# Patient Record
Sex: Female | Born: 1945 | Race: White | Hispanic: No | Marital: Single | Smoking: Never smoker
Health system: Southern US, Community
[De-identification: ages and names within clinical notes are randomized; demographics above are authoritative.]

## PROBLEM LIST (undated history)

## (undated) DIAGNOSIS — I2510 Atherosclerotic heart disease of native coronary artery without angina pectoris: Secondary | ICD-10-CM

## (undated) DIAGNOSIS — I10 Essential (primary) hypertension: Secondary | ICD-10-CM

## (undated) DIAGNOSIS — F411 Generalized anxiety disorder: Secondary | ICD-10-CM

## (undated) DIAGNOSIS — F329 Major depressive disorder, single episode, unspecified: Secondary | ICD-10-CM

## (undated) DIAGNOSIS — G4733 Obstructive sleep apnea (adult) (pediatric): Secondary | ICD-10-CM

## (undated) DIAGNOSIS — N189 Chronic kidney disease, unspecified: Secondary | ICD-10-CM

## (undated) DIAGNOSIS — F4310 Post-traumatic stress disorder, unspecified: Secondary | ICD-10-CM

## (undated) DIAGNOSIS — E669 Obesity, unspecified: Secondary | ICD-10-CM

## (undated) DIAGNOSIS — M1990 Unspecified osteoarthritis, unspecified site: Secondary | ICD-10-CM

## (undated) DIAGNOSIS — Z8659 Personal history of other mental and behavioral disorders: Secondary | ICD-10-CM

## (undated) DIAGNOSIS — N2889 Other specified disorders of kidney and ureter: Secondary | ICD-10-CM

## (undated) DIAGNOSIS — F419 Anxiety disorder, unspecified: Secondary | ICD-10-CM

## (undated) DIAGNOSIS — R0600 Dyspnea, unspecified: Secondary | ICD-10-CM

## (undated) DIAGNOSIS — I493 Ventricular premature depolarization: Secondary | ICD-10-CM

## (undated) DIAGNOSIS — R112 Nausea with vomiting, unspecified: Secondary | ICD-10-CM

## (undated) DIAGNOSIS — Z9289 Personal history of other medical treatment: Secondary | ICD-10-CM

## (undated) DIAGNOSIS — G2581 Restless legs syndrome: Secondary | ICD-10-CM

## (undated) DIAGNOSIS — I4719 Other supraventricular tachycardia: Secondary | ICD-10-CM

## (undated) DIAGNOSIS — Z8742 Personal history of other diseases of the female genital tract: Secondary | ICD-10-CM

## (undated) DIAGNOSIS — R413 Other amnesia: Secondary | ICD-10-CM

## (undated) DIAGNOSIS — N751 Abscess of Bartholin's gland: Secondary | ICD-10-CM

## (undated) DIAGNOSIS — H8120 Vestibular neuronitis, unspecified ear: Secondary | ICD-10-CM

## (undated) DIAGNOSIS — M797 Fibromyalgia: Secondary | ICD-10-CM

## (undated) DIAGNOSIS — I471 Supraventricular tachycardia: Secondary | ICD-10-CM

## (undated) DIAGNOSIS — E039 Hypothyroidism, unspecified: Secondary | ICD-10-CM

## (undated) DIAGNOSIS — R7303 Prediabetes: Secondary | ICD-10-CM

## (undated) DIAGNOSIS — Z8709 Personal history of other diseases of the respiratory system: Secondary | ICD-10-CM

## (undated) DIAGNOSIS — I499 Cardiac arrhythmia, unspecified: Secondary | ICD-10-CM

## (undated) DIAGNOSIS — F32A Depression, unspecified: Secondary | ICD-10-CM

## (undated) DIAGNOSIS — R42 Dizziness and giddiness: Secondary | ICD-10-CM

## (undated) DIAGNOSIS — G43909 Migraine, unspecified, not intractable, without status migrainosus: Secondary | ICD-10-CM

## (undated) DIAGNOSIS — T41205A Adverse effect of unspecified general anesthetics, initial encounter: Secondary | ICD-10-CM

## (undated) DIAGNOSIS — Z9889 Other specified postprocedural states: Secondary | ICD-10-CM

## (undated) DIAGNOSIS — C679 Malignant neoplasm of bladder, unspecified: Secondary | ICD-10-CM

## (undated) DIAGNOSIS — F909 Attention-deficit hyperactivity disorder, unspecified type: Secondary | ICD-10-CM

## (undated) DIAGNOSIS — K219 Gastro-esophageal reflux disease without esophagitis: Secondary | ICD-10-CM

## (undated) DIAGNOSIS — F319 Bipolar disorder, unspecified: Secondary | ICD-10-CM

## (undated) DIAGNOSIS — E785 Hyperlipidemia, unspecified: Secondary | ICD-10-CM

## (undated) HISTORY — PX: TUBAL LIGATION: SHX77

## (undated) HISTORY — DX: Personal history of other diseases of the female genital tract: Z87.42

## (undated) HISTORY — DX: Supraventricular tachycardia: I47.1

## (undated) HISTORY — DX: Atherosclerotic heart disease of native coronary artery without angina pectoris: I25.10

## (undated) HISTORY — PX: CARDIAC ELECTROPHYSIOLOGY MAPPING AND ABLATION: SHX1292

## (undated) HISTORY — DX: Personal history of other medical treatment: Z92.89

## (undated) HISTORY — DX: Personal history of other diseases of the respiratory system: Z87.09

## (undated) HISTORY — DX: Obstructive sleep apnea (adult) (pediatric): G47.33

## (undated) HISTORY — DX: Hyperlipidemia, unspecified: E78.5

## (undated) HISTORY — DX: Major depressive disorder, single episode, unspecified: F32.9

## (undated) HISTORY — DX: Other amnesia: R41.3

## (undated) HISTORY — DX: Other specified disorders of kidney and ureter: N28.89

## (undated) HISTORY — DX: Migraine, unspecified, not intractable, without status migrainosus: G43.909

## (undated) HISTORY — PX: TONSILLECTOMY: SHX5217

## (undated) HISTORY — DX: Ventricular premature depolarization: I49.3

## (undated) HISTORY — DX: Malignant neoplasm of bladder, unspecified: C67.9

## (undated) HISTORY — DX: Vestibular neuronitis, unspecified ear: H81.20

## (undated) HISTORY — DX: Obesity, unspecified: E66.9

## (undated) HISTORY — PX: OTHER SURGICAL HISTORY: SHX169

## (undated) HISTORY — DX: Generalized anxiety disorder: F41.1

## (undated) HISTORY — DX: Restless legs syndrome: G25.81

## (undated) HISTORY — DX: Other supraventricular tachycardia: I47.19

## (undated) HISTORY — DX: Abscess of Bartholin's gland: N75.1

## (undated) HISTORY — DX: Attention-deficit hyperactivity disorder, unspecified type: F90.9

## (undated) HISTORY — DX: Gastro-esophageal reflux disease without esophagitis: K21.9

## (undated) HISTORY — DX: Anxiety disorder, unspecified: F41.9

## (undated) HISTORY — DX: Personal history of other mental and behavioral disorders: Z86.59

---

## 1988-03-22 DIAGNOSIS — E039 Hypothyroidism, unspecified: Secondary | ICD-10-CM

## 1988-03-22 HISTORY — DX: Hypothyroidism, unspecified: E03.9

## 1988-03-22 HISTORY — PX: THYROIDECTOMY: SHX17

## 1997-08-13 ENCOUNTER — Other Ambulatory Visit: Admission: RE | Admit: 1997-08-13 | Discharge: 1997-08-13 | Payer: Self-pay | Admitting: Gastroenterology

## 1997-08-28 ENCOUNTER — Emergency Department (HOSPITAL_COMMUNITY): Admission: EM | Admit: 1997-08-28 | Discharge: 1997-08-28 | Payer: Self-pay | Admitting: Internal Medicine

## 1998-01-09 ENCOUNTER — Encounter: Payer: Self-pay | Admitting: Pulmonary Disease

## 1998-01-09 ENCOUNTER — Inpatient Hospital Stay (HOSPITAL_COMMUNITY): Admission: RE | Admit: 1998-01-09 | Discharge: 1998-01-11 | Payer: Self-pay | Admitting: Internal Medicine

## 1998-01-11 ENCOUNTER — Encounter: Payer: Self-pay | Admitting: Pulmonary Disease

## 1998-05-10 ENCOUNTER — Encounter: Payer: Self-pay | Admitting: Emergency Medicine

## 1998-05-10 ENCOUNTER — Emergency Department (HOSPITAL_COMMUNITY): Admission: EM | Admit: 1998-05-10 | Discharge: 1998-05-11 | Payer: Self-pay | Admitting: Emergency Medicine

## 1998-08-10 ENCOUNTER — Ambulatory Visit: Admission: RE | Admit: 1998-08-10 | Discharge: 1998-08-10 | Payer: Self-pay | Admitting: *Deleted

## 1998-08-10 ENCOUNTER — Encounter: Payer: Self-pay | Admitting: Internal Medicine

## 1999-02-27 ENCOUNTER — Encounter (INDEPENDENT_AMBULATORY_CARE_PROVIDER_SITE_OTHER): Payer: Self-pay | Admitting: *Deleted

## 1999-02-27 ENCOUNTER — Ambulatory Visit (HOSPITAL_COMMUNITY): Admission: RE | Admit: 1999-02-27 | Discharge: 1999-02-28 | Payer: Self-pay | Admitting: *Deleted

## 1999-08-22 ENCOUNTER — Encounter: Admission: RE | Admit: 1999-08-22 | Discharge: 1999-08-22 | Payer: Self-pay | Admitting: Family Medicine

## 1999-08-22 ENCOUNTER — Encounter: Payer: Self-pay | Admitting: Family Medicine

## 1999-08-29 ENCOUNTER — Encounter: Payer: Self-pay | Admitting: Pulmonary Disease

## 1999-08-29 ENCOUNTER — Ambulatory Visit (HOSPITAL_COMMUNITY): Admission: RE | Admit: 1999-08-29 | Discharge: 1999-08-29 | Payer: Self-pay | Admitting: Pulmonary Disease

## 1999-09-03 ENCOUNTER — Encounter: Payer: Self-pay | Admitting: Pulmonary Disease

## 1999-09-03 ENCOUNTER — Encounter: Admission: RE | Admit: 1999-09-03 | Discharge: 1999-09-03 | Payer: Self-pay | Admitting: Pulmonary Disease

## 1999-09-04 ENCOUNTER — Encounter: Payer: Self-pay | Admitting: Pulmonary Disease

## 1999-10-27 ENCOUNTER — Other Ambulatory Visit: Admission: RE | Admit: 1999-10-27 | Discharge: 1999-10-27 | Payer: Self-pay | Admitting: Obstetrics and Gynecology

## 2000-07-13 ENCOUNTER — Ambulatory Visit (HOSPITAL_COMMUNITY): Admission: RE | Admit: 2000-07-13 | Discharge: 2000-07-13 | Payer: Self-pay | Admitting: Gastroenterology

## 2000-07-13 ENCOUNTER — Encounter (INDEPENDENT_AMBULATORY_CARE_PROVIDER_SITE_OTHER): Payer: Self-pay | Admitting: *Deleted

## 2000-07-13 ENCOUNTER — Encounter (INDEPENDENT_AMBULATORY_CARE_PROVIDER_SITE_OTHER): Payer: Self-pay | Admitting: Specialist

## 2000-11-25 ENCOUNTER — Other Ambulatory Visit: Admission: RE | Admit: 2000-11-25 | Discharge: 2000-11-25 | Payer: Self-pay | Admitting: *Deleted

## 2001-11-04 ENCOUNTER — Inpatient Hospital Stay (HOSPITAL_COMMUNITY): Admission: EM | Admit: 2001-11-04 | Discharge: 2001-11-05 | Payer: Self-pay | Admitting: Emergency Medicine

## 2001-11-04 ENCOUNTER — Encounter: Payer: Self-pay | Admitting: Emergency Medicine

## 2001-11-05 ENCOUNTER — Encounter: Payer: Self-pay | Admitting: Cardiology

## 2001-11-13 ENCOUNTER — Encounter: Admission: RE | Admit: 2001-11-13 | Discharge: 2001-11-13 | Payer: Self-pay | Admitting: Family Medicine

## 2001-11-13 ENCOUNTER — Encounter: Payer: Self-pay | Admitting: Family Medicine

## 2001-11-22 ENCOUNTER — Encounter: Payer: Self-pay | Admitting: Family Medicine

## 2001-11-22 ENCOUNTER — Encounter: Admission: RE | Admit: 2001-11-22 | Discharge: 2001-11-22 | Payer: Self-pay | Admitting: Family Medicine

## 2002-01-11 ENCOUNTER — Encounter: Payer: Self-pay | Admitting: Gastroenterology

## 2002-01-11 ENCOUNTER — Ambulatory Visit (HOSPITAL_COMMUNITY): Admission: RE | Admit: 2002-01-11 | Discharge: 2002-01-11 | Payer: Self-pay | Admitting: Gastroenterology

## 2002-02-01 ENCOUNTER — Encounter: Admission: RE | Admit: 2002-02-01 | Discharge: 2002-02-01 | Payer: Self-pay | Admitting: Gastroenterology

## 2002-02-01 ENCOUNTER — Encounter: Payer: Self-pay | Admitting: Gastroenterology

## 2002-05-14 ENCOUNTER — Other Ambulatory Visit: Admission: RE | Admit: 2002-05-14 | Discharge: 2002-05-14 | Payer: Self-pay | Admitting: Obstetrics and Gynecology

## 2003-02-27 ENCOUNTER — Ambulatory Visit (HOSPITAL_BASED_OUTPATIENT_CLINIC_OR_DEPARTMENT_OTHER): Admission: RE | Admit: 2003-02-27 | Discharge: 2003-02-27 | Payer: Self-pay | Admitting: Pulmonary Disease

## 2003-02-27 ENCOUNTER — Encounter: Payer: Self-pay | Admitting: Pulmonary Disease

## 2003-07-11 ENCOUNTER — Other Ambulatory Visit: Admission: RE | Admit: 2003-07-11 | Discharge: 2003-07-11 | Payer: Self-pay | Admitting: Obstetrics and Gynecology

## 2003-08-16 ENCOUNTER — Emergency Department (HOSPITAL_COMMUNITY): Admission: EM | Admit: 2003-08-16 | Discharge: 2003-08-17 | Payer: Self-pay | Admitting: Emergency Medicine

## 2003-08-17 ENCOUNTER — Inpatient Hospital Stay (HOSPITAL_COMMUNITY): Admission: RE | Admit: 2003-08-17 | Discharge: 2003-08-25 | Payer: Self-pay | Admitting: Psychiatry

## 2004-07-15 ENCOUNTER — Encounter: Admission: RE | Admit: 2004-07-15 | Discharge: 2004-07-15 | Payer: Self-pay | Admitting: Gastroenterology

## 2004-09-14 ENCOUNTER — Inpatient Hospital Stay (HOSPITAL_COMMUNITY): Admission: RE | Admit: 2004-09-14 | Discharge: 2004-09-22 | Payer: Self-pay | Admitting: Psychiatry

## 2004-09-14 ENCOUNTER — Ambulatory Visit: Payer: Self-pay | Admitting: Psychiatry

## 2004-09-24 ENCOUNTER — Other Ambulatory Visit (HOSPITAL_COMMUNITY): Admission: RE | Admit: 2004-09-24 | Discharge: 2004-10-07 | Payer: Self-pay | Admitting: Psychiatry

## 2004-11-16 ENCOUNTER — Other Ambulatory Visit: Admission: RE | Admit: 2004-11-16 | Discharge: 2004-11-16 | Payer: Self-pay | Admitting: Obstetrics and Gynecology

## 2004-12-18 ENCOUNTER — Emergency Department (HOSPITAL_COMMUNITY): Admission: EM | Admit: 2004-12-18 | Discharge: 2004-12-18 | Payer: Self-pay | Admitting: Emergency Medicine

## 2005-01-04 ENCOUNTER — Ambulatory Visit: Payer: Self-pay | Admitting: Pulmonary Disease

## 2005-01-28 ENCOUNTER — Emergency Department (HOSPITAL_COMMUNITY): Admission: EM | Admit: 2005-01-28 | Discharge: 2005-01-28 | Payer: Self-pay | Admitting: Emergency Medicine

## 2005-03-16 ENCOUNTER — Emergency Department (HOSPITAL_COMMUNITY): Admission: EM | Admit: 2005-03-16 | Discharge: 2005-03-16 | Payer: Self-pay | Admitting: Emergency Medicine

## 2005-04-12 ENCOUNTER — Inpatient Hospital Stay (HOSPITAL_COMMUNITY): Admission: RE | Admit: 2005-04-12 | Discharge: 2005-04-20 | Payer: Self-pay | Admitting: Psychiatry

## 2005-04-13 ENCOUNTER — Ambulatory Visit: Payer: Self-pay | Admitting: Psychiatry

## 2005-04-21 ENCOUNTER — Other Ambulatory Visit (HOSPITAL_COMMUNITY): Admission: RE | Admit: 2005-04-21 | Discharge: 2005-07-20 | Payer: Self-pay | Admitting: Psychiatry

## 2005-06-17 ENCOUNTER — Ambulatory Visit: Payer: Self-pay | Admitting: Pulmonary Disease

## 2005-07-21 ENCOUNTER — Encounter: Payer: Self-pay | Admitting: Cardiology

## 2006-02-19 ENCOUNTER — Emergency Department (HOSPITAL_COMMUNITY): Admission: EM | Admit: 2006-02-19 | Discharge: 2006-02-19 | Payer: Self-pay | Admitting: Emergency Medicine

## 2006-08-14 ENCOUNTER — Emergency Department (HOSPITAL_COMMUNITY): Admission: EM | Admit: 2006-08-14 | Discharge: 2006-08-14 | Payer: Self-pay | Admitting: Emergency Medicine

## 2007-02-20 DIAGNOSIS — G2581 Restless legs syndrome: Secondary | ICD-10-CM

## 2007-02-20 DIAGNOSIS — N809 Endometriosis, unspecified: Secondary | ICD-10-CM | POA: Insufficient documentation

## 2007-02-20 DIAGNOSIS — G4733 Obstructive sleep apnea (adult) (pediatric): Secondary | ICD-10-CM

## 2007-02-20 DIAGNOSIS — F3289 Other specified depressive episodes: Secondary | ICD-10-CM | POA: Insufficient documentation

## 2007-02-20 DIAGNOSIS — F988 Other specified behavioral and emotional disorders with onset usually occurring in childhood and adolescence: Secondary | ICD-10-CM | POA: Insufficient documentation

## 2007-02-20 DIAGNOSIS — F329 Major depressive disorder, single episode, unspecified: Secondary | ICD-10-CM | POA: Insufficient documentation

## 2007-02-20 DIAGNOSIS — N751 Abscess of Bartholin's gland: Secondary | ICD-10-CM | POA: Insufficient documentation

## 2007-02-20 HISTORY — DX: Obstructive sleep apnea (adult) (pediatric): G47.33

## 2007-02-20 HISTORY — DX: Restless legs syndrome: G25.81

## 2007-02-27 ENCOUNTER — Ambulatory Visit: Payer: Self-pay | Admitting: Pulmonary Disease

## 2007-04-04 ENCOUNTER — Other Ambulatory Visit: Admission: RE | Admit: 2007-04-04 | Discharge: 2007-04-04 | Payer: Self-pay | Admitting: Obstetrics & Gynecology

## 2007-09-19 ENCOUNTER — Other Ambulatory Visit (HOSPITAL_COMMUNITY): Admission: RE | Admit: 2007-09-19 | Discharge: 2007-12-18 | Payer: Self-pay | Admitting: Psychiatry

## 2007-12-21 ENCOUNTER — Telehealth: Payer: Self-pay | Admitting: Pulmonary Disease

## 2008-01-09 ENCOUNTER — Ambulatory Visit: Payer: Self-pay | Admitting: Pulmonary Disease

## 2008-01-11 ENCOUNTER — Telehealth: Payer: Self-pay | Admitting: Pulmonary Disease

## 2008-02-24 ENCOUNTER — Encounter: Payer: Self-pay | Admitting: Pulmonary Disease

## 2008-07-08 ENCOUNTER — Telehealth: Payer: Self-pay | Admitting: Pulmonary Disease

## 2008-07-08 ENCOUNTER — Ambulatory Visit: Payer: Self-pay | Admitting: Pulmonary Disease

## 2008-11-08 ENCOUNTER — Emergency Department (HOSPITAL_COMMUNITY): Admission: EM | Admit: 2008-11-08 | Discharge: 2008-11-08 | Payer: Self-pay | Admitting: Emergency Medicine

## 2009-03-29 ENCOUNTER — Encounter: Payer: Self-pay | Admitting: Pulmonary Disease

## 2009-07-17 ENCOUNTER — Telehealth (INDEPENDENT_AMBULATORY_CARE_PROVIDER_SITE_OTHER): Payer: Self-pay | Admitting: *Deleted

## 2009-07-23 ENCOUNTER — Ambulatory Visit: Payer: Self-pay | Admitting: Pulmonary Disease

## 2009-09-05 ENCOUNTER — Telehealth (INDEPENDENT_AMBULATORY_CARE_PROVIDER_SITE_OTHER): Payer: Self-pay | Admitting: *Deleted

## 2009-09-16 ENCOUNTER — Ambulatory Visit: Payer: Self-pay | Admitting: Pulmonary Disease

## 2009-09-20 ENCOUNTER — Emergency Department (HOSPITAL_COMMUNITY): Admission: EM | Admit: 2009-09-20 | Discharge: 2009-09-21 | Payer: Self-pay | Admitting: Emergency Medicine

## 2009-09-20 ENCOUNTER — Encounter: Payer: Self-pay | Admitting: Pulmonary Disease

## 2009-09-24 ENCOUNTER — Telehealth (INDEPENDENT_AMBULATORY_CARE_PROVIDER_SITE_OTHER): Payer: Self-pay | Admitting: *Deleted

## 2009-09-26 ENCOUNTER — Telehealth: Payer: Self-pay | Admitting: Pulmonary Disease

## 2009-11-12 ENCOUNTER — Ambulatory Visit: Payer: Self-pay | Admitting: Pulmonary Disease

## 2009-11-12 DIAGNOSIS — R930 Abnormal findings on diagnostic imaging of skull and head, not elsewhere classified: Secondary | ICD-10-CM | POA: Insufficient documentation

## 2009-11-26 ENCOUNTER — Ambulatory Visit: Payer: Self-pay | Admitting: Cardiovascular Disease

## 2009-11-26 ENCOUNTER — Ambulatory Visit: Payer: Self-pay

## 2009-11-26 ENCOUNTER — Encounter: Payer: Self-pay | Admitting: Pulmonary Disease

## 2009-11-26 ENCOUNTER — Ambulatory Visit (HOSPITAL_COMMUNITY): Admission: RE | Admit: 2009-11-26 | Discharge: 2009-11-26 | Payer: Self-pay | Admitting: Pulmonary Disease

## 2009-12-22 ENCOUNTER — Encounter (INDEPENDENT_AMBULATORY_CARE_PROVIDER_SITE_OTHER): Payer: Self-pay | Admitting: *Deleted

## 2009-12-22 ENCOUNTER — Ambulatory Visit: Payer: Self-pay | Admitting: Pulmonary Disease

## 2009-12-23 ENCOUNTER — Ambulatory Visit: Payer: Self-pay | Admitting: Gastroenterology

## 2009-12-23 ENCOUNTER — Emergency Department (HOSPITAL_COMMUNITY): Admission: EM | Admit: 2009-12-23 | Discharge: 2009-12-23 | Payer: Self-pay | Admitting: Emergency Medicine

## 2009-12-29 DIAGNOSIS — R112 Nausea with vomiting, unspecified: Secondary | ICD-10-CM | POA: Insufficient documentation

## 2009-12-29 DIAGNOSIS — R109 Unspecified abdominal pain: Secondary | ICD-10-CM | POA: Insufficient documentation

## 2009-12-30 ENCOUNTER — Ambulatory Visit (HOSPITAL_COMMUNITY): Admission: RE | Admit: 2009-12-30 | Discharge: 2009-12-30 | Payer: Self-pay | Admitting: Gastroenterology

## 2009-12-31 DIAGNOSIS — R9389 Abnormal findings on diagnostic imaging of other specified body structures: Secondary | ICD-10-CM | POA: Insufficient documentation

## 2009-12-31 DIAGNOSIS — K8020 Calculus of gallbladder without cholecystitis without obstruction: Secondary | ICD-10-CM

## 2009-12-31 HISTORY — DX: Calculus of gallbladder without cholecystitis without obstruction: K80.20

## 2010-01-02 ENCOUNTER — Ambulatory Visit: Payer: Self-pay | Admitting: Gastroenterology

## 2010-01-07 LAB — CONVERTED CEMR LAB
ALT: 31 units/L (ref 0–35)
AST: 35 units/L (ref 0–37)
Albumin: 4.3 g/dL (ref 3.5–5.2)
Alkaline Phosphatase: 74 units/L (ref 39–117)
BUN: 24 mg/dL — ABNORMAL HIGH (ref 6–23)
Basophils Absolute: 0 10*3/uL (ref 0.0–0.1)
Basophils Relative: 0.4 % (ref 0.0–3.0)
CO2: 29 meq/L (ref 19–32)
Calcium: 9.7 mg/dL (ref 8.4–10.5)
Chloride: 102 meq/L (ref 96–112)
Creatinine, Ser: 1 mg/dL (ref 0.4–1.2)
Eosinophils Absolute: 0.1 10*3/uL (ref 0.0–0.7)
Eosinophils Relative: 2.8 % (ref 0.0–5.0)
GFR calc non Af Amer: 56.55 mL/min (ref >60)
Glucose, Bld: 121 mg/dL — ABNORMAL HIGH (ref 70–99)
HCT: 39.6 % (ref 36.0–46.0)
Hemoglobin: 13.7 g/dL (ref 12.0–15.0)
Lymphocytes Relative: 29 % (ref 12.0–46.0)
Lymphs Abs: 1.5 10*3/uL (ref 0.7–4.0)
MCHC: 34.6 g/dL (ref 30.0–36.0)
MCV: 85.3 fL (ref 78.0–100.0)
Monocytes Absolute: 0.3 10*3/uL (ref 0.1–1.0)
Monocytes Relative: 6.2 % (ref 3.0–12.0)
Neutro Abs: 3.3 10*3/uL (ref 1.4–7.7)
Neutrophils Relative %: 61.6 % (ref 43.0–77.0)
Platelets: 237 10*3/uL (ref 150.0–400.0)
Potassium: 3.8 meq/L (ref 3.5–5.1)
RBC: 4.64 M/uL (ref 3.87–5.11)
RDW: 13.6 % (ref 11.5–14.6)
Sodium: 140 meq/L (ref 135–145)
Total Bilirubin: 0.5 mg/dL (ref 0.3–1.2)
Total Protein: 7.1 g/dL (ref 6.0–8.3)
WBC: 5.3 10*3/uL (ref 4.5–10.5)

## 2010-01-13 ENCOUNTER — Encounter: Payer: Self-pay | Admitting: Gastroenterology

## 2010-01-15 ENCOUNTER — Ambulatory Visit (HOSPITAL_COMMUNITY): Admission: RE | Admit: 2010-01-15 | Discharge: 2010-01-15 | Payer: Self-pay | Admitting: Urology

## 2010-01-27 ENCOUNTER — Encounter: Payer: Self-pay | Admitting: Gastroenterology

## 2010-02-11 ENCOUNTER — Ambulatory Visit (HOSPITAL_COMMUNITY)
Admission: RE | Admit: 2010-02-11 | Discharge: 2010-02-11 | Payer: Self-pay | Source: Home / Self Care | Admitting: Family Medicine

## 2010-02-13 ENCOUNTER — Emergency Department (HOSPITAL_COMMUNITY): Admission: EM | Admit: 2010-02-13 | Discharge: 2010-02-14 | Payer: Self-pay | Admitting: Emergency Medicine

## 2010-02-14 ENCOUNTER — Inpatient Hospital Stay (HOSPITAL_COMMUNITY)
Admission: AD | Admit: 2010-02-14 | Discharge: 2010-03-04 | Payer: Self-pay | Source: Home / Self Care | Attending: Psychiatry | Admitting: Psychiatry

## 2010-02-14 ENCOUNTER — Ambulatory Visit: Payer: Self-pay | Admitting: Psychiatry

## 2010-02-23 ENCOUNTER — Ambulatory Visit (HOSPITAL_COMMUNITY)
Admission: AD | Admit: 2010-02-23 | Discharge: 2010-02-23 | Disposition: A | Discharge status: Other Acute Inpatient Hospital | Payer: Self-pay | Source: Home / Self Care | Admitting: Obstetrics and Gynecology

## 2010-04-11 ENCOUNTER — Encounter: Payer: Self-pay | Admitting: Family Medicine

## 2010-04-23 NOTE — Assessment & Plan Note (Signed)
Summary: f/u sleep and rls   Vital Signs:  Patient Profile:   65 Years Old Female Height:     65 inches Weight:      220.38 pounds O2 Sat:      99 % Temp:     98.0 degrees F oral Pulse rate:   76 / minute BP sitting:   136 / 86  (left arm)  Vitals Entered By: Cyndia Diver LPN (February 27, 2007 3:24 PM) Oxygen therapy Oxygen             Comments pt needs rx for mirapex.  pt would also like to restart using her cpap again.     Chief Complaint:  follow up.  History of Present Illness: the patient comes in today for follow-up of her sleep apnea.  The patient had a titration study done approximately 1 1/2 years ago that showed an optimal CPAP pressure of 9 cm of water.  The patient's pressure was increased to that level and she was asked to follow up in roughly 6 weeks.  The patient was obviously lost to follow-up and comes in today year and half later for follow-up.the patient has not been wearing her CPAP device and feels that her sleep and daytime alertness has suffered because of it.  She wishes to get back on CPAP and needs new supplies, mask, and reestablishment with her durable medical company.    Current Allergies: ! COMPAZINE ! SULFA ! TALWIN ! TORADOL      Physical Exam  General:     in general, she is an overweight female in no acute distress Nose:     there is no evidence of skin breakdown of pressure necrosis from prior CPAP use    Impression & Recommendations:  Problem # 1:  RESTLESS LEGS SYNDROME (ICD-333.94)  Orders: Est. Patient Level III (47829) the patient has been on Mirapex for her restless leg syndrome and has been doing quite well whenever she takes this.  She wishes to stay on this medication.  Problem # 2:  OBSTRUCTIVE SLEEP APNEA (ICD-327.23)  Orders: Est. Patient Level III (56213) Pulmonary Referral (Pulmonary) the patient wishes to get back on her CPAP machine and try and reestablish some type of regular pattern with its use.  I will  go ahead and give her to a DM, E. for a new mask and  supplies.the patient will follow-up in approximately 8 weeks after she has restarted her CPAP device.  She is to call me if she has any difficulties with its use.  Medications Added to Medication List This Visit: 1)  Lamictal 100 Mg Tabs (Lamotrigine) .... Take 4 tabs (400mg ) by mouth once daily 2)  Trazodone Hcl 50 Mg Tabs (Trazodone hcl) .... Take 1 to 2 tabs by mouth once daily 3)  Deplin 7.5 Mg Tabs (L-methylfolate) .... Take one tab by mouth once daily   Patient Instructions: 1)  Please schedule a follow-up appointment in 2 months.    Prescriptions: MIRAPEX 1 MG  TABS (PRAMIPEXOLE DIHYDROCHLORIDE) take one tab by mouth at bedtime  #30 x 6   Entered and Authorized by:   Barbaraann Share MD   Signed by:   Barbaraann Share MD on 02/27/2007   Method used:   [facility] to ...       [facility]  [address]  [id number]       [address]       [facility]  [place], [place]  [address]       Ph: [phone] or [phone]       Fax: [phone]   RxID:   [email or web address]  ]

## 2010-04-23 NOTE — Progress Notes (Signed)
Summary: nos appt  Phone Note Call from Patient   Caller: [email or web address]  Call For: Edis Huish Summary of Call: Rsc nos from 7/7 to 7/20 @ 9:45a. Initial call taken by: Darletta Moll,  September 26, 2009 9:43 AM

## 2010-04-23 NOTE — Miscellaneous (Signed)
Summary: autodownload shows poor compliance.  Clinical Lists Changes  Orders: Added new Referral order of DME Referral (DME) - Signed auto shows poor compliance with 3/28 days wiht use over 4 hours. not able to optimize pressure

## 2010-04-23 NOTE — Letter (Signed)
Summary: [facility]  [facility]   Imported By: Lester Boykins 01/23/2010 09:06:44  _____________________________________________________________________  External Attachment:    Type:   Image     Comment:   External Document

## 2010-04-23 NOTE — Progress Notes (Signed)
Summary: rx request  Phone Note Call from Patient Call back at Home Phone [phone]   Caller: Patient Call For: clance Summary of Call: pt wants to know if there is a cheaper sub for rx mirapex (for pt's restless legs). if so, [facility] [address] [phone].  Initial call taken by: Tivis Ringer, CNA,  September 05, 2009 10:06 AM  Follow-up for Phone Call        Spoke with pt.  Pt would like to know if mirapex can be substituted for something cheaper.  Pt would also like KC to know she believes the seroquel is causing the restless leg.  Will forward message to KC-pls advise.  Thanks! Gweneth Dimitri RN  September 05, 2009 10:42 AM   Additional Follow-up for Phone Call Additional follow up Details #1::        can try her on requip instead of mirapex 0.5mg  1-2 after dinner each night, one month supply with [employer]fills. Seroquel is not on list for being a big culprit, but it can worsen the symptoms under occasional circumstances. Additional Follow-up by: Barbaraann Share MD,  September 05, 2009 5:08 PM    Additional Follow-up for Phone Call Additional follow up Details #2::    Spoke with pt.  Pt informed of above recs per [facility] and aware requip rx sent to [facility] [address].  She verbalized understanding. Gweneth Dimitri RN  September 05, 2009 5:14 PM   New/Updated Medications: REQUIP 0.5 MG TABS (ROPINIROLE HCL) take 1-2 after dinner each night Prescriptions: REQUIP 0.5 MG TABS (ROPINIROLE HCL) take 1-2 after dinner each night  #60 x 6   Entered by:   Gweneth Dimitri RN   Authorized by:   Barbaraann Share MD   Signed by:   Gweneth Dimitri RN on 09/05/2009   Method used:   [facility] to        [facility] [address] [id number]       [address]       [place], [place]  [address]       Ph: [phone] or [phone]       Fax: [phone]   RxID:   [id number]

## 2010-04-23 NOTE — Letter (Signed)
Summary: New Patient letter  [facility]  [address] [place], [place] [address]   Phone: [phone]  Fax: [phone]       12/22/2009 MRN: [id number]  [facility] [address] [place], [place]  [address]  Dear Ms. Perkey,  Welcome to the Gastroenterology Division at [facility].    You are scheduled to see Dr.  Christella Hartigan  on 12/23/09 at 3:00 pm on the 3rd floor at [facility], [address]  We ask that you try to arrive at our office 15 minutes prior to your appointment time to allow for check-in.  We would like you to complete the enclosed self-administered evaluation form prior to your visit and bring it with you on the day of your appointment.  We will review it with you.  Also, please bring a complete list of all your medications or, if you prefer, bring the medication bottles and we will list them.  Please bring your insurance card so that we may make a copy of it.  If your insurance requires a referral to see a specialist, please bring your referral form from your primary care physician.  Co-payments are due at the time of your visit and may be paid by cash, check or credit card.     Your office visit will consist of a consult with your physician (includes a physical exam), any laboratory testing he/she may order, scheduling of any necessary diagnostic testing (e.g. x-ray, ultrasound, CT-scan), and scheduling of a procedure (e.g. Endoscopy, Colonoscopy) if required.  Please allow enough time on your schedule to allow for any/all of these possibilities.    If you cannot keep your appointment, please call [phone] to cancel or reschedule prior to your appointment date.  This allows [place] the opportunity to schedule an appointment for another patient in need of care.  If you do not cancel or reschedule by 5 p.m. the business day prior to your appointment date, you will be charged a $50.00 late cancellation/no-show fee.    Thank you for choosing  [facility] for your medical needs.  We appreciate the opportunity to care for you.  Please visit [place] at our website  to learn more about our practice.                     Sincerely,                                                             The Gastroenterology Division

## 2010-04-23 NOTE — Progress Notes (Signed)
Summary: cpap  Phone Note Call from Patient Call back at Home Phone [phone]   Caller: [email or web address]  Call For: clance Summary of Call: went to pick up auto cpap card and pt stated to them that she is not going to repeat the auto nor is she going to use her cpap anymore  Initial call taken by: Oneita Jolly,  September 24, 2009 11:10 AM  Follow-up for Phone Call        please let pt know that I understand, and to work hard on getting her weight down.  If she wishes to consider other options that are not as good as cpap, but will help....let me know. Follow-up by: Barbaraann Share MD,  September 24, 2009 5:12 PM  Additional Follow-up for Phone Call Additional follow up Details #1::        LMOMTCB x 1. Zackery Barefoot CMA  September 25, 2009 8:58 AM   pt returned call to Triage Nurse. Additional Follow-up by: Eugene Gavia,  September 25, 2009 10:43 AM    Additional Follow-up for Phone Call Additional follow up Details #2::    Spoke with pt- was in hosptial on Saturday; was told had PE; would like to follow up with [facility]; appt has been set up for 09-26-09 with KC.Phone note done. See phone message above for more details.Reynaldo Minium CMA  September 25, 2009 11:33 AM

## 2010-04-23 NOTE — Op Note (Signed)
Summary: [place]  [place]   Imported By: Sherian Rein 11/13/2009 07:12:00  _____________________________________________________________________  External Attachment:    Type:   Image     Comment:   External Document

## 2010-04-23 NOTE — Letter (Signed)
Summary: [place]  [place]   Imported By: Sherian Rein 11/13/2009 07:10:16  _____________________________________________________________________  External Attachment:    Type:   Image     Comment:   External Document

## 2010-04-23 NOTE — Assessment & Plan Note (Signed)
Summary: rov for osa/rls.   CC:  Pt is here for a 6 month f/u appt.  Pt states she is using her cpap machine 4 nights per week . Approx 4 hrs per night.  Pt denied any problems with mask but wonders if pressure may need to be increased.  Pt states Mirapex is helping with RLS. Marland Kitchen  History of Present Illness: The pt comes in today for f/u of her RLS and OSA.  She is doing well with the mirapex, and takes the med only on an as needed basis.  With regards to her sleep apnea, she is not totally compliant with the device, but wears about 5+ days a week.  We have never gotten her pressure optimized, and this needs to be done.  She thinks the mask fit with the nasal pillows may be the issue.  She denies any significant sleepiness issues during the day, and feels that she is fairly well rested.  She has lost 11 pounds since her last visit.  Current Medications (verified): 1)  Lamictal 100 Mg  Tabs (Lamotrigine) .... Take 3 Tabs (300mg ) By Mouth Daily 2)  Cymbalta 30 Mg Cpep (Duloxetine Hcl) .... Take 1 Tablet By Mouth Once A Day 3)  Synthroid 75 Mcg  Tabs (Levothyroxine Sodium) .... Take One Tab By Mouth Once Daily 4)  Mirapex 1 Mg  Tabs (Pramipexole Dihydrochloride) .... Take One Tab By Mouth At Bedtime 5)  Trazodone Hcl 100 Mg Tabs (Trazodone Hcl) .... Take 1 To 1 1/2 By Mouth Daily 6)  Abilify 1 Mg Tabs .... Take 1 Tablet By Mouth Once A Day  Allergies (verified): 1)  ! Compazine 2)  ! Sulfa 3)  ! Talwin 4)  ! Toradol  Review of Systems      See HPI  Vital Signs:  Patient profile:   65 year old female Weight:      220.13 pounds O2 Sat:      98 % Temp:     98.0 degrees F oral Pulse rate:   83 / minute BP sitting:   118 / 68  (left arm) Cuff size:   regular  Vitals Entered By: Arman Filter LPN (July 08, 2008 9:40 AM)  O2 Sat on room air at rest %:  98 CC: Pt is here for a 6 month f/u appt.  Pt states she is using her cpap machine 4 nights per week . Approx 4 hrs per night.  Pt denied  any problems with mask but wonders if pressure may need to be increased.  Pt states Mirapex is helping with RLS.  Comments Medications reviewed with patient Arman Filter LPN  July 08, 2008 9:40 AM    Physical Exam  General:  ow female in nad Nose:  no skin breakdown or pressure necrosis from the cpap mask   Impression & Recommendations:  Problem # 1:  RESTLESS LEGS SYNDROME (ICD-333.94) doing well on current meds.  No change.  Problem # 2:  OBSTRUCTIVE SLEEP APNEA (ICD-327.23) the pt is doing ok with cpap, but thinks the nasal pillows may be the limiting factor in improving compliance.  We will get the dme to work with her on different nasal pillows and also different types of nasal masks.  We still need to optimize her pressure with an auto device.  The pt has lost weight since the last visit, and I have encouraged her to continue.   Medications Added to Medication List This Visit: 1)  Lamictal 100 Mg Tabs (  Lamotrigine) .... Take 3 tabs (300mg ) by mouth daily 2)  Cymbalta 30 Mg Cpep (Duloxetine hcl) .... Take 1 tablet by mouth once a day 3)  Trazodone Hcl 100 Mg Tabs (Trazodone hcl) .... Take 1 to 1 1/2 by mouth daily 4)  Abilify 1 Mg Tabs  .... Take 1 tablet by mouth once a day  Other Orders: Est. Patient Level III (56213) DME Referral (DME)  Patient Instructions: 1)  continue to work on weight loss 2)  will have your dme show you different types of masks 3)  still need to optimize your pressure..will try again with the auto machine.   4)  Please schedule a follow-up appointment in 6 months.

## 2010-04-23 NOTE — Letter (Signed)
[facility]   Imported By: Sherian Rein 02/06/2010 15:05:31  _____________________________________________________________________  External Attachment:    Type:   Image     Comment:   External Document

## 2010-04-23 NOTE — Miscellaneous (Signed)
Summary: poor compliance with auto  Clinical Lists Changes  auto shows optimal pressure of 9cm, very poor compliance...not enough time on machine for adequate data

## 2010-04-23 NOTE — Progress Notes (Signed)
Summary: needs HFU w/ kc- new consult for pulm   Phone Note Call from Patient Call back at Home Phone [phone]   Caller: Patient Call For: clance Summary of Call: pt needs HFU w/ kc for pleural effusion. does kc see pt for pulm? (i only saw sleep appts) please advise and i will call pt back and make this appt-let me know if this will be a consult or not. thanks Initial call taken by: Tivis Ringer, CNA,  September 24, 2009 12:11 PM  Follow-up for Phone Call        ok to see kc for pulmonary but she needs to know this will be a new consult and the 2 problems can not be addressed at the same visit--they will have to be discussed and treated at 2 seperate visits--ok to schedule for appt Follow-up by: Philipp Deputy CMA,  September 24, 2009 12:44 PM  Additional Follow-up for Phone Call Additional follow up Details #1::        I CALLED PT BACK AND SCHEDULED PULM CONSULT W/ KC. PT UNDERSTANDS THAT SHE CAN  ONLY ADDRESS PULM ISSUES AT THIS VISIT AND WILL NEED TO SCHED SEPARATE APPTS. FOR SLEEP ISSUES. PT ALSO UNDERSTANDS THAT THIS IS A NEW CONSULT APPT. Tivis Ringer, CNA  September 24, 2009 2:29 PM
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
## 2010-04-23 NOTE — Progress Notes (Signed)
Summary: Mirapex refill  Phone Note Call from Patient   Caller: Kalee Call For: kc Summary of Call: forgot to ask today for mirapex for rls and said her foot really bothers her more thab anything [phone] Initial call taken by: Oneita Jolly,  July 08, 2008 10:05 AM  Follow-up for Phone Call        ok to refill mirapex Follow-up by: Barbaraann Share MD,  July 08, 2008 10:14 AM  Additional Follow-up for Phone Call Additional follow up Details #1::        Mirapex refill sent to pharmacy.  Called spoke with pt.  Advised rx sent to pharmacy. Additional Follow-up by: Cloyde Reams RN,  July 08, 2008 10:31 AM      Prescriptions: MIRAPEX 1 MG  TABS (PRAMIPEXOLE DIHYDROCHLORIDE) take one tab by mouth at bedtime  #30 x 5   Entered by:   Cloyde Reams RN   Authorized by:   Barbaraann Share MD   Signed by:   Cloyde Reams RN on 07/08/2008   Method used:   [facility] [id number]       [address]       [place], [place]  [address]       Ph: [phone] or [phone]       Fax: [phone]   RxID:   [id number]

## 2010-04-23 NOTE — Assessment & Plan Note (Signed)
Summary: rov for osa/rls   Chief Complaint:  Sleep follow-up. Pt states she is not sleeping as well on CPAP. She is waking up 4 times nightly or more.Marland Kitchen  History of Present Illness: the pt comes in today for f/u of her osa.  She was supposed to f/u in february, but never returned.  She is doing well with the cpap mask, and is wearing compliantly, but doesn't feel that she is resting appropriately.  She denies any issues with her RLS, and feels that her dopamine agonist is working very well.     Prior Medications Reviewed Using: Patient Recall  Current Allergies (reviewed today): ! COMPAZINE ! SULFA ! TALWIN ! TORADOL     Review of Systems      See HPI   Vital Signs:  Patient Profile:   65 Years Old Female Height:     65 inches Weight:      230.8 pounds O2 Sat:      98 % O2 treatment:    Room Air Temp:     98.3 degrees F oral Pulse rate:   82 / minute BP sitting:   120 / 78  (left arm) Cuff size:   regular  Vitals Entered By: Michel Bickers CMA (January 09, 2008 10:25 AM)                 Physical Exam  General:     obese female in nad Nose:     no skin breakdown or pressure necrosis from cpap mask      Impression & Recommendations:  Problem # 1:  RESTLESS LEGS SYNDROME (ICD-333.94) doing well on her current dopamine agonist.  Problem # 2:  OBSTRUCTIVE SLEEP APNEA (ICD-327.23) the pt never returned for her f/u visit to get her pressure optimized.  We will go ahead and get an auto device for her to use to accomplish this.  I also encouraged her to work on weight loss.  Medications Added to Medication List This Visit: 1)  Cerefolin Nac 5.6-2-600 Mg Tabs (Methylfol-methylcob-acetylcyst) .Marland Kitchen.. 1 by mouth daily   Patient Instructions: 1)  will get you an autotitrating device to optimize your pressure.  will let you know the results. 2)  stay on medicine for restless legs 3)  work on weight loss. 4)  f/u in 6mos   ]

## 2010-04-23 NOTE — Assessment & Plan Note (Signed)
Summary: rov for osa, rls.   Visit Type:  Follow-up Primary Provider/Referring Provider:  Duane Lope  CC:  follow up. Pt states [name] uses her cpap 7/7 night x 7 hrs a night. Pt states her legs have been bothering her at night. pt states it starts at 5:00 p.m. and goes on through out the night.  History of Present Illness: The pt comes in today for f/u of her osa and RLS.  [name] is wearing cpap more consistently, and feels [name] is developing tolerance.  [name] feels it does help her sleep and daytime alertness.  [name] is having no issues with her mask or pressure.  [name] does c/o increased leg discomfort starting in late afternoon and continuing into the night.  The discomfort [name] describes has features of RLS, but also has atypical features.  [name] has been staying on requip at night.  Current Medications (verified): 1)  Lamictal 200 Mg Tabs (Lamotrigine) .... One Tablet Two Times A Day 2)  Cymbalta 30 Mg Cpep (Duloxetine Hcl) .... Take 1 Tablet By Mouth Once A Day 3)  Synthroid 75 Mcg  Tabs (Levothyroxine Sodium) .... Take One Tab By Mouth Once Daily 4)  Requip 0.5 Mg Tabs (Ropinirole Hcl) .... Take 1-2 After Dinner Each Night 5)  Ativan 1 Mg Tabs (Lorazepam) .... Take 1 Tablet By Mouth Three Times A Day 6)  Diovan Hct 320-25 Mg Tabs (Valsartan-Hydrochlorothiazide) .... Take 1 Tablet By Mouth Once A Day  Allergies (verified): 1)  ! Compazine 2)  ! Sulfa 3)  ! Talwin 4)  ! Toradol 5)  ! * Geodon 6)  ! * Lithium  Review of Systems       The patient complains of shortness of breath with activity, acid heartburn, indigestion, nasal congestion/difficulty breathing through nose, and joint stiffness or pain.  The patient denies shortness of breath at rest, productive cough, non-productive cough, coughing up blood, chest pain, irregular heartbeats, loss of appetite, weight change, abdominal pain, difficulty swallowing, sore throat, tooth/dental problems, headaches, sneezing, itching, ear ache, anxiety,  depression, hand/feet swelling, rash, change in color of mucus, and fever.    Vital Signs:  Patient profile:   65 year old female Height:      65 inches Weight:      222.38 pounds BMI:     37.14 O2 Sat:      98 % on Room air Temp:     98.3 degrees F oral Pulse rate:   78 / minute BP sitting:   118 / 76  (left arm) Cuff size:   large  Vitals Entered By: Carver Fila (December 22, 2009 10:17 AM)  O2 Flow:  Room air CC: follow up. Pt states [name] uses her cpap 7/7 night x 7 hrs a night. Pt states her legs have been bothering her at night. pt states it starts at 5:00 p.m. and goes on through out the night Comments meds and allergies updated Phone number updated Carver Fila  December 22, 2009 10:17 AM    Physical Exam  General:  obese female in nad Nose:  no skin breakdown or pressure necrosis from cpap mask Extremities:  no edema or cyanosis  Neurologic:  alert and oriented,moves all 4. does not appear sleepy.   Impression & Recommendations:  Problem # 1:  OBSTRUCTIVE SLEEP APNEA (ICD-327.23) the pt is gradually improving her cpap tolerance thru desensitization.  [name] feels that [name] sleep well with the device most nights, and that it has helped her  daytime alertness.  I have asked her to continue working on weight loss and conditioning.    Problem # 2:  RESTLESS LEGS SYNDROME (ICD-333.94) the pt has noticed worsening leg symptoms in the afternoon into the evening despite being on requip.  It is unclear from her description if this is truly due to RLS, but it could be.  I have asked her to increase her requip to see if this will help.  If it does not, her discomfort is unlikely to be due to RLS.  Medications Added to Medication List This Visit: 1)  Lamictal 200 Mg Tabs (Lamotrigine) .... One tablet two times a day 2)  Requip 0.5 Mg Tabs (Ropinirole hcl) .... One in late afternoon, then 2 after dinner each night  Other Orders: Est. Patient Level III (28413) Misc. Referral (Misc.  Ref)  Patient Instructions: 1)  continue on cpap as tolerated.   2)  will change requip dose to 0.5mg  one around 5pm, and 2 tabs after dinner to see if it will help your leg discomfort 3)  will see in you qualify for the patient assistance program for the requip 4)  work on weight loss and conditioning. 5)  followup with me in one year.  Prescriptions: REQUIP 0.5 MG TABS (ROPINIROLE HCL) one in late afternoon, then 2 after dinner each night  #90 x 6   Entered and Authorized by:   Barbaraann Share MD   Signed by:   Barbaraann Share MD on 12/22/2009   Method used:   Print then Give to Patient   RxID:   [id number]    Immunization History:  Influenza Immunization History:    Influenza:  historical (12/21/2007)

## 2010-04-23 NOTE — Procedures (Signed)
Summary: EGD   EGD  Procedure date:  07/13/2000  Findings:      Location: [facility]                          [facility]  Patient:    [name], [name]                    MRN: [id number] Proc. Date: 07/13/00 Adm. Date:  [id number] Attending:  Louie Bun CC:         Ivin Booty, M.D.   Procedure Report  PROCEDURE:  Esophagogastroduodenoscopy with biopsy.  INDICATIONS FOR PROCEDURE:  History of hoarseness and sensation of throat tightening, as well as production of phlegm with suggestion of reflux as an etiology but with somewhat equivocal response to proton pump inhibitor. Procedure is to better assess the likelihood of gastroesophageal reflux contributing to her symptoms.  PROCEDURE:  The patient was placed in the left lateral decubitus position and placed on the pulse monitor with continuous low-flow oxygen delivered by nasal cannula.  She was sedated with 80 mg of IV Demerol and 8 mg of IV Versed.  The Olympus video colonoscope was advanced under direct vision into the oropharynx and the esophagus.  The esophagus was slightly tortuous but of normal caliber with the squamocolumnar line at 38 cm.  There was no visible hiatal hernia, ring, stricture, or other abnormality of the distal esophagus or GE junction. The stomach was entered, and a small amount of liquid secretions were suctioned from the fundus.  Retroflexed view of the cardia was unremarkable. The fundus appeared normal. Within the proximal body was seen a 1-1.2 cm round, elevated area, which was equivocal by appearance for a mucosal polyp versus a submucosal lesion such as a leiomyoma or lipoma. It was biopsied, and no fat was seen to extrude from the biopsied area to palpation with the biopsy forceps.  It was a bit firmer than what would be expected for a lipoma.  A single biopsy was taken from the lesion.  The remainder of the body and antrum appeared normal. The duodenum was  entered, both bulb and second portion, and were well-inspected and appeared to be within normal limits.  The scope was then withdrawn, and the patient returned to the recovery room in stable condition.  She tolerated the procedure well, and there were no immediate complications.  IMPRESSION: 1. Somewhat tortuous esophagus; otherwise, no abnormality of the esophagus or    gastroesophageal junction. 2. Gastric nodule, rule out polyp versus submucosal lesion.  PLAN: Await biopsy results now.  Will continue proton pump inhibitor.  If symptoms not adequately controlled, will consider a 24-hour pH study while on her proton pump inhibitor.  DD:  07/13/00 TD:  07/14/00 Job: [id number]

## 2010-04-23 NOTE — Progress Notes (Signed)
Summary: DME Company  Phone Note Call from Patient Call back at [facility] Phone [phone]   Caller: Patient Call For: Jaycey Gens Reason for Call: Talk to Nurse Summary of Call: [employer] is her DME company.  [place] based [phone] Initial call taken by: Eugene Gavia,  January 11, 2008 9:43 AM  Follow-up for Phone Call        Called pt and informed her that order was faxed to [facility] in [employer] and that [place] from [facility] will be contacting her to get this scheduled. Follow-up by: Alfonso Ramus,  January 11, 2008 9:55 AM

## 2010-04-23 NOTE — Assessment & Plan Note (Signed)
Summary: rov for cpap issues.   CC:  Pt is here for a f/u appt to discuss options regarding her cpap machine.  Pt states she takes her mask off multiple times during the night- some times unknowningly.  Pt states the cpap machine/mask "is irritating to wear."  .  History of Present Illness: the pt comes in today for f/u of her known osa.  Her recent 2 week auto download showed very poor compliance, and she is here for troubleshooting/discussion of other treatment options.  The pt thinks there are multiple issues, including anxiety and the inconvenience associated with cpap.  She is not sure cpap is a longterm option for her.  She is on a lot of meds for anxiety, but is not taking her trazodone on a regular basis.  Current Medications (verified): 1)  Lamictal 100 Mg  Tabs (Lamotrigine) .... Take 3 Tabs (300mg ) By Mouth Daily 2)  Cymbalta 30 Mg Cpep (Duloxetine Hcl) .... Take 1 Tablet By Mouth Once A Day 3)  Synthroid 75 Mcg  Tabs (Levothyroxine Sodium) .... Take One Tab By Mouth Once Daily 4)  Mirapex 1 Mg  Tabs (Pramipexole Dihydrochloride) .... Take One Tab By Mouth At Bedtime As Needed 5)  Seroquel 50 Mg Tabs (Quetiapine Fumarate) .... Take 1 Tablet By Mouth Once A Day 6)  Ativan 1 Mg Tabs (Lorazepam) .... Take 1 Tablet By Mouth Three Times A Day 7)  Diovan Hct 320-25 Mg Tabs (Valsartan-Hydrochlorothiazide) .... Take 1 Tablet By Mouth Once A Day  Allergies (verified): 1)  ! Compazine 2)  ! Sulfa 3)  ! Talwin 4)  ! Toradol  Review of Systems      See HPI  Vital Signs:  Patient profile:   65 year old female Height:      65 inches Weight:      223 pounds BMI:     37.24 O2 Sat:      95 % on Room air Temp:     98.1 degrees F oral Pulse rate:   91 / minute BP sitting:   118 / 66  (left arm) Cuff size:   large  Vitals Entered By: Arman Filter LPN (Jul 23, 452 2:10 PM)  O2 Flow:  Room air CC: Pt is here for a f/u appt to discuss options regarding her cpap machine.  Pt states she  takes her mask off multiple times during the night- some times unknowningly.  Pt states the cpap machine/mask "is irritating to wear."   Comments Medications reviewed with patient Arman Filter LPN  Jul 24, 979 2:11 PM    Physical Exam  General:  ow female in nad Nose:  no skin breakdown or pressure necrosis from cpap mask Neurologic:  alert, but a little sleepy, moves all 4.   Impression & Recommendations:  Problem # 1:  OBSTRUCTIVE SLEEP APNEA (ICD-327.23) the pt is having a lot of issues with maintaining on cpap.  Part of the problem is anxiety and sleep disruption, and part due to it "being a bother" to her.  I have discussed with her continuing to work on cpap vs looking at dental appliance or just weight loss alone.  She does not have any way to pay for dental appliance.  She is also already on a lot of medications for her mental health, although is not taking trazodone on a regular basis.  I have asked her to start back on her trazodone at 50-100mg  at [facility] while trying to work on  cpap.  We will do the autotitration one more time for 2 weeks to again try and optimize her pressure.  The pt states that she will do her best to be compliant.  Medications Added to Medication List This Visit: 1)  Mirapex 1 Mg Tabs (Pramipexole dihydrochloride) .... Take one tab by mouth at bedtime as needed 2)  Seroquel 50 Mg Tabs (Quetiapine fumarate) .... Take 1 tablet by mouth once a day 3)  Ativan 1 Mg Tabs (Lorazepam) .... Take 1 tablet by mouth three times a day 4)  Diovan Hct 320-25 Mg Tabs (Valsartan-hydrochlorothiazide) .... Take 1 tablet by mouth once a day  Other Orders: Est. Patient Level III (56213) DME Referral (DME)  Patient Instructions: 1)  will try automachine for next 2 weeks one more time to optimize your pressure.  I will call you with results 2)  do not take your mask off if you awaken during the night. 3)  work on weight loss. 4)  will arrange f/u once your data  returns.   Immunization History:  Influenza Immunization History:    Influenza:  historical (03/22/2009)  Pneumovax Immunization History:    Pneumovax:  n/a (07/23/2009)

## 2010-04-23 NOTE — Progress Notes (Signed)
Summary: CPAP PRESSURE  Phone Note Call from Patient Call back at Home Phone [phone]   Caller: Patient Call For: [facility] Summary of Call: PT STATES SHE IS STILL WAITING TO HAVE THE PRESSURE CHANGED ON CPAP. SAYS [facility] HAS TOLD HER THEY ARE WAITING TO HEAR BACK FROM [place].  Initial call taken by: Tivis Ringer, CNA,  July 17, 2009 1:05 PM  Follow-up for Phone Call        Spoke with [employer] and they faxed a compliance report on the pt on 05/13/09. Pt is asking if this has been reviewed yet and does she need to change pressure on CPAP. Please advise. Carron Curie CMA  July 17, 2009 1:47 PM   Additional Follow-up for Phone Call Additional follow up Details #1::        let pt know that we reviewed auto report in Jan that was inadequate because she didn't wear cpap enough.  WE ordered a repeat and have never received!!  let her know this, then see if they will send [place] the download for review. Additional Follow-up by: Barbaraann Share MD,  July 17, 2009 4:52 PM    Additional Follow-up for Phone Call Additional follow up Details #2::    Aundra Millet, can you help track down results, thanks Vernie Murders  July 17, 2009 5:07 PM  results in [facility] very important look at folder. Aundra Millet Reynolds LPN  July 17, 2009 5:16 PM    Additional Follow-up for Phone Call Additional follow up Details #3:: Details for Additional Follow-up Action Taken: let her know that she only used 11/21 days, and only 1/3 of the time used for 4 hours or more.  The study also showed significant mask leaks.  We really could not get adequate data from this.  She needs ov with me to discuss the viability of this therapy for her, and whether we need to consider other options.  Spoke with pt and advised of the above.  Appt was sched for 07/21/09 at 12 noon. Vernie Murders  July 18, 2009 8:48 AM  Additional Follow-up by: Barbaraann Share MD,  July 17, 2009 7:29 PM

## 2010-04-23 NOTE — Assessment & Plan Note (Signed)
History of Present Illness Visit Type: Initial Visit Primary GI MD: Rob Bunting MD Primary Provider: Duane Lope, MD Chief Complaint: RUQ pain & nausea History of Present Illness:     65 year old woman who has been "off balance for a couple weeks" but became very dizzy in our office waiting room.  The room is not spinning, but the dizziness is "inside my head."   "my pulses too slow I feel like I'm going to pass out,"  she was very tearful.   she actually set this appointment up several weeks ago to discuss intermittent right upper quadrant pain, nausea, vomiting.  she has been RUQ, nausea, vomiting attacks. These occur about once a month.  She gets very bloated, nauseated and also has ruq discomfort. This can last for 2 hours.  No fevers or chills.  She has not had an ultrasound.  SHe gets mild GERD (pyrosis ).  the symptoms have been going for about a year.  She used to see Dr. Madilyn Fireman in distant past, seems to think her symptoms were similar to this, not sure what it was or what was decided.  does not take NSAIDs very often (ibuprofen every few days or so for back pain).   Overall her weight is up in past several years (30 pounds).  She is most bothered by acute light headedness, dizzyness.  This new for her.  THis started in our waiting room this morning.   No new meds.             Current Medications (verified): 1)  Lamictal 200 Mg Tabs (Lamotrigine) .... One Tablet Two Times A Day 2)  Cymbalta 30 Mg Cpep (Duloxetine Hcl) .... Take 1 Tablet By Mouth Once A Day 3)  Synthroid 75 Mcg  Tabs (Levothyroxine Sodium) .... Take One Tab By Mouth Once Daily 4)  Requip 0.5 Mg Tabs (Ropinirole Hcl) .... One in Late Afternoon, Then 2 After Dinner Each Night 5)  Ativan 1 Mg Tabs (Lorazepam) .... Take 1 Tablet By Mouth Three Times A Day 6)  Diovan Hct 320-25 Mg Tabs (Valsartan-Hydrochlorothiazide) .... Take 1 Tablet By Mouth Once A Day  Allergies (verified): 1)  ! Compazine 2)  ! Sulfa 3)   ! Talwin 4)  ! Toradol 5)  ! * Geodon 6)  ! 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) Hx of EFFUSION, PLEURAL (ICD-511.9) Hx of ATTENTION DEFICIT DISORDER (ICD-314.00) DEPRESSION (ICD-311) Hx of ENDOMETRIOSIS (ICD-617.9) RESTLESS LEGS SYNDROME (ICD-333.94) OBSTRUCTIVE SLEEP APNEA (ICD-327.23)  Anxiety Obesity GERD    Past Surgical History: thyroidectomy 1990 tubal ligation approx 1970s nasoseptal reconstruction 1990s cardiac ablation 2000s tonsillectomy as a child    Family History: allergies: father, sisters, brothers, daughter heart disase: mother, maternal grandfather cancer: paternal grandmother (colon), paternal grandfather (unsure what kind), father (skin)     Social History: Patient never smoked.  pt is divorced and lives alone. pt has children. pt is currently unemployed.  has medical office background.    Review of Systems       Pertinent positive and negative review of systems were noted in the above HPI and GI specific review of systems.  All other review of systems was otherwise negative.   Vital Signs:  Patient profile:   65 year old female Height:      65 inches Weight:      220 pounds BMI:     36.74 Pulse rate:   64 / minute Pulse rhythm:  regular BP sitting:   120 / 80  (left arm) Cuff size:   regular  Vitals Entered By: June McMurray CMA Duncan Dull) (December 23, 2009 3:06 PM)  Physical Exam  Additional Exam:  Constitutional: generally well appearing Psychiatric: Tearful, laying back on the exam table Eyes: extraocular movements intact Mouth: oropharynx moist, no lesions Neck: supple, no lymphadenopathy Cardiovascular: heart regular rate and rythm Lungs: CTA bilaterally Abdomen: soft, non-tender, non-distended, no obvious ascites, no peritoneal signs, normal bowel sounds Extremities: no lower extremity edema bilaterally Skin: no lesions on visible extremities      Impression &  Recommendations:  Problem # 1:  intermittent nausea, vomiting, abdominal pain disease may be biliary symptoms. I would like to set her up with abdominal ultrasound as well as CBC, complete metabolic profile.  Her acute, possible neurologic symptoms are much more concerning and I'm going to send her to the emergency room for evaluation.  My office will contact her in 3-4 days to see how she is doing and if appropriate we will proceed with the abdominal ultrasound as well as lab tests.  Problem # 2:  acute dizziness, lightheadedness she has been feeling terrible since walking into our office about a half an hour ago. She feels like she is going to pass out however her vital signs looked stable.  she is very tearful and I wonder some of this is a psychiatric issue. Either way I'm not comfortable letting her go home after this especially with her complaints of feeling like she is going to pass out. We're going to arrange transport to [facility] long emergency room for evaluation.  Patient Instructions: 1)  you'll be sent to [facility] long emergency room for evaluation of acute lightheadedness, dizziness, feeling like you're going to pass out. 2)  Dr. Christella Hartigan office will contact you in 3-4 days and if appropriate we will arrange ultrasound as well as lab tests including a CBC, complete metabolic profile. 3)  The medication list was reviewed and reconciled.  All changed / newly prescribed medications were explained.  A complete medication list was provided to the patient / caregiver.  Appended Document: Orders Update/us PT AWARE AND WILL HAVE LABS DONE    Clinical Lists Changes  Problems: Added new problem of ABDOMINAL PAIN OTHER SPECIFIED SITE (ICD-789.09) Added new problem of NAUSEA AND VOMITING (ICD-787.01) Orders: Added new Test order of Ultrasound Abdomen (UAS) - Signed

## 2010-04-23 NOTE — Assessment & Plan Note (Signed)
Summary: rov for abnormal cxr   Primary Provider/Referring Provider:  Duane Lope  CC:  Pulmonary Consult.  History of Present Illness: The pt comes in today for f/u of a ?pleural effusion?  She was recently in the hospital for multiple complaints/issues, and had a cxr which showed prominent bronchovascular markings, but also had small lung volumes with crowding of vasculature.  No obvious pleural effusion seen.  The pt has chronic doe related to her deconditioning and obesity, but is having no cough or congestion.  She has no known h/o LV dysfunction, but has had an ablation of some type in the past.  Will need to pull her old chart for review.  Preventive Screening-Counseling & Management  Alcohol-Tobacco     Smoking Status: never  Current Medications (verified): 1)  Lamictal 100 Mg  Tabs (Lamotrigine) .... Take 4 Tabs (400mg ) By Mouth Daily 2)  Cymbalta 30 Mg Cpep (Duloxetine Hcl) .... Take 1 Tablet By Mouth Once A Day 3)  Synthroid 75 Mcg  Tabs (Levothyroxine Sodium) .... Take One Tab By Mouth Once Daily 4)  Requip 0.5 Mg Tabs (Ropinirole Hcl) .... Take 1-2 After Dinner Each Night 5)  Seroquel 50 Mg Tabs (Quetiapine Fumarate) .... Take 1 Tablet By Mouth Once A Day 6)  Ativan 1 Mg Tabs (Lorazepam) .... Take 1 Tablet By Mouth Three Times A Day 7)  Diovan Hct 320-25 Mg Tabs (Valsartan-Hydrochlorothiazide) .... Take 1 Tablet By Mouth Once A Day  Allergies: 1)  ! Compazine 2)  ! Sulfa 3)  ! Talwin 4)  ! Toradol 5)  ! * Geodon 6)  ! * Lithium  Past History:  Past medical, surgical, family and social histories (including risk factors) reviewed, and no changes noted (except as noted below).  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 allergies: father, sisters, brothers, daughter heart disase: mother, maternal grandfather cancer: paternal grandmother (colon), paternal grandfather (unsure what kind), father (skin)   Social History: Reviewed history and no changes required. Patient never smoked.  pt is divorced and lives alone. pt has children. pt is currently unemployed.  has medical office background.  Review of Systems       The patient complains of shortness of breath with activity, irregular heartbeats, abdominal pain, anxiety, and depression.  The patient denies shortness of breath at rest, productive cough, non-productive cough, coughing up blood, chest pain, acid heartburn, indigestion, loss of appetite, weight change, difficulty swallowing, sore throat, tooth/dental problems, headaches, nasal congestion/difficulty breathing through nose, sneezing, itching, ear ache, hand/feet swelling, joint stiffness or pain, rash, change in color of mucus, and fever.    Vital Signs:  Patient profile:   65 year old female Height:      65 inches Weight:      224 pounds BMI:     37.41 O2 Sat:      96 % on Room air Temp:     98.5 degrees F oral Pulse rate:   91 / minute BP sitting:   110 / 60  (left arm) Cuff size:   large  Vitals Entered By: Arman Filter LPN (November 12, 2009 10:49 AM)  O2 Flow:  Room air  CC: Pulmonary Consult Comments Medications reviewed with patient Arman Filter LPN  November 12, 2009 10:49 AM    Physical Exam  General:  obese female in nad Nose:  no skin breakdown or pressure necrosis from cpap mask Mouth:  clear Lungs:  minimal basilar crackles, but poor depth of inspiration. Heart:  rrr, no mrg Extremities:  no significant edema noted, no cyanosis Neurologic:  alert and oriented, moves all  4.   Impression & Recommendations:  Problem # 1:  ABNORMAL CHEST XRAY (ICD-793.1) the pt has had a recent cxr which shows prominent bronchovascular markings, but in the face of low lung volumes.  I suspect it is due to her poor depth of inspiration, but cannot r/o vascular congestion with pulmonary venous htn.  Her ekg does suggest LAE, and she tells me she has a h/o some type of "ablation".  I do not think cxr represents ISLD.  Of note, her recent BNP was also normal.  Will check echo first, and if unremarkable, will consider HRCT.  Problem # 2:  OBSTRUCTIVE SLEEP APNEA (ICD-327.23) the pt has been able to tolerate cpap a little more, and feels that she is sleeping better.  Medications Added to Medication List This Visit: 1)  Lamictal 100 Mg Tabs (Lamotrigine) .... Take 4 tabs (400mg ) by mouth daily  Other Orders: Est. Patient Level IV (09811) Echo Referral (Echo)  Patient Instructions: 1)  will check echo to look at heart function and atrial size. 2)  work on weight loss and conditioning 3)  continue with cpap 4)  I will call you once echo results available.

## 2010-04-23 NOTE — Progress Notes (Signed)
Summary: cpap  Phone Note Call from Patient Call back at Home Phone [phone]   Caller: Patient Call For: Nyree Applegate Reason for Call: Talk to Nurse Summary of Call: don't feel any better using her cpap, should pressure be higher? Initial call taken by: Eugene Gavia,  December 21, 2007 1:22 PM  Follow-up for Phone Call        pt not seen since 12-08, was supposed to follow up in 2 months.  pt needs OV with KC to discuss her cpap issues. LMOMTCB Boone Master CNA  December 21, 2007 3:24 PM   Additional Follow-up for Phone Call Additional follow up Details #1::        Spoke with pt; appt scheduled with [facility] on 01-01-08 at 3:30pm.  Additional Follow-up by: Reynaldo Minium CMA,  December 22, 2007 10:28 AM

## 2010-06-01 LAB — BASIC METABOLIC PANEL
BUN: 16 mg/dL (ref 6–23)
CO2: 25 mEq/L (ref 19–32)
Calcium: 9.5 mg/dL (ref 8.4–10.5)
Chloride: 103 mEq/L (ref 96–112)
Creatinine, Ser: 0.86 mg/dL (ref 0.4–1.2)
GFR calc Af Amer: >60 mL/min (ref >60)
GFR calc non Af Amer: >60 mL/min (ref >60)
Glucose, Bld: 107 mg/dL — ABNORMAL HIGH (ref 70–99)
Potassium: 4.3 mEq/L (ref 3.5–5.1)
Sodium: 142 mEq/L (ref 135–145)

## 2010-06-01 LAB — HEPATIC FUNCTION PANEL
ALT: 33 U/L (ref 0–35)
AST: 37 U/L (ref 0–37)
Albumin: 4 g/dL (ref 3.5–5.2)
Alkaline Phosphatase: 87 U/L (ref 39–117)
Bilirubin, Direct: 0.1 mg/dL (ref 0.0–0.3)
Indirect Bilirubin: 0.1 mg/dL — ABNORMAL LOW (ref 0.3–0.9)
Total Bilirubin: 0.2 mg/dL — ABNORMAL LOW (ref 0.3–1.2)
Total Protein: 7 g/dL (ref 6.0–8.3)

## 2010-06-02 LAB — CBC
HCT: 40.8 % (ref 36.0–46.0)
Hemoglobin: 14 g/dL (ref 12.0–15.0)
MCH: 29.1 pg (ref 26.0–34.0)
MCHC: 34.3 g/dL (ref 30.0–36.0)
MCV: 84.7 fL (ref 78.0–100.0)
Platelets: 251 K/uL (ref 150–400)
RBC: 4.82 MIL/uL (ref 3.87–5.11)
RDW: 14 % (ref 11.5–15.5)
WBC: 5.6 K/uL (ref 4.0–10.5)

## 2010-06-02 LAB — WET PREP, GENITAL
Clue Cells Wet Prep HPF POC: NONE SEEN
Trich, Wet Prep: NONE SEEN
Yeast Wet Prep HPF POC: NONE SEEN

## 2010-06-02 LAB — RAPID URINE DRUG SCREEN, HOSP PERFORMED
Amphetamines: NOT DETECTED
Barbiturates: NOT DETECTED
Benzodiazepines: POSITIVE — AB
Cocaine: NOT DETECTED
Opiates: NOT DETECTED
Tetrahydrocannabinol: NOT DETECTED

## 2010-06-02 LAB — GC/CHLAMYDIA PROBE AMP, GENITAL
Chlamydia, DNA Probe: NEGATIVE
GC Probe Amp, Genital: NEGATIVE

## 2010-06-02 LAB — ETHANOL: Alcohol, Ethyl (B): 6 mg/dL (ref 0–10)

## 2010-06-02 LAB — URINALYSIS, ROUTINE W REFLEX MICROSCOPIC
Bilirubin Urine: NEGATIVE
Glucose, UA: NEGATIVE mg/dL
Hgb urine dipstick: NEGATIVE
Ketones, ur: NEGATIVE mg/dL
Nitrite: NEGATIVE
Protein, ur: NEGATIVE mg/dL
Specific Gravity, Urine: 1.014 (ref 1.005–1.030)
Urobilinogen, UA: 0.2 mg/dL (ref 0.0–1.0)
pH: 7 (ref 5.0–8.0)

## 2010-06-02 LAB — DIFFERENTIAL
Basophils Absolute: 0 K/uL (ref 0.0–0.1)
Basophils Relative: 1 % (ref 0–1)
Eosinophils Absolute: 0.2 K/uL (ref 0.0–0.7)
Eosinophils Relative: 3 % (ref 0–5)
Lymphocytes Relative: 30 % (ref 12–46)
Lymphs Abs: 1.7 K/uL (ref 0.7–4.0)
Monocytes Absolute: 0.3 K/uL (ref 0.1–1.0)
Monocytes Relative: 6 % (ref 3–12)
Neutro Abs: 3.3 K/uL (ref 1.7–7.7)
Neutrophils Relative %: 60 % (ref 43–77)

## 2010-06-02 LAB — BASIC METABOLIC PANEL WITH GFR
BUN: 15 mg/dL (ref 6–23)
CO2: 30 meq/L (ref 19–32)
Calcium: 9.8 mg/dL (ref 8.4–10.5)
Chloride: 103 meq/L (ref 96–112)
Creatinine, Ser: 0.84 mg/dL (ref 0.4–1.2)
GFR calc Af Amer: >60 mL/min (ref >60)
GFR calc non Af Amer: >60 mL/min (ref >60)
Glucose, Bld: 106 mg/dL — ABNORMAL HIGH (ref 70–99)
Potassium: 3.9 meq/L (ref 3.5–5.1)
Sodium: 141 meq/L (ref 135–145)

## 2010-06-02 LAB — HERPES SIMPLEX VIRUS CULTURE: Culture: NOT DETECTED

## 2010-06-02 LAB — TRICYCLICS SCREEN, URINE: TCA Scrn: NOT DETECTED

## 2010-06-02 LAB — T4, FREE: Free T4: 0.94 ng/dL (ref 0.80–1.80)

## 2010-06-02 LAB — URINE MICROSCOPIC-ADD ON

## 2010-06-02 LAB — VITAMIN B12: Vitamin B-12: 944 pg/mL — ABNORMAL HIGH (ref 211–911)

## 2010-06-02 LAB — MAGNESIUM: Magnesium: 2.4 mg/dL (ref 1.5–2.5)

## 2010-06-02 LAB — RPR: RPR Ser Ql: NONREACTIVE

## 2010-06-02 LAB — TSH: TSH: 1.761 u[IU]/mL (ref 0.350–4.500)

## 2010-06-02 LAB — T3, FREE: T3, Free: 2.6 pg/mL (ref 2.3–4.2)

## 2010-06-07 LAB — URINALYSIS, ROUTINE W REFLEX MICROSCOPIC
Bilirubin Urine: NEGATIVE
Glucose, UA: NEGATIVE mg/dL
Hgb urine dipstick: NEGATIVE
Ketones, ur: NEGATIVE mg/dL
Nitrite: NEGATIVE
Protein, ur: NEGATIVE mg/dL
Specific Gravity, Urine: 1.022 (ref 1.005–1.030)
Urobilinogen, UA: 0.2 mg/dL (ref 0.0–1.0)
pH: 5.5 (ref 5.0–8.0)

## 2010-06-07 LAB — COMPREHENSIVE METABOLIC PANEL
ALT: 33 U/L (ref 0–35)
AST: 33 U/L (ref 0–37)
Albumin: 3.7 g/dL (ref 3.5–5.2)
Alkaline Phosphatase: 72 U/L (ref 39–117)
BUN: 18 mg/dL (ref 6–23)
CO2: 29 mEq/L (ref 19–32)
Calcium: 9.4 mg/dL (ref 8.4–10.5)
Chloride: 106 mEq/L (ref 96–112)
Creatinine, Ser: 0.96 mg/dL (ref 0.4–1.2)
GFR calc Af Amer: >60 mL/min (ref >60)
GFR calc non Af Amer: 59 mL/min — ABNORMAL LOW (ref >60)
Glucose, Bld: 134 mg/dL — ABNORMAL HIGH (ref 70–99)
Potassium: 4 mEq/L (ref 3.5–5.1)
Sodium: 141 mEq/L (ref 135–145)
Total Bilirubin: 0.6 mg/dL (ref 0.3–1.2)
Total Protein: 6.6 g/dL (ref 6.0–8.3)

## 2010-06-07 LAB — URINE MICROSCOPIC-ADD ON

## 2010-06-07 LAB — CK TOTAL AND CKMB (NOT AT ARMC)
CK, MB: 2 ng/mL (ref 0.3–4.0)
Relative Index: INVALID (ref 0.0–2.5)
Total CK: 70 U/L (ref 7–177)

## 2010-06-07 LAB — URINE CULTURE: Colony Count: 45000

## 2010-06-07 LAB — DIFFERENTIAL
Basophils Absolute: 0 10*3/uL (ref 0.0–0.1)
Basophils Relative: 1 % (ref 0–1)
Eosinophils Absolute: 0.1 10*3/uL (ref 0.0–0.7)
Eosinophils Relative: 2 % (ref 0–5)
Lymphocytes Relative: 28 % (ref 12–46)
Lymphs Abs: 1.4 10*3/uL (ref 0.7–4.0)
Monocytes Absolute: 0.4 10*3/uL (ref 0.1–1.0)
Monocytes Relative: 7 % (ref 3–12)
Neutro Abs: 3.1 10*3/uL (ref 1.7–7.7)
Neutrophils Relative %: 61 % (ref 43–77)

## 2010-06-07 LAB — CBC
HCT: 36.5 % (ref 36.0–46.0)
Hemoglobin: 12.5 g/dL (ref 12.0–15.0)
MCH: 29.1 pg (ref 26.0–34.0)
MCHC: 34.2 g/dL (ref 30.0–36.0)
MCV: 84.9 fL (ref 78.0–100.0)
Platelets: 225 10*3/uL (ref 150–400)
RBC: 4.3 MIL/uL (ref 3.87–5.11)
RDW: 13.9 % (ref 11.5–15.5)
WBC: 5.1 10*3/uL (ref 4.0–10.5)

## 2010-06-07 LAB — BRAIN NATRIURETIC PEPTIDE: Pro B Natriuretic peptide (BNP): <30 pg/mL (ref 0.0–100.0)

## 2010-06-07 LAB — TROPONIN I: Troponin I: 0.01 ng/mL (ref 0.00–0.06)

## 2010-06-27 LAB — BASIC METABOLIC PANEL
BUN: 13 mg/dL (ref 6–23)
CO2: 28 mEq/L (ref 19–32)
Calcium: 9.8 mg/dL (ref 8.4–10.5)
Chloride: 109 mEq/L (ref 96–112)
Creatinine, Ser: 0.74 mg/dL (ref 0.4–1.2)
GFR calc Af Amer: >60 mL/min (ref >60)
GFR calc non Af Amer: >60 mL/min (ref >60)
Glucose, Bld: 100 mg/dL — ABNORMAL HIGH (ref 70–99)
Potassium: 3.9 mEq/L (ref 3.5–5.1)
Sodium: 142 mEq/L (ref 135–145)

## 2010-06-27 LAB — DIFFERENTIAL
Basophils Absolute: 0 10*3/uL (ref 0.0–0.1)
Basophils Relative: 0 % (ref 0–1)
Eosinophils Absolute: 0.2 10*3/uL (ref 0.0–0.7)
Eosinophils Relative: 3 % (ref 0–5)
Lymphocytes Relative: 32 % (ref 12–46)
Lymphs Abs: 1.5 10*3/uL (ref 0.7–4.0)
Monocytes Absolute: 0.3 10*3/uL (ref 0.1–1.0)
Monocytes Relative: 7 % (ref 3–12)
Neutro Abs: 2.7 10*3/uL (ref 1.7–7.7)
Neutrophils Relative %: 58 % (ref 43–77)

## 2010-06-27 LAB — POCT CARDIAC MARKERS
CKMB, poc: 1.1 ng/mL (ref 1.0–8.0)
CKMB, poc: 1.4 ng/mL (ref 1.0–8.0)
Myoglobin, poc: 42.9 ng/mL (ref 12–200)
Myoglobin, poc: 45.5 ng/mL (ref 12–200)
Troponin i, poc: <0.05 ng/mL (ref 0.00–0.09)
Troponin i, poc: <0.05 ng/mL (ref 0.00–0.09)

## 2010-06-27 LAB — CBC
HCT: 39.7 % (ref 36.0–46.0)
Hemoglobin: 13.5 g/dL (ref 12.0–15.0)
MCHC: 34.1 g/dL (ref 30.0–36.0)
MCV: 84.9 fL (ref 78.0–100.0)
Platelets: 230 10*3/uL (ref 150–400)
RBC: 4.68 MIL/uL (ref 3.87–5.11)
RDW: 13.8 % (ref 11.5–15.5)
WBC: 4.6 10*3/uL (ref 4.0–10.5)

## 2010-07-01 ENCOUNTER — Telehealth: Payer: Self-pay | Admitting: Pulmonary Disease

## 2010-07-01 MED ORDER — ROPINIROLE HCL 0.5 MG PO TABS: ORAL_TABLET | ORAL | Status: DC

## 2010-07-01 NOTE — Telephone Encounter (Signed)
RX sent to her pharmacy and pt is aware.

## 2010-07-01 NOTE — Telephone Encounter (Signed)
Pt is requesting a refill on requip 0.5 mg, take 1 late in the afternoon and 2 after dinner each night. Pls advise.

## 2010-07-01 NOTE — Telephone Encounter (Signed)
She was seen in Feb, and her dose was changed to this with f/u in one year.  So it is ok to send.

## 2010-07-31 ENCOUNTER — Encounter: Payer: Self-pay | Admitting: Pulmonary Disease

## 2010-08-04 ENCOUNTER — Ambulatory Visit: Payer: Self-pay | Admitting: Pulmonary Disease

## 2010-08-04 ENCOUNTER — Encounter (HOSPITAL_BASED_OUTPATIENT_CLINIC_OR_DEPARTMENT_OTHER): Payer: Medicare Other | Admitting: Oncology

## 2010-08-04 DIAGNOSIS — C679 Malignant neoplasm of bladder, unspecified: Secondary | ICD-10-CM

## 2010-08-04 NOTE — Consult Note (Signed)
NAME:  LENOLA, LOCKNER NO.:  [id number]   MEDICAL RECORD NO.:  [id number]          PATIENT TYPE:  EMS   LOCATION:  ED                           FACILITY:  [facility]   PHYSICIAN:  Nicki Guadalajara, M.D.     DATE OF BIRTH:  07-May-1945   DATE OF CONSULTATION:  DATE OF DISCHARGE:                                 CONSULTATION   PATIENT PROFILE:  Ms. Aarti Mankowski is a 66 year old female who  presented to the [facility] Emergency Room today with intermittent  episodes of chest discomfort.  She was seen by Dr. Leatrice Jewels who felt the  patient most likely can go home.  A cardiology consultation was  requested with plans for ultimate scheduling the patient for stress  testing.   HISTORY OF PRESENT ILLNESS:  Ms. Hidrogo is a 65 year old female.  She  denies any known history of coronary artery disease.  She states she did  have a longstanding history of tachycardia during childhood.  She is  status post ablation several years ago and denies any significant  recurrent tachypalpitations since that time.  Over the last several  days, she has noticed some episodes of some chest pressure.  She  experienced an episode last evening, which seemed to resolve when she  went to bed.  She again had another episode this morning.  She is  currently pain free.  Due to this episode, she did present to the  emergency room where she was seen by Dr. Leatrice Jewels.  Cardiac markers were  obtained x2 and were negative.   PAST MEDICAL HISTORY:  History of tachycardia, status post ablation.  She does admit to a history of hypertension.   PAST SURGICAL HISTORY:  Thyroidectomy.  She also underwent several  laparotomies for endometriosis and also has history of status post tubal  ligation.   CURRENT MEDICATIONS:  1. Trazodone 150 mg at bedtime.  2. Diovan 320 mg.  3. Levothyroxine 75 mcg.  4. Cymbalta 30 mg  5. She also takes lorazepam.   SOCIAL HISTORY:  She is legally separated.  She has 1 adult  daughter.  There is no tobacco use or alcohol use.  Currently, she unemployed,  looking for a job.   FAMILY HISTORY:  The mother died at age 82 and had heart problems.  Father died at age 53 and had Creutzfeldt-Jakob disease.  She has 3  sisters and 1 brother with no known cardiac problems.   REVIEW OF SYSTEMS:  Negative for fever, chills, night sweats.  She  denies presyncope or syncope.  She denies wheezing.  She denies  bleeding.  She denies abdominal pain.  She denies significant edema.  She denies tremors.  She is unaware of her cholesterol status.  She  denies any diabetes.   PHYSICAL EXAMINATION:  GENERAL:  On examination, she is a 65 year old,  moderately overweight female, in no acute distress.  VITAL SIGNS:  Blood pressure was 140/84, pulse was 82.  EYES:  Sclerae was anicteric.  There was no lid lag.  NECK:  She did not have JVD.  She did not have chronic bruits.  LUNGS:  Clear.  There was no chest wall tenderness.  HEART:  Rhythm was regular.  There was a faint 1/6 systolic murmur.  ABDOMEN:  She did have moderate central adiposity without  hepatosplenomegaly.  Abdominal aorta was not increased by palpation.  MUSCULOSKELETAL:  Distal pulse of 2+.  There was no clubbing, cyanosis,  or edema.   EKG shows normal sinus rhythm at 75 beats per minute.  There is a small  nondiagnostic Q-wave in lead III.   LABORATORY:  Hemoglobin 13.5, hematocrit 39.7.  Sodium 142, potassium  3.9, chloride 109, carbon dioxide 28, glucose 100, BUN 13, creatinine  0.74.  Myoglobin 42.9.  CK-MB 1.4, troponin negative x2.   IMPRESSION:  Ms. Treadway is a 65 year old female who has history of  tachycardia in the past for which she has undergone prior ablation  therapy.  I do not know the exact etiology of her tachycardia and  records will need to be obtained.  At present, her chest pain does have  some atypical features.  She denies any exertional component.  She has  experienced some chest  tightness.  I do feel noninvasive evaluation is  warranted.  I feel that she can be discharged home and should be given a  prescription for sublingual nitroglycerin.  She will be scheduled for a  stress Myoview study at our office and I will see her back in the office  for followup evaluation.  Our office will call her next week to arrange  this.  Since this is Friday evening, our office will call her on Monday  to arrange for this study.           ______________________________  Nicki Guadalajara, M.D.     TK/MEDQ  D:  11/08/2008  T:  11/09/2008  Job:  [id number]

## 2010-08-07 NOTE — Discharge Summary (Signed)
[name], [name] NO.:  [id number]   MEDICAL RECORD NO.:  [id number]          PATIENT TYPE:  IPS   LOCATION:  0502                          FACILITY:  [facility]   PHYSICIAN:  Geoffery Lyons, M.D.      DATE OF BIRTH:  March 29, 1945   DATE OF ADMISSION:  09/14/2004  DATE OF DISCHARGE:  09/22/2004                                 DISCHARGE SUMMARY   CHIEF COMPLAINT AND PRESENT ILLNESS:  This was the third admission to [facility] for this 65 year old separated female voluntarily  admitted.  Referred by Dr. Jennelle Human after an episode of confusion after  confusion for the last two weeks while attempting to stabilize her mood on  Depakote.  Reports two months of unstable mood with decreased sleeping,  increased tearfulness, anhedonia, decreased concentration, problems since  separation from her husband a year ago.  Praying to God to take her to  heaven.  Driving on the wrong side of the road.   PAST PSYCHIATRIC HISTORY:  [facility] for outpatient therapy, [facility] for medication management.   ALCOHOL/DRUG HISTORY:  Denies the active use of any substances.   MEDICAL HISTORY:  Arterial hypertension, irritable bowel syndrome,  hypothyroidism, migraine headaches.   MEDICATIONS:  Synthroid 70 mcg daily, Diovan 40 mg per day, Lamictal 150 mg  per day, Adderall XR 20 mg daily, Depakote ER 500 mg, increased to 1000 mg,  Ativan 0.5 mg twice a day and 1 at night.   PHYSICAL EXAMINATION:  Performed and failed to show any acute findings.   LABORATORY DATA:  CBC within normal limits.  Blood chemistry within normal  limits.  Glucose 100.  Liver enzymes with SGOT 24, SGPT 30.  TSH 5.493.   MENTAL STATUS EXAM:  Fully alert female.  Mild psychomotor retardation.  Tearful, cooperative, pleasant.  Speech normal rate, tempo and production.  Mood depressed.  Thought processes passive suicidal thoughts, no delusions,  no suicidal or homicidal ideation but wanting to  die.  Endorsed being  perplexed, confused.  Cognition was well-preserved.   ADMISSION DIAGNOSES:  AXIS I:  Bipolar disorder.  Attention-deficit  hyperactivity disorder.  Post-traumatic stress disorder by history.  AXIS II:  No diagnosis.  AXIS III:  Irritable bowel syndrome, arterial hypertension, status post  supraventricular tachycardia.  AXIS IV:  Moderate.  AXIS V:  GAF upon admission 38; highest GAF in the last year 68.   HOSPITAL COURSE:  She was admitted.  She was started in individual and group  psychotherapy.  She was maintained on her medications and she was given  Risperdal M-Tab 0.5 mg at night.  Synthroid was placed at 75 mcg daily,  trazodone 100 mg at bedtime, Ativan 0.5 mg every four hours as needed,  Lamictal 150 mg in the morning, Mirapex 0.5 mg every 6 p.m.  We continued to  work with the Lamictal and the Risperdal, increasing Lamictal to 200 mg and  the Risperdal to M-Tab 1 mg at night.  She endorsed having seen increased  mood fluctuation.  Told she was having mixed episodes, rapid cycling, given  Depakote.  Having a hard time with the Depakote, difficulty with  coordination, difficult time with mood being tearful, lack of coordination,  driving off the road, praying to God to take her life.  We went ahead and  discontinued the Depakote and started working with the Risperdal.  She  continued to be very labile.  Endorsed she was having a lot of mood swings,  feeling down, depressed and then anxious.  On the morning of this  evaluation, she felt numb.  Endorsed she was having a difficult time feeling  this way.  We started with the Risperdal.  She was feeling to pursue this  medication.  Not sure it was the Depakote that caused the symptoms as her  Lamictal was cut from 300 mg to 150 mg.  She was started on Depakote due to  rapid-cycling.  She was also on Adderall and this was discontinued due to  the possibility of Adderall inducing the mood swings.  We continued to  work  with the Lamictal and the Risperdal.  There was some anxiety, depression,  was able to open up and talk about her feelings, breaking up, the  relationship with her husband.  Endorsed that she understood that it was the  right thing to do but still had a lot of feelings, very tearful.  Grieving  the loss of the relationship.  Continued to be very labile.  Started crying  when talking about the situation.  Working on the divorce.  Was able to talk  about the loss of significant people in her life, loss of health.  There was  a family session with the husband.  He was wanting to be supportive of her.  She was less labile.  Sleep was an issue.  There was some increase in blood  pressure.  By July 4th, she was in full contact with reality.  Endorsed no  suicidal ideation, no homicidal ideation, no hallucinations, no delusions.  Overall better.  Tolerating the medication better.  There was some  headaches.  She was willing to come to IOP for further stabilization.  She  was going to pursue further evaluation and treatment for her blood pressure.  She was started on Diovan 20 mg per day and then increased to 60 mg per day.   DISCHARGE DIAGNOSES:  AXIS I:  Bipolar disorder, mixed, rapid-cycling.  Post-  traumatic stress disorder by history.  Attention-deficit hyperactivity  disorder.  AXIS II:  No diagnosis.  AXIS III:  Irritable bowel syndrome, arterial hypertension, supraventricular  tachycardia.  AXIS IV:  Moderate.  AXIS V:  GAF upon discharge 50-55.   DISCHARGE MEDICATIONS:  1.  Ativan 0.5 mg twice a day and 1 mg at night.  2.  Synthroid 75 mcg daily.  3.  Lamictal 200 mg in the morning.  4.  Risperdal 0.25 mg in the morning and 0.5 mg, 3 at bedtime.  5.  Trazodone 50 mg, 1-1/2 to 2 at night.  6.  Colace 100 mg twice a day.  7.  Diovan 40 mg, 1-1/2 daily, to make 60 mg.   FOLLOW UP:  [facility] and [facility], M.D. and follow up blood pressure with Dr. Idell Pickles.  To follow  up with IOP [facility].       IL/MEDQ  D:  10/20/2004  T:  10/21/2004  Job:  [id number]

## 2010-08-07 NOTE — Procedures (Signed)
[facility]  Patient:    [name], [name]                    MRN: [id number] Proc. Date: 07/13/00 Adm. Date:  [id number] Attending:  Louie Bun CC:         Ivin Booty, M.D.   Procedure Report  PROCEDURE:  Esophagogastroduodenoscopy with biopsy.  INDICATIONS FOR PROCEDURE:  History of hoarseness and sensation of throat tightening, as well as production of phlegm with suggestion of reflux as an etiology but with somewhat equivocal response to proton pump inhibitor. Procedure is to better assess the likelihood of gastroesophageal reflux contributing to her symptoms.  PROCEDURE:  The patient was placed in the left lateral decubitus position and placed on the pulse monitor with continuous low-flow oxygen delivered by nasal cannula.  She was sedated with 80 mg of IV Demerol and 8 mg of IV Versed.  The Olympus video colonoscope was advanced under direct vision into the oropharynx and the esophagus.  The esophagus was slightly tortuous but of normal caliber with the squamocolumnar line at 38 cm.  There was no visible hiatal hernia, ring, stricture, or other abnormality of the distal esophagus or GE junction. The stomach was entered, and a small amount of liquid secretions were suctioned from the fundus.  Retroflexed view of the cardia was unremarkable. The fundus appeared normal. Within the proximal body was seen a 1-1.2 cm round, elevated area, which was equivocal by appearance for a mucosal polyp versus a submucosal lesion such as a leiomyoma or lipoma. It was biopsied, and no fat was seen to extrude from the biopsied area to palpation with the biopsy forceps.  It was a bit firmer than what would be expected for a lipoma.  A single biopsy was taken from the lesion.  The remainder of the body and antrum appeared normal. The duodenum was entered, both bulb and second portion, and were well-inspected and appeared to be within normal limits.  The  scope was then withdrawn, and the patient returned to the recovery room in stable condition.  She tolerated the procedure well, and there were no immediate complications.  IMPRESSION: 1. Somewhat tortuous esophagus; otherwise, no abnormality of the esophagus or    gastroesophageal junction. 2. Gastric nodule, rule out polyp versus submucosal lesion.  PLAN: Await biopsy results now.  Will continue proton pump inhibitor.  If symptoms not adequately controlled, will consider a 24-hour pH study while on her proton pump inhibitor.  DD:  07/13/00 TD:  07/14/00 Job: [id number]

## 2010-08-07 NOTE — H&P (Signed)
NAME:  [name], [name]                       ACCOUNT NO.:  [id number]   MEDICAL RECORD NO.:  [id number]                   PATIENT TYPE:  EMS   LOCATION:  MAJO                                 FACILITY:  [facility]   PHYSICIAN:  Salvadore Farber, MD [facility]            DATE OF BIRTH:  [birth year]   DATE OF ADMISSION:  11/04/2001  DATE OF DISCHARGE:                                HISTORY & PHYSICAL   CHIEF COMPLAINT:  Chest pain.   HISTORY OF PRESENT ILLNESS:  The patient is a 65 year old lady who underwent  ablation of supraventricular tachycardia in 2001 by Dr. Ladona Ridgel.  She has no  history of coronary artery disease.  Her only cardiac risk factor is  borderline hypertension.  Last evening, she had approximately five seconds  of sharp substernal chest pain followed by right shoulder aching and latera  feeling unwell in my chest.  She does not describe this as pain or  dyspnea.  These symptoms resolved after a couple of hours and she slept  well.  This morning she awoke feeling well but subsequently had more right  shoulder and substernal chest aching with fleeting sharp substernal chest  pain.  She denies dyspnea, palpitations, paroxysmal nocturnal dyspnea,  orthopnea and edema.   PAST MEDICAL HISTORY:  Supraventricular tachycardia, status post ablation  (details currently unavailable).  Status post thyroidectomy in 1990.  Depression with bipolar features the past six months.  Sleep apnea status  post surgery in 2000.  Endometriosis, history of migraines, adult attention  deficit hyperactivity disorder.   ALLERGIES:  COMPAZINE and TALWIN.   CURRENT MEDICATIONS:  1. Synthroid 88 mcg per day.  2. Effexor 187.5 mg per day.  3. Trazodone 50 mg q.h.s.  4. Adderall 25 mg per day.  5. Vitamin E.   SOCIAL HISTORY:  She lives in an apartment by herself.  However, she  contacted her husband this morning.  Details of that relationship are not  available to me.  She has a 76 year old  daughter.   FAMILY HISTORY:  Father died at 2 of Jacob-Creutzfeldt disease, mother died  at 19 with myocardial infarction.  Four siblings are healthy.   HABITS:  The patient denies tobacco and alcohol use.   REVIEW OF SYSTEMS:  Negative in detail except as above.   PHYSICAL EXAMINATION:  GENERAL APPEARANCE:  This is a well-appearing woman  in no distress.  VITAL SIGNS:  Heart rate 80, blood pressure 156/82, temperature 97.6, oxygen  saturation 98% on room air.  NECK:  There is no jugular venous distension.  LUNGS:  Clear to auscultation and percussion bilaterally.  CARDIOVASCULAR:  She has a regular rate and rhythm without murmurs, rubs, or  S3.  ABDOMEN:  Obese, soft, nondistended, nontender.  There was no  hepatosplenomegaly.  Bowel sounds are normal.  EXTREMITIES:  Warm without clubbing, cyanosis, or edema.  Carotid pulses are  2+ bilaterally without bruit.  Femoral pulses  are 2+ without bruit.  Dorsal  pedis pulses are 2+ bilaterally.   LABORATORY DATA:  All are pending, including chemistries, CBC and cardiac  enzymes.   Electrocardiogram:  Normal sinus rhythm with normal EKG.   IMPRESSION/PLAN:  The patient is a 65 year old lady with borderline  hypertension who presents with rather atypical chest pain over the past 18  hours.  Will plan to rule out myocardial infarction with serial enzymes and  EKGs.  If these enzymes are negative, will proceed to stress and rest  Cardiolite in the morning.  In the meantime, will give her an aspirin  immediately and continue it daily while evaluation proceeds.                                                Salvadore Farber, MD [facility]    WED/MEDQ  D:  11/04/2001  T:  11/07/2001  Job:  [id number]
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
## 2010-08-07 NOTE — Discharge Summary (Signed)
NAME:  [name], [name]                       ACCOUNT NO.:  [id number]   MEDICAL RECORD NO.:  [id number]                   PATIENT TYPE:  INP   LOCATION:  2022                                 FACILITY:  [facility]   PHYSICIAN:  Salvadore Farber, MD [facility]            DATE OF BIRTH:  10-31-1945   DATE OF ADMISSION:  11/04/2001  DATE OF DISCHARGE:  11/05/2001                           DISCHARGE SUMMARY - REFERRING   PROCEDURE:  Cardiolite.   HOSPITAL COURSE:  The patient is a 65 year old female with known history of  coronary artery disease who does have a history of supraventricular  tachycardiac and is status post ablation in 2001.  She was admitted on  11/04/2001 for chest pain as well as fatigue, nausea, and general malaise.  The pain is a 7/10 at its worst and was described as a substernal chest pain  that lasted less than 10 seconds.  This was followed by a dull pain in her  right shoulder that lasted about a minute.  She was seen in the [facility] and her primary physician's office and was referred to [facility]  Cardiology for admission and further evaluation.   She was admitted to [facility] to rule out MI.  Her enzymes were  negative for MI and the next day she had an adenosine Cardiolite.  The  results of the adenosine Cardiolite were evaluated by Dr. Samule Ohm and it  showed no ischemia.  It was felt that her pain was atypical and not cardiac  in origin.  She was considered stable for discharge on 11/05/01.   LABORATORY DATA:  Hemoglobin 12.2, hematocrit 36.3, WBCs 3.8, platelets  204,000.  Sodium 143, potassium 4.0, chloride 109, CO2 30, BUN 9, creatinine  0.7, glucose 99.  TSH 0.281.   Chest x-ray no active disease.   DISCHARGE CONDITION:  Stable.   DISCHARGE DIAGNOSES:  1. Chest pain, negative myocardial infarction by enzymes and no ischemia by     Cardiolite, Nexium added to medication regimen for gastroesophageal     reflux disease symptoms.  The patient is to  follow up with her primary     care physician.  2. History of supraventricular tachycardia, status post ablation in 2001.     She is to see Dr. Ladona Ridgel on a p.r.n. basis only.  3. History of obstructive sleep apnea.  4. Depression.  5. Possible type 2 bipolar per Dr. Jennelle Human.  6. Endometriosis.  7. History of migraines.  8. History of attention deficit disorder.  9. History of temporal mandibular joint.  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  11)History of allergies to Compazine and Talwin.   DISCHARGE INSTRUCTIONS:  1. Her activity level is to be as tolerated.  2. She is to stick to a low-fat diet.  3. She is to follow up with Dr. Ladona Ridgel p.r.n.  4. She is to follow up with Dr. Idell Pickles and call for an appointment.  DISCHARGE MEDICATIONS:  1. Synthroid 88 mcg q.d. TSH borderline low with medication adjustments per     Dr. Idell Pickles.  2. Effexor 150 mg and 37.5 mg q.d.  3. Trazodone 50 mg q.h.s.  4. Adderall XR 25 mg q.d.  5. Omega-3 vitamin B6 and vitamin E as prior to admission.  6. Nexium 40 mg q.d.     Lavella Hammock, PA [facility]                    Salvadore Farber, MD [facility]    RG/MEDQ  D:  11/05/2001  T:  11/08/2001  Job:  [id number]   cc:   Raynelle Dick, M.D.   Doylene Canning. Ladona Ridgel, M.D. [facility]
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
## 2010-08-12 ENCOUNTER — Encounter: Payer: Self-pay | Admitting: Pulmonary Disease

## 2010-08-12 ENCOUNTER — Ambulatory Visit (INDEPENDENT_AMBULATORY_CARE_PROVIDER_SITE_OTHER): Payer: Medicare Other | Admitting: Pulmonary Disease

## 2010-08-12 DIAGNOSIS — G4733 Obstructive sleep apnea (adult) (pediatric): Secondary | ICD-10-CM

## 2010-08-12 DIAGNOSIS — G2581 Restless legs syndrome: Secondary | ICD-10-CM

## 2010-08-12 NOTE — Assessment & Plan Note (Signed)
The pt continues to struggle with tolerance of cpap, and feels it is not a viable therapy for her.  I have discussed with her a dental appliance, and she would like to consider this.  I will make the dental referral, and have encouraged her to work aggressively on weight loss.

## 2010-08-12 NOTE — Progress Notes (Signed)
  Subjective:    Patient ID: [name], female    DOB: November 21, 1945, 65 y.o.   MRN: [id number]  HPI The pt comes in today for f/u of her known osa and RLS.  She is doing well from a leg movement standpoint on her medication, but is continuing to struggle with cpap.  She feels she is intolerant to the mask and pressure, and that cpap is not a viable therapy for her.  We had discussed dental appliance in the past, and she would like to consider.    Review of Systems  Constitutional: Negative for fever and unexpected weight change.  HENT: Positive for congestion and rhinorrhea. Negative for ear pain, nosebleeds, sore throat, sneezing, trouble swallowing, dental problem, postnasal drip and sinus pressure.   Eyes: Positive for redness and itching.  Respiratory: Positive for cough, chest tightness and shortness of breath. Negative for wheezing.   Cardiovascular: Positive for palpitations. Negative for leg swelling.  Gastrointestinal: Positive for nausea. Negative for vomiting.  Genitourinary: Negative for dysuria.  Musculoskeletal: Negative for joint swelling.  Skin: Negative for rash.  Neurological: Positive for headaches.  Hematological: Bruises/bleeds easily.  Psychiatric/Behavioral: Positive for dysphoric mood. The patient is nervous/anxious.        Objective:   Physical Exam Ow female in nad No skin breakdown or pressure necrosis from cpap mask LE without significant edema, no cyanosis  Alert and oriented, moves all 4        Assessment & Plan:

## 2010-08-12 NOTE — Patient Instructions (Signed)
Will refer you to Dr. Myrtis Ser to evaluate for dental appliance.  If this goes well, please call and we can arrange for return of your cpap machine.  Continue to work on weight loss

## 2010-08-12 NOTE — Assessment & Plan Note (Signed)
Denies any issues with this while on her medication.

## 2010-09-15 ENCOUNTER — Ambulatory Visit: Payer: Self-pay | Admitting: Pulmonary Disease

## 2010-11-04 ENCOUNTER — Other Ambulatory Visit: Payer: Self-pay | Admitting: Oncology

## 2010-11-04 ENCOUNTER — Encounter (HOSPITAL_BASED_OUTPATIENT_CLINIC_OR_DEPARTMENT_OTHER): Payer: Medicare Other | Admitting: Oncology

## 2010-11-04 DIAGNOSIS — C679 Malignant neoplasm of bladder, unspecified: Secondary | ICD-10-CM

## 2010-11-04 LAB — CBC WITH DIFFERENTIAL/PLATELET
BASO%: 0.5 % (ref 0.0–2.0)
Basophils Absolute: 0 10*3/uL (ref 0.0–0.1)
EOS%: 2.8 % (ref 0.0–7.0)
Eosinophils Absolute: 0.1 10*3/uL (ref 0.0–0.5)
HCT: 41.9 % (ref 34.8–46.6)
HGB: 14.4 g/dL (ref 11.6–15.9)
LYMPH%: 26.8 % (ref 14.0–49.7)
MCH: 28.7 pg (ref 25.1–34.0)
MCHC: 34.3 g/dL (ref 31.5–36.0)
MCV: 83.7 fL (ref 79.5–101.0)
MONO#: 0.2 10*3/uL (ref 0.1–0.9)
MONO%: 4.9 % (ref 0.0–14.0)
NEUT#: 2.8 10*3/uL (ref 1.5–6.5)
NEUT%: 65 % (ref 38.4–76.8)
Platelets: 252 10*3/uL (ref 145–400)
RBC: 5.01 10*6/uL (ref 3.70–5.45)
RDW: 14.2 % (ref 11.2–14.5)
WBC: 4.4 10*3/uL (ref 3.9–10.3)
lymph#: 1.2 10*3/uL (ref 0.9–3.3)

## 2010-11-04 LAB — COMPREHENSIVE METABOLIC PANEL
ALT: 14 U/L (ref 0–35)
AST: 18 U/L (ref 0–37)
Albumin: 4.2 g/dL (ref 3.5–5.2)
Alkaline Phosphatase: 81 U/L (ref 39–117)
BUN: 18 mg/dL (ref 6–23)
CO2: 29 mEq/L (ref 19–32)
Calcium: 9.8 mg/dL (ref 8.4–10.5)
Chloride: 102 mEq/L (ref 96–112)
Creatinine, Ser: 0.91 mg/dL (ref 0.50–1.10)
Glucose, Bld: 130 mg/dL — ABNORMAL HIGH (ref 70–99)
Potassium: 3.7 mEq/L (ref 3.5–5.3)
Sodium: 139 mEq/L (ref 135–145)
Total Bilirubin: 0.5 mg/dL (ref 0.3–1.2)
Total Protein: 7.4 g/dL (ref 6.0–8.3)

## 2010-11-11 ENCOUNTER — Telehealth: Payer: Self-pay | Admitting: Pulmonary Disease

## 2010-11-11 NOTE — Telephone Encounter (Signed)
Order was already sent to [facility] 08/12/10 by [facility] for referral to Dr. Myrtis Ser.  PCCs, please advise.  Thanks.

## 2010-11-12 NOTE — Telephone Encounter (Signed)
Original referral faxed to Dr. Henrietta Hoover office on 08/12/10. Called Dr. Henrietta Hoover office to check on the original referral, however, their office had already closed for the day. I left a message on the new patient coordinator voice mail to contact me about this referral. I reprinted everything again, referral, sleep study and ov notes and faxed everything again to Dr. Henrietta Hoover office and asked them to contact patient asap to arrange.  Returned patients call, and didn't receive an answer. LMOAM for patient of the above and advised her that I have re-faxed referral again. Advised patient that Dr. Henrietta Hoover office may be closed tomorrow (friday) but have left message for them to contact her to arrange appointment. Advised patient that if she did not hear from Dr. Henrietta Hoover office by [place] 11/17/10 to call me at [phone].

## 2010-12-08 ENCOUNTER — Telehealth: Payer: Self-pay | Admitting: Pulmonary Disease

## 2010-12-08 NOTE — Telephone Encounter (Signed)
Will have to wait until  EMR is up so we can look in that system

## 2010-12-08 NOTE — Telephone Encounter (Signed)
Annice Pih says the [employer] is not approving the dental appliance because in one of the office notes from [facility] it states that the pt needed another sleep study since the last one was done in 2004. I have looked at office notes dating back to 07/2009 and see nothing that states a new sleep study was needed. Annice Pih will have the [employer] fax the note were this is supposedly written to her and will call if she needs anything further from our office. Nothing further is needed at this pont.

## 2010-12-21 ENCOUNTER — Encounter: Payer: Self-pay | Admitting: Pulmonary Disease

## 2010-12-21 ENCOUNTER — Ambulatory Visit (INDEPENDENT_AMBULATORY_CARE_PROVIDER_SITE_OTHER): Payer: Medicare Other | Admitting: Pulmonary Disease

## 2010-12-21 DIAGNOSIS — G4733 Obstructive sleep apnea (adult) (pediatric): Secondary | ICD-10-CM

## 2010-12-21 DIAGNOSIS — G2581 Restless legs syndrome: Secondary | ICD-10-CM

## 2010-12-21 NOTE — Progress Notes (Signed)
  Subjective:    Patient ID: [name], female    DOB: 22-Nov-1945, 65 y.o.   MRN: [id number]  HPI The patient comes in today for followup of her obstructive sleep apnea and RLS.  She has been intolerant of CPAP, and was referred to dental medicine for evaluation.  She has been approved for a dental appliance, and has her fitting upcoming.  She feels that her RLS symptoms are fairly well controlled on her current Requip dosing, but has been having a new discomfort in her right hip that radiates around to her back.  By her description, this does not sound like classic RLS symptoms.  She's been trying to work on weight reduction.   Review of Systems  Constitutional: Negative for fever and unexpected weight change.  HENT: Negative for ear pain, nosebleeds, congestion, sore throat, rhinorrhea, sneezing, trouble swallowing, dental problem, postnasal drip and sinus pressure.   Eyes: Negative for redness and itching.  Respiratory: Positive for shortness of breath. Negative for cough, chest tightness and wheezing.   Cardiovascular: Positive for palpitations. Negative for leg swelling.  Gastrointestinal: Positive for nausea. Negative for vomiting.  Genitourinary: Negative for dysuria.  Musculoskeletal: Negative for joint swelling.  Skin: Negative for rash.  Neurological: Positive for headaches.  Hematological: Bruises/bleeds easily.  Psychiatric/Behavioral: Positive for dysphoric mood. The patient is nervous/anxious.        Objective:   Physical Exam Ow female in nad Nose without purulence or discharge LE with minimal edema, no cyanosis noted. Alert, does not appear sleepy, moves all 4        Assessment & Plan:

## 2010-12-21 NOTE — Assessment & Plan Note (Signed)
The pt has failed cpap, and currently is being evaluated for dental appliance.  She tells me she has been approved, and will f/u with Dr. Myrtis Ser.

## 2010-12-21 NOTE — Assessment & Plan Note (Signed)
She feels the requip has helped her symptoms, but is having a new discomfort that starts in hip and radiates around the buttocks.  This is not c/w RLS discomfort, and more than likely MSK or related to neuropathic pain from spine?  She is seeing a Chiropodist, but also asked her to discuss this with primary md if continues.

## 2010-12-21 NOTE — Patient Instructions (Signed)
Continue on requip for your restless legs Let me know how you are doing with your dental appliance Work on weight loss, exercise regularly followup with me in one year.

## 2010-12-23 ENCOUNTER — Other Ambulatory Visit (HOSPITAL_COMMUNITY)
Admission: RE | Admit: 2010-12-23 | Discharge: 2010-12-23 | Disposition: A | Discharge status: Home / Self Care | Payer: Medicare Other | Source: Ambulatory Visit | Attending: Family Medicine | Admitting: Family Medicine

## 2010-12-23 ENCOUNTER — Other Ambulatory Visit: Payer: Self-pay | Admitting: Family Medicine

## 2010-12-23 DIAGNOSIS — Z124 Encounter for screening for malignant neoplasm of cervix: Secondary | ICD-10-CM | POA: Insufficient documentation

## 2011-01-01 ENCOUNTER — Ambulatory Visit: Payer: Self-pay | Admitting: Pulmonary Disease

## 2011-03-03 ENCOUNTER — Encounter: Payer: Self-pay | Admitting: *Deleted

## 2011-03-05 ENCOUNTER — Other Ambulatory Visit: Payer: Self-pay | Admitting: Oncology

## 2011-03-05 ENCOUNTER — Ambulatory Visit (HOSPITAL_BASED_OUTPATIENT_CLINIC_OR_DEPARTMENT_OTHER): Payer: Medicare Other | Admitting: Oncology

## 2011-03-05 ENCOUNTER — Other Ambulatory Visit (HOSPITAL_BASED_OUTPATIENT_CLINIC_OR_DEPARTMENT_OTHER): Payer: Medicare Other | Admitting: Lab

## 2011-03-05 VITALS — BP 123/74 | HR 96 | Temp 97.3°F | Ht 65.0 in | Wt 212.4 lb

## 2011-03-05 DIAGNOSIS — C649 Malignant neoplasm of unspecified kidney, except renal pelvis: Secondary | ICD-10-CM

## 2011-03-05 DIAGNOSIS — D4959 Neoplasm of unspecified behavior of other genitourinary organ: Secondary | ICD-10-CM

## 2011-03-05 DIAGNOSIS — C679 Malignant neoplasm of bladder, unspecified: Secondary | ICD-10-CM

## 2011-03-05 LAB — COMPREHENSIVE METABOLIC PANEL
ALT: 15 U/L (ref 0–35)
AST: 22 U/L (ref 0–37)
Albumin: 4.3 g/dL (ref 3.5–5.2)
Alkaline Phosphatase: 86 U/L (ref 39–117)
BUN: 17 mg/dL (ref 6–23)
CO2: 28 mEq/L (ref 19–32)
Calcium: 9.7 mg/dL (ref 8.4–10.5)
Chloride: 100 mEq/L (ref 96–112)
Creatinine, Ser: 0.93 mg/dL (ref 0.50–1.10)
Glucose, Bld: 219 mg/dL — ABNORMAL HIGH (ref 70–99)
Potassium: 3.9 mEq/L (ref 3.5–5.3)
Sodium: 139 mEq/L (ref 135–145)
Total Bilirubin: 0.5 mg/dL (ref 0.3–1.2)
Total Protein: 7.1 g/dL (ref 6.0–8.3)

## 2011-03-05 LAB — CBC WITH DIFFERENTIAL/PLATELET
BASO%: 1 % (ref 0.0–2.0)
Basophils Absolute: 0.1 10*3/uL (ref 0.0–0.1)
EOS%: 2.6 % (ref 0.0–7.0)
Eosinophils Absolute: 0.1 10*3/uL (ref 0.0–0.5)
HCT: 42.5 % (ref 34.8–46.6)
HGB: 14.2 g/dL (ref 11.6–15.9)
LYMPH%: 24.2 % (ref 14.0–49.7)
MCH: 27.9 pg (ref 25.1–34.0)
MCHC: 33.5 g/dL (ref 31.5–36.0)
MCV: 83.3 fL (ref 79.5–101.0)
MONO#: 0.2 10*3/uL (ref 0.1–0.9)
MONO%: 4.2 % (ref 0.0–14.0)
NEUT#: 3.5 10*3/uL (ref 1.5–6.5)
NEUT%: 68 % (ref 38.4–76.8)
Platelets: 276 10*3/uL (ref 145–400)
RBC: 5.1 10*6/uL (ref 3.70–5.45)
RDW: 13.9 % (ref 11.2–14.5)
WBC: 5.2 10*3/uL (ref 3.9–10.3)
lymph#: 1.2 10*3/uL (ref 0.9–3.3)

## 2011-03-05 NOTE — Progress Notes (Signed)
Hematology and Oncology Follow Up Visit  [name] [id number] 1945-08-05 65 y.o. 03/05/2011 10:49 AM  CC: [name] ([name]) [name], M.D.    Principle Diagnosis: This is a 64 year old female with a renal mass, presented with 4.6 x 4.4 x 3.1 cm right renal mass suspicious for a neoplasm.  The patient refused a biopsy or primary treatment.     Current therapy: Observation and surveillance  Interim History:  [name] presents today for a followup visit, a pleasant 65 year old whom I saw for the first time back in May 2012 for evaluation for renal mass, and again, that mass is suspicious for a malignancy; however, she has refused workup or treatment.  She has decided to continue with observation, which is agreeable, to follow up on an intermittent basis with me regarding that.  She does understand and continues to understand that if this is malignant in nature, it can spread and cause more morbidity and ultimately can lead to her death; however, she continues to resist any further treatment.  Since the last time I saw her, she has not reported any flank pain.  She has not reported any back pain.  She does report some occasional back discomfort related to her arthritis.  She [facility] go [facility] [place] for that.  It is not radiating.  It is not associated with any hematuria.  It is not associated with any neurological deficits.  Again, grade 1 to 2 out of 10.  She had not reported any other complaints.  Performance status and activity level remain reasonable. No hematuria or GU complaints.  Medications: I have reviewed the patient's current medications. Current outpatient prescriptions:ARMOUR THYROID PO, Take 1 tablet by mouth daily.  , Disp: , Rfl: ;  cholecalciferol (VITAMIN D) 1000 UNITS tablet, Take 1,000 Units by mouth daily.  , Disp: , Rfl: ;  DULoxetine (CYMBALTA) 30 MG capsule, Take 30 mg by mouth 2 (two) times daily. , Disp: , Rfl: ;  lamoTRIgine (LAMICTAL) 200 MG tablet, Take 200 mg by  mouth 2 (two) times daily.  , Disp: , Rfl:  LORazepam (ATIVAN) 1 MG tablet, Take 0.5 mg by mouth 4 (four) times daily. , Disp: , Rfl: ;  magnesium oxide (MAG-OX) 400 MG tablet, Take 400 mg by mouth daily.  , Disp: , Rfl: ;  rOPINIRole (REQUIP) 0.5 MG tablet, Take 1 tab after dinner and 1 to 1 1/2 tabs at bedtime., Disp: , Rfl: ;  valsartan-hydrochlorothiazide (DIOVAN-HCT) 320-25 MG per tablet, Take 1 tablet by mouth daily.  , Disp: , Rfl:   Allergies:  Allergies  Allergen Reactions  . Geodon (Ziprasidone Hydrochloride)   . Lithium   . Sulfa Antibiotics   . Toradol     Past Medical History, Surgical history, Social history, and Family History were reviewed and updated.  Review of Systems: Constitutional:  Negative for fever, chills, night sweats, anorexia, weight loss, pain. Cardiovascular: no chest pain or dyspnea on exertion Respiratory: no cough, shortness of breath, or wheezing Neurological: no TIA or stroke symptoms Dermatological: negative ENT: negative Skin: Negative. Gastrointestinal: no abdominal pain, change in bowel habits, or black or bloody stools Genito-Urinary: no dysuria, trouble voiding, or hematuria Hematological and Lymphatic: negative Breast: negative Musculoskeletal: negative Remaining ROS negative. Physical Exam: Blood pressure 123/74, pulse 96, temperature 97.3 F (36.3 C), height 5\' 5"  (1.651 m), weight 212 lb 6.4 oz (96.344 kg). ECOG: 1 General appearance: alert Head: Normocephalic, without obvious abnormality, atraumatic Neck: no adenopathy, no carotid bruit, no JVD,  supple, symmetrical, trachea midline and thyroid not enlarged, symmetric, no tenderness/mass/nodules Lymph nodes: Cervical, supraclavicular, and axillary nodes normal. Heart:regular rate and rhythm, S1, S2 normal, no murmur, click, rub or gallop Lung:chest clear, no wheezing, rales, normal symmetric air entry Abdomin: soft, non-tender, without masses or organomegaly EXT:no erythema,  induration, or nodules   Lab Results: Lab Results  Component Value Date   WBC 5.2 03/05/2011   HGB 14.2 03/05/2011   HCT 42.5 03/05/2011   MCV 83.3 03/05/2011   PLT 276 03/05/2011     Chemistry      Component Value Date/Time   NA 139 11/04/2010 1041   K 3.7 11/04/2010 1041   CL 102 11/04/2010 1041   CO2 29 11/04/2010 1041   BUN 18 11/04/2010 1041   CREATININE 0.91 11/04/2010 1041      Component Value Date/Time   CALCIUM 9.8 11/04/2010 1041   ALKPHOS 81 11/04/2010 1041   AST 18 11/04/2010 1041   ALT 14 11/04/2010 1041   BILITOT 0.5 11/04/2010 1041          Impression and Plan:   66 year old female with the following issues:  1. A renal mass on the right side very suspicious for malignancy.  I had another  discussion today discussing the ramification of not getting this tumor treated including local pain, hematuria, metastasis and ultimately more pain and possibly death from metastatic cancer.  She has continued to be adamant in refusing any further treatment.  At this point, we will proceed with a more palliative approach.  If she develops any symptoms, then we will address that as they arise.  At this point, she does not have any symptoms and no reason for any intervention. I will restage her with CT scan before the next visit.  2. Depression.  This seems to be under reasonable control.  Follow as per her primary care physician.   3. Followup will be in 3 months' time.     [facility], MD 12/14/201210:49 AM

## 2011-04-07 ENCOUNTER — Telehealth: Payer: Self-pay | Admitting: Pulmonary Disease

## 2011-04-07 NOTE — Telephone Encounter (Signed)
Will forward to KC as an FYI 

## 2011-04-07 NOTE — Telephone Encounter (Signed)
Noted  

## 2011-06-02 ENCOUNTER — Other Ambulatory Visit (HOSPITAL_BASED_OUTPATIENT_CLINIC_OR_DEPARTMENT_OTHER): Payer: Medicare Other | Admitting: Lab

## 2011-06-02 ENCOUNTER — Ambulatory Visit (HOSPITAL_COMMUNITY)
Admission: RE | Admit: 2011-06-02 | Discharge: 2011-06-02 | Disposition: A | Discharge status: Home / Self Care | Payer: Medicare Other | Source: Ambulatory Visit | Attending: Oncology | Admitting: Oncology

## 2011-06-02 DIAGNOSIS — Q619 Cystic kidney disease, unspecified: Secondary | ICD-10-CM | POA: Insufficient documentation

## 2011-06-02 DIAGNOSIS — C649 Malignant neoplasm of unspecified kidney, except renal pelvis: Secondary | ICD-10-CM

## 2011-06-02 DIAGNOSIS — N289 Disorder of kidney and ureter, unspecified: Secondary | ICD-10-CM | POA: Insufficient documentation

## 2011-06-02 DIAGNOSIS — R109 Unspecified abdominal pain: Secondary | ICD-10-CM | POA: Insufficient documentation

## 2011-06-02 DIAGNOSIS — I2510 Atherosclerotic heart disease of native coronary artery without angina pectoris: Secondary | ICD-10-CM | POA: Insufficient documentation

## 2011-06-02 DIAGNOSIS — D739 Disease of spleen, unspecified: Secondary | ICD-10-CM | POA: Insufficient documentation

## 2011-06-02 DIAGNOSIS — I700 Atherosclerosis of aorta: Secondary | ICD-10-CM | POA: Insufficient documentation

## 2011-06-02 LAB — CBC WITH DIFFERENTIAL/PLATELET
BASO%: 0.4 % (ref 0.0–2.0)
Basophils Absolute: 0 10*3/uL (ref 0.0–0.1)
EOS%: 2.9 % (ref 0.0–7.0)
Eosinophils Absolute: 0.1 10*3/uL (ref 0.0–0.5)
HCT: 42.1 % (ref 34.8–46.6)
HGB: 14 g/dL (ref 11.6–15.9)
LYMPH%: 28.1 % (ref 14.0–49.7)
MCH: 27.6 pg (ref 25.1–34.0)
MCHC: 33.3 g/dL (ref 31.5–36.0)
MCV: 82.8 fL (ref 79.5–101.0)
MONO#: 0.3 10*3/uL (ref 0.1–0.9)
MONO%: 6.3 % (ref 0.0–14.0)
NEUT#: 2.9 10*3/uL (ref 1.5–6.5)
NEUT%: 62.3 % (ref 38.4–76.8)
Platelets: 282 10*3/uL (ref 145–400)
RBC: 5.09 10*6/uL (ref 3.70–5.45)
RDW: 14.1 % (ref 11.2–14.5)
WBC: 4.7 10*3/uL (ref 3.9–10.3)
lymph#: 1.3 10*3/uL (ref 0.9–3.3)

## 2011-06-02 LAB — CMP (CANCER CENTER ONLY)
ALT(SGPT): 27 U/L (ref 10–47)
AST: 25 U/L (ref 11–38)
Albumin: 3.7 g/dL (ref 3.3–5.5)
Alkaline Phosphatase: 90 U/L — ABNORMAL HIGH (ref 26–84)
BUN, Bld: 14 mg/dL (ref 7–22)
CO2: 29 mEq/L (ref 18–33)
Calcium: 9.4 mg/dL (ref 8.0–10.3)
Chloride: 95 mEq/L — ABNORMAL LOW (ref 98–108)
Creat: 0.8 mg/dl (ref 0.6–1.2)
Glucose, Bld: 118 mg/dL (ref 73–118)
Potassium: 3.9 mEq/L (ref 3.3–4.7)
Sodium: 143 mEq/L (ref 128–145)
Total Bilirubin: 0.7 mg/dl (ref 0.20–1.60)
Total Protein: 7.7 g/dL (ref 6.4–8.1)

## 2011-06-02 MED ORDER — IOHEXOL 300 MG/ML  SOLN
100.0000 mL | Freq: Once | INTRAMUSCULAR | Status: AC | PRN
  Administered 2011-06-02: 100 mL via INTRAVENOUS

## 2011-06-04 ENCOUNTER — Ambulatory Visit: Payer: Medicare Other | Admitting: Oncology

## 2011-06-09 ENCOUNTER — Telehealth: Payer: Self-pay | Admitting: Oncology

## 2011-06-09 NOTE — Telephone Encounter (Signed)
Pt called today to r/s 3/15 appt. Pt was given new appt for 3/29 @ 1:30 pm.

## 2011-06-18 ENCOUNTER — Telehealth: Payer: Self-pay | Admitting: Oncology

## 2011-06-18 ENCOUNTER — Ambulatory Visit (HOSPITAL_BASED_OUTPATIENT_CLINIC_OR_DEPARTMENT_OTHER): Payer: Medicare Other | Admitting: Oncology

## 2011-06-18 VITALS — BP 130/77 | HR 99 | Temp 97.5°F | Ht 65.0 in | Wt 218.9 lb

## 2011-06-18 DIAGNOSIS — C649 Malignant neoplasm of unspecified kidney, except renal pelvis: Secondary | ICD-10-CM

## 2011-06-18 DIAGNOSIS — I10 Essential (primary) hypertension: Secondary | ICD-10-CM

## 2011-06-18 DIAGNOSIS — N289 Disorder of kidney and ureter, unspecified: Secondary | ICD-10-CM

## 2011-06-18 NOTE — Progress Notes (Signed)
Hematology and Oncology Follow Up Visit  [name] [id number] [birth year] 66 y.o. 06/18/2011 2:05 PM  CC: [name] ([name]) [name], M.D.    Principle Diagnosis: This is a 66 year old female with a renal mass, presented with 4.6 x 4.4 x 3.1 cm right renal mass suspicious for a neoplasm.  The patient refused a biopsy or primary treatment.   Current therapy: Observation and surveillance  Interim History:  [name] presents today for a followup visit, a pleasant 66 year old whom I saw for the first time back in May 2012 for evaluation for renal mass, and again, that mass is suspicious for a malignancy; however, she has refused workup or treatment.  She has decided to continue with observation, which is agreeable, to follow up on an intermittent basis with me regarding that. Since the last time I saw her, she has not reported any flank pain.  She has not reported any back pain.  She does report some occasional back discomfort related to her arthritis.  She does go to a [place] for that.  It is not radiating.  It is not associated with any hematuria.  It is not associated with any neurological deficits.  Again, grade 1 to 2 out of 10.  She had not reported any other complaints.  Performance status and activity level remain reasonable.  She is reporting that her depression is better in the last few days but was much worse last month. She is under the care of a psychiatrist.   Medications: I have reviewed the patient's current medications. Current outpatient prescriptions:ARMOUR THYROID PO, Take 1 tablet by mouth daily.  , Disp: , Rfl: ;  cholecalciferol (VITAMIN D) 1000 UNITS tablet, Take 1,000 Units by mouth daily.  , Disp: , Rfl: ;  DULoxetine (CYMBALTA) 30 MG capsule, Take 30 mg by mouth 2 (two) times daily. , Disp: , Rfl: ;  lamoTRIgine (LAMICTAL) 200 MG tablet, Take 200 mg by mouth 2 (two) times daily.  , Disp: , Rfl:  LORazepam (ATIVAN) 1 MG tablet, Take 0.5 mg by mouth 4 (four) times  daily. , Disp: , Rfl: ;  magnesium oxide (MAG-OX) 400 MG tablet, Take 400 mg by mouth daily.  , Disp: , Rfl: ;  rOPINIRole (REQUIP) 0.5 MG tablet, Take 1 tab after dinner and 1 to 1 1/2 tabs at bedtime., Disp: , Rfl: ;  valsartan-hydrochlorothiazide (DIOVAN-HCT) 320-25 MG per tablet, Take 1 tablet by mouth daily.  , Disp: , Rfl:   Allergies:  Allergies  Allergen Reactions  . Geodon (Ziprasidone Hydrochloride)   . Lithium   . Sulfa Antibiotics   . Toradol     Past Medical History, Surgical history, Social history, and Family History were reviewed and updated.  Review of Systems: Constitutional:  Negative for fever, chills, night sweats, anorexia, weight loss, pain. Cardiovascular: no chest pain or dyspnea on exertion Respiratory: no cough, shortness of breath, or wheezing Neurological: no TIA or stroke symptoms Dermatological: negative ENT: negative Skin: Negative. Gastrointestinal: no abdominal pain, change in bowel habits, or black or bloody stools Genito-Urinary: no dysuria, trouble voiding, or hematuria Hematological and Lymphatic: negative Breast: negative Musculoskeletal: negative Remaining ROS negative. Physical Exam: Blood pressure 130/77, pulse 99, temperature 97.5 F (36.4 C), temperature source Oral, height 5\' 5"  (1.651 m), weight 218 lb 14.4 oz (99.292 kg). ECOG: 1 General appearance: alert Head: Normocephalic, without obvious abnormality, atraumatic Neck: no adenopathy, no carotid bruit, no JVD, supple, symmetrical, trachea midline and thyroid not enlarged, symmetric, no tenderness/mass/nodules Lymph nodes:  Cervical, supraclavicular, and axillary nodes normal. Heart:regular rate and rhythm, S1, S2 normal, no murmur, click, rub or gallop Lung:chest clear, no wheezing, rales, normal symmetric air entry Abdomin: soft, non-tender, without masses or organomegaly EXT:no erythema, induration, or nodules   Lab Results: Lab Results  Component Value Date   WBC 4.7  06/02/2011   HGB 14.0 06/02/2011   HCT 42.1 06/02/2011   MCV 82.8 06/02/2011   PLT 282 06/02/2011     Chemistry      Component Value Date/Time   NA 143 06/02/2011 1008   NA 139 03/05/2011 1016   K 3.9 06/02/2011 1008   K 3.9 03/05/2011 1016   CL 95* 06/02/2011 1008   CL 100 03/05/2011 1016   CO2 29 06/02/2011 1008   CO2 28 03/05/2011 1016   BUN 14 06/02/2011 1008   BUN 17 03/05/2011 1016   CREATININE 0.8 06/02/2011 1008   CREATININE 0.93 03/05/2011 1016      Component Value Date/Time   CALCIUM 9.4 06/02/2011 1008   CALCIUM 9.7 03/05/2011 1016   ALKPHOS 90* 06/02/2011 1008   ALKPHOS 86 03/05/2011 1016   AST 25 06/02/2011 1008   AST 22 03/05/2011 1016   ALT 15 03/05/2011 1016   BILITOT 0.70 06/02/2011 1008   BILITOT 0.5 03/05/2011 1016      CT CHEST, ABDOMEN AND PELVIS WITH CONTRAST  Technique: Multidetector CT imaging of the chest, abdomen and  pelvis was performed following the standard protocol during bolus  administration of intravenous contrast.  Contrast: OMNIPAQUE IOHEXOL 300 MG/ML IJ SOLN  Comparison: CT abdomen dated 01/15/2010  CT CHEST  Findings: No suspicious pulmonary nodules. Mild dependent  atelectasis in the bilateral lower lobes. Trace pleural effusions.  Visualized thyroid is mildly heterogeneous.  The heart is normal in size. No pericardial effusion. Coronary  atherosclerosis.  No suspicious mediastinal, hilar, or axillary lymphadenopathy.  Mild degenerative changes of the thoracic spine.  IMPRESSION:  No evidence of metastatic disease in the chest.  Trace pleural effusions.  CT ABDOMEN AND PELVIS  Findings: 5.4 x 5.7 x 5.4 cm enhancing lateral interpolar right  renal mass, compatible with solid renal neoplasm. No renal vein  invasion. Single renal artery. Recruitment of an accessory renal  vein (series 2/image 68). No suspicious regional lymphadenopathy.  Liver is notable for suspected hepatic steatosis.  Scattered splenic lesions, unchanged, likely  benign.  Pancreas and adrenal glands are within normal limits.  Additional bilateral renal cysts. No hydronephrosis.  No evidence of bowel obstruction. Normal appendix.  Atherosclerotic calcifications of the abdominal aorta and branch  vessels.  No abdominopelvic ascites.  Small upper abdominal lymph nodes which are not pathologically  enlarged. No suspicious abdominopelvic lymphadenopathy.  Suspected uterine fibroids measuring up to 4.2 x 3.7 cm (series  2/image laterally). Ovaries are unremarkable.  Bladder is underdistended.  Visualized osseous structures are within normal limits.  IMPRESSION:  5.7 cm mass in the interpolar right kidney, compatible with solid  renal neoplasm, increased.  No evidence of metastatic disease in the abdomen/pelvis.  Additional ancillary findings as above.     Impression and Plan:   66 year old female with the following issues:  1. A renal mass on the right side very suspicious for malignancy.  I had another  discussion today discussing the ramification of not getting this tumor treated including local pain, hematuria, metastasis and ultimately more pain and possibly death from metastatic cancer.  She has continued to be adamant in refusing any further treatment.  At  this point, we will proceed with a more palliative approach.  If she develops any symptoms, then we will address that as they arise.  At this point, she does not have any symptoms and no reason for any intervention. Her CT scan reviewed in details with her today.  2. Depression.  This seems to be under reasonable control.     3. Followup will be in 4 months' time.     Eli Hose, MD 3/29/20132:05 PM

## 2011-06-18 NOTE — Telephone Encounter (Signed)
gv pt appt for [id number] 

## 2011-06-24 ENCOUNTER — Telehealth: Payer: Self-pay

## 2011-06-24 NOTE — Telephone Encounter (Signed)
Received message from [place], [place] with Dr. Tenny Craw at [facility] stating that pt informed them that this office has a copy of her DNR order, and they would like a formal copy faxed to them.  Per Dr. Clelia Croft, he signed an out of facility DNR order for pt, and she has this information, not this office.  Called Christy back [phone] and informed her of this, and she verbalizes understanding.

## 2011-07-18 ENCOUNTER — Other Ambulatory Visit: Payer: Self-pay | Admitting: Pulmonary Disease

## 2011-09-01 ENCOUNTER — Encounter: Payer: Self-pay | Admitting: Pulmonary Disease

## 2011-09-01 ENCOUNTER — Ambulatory Visit (INDEPENDENT_AMBULATORY_CARE_PROVIDER_SITE_OTHER): Payer: Medicare Other | Admitting: Pulmonary Disease

## 2011-09-01 VITALS — BP 120/86 | HR 90 | Temp 98.2°F | Ht 65.0 in | Wt 220.8 lb

## 2011-09-01 DIAGNOSIS — G4733 Obstructive sleep apnea (adult) (pediatric): Secondary | ICD-10-CM

## 2011-09-01 DIAGNOSIS — G2581 Restless legs syndrome: Secondary | ICD-10-CM

## 2011-09-01 NOTE — Assessment & Plan Note (Signed)
The patient is doing well with Requip, but occasionally will have to take an extra dose.  She tells me that she is doing no exercise currently, and I have reminded her this is a standard treatment for RLS.  I have asked her to try and get 45 minutes of some type of exercise daily.

## 2011-09-01 NOTE — Patient Instructions (Addendum)
Continue with dental appliance No change in requip dosing for now, but try and get 45 min of exercise daily.  This is one of the treatments for RLS. followup with me in one year if doing well.

## 2011-09-01 NOTE — Assessment & Plan Note (Signed)
The patient is doing well with her dental appliance, and feels that her sleep has definitely improved.  She is not having any tolerance issues with the device.

## 2011-09-01 NOTE — Progress Notes (Signed)
  Subjective:    Patient ID: [name], female    DOB: 1946/02/09, 66 y.o.   MRN: [id number]  HPI The patient comes in today for followup of her known sleep apnea and also restless leg syndrome.  She has been fitted with a dental appliance, and is doing very well with this.  She is having no tolerance issues, and feels that she is sleeping better.  She has been taking her Requip compliantly, and feels it does a very good job with her leg movements.  She does have some breakthrough in the afternoons, but admits that she is not doing any type of exercise.   Review of Systems  Constitutional: Negative.  Negative for fever and unexpected weight change.  HENT: Positive for sneezing. Negative for ear pain, nosebleeds, congestion, sore throat, rhinorrhea, trouble swallowing, dental problem, postnasal drip and sinus pressure.   Eyes: Negative.  Negative for redness and itching.  Respiratory: Positive for shortness of breath. Negative for cough, chest tightness and wheezing.   Cardiovascular: Negative.  Negative for palpitations and leg swelling.  Gastrointestinal: Negative.  Negative for nausea and vomiting.  Genitourinary: Negative.  Negative for dysuria.  Musculoskeletal: Negative.  Negative for joint swelling.  Skin: Negative.  Negative for rash.  Neurological: Positive for headaches.  Hematological: Negative.  Does not bruise/bleed easily.  Psychiatric/Behavioral: Negative.  Negative for dysphoric mood. The patient is not nervous/anxious.        Objective:   Physical Exam Overweight female in no acute distress Nose without purulence or discharge noted Lower extremities with no significant edema, no cyanosis Alert and oriented, moves all 4 extremities.       Assessment & Plan:

## 2011-10-19 ENCOUNTER — Telehealth: Payer: Self-pay | Admitting: Oncology

## 2011-10-19 ENCOUNTER — Ambulatory Visit (HOSPITAL_BASED_OUTPATIENT_CLINIC_OR_DEPARTMENT_OTHER): Payer: Medicare Other | Admitting: Oncology

## 2011-10-19 ENCOUNTER — Other Ambulatory Visit (HOSPITAL_BASED_OUTPATIENT_CLINIC_OR_DEPARTMENT_OTHER): Payer: Medicare Other | Admitting: Lab

## 2011-10-19 VITALS — BP 133/87 | HR 85 | Temp 97.4°F | Ht 65.0 in | Wt 220.3 lb

## 2011-10-19 DIAGNOSIS — F3289 Other specified depressive episodes: Secondary | ICD-10-CM

## 2011-10-19 DIAGNOSIS — Z5320 Procedure and treatment not carried out because of patient's decision for unspecified reasons: Secondary | ICD-10-CM

## 2011-10-19 DIAGNOSIS — C649 Malignant neoplasm of unspecified kidney, except renal pelvis: Secondary | ICD-10-CM

## 2011-10-19 DIAGNOSIS — N289 Disorder of kidney and ureter, unspecified: Secondary | ICD-10-CM

## 2011-10-19 DIAGNOSIS — F329 Major depressive disorder, single episode, unspecified: Secondary | ICD-10-CM

## 2011-10-19 LAB — COMPREHENSIVE METABOLIC PANEL
ALT: 17 U/L (ref 0–35)
AST: 20 U/L (ref 0–37)
Albumin: 4.3 g/dL (ref 3.5–5.2)
Alkaline Phosphatase: 93 U/L (ref 39–117)
BUN: 14 mg/dL (ref 6–23)
CO2: 29 mEq/L (ref 19–32)
Calcium: 9.7 mg/dL (ref 8.4–10.5)
Chloride: 101 mEq/L (ref 96–112)
Creatinine, Ser: 0.9 mg/dL (ref 0.50–1.10)
Glucose, Bld: 137 mg/dL — ABNORMAL HIGH (ref 70–99)
Potassium: 3.9 mEq/L (ref 3.5–5.3)
Sodium: 139 mEq/L (ref 135–145)
Total Bilirubin: 0.5 mg/dL (ref 0.3–1.2)
Total Protein: 7.2 g/dL (ref 6.0–8.3)

## 2011-10-19 LAB — CBC WITH DIFFERENTIAL/PLATELET
BASO%: 0.4 % (ref 0.0–2.0)
Basophils Absolute: 0 10*3/uL (ref 0.0–0.1)
EOS%: 3.1 % (ref 0.0–7.0)
Eosinophils Absolute: 0.2 10*3/uL (ref 0.0–0.5)
HCT: 40.9 % (ref 34.8–46.6)
HGB: 13.6 g/dL (ref 11.6–15.9)
LYMPH%: 26.4 % (ref 14.0–49.7)
MCH: 27.5 pg (ref 25.1–34.0)
MCHC: 33.4 g/dL (ref 31.5–36.0)
MCV: 82.4 fL (ref 79.5–101.0)
MONO#: 0.3 10*3/uL (ref 0.1–0.9)
MONO%: 5.4 % (ref 0.0–14.0)
NEUT#: 3.3 10*3/uL (ref 1.5–6.5)
NEUT%: 64.7 % (ref 38.4–76.8)
Platelets: 264 10*3/uL (ref 145–400)
RBC: 4.96 10*6/uL (ref 3.70–5.45)
RDW: 14.8 % — ABNORMAL HIGH (ref 11.2–14.5)
WBC: 5 10*3/uL (ref 3.9–10.3)
lymph#: 1.3 10*3/uL (ref 0.9–3.3)

## 2011-10-19 LAB — LACTATE DEHYDROGENASE: LDH: 160 U/L (ref 94–250)

## 2011-10-19 NOTE — Telephone Encounter (Signed)
appts made and printed for pt aom °

## 2011-10-19 NOTE — Progress Notes (Signed)
Hematology and Oncology Follow Up Visit  [name] [id number] 07-May-1945 66 y.o. 10/19/2011 10:26 AM  CC: [name] ([name]) Tenny Craw, M.D.    Principle Diagnosis: This is a 66 year old female with a renal mass, presented with 4.6 x 4.4 x 3.1 cm right renal mass suspicious for a neoplasm.  The patient refused a biopsy or primary treatment. Diagnosed in 2011.   Current therapy: Observation and surveillance  Interim History:  [name] presents today for a followup visit, a pleasant 66 year old whom I saw for the first time back in May 2012 for evaluation for renal mass, and again, that mass is suspicious for a malignancy; however, she has refused workup or treatment.  She has decided to continue with observation, and agreeable to follow up on an intermittent basis with me regarding that. Since the last time I saw her, she has not reported any flank pain.  She has not reported any back pain.  She does report some occasional back discomfort related to her arthritis.  She does go to a [place] for that.  It is not radiating.  It is not associated with any hematuria.  It is not associated with any neurological deficits.   Performance status and activity level remain reasonable.  She is reporting that her depression is better in the last few days but was much worse last month. She is under the care of a psychiatrist.   Medications: I have reviewed the patient's current medications. Current outpatient prescriptions:ARMOUR THYROID PO, Take 1 tablet by mouth daily.  , Disp: , Rfl: ;  cholecalciferol (VITAMIN D) 1000 UNITS tablet, Take 1,000 Units by mouth daily.  , Disp: , Rfl: ;  DULoxetine (CYMBALTA) 30 MG capsule, Take 30 mg by mouth 2 (two) times daily. , Disp: , Rfl: ;  LamoTRIgine 300 MG TB24, Take 1 tablet by mouth daily., Disp: , Rfl:  LORazepam (ATIVAN) 1 MG tablet, Take 1 mg by mouth 3 (three) times daily. , Disp: , Rfl: ;  magnesium oxide (MAG-OX) 400 MG tablet, Take 400 mg by mouth daily.  ,  Disp: , Rfl: ;  PROMETHAZINE HCL PO, Take 1 tablet by mouth as needed., Disp: , Rfl: ;  rOPINIRole (REQUIP) 0.5 MG tablet, TAKE 1 TABLET BY MOUTH LATE IN THE AFTERNOON AND 2 TABLETS BY MOUTH AFTER DINNER, Disp: 90 tablet, Rfl: 4 traMADol (ULTRAM) 50 MG tablet, Take 50 mg by mouth as needed., Disp: , Rfl: ;  traZODone (DESYREL) 50 MG tablet, Take 50 mg by mouth at bedtime as needed., Disp: , Rfl: ;  valsartan-hydrochlorothiazide (DIOVAN-HCT) 320-25 MG per tablet, Take 1 tablet by mouth daily.  , Disp: , Rfl:   Allergies:  Allergies  Allergen Reactions  . Geodon (Ziprasidone Hydrochloride)   . Ketorolac Tromethamine   . Lithium   . Sulfa Antibiotics   . Talwin (Pentazocine)     Past Medical History, Surgical history, Social history, and Family History were reviewed and updated.  Review of Systems: Constitutional:  Negative for fever, chills, night sweats, anorexia, weight loss, pain. Cardiovascular: no chest pain or dyspnea on exertion Respiratory: no cough, shortness of breath, or wheezing Neurological: no TIA or stroke symptoms Dermatological: negative ENT: negative Skin: Negative. Gastrointestinal: no abdominal pain, change in bowel habits, or black or bloody stools Genito-Urinary: no dysuria, trouble voiding, or hematuria Hematological and Lymphatic: negative Breast: negative Musculoskeletal: negative Remaining ROS negative. Physical Exam: Blood pressure 133/87, pulse 85, temperature 97.4 F (36.3 C), temperature source Oral, height 5\' 5"  (1.651  m), weight 220 lb 4.8 oz (99.927 kg). ECOG: 1 General appearance: alert Head: Normocephalic, without obvious abnormality, atraumatic Neck: no adenopathy, no carotid bruit, no JVD, supple, symmetrical, trachea midline and thyroid not enlarged, symmetric, no tenderness/mass/nodules Lymph nodes: Cervical, supraclavicular, and axillary nodes normal. Heart:regular rate and rhythm, S1, S2 normal, no murmur, click, rub or gallop Lung:chest  clear, no wheezing, rales, normal symmetric air entry Abdomin: soft, non-tender, without masses or organomegaly EXT:no erythema, induration, or nodules   Lab Results: Lab Results  Component Value Date   WBC 5.0 10/19/2011   HGB 13.6 10/19/2011   HCT 40.9 10/19/2011   MCV 82.4 10/19/2011   PLT 264 10/19/2011     Chemistry      Component Value Date/Time   NA 143 06/02/2011 1008   NA 139 03/05/2011 1016   K 3.9 06/02/2011 1008   K 3.9 03/05/2011 1016   CL 95* 06/02/2011 1008   CL 100 03/05/2011 1016   CO2 29 06/02/2011 1008   CO2 28 03/05/2011 1016   BUN 14 06/02/2011 1008   BUN 17 03/05/2011 1016   CREATININE 0.8 06/02/2011 1008   CREATININE 0.93 03/05/2011 1016      Component Value Date/Time   CALCIUM 9.4 06/02/2011 1008   CALCIUM 9.7 03/05/2011 1016   ALKPHOS 90* 06/02/2011 1008   ALKPHOS 86 03/05/2011 1016   AST 25 06/02/2011 1008   AST 22 03/05/2011 1016   ALT 15 03/05/2011 1016   BILITOT 0.70 06/02/2011 1008   BILITOT 0.5 03/05/2011 1016      Impression and Plan:   66 year old female with the following issues:  1. A renal mass on the right side very suspicious for malignancy.  I had another  discussion today discussing the ramification of not getting this tumor treated including local pain, hematuria, metastasis and ultimately more pain and possibly death from metastatic cancer.  She has continued to be adamant in refusing any further treatment.  At this point, we will proceed with a more palliative approach.  If she develops any symptoms, then we will address that as they arise.  At this point, she does not have any symptoms and no reason for any intervention.  2. Depression.  This seems to be under reasonable control.     3. Followup will be in 4 months' time.     [facility], MD 7/30/201310:26 AM

## 2011-10-27 ENCOUNTER — Ambulatory Visit: Payer: Medicare Other | Admitting: Cardiovascular Disease

## 2011-11-26 ENCOUNTER — Encounter: Payer: Self-pay | Admitting: *Deleted

## 2011-11-26 NOTE — Progress Notes (Signed)
[facility]  Clinical Social Work  Clinical Social Work received call from patient requesting information on finding housing. Ms. Geary states she will be moving out of her apartment and is having financial difficulty.  CSW advised patient to contact [employer] for support and guidance.  Kathrin Penner, MSW, LCSW Clinical Social Worker [facility] [phone]

## 2011-12-17 ENCOUNTER — Ambulatory Visit: Payer: Medicare Other | Attending: Family Medicine | Admitting: Physical Therapy

## 2011-12-22 ENCOUNTER — Ambulatory Visit: Payer: Medicare Other | Admitting: Pulmonary Disease

## 2011-12-30 ENCOUNTER — Ambulatory Visit: Payer: Medicare Other | Attending: Family Medicine | Admitting: Physical Therapy

## 2011-12-30 DIAGNOSIS — M25619 Stiffness of unspecified shoulder, not elsewhere classified: Secondary | ICD-10-CM | POA: Insufficient documentation

## 2011-12-30 DIAGNOSIS — M25519 Pain in unspecified shoulder: Secondary | ICD-10-CM | POA: Insufficient documentation

## 2011-12-30 DIAGNOSIS — IMO0001 Reserved for inherently not codable concepts without codable children: Secondary | ICD-10-CM | POA: Insufficient documentation

## 2011-12-31 ENCOUNTER — Ambulatory Visit: Payer: Medicare Other | Admitting: Physical Therapy

## 2012-01-03 ENCOUNTER — Ambulatory Visit: Payer: Medicare Other | Admitting: Physical Therapy

## 2012-01-06 ENCOUNTER — Ambulatory Visit: Payer: Medicare Other | Admitting: Physical Therapy

## 2012-01-10 ENCOUNTER — Ambulatory Visit: Payer: Medicare Other | Admitting: Physical Therapy

## 2012-01-11 ENCOUNTER — Ambulatory Visit (INDEPENDENT_AMBULATORY_CARE_PROVIDER_SITE_OTHER): Payer: Medicare Other | Admitting: Pulmonary Disease

## 2012-01-11 ENCOUNTER — Encounter: Payer: Self-pay | Admitting: Pulmonary Disease

## 2012-01-11 ENCOUNTER — Telehealth: Payer: Self-pay | Admitting: Pulmonary Disease

## 2012-01-11 VITALS — BP 112/78 | HR 83 | Temp 98.0°F | Ht 64.5 in | Wt 221.0 lb

## 2012-01-11 DIAGNOSIS — G4733 Obstructive sleep apnea (adult) (pediatric): Secondary | ICD-10-CM

## 2012-01-11 DIAGNOSIS — G2581 Restless legs syndrome: Secondary | ICD-10-CM

## 2012-01-11 NOTE — Telephone Encounter (Signed)
These allergies have been added to her allergy list. Will sign off message

## 2012-01-11 NOTE — Assessment & Plan Note (Signed)
The patient has done well in the past on Requip, however recently she has noticed her symptoms occurring in the afternoon.  She also feels her symptoms are more significant.  I have asked her to try and take a dose of Requip in the afternoon, and then her 1 mg in the evenings after dinner.  She will try this and let me know if things are not improved.  At that point to try horizant.  I reminded her of the importance of exercise in the treatment of RLS.

## 2012-01-11 NOTE — Progress Notes (Signed)
  Subjective:    Patient ID: [name], female    DOB: [birth year], 66 y.o.   MRN: [id number]  HPI The patient comes in today for followup of her obstructive sleep apnea and restless leg syndrome.  She is now using a dental appliance for her sleep disorder breathing, and has seen significant improvement in her symptoms and sleep.  She is having no issues with the device.  She also has a history of restless leg syndrome, and is taking Requip.  At the last visit, I suggested that she take a dose in the afternoon for her early symptoms, but she has never done this.  Today, she complains of having symptoms earlier in the afternoon, and she feels overall that her symptoms are worse.   Review of Systems  Constitutional: Negative for fever and unexpected weight change.  HENT: Positive for congestion and rhinorrhea. Negative for ear pain, nosebleeds, sore throat, sneezing, trouble swallowing, dental problem and sinus pressure.   Eyes: Negative for redness and itching.  Respiratory: Positive for shortness of breath and wheezing. Negative for cough and chest tightness.   Cardiovascular: Negative for palpitations and leg swelling.  Gastrointestinal: Negative for nausea and vomiting.  Genitourinary: Negative for dysuria.  Musculoskeletal: Negative for joint swelling.  Skin: Negative for rash.  Neurological: Positive for headaches.  Hematological: Bruises/bleeds easily.  Psychiatric/Behavioral: Positive for dysphoric mood. The patient is nervous/anxious.        Objective:   Physical Exam Obese female in no acute distress Nose with purulent discharge noted Neck without lymphadenopathy or thyromegaly Lower extremities with minimal edema, no cyanosis Alert and oriented, does not appear to be sleepy, moves all 4 extremities.       Assessment & Plan:

## 2012-01-11 NOTE — Assessment & Plan Note (Signed)
The patient is using a dental appliance, and has done very well with this.  She is sleeping longer at night and feeling more refreshed upon arising.  I have asked her to work aggressively on weight loss.

## 2012-01-11 NOTE — Patient Instructions (Addendum)
Try taking one requip in the afternoon, then take 2 after dinner.  See how that goes.  If you feel your symptoms are still not being adequately controlled, let me know and we can consider horizant.   Continue with dental appliance for your sleep apnea, and work on weight loss If doing well, followup with me in one year.

## 2012-01-13 ENCOUNTER — Ambulatory Visit: Payer: Medicare Other | Admitting: Physical Therapy

## 2012-01-17 ENCOUNTER — Ambulatory Visit: Payer: Medicare Other

## 2012-01-20 ENCOUNTER — Ambulatory Visit: Payer: Medicare Other | Admitting: Physical Therapy

## 2012-02-15 ENCOUNTER — Telehealth: Payer: Self-pay | Admitting: Oncology

## 2012-02-15 ENCOUNTER — Other Ambulatory Visit (HOSPITAL_BASED_OUTPATIENT_CLINIC_OR_DEPARTMENT_OTHER): Payer: Medicare Other | Admitting: Lab

## 2012-02-15 ENCOUNTER — Encounter: Payer: Self-pay | Admitting: Oncology

## 2012-02-15 ENCOUNTER — Ambulatory Visit (HOSPITAL_BASED_OUTPATIENT_CLINIC_OR_DEPARTMENT_OTHER): Payer: Medicare Other | Admitting: Oncology

## 2012-02-15 VITALS — BP 124/82 | HR 86 | Temp 97.1°F | Resp 18 | Ht 64.5 in | Wt 223.0 lb

## 2012-02-15 DIAGNOSIS — M25519 Pain in unspecified shoulder: Secondary | ICD-10-CM

## 2012-02-15 DIAGNOSIS — C649 Malignant neoplasm of unspecified kidney, except renal pelvis: Secondary | ICD-10-CM

## 2012-02-15 DIAGNOSIS — N289 Disorder of kidney and ureter, unspecified: Secondary | ICD-10-CM

## 2012-02-15 DIAGNOSIS — N2889 Other specified disorders of kidney and ureter: Secondary | ICD-10-CM

## 2012-02-15 HISTORY — DX: Other specified disorders of kidney and ureter: N28.89

## 2012-02-15 LAB — COMPREHENSIVE METABOLIC PANEL (CC13)
ALT: 19 U/L (ref 0–55)
AST: 20 U/L (ref 5–34)
Albumin: 3.7 g/dL (ref 3.5–5.0)
Alkaline Phosphatase: 102 U/L (ref 40–150)
BUN: 17 mg/dL (ref 7.0–26.0)
CO2: 32 mEq/L — ABNORMAL HIGH (ref 22–29)
Calcium: 9.8 mg/dL (ref 8.4–10.4)
Chloride: 103 mEq/L (ref 98–107)
Creatinine: 0.8 mg/dL (ref 0.6–1.1)
Glucose: 113 mg/dl — ABNORMAL HIGH (ref 70–99)
Potassium: 4.4 mEq/L (ref 3.5–5.1)
Sodium: 141 mEq/L (ref 136–145)
Total Bilirubin: 0.37 mg/dL (ref 0.20–1.20)
Total Protein: 7.5 g/dL (ref 6.4–8.3)

## 2012-02-15 LAB — CBC WITH DIFFERENTIAL/PLATELET
BASO%: 0.4 % (ref 0.0–2.0)
Basophils Absolute: 0 10*3/uL (ref 0.0–0.1)
EOS%: 2.8 % (ref 0.0–7.0)
Eosinophils Absolute: 0.1 10*3/uL (ref 0.0–0.5)
HCT: 42.1 % (ref 34.8–46.6)
HGB: 14.1 g/dL (ref 11.6–15.9)
LYMPH%: 24.4 % (ref 14.0–49.7)
MCH: 27.8 pg (ref 25.1–34.0)
MCHC: 33.5 g/dL (ref 31.5–36.0)
MCV: 83 fL (ref 79.5–101.0)
MONO#: 0.3 10*3/uL (ref 0.1–0.9)
MONO%: 6.4 % (ref 0.0–14.0)
NEUT#: 3.4 10*3/uL (ref 1.5–6.5)
NEUT%: 66 % (ref 38.4–76.8)
Platelets: 265 10*3/uL (ref 145–400)
RBC: 5.08 10*6/uL (ref 3.70–5.45)
RDW: 15.1 % — ABNORMAL HIGH (ref 11.2–14.5)
WBC: 5.1 10*3/uL (ref 3.9–10.3)
lymph#: 1.2 10*3/uL (ref 0.9–3.3)

## 2012-02-15 NOTE — Telephone Encounter (Signed)
appts made and printed for pt aom °

## 2012-02-15 NOTE — Patient Instructions (Addendum)
Shoulder pain: Use Ultram (Tramodol) routinely. May use Ibuprofen 400 mg every 6 hours OR Aleve 1 tab twice a day. If pain gets progressively worse, please call our office.  Results for [name], [name] (MRN [id number]) as of 02/15/2012 10:41  Ref. Range 02/15/2012 10:29  WBC Latest Range: 4.0-10.5 K/uL 5.1  RBC Latest Range: 3.87-5.11 MIL/uL 5.08  Hemoglobin Latest Range: 12.0-15.0 g/dL 40.9  HCT Latest Range: 36.0-46.0 % 42.1  MCV Latest Range: 78.0-100.0 fL 83.0  MCH Latest Range: 26.0-34.0 pg 27.8  MCHC Latest Range: 30.0-36.0 g/dL 81.1  RDW Latest Range: 11.5-15.5 % 15.1 (H)  Platelets Latest Range: 150-400 K/uL 265

## 2012-02-15 NOTE — Progress Notes (Signed)
Hematology and Oncology Follow Up Visit  [name] [id number] Jul 15, 1945 66 y.o. 02/15/2012 2:03 PM  CC: [name] Tenny Craw, M.D.    Principle Diagnosis: This is a 66 year old female with a renal mass, presented with 4.6 x 4.4 x 3.1 cm right renal mass suspicious for a neoplasm.  The patient refused a biopsy or primary treatment. Diagnosed in 2011.   Current therapy: Observation and surveillance  Interim History:  [name] presents today for a followup visit, a pleasant 66 year old whom I saw for the first time back in May 2012 for evaluation for renal mass, and again, that mass is suspicious for a malignancy; however, she has refused workup or treatment.  She has decided to continue with observation, and agreeable to follow up on an intermittent basis with me regarding that. Since the last time I saw her, she has not reported any flank pain.  She has not reported any back pain.  She does report some occasional back discomfort related to her arthritis; but overall thinks her back is better. It is not radiating.  It is not associated with any hematuria.  It is not associated with any neurological deficits.   Performance status and activity level remain reasonable. Reports right shoulder pain that has been ongoing for several months. She is reporting that her depression is better in the last few days. She is under the care of a psychiatrist.   Medications: I have reviewed the patient's current medications. Current outpatient prescriptions:cholecalciferol (VITAMIN D) 1000 UNITS tablet, Take 1,000 Units by mouth daily.  , Disp: , Rfl: ;  DULoxetine (CYMBALTA) 30 MG capsule, Take 30 mg by mouth 2 (two) times daily. , Disp: , Rfl: ;  LamoTRIgine 300 MG TB24, Take 1 tablet by mouth daily., Disp: , Rfl: ;  levothyroxine (SYNTHROID, LEVOTHROID) 75 MCG tablet, Take 75 mcg by mouth daily. , Disp: , Rfl:  LORazepam (ATIVAN) 1 MG tablet, Take 1 mg by mouth 3 (three) times daily. , Disp: , Rfl: ;   magnesium oxide (MAG-OX) 400 MG tablet, Take 400 mg by mouth daily.  , Disp: , Rfl: ;  omeprazole (PRILOSEC) 20 MG capsule, , Disp: , Rfl: ;  PROMETHAZINE HCL PO, Take 1 tablet by mouth as needed., Disp: , Rfl: ;  rOPINIRole (REQUIP) 0.5 MG tablet, TAKE 1 TABLET BY MOUTH LATE IN THE AFTERNOON AND 2 TABLETS BY MOUTH AFTER DINNER, Disp: 90 tablet, Rfl: 4 traMADol (ULTRAM) 50 MG tablet, Take 50 mg by mouth as needed., Disp: , Rfl: ;  traZODone (DESYREL) 50 MG tablet, Take 50 mg by mouth at bedtime as needed., Disp: , Rfl: ;  valsartan-hydrochlorothiazide (DIOVAN-HCT) 320-25 MG per tablet, Take 1 tablet by mouth daily.  , Disp: , Rfl:   Allergies:  Allergies  Allergen Reactions  . Geodon (Ziprasidone Hydrochloride)   . Ketorolac Tromethamine   . Lithium   . Sulfa Antibiotics   . Talwin (Pentazocine)   . Toradol (Ketorolac Tromethamine)     Past Medical History, Surgical history, Social history, and Family History were reviewed and updated.  Review of Systems: Constitutional:  Negative for fever, chills, night sweats, anorexia, weight loss, pain. Cardiovascular: no chest pain or dyspnea on exertion Respiratory: no cough, shortness of breath, or wheezing Neurological: no TIA or stroke symptoms Dermatological: negative ENT: negative Skin: Negative. Gastrointestinal: no abdominal pain, change in bowel habits, or black or bloody stools Genito-Urinary: no dysuria, trouble voiding, or hematuria Hematological and Lymphatic: negative Breast: negative Musculoskeletal: negative Remaining ROS  negative.  Physical Exam: Blood pressure 124/82, pulse 86, temperature 97.1 F (36.2 C), temperature source Oral, resp. rate 18, height 5' 4.5" (1.638 m), weight 223 lb (101.152 kg). ECOG: 1 General appearance: alert Head: Normocephalic, without obvious abnormality, atraumatic Neck: no adenopathy, no carotid bruit, no JVD, supple, symmetrical, trachea midline and thyroid not enlarged, symmetric, no  tenderness/mass/nodules Lymph nodes: Cervical, supraclavicular, and axillary nodes normal. Heart:regular rate and rhythm, S1, S2 normal, no murmur, click, rub or gallop Lung:chest clear, no wheezing, rales, normal symmetric air entry Abdomen: soft, non-tender, without masses or organomegaly EXT:no erythema, induration, or nodules   Lab Results: Lab Results  Component Value Date   WBC 5.1 02/15/2012   HGB 14.1 02/15/2012   HCT 42.1 02/15/2012   MCV 83.0 02/15/2012   PLT 265 02/15/2012     Chemistry      Component Value Date/Time   NA 141 02/15/2012 1029   NA 139 10/19/2011 0949   NA 143 06/02/2011 1008   K 4.4 02/15/2012 1029   K 3.9 10/19/2011 0949   K 3.9 06/02/2011 1008   CL 103 02/15/2012 1029   CL 101 10/19/2011 0949   CL 95* 06/02/2011 1008   CO2 32* 02/15/2012 1029   CO2 29 10/19/2011 0949   CO2 29 06/02/2011 1008   BUN 17.0 02/15/2012 1029   BUN 14 10/19/2011 0949   BUN 14 06/02/2011 1008   CREATININE 0.8 02/15/2012 1029   CREATININE 0.90 10/19/2011 0949   CREATININE 0.8 06/02/2011 1008      Component Value Date/Time   CALCIUM 9.8 02/15/2012 1029   CALCIUM 9.7 10/19/2011 0949   CALCIUM 9.4 06/02/2011 1008   ALKPHOS 102 02/15/2012 1029   ALKPHOS 93 10/19/2011 0949   ALKPHOS 90* 06/02/2011 1008   AST 20 02/15/2012 1029   AST 20 10/19/2011 0949   AST 25 06/02/2011 1008   ALT 19 02/15/2012 1029   ALT 17 10/19/2011 0949   BILITOT 0.37 02/15/2012 1029   BILITOT 0.5 10/19/2011 0949   BILITOT 0.70 06/02/2011 1008      Impression and Plan:   66 year old female with the following issues:  1. A renal mass on the right side very suspicious for malignancy.  I had another  discussion today discussing the ramification of not getting this tumor treated including local pain, hematuria, metastasis and ultimately more pain and possibly death from metastatic cancer.  She has continued to be adamant in refusing any further treatment.  At this point, we will proceed with a more palliative  approach.  If she develops any symptoms, then we will address that as they arise.  At this point, she does not have any symptoms and no reason for any intervention.  2. Shoulder pain. Recommend conservative management with use of her prescription Tramadol or OTC NSAIDS. If pain is progressively worse, she was instructed to call us and we can consider an imaging study at that time. 3. Depression.  This seems to be under reasonable control.     4. Followup will be in 4 months' time.     Clenton Pare 11/26/20132:03 PM

## 2012-02-22 ENCOUNTER — Telehealth: Payer: Self-pay | Admitting: Pulmonary Disease

## 2012-02-22 MED ORDER — ROPINIROLE HCL 0.5 MG PO TABS: ORAL_TABLET | ORAL | Status: DC

## 2012-02-22 NOTE — Telephone Encounter (Signed)
Rx has been sent in, pt aware. 

## 2012-03-08 ENCOUNTER — Other Ambulatory Visit: Payer: Self-pay | Admitting: Obstetrics and Gynecology

## 2012-03-08 ENCOUNTER — Other Ambulatory Visit (HOSPITAL_COMMUNITY)
Admission: RE | Admit: 2012-03-08 | Discharge: 2012-03-08 | Disposition: A | Discharge status: Home / Self Care | Payer: Medicare Other | Source: Ambulatory Visit | Attending: Obstetrics and Gynecology | Admitting: Obstetrics and Gynecology

## 2012-03-08 DIAGNOSIS — Z124 Encounter for screening for malignant neoplasm of cervix: Secondary | ICD-10-CM | POA: Insufficient documentation

## 2012-03-09 ENCOUNTER — Ambulatory Visit (INDEPENDENT_AMBULATORY_CARE_PROVIDER_SITE_OTHER): Payer: Medicare Other | Admitting: General Surgery

## 2012-03-22 HISTORY — PX: OTHER SURGICAL HISTORY: SHX169

## 2012-03-28 ENCOUNTER — Encounter (HOSPITAL_COMMUNITY): Payer: Self-pay | Admitting: Pharmacist

## 2012-03-28 ENCOUNTER — Encounter (INDEPENDENT_AMBULATORY_CARE_PROVIDER_SITE_OTHER): Payer: Self-pay | Admitting: General Surgery

## 2012-03-31 ENCOUNTER — Encounter (HOSPITAL_COMMUNITY): Payer: Self-pay | Admitting: Pharmacy Technician

## 2012-04-02 ENCOUNTER — Other Ambulatory Visit: Payer: Self-pay | Admitting: Obstetrics and Gynecology

## 2012-04-03 ENCOUNTER — Other Ambulatory Visit: Payer: Self-pay

## 2012-04-03 ENCOUNTER — Encounter (HOSPITAL_COMMUNITY)
Admission: RE | Admit: 2012-04-03 | Discharge: 2012-04-03 | Disposition: A | Discharge status: Home / Self Care | Payer: Medicare Other | Source: Ambulatory Visit | Attending: Obstetrics and Gynecology | Admitting: Obstetrics and Gynecology

## 2012-04-03 ENCOUNTER — Encounter (HOSPITAL_COMMUNITY): Payer: Self-pay

## 2012-04-03 HISTORY — DX: Adverse effect of unspecified general anesthetics, initial encounter: T41.205A

## 2012-04-03 HISTORY — DX: Depression, unspecified: F32.A

## 2012-04-03 HISTORY — DX: Hypothyroidism, unspecified: E03.9

## 2012-04-03 HISTORY — DX: Cardiac arrhythmia, unspecified: I49.9

## 2012-04-03 HISTORY — DX: Major depressive disorder, single episode, unspecified: F32.9

## 2012-04-03 HISTORY — DX: Chronic kidney disease, unspecified: N18.9

## 2012-04-03 HISTORY — DX: Other specified postprocedural states: R11.2

## 2012-04-03 HISTORY — DX: Dizziness and giddiness: R42

## 2012-04-03 HISTORY — DX: Other specified postprocedural states: Z98.890

## 2012-04-03 LAB — CBC
HCT: 43.1 % (ref 36.0–46.0)
Hemoglobin: 13.9 g/dL (ref 12.0–15.0)
MCH: 27.5 pg (ref 26.0–34.0)
MCHC: 32.3 g/dL (ref 30.0–36.0)
MCV: 85.2 fL (ref 78.0–100.0)
Platelets: 230 10*3/uL (ref 150–400)
RBC: 5.06 MIL/uL (ref 3.87–5.11)
RDW: 15.1 % (ref 11.5–15.5)
WBC: 6.8 10*3/uL (ref 4.0–10.5)

## 2012-04-03 LAB — BASIC METABOLIC PANEL
BUN: 17 mg/dL (ref 6–23)
CO2: 30 mEq/L (ref 19–32)
Calcium: 10.1 mg/dL (ref 8.4–10.5)
Chloride: 100 mEq/L (ref 96–112)
Creatinine, Ser: 0.9 mg/dL (ref 0.50–1.10)
GFR calc Af Amer: 75 mL/min — ABNORMAL LOW (ref >90)
GFR calc non Af Amer: 65 mL/min — ABNORMAL LOW (ref >90)
Glucose, Bld: 121 mg/dL — ABNORMAL HIGH (ref 70–99)
Potassium: 3.6 mEq/L (ref 3.5–5.1)
Sodium: 141 mEq/L (ref 135–145)

## 2012-04-03 NOTE — Patient Instructions (Addendum)
Your procedure is scheduled on:04/03/12  Enter through the Main Entrance at :0800 am  Pick up desk phone and dial [phone] and inform us of your arrival.  Please call [phone] if you have any problems the morning of surgery.  Remember: Do not eat or drink after midnight:tonight   Take these meds the morning of surgery with a sip of water: Requip, Losartan  DO NOT wear jewelry, eye make-up, lipstick,body lotion, or dark fingernail polish. Do not shave for 48 hours prior to surgery.   Patients discharged on the day of surgery will not be allowed to drive home.

## 2012-04-03 NOTE — Pre-Procedure Instructions (Signed)
Pt requested to speak with anes. during PAT appt- Dr. Sheral Apley saw pt.

## 2012-04-03 NOTE — Anesthesia Preprocedure Evaluation (Addendum)
Anesthesia Evaluation  Patient identified by MRN, date of birth, ID band Patient awake    Reviewed: Allergy & Precautions, H&P , Patient's Chart, lab work & pertinent test results, reviewed documented beta blocker date and time   History of Anesthesia Complications (+) PONV and DIFFICULT AIRWAY  Airway Mallampati: IV TM Distance: <3 FB Neck ROM: full and limited  Mouth opening: Limited Mouth Opening  Dental No notable dental hx.    Pulmonary neg pulmonary ROS, sleep apnea ,  breath sounds clear to auscultation  Pulmonary exam normal       Cardiovascular Exercise Tolerance: Good negative cardio ROS  + dysrhythmias Rhythm:regular Rate:Normal     Neuro/Psych PSYCHIATRIC DISORDERS Anxiety Depression negative neurological ROS  negative psych ROS   GI/Hepatic negative GI ROS, Neg liver ROS, GERD-  ,  Endo/Other  negative endocrine ROSHypothyroidism Morbid obesity  Renal/GU Renal diseasenegative Renal ROS     Musculoskeletal   Abdominal   Peds  Hematology negative hematology ROS (+)   Anesthesia Other Findings PONV (postoperative nausea and vomiting)     Difficult intubation   told by [facility] that she was hard to intubate 15b yrs ago in [place]- surgery since then no problems    Abscess of Bartholin's gland     History of pleural effusion        History of attention deficit disorder     History of endometriosis        RLS (restless legs syndrome)     Anxiety        Obesity     GERD (gastroesophageal reflux disease)        Malignant neoplasm of bladder, part unspecified     Renal mass 02/15/2012      Dysrhythmia   h/o ventricular tachycardia- ablation resolved it Hypothyroidism        Depression   Bipolar disorder OSA (obstructive sleep apnea)   uses oral appliance instead of CPAP    Vertigo     Chronic kidney disease   kidney cancer- pt states she has elected to not have it treated.    Adverse effect of general anesthetic   felt  paralyzed while receiving anesthesia             Reproductive/Obstetrics negative OB ROS                           Anesthesia Physical Anesthesia Plan  ASA: III  Anesthesia Plan: General LMA   Post-op Pain Management:    Induction:   Airway Management Planned:   Additional Equipment:   Intra-op Plan:   Post-operative Plan:   Informed Consent: I have reviewed the patients History and Physical, chart, labs and discussed the procedure including the risks, benefits and alternatives for the proposed anesthesia with the patient or authorized representative who has indicated his/her understanding and acceptance.   Dental Advisory Given  Plan Discussed with: CRNA, Surgeon and Anesthesiologist  Anesthesia Plan Comments:       patient refuses spinal Anesthesia Quick Evaluation

## 2012-04-04 ENCOUNTER — Encounter (HOSPITAL_COMMUNITY): Payer: Self-pay | Admitting: Anesthesiology

## 2012-04-04 ENCOUNTER — Ambulatory Visit (HOSPITAL_COMMUNITY)
Admission: RE | Admit: 2012-04-04 | Discharge: 2012-04-04 | Disposition: A | Discharge status: Home / Self Care | Payer: Medicare Other | Source: Ambulatory Visit | Attending: Obstetrics and Gynecology | Admitting: Obstetrics and Gynecology

## 2012-04-04 ENCOUNTER — Encounter (HOSPITAL_COMMUNITY)
Admission: RE | Disposition: A | Discharge status: Home / Self Care | Payer: Self-pay | Source: Ambulatory Visit | Attending: Obstetrics and Gynecology

## 2012-04-04 ENCOUNTER — Encounter (HOSPITAL_COMMUNITY): Payer: Self-pay | Admitting: *Deleted

## 2012-04-04 ENCOUNTER — Ambulatory Visit (HOSPITAL_COMMUNITY): Payer: Medicare Other | Admitting: Anesthesiology

## 2012-04-04 DIAGNOSIS — N950 Postmenopausal bleeding: Secondary | ICD-10-CM | POA: Diagnosis present

## 2012-04-04 DIAGNOSIS — Z01812 Encounter for preprocedural laboratory examination: Secondary | ICD-10-CM | POA: Insufficient documentation

## 2012-04-04 DIAGNOSIS — Z01818 Encounter for other preprocedural examination: Secondary | ICD-10-CM | POA: Insufficient documentation

## 2012-04-04 DIAGNOSIS — N840 Polyp of corpus uteri: Secondary | ICD-10-CM | POA: Insufficient documentation

## 2012-04-04 SURGERY — DILATATION & CURETTAGE/HYSTEROSCOPY WITH TRUCLEAR
Anesthesia: General | Site: Vagina | Wound class: Clean Contaminated

## 2012-04-04 MED ORDER — PHENYLEPHRINE 40 MCG/ML (10ML) SYRINGE FOR IV PUSH (FOR BLOOD PRESSURE SUPPORT)
PREFILLED_SYRINGE | INTRAVENOUS | Status: AC
  Filled 2012-04-04: qty 5

## 2012-04-04 MED ORDER — ONDANSETRON HCL 4 MG/2ML IJ SOLN
4.0000 mg | Freq: Once | INTRAMUSCULAR | Status: AC
  Administered 2012-04-04: 4 mg via INTRAVENOUS

## 2012-04-04 MED ORDER — DEXAMETHASONE SODIUM PHOSPHATE 4 MG/ML IJ SOLN
INTRAMUSCULAR | Status: DC | PRN
  Administered 2012-04-04: 10 mg via INTRAVENOUS

## 2012-04-04 MED ORDER — MIDAZOLAM HCL 2 MG/2ML IJ SOLN
INTRAMUSCULAR | Status: AC
  Filled 2012-04-04: qty 2

## 2012-04-04 MED ORDER — LACTATED RINGERS IV SOLN
INTRAVENOUS | Status: DC | PRN
  Administered 2012-04-04: 09:00:00 via INTRAVENOUS

## 2012-04-04 MED ORDER — DEXAMETHASONE SODIUM PHOSPHATE 10 MG/ML IJ SOLN
INTRAMUSCULAR | Status: AC
  Filled 2012-04-04: qty 1

## 2012-04-04 MED ORDER — ONDANSETRON HCL 4 MG/2ML IJ SOLN
INTRAMUSCULAR | Status: AC
  Filled 2012-04-04: qty 2

## 2012-04-04 MED ORDER — LIDOCAINE HCL (CARDIAC) 20 MG/ML IV SOLN
INTRAVENOUS | Status: DC | PRN
  Administered 2012-04-04: 40 mg via INTRAVENOUS
  Administered 2012-04-04: 20 mg via INTRAVENOUS

## 2012-04-04 MED ORDER — SODIUM CHLORIDE 0.9 % IR SOLN
Status: DC | PRN
  Administered 2012-04-04: 3000 mL

## 2012-04-04 MED ORDER — BUPIVACAINE HCL (PF) 0.25 % IJ SOLN
INTRAMUSCULAR | Status: DC | PRN
  Administered 2012-04-04: 20 mL

## 2012-04-04 MED ORDER — ACETAMINOPHEN 10 MG/ML IV SOLN
1000.0000 mg | Freq: Once | INTRAVENOUS | Status: DC | PRN
  Filled 2012-04-04: qty 100

## 2012-04-04 MED ORDER — FENTANYL CITRATE 0.05 MG/ML IJ SOLN
INTRAMUSCULAR | Status: AC
  Filled 2012-04-04: qty 2

## 2012-04-04 MED ORDER — LACTATED RINGERS IV SOLN
INTRAVENOUS | Status: DC
  Administered 2012-04-04: 09:00:00 via INTRAVENOUS

## 2012-04-04 MED ORDER — EPHEDRINE SULFATE 50 MG/ML IJ SOLN: INTRAMUSCULAR | Status: DC | PRN

## 2012-04-04 MED ORDER — PROPOFOL 10 MG/ML IV EMUL
INTRAVENOUS | Status: AC
  Filled 2012-04-04: qty 20

## 2012-04-04 MED ORDER — MIDAZOLAM HCL 5 MG/5ML IJ SOLN
INTRAMUSCULAR | Status: DC | PRN
  Administered 2012-04-04: 1 mg via INTRAVENOUS

## 2012-04-04 MED ORDER — LIDOCAINE HCL (CARDIAC) 20 MG/ML IV SOLN
INTRAVENOUS | Status: AC
  Filled 2012-04-04: qty 5

## 2012-04-04 MED ORDER — FENTANYL CITRATE 0.05 MG/ML IJ SOLN: 25.0000 ug | INTRAMUSCULAR | Status: DC | PRN

## 2012-04-04 MED ORDER — EPHEDRINE 5 MG/ML INJ
INTRAVENOUS | Status: AC
  Filled 2012-04-04: qty 10

## 2012-04-04 MED ORDER — SILVER NITRATE-POT NITRATE 75-25 % EX MISC
CUTANEOUS | Status: DC | PRN
  Administered 2012-04-04: 2

## 2012-04-04 MED ORDER — BUPIVACAINE HCL (PF) 0.25 % IJ SOLN
INTRAMUSCULAR | Status: AC
  Filled 2012-04-04: qty 30

## 2012-04-04 MED ORDER — DOXYCYCLINE HYCLATE 100 MG PO TABS: 100.0000 mg | ORAL_TABLET | Freq: Every day | ORAL | Status: DC

## 2012-04-04 MED ORDER — FENTANYL CITRATE 0.05 MG/ML IJ SOLN
INTRAMUSCULAR | Status: DC | PRN
  Administered 2012-04-04 (×2): 50 ug via INTRAVENOUS

## 2012-04-04 MED ORDER — ONDANSETRON HCL 4 MG/2ML IJ SOLN
INTRAMUSCULAR | Status: DC | PRN
  Administered 2012-04-04: 4 mg via INTRAVENOUS

## 2012-04-04 MED ORDER — EPHEDRINE SULFATE 50 MG/ML IJ SOLN
INTRAMUSCULAR | Status: DC | PRN
  Administered 2012-04-04: 15 mg via INTRAVENOUS
  Administered 2012-04-04 (×2): 5 mg via INTRAVENOUS
  Administered 2012-04-04: 20 mg via INTRAVENOUS
  Administered 2012-04-04: 5 mg via INTRAVENOUS

## 2012-04-04 MED ORDER — PHENYLEPHRINE HCL 10 MG/ML IJ SOLN
INTRAMUSCULAR | Status: DC | PRN
  Administered 2012-04-04: 120 ug via INTRAVENOUS
  Administered 2012-04-04: 80 ug via INTRAVENOUS
  Administered 2012-04-04: 120 ug via INTRAVENOUS
  Administered 2012-04-04: 80 ug via INTRAVENOUS

## 2012-04-04 MED ORDER — OXYCODONE-ACETAMINOPHEN 5-325 MG PO TABS: 1.0000 | ORAL_TABLET | Freq: Four times a day (QID) | ORAL | Status: DC | PRN

## 2012-04-04 MED ORDER — PROPOFOL 10 MG/ML IV EMUL
INTRAVENOUS | Status: DC | PRN
  Administered 2012-04-04: 180 mg via INTRAVENOUS

## 2012-04-04 SURGICAL SUPPLY — 24 items
BLADE INCISOR TRUC PLUS 2.9 (ABLATOR) IMPLANT
CANISTERS HI-FLOW 3000CC (CANNISTER) ×2 IMPLANT
CATH ROBINSON RED A/P 16FR (CATHETERS) ×2 IMPLANT
CLOTH BEACON ORANGE TIMEOUT ST (SAFETY) ×2 IMPLANT
CONTAINER PREFILL 10% NBF 60ML (FORM) ×4 IMPLANT
DRAPE HYSTEROSCOPY (DRAPE) ×2 IMPLANT
DRESSING TELFA 8X3 (GAUZE/BANDAGES/DRESSINGS) ×2 IMPLANT
ELECT REM PT RETURN 9FT ADLT (ELECTROSURGICAL) ×2
ELECTRODE REM PT RTRN 9FT ADLT (ELECTROSURGICAL) ×1 IMPLANT
GLOVE BIOGEL M 6.5 STRL (GLOVE) ×4 IMPLANT
GLOVE BIOGEL PI IND STRL 6.5 (GLOVE) ×2 IMPLANT
GLOVE BIOGEL PI INDICATOR 6.5 (GLOVE) ×2
GOWN PREVENTION PLUS XLARGE (GOWN DISPOSABLE) ×2 IMPLANT
GOWN STRL REIN XL XLG (GOWN DISPOSABLE) ×4 IMPLANT
INCISOR TRUC PLUS BLADE 2.9 (ABLATOR) ×2
KIT HYSTEROSCOPY TRUCLEAR (ABLATOR) ×2 IMPLANT
MORCELLATOR RECIP TRUCLEAR 4.0 (ABLATOR) IMPLANT
NDL SPNL 22GX3.5 QUINCKE BK (NEEDLE) ×1 IMPLANT
NEEDLE SPNL 22GX3.5 QUINCKE BK (NEEDLE) ×2 IMPLANT
PACK VAGINAL MINOR WOMEN LF (CUSTOM PROCEDURE TRAY) ×2 IMPLANT
PAD OB MATERNITY 4.3X12.25 (PERSONAL CARE ITEMS) ×2 IMPLANT
SYR CONTROL 10ML LL (SYRINGE) ×2 IMPLANT
TOWEL OR 17X24 6PK STRL BLUE (TOWEL DISPOSABLE) ×4 IMPLANT
WATER STERILE IRR 1000ML POUR (IV SOLUTION) ×2 IMPLANT

## 2012-04-04 NOTE — H&P (Signed)
Past Surgical History  Procedure Date  . Thyroidectomy 1990  . Tubal ligation 1970s  . Nasoseptal reconstruction 1990s  . Cardiac electrophysiology mapping and ablation 2000s  . Tonsillectomy     as a child   Date of Initial H&P: 02/2012  History reviewed, patient examined, no change in status, stable for surgery.

## 2012-04-04 NOTE — Anesthesia Postprocedure Evaluation (Signed)
Anesthesia Post Note  Patient: [name]  Procedure(s) Performed: Procedure(s) (LRB): DILATATION & CURETTAGE/HYSTEROSCOPY WITH TRUCLEAR (N/A)  Anesthesia type: General  Patient location: PACU  Post pain: Pain level controlled  Post assessment: Post-op Vital signs reviewed  Last Vitals:  Filed Vitals:   04/04/12 1300  BP: 136/77  Pulse: 92  Temp: 37.1 C  Resp: 13    Post vital signs: Reviewed  Level of consciousness: sedated  Complications: No apparent anesthesia complications

## 2012-04-04 NOTE — Anesthesia Postprocedure Evaluation (Signed)
  Anesthesia Post-op Note  Patient: [name]  Procedure(s) Performed: Procedure(s) (LRB) with comments: DILATATION & CURETTAGE/HYSTEROSCOPY WITH TRUCLEAR (N/A)  Patient Location: PACU  Anesthesia Type:General  Level of Consciousness: awake, oriented and patient cooperative  Airway and Oxygen Therapy: Patient Spontanous Breathing and Patient connected to nasal cannula oxygen  Post-op Pain: none  Post-op Assessment: Post-op Vital signs reviewed and Patient's Cardiovascular Status Stable  Post-op Vital Signs: Reviewed and stable  Complications: No apparent anesthesia complications

## 2012-04-04 NOTE — Op Note (Signed)
04/04/2012  11:44 AM  PATIENT:  [name]  67 y.o. female  PRE-OPERATIVE DIAGNOSIS:  PMB  POST-OPERATIVE DIAGNOSIS:  PMB  PROCEDURE:  Procedure(s) (LRB) with comments: DILATATION & CURETTAGE/HYSTEROSCOPY WITH TRUCLEAR (N/A)  SURGEON:  Surgeon(s) and Role:    * Gordon Vandunk J. Richardson Dopp, MD - Primary  PHYSICIAN ASSISTANT:   ASSISTANTS: none   ANESTHESIA:   general  EBL:  Total I/O In: 700 [I.V.:700] Out: 50 [Urine:50]  BLOOD ADMINISTERED:none  DRAINS: none   LOCAL MEDICATIONS USED:  MARCAINE     SPECIMEN:  Source of Specimen:  endometrial currettings and endometrial polyps   DISPOSITION OF SPECIMEN:  PATHOLOGY  COUNTS:  YES  TOURNIQUET:  * No tourniquets in log *  DICTATION: .Dragon Dictation  PLAN OF CARE: Discharge to home after PACU  PATIENT DISPOSITION:  PACU - hemodynamically stable.   Delay start of Pharmacological VTE agent (>24 hrs) due to surgical blood loss or risk of bleeding: not applicable   Findings. Small endometrial polyps normal external genitalia      Procedure: Patient was taken to the operating room where she was placed under general anesthesia. She was placed in the dorsal lithotomy position. She was prepped and draped in the usual sterile fashion. A speculum was placed into the vaginal vault. The anterior lip of the cervix was grasped with a single-tooth tenaculum. Quarter percent Marcaine was injected at the 4 and 8:00 positions of the cervix. The cervix was then sounded to 7 cm. The cervix was dilated to approximately 6 mm. Truclear operative  hysteroscope was inserted. The findings noted above. Truclear small polyp blade was introduced through the Truclear hysteroscopy. The endometrial polyp was removed in 1 minute.  There was no evidence of perforation. Hysteroscope was then removed. Sharp curettage was performed. hysteroscope was reinserted there was no evidence of perforation.  The single-tooth tenaculum was removed from the anterior lip of  the cervix. Patient was noted to have bleeding from the tenaculum site. Silver nitrate was applied and excellent hemostasis was noted. The speculum was removed from the patient's vagina. She was awakened from anesthesia taken care  To the recovery  room awake and in stable condition. Sponge lap and needle counts were correct x2.

## 2012-04-04 NOTE — Transfer of Care (Signed)
Immediate Anesthesia Transfer of Care Note  Patient: [name]  Procedure(s) Performed: Procedure(s) (LRB) with comments: DILATATION & CURETTAGE/HYSTEROSCOPY WITH TRUCLEAR (N/A)  Patient Location: PACU  Anesthesia Type:General  Level of Consciousness: awake, oriented and patient cooperative  Airway & Oxygen Therapy: Patient Spontanous Breathing and Patient connected to nasal cannula oxygen  Post-op Assessment: Report given to PACU RN and Post -op Vital signs reviewed and stable  Post vital signs: Reviewed and stable  Complications: No apparent anesthesia complications

## 2012-06-08 ENCOUNTER — Emergency Department (HOSPITAL_COMMUNITY)
Admission: EM | Admit: 2012-06-08 | Discharge: 2012-06-09 | Disposition: A | Discharge status: Home / Self Care | Payer: Medicare Other | Attending: Emergency Medicine | Admitting: Emergency Medicine

## 2012-06-08 DIAGNOSIS — G2581 Restless legs syndrome: Secondary | ICD-10-CM | POA: Insufficient documentation

## 2012-06-08 DIAGNOSIS — Z87448 Personal history of other diseases of urinary system: Secondary | ICD-10-CM | POA: Insufficient documentation

## 2012-06-08 DIAGNOSIS — R002 Palpitations: Secondary | ICD-10-CM

## 2012-06-08 DIAGNOSIS — Z8659 Personal history of other mental and behavioral disorders: Secondary | ICD-10-CM | POA: Insufficient documentation

## 2012-06-08 DIAGNOSIS — Z8742 Personal history of other diseases of the female genital tract: Secondary | ICD-10-CM | POA: Insufficient documentation

## 2012-06-08 DIAGNOSIS — E669 Obesity, unspecified: Secondary | ICD-10-CM | POA: Insufficient documentation

## 2012-06-08 DIAGNOSIS — R000 Tachycardia, unspecified: Secondary | ICD-10-CM | POA: Insufficient documentation

## 2012-06-08 DIAGNOSIS — E039 Hypothyroidism, unspecified: Secondary | ICD-10-CM | POA: Insufficient documentation

## 2012-06-08 DIAGNOSIS — E871 Hypo-osmolality and hyponatremia: Secondary | ICD-10-CM | POA: Insufficient documentation

## 2012-06-08 DIAGNOSIS — Z8669 Personal history of other diseases of the nervous system and sense organs: Secondary | ICD-10-CM | POA: Insufficient documentation

## 2012-06-08 DIAGNOSIS — Z8679 Personal history of other diseases of the circulatory system: Secondary | ICD-10-CM | POA: Insufficient documentation

## 2012-06-08 DIAGNOSIS — G4733 Obstructive sleep apnea (adult) (pediatric): Secondary | ICD-10-CM | POA: Insufficient documentation

## 2012-06-08 DIAGNOSIS — Z79899 Other long term (current) drug therapy: Secondary | ICD-10-CM | POA: Insufficient documentation

## 2012-06-08 DIAGNOSIS — Z8719 Personal history of other diseases of the digestive system: Secondary | ICD-10-CM | POA: Insufficient documentation

## 2012-06-08 DIAGNOSIS — F411 Generalized anxiety disorder: Secondary | ICD-10-CM | POA: Insufficient documentation

## 2012-06-08 DIAGNOSIS — F3289 Other specified depressive episodes: Secondary | ICD-10-CM | POA: Insufficient documentation

## 2012-06-08 DIAGNOSIS — F329 Major depressive disorder, single episode, unspecified: Secondary | ICD-10-CM | POA: Insufficient documentation

## 2012-06-08 DIAGNOSIS — M542 Cervicalgia: Secondary | ICD-10-CM | POA: Insufficient documentation

## 2012-06-08 DIAGNOSIS — N189 Chronic kidney disease, unspecified: Secondary | ICD-10-CM | POA: Insufficient documentation

## 2012-06-08 DIAGNOSIS — Z8551 Personal history of malignant neoplasm of bladder: Secondary | ICD-10-CM | POA: Insufficient documentation

## 2012-06-08 DIAGNOSIS — Z8709 Personal history of other diseases of the respiratory system: Secondary | ICD-10-CM | POA: Insufficient documentation

## 2012-06-09 ENCOUNTER — Emergency Department (HOSPITAL_COMMUNITY): Payer: Medicare Other

## 2012-06-09 ENCOUNTER — Encounter (HOSPITAL_COMMUNITY): Payer: Self-pay | Admitting: Family Medicine

## 2012-06-09 LAB — CBC WITH DIFFERENTIAL/PLATELET
Basophils Absolute: 0 10*3/uL (ref 0.0–0.1)
Basophils Relative: 0 % (ref 0–1)
Eosinophils Absolute: 0.1 10*3/uL (ref 0.0–0.7)
Eosinophils Relative: 2 % (ref 0–5)
HCT: 38.5 % (ref 36.0–46.0)
Hemoglobin: 13.2 g/dL (ref 12.0–15.0)
Lymphocytes Relative: 20 % (ref 12–46)
Lymphs Abs: 1.4 10*3/uL (ref 0.7–4.0)
MCH: 27.5 pg (ref 26.0–34.0)
MCHC: 34.3 g/dL (ref 30.0–36.0)
MCV: 80.2 fL (ref 78.0–100.0)
Monocytes Absolute: 0.5 10*3/uL (ref 0.1–1.0)
Monocytes Relative: 7 % (ref 3–12)
Neutro Abs: 4.8 10*3/uL (ref 1.7–7.7)
Neutrophils Relative %: 71 % (ref 43–77)
Platelets: 242 10*3/uL (ref 150–400)
RBC: 4.8 MIL/uL (ref 3.87–5.11)
RDW: 14.8 % (ref 11.5–15.5)
WBC: 6.8 10*3/uL (ref 4.0–10.5)

## 2012-06-09 LAB — BASIC METABOLIC PANEL
BUN: 19 mg/dL (ref 6–23)
CO2: 26 mEq/L (ref 19–32)
Calcium: 9.8 mg/dL (ref 8.4–10.5)
Chloride: 101 mEq/L (ref 96–112)
Creatinine, Ser: 0.79 mg/dL (ref 0.50–1.10)
GFR calc Af Amer: >90 mL/min (ref >90)
GFR calc non Af Amer: 84 mL/min — ABNORMAL LOW (ref >90)
Glucose, Bld: 129 mg/dL — ABNORMAL HIGH (ref 70–99)
Potassium: 3.4 mEq/L — ABNORMAL LOW (ref 3.5–5.1)
Sodium: 139 mEq/L (ref 135–145)

## 2012-06-09 MED ORDER — SODIUM CHLORIDE 0.9 % IV BOLUS (SEPSIS)
500.0000 mL | Freq: Once | INTRAVENOUS | Status: AC
  Administered 2012-06-09: 500 mL via INTRAVENOUS

## 2012-06-09 MED ORDER — POTASSIUM CHLORIDE CRYS ER 20 MEQ PO TBCR
40.0000 meq | EXTENDED_RELEASE_TABLET | Freq: Once | ORAL | Status: AC
  Administered 2012-06-09: 40 meq via ORAL
  Filled 2012-06-09: qty 2

## 2012-06-09 NOTE — ED Notes (Signed)
Pt returned from CT °

## 2012-06-09 NOTE — ED Notes (Signed)
Per EMS pt began experiencing chest pressure at 2230 tonight. Pt reports chest pressure, palpitations, and right sided neck pain, in addition to nausea. EMS gave 324 ASA and 2SL nitro with no relief to pressure.  Pt states has hx of anxiety. Dr. Norlene Campbell at bedside for assessment.

## 2012-06-09 NOTE — ED Provider Notes (Signed)
History     CSN: [id number]  Arrival date & time 06/08/12  2355   First MD Initiated Contact with Patient 06/08/12 2357      Chief Complaint  Patient presents with  . Chest Pain  . Neck Pain    (Consider location/radiation/quality/duration/timing/severity/associated sxs/prior treatment) HPI 67 yo female presents to the ER via EMS with complaint of palpitations and intermittent right neck pulsations/pain over the last few days.  Pt reports palpitations started tonight around 1030 when she leaned over to get laundry.  Pt reports she has had similar sxs before usually only lasting a few minutes.  Past history of PSVT requiring ablation, no issues since the ablation several years ago.  Pt was seen by her pcm earlier today and dx with sinus issues, started on abx.  No cold medications, no caffeine.  Mild sob with palpitations earlier, now resolved.  No chest pain.  Neck pain is a brief pulsation pain lasting seconds over the last week.  None now.  Past Medical History  Diagnosis Date  . Abscess of Bartholin's gland   . History of pleural effusion   . History of attention deficit disorder   . History of endometriosis   . RLS (restless legs syndrome)   . Anxiety   . Obesity   . GERD (gastroesophageal reflux disease)   . Malignant neoplasm of bladder, part unspecified   . Renal mass 02/15/2012  . Dysrhythmia     h/o ventricular tachycardia- ablation resolved it  . Hypothyroidism   . Depression     Bipolar disorder  . OSA (obstructive sleep apnea)     uses oral appliance instead of CPAP  . Vertigo   . Chronic kidney disease     kidney cancer- pt states she has elected to not have it treated.  Marland Kitchen PONV (postoperative nausea and vomiting)   . Difficult intubation     told by [facility] that she was hard to intubate 15b yrs ago in [place]- surgery since then no problems  . Adverse effect of general anesthetic     felt paralyzed while receiving anesthesia    Past Surgical History  Procedure  Laterality Date  . Thyroidectomy  1990  . Tubal ligation  1970s  . Nasoseptal reconstruction  1990s  . Cardiac electrophysiology mapping and ablation  2000s  . Tonsillectomy      as a child  . Uterine mass removal  03/2012    was found to be benign    Family History  Problem Relation Age of Onset  . Allergies Father   . Allergies Sister     multiple  . Allergies Brother     multiple  . Allergies Daughter   . Heart disease Mother   . Heart disease Maternal Grandfather   . Colon cancer Paternal Grandmother   . Cancer Paternal Grandfather   . Skin cancer Father     History  Substance Use Topics  . Smoking status: Never Smoker   . Smokeless tobacco: Not on file  . Alcohol Use: No    OB History   Grav Para Term Preterm Abortions TAB SAB Ect Mult Living                  Review of Systems  All other systems reviewed and are negative.    Allergies  Geodon; Lithium; Talwin; and Toradol  Home Medications   Current Outpatient Rx  Name  Route  Sig  Dispense  Refill  . Cholecalciferol (VITAMIN D)  2000 UNITS CAPS   Oral   Take 1 capsule by mouth daily.         . DULoxetine (CYMBALTA) 30 MG capsule   Oral   Take 60 mg by mouth daily.          Marland Kitchen lamoTRIgine (LAMICTAL) 200 MG tablet   Oral   Take 300 mg by mouth daily.         Marland Kitchen levothyroxine (SYNTHROID, LEVOTHROID) 75 MCG tablet   Oral   Take 75 mcg by mouth daily.          Marland Kitchen LORazepam (ATIVAN) 1 MG tablet   Oral   Take 1 mg by mouth every 8 (eight) hours as needed. For anxiety         . magnesium gluconate (MAGONATE) 500 MG tablet   Oral   Take 500 mg by mouth daily.         Marland Kitchen oxyCODONE-acetaminophen (PERCOCET/ROXICET) 5-325 MG per tablet   Oral   Take 1 tablet by mouth every 6 (six) hours as needed for pain.         . promethazine (PHENERGAN) 25 MG tablet   Oral   Take 25 mg by mouth daily as needed. For nausea         . rOPINIRole (REQUIP) 0.5 MG tablet   Oral   Take 1 mg by mouth  daily as needed. Takes during the day if needed for restless leg syndrome         . traMADol (ULTRAM) 50 MG tablet   Oral   Take 50-100 mg by mouth daily as needed. For headaches/migraines         . valsartan-hydrochlorothiazide (DIOVAN-HCT) 320-25 MG per tablet   Oral   Take 1 tablet by mouth daily.             BP 127/71  Pulse 82  Temp(Src) 98.6 F (37 C) (Oral)  Resp 21  SpO2 95%  Physical Exam  Nursing note and vitals reviewed. Constitutional: She is oriented to person, place, and time. She appears well-developed and well-nourished. She appears distressed (anxious appearing).  HENT:  Head: Normocephalic and atraumatic.  Right Ear: External ear normal.  Left Ear: External ear normal.  Nose: Nose normal.  Mouth/Throat: Oropharynx is clear and moist.  Eyes: Conjunctivae and EOM are normal. Pupils are equal, round, and reactive to light.  Neck: Normal range of motion. Neck supple. No JVD present. No tracheal deviation present. No thyromegaly present.  Cardiovascular: Normal rate, regular rhythm, normal heart sounds and intact distal pulses.  Exam reveals no gallop and no friction rub.   No murmur heard. Pulmonary/Chest: Effort normal and breath sounds normal. No stridor. No respiratory distress. She has no wheezes. She has no rales. She exhibits no tenderness.  Abdominal: Soft. Bowel sounds are normal. She exhibits no distension and no mass. There is no tenderness. There is no rebound and no guarding.  Musculoskeletal: Normal range of motion. She exhibits no edema and no tenderness.  Lymphadenopathy:    She has no cervical adenopathy.  Neurological: She is alert and oriented to person, place, and time. She has normal reflexes. No cranial nerve deficit. She exhibits normal muscle tone. Coordination normal.  Skin: Skin is warm and dry. No rash noted. No erythema. No pallor.  Psychiatric: She has a normal mood and affect. Her behavior is normal. Judgment and thought content  normal.    ED Course  Procedures (including critical care time)  Labs Reviewed  BASIC METABOLIC PANEL - Abnormal; Notable for the following:    Potassium 3.4 (*)    Glucose, Bld 129 (*)    GFR calc non Af Amer 84 (*)    All other components within normal limits  CBC WITH DIFFERENTIAL   Dg Chest 2 View  06/09/2012  *RADIOLOGY REPORT*  Clinical Data: Chest pain.  Shortness of breath.  History of hypothyroidism, chronic kidney disease, and sleep apnea.  CHEST - 2 VIEW  Comparison: Two-view chest x-ray 09/20/2009, 08/14/2006.  Findings: Suboptimal inspiration due to body habitus which accounts for atelectasis in the lower lobes.  Lungs otherwise clear. Bronchovascular markings normal.  Pulmonary vascularity normal.  No pneumothorax.  No pleural effusions. Cardiomediastinal silhouette unremarkable and unchanged.  Mild degenerative changes involving the thoracic spine.  No significant interval change.  IMPRESSION: Suboptimal inspiration accounts for atelectasis in the lower lobes. No acute cardiopulmonary disease otherwise.   Original Report Authenticated By: Hulan Saas, M.D.     Date: 06/09/2012  Rate: 100  Rhythm: sinus tachycardia  QRS Axis: normal  Intervals: normal  ST/T Wave abnormalities: normal  Conduction Disutrbances:none  Narrative Interpretation: slight rate increase from prior  Old EKG Reviewed: changes noted     1. Palpitations       MDM  67 yo female with mild tachycardia. No PSVT. Mild hyponatremia.  Pt much improved during stay in ED.  Possible panic attack?  Positional tachycardia?  Will have her f/u with pcm.        Olivia Mackie, MD 06/09/12 [phone]

## 2012-06-09 NOTE — ED Notes (Signed)
Rx x 0.  Pt voiced understanding to f/u with PCP and return for worsening condition.  

## 2012-06-15 ENCOUNTER — Ambulatory Visit: Payer: Medicare Other | Admitting: Oncology

## 2012-06-15 ENCOUNTER — Other Ambulatory Visit: Payer: Medicare Other | Admitting: Lab

## 2012-06-15 ENCOUNTER — Telehealth: Payer: Self-pay | Admitting: Oncology

## 2012-06-29 ENCOUNTER — Inpatient Hospital Stay (HOSPITAL_COMMUNITY)
Admission: AD | Admit: 2012-06-29 | Discharge: 2012-07-05 | DRG: 885 | Disposition: A | Discharge status: Home / Self Care | Payer: Medicare Other | Source: Intra-hospital | Attending: Psychiatry | Admitting: Psychiatry

## 2012-06-29 ENCOUNTER — Emergency Department (HOSPITAL_COMMUNITY)
Admission: EM | Admit: 2012-06-29 | Discharge: 2012-06-29 | Disposition: A | Discharge status: Other Acute Inpatient Hospital | Payer: Medicare Other | Attending: Emergency Medicine | Admitting: Emergency Medicine

## 2012-06-29 ENCOUNTER — Encounter (HOSPITAL_COMMUNITY): Payer: Self-pay

## 2012-06-29 DIAGNOSIS — F39 Unspecified mood [affective] disorder: Secondary | ICD-10-CM | POA: Insufficient documentation

## 2012-06-29 DIAGNOSIS — F4389 Other reactions to severe stress: Secondary | ICD-10-CM | POA: Insufficient documentation

## 2012-06-29 DIAGNOSIS — G2581 Restless legs syndrome: Secondary | ICD-10-CM

## 2012-06-29 DIAGNOSIS — R5383 Other fatigue: Secondary | ICD-10-CM | POA: Insufficient documentation

## 2012-06-29 DIAGNOSIS — F339 Major depressive disorder, recurrent, unspecified: Secondary | ICD-10-CM

## 2012-06-29 DIAGNOSIS — Z8669 Personal history of other diseases of the nervous system and sense organs: Secondary | ICD-10-CM | POA: Insufficient documentation

## 2012-06-29 DIAGNOSIS — F29 Unspecified psychosis not due to a substance or known physiological condition: Secondary | ICD-10-CM | POA: Insufficient documentation

## 2012-06-29 DIAGNOSIS — N2889 Other specified disorders of kidney and ureter: Secondary | ICD-10-CM

## 2012-06-29 DIAGNOSIS — F329 Major depressive disorder, single episode, unspecified: Secondary | ICD-10-CM

## 2012-06-29 DIAGNOSIS — G4733 Obstructive sleep apnea (adult) (pediatric): Secondary | ICD-10-CM

## 2012-06-29 DIAGNOSIS — R630 Anorexia: Secondary | ICD-10-CM | POA: Insufficient documentation

## 2012-06-29 DIAGNOSIS — F4310 Post-traumatic stress disorder, unspecified: Secondary | ICD-10-CM | POA: Diagnosis present

## 2012-06-29 DIAGNOSIS — F3130 Bipolar disorder, current episode depressed, mild or moderate severity, unspecified: Secondary | ICD-10-CM | POA: Diagnosis not present

## 2012-06-29 DIAGNOSIS — F411 Generalized anxiety disorder: Secondary | ICD-10-CM | POA: Diagnosis present

## 2012-06-29 DIAGNOSIS — Z8742 Personal history of other diseases of the female genital tract: Secondary | ICD-10-CM | POA: Insufficient documentation

## 2012-06-29 DIAGNOSIS — F99 Mental disorder, not otherwise specified: Secondary | ICD-10-CM

## 2012-06-29 DIAGNOSIS — N189 Chronic kidney disease, unspecified: Secondary | ICD-10-CM | POA: Diagnosis present

## 2012-06-29 DIAGNOSIS — F332 Major depressive disorder, recurrent severe without psychotic features: Principal | ICD-10-CM | POA: Diagnosis present

## 2012-06-29 DIAGNOSIS — Z79899 Other long term (current) drug therapy: Secondary | ICD-10-CM | POA: Insufficient documentation

## 2012-06-29 DIAGNOSIS — G479 Sleep disorder, unspecified: Secondary | ICD-10-CM | POA: Insufficient documentation

## 2012-06-29 DIAGNOSIS — F319 Bipolar disorder, unspecified: Secondary | ICD-10-CM | POA: Insufficient documentation

## 2012-06-29 DIAGNOSIS — Z8551 Personal history of malignant neoplasm of bladder: Secondary | ICD-10-CM | POA: Insufficient documentation

## 2012-06-29 DIAGNOSIS — F988 Other specified behavioral and emotional disorders with onset usually occurring in childhood and adolescence: Secondary | ICD-10-CM

## 2012-06-29 DIAGNOSIS — Z87448 Personal history of other diseases of urinary system: Secondary | ICD-10-CM | POA: Insufficient documentation

## 2012-06-29 DIAGNOSIS — F438 Other reactions to severe stress: Secondary | ICD-10-CM | POA: Insufficient documentation

## 2012-06-29 DIAGNOSIS — Z8709 Personal history of other diseases of the respiratory system: Secondary | ICD-10-CM | POA: Insufficient documentation

## 2012-06-29 DIAGNOSIS — F3289 Other specified depressive episodes: Secondary | ICD-10-CM

## 2012-06-29 DIAGNOSIS — K8020 Calculus of gallbladder without cholecystitis without obstruction: Secondary | ICD-10-CM

## 2012-06-29 DIAGNOSIS — IMO0002 Reserved for concepts with insufficient information to code with codable children: Secondary | ICD-10-CM | POA: Insufficient documentation

## 2012-06-29 DIAGNOSIS — E039 Hypothyroidism, unspecified: Secondary | ICD-10-CM | POA: Insufficient documentation

## 2012-06-29 DIAGNOSIS — Z8719 Personal history of other diseases of the digestive system: Secondary | ICD-10-CM | POA: Insufficient documentation

## 2012-06-29 DIAGNOSIS — E669 Obesity, unspecified: Secondary | ICD-10-CM | POA: Insufficient documentation

## 2012-06-29 DIAGNOSIS — Z8659 Personal history of other mental and behavioral disorders: Secondary | ICD-10-CM | POA: Insufficient documentation

## 2012-06-29 DIAGNOSIS — Z8679 Personal history of other diseases of the circulatory system: Secondary | ICD-10-CM | POA: Insufficient documentation

## 2012-06-29 DIAGNOSIS — R5381 Other malaise: Secondary | ICD-10-CM | POA: Insufficient documentation

## 2012-06-29 HISTORY — DX: Post-traumatic stress disorder, unspecified: F43.10

## 2012-06-29 HISTORY — DX: Bipolar disorder, unspecified: F31.9

## 2012-06-29 LAB — CBC WITH DIFFERENTIAL/PLATELET
Basophils Absolute: 0 10*3/uL (ref 0.0–0.1)
Basophils Relative: 0 % (ref 0–1)
Eosinophils Absolute: 0.2 10*3/uL (ref 0.0–0.7)
Eosinophils Relative: 2 % (ref 0–5)
HCT: 43.3 % (ref 36.0–46.0)
Hemoglobin: 14.4 g/dL (ref 12.0–15.0)
Lymphocytes Relative: 22 % (ref 12–46)
Lymphs Abs: 1.4 10*3/uL (ref 0.7–4.0)
MCH: 27.6 pg (ref 26.0–34.0)
MCHC: 33.3 g/dL (ref 30.0–36.0)
MCV: 83.1 fL (ref 78.0–100.0)
Monocytes Absolute: 0.4 10*3/uL (ref 0.1–1.0)
Monocytes Relative: 7 % (ref 3–12)
Neutro Abs: 4.4 10*3/uL (ref 1.7–7.7)
Neutrophils Relative %: 69 % (ref 43–77)
Platelets: 242 10*3/uL (ref 150–400)
RBC: 5.21 MIL/uL — ABNORMAL HIGH (ref 3.87–5.11)
RDW: 14.4 % (ref 11.5–15.5)
WBC: 6.4 10*3/uL (ref 4.0–10.5)

## 2012-06-29 LAB — ETHANOL: Alcohol, Ethyl (B): <11 mg/dL (ref 0–11)

## 2012-06-29 LAB — COMPREHENSIVE METABOLIC PANEL
ALT: 21 U/L (ref 0–35)
AST: 25 U/L (ref 0–37)
Albumin: 3.8 g/dL (ref 3.5–5.2)
Alkaline Phosphatase: 105 U/L (ref 39–117)
BUN: 16 mg/dL (ref 6–23)
CO2: 27 mEq/L (ref 19–32)
Calcium: 10 mg/dL (ref 8.4–10.5)
Chloride: 97 mEq/L (ref 96–112)
Creatinine, Ser: 0.76 mg/dL (ref 0.50–1.10)
GFR calc Af Amer: >90 mL/min (ref >90)
GFR calc non Af Amer: 85 mL/min — ABNORMAL LOW (ref >90)
Glucose, Bld: 115 mg/dL — ABNORMAL HIGH (ref 70–99)
Potassium: 3.6 mEq/L (ref 3.5–5.1)
Sodium: 137 mEq/L (ref 135–145)
Total Bilirubin: 0.3 mg/dL (ref 0.3–1.2)
Total Protein: 8.1 g/dL (ref 6.0–8.3)

## 2012-06-29 LAB — RAPID URINE DRUG SCREEN, HOSP PERFORMED
Amphetamines: NOT DETECTED
Barbiturates: NOT DETECTED
Benzodiazepines: NOT DETECTED
Cocaine: NOT DETECTED
Opiates: NOT DETECTED
Tetrahydrocannabinol: NOT DETECTED

## 2012-06-29 MED ORDER — ACETAMINOPHEN 325 MG PO TABS: 650.0000 mg | ORAL_TABLET | ORAL | Status: DC | PRN

## 2012-06-29 MED ORDER — NICOTINE 21 MG/24HR TD PT24: 21.0000 mg | MEDICATED_PATCH | Freq: Every day | TRANSDERMAL | Status: DC | PRN

## 2012-06-29 MED ORDER — LAMOTRIGINE 200 MG PO TABS
200.0000 mg | ORAL_TABLET | Freq: Two times a day (BID) | ORAL | Status: DC
  Administered 2012-06-29: 200 mg via ORAL
  Filled 2012-06-29: qty 1

## 2012-06-29 MED ORDER — MAGNESIUM GLUCONATE 500 MG PO TABS
500.0000 mg | ORAL_TABLET | Freq: Every day | ORAL | Status: DC
  Administered 2012-06-29: 500 mg via ORAL
  Filled 2012-06-29: qty 1

## 2012-06-29 MED ORDER — LAMOTRIGINE 150 MG PO TABS
300.0000 mg | ORAL_TABLET | Freq: Every day | ORAL | Status: DC
  Administered 2012-06-29: 300 mg via ORAL
  Filled 2012-06-29: qty 2

## 2012-06-29 MED ORDER — ONDANSETRON HCL 4 MG PO TABS: 4.0000 mg | ORAL_TABLET | Freq: Three times a day (TID) | ORAL | Status: DC | PRN

## 2012-06-29 MED ORDER — DULOXETINE HCL 60 MG PO CPEP: 60.0000 mg | ORAL_CAPSULE | Freq: Every day | ORAL | Status: DC

## 2012-06-29 MED ORDER — LURASIDONE HCL 40 MG PO TABS
20.0000 mg | ORAL_TABLET | Freq: Every day | ORAL | Status: DC
  Administered 2012-06-29: 20 mg via ORAL
  Filled 2012-06-29: qty 1

## 2012-06-29 MED ORDER — TRAMADOL HCL 50 MG PO TABS: 50.0000 mg | ORAL_TABLET | Freq: Three times a day (TID) | ORAL | Status: DC | PRN

## 2012-06-29 MED ORDER — VALSARTAN-HYDROCHLOROTHIAZIDE 320-25 MG PO TABS: 1.0000 | ORAL_TABLET | Freq: Every day | ORAL | Status: DC

## 2012-06-29 MED ORDER — VITAMIN D 1000 UNITS PO TABS: 2000.0000 [IU] | ORAL_TABLET | Freq: Every day | ORAL | Status: DC

## 2012-06-29 MED ORDER — ESCITALOPRAM OXALATE 10 MG PO TABS: 10.0000 mg | ORAL_TABLET | Freq: Every day | ORAL | Status: DC

## 2012-06-29 MED ORDER — ROPINIROLE HCL 0.5 MG PO TABS
0.5000 mg | ORAL_TABLET | Freq: Two times a day (BID) | ORAL | Status: DC | PRN
  Administered 2012-06-29: 1 mg via ORAL
  Filled 2012-06-29: qty 2

## 2012-06-29 MED ORDER — LORAZEPAM 1 MG PO TABS
1.0000 mg | ORAL_TABLET | Freq: Three times a day (TID) | ORAL | Status: DC | PRN
  Administered 2012-06-29: 1 mg via ORAL
  Filled 2012-06-29: qty 1

## 2012-06-29 MED ORDER — ZOLPIDEM TARTRATE 5 MG PO TABS: 5.0000 mg | ORAL_TABLET | Freq: Every evening | ORAL | Status: DC | PRN

## 2012-06-29 MED ORDER — HYDROCHLOROTHIAZIDE 25 MG PO TABS: 25.0000 mg | ORAL_TABLET | Freq: Every day | ORAL | Status: DC

## 2012-06-29 MED ORDER — ALUM & MAG HYDROXIDE-SIMETH 200-200-20 MG/5ML PO SUSP: 30.0000 mL | ORAL | Status: DC | PRN

## 2012-06-29 MED ORDER — LEVOTHYROXINE SODIUM 75 MCG PO TABS
75.0000 ug | ORAL_TABLET | Freq: Every day | ORAL | Status: DC
  Filled 2012-06-29: qty 1

## 2012-06-29 MED ORDER — PROMETHAZINE HCL 25 MG PO TABS: 25.0000 mg | ORAL_TABLET | Freq: Four times a day (QID) | ORAL | Status: DC | PRN

## 2012-06-29 MED ORDER — OXYCODONE-ACETAMINOPHEN 5-325 MG PO TABS: 1.0000 | ORAL_TABLET | Freq: Four times a day (QID) | ORAL | Status: DC | PRN

## 2012-06-29 MED ORDER — IRBESARTAN 300 MG PO TABS: 300.0000 mg | ORAL_TABLET | Freq: Every day | ORAL | Status: DC

## 2012-06-29 NOTE — ED Provider Notes (Signed)
[name] is a 67 y.o. female who is here for evaluation of depression. [facility], for a walk-in appointment with a psychiatrist. While waiting she began to "blackout". She did not lose consciousness. She talked briefly with a caregiver. There, they called in a month, and she was sent here for further evaluation. The patient reports increased stress, with social problems, for several weeks. She has also been depressed and tearful. She feels like her anxiety is out of control. She feels like she needs to go back to the behavioral health [facility]. She was hospitalized in the psychiatric facility several years ago. She denies suicidal or homicidal ideation. She was seen in ED 3 weeks ago with chest pain, evaluated briefly and discharged.  Exam: Alert, calm, tearful. Heart regular rate and rhythm. No murmur. Lungs clear to auscultation. Neurologic grossly nonfocal. Psychiatric, appears depressed  Assessment: Depression with anxiety.  Plan; medical clearance evaluation followed by a tele- Psychiatry consultation and coordination with [facility] for possible placement.   Medical screening examination/treatment/procedure(s) were conducted as a shared visit with non-physician practitioner(s) and myself.  I personally evaluated the patient during the encounter  Flint Melter, MD 06/29/12 2204

## 2012-06-29 NOTE — ED Notes (Signed)
MD at bedside. 

## 2012-06-29 NOTE — ED Provider Notes (Signed)
Patient underwent evaluation via telemetry psychiatry.    Recommendations: #1 admit to inpatient psychiatry #2 IVC #3 DC Cymbalta  Nelia Shi, MD 06/29/12 [phone]

## 2012-06-29 NOTE — ED Notes (Signed)
Sat down to talk w/patient, she is crying and tearful and sad, car doesn't work, apartment is dirty and cluttered and she can't clean it, trash all outside back door, worried about getting it cleaned up,no money to pay for things to be done and daughter in jail, husband here and there,nohelp. Can't get to appointments in time to be seen and then facility is too busy to work her in or see her. Needs medication adjustment she thinks, has been to [facility] every day asking for help and no one will help her.

## 2012-06-29 NOTE — ED Notes (Signed)
Per EMS- Patient routinely goes to [facility] frequently for anxiety, bipolar and depression issues. [facility] sent patient to the ED for evaluation of medication changes. Patient anxious and tearful.

## 2012-06-29 NOTE — ED Provider Notes (Signed)
Medical screening examination/treatment/procedure(s) were conducted as a shared visit with non-physician practitioner(s) and myself.  I personally evaluated the patient during the encounter  Flint Melter, MD 06/29/12 2205

## 2012-06-29 NOTE — ED Provider Notes (Signed)
History     CSN: [id number]  Arrival date & time 06/29/12  4098   First MD Initiated Contact with Patient 06/29/12 1028      Chief Complaint  Patient presents with  . medicaton adjustment     sent from [facility] for evaluation of medication changes  . Medical Clearance    (Consider location/radiation/quality/duration/timing/severity/associated sxs/prior treatment) HPI Comments: 67 y.o. Female presents today from [place] via EMS. Patient routinely goes to [facility] for anxiety, bipolar and depression issues. Today at her appointment, she was told by [facility] that she should get her meds checked and sent her to the ED for evaluation of medication changes. Pt states that she used to be followed by Dr. Ladona Ridgel at [place], but he retired and she hasn't been able to be seen by anyone. Feels like no one will help her.   Pt states she has felt her anxiety increasing. Denies SI/HI ideations, denies audio/visual hallucinations. States she has not been eating over the last few days and that she is increasingly fearful to leave her house. Basically just leaves for food shopping and to go to [place]. As per pt, no support system. Finding it increasingly overwhelmed with life and does not think she can function at home any more. She sleeps 16 hours a day, has stopped bathing, and has a general feeling of malaise and not caring about anything. Pt becomes tearful during discussion.   Pt has no somatic complaints, but admits at times her heart starts to race and she wonders if it is her anxiety or if she is having a heart attack. She was recently seen at the ED where she was discharged with no indication of cardiac etiology.    Past Medical History  Diagnosis Date  . Abscess of Bartholin's gland   . History of pleural effusion   . History of attention deficit disorder   . History of endometriosis   . RLS (restless legs syndrome)   . Anxiety   . Obesity   . GERD (gastroesophageal reflux disease)   .  Malignant neoplasm of bladder, part unspecified   . Renal mass 02/15/2012  . Dysrhythmia     h/o ventricular tachycardia- ablation resolved it  . Hypothyroidism   . Depression     Bipolar disorder  . OSA (obstructive sleep apnea)     uses oral appliance instead of CPAP  . Vertigo   . Chronic kidney disease     kidney cancer- pt states she has elected to not have it treated.  Marland Kitchen PONV (postoperative nausea and vomiting)   . Difficult intubation     told by [facility] that she was hard to intubate 15b yrs ago in [place]- surgery since then no problems  . Adverse effect of general anesthetic     felt paralyzed while receiving anesthesia  . Bipolar 1 disorder   . PTSD (post-traumatic stress disorder)     Past Surgical History  Procedure Laterality Date  . Thyroidectomy  1990  . Tubal ligation  1970s  . Nasoseptal reconstruction  1990s  . Cardiac electrophysiology mapping and ablation  2000s  . Tonsillectomy      as a child  . Uterine mass removal  03/2012    was found to be benign    Family History  Problem Relation Age of Onset  . Allergies Father   . Allergies Sister     multiple  . Allergies Brother     multiple  . Allergies Daughter   . Heart  disease Mother   . Heart disease Maternal Grandfather   . Colon cancer Paternal Grandmother   . Cancer Paternal Grandfather   . Skin cancer Father     History  Substance Use Topics  . Smoking status: Never Smoker   . Smokeless tobacco: Never Used  . Alcohol Use: No    OB History   Grav Para Term Preterm Abortions TAB SAB Ect Mult Living                  Review of Systems  Constitutional: Positive for activity change, appetite change and fatigue. Negative for fever and diaphoresis.       Pt not eating like she used to. Getting afraid to leave her house. Sleeping 16 hours a day  HENT: Negative for neck pain and neck stiffness.   Eyes: Negative for visual disturbance.  Respiratory: Negative for apnea, chest tightness and shortness  of breath.   Cardiovascular: Negative for chest pain and palpitations.  Gastrointestinal: Negative for nausea, vomiting, diarrhea and constipation.  Genitourinary: Negative for dysuria.  Musculoskeletal: Negative for gait problem.  Skin: Negative for rash.  Neurological: Negative for dizziness, weakness, light-headedness, numbness and headaches.  Psychiatric/Behavioral: Positive for sleep disturbance, dysphoric mood, decreased concentration and agitation. Negative for suicidal ideas, hallucinations and self-injury. The patient is nervous/anxious. The patient is not hyperactive.        Sleeping most of the day. Feels life is overwhelming    Allergies  Geodon; Lithium; Talwin; and Toradol  Home Medications   Current Outpatient Rx  Name  Route  Sig  Dispense  Refill  . Cholecalciferol (VITAMIN D) 2000 UNITS CAPS   Oral   Take 1 capsule by mouth daily.         . DULoxetine (CYMBALTA) 30 MG capsule   Oral   Take 60 mg by mouth daily.          Marland Kitchen lamoTRIgine (LAMICTAL) 200 MG tablet   Oral   Take 300 mg by mouth daily.         Marland Kitchen levothyroxine (SYNTHROID, LEVOTHROID) 75 MCG tablet   Oral   Take 75 mcg by mouth daily.          Marland Kitchen LORazepam (ATIVAN) 1 MG tablet   Oral   Take 1 mg by mouth every 8 (eight) hours as needed. For anxiety         . magnesium gluconate (MAGONATE) 500 MG tablet   Oral   Take 500 mg by mouth daily.         Marland Kitchen oxyCODONE-acetaminophen (PERCOCET/ROXICET) 5-325 MG per tablet   Oral   Take 1 tablet by mouth every 6 (six) hours as needed for pain.         . promethazine (PHENERGAN) 25 MG tablet   Oral   Take 25 mg by mouth daily as needed. For nausea         . rOPINIRole (REQUIP) 0.5 MG tablet   Oral   Take 1 mg by mouth daily as needed. Takes during the day if needed for restless leg syndrome         . traMADol (ULTRAM) 50 MG tablet   Oral   Take 50-100 mg by mouth daily as needed. For headaches/migraines         .  valsartan-hydrochlorothiazide (DIOVAN-HCT) 320-25 MG per tablet   Oral   Take 1 tablet by mouth daily.             BP 134/74  Pulse 93  Temp(Src) 98.3 F (36.8 C) (Oral)  SpO2 97%  Physical Exam  Nursing note and vitals reviewed. Constitutional: She is oriented to person, place, and time. She appears well-developed and well-nourished. No distress.  HENT:  Head: Normocephalic and atraumatic.  Eyes: Conjunctivae and EOM are normal.  Neck: Normal range of motion. Neck supple.  No meningeal signs  Cardiovascular: Normal rate, regular rhythm and normal heart sounds.  Exam reveals no gallop and no friction rub.   No murmur heard. Pulmonary/Chest: Effort normal and breath sounds normal. No respiratory distress. She has no wheezes. She has no rales. She exhibits no tenderness.  Abdominal: Soft. Bowel sounds are normal. She exhibits no distension. There is no tenderness. There is no rebound and no guarding.  Musculoskeletal: Normal range of motion. She exhibits no edema and no tenderness.  Neurological: She is alert and oriented to person, place, and time. No cranial nerve deficit.  Skin: Skin is warm and dry. She is not diaphoretic. No erythema.  Psychiatric:  High level of anxiety, low coping skills. Pt tearful during discussion, often grabbing at her hair in frustration as we spoke    ED Course  Procedures (including critical care time)  Labs Reviewed  CBC WITH DIFFERENTIAL - Abnormal; Notable for the following:    RBC 5.21 (*)    All other components within normal limits  COMPREHENSIVE METABOLIC PANEL - Abnormal; Notable for the following:    Glucose, Bld 115 (*)    GFR calc non Af Amer 85 (*)    All other components within normal limits  URINE RAPID DRUG SCREEN (HOSP PERFORMED)  ETHANOL   No results found.   No diagnosis found.    MDM  67 y.o. Female presents today from [facility] via EMS for evaluation of her bipolar/depression issues and medication. Pt anxiety  increasing and coping skills decreasing with no support system. Denies SI/HI ideations, denies audio/visual hallucinations.  Demeanor is dysphoric. Admits sleeping most of the day away, loss of interests, feelings of worthlessness, lack of energy, difficulty concentrating, loss of appetite, feelings of anxiety.  The patient currently does not have any acute physical complaints and is in no acute distress. Pt has no somatic complaints, but admits at times her heart starts to race and she wonders if it is her anxiety or if she is having a heart attack. She was recently seen at the ED where she was discharged with no indication of cardiac etiology. Lab work today is unremarkable.   Discussed pt case and pt was seen by Dr. Effie Shy. Agreed that tele-psych was appropriate and pt was moved to Psych ED for further evaluation.          Glade Nurse, PA-C 06/29/12 1700  Glade Nurse, PA-C 06/29/12 1715

## 2012-06-29 NOTE — BH Assessment (Signed)
Assessment Note   [name] is an 67 y.o. female. Patient with history of depression, anxiety,  PTSD, and Bipolar Disorder NOS. She reports increased depression triggered by situational issues. She explains that she is lonely, has no primary support, daughter is in jail and their relationship is estranged;  no money to purchased basic items for her basic needs; car doesn't work well; apartment is dirty and cluttered as she unable to keep her apartment clean; back yard is filled with trash stating she has no energy to walk to [employer]; spouse left and divorced her 3 yrs ago; patient has difficulty getting to doctors appointments including her psychiatrist at [place] because she forgets dates. Patient reports feeling so overwhelmed and anxious about her stressors she went to the ER 2-3 weeks ago thinking she was having a heart attack. Patient sts that she was medically cleared and told she was having anxiety. Patient was instructed to follow up with her psychiatrist at [facility]. Sts that she made an appointment approximately after the incident to discuss her anxiety and possible medication changes. Patient missed her appointment stating she confused her dates. She presented to [facility] today as a walk-in and sts she became anxious; "slightly passed out"; EMS was called; patient brought here to [facility] for medical clearance.   Today patient denies SI, HI, and AVH's.  Patient denies history of self harm. Patient sts she is only depression with increased anxiety for the last several weeks and nothing more. Patient completed a telepsych and sts that psychiatrist-Dr. Berlin Hun told her that she has a form of suicidal intent due to her refusing medical treatment. Patient explains that she has kidney disease (kidney cancer). Sts that she has chosen not to seek treatment. She has discussed this decision with there oncologist. Says that she knows and understand that without treatment she will die. Patients sts, "This  is what I want and I fully understand the consequences of my decision". Patient is at peace with her decision and says, "I  would rather die as oppose to living this way".  Patient clarified that he was referring to her on-going depression, anxiety, and situation stressors. Patient depression is evidence by (vegetative symptoms) sleeping all day, laying in the bed, not grooming. Her symptoms isolating herself from others stating she is becoming agoraphobic.   Patient reports previous hospitalizations at [facility] 02/14/2010, 02/10/2006, 09/14/2004, 08/17/2003.   Axis I: Bipolar, Depressed, Generalized Anxiety Disorder, PTSD Axis II: Deferred Axis III:  Past Medical History  Diagnosis Date  . Abscess of Bartholin's gland   . History of pleural effusion   . History of attention deficit disorder   . History of endometriosis   . RLS (restless legs syndrome)   . Anxiety   . Obesity   . GERD (gastroesophageal reflux disease)   . Malignant neoplasm of bladder, part unspecified   . Renal mass 02/15/2012  . Dysrhythmia     h/o ventricular tachycardia- ablation resolved it  . Hypothyroidism   . Depression     Bipolar disorder  . OSA (obstructive sleep apnea)     uses oral appliance instead of CPAP  . Vertigo   . Chronic kidney disease     kidney cancer- pt states she has elected to not have it treated.  Marland Kitchen PONV (postoperative nausea and vomiting)   . Difficult intubation     told by [facility] that she was hard to intubate 15b yrs ago in [place]- surgery since then no problems  . Adverse  effect of general anesthetic     felt paralyzed while receiving anesthesia  . Bipolar 1 disorder   . PTSD (post-traumatic stress disorder)    Axis IV: housing problems, other psychosocial or environmental problems, problems related to social environment, problems with access to health care services and problems with primary support group Axis V: 31-40 impairment in reality testing  Past Medical History:  Past Medical  History  Diagnosis Date  . Abscess of Bartholin's gland   . History of pleural effusion   . History of attention deficit disorder   . History of endometriosis   . RLS (restless legs syndrome)   . Anxiety   . Obesity   . GERD (gastroesophageal reflux disease)   . Malignant neoplasm of bladder, part unspecified   . Renal mass 02/15/2012  . Dysrhythmia     h/o ventricular tachycardia- ablation resolved it  . Hypothyroidism   . Depression     Bipolar disorder  . OSA (obstructive sleep apnea)     uses oral appliance instead of CPAP  . Vertigo   . Chronic kidney disease     kidney cancer- pt states she has elected to not have it treated.  Marland Kitchen PONV (postoperative nausea and vomiting)   . Difficult intubation     told by [facility] that she was hard to intubate 15b yrs ago in [place]- surgery since then no problems  . Adverse effect of general anesthetic     felt paralyzed while receiving anesthesia  . Bipolar 1 disorder   . PTSD (post-traumatic stress disorder)     Past Surgical History  Procedure Laterality Date  . Thyroidectomy  1990  . Tubal ligation  1970s  . Nasoseptal reconstruction  1990s  . Cardiac electrophysiology mapping and ablation  2000s  . Tonsillectomy      as a child  . Uterine mass removal  03/2012    was found to be benign    Family History:  Family History  Problem Relation Age of Onset  . Allergies Father   . Allergies Sister     multiple  . Allergies Brother     multiple  . Allergies Daughter   . Heart disease Mother   . Heart disease Maternal Grandfather   . Colon cancer Paternal Grandmother   . Cancer Paternal Grandfather   . Skin cancer Father     Social History:  reports that she has never smoked. She has never used smokeless tobacco. She reports that she does not drink alcohol or use illicit drugs.  Additional Social History:  Alcohol / Drug Use Pain Medications: SEE MAR Prescriptions: SEE MAR Over the Counter: SEE MAR History of alcohol / drug  use?: No history of alcohol / drug abuse  CIWA: CIWA-Ar BP: 134/74 mmHg Pulse Rate: 93 COWS:    Allergies:  Allergies  Allergen Reactions  . Geodon (Ziprasidone Hydrochloride) Other (See Comments)    Extremely aggitated  . Lithium Nausea Only and Other (See Comments)    Off balance, increased heart rate  . Talwin (Pentazocine) Other (See Comments)    Chest pain  . Toradol (Ketorolac Tromethamine) Other (See Comments)    hallucinations    Home Medications:  (Not in a hospital admission)  OB/GYN Status:  No LMP recorded. Patient is postmenopausal.  General Assessment Data [facility] of [facility]: [facility] Living Arrangements: Alone Can pt return to current living arrangement?: Yes Admission Status: Voluntary Is patient capable of signing voluntary admission?: Yes Transfer from: Acute Hospital Referral  Source: Self/Family/Friend     Risk to self Suicidal Ideation: Yes-Currently Present (patient denies,however; per telepsych patient is suicidal ) Suicidal Intent: Yes-Currently Present (patient denies; however; per telepsych refuses medical treat) Is patient at risk for suicide?:  (according to telepsych patient is at risk for self harm) Suicidal Plan?: Yes-Currently Present Specify Current Suicidal Plan:  (pt denies plan but refuses medical tx for kidney failure) Access to Means: No What has been your use of drugs/alcohol within the last 12 months?:  (no alcohol or drug use reports) Previous Attempts/Gestures: No How many times?:  (n/a) Other Self Harm Risks:  (n/a) Triggers for Past Attempts:  (no prevous and/or gestures) Intentional Self Injurious Behavior: None Family Suicide History: Yes (father-bipolar, mother-depression, daughter-depression, gran) Recent stressful life event(s): Loss (Comment);Financial Problems;Other (Comment) (no $, living off of social supports-fstamps,housing, medical) Persecutory voices/beliefs?: No Depression: Yes Depression Symptoms: Feeling  angry/irritable;Feeling worthless/self pity;Loss of interest in usual pleasures;Fatigue;Guilt;Isolating;Tearfulness;Insomnia;Despondent Substance abuse history and/or treatment for substance abuse?: No Suicide prevention information given to non-admitted patients: Not applicable  Risk to Others Homicidal Ideation: No Thoughts of Harm to Others: No Current Homicidal Intent: No Current Homicidal Plan: No Access to Homicidal Means: No Identified Victim:  (n/a) History of harm to others?: No Assessment of Violence: None Noted Violent Behavior Description:  (patient is calm and cooperative) Does patient have access to weapons?: No Criminal Charges Pending?: No Does patient have a court date: No  Psychosis Hallucinations: None noted Delusions: None noted  Mental Status Report Appear/Hygiene: Disheveled Eye Contact: Good Motor Activity: Freedom of movement Speech: Logical/coherent Level of Consciousness: Alert Mood: Depressed;Sad Affect: Appropriate to circumstance Anxiety Level: Panic Attacks Panic attack frequency:  (3 weeks ago pt went to hospital thinking she was having a HA) Most recent panic attack:  (3 weeks ago) Thought Processes: Coherent;Relevant Judgement: Impaired Orientation: Person;Place;Time;Situation Obsessive Compulsive Thoughts/Behaviors: None  Cognitive Functioning Concentration: Decreased Memory: Recent Intact;Remote Intact IQ: Average Insight: Poor Impulse Control: Good Appetite: Good Weight Loss:  (none reported) Weight Gain:  (pt reports gain wt. due to psych meds; unk how much) Sleep: Decreased (patient has sleep apnea; wakes up frequently) Total Hours of Sleep:  (8 or more hours) Vegetative Symptoms: None  ADLScreening [facility] Assessment Services) Patient's cognitive ability adequate to safely complete daily activities?: Yes Patient able to express need for assistance with ADLs?: Yes Independently performs ADLs?: Yes (appropriate for developmental  age)  Abuse/Neglect [facility]) Physical Abuse: Denies Verbal Abuse: Denies Sexual Abuse: Denies  Prior Inpatient Therapy Prior Inpatient Therapy: Yes Prior Therapy Dates:  (2005,2006,2007,2011) Prior Therapy Facilty/Provider(s):  ([facility] admissions) Reason for Treatment:  (depression, anxiety, medication managment)  Prior Outpatient Therapy Prior Outpatient Therapy: Yes Prior Therapy Dates:  (currently) Prior Therapy Facilty/Provider(s):  Museum/gallery curator) Reason for Treatment:  (medication managment and therapy)  ADL Screening (condition at time of admission) Patient's cognitive ability adequate to safely complete daily activities?: Yes Patient able to express need for assistance with ADLs?: Yes Independently performs ADLs?: Yes (appropriate for developmental age) Weakness of Legs: None Weakness of Arms/Hands: None  Home Assistive Devices/Equipment Home Assistive Devices/Equipment: None  Therapy Consults (therapy consults require a physician order) PT Evaluation Needed: No OT Evalulation Needed: No SLP Evaluation Needed: No Abuse/Neglect Assessment (Assessment to be complete while patient is alone) Physical Abuse: Denies Verbal Abuse: Denies Sexual Abuse: Denies Exploitation of patient/patient's resources: Denies Self-Neglect: Denies Possible abuse reported to:: [facility] department of social services Values / Beliefs Cultural Requests During Hospitalization: None Spiritual Requests During Hospitalization: None Consults  Spiritual Care Consult Needed: No Social Work Consult Needed: No Merchant navy officer (For Healthcare) Advance Directive: Patient does not have advance directive Nutrition Screen- [facility] Adult/[facility] Patient's home diet: Regular Have you recently lost weight without trying?: No Have you been eating poorly because of a decreased appetite?: No Malnutrition Screening Tool Score: 0  Additional Information 1:1 In Past 12 Months?: No CIRT Risk: No Elopement Risk: No Does  patient have medical clearance?: Yes     Disposition:  Disposition Initial Assessment Completed for this Encounter: Yes Disposition of Patient: Inpatient treatment program;Referred to [facility]) Type of inpatient treatment program: Adult Patient referred to: Other (Comment) [facility] and [facility]-pending review)  On Site Evaluation by:   Reviewed with Physician:     Melynda Ripple [facility] 06/29/2012 5:16 PM

## 2012-06-29 NOTE — ED Notes (Signed)
[id number]<XB> Expected date:<BR> Expected time:<BR> Means of arrival:<BR> Comments:<BR> Anxiety, monarch

## 2012-06-30 ENCOUNTER — Encounter (HOSPITAL_COMMUNITY): Payer: Self-pay | Admitting: *Deleted

## 2012-06-30 DIAGNOSIS — F339 Major depressive disorder, recurrent, unspecified: Secondary | ICD-10-CM | POA: Diagnosis present

## 2012-06-30 DIAGNOSIS — F411 Generalized anxiety disorder: Secondary | ICD-10-CM | POA: Diagnosis present

## 2012-06-30 DIAGNOSIS — F3130 Bipolar disorder, current episode depressed, mild or moderate severity, unspecified: Secondary | ICD-10-CM

## 2012-06-30 DIAGNOSIS — F4310 Post-traumatic stress disorder, unspecified: Secondary | ICD-10-CM

## 2012-06-30 MED ORDER — IRBESARTAN 300 MG PO TABS
300.0000 mg | ORAL_TABLET | Freq: Every day | ORAL | Status: DC
  Administered 2012-06-30 – 2012-07-05 (×6): 300 mg via ORAL
  Filled 2012-06-30 (×9): qty 1

## 2012-06-30 MED ORDER — HYDROCHLOROTHIAZIDE 25 MG PO TABS
25.0000 mg | ORAL_TABLET | Freq: Every day | ORAL | Status: DC
  Administered 2012-06-30 – 2012-07-05 (×6): 25 mg via ORAL
  Filled 2012-06-30 (×9): qty 1

## 2012-06-30 MED ORDER — DULOXETINE HCL 60 MG PO CPEP
60.0000 mg | ORAL_CAPSULE | Freq: Every day | ORAL | Status: DC
  Administered 2012-06-30 – 2012-07-05 (×6): 60 mg via ORAL
  Filled 2012-06-30 (×9): qty 1

## 2012-06-30 MED ORDER — LORAZEPAM 1 MG PO TABS
1.0000 mg | ORAL_TABLET | Freq: Three times a day (TID) | ORAL | Status: DC | PRN
  Administered 2012-06-30 – 2012-07-05 (×11): 1 mg via ORAL
  Filled 2012-06-30 (×10): qty 1

## 2012-06-30 MED ORDER — LAMOTRIGINE 200 MG PO TABS
200.0000 mg | ORAL_TABLET | Freq: Two times a day (BID) | ORAL | Status: DC
  Administered 2012-06-30 – 2012-07-05 (×11): 200 mg via ORAL
  Filled 2012-06-30 (×15): qty 1

## 2012-06-30 MED ORDER — LORAZEPAM 1 MG PO TABS
ORAL_TABLET | ORAL | Status: AC
  Administered 2012-06-30: 1 mg via ORAL
  Filled 2012-06-30: qty 1

## 2012-06-30 MED ORDER — ARIPIPRAZOLE 2 MG PO TABS
2.0000 mg | ORAL_TABLET | Freq: Every day | ORAL | Status: DC
  Administered 2012-07-01 – 2012-07-05 (×5): 2 mg via ORAL
  Filled 2012-06-30 (×7): qty 1

## 2012-06-30 MED ORDER — ROPINIROLE HCL 1 MG PO TABS
2.0000 mg | ORAL_TABLET | Freq: Every day | ORAL | Status: DC
  Administered 2012-06-30 – 2012-07-04 (×5): 2 mg via ORAL
  Filled 2012-06-30 (×7): qty 2
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
## 2012-06-30 NOTE — [facility] Group Notes (Signed)
[facility] LCSW Aftercare Discharge Planning Group Note   06/30/2012 2:34 PM  Participation Quality:  Appropriate  Mood/Affect:  Depressed and Flat  Depression Rating:  5  Anxiety Rating:  5  Thoughts of Suicide:  No Will you contract for safety?   NA  Current AVH:  No  Plan for Discharge/Comment  Patient advised of admitting to hospital with increased depression and anxiety.  She shared her symptoms have increased over the past three weeks.  Patient advised of having home, transportation, and access to medications.  She is followed outpatient by [facility].  Transportation Means:   Supports:  [name], [name]
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
## 2012-06-30 NOTE — [facility] Suicide Risk Assessment (Signed)
Suicide Risk Assessment  Admission Assessment     Nursing information obtained from:  Patient Demographic factors:  Age 67 or older;Divorced or widowed;Caucasian;Living alone;Unemployed;Low socioeconomic status Current Mental Status:   (overwhelmed, not caring for self) Loss Factors:  Financial problems / change in socioeconomic status;Decline in physical health (no support) Historical Factors:  Family history of mental illness or substance abuse Risk Reduction Factors:  Positive social support (via friends)  CLINICAL FACTORS:   Severe Anxiety and/or Agitation Panic Attacks Depression:   Anhedonia Hopelessness Insomnia Severe  COGNITIVE FEATURES THAT CONTRIBUTE TO RISK:  Closed-mindedness Thought constriction (tunnel vision)    SUICIDE RISK:   Moderate:  Frequent suicidal ideation with limited intensity, and duration, some specificity in terms of plans, no associated intent, good self-control, limited dysphoria/symptomatology, some risk factors present, and identifiable protective factors, including available and accessible social support.  PLAN OF CARE: Supportive approach/coping skills                                          Reassess and optimize treatment with medications  I certify that inpatient services furnished can reasonably be expected to improve the patient's condition.  [name] A 06/30/2012, 3:35 PM

## 2012-06-30 NOTE — Progress Notes (Signed)
Pt is a 67 yr old female invol admited from [place] via [place]. Pt experienced passing out/anxiety episode this morning while at [facility]. Was sent with recommend for med adjustment due to excessive anxiety, decreased ADLs, confusion and decreased concentration. Pt has terminal kidney cancer and has elected to not treat. States she is completely at peace with her decision and has discussed at length with her oncologist. However telepsych felt this was a form of suicidal risk and intent. Pt calm, cooperative, anxious, depressed. Oriented to unit, Level III obs initiated. Provided fluids, food. She continues to deny SI/HI/AVH. Please see chart for medical problems, allergies. Lawrence Marseilles

## 2012-06-30 NOTE — Progress Notes (Signed)
D: Patient resting in bed with eyes closed.  Respirations even and unlabored.  Patient appears to be in no apparent distress. A: Staff to monitor Q 15 mins for safety.   R:Patient remains safe on the unit.  

## 2012-06-30 NOTE — Progress Notes (Signed)
[facility] Group Notes:  (Nursing/MHT/Case Management/Adjunct)  Date:  06/30/2012  Time:  2000  Type of Therapy:  Psychoeducational Skills  Participation Level:  Active  Participation Quality:  Attentive  Affect:  Appropriate  Cognitive:  Appropriate  Insight:  Good  Engagement in Group:  Engaged  Modes of Intervention:  Education  Summary of Progress/Problems: The patient shared with the group that she had a bad morning, but her afternoon was much improved. She stated that she continues to spend a great deal of time in bed. On a more positive note, the patient expressed that she has to continue to work on the steps for her recovery. Her goal for tomorrow is to address her anxiety.   [name] 06/30/2012, 11:25 PM

## 2012-06-30 NOTE — H&P (Signed)
Psychiatric Admission Assessment Adult  Patient Identification:  [name] Date of Evaluation:  06/30/2012 Chief Complaint:  Bipolar, depressed    Generalized Anxiety Disorder PTSD History of Present Illness: [name] is an 67 y.o. female. Patient with history of depression, anxiety, PTSD, and Bipolar Disorder NOS. She reports increased depression triggered by situational issues. She explains that she is lonely, has no primary support, daughter is in jail and their relationship is estranged; no money to purchased basic items for her basic needs; car doesn't work well; apartment is dirty and cluttered as she unable to keep her apartment clean; back yard is filled with trash stating she has no energy to walk to [employer]; spouse left and divorced her 3 yrs ago; patient has difficulty getting to doctors appointments including her psychiatrist at [facility] because she forgets dates. Patient reports feeling so overwhelmed and anxious about her stressors she went to the ER 2-3 weeks ago thinking she was having a heart attack. Patient sts that she was medically cleared and told she was having anxiety. Patient was instructed to follow up with her psychiatrist at [facility]. Sts that she made an appointment approximately after the incident to discuss her anxiety and possible medication changes. Patient missed her appointment stating she confused her dates. She presented to [facility] today as a walk-in and sts she became anxious; "slightly passed out".  Today patient complains of feeling very depressed and anxious. Patient has been feeling anxious for weeks reporting "I stay in the bed and can hardly do anything. Three weeks ago I began to have more anxiety symptoms. I am dating a man I love but he has MS, I had to tell him that I didn't think it could work out. I think maybe it had a bigger impact than I realized." Patient relates that her kidney cancer is "not the biggest stressor I have. I have always  lived a good life financial but the last divorce broke me. I am now trying to live off $799 per month. I'm not suicidal but I'm tired of struggling". The patient feels like her Cymbalta is no longer helpful stating "I've been on it for five years." Her depression is rated at six and anxiety at seven. Patient reports feeling high anxiety and experiences palpitations at times stating "My friends took me to the ED thinking something was wrong with my heart, but I think it's my anxiety being so bad." The patient would like her medications to be changed.     Elements:  Location:  [facility] in-patient. Quality:  Severe anxiety and depression. Severity:  Severe. Timing:  Worsening over the last three weeks. Duration:  Over several years. Context:  Financial, Medical, and Relationship problems. Associated Signs/Synptoms: Depression Symptoms:  depressed mood, fatigue, feelings of worthlessness/guilt, hopelessness, anxiety, insomnia, loss of energy/fatigue, (Hypo) Manic Symptoms:  Irritable Mood, Anxiety Symptoms:  Agoraphobia, Panic Symptoms, Psychotic Symptoms:  Denies PTSD Symptoms: Denies  Psychiatric Specialty Exam: Physical Exam-Results from ED reviewed.   Review of Systems  Constitutional: Negative.   HENT: Negative.   Eyes: Negative.   Respiratory: Negative.   Cardiovascular: Positive for palpitations (Experiences these when feeling anxious. ).  Gastrointestinal: Negative.   Genitourinary: Negative.   Musculoskeletal: Positive for back pain.  Skin: Negative.   Neurological: Negative.   Endo/Heme/Allergies: Negative.   Psychiatric/Behavioral: Positive for depression and substance abuse. Negative for suicidal ideas, hallucinations and memory loss. The patient is nervous/anxious and has insomnia.     Blood pressure 133/86, pulse  100, temperature 97.1 F (36.2 C), temperature source Oral, resp. rate 20, height 5\' 5"  (1.651 m), weight 100.699 kg (222 lb).Body mass index is 36.94 kg/(m^2).   General Appearance: Casual  Eye Contact::  Fair  Speech:  Clear and Coherent  Volume:  Normal  Mood:  Depressed and Hopeless  Affect:  Full Range  Thought Process:  Goal Directed and Intact  Orientation:  Full (Time, Place, and Person)  Thought Content:  WDL  Suicidal Thoughts:  No  Homicidal Thoughts:  No  Memory:  Immediate;   Good Recent;   Good Remote;   Good  Judgement:  Fair  Insight:  Good  Psychomotor Activity:  Normal  Concentration:  Fair  Recall:  Good  Akathisia:  No  Handed:  Right  AIMS (if indicated):     Assets:  Communication Skills Desire for Improvement Leisure Time Resilience Social Support  Sleep:  Number of Hours: 5.25    Past Psychiatric History: [facility] 2007 and 2011 Diagnosis: Bipolar  Hospitalizations:As above  Outpatient Care:  Substance Abuse Care:None  Self-Mutilation:None  Suicidal Attempts:None  Violent Behaviors:None   Past Medical History:   Past Medical History  Diagnosis Date  . Abscess of Bartholin's gland   . History of pleural effusion   . History of attention deficit disorder   . History of endometriosis   . RLS (restless legs syndrome)   . Anxiety   . Obesity   . GERD (gastroesophageal reflux disease)   . Malignant neoplasm of bladder, part unspecified   . Renal mass 02/15/2012  . Dysrhythmia     h/o ventricular tachycardia- ablation resolved it  . Hypothyroidism   . Depression     Bipolar disorder  . OSA (obstructive sleep apnea)     uses oral appliance instead of CPAP  . Vertigo   . Chronic kidney disease     kidney cancer- pt states she has elected to not have it treated.  Marland Kitchen PONV (postoperative nausea and vomiting)   . Difficult intubation     told by [facility] that she was hard to intubate 15b yrs ago in [place]- surgery since then no problems  . Adverse effect of general anesthetic     felt paralyzed while receiving anesthesia  . Bipolar 1 disorder   . PTSD (post-traumatic stress disorder)    None. Allergies:    Allergies  Allergen Reactions  . Geodon (Ziprasidone Hydrochloride) Other (See Comments)    Extremely aggitated  . Lithium Nausea Only and Other (See Comments)    Off balance, increased heart rate  . Talwin (Pentazocine) Other (See Comments)    Chest pain  . Toradol (Ketorolac Tromethamine) Other (See Comments)    hallucinations   PTA Medications: Prescriptions prior to admission  Medication Sig Dispense Refill  . Cholecalciferol (VITAMIN D) 2000 UNITS CAPS Take 1 capsule by mouth daily.      . DULoxetine (CYMBALTA) 30 MG capsule Take 60 mg by mouth daily.       Marland Kitchen lamoTRIgine (LAMICTAL) 200 MG tablet Take 300 mg by mouth daily.      Marland Kitchen levothyroxine (SYNTHROID, LEVOTHROID) 75 MCG tablet Take 75 mcg by mouth daily.       Marland Kitchen LORazepam (ATIVAN) 1 MG tablet Take 1 mg by mouth every 8 (eight) hours as needed. For anxiety      . magnesium gluconate (MAGONATE) 500 MG tablet Take 500 mg by mouth daily.      Marland Kitchen oxyCODONE-acetaminophen (PERCOCET/ROXICET) 5-325 MG per tablet Take 1  tablet by mouth every 6 (six) hours as needed for pain.      . promethazine (PHENERGAN) 25 MG tablet Take 25 mg by mouth every 6 (six) hours as needed for nausea.       Marland Kitchen rOPINIRole (REQUIP) 0.5 MG tablet Take 0.5-1 mg by mouth 2 (two) times daily as needed. Takes during the day if needed for restless leg syndrome      . traMADol (ULTRAM) 50 MG tablet Take 50-100 mg by mouth every 8 (eight) hours as needed for pain.       . valsartan-hydrochlorothiazide (DIOVAN-HCT) 320-25 MG per tablet Take 1 tablet by mouth daily.          Previous Psychotropic Medications:  Medication/Dose-Unable to recall dosages  Geodon-Increased anxiety  Lithium-Made me feel bad physically             Substance Abuse History in the last 12 months:  no  Consequences of Substance Abuse: NA  Social History:  reports that she has never smoked. She has never used smokeless tobacco. She reports that she does not drink alcohol or use illicit  drugs. Additional Social History:                      Current Place of Residence:   Place of Birth:   Family Members: Marital Status:  Divorced Children:  Sons:  Daughters: Relationships: Education:  Corporate treasurer Problems/Performance: Religious Beliefs/Practices: History of Abuse (Emotional/Phsycial/Sexual) Teacher, music History:  None. Legal History: Hobbies/Interests:Enjoys making jewerly.   Family History:   Family History  Problem Relation Age of Onset  . Allergies Father   . Allergies Sister     multiple  . Allergies Brother     multiple  . Allergies Daughter   . Heart disease Mother   . Heart disease Maternal Grandfather   . Colon cancer Paternal Grandmother   . Cancer Paternal Grandfather   . Skin cancer Father     Results for orders placed during the hospital encounter of 06/29/12 (from the past 72 hour(s))  CBC WITH DIFFERENTIAL     Status: Abnormal   Collection Time    06/29/12 10:30 AM      Result Value Range   WBC 6.4  4.0 - 10.5 K/uL   RBC 5.21 (*) 3.87 - 5.11 MIL/uL   Hemoglobin 14.4  12.0 - 15.0 g/dL   HCT 40.9  81.1 - 91.4 %   MCV 83.1  78.0 - 100.0 fL   MCH 27.6  26.0 - 34.0 pg   MCHC 33.3  30.0 - 36.0 g/dL   RDW 78.2  95.6 - 21.3 %   Platelets 242  150 - 400 K/uL   Neutrophils Relative 69  43 - 77 %   Neutro Abs 4.4  1.7 - 7.7 K/uL   Lymphocytes Relative 22  12 - 46 %   Lymphs Abs 1.4  0.7 - 4.0 K/uL   Monocytes Relative 7  3 - 12 %   Monocytes Absolute 0.4  0.1 - 1.0 K/uL   Eosinophils Relative 2  0 - 5 %   Eosinophils Absolute 0.2  0.0 - 0.7 K/uL   Basophils Relative 0  0 - 1 %   Basophils Absolute 0.0  0.0 - 0.1 K/uL  COMPREHENSIVE METABOLIC PANEL     Status: Abnormal   Collection Time    06/29/12 10:30 AM      Result Value Range   Sodium 137  135 - 145  mEq/L   Potassium 3.6  3.5 - 5.1 mEq/L   Chloride 97  96 - 112 mEq/L   CO2 27  19 - 32 mEq/L   Glucose, Bld 115 (*) 70 - 99 mg/dL   BUN 16   6 - 23 mg/dL   Creatinine, Ser 1.61  0.50 - 1.10 mg/dL   Calcium 09.6  8.4 - 04.5 mg/dL   Total Protein 8.1  6.0 - 8.3 g/dL   Albumin 3.8  3.5 - 5.2 g/dL   AST 25  0 - 37 U/L   ALT 21  0 - 35 U/L   Alkaline Phosphatase 105  39 - 117 U/L   Total Bilirubin 0.3  0.3 - 1.2 mg/dL   GFR calc non Af Amer 85 (*) >90 mL/min   GFR calc Af Amer >90  >90 mL/min   Comment:            The eGFR has been calculated     using the CKD EPI equation.     This calculation has not been     validated in all clinical     situations.     eGFR's persistently     <90 mL/min signify     possible Chronic Kidney Disease.  ETHANOL     Status: None   Collection Time    06/29/12 10:30 AM      Result Value Range   Alcohol, Ethyl (B) <11  0 - 11 mg/dL   Comment:            LOWEST DETECTABLE LIMIT FOR     SERUM ALCOHOL IS 11 mg/dL     FOR MEDICAL PURPOSES ONLY  URINE RAPID DRUG SCREEN (HOSP PERFORMED)     Status: None   Collection Time    06/29/12 10:41 AM      Result Value Range   Opiates NONE DETECTED  NONE DETECTED   Cocaine NONE DETECTED  NONE DETECTED   Benzodiazepines NONE DETECTED  NONE DETECTED   Amphetamines NONE DETECTED  NONE DETECTED   Tetrahydrocannabinol NONE DETECTED  NONE DETECTED   Barbiturates NONE DETECTED  NONE DETECTED   Comment:            DRUG SCREEN FOR MEDICAL PURPOSES     ONLY.  IF CONFIRMATION IS NEEDED     FOR ANY PURPOSE, NOTIFY LAB     WITHIN 5 DAYS.                LOWEST DETECTABLE LIMITS     FOR URINE DRUG SCREEN     Drug Class       Cutoff (ng/mL)     Amphetamine      1000     Barbiturate      200     Benzodiazepine   200     Tricyclics       300     Opiates          300     Cocaine          300     THC              50   Psychological Evaluations:  Assessment:   AXIS I:  Bipolar, Depressed, Generalized Anxiety Disorder and Post Traumatic Stress Disorder AXIS II:  Deferred AXIS III:   Past Medical History  Diagnosis Date  . Abscess of Bartholin's gland    . History of pleural effusion   . History of attention deficit disorder   .  History of endometriosis   . RLS (restless legs syndrome)   . Anxiety   . Obesity   . GERD (gastroesophageal reflux disease)   . Malignant neoplasm of bladder, part unspecified   . Renal mass 02/15/2012  . Dysrhythmia     h/o ventricular tachycardia- ablation resolved it  . Hypothyroidism   . Depression     Bipolar disorder  . OSA (obstructive sleep apnea)     uses oral appliance instead of CPAP  . Vertigo   . Chronic kidney disease     kidney cancer- pt states she has elected to not have it treated.  Marland Kitchen PONV (postoperative nausea and vomiting)   . Difficult intubation     told by [facility] that she was hard to intubate 15b yrs ago in [place]- surgery since then no problems  . Adverse effect of general anesthetic     felt paralyzed while receiving anesthesia  . Bipolar 1 disorder   . PTSD (post-traumatic stress disorder)    AXIS IV:  housing problems and other psychosocial or environmental problems AXIS V:  41-50 serious symptoms  Treatment Plan/Recommendations:   1. Admit for crisis management and stabilization. Estimated length of stay 5-7 days. 2. Medication management to reduce current symptoms to base line and improve the patient's level of functioning. Continue Cymbalta. Add Abilify 2 mg po daily starting 07/01/12.  3. Develop treatment plan to decrease risk of relapse upon discharge of depressive symptoms and the need for readmission. 5. Group therapy to facilitate development of healthy coping skills to use for depression and anxiety. 6. Health care follow up as needed for medical problems. Patient is medically stable at present.  7. Discharge plan to include therapy to help patient cope with multiple stressors.  8. Call for Consult with Hospitalist for additional specialty patient services as needed.   Treatment Plan Summary: Daily contact with patient to assess and evaluate symptoms and progress in  treatment Medication management Supportive approach/coping skills/stress management Optimize treatment for her mood/re try Abilify Current Medications:  Current Facility-Administered Medications  Medication Dose Route Frequency Provider Last Rate Last Dose  . DULoxetine (CYMBALTA) DR capsule 60 mg  60 mg Oral Daily Cleotis Nipper, MD   60 mg at 06/30/12 0737  . hydrochlorothiazide (HYDRODIURIL) tablet 25 mg  25 mg Oral Daily Cleotis Nipper, MD   25 mg at 06/30/12 1011   And  . irbesartan (AVAPRO) tablet 300 mg  300 mg Oral Daily Cleotis Nipper, MD   300 mg at 06/30/12 1011  . lamoTRIgine (LAMICTAL) tablet 200 mg  200 mg Oral BID Cleotis Nipper, MD   200 mg at 06/30/12 0737  . LORazepam (ATIVAN) tablet 1 mg  1 mg Oral TID PRN Rachael Fee, MD   1 mg at 06/30/12 1025  . rOPINIRole (REQUIP) tablet 2 mg  2 mg Oral QHS Rachael Fee, MD        Observation Level/Precautions:  15 minute checks  Laboratory:  CBC Chemistry Profile UDS  Psychotherapy:  Individual and Group Therapy  Medications:  To be adjusted  Consultations:  As needed   Discharge Concerns:  Emotional Stability  Estimated LOS: 3-5 days  Other:     I certify that inpatient services furnished can reasonably be expected to improve the patient's condition.   Fransisca Kaufmann ANN NP-C 4/11/20143:49 PM

## 2012-06-30 NOTE — Tx Team (Signed)
Initial Interdisciplinary Treatment Plan  PATIENT STRENGTHS: (choose at least two) Average or above average intelligence Communication skills  PATIENT STRESSORS: Financial difficulties Health problems Marital or family conflict   PROBLEM LIST: Problem List/Patient Goals Date to be addressed Date deferred Reason deferred Estimated date of resolution  Depression 06/30/12                                                      DISCHARGE CRITERIA:  Ability to meet basic life and health needs Adequate post-discharge living arrangements Improved stabilization in mood, thinking, and/or behavior Safe-care adequate arrangements made  PRELIMINARY DISCHARGE PLAN: Attend aftercare/continuing care group Outpatient therapy  PATIENT/FAMIILY INVOLVEMENT: This treatment plan has been presented to and reviewed with the patient, [name], and/or family member.  The patient and family have been given the opportunity to ask questions and make suggestions.  [name] [facility] 06/30/2012, 1:49 AM

## 2012-06-30 NOTE — Progress Notes (Signed)
D) Pt waiting for her Lorazepam 1 mg that was ordered for her. States that she feels anxious. Began to talk about her decision of not seeking treatment concerning her Kidney cancer. Pt states that in the last few years she has been very sad and just doesn't want to fight it anymore. "I would rather die and be with Jesus, than to fight this battle of life anymore". States that the kidney cancer is slow growing, has known for two years and is just choosing to do nothing about it. Did agree with her oncologist that if the pain should get bad she would accept radiation. Affect is sad and mood depressed. Tearful when talking about her trails and tribulations since her divorce. States, "I really have no one". A) Provided with a 1:1 and encouraged Pt to talk about her feelings. Given support and made sure that her medications were as she had requested from Dr. Dub Mikes. Will continue to meet with Pt throughout the day.  R) Pt is sad, depressed and hopeless. Is denying active SI and HI. Rates her depression at an 8 and her hopelessness at a 8-9.

## 2012-06-30 NOTE — Tx Team (Signed)
  Interdisciplinary Treatment Plan Update   Date Reviewed:  06/30/2012  Time Reviewed:  12:26 PM  Progress in Treatment:   Attending groups: Yes Participating in groups: Yes Taking medication as prescribed: Yes  Tolerating medication: Yes Family/Significant other contact made: No Patient understands diagnosis: Yes  As evidenced by asking for help with mood stabilization Discussing patient identified problems/goals with staff: Yes See initial plan Medical problems stabilized or resolved: Yes Denies suicidal/homicidal ideation: Yes  In tx team Patient has not harmed self or others: Yes  For review of initial/current patient goals, please see plan of care.  Estimated Length of Stay:    Reason for Continuation of Hospitalization: Anxiety Depression Medication stabilization  New Problems/Goals identified:  N/A  Discharge Plan or Barriers:   return home, follow up outpt  Additional Comments: Pt is a 67 yr old female invol admited from [place] via [place]. Pt experienced passing out/anxiety episode this morning while at [facility]. Was sent with recommend for med adjustment due to excessive anxiety, decreased ADLs, confusion and decreased concentration. Pt has terminal kidney cancer and has elected to not treat. States she is completely at peace with her decision and has discussed at length with her oncologist. However telepsych felt this was a form of suicidal risk and intent. Pt calm, cooperative, anxious, depressed. Oriented to unit, Level III obs initiated. Provided fluids, food. She continues to deny SI/HI/AVH    Attendees:  Signature: Patrick Eder Macek, MD 06/30/2012 12:26 PM   Signature: Richelle Ito, LCSW 06/30/2012 12:26 PM  Signature:  06/30/2012 12:26 PM  Signature:Chris Sharee Pimple, RN 06/30/2012 12:26 PM  Signature:  06/30/2012 12:26 PM  Signature:  06/30/2012 12:26 PM  Signature:   06/30/2012 12:26 PM  Signature:    Signature:    Signature:    Signature:    Signature:    Signature:       Scribe for Treatment Team:   Richelle Ito, LCSW  06/30/2012 12:26 PM

## 2012-07-01 MED ORDER — TRAZODONE HCL 100 MG PO TABS
100.0000 mg | ORAL_TABLET | Freq: Every evening | ORAL | Status: DC | PRN
  Administered 2012-07-01: 100 mg via ORAL
  Filled 2012-07-01: qty 1

## 2012-07-01 MED ORDER — HYDROXYZINE HCL 50 MG PO TABS
50.0000 mg | ORAL_TABLET | Freq: Every evening | ORAL | Status: DC | PRN
  Administered 2012-07-01: 50 mg via ORAL

## 2012-07-01 MED ORDER — IBUPROFEN 200 MG PO TABS
400.0000 mg | ORAL_TABLET | ORAL | Status: DC | PRN
  Administered 2012-07-01 – 2012-07-05 (×3): 400 mg via ORAL
  Filled 2012-07-01 (×3): qty 2

## 2012-07-01 MED ORDER — LEVOTHYROXINE SODIUM 75 MCG PO TABS
75.0000 ug | ORAL_TABLET | Freq: Every day | ORAL | Status: DC
  Administered 2012-07-02 – 2012-07-05 (×4): 75 ug via ORAL
  Filled 2012-07-01 (×6): qty 1

## 2012-07-01 NOTE — Progress Notes (Signed)
Patient ID: [name], female   DOB: [birth year], 67 y.o.   MRN: [id number] D. The patient's mood and affect are not congruent with what she reports. Full range of mood and affect. Good eye contact. Smiles and laughs easily. Stated that she did not go outside today because she is still depressed and anxious. Stated she spent  most of the day in bed. This evening she was bright and social interacting in the milieu. Was very bright and happy after receiving a phone call from a gentleman friend. Reported that since her divorce three years ago she has only dated a little, but that this man is kind, funny and very intelligent. He is very supportive and they enjoy spending time together. She reports her apartment complex host a lot of parties. He lives in the same apartment building and they met at one of the parties. Unfortunately he has some dementia and can't drive.  A. Met with patient 1:1 to assess. Encouraged to attend evening wrap up group. Reviewed and administered medication. R. Attended and actively participated in evening group. Denied any suicidal ideation. Compliant with medication.

## 2012-07-01 NOTE — Progress Notes (Signed)
Patient ID: [name], female   DOB: 1945-11-09, 67 y.o.   MRN: [id number] D. The patient is pleasant and interacting appropriately in the milieu. Reports that she is spending more time out of her room and attending groups. Easily engages in discussions in milieu. States that she still feels anxious and depressed, but going to groups helps her.  A. Met with patient 1:1. Spent time reviewing medication and administration time schedule. Encouraged to attend group.  R. Attended evening wrap up group. Wrote down medication time schedule. Less anxious this evening.

## 2012-07-01 NOTE — Progress Notes (Signed)
[facility] MD Progress Note  07/01/2012 3:35 PM [name]  MRN:  [id number] Subjective:  [name] reports that she is feeling "less depressed." today. She is rating her anxiety at seven and level of depression at seven. Patient is active on the unit attending groups but states "I could easily go back to bed and just stay there." Patient started on abilify this morning and reports no adverse effects so far. Reports feeling her heart race at times when "walking." She reports having a complete physical in January by PCP and nothing wrong was found with her cardiac status.  Diagnosis:   Axis I: Bipolar, Depressed, Generalized Anxiety Disorder and Post Traumatic Stress Disorder Axis II: Deferred Axis III:  Past Medical History  Diagnosis Date  . Abscess of Bartholin's gland   . History of pleural effusion   . History of attention deficit disorder   . History of endometriosis   . RLS (restless legs syndrome)   . Anxiety   . Obesity   . GERD (gastroesophageal reflux disease)   . Malignant neoplasm of bladder, part unspecified   . Renal mass 02/15/2012  . Dysrhythmia     h/o ventricular tachycardia- ablation resolved it  . Hypothyroidism   . Depression     Bipolar disorder  . OSA (obstructive sleep apnea)     uses oral appliance instead of CPAP  . Vertigo   . Chronic kidney disease     kidney cancer- pt states she has elected to not have it treated.  Marland Kitchen PONV (postoperative nausea and vomiting)   . Difficult intubation     told by [facility] that she was hard to intubate 15b yrs ago in [place]- surgery since then no problems  . Adverse effect of general anesthetic     felt paralyzed while receiving anesthesia  . Bipolar 1 disorder   . PTSD (post-traumatic stress disorder)    Axis IV: economic problems, housing problems and other psychosocial or environmental problems Axis V: 51-60 moderate symptoms  ADL's:  Intact  Sleep: Poor  Appetite:  Fair  Suicidal Ideation:  Denies Homicidal Ideation:   Denies AEB (as evidenced by):  Psychiatric Specialty Exam: Review of Systems  Constitutional: Negative.   HENT: Negative.   Eyes: Negative.   Respiratory: Negative.   Cardiovascular: Positive for palpitations (Experiences occasionally when walking. ).  Gastrointestinal: Negative.   Genitourinary: Negative.   Musculoskeletal: Negative.   Skin: Negative.   Neurological: Negative.   Endo/Heme/Allergies: Negative.   Psychiatric/Behavioral: Positive for depression. Negative for suicidal ideas, hallucinations, memory loss and substance abuse. The patient is nervous/anxious and has insomnia.     Blood pressure 133/86, pulse 100, temperature 97.1 F (36.2 C), temperature source Oral, resp. rate 20, height 5\' 5"  (1.651 m), weight 100.699 kg (222 lb).Body mass index is 36.94 kg/(m^2).  General Appearance: Casual and Neat  Eye Contact::  Good  Speech:  Clear and Coherent  Volume:  Normal  Mood:  Anxious and Depressed  Affect:  Appropriate  Thought Process:  Goal Directed and Intact  Orientation:  Full (Time, Place, and Person)  Thought Content:  WDL  Suicidal Thoughts:  No  Homicidal Thoughts:  No  Memory:  Immediate;   Good Recent;   Good Remote;   Good  Judgement:  Fair  Insight:  Good  Psychomotor Activity:  Normal  Concentration:  Good  Recall:  Good  Akathisia:  No  Handed:  Right  AIMS (if indicated):     Assets:  Communication Skills  Desire for Improvement Resilience Social Support Talents/Skills  Sleep:  Number of Hours: 5.25   Current Medications: Current Facility-Administered Medications  Medication Dose Route Frequency Provider Last Rate Last Dose  . ARIPiprazole (ABILIFY) tablet 2 mg  2 mg Oral Daily Karolee Stamps, NP   2 mg at 07/01/12 9604  . DULoxetine (CYMBALTA) DR capsule 60 mg  60 mg Oral Daily Cleotis Nipper, MD   60 mg at 07/01/12 0832  . hydrochlorothiazide (HYDRODIURIL) tablet 25 mg  25 mg Oral Daily Cleotis Nipper, MD   25 mg at 07/01/12 5409   And   . irbesartan (AVAPRO) tablet 300 mg  300 mg Oral Daily Cleotis Nipper, MD   300 mg at 07/01/12 0834  . lamoTRIgine (LAMICTAL) tablet 200 mg  200 mg Oral BID Cleotis Nipper, MD   200 mg at 07/01/12 0832  . [START ON 07/02/2012] levothyroxine (SYNTHROID, LEVOTHROID) tablet 75 mcg  75 mcg Oral QAC breakfast Karolee Stamps, NP      . LORazepam (ATIVAN) tablet 1 mg  1 mg Oral TID PRN Rachael Fee, MD   1 mg at 07/01/12 1012  . rOPINIRole (REQUIP) tablet 2 mg  2 mg Oral QHS Rachael Fee, MD   2 mg at 06/30/12 2120    Lab Results: No results found for this or any previous visit (from the past 48 hour(s)).  Physical Findings: AIMS: Facial and Oral Movements Muscles of Facial Expression: None, normal Lips and Perioral Area: None, normal Jaw: None, normal Tongue: None, normal,Extremity Movements Upper (arms, wrists, hands, fingers): None, normal Lower (legs, knees, ankles, toes): None, normal, Trunk Movements Neck, shoulders, hips: None, normal, Overall Severity Severity of abnormal movements (highest score from questions above): None, normal Incapacitation due to abnormal movements: None, normal Patient's awareness of abnormal movements (rate only patient's report): No Awareness, Dental Status Current problems with teeth and/or dentures?: No Does patient usually wear dentures?: No  CIWA:    COWS:     Treatment Plan Summary: Daily contact with patient to assess and evaluate symptoms and progress in treatment Medication management  Plan: Continue crisis management and stabilization.  Medication management: Continue current medication regimen and monitor for improvement.  Encouraged patient to attend groups and participate in group counseling sessions and activities. Encourage development of healthy coping skills.  Discharge plan in progress.  Address health issues: Ordered TSH level as patient reports no recent testing related to her hypothyroidism.  Continue current treatment plan.    Medical Decision Making Problem Points:  Established problem, stable/improving (1) and Review of psycho-social stressors (1) Data Points:  Review of medication regiment & side effects (2) Review of new medications or change in dosage (2)  I certify that inpatient services furnished can reasonably be expected to improve the patient's condition.   Fransisca Kaufmann ANN NP-C 07/01/2012, 3:35 PM

## 2012-07-01 NOTE — Progress Notes (Signed)
[facility] Group Notes:  (Nursing/MHT/Case Management/Adjunct)  Date:  07/01/2012  Time:  2000  Type of Therapy:  Psychoeducational Skills  Participation Level:  Active  Participation Quality:  Appropriate  Affect:  Appropriate  Cognitive:  Appropriate  Insight:  Good  Engagement in Group:  Engaged  Modes of Intervention:  Education  Summary of Progress/Problems: The patient described her day as having been "up and down". She went outside for fresh air for the first time. Her goal for tomorrow is to begin working on the list of things that she has to do at home.   [name] 07/01/2012, 9:58 PM

## 2012-07-01 NOTE — [facility] Group Notes (Signed)
[facility] LCSW Group Therapy  07/01/2012 3-4pm  Summary of Progress/Problems:  Summary of Progress/Problems:   The main focus of today's process group was for the patient to identify something in their life related to their hospitalization that they would like to change.  The concept of "normal" was discussed, as well as the concept of "wellness" and what that means to different people.  We also discussed the use of music as a component of wellness.  The patient stated that she has a fear of success, that she feels the pressure is too great and she just falls apart under it.  She also talked about not knowing if she wanted to be "well" because a therapist had asked her in a group (she did not identify where this took place) if she wanted to be well, and the therapist stated that being well involved a lot of things like having a job which the patient has never known how to do.  CSW guided the group to looking at the concepts of "normal" and "wellness" as a result of this patient's expression of fears which seemed to be universal.  Type of Therapy:  Group Therapy  Participation Level:  Active  Participation Quality:  Appropriate, Attentive, Sharing and Supportive  Affect:  Blunted and Depressed  Cognitive:  Appropriate and Oriented  Insight:  Engaged  Engagement in Therapy:  Engaged  Modes of Intervention:  Discussion, Exploration and Problem-solving  [name] 07/01/2012, 4:05 PM

## 2012-07-01 NOTE — Progress Notes (Signed)
Date: 06/30/2012  Time: 1100  Group Topic/Focus:  Relapse Prevention Planning: The focus of this group is to define relapse and discuss the need for planning to combat relapse.  Participation Level: Active  Participation Quality: Appropriate, Sharing and Supportive  Affect: Appropriate  Cognitive: Appropriate  Insight: Appropriate  Engagement in Group: Engaged and Supportive  Modes of Intervention: Education and Support  Additional Comments: pt stated that they started a bucket list. Pt stated that they have kidney cancer. [name] M  07/01/2012, 6:41 PM

## 2012-07-02 LAB — TSH: TSH: 5.271 u[IU]/mL — ABNORMAL HIGH (ref 0.350–4.500)

## 2012-07-02 MED ORDER — TRAZODONE HCL 50 MG PO TABS
50.0000 mg | ORAL_TABLET | Freq: Every evening | ORAL | Status: DC | PRN
Start: 2012-07-02 — End: 2012-07-04
  Administered 2012-07-02 – 2012-07-03 (×2): 50 mg via ORAL
  Filled 2012-07-02 (×2): qty 1

## 2012-07-02 NOTE — [facility] Group Notes (Signed)
[facility] LCSW Group Therapy  07/02/2012 3:15-4:10pm  Summary of Progress/Problems:  The main focus of today's process group was to define "support" and describe what healthy supports are.  We then discussed how and why to increase patient supports, using motivational interviewing.  An activity was done to demonstrate strength in numbers greater than 1, and we discussed that supports can hold [place] accountable to our wellness goals.  Several patients were resistant to this idea and there was a lively discussion.   The patient expressed that she is quite afraid of making herself open to supports, because she is afraid she will get hurt.  Type of Therapy:  Group Therapy  Participation Level:  Active  Participation Quality:  Appropriate, Attentive and Sharing  Affect:  Anxious and Blunted  Cognitive:  Appropriate and Oriented  Insight:  Engaged  Engagement in Therapy:  Engaged  Modes of Intervention:  Discussion and Exploration   [name] 07/02/2012, 5:03 PM

## 2012-07-02 NOTE — Progress Notes (Signed)
[name] is less labile today..seen walking back and forth in the halls...talking to student nurses, she is able to articulate  that she feels less emotional. She makes good eye contact. She has logic to her speech and thought process. She completes her AM self inventory and on it she writes she denies SI within the past 24 hrs, she rates her depression " 9 / 8 " and she states her DC plan is ot cont to work on her list of ways to become healthier.   A SHe requested ativan one time at  1306( for anxiety qnd stated relief after she took it.   R Safety is in place and POC includes fostering therapetuic relationhip

## 2012-07-02 NOTE — Progress Notes (Signed)
[facility] MD Progress Note  07/02/2012 1:54 PM [name]  MRN:  [id number] Subjective: " I feel better this morning not as depressed but the medicine they gave me to sleep has me groggy and I feel my heart beating fast" Diagnosis:  Axis I:major depression,recurrent Generalized anxiety disorde  ADL's:  Intact  Sleep: Fair  Appetite:  Good  Suicidal Ideation:  Plan:  none Homicidal Ideation:  Plan:  none AEB (as evidenced by):  Psychiatric Specialty Exam: Review of Systems  Constitutional: Positive for malaise/fatigue.  HENT: Negative.   Eyes: Negative.   Respiratory: Negative.   Cardiovascular: Positive for palpitations.  Gastrointestinal: Negative.   Genitourinary: Negative.   Musculoskeletal: Negative.   Skin: Negative.   Neurological: Positive for dizziness.  Psychiatric/Behavioral: Positive for depression. The patient is nervous/anxious.     Blood pressure 106/74, pulse 92, temperature 97.8 F (36.6 C), temperature source Oral, resp. rate 20, height 5\' 5"  (1.651 m), weight 222 lb (100.699 kg).Body mass index is 36.94 kg/(m^2).  General Appearance: Well Groomed  Patent attorney::  Good  Speech:  Normal Rate  Volume:  Normal  Mood:  Euthymic  Affect:  Full Range  Thought Process:  Intact  Orientation:  Full (Time, Place, and Person)  Thought Content:  Negative  Suicidal Thoughts:  No  Homicidal Thoughts:  No  Memory:  Negative  Judgement:  Intact  Insight:  Fair  Psychomotor Activity:  Normal  Concentration:  Good  Recall:  Good  Akathisia:  Negative  Handed:  Right  AIMS (if indicated):   negative  Assets:  Resilience  Sleep:  Number of Hours: 5.5   Current Medications: Current Facility-Administered Medications  Medication Dose Route Frequency Provider Last Rate Last Dose  . ARIPiprazole (ABILIFY) tablet 2 mg  2 mg Oral Daily Karolee Stamps, NP   2 mg at 07/02/12 0831  . DULoxetine (CYMBALTA) DR capsule 60 mg  60 mg Oral Daily Cleotis Nipper, MD   60 mg at  07/02/12 0831  . hydrochlorothiazide (HYDRODIURIL) tablet 25 mg  25 mg Oral Daily Cleotis Nipper, MD   25 mg at 07/02/12 0831   And  . irbesartan (AVAPRO) tablet 300 mg  300 mg Oral Daily Cleotis Nipper, MD   300 mg at 07/02/12 0831  . ibuprofen (ADVIL,MOTRIN) tablet 400 mg  400 mg Oral Q4H PRN Rachael Fee, MD   400 mg at 07/01/12 2345  . lamoTRIgine (LAMICTAL) tablet 200 mg  200 mg Oral BID Cleotis Nipper, MD   200 mg at 07/02/12 0831  . levothyroxine (SYNTHROID, LEVOTHROID) tablet 75 mcg  75 mcg Oral QAC breakfast Karolee Stamps, NP   75 mcg at 07/02/12 0831  . LORazepam (ATIVAN) tablet 1 mg  1 mg Oral TID PRN Rachael Fee, MD   1 mg at 07/02/12 1306  . rOPINIRole (REQUIP) tablet 2 mg  2 mg Oral QHS Rachael Fee, MD   2 mg at 07/01/12 2134  . traZODone (DESYREL) tablet 50 mg  50 mg Oral QHS PRN Court Joy, PA-C        Lab Results:  Results for orders placed during the hospital encounter of 06/29/12 (from the past 48 hour(s))  TSH     Status: Abnormal   Collection Time    07/01/12  7:31 PM      Result Value Range   TSH 5.271 (*) 0.350 - 4.500 uIU/mL    Physical Findings: AIMS: Facial and Oral  Movements Muscles of Facial Expression: None, normal Lips and Perioral Area: None, normal Jaw: None, normal Tongue: None, normal,Extremity Movements Upper (arms, wrists, hands, fingers): None, normal Lower (legs, knees, ankles, toes): None, normal, Trunk Movements Neck, shoulders, hips: None, normal, Overall Severity Severity of abnormal movements (highest score from questions above): None, normal Incapacitation due to abnormal movements: None, normal Patient's awareness of abnormal movements (rate only patient's report): No Awareness, Dental Status Current problems with teeth and/or dentures?: No Does patient usually wear dentures?: No  CIWA:    COWS:     Treatment Plan Summary: Daily contact with patient to assess and evaluate symptoms and progress in  treatment  Plan:  Medical Decision Making Problem Points:  Established problem, stable/improving (1) Data Points:  Review of medication regiment & side effects (2)  I certify that inpatient services furnished can reasonably be expected to improve the patient's condition.   Court Joy 07/02/2012, 1:54 PM

## 2012-07-03 DIAGNOSIS — F332 Major depressive disorder, recurrent severe without psychotic features: Principal | ICD-10-CM

## 2012-07-03 DIAGNOSIS — F411 Generalized anxiety disorder: Secondary | ICD-10-CM

## 2012-07-03 NOTE — Progress Notes (Signed)
D.  Pt. Has flat affect, anxious mood.  Denies SI/HI and denies A/V hallucinations and contracts for safety.  Reports feeling better than she did yesterday.  Reports that she has learned a lot on this admission that is helping. A.  Encouragement and support given.  PRN Ativan given for anxiety.  Will reassess. R.  Pt. Receptive and remains safe.

## 2012-07-03 NOTE — [facility] Group Notes (Signed)
[facility] LCSW Group Therapy        Overcoming Obstacles 1:15 2:30 PM         07/03/2012 3:48 PM  Type of Therapy:  Group Therapy  Participation Level:  Active  Participation Quality:  Appropriate and Attentive  Affect:  Anxious and Appropriate  Cognitive:  Alert and Appropriate  Insight:  Developing/Improving and Engaged  Engagement in Therapy:  Developing/Improving and Engaged  Modes of Intervention:  Discussion, Education, Exploration, Problem-solving, Rapport Building and Support  Summary of Progress/Problems:  Patient shared the obstacle she needs to overcome is not being able to identify triggers and signs that she is not doing well.  She stated she plans to ask friend to let her know when they observe changes that would indicate she is not doing well.  Wynn Banker 07/03/2012, 3:48 PM

## 2012-07-03 NOTE — Progress Notes (Signed)
Patient ID: [name], female   DOB: 1945-07-15, 67 y.o.   MRN: [id number] D. The patient reports feeling less depressed but more anxious. She is able to interact appropriately in milieu and appears to be enjoying herself laughing and talking with others. Stated that her day started off poorly feeling sluggish and depressed, but as the day progressed her mood lift. A. Met with patient 1:1. Discussed coping strategies when feeling anxious. Reviewed HS medications. R. Attended and actively participated in evening group. Stated that she worked with a Probation officer a list of pleasurable activities she enjoys doing to relieve anxiety. Reports that just writing the list helped to relieve her anxiety.

## 2012-07-03 NOTE — Progress Notes (Signed)
Patient ID: [name], female   DOB: Feb 05, 1946, 67 y.o.   MRN: [id number] She has been up and about interacting with peers and staff. She denies thoughts of SI.  Self inventory was not done today. She did c/o anxiety this AM ans ativan prn given and was effective.

## 2012-07-03 NOTE — Progress Notes (Signed)
Recreation Therapy Notes  Date: 04.14.2014  Time: 3:00pm Location: [facility]     Group Topic/Focus: Self Expression  Participation Level: Active  Participation Quality: Appropriate  Affect: Euthymic  Cognitive: Appropriate   Additional Comments: Activity: Who Am I worksheet & The Pieces of Me worksheet. Explanation: Patients were given a Who Am I worksheet. Worksheet had a circle divided into 6 equal parts. Patients were asked to identify the characteristics that are most prominent within them. Patients were then given The Pieces of Me worksheet. Worksheet has a circle divided into 10 equal parts. Patient were asked to define themselves using the following categories: Mind, Body, Spirituality, Alone, At Home, With Family, With Friends, At Work, At [employer]. Once part was left open for patients to define their own category.   Patient actively participated on group activity. Patient filled out each category on both worksheets. Patient participated in opening discussion about wellness and how wellness can effect you. Patient listened to, but did not participate in group discussion about forgiveness.   Marykay Lex Roderic Lammert, LRT/CTRS  Jearl Klinefelter 07/03/2012 4:16 PM

## 2012-07-03 NOTE — Progress Notes (Signed)
[facility] Group Notes:  (Nursing/MHT/Case Management/Adjunct)  Date:  07/02/2012 Time:  2000  Type of Therapy:  Psychoeducational Skills  Participation Level:  Active  Participation Quality:  Appropriate  Affect:  Appropriate  Cognitive:  Appropriate  Insight:  Good  Engagement in Group:  Engaged  Modes of Intervention:  Education  Summary of Progress/Problems: The patient expressed in group this evening that her depression had decreased, while her anxiety increased. She stated that she had one particular panic attack. Her goal for tomorrow is to "walk more".   [name] S 07/03/2012, 12:11 AM

## 2012-07-03 NOTE — [facility] Group Notes (Signed)
[facility] LCSW Aftercare Discharge Planning Group Note   07/03/2012 3:46 PM  Participation Quality:  Appropriate  Mood/Affect:  Anxious and Appropriate  Depression Rating:  5  Anxiety Rating:  8  Thoughts of Suicide:  No  Will you contract for safety?   NA  Current AVH:  No  Plan for Discharge/Comments:  Patient reports being a little better but still very anxious.  She advised she and case manager are working on discharge plans.  Transportation Means: Patient has transportation  Supports: Limited support system.  Camarie Mctigue, Joesph July

## 2012-07-03 NOTE — Progress Notes (Signed)
Adult Psychoeducational Group Note  Date:  07/03/2012 Time:  1:47 PM  Group Topic/Focus:  Wellness Toolbox:   The focus of this group is to discuss various aspects of wellness, balancing those aspects and exploring ways to increase the ability to experience wellness.  Patients will create a wellness toolbox for use upon discharge.  Participation Level:  Active  Participation Quality:  Appropriate, Attentive and Sharing  Affect:  Appropriate  Cognitive:  Alert  Insight: Appropriate  Engagement in Group:  Engaged  Modes of Intervention:  Discussion  Additional Comments:  Pt was appropriate and attentive while attending group. Pt stated that she plans to ask for help to fix something's around her house. She also plans to eat less and lose weight.   Sharyn Lull 07/03/2012, 1:47 PM
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
## 2012-07-03 NOTE — Progress Notes (Signed)
Adult Psychoeducational Group Note  Date:  07/03/2012 Time:  9:46 PM  Group Topic/Focus:  Identifying Needs:   The focus of this group is to help patients identify their personal needs that have been historically problematic and identify healthy behaviors to address their needs.  Participation Level:  Active  Participation Quality:  Supportive  Affect:  Appropriate  Cognitive:  Appropriate  Insight: Appropriate  Engagement in Group:  Supportive  Modes of Intervention:  Problem-solving  Additional Comments:  Ms. Kaloni was able to relate to some changes that she felt that needs to be done in her life.  She was also able to identify the different changes that she has been working on since she has been in the hospital.    Annell Greening Spangle 07/03/2012, 9:46 PM

## 2012-07-04 MED ORDER — TRAZODONE HCL 50 MG PO TABS
75.0000 mg | ORAL_TABLET | Freq: Every evening | ORAL | Status: DC | PRN
  Administered 2012-07-04: 75 mg via ORAL
  Filled 2012-07-04: qty 2

## 2012-07-04 NOTE — [facility] Suicide Risk Assessment (Signed)
[facility] INPATIENT:  Family/Significant Other Suicide Prevention Education  Suicide Prevention Education:  Education Completed; No one has been identified by the patient as the family member/significant other with whom the patient will be residing, and identified as the person(s) who will aid the patient in the event of a mental health crisis (suicidal ideations/suicide attempt).  With written consent from the patient, the family member/significant other has been provided the following suicide prevention education, prior to the and/or following the discharge of the patient.  The suicide prevention education provided includes the following:  Suicide risk factors  Suicide prevention and interventions  National Suicide Hotline telephone number  [facility] assessment telephone number  [facility] Emergency Assistance 911  [facility] and/or Residential Mobile Crisis Unit telephone number  Request made of family/significant other to:  Remove weapons (e.g., guns, rifles, knives), all items previously/currently identified as safety concern.    Remove drugs/medications (over-the-counter, prescriptions, illicit drugs), all items previously/currently identified as a safety concern.  The family member/significant other verbalizes understanding of the suicide prevention education information provided.  The family member/significant other agrees to remove the items of safety concern listed above.  Jaylon denied SI at the time of admission, and continued to deny SI during her stay here.  No SPE required.  Daryel Gerald B 07/04/2012, 5:54 PM

## 2012-07-04 NOTE — Progress Notes (Signed)
Patient ID: [name], female   DOB: Feb 08, 1946, 67 y.o.   MRN: [id number] D: Patient lying in bed with eyes closed. Respirations even and non-labored.  A: Staff will monitor on q 15 minute checks, follow treatment plan, and give meds as ordered. R: Appears to be sleeping at this time.

## 2012-07-04 NOTE — Progress Notes (Signed)
Adult Psychoeducational Group Note  Date:  07/04/2012 Time:  6:19 PM  Group Topic/Focus:  Recovery Goals:   The focus of this group is to identify appropriate goals for recovery and establish a plan to achieve them.  Participation Level:  Active  Participation Quality:  Appropriate and Attentive  Affect:  Appropriate  Cognitive:  Alert and Appropriate  Insight: Appropriate  Engagement in Group:  Engaged  Modes of Intervention:  Discussion  Additional Comments:  Pt was appropriate and sharing while attending group. Pt shared two things that stand between her and recovery.  Sharyn Lull 07/04/2012, 6:19 PM

## 2012-07-04 NOTE — Progress Notes (Signed)
  D) Patient pleasant and cooperative upon my assessment. Patient verbalizes "I have been feeling a lot better today."  Patient denies SI/HI, denies A/V hallucinations.   A) Patient offered support and encouragement, patient encouraged to discuss feelings/concerns with staff. Patient verbalized understanding. Patient monitored Q15 minutes for safety. Patient met with MD  to discuss today's goals and plan of care.  R) Patient visible in milieu, attending groups in day room and meals in dining room. Patient appropriate with staff and peers.   Patient taking medications as ordered. Will continue to monitor.

## 2012-07-04 NOTE — Progress Notes (Signed)
Patient ID: [name], female   DOB: 08/11/1945, 67 y.o.   MRN: [id number] [facility] MD Progress Note  07/04/2012 4:36 PM KAIJAH ABTS  MRN:  [id number] Subjective:  [name] is feeling less depressed rating it as a three today. Her main complaint is not sleeping well at night. The patient hopes that if she sleeps good tonight will be ready to d/c tomorrow. Patient feels that she has made progress and is "pushing myself to stay active and fight the urge to sleep too much."  Objective: The patient is observed attending groups on the unit and interacting with peers.   Diagnosis:   Axis I: Bipolar, Depressed, Generalized Anxiety Disorder and Post Traumatic Stress Disorder Axis II: Deferred Axis III:  Past Medical History  Diagnosis Date  . Abscess of Bartholin's gland   . History of pleural effusion   . History of attention deficit disorder   . History of endometriosis   . RLS (restless legs syndrome)   . Anxiety   . Obesity   . GERD (gastroesophageal reflux disease)   . Malignant neoplasm of bladder, part unspecified   . Renal mass 02/15/2012  . Dysrhythmia     h/o ventricular tachycardia- ablation resolved it  . Hypothyroidism   . Depression     Bipolar disorder  . OSA (obstructive sleep apnea)     uses oral appliance instead of CPAP  . Vertigo   . Chronic kidney disease     kidney cancer- pt states she has elected to not have it treated.  Marland Kitchen PONV (postoperative nausea and vomiting)   . Difficult intubation     told by [facility] that she was hard to intubate 15b yrs ago in [place]- surgery since then no problems  . Adverse effect of general anesthetic     felt paralyzed while receiving anesthesia  . Bipolar 1 disorder   . PTSD (post-traumatic stress disorder)    Axis IV: economic problems, housing problems and other psychosocial or environmental problems Axis V: 51-60 moderate symptoms  ADL's:  Intact  Sleep: Poor  Appetite:  Fair  Suicidal Ideation:  Denies Homicidal  Ideation:  Denies AEB (as evidenced by):  Psychiatric Specialty Exam: Review of Systems  Constitutional: Negative.   HENT: Negative.   Eyes: Negative.   Respiratory: Negative.   Cardiovascular: Negative for palpitations (Experiences occasionally when walking. ).  Gastrointestinal: Negative.   Genitourinary: Negative.   Musculoskeletal: Negative.   Skin: Negative.   Neurological: Negative.   Endo/Heme/Allergies: Negative.   Psychiatric/Behavioral: Positive for depression. Negative for suicidal ideas, hallucinations, memory loss and substance abuse. The patient is nervous/anxious and has insomnia.     Blood pressure 145/93, pulse 89, temperature 98.1 F (36.7 C), temperature source Oral, resp. rate 18, height 5\' 5"  (1.651 m), weight 100.699 kg (222 lb).Body mass index is 36.94 kg/(m^2).  General Appearance: Casual and Neat  Eye Contact::  Good  Speech:  Clear and Coherent  Volume:  Normal  Mood:  Anxious and Depressed  Affect:  Appropriate  Thought Process:  Goal Directed and Intact  Orientation:  Full (Time, Place, and Person)  Thought Content:  WDL  Suicidal Thoughts:  No  Homicidal Thoughts:  No  Memory:  Immediate;   Good Recent;   Good Remote;   Good  Judgement:  Fair  Insight:  Good  Psychomotor Activity:  Normal  Concentration:  Good  Recall:  Good  Akathisia:  No  Handed:  Right  AIMS (if indicated):  Assets:  Communication Skills Desire for Improvement Resilience Social Support Talents/Skills  Sleep:  Number of Hours: 5.5   Current Medications: Current Facility-Administered Medications  Medication Dose Route Frequency Provider Last Rate Last Dose  . ARIPiprazole (ABILIFY) tablet 2 mg  2 mg Oral Daily Karolee Stamps, NP   2 mg at 07/04/12 0743  . DULoxetine (CYMBALTA) DR capsule 60 mg  60 mg Oral Daily Cleotis Nipper, MD   60 mg at 07/04/12 0851  . hydrochlorothiazide (HYDRODIURIL) tablet 25 mg  25 mg Oral Daily Cleotis Nipper, MD   25 mg at 07/04/12  0743   And  . irbesartan (AVAPRO) tablet 300 mg  300 mg Oral Daily Cleotis Nipper, MD   300 mg at 07/04/12 0743  . ibuprofen (ADVIL,MOTRIN) tablet 400 mg  400 mg Oral Q4H PRN Rachael Fee, MD   400 mg at 07/02/12 2001  . lamoTRIgine (LAMICTAL) tablet 200 mg  200 mg Oral BID Cleotis Nipper, MD   200 mg at 07/04/12 1602  . levothyroxine (SYNTHROID, LEVOTHROID) tablet 75 mcg  75 mcg Oral QAC breakfast Karolee Stamps, NP   75 mcg at 07/04/12 0743  . LORazepam (ATIVAN) tablet 1 mg  1 mg Oral TID PRN Rachael Fee, MD   1 mg at 07/04/12 1601  . rOPINIRole (REQUIP) tablet 2 mg  2 mg Oral QHS Rachael Fee, MD   2 mg at 07/03/12 2158  . traZODone (DESYREL) tablet 75 mg  75 mg Oral QHS PRN Karolee Stamps, NP        Lab Results: No results found for this or any previous visit (from the past 48 hour(s)).  Physical Findings: AIMS: Facial and Oral Movements Muscles of Facial Expression: None, normal Lips and Perioral Area: None, normal Jaw: None, normal Tongue: None, normal,Extremity Movements Upper (arms, wrists, hands, fingers): None, normal Lower (legs, knees, ankles, toes): None, normal, Trunk Movements Neck, shoulders, hips: None, normal, Overall Severity Severity of abnormal movements (highest score from questions above): None, normal Incapacitation due to abnormal movements: None, normal Patient's awareness of abnormal movements (rate only patient's report): No Awareness, Dental Status Current problems with teeth and/or dentures?: No Does patient usually wear dentures?: No  CIWA:    COWS:     Treatment Plan Summary: Daily contact with patient to assess and evaluate symptoms and progress in treatment Medication management  Plan: Continue crisis management and stabilization.  Medication management: Continue current medication regimen and monitor for improvement. Increased Trazodone to 75 mg at hs to improve quality of sleep.  Encouraged patient to attend groups and participate in group  counseling sessions and activities. Encourage development of healthy coping skills.  Discharge plan in progress.  Address health issues: Vitals reviewed and stable Continue current treatment plan.   Medical Decision Making Problem Points:  Established problem, stable/improving (1) and Review of psycho-social stressors (1) Data Points:  Review of medication regimen and side effects.   I certify that inpatient services furnished can reasonably be expected to improve the patient's condition.   Fransisca Kaufmann ANN NP-C 07/04/2012, 4:36 PM

## 2012-07-04 NOTE — Progress Notes (Signed)
Date: 07/04/2012  Time: 9:15 PM  Group Topic/Focus:  Wrap-Up Group: The focus of this group is to help patients review their daily goal of treatment and discuss progress on daily workbooks.  Participation Level: Active  Participation Quality: Appropriate, Sharing and Supportive  Affect: Appropriate  Cognitive: Appropriate  Insight: Appropriate  Engagement in Group: Engaged and Supportive  Modes of Intervention: Education, Problem-solving and Support  Additional Comments: none  Tresha Muzio M  07/04/2012, 9:15 PM  
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
## 2012-07-04 NOTE — [facility] Group Notes (Signed)
[facility] LCSW Group Therapy      Feelings About Diagnosis 1:15 - 2:30 PM          07/04/2012 3:07 PM  Type of Therapy:  Group Therapy  Participation Level:  Active  Participation Quality:  Appropriate and Attentive  Affect:  Appropriate  Cognitive:  Alert and Appropriate  Insight:  Engaged  Engagement in Therapy:  Engaged  Modes of Intervention:  Discussion, Education, Exploration, Problem-Solving, Rapport, Support   Summary of Progress/Problems:  Patient shared she sometimes feels like damaged goods as a result of having a diagnosis of Bipolar.  She shared she has learned to monitor what she can do and have  Friends keep and eye on her as well.  Wynn Banker 07/04/2012, 3:07 PM

## 2012-07-05 DIAGNOSIS — F339 Major depressive disorder, recurrent, unspecified: Secondary | ICD-10-CM

## 2012-07-05 DIAGNOSIS — F411 Generalized anxiety disorder: Secondary | ICD-10-CM

## 2012-07-05 MED ORDER — LAMOTRIGINE 200 MG PO TABS: 200.0000 mg | ORAL_TABLET | Freq: Two times a day (BID) | ORAL | Status: DC

## 2012-07-05 MED ORDER — LORAZEPAM 1 MG PO TABS: 1.0000 mg | ORAL_TABLET | Freq: Three times a day (TID) | ORAL | Status: DC | PRN

## 2012-07-05 MED ORDER — VALSARTAN-HYDROCHLOROTHIAZIDE 320-25 MG PO TABS: 1.0000 | ORAL_TABLET | Freq: Every day | ORAL | Status: DC

## 2012-07-05 MED ORDER — ARIPIPRAZOLE 2 MG PO TABS: 2.0000 mg | ORAL_TABLET | Freq: Every day | ORAL | Status: DC

## 2012-07-05 MED ORDER — LEVOTHYROXINE SODIUM 75 MCG PO TABS: 75.0000 ug | ORAL_TABLET | Freq: Every day | ORAL | Status: DC

## 2012-07-05 MED ORDER — VITAMIN D 50 MCG (2000 UT) PO CAPS: 1.0000 | ORAL_CAPSULE | Freq: Every day | ORAL | Status: DC

## 2012-07-05 MED ORDER — MAGNESIUM GLUCONATE 500 MG PO TABS: 500.0000 mg | ORAL_TABLET | Freq: Every day | ORAL | Status: DC

## 2012-07-05 MED ORDER — TRAZODONE 25 MG HALF TABLET: 75.0000 mg | ORAL_TABLET | Freq: Every evening | ORAL | Status: DC | PRN

## 2012-07-05 MED ORDER — DULOXETINE HCL 30 MG PO CPEP: 60.0000 mg | ORAL_CAPSULE | Freq: Every day | ORAL | Status: DC

## 2012-07-05 NOTE — Progress Notes (Signed)
D: Patient pleasant and cooperative with staff and peers. Patient's affect/mood is anxious. She reported on the self inventory sheet that her appetite is good, energy level is normal and ability to pay attention is improving. Patient rated depression "3" and feelings of hopelessness "6". She's to d/c today.  A: Support and encouragement provided to patient. Scheduled medications administered per MD orders. Maintain Q15 minute checks for safety.  R: Patient receptive. Denies SI/HI/AVH. Patient remains safe.

## 2012-07-05 NOTE — Progress Notes (Signed)
Adult Psychoeducational Group Note  Date:  07/05/2012 Time:  12:13 PM  Group Topic/Focus:  Stages of Change:   The focus of this group is to explain the stages of change and help patients identify changes they want to make upon discharge.  Participation Level:  Active  Participation Quality:  Appropriate and Attentive  Affect:  Appropriate and flat  Cognitive:  Alert and Oriented  Insight: Appropriate  Engagement in Group:  Engaged  Modes of Intervention:  Activity, Discussion, Education and Support  Additional Comments:  Pt  Said she is here to work on her anxiety.   Meher Kucinski T 07/05/2012, 12:13 PM

## 2012-07-05 NOTE — Progress Notes (Signed)
Discharge Note: Discharge instructions & prescriptions given to patient. Patient verbalized understanding of discharge instructions and prescriptions. Returned belongings to patient. Denies SI/HI/AVH. Patient d/c without incident to the front lobby. 

## 2012-07-05 NOTE — [facility] Suicide Risk Assessment (Signed)
Suicide Risk Assessment  Discharge Assessment     Demographic Factors:  Female, caucasian  Mental Status Per Nursing Assessment::   On Admission:   (overwhelmed, not caring for self)  Current Mental Status by Physician: Patient alert and oriented to 4. Denies AH/VH/SI/HI.  Loss Factors: Decline in physical health  Historical Factors: Impulsivity  Risk Reduction Factors:   Positive social support and Positive coping skills or problem solving skills  Continued Clinical Symptoms:  Depression:   Recent sense of peace/wellbeing  Cognitive Features That Contribute To Risk:  Cognitively intact  Suicide Risk:  Minimal: No identifiable suicidal ideation.  Patients presenting with no risk factors but with morbid ruminations; may be classified as minimal risk based on the severity of the depressive symptoms  Discharge Diagnoses:   AXIS I:  Major Depression, Recurrent severe AXIS II:  Deferred AXIS III:   Past Medical History  Diagnosis Date  . Abscess of Bartholin's gland   . History of pleural effusion   . History of attention deficit disorder   . History of endometriosis   . RLS (restless legs syndrome)   . Anxiety   . Obesity   . GERD (gastroesophageal reflux disease)   . Malignant neoplasm of bladder, part unspecified   . Renal mass 02/15/2012  . Dysrhythmia     h/o ventricular tachycardia- ablation resolved it  . Hypothyroidism   . Depression     Bipolar disorder  . OSA (obstructive sleep apnea)     uses oral appliance instead of CPAP  . Vertigo   . Chronic kidney disease     kidney cancer- pt states she has elected to not have it treated.  Marland Kitchen PONV (postoperative nausea and vomiting)   . Difficult intubation     told by [facility] that she was hard to intubate 15b yrs ago in [place]- surgery since then no problems  . Adverse effect of general anesthetic     felt paralyzed while receiving anesthesia  . Bipolar 1 disorder   . PTSD (post-traumatic stress disorder)    AXIS  IV:  other psychosocial or environmental problems AXIS V:  61-70 mild symptoms  Plan Of Care/Follow-up recommendations:  Activity:  as tolerated Diet:  low salt diet Follow up with outpatient appointment.  Is patient on multiple antipsychotic therapies at discharge:  No   Has Patient had three or more failed trials of antipsychotic monotherapy by history:  No  Recommended Plan for Multiple Antipsychotic Therapies: NA  [name] 07/05/2012, 9:54 AM

## 2012-07-05 NOTE — Tx Team (Signed)
  Interdisciplinary Treatment Plan Update   Date Reviewed:  07/05/2012  Time Reviewed:  10:33 AM  Progress in Treatment:   Attending groups: Yes Participating in groups: Yes Taking medication as prescribed: Yes  Tolerating medication: Yes Family/Significant other contact made: No Patient understands diagnosis: Yes  As evidenced by asking for help with mood stabilization Discussing patient identified problems/goals with staff: Yes See initial plan Medical problems stabilized or resolved: Yes Denies suicidal/homicidal ideation: Yes  In tx team Patient has not harmed self or others: Yes  For review of initial/current patient goals, please see plan of care.  Estimated Length of Stay:  D/C today  Reason for Continuation of Hospitalization:   New Problems/Goals identified:  N/A  Discharge Plan or Barriers:   return home, follow up outpt  Additional Comments:     Attendees:  Signature: Patrick Demitrious Mccannon, MD 07/05/2012 10:33 AM   Signature: Richelle Ito, LCSW 07/05/2012 10:33 AM  Signature:  07/05/2012 10:33 AM  Signature 07/05/2012 10:33 AM  Signature:  07/05/2012 10:33 AM  Signature:  07/05/2012 10:33 AM  Signature:   07/05/2012 10:33 AM  Signature:    Signature:    Signature:    Signature:    Signature:    Signature:      Scribe for Treatment Team:   Richelle Ito, LCSW  07/05/2012 10:33 AM

## 2012-07-05 NOTE — Discharge Summary (Signed)
Physician Discharge Summary Note  Patient:  [name] is an 67 y.o., female MRN:  [id number] DOB:  04-Mar-1946 Patient phone:  [phone] (home)  Patient address:   [place] [address] [place] [address],   Date of Admission:  06/29/2012 Date of Discharge: 07/05/2012  Reason for Admission:  Depression with loss of functioning, anxiety  Discharge Diagnoses: Active Problems:   Generalized anxiety disorder   Major depression, recurrent  Review of Systems  Constitutional: Negative.   HENT: Negative.   Eyes: Negative.   Respiratory: Negative.   Cardiovascular: Negative.   Gastrointestinal: Negative.   Genitourinary: Negative.   Musculoskeletal: Negative.   Skin: Negative.   Neurological: Negative.   Endo/Heme/Allergies: Negative.   Psychiatric/Behavioral: Positive for depression. The patient is nervous/anxious.    Axis Diagnosis:   AXIS I:  Generalized Anxiety Disorder and Major Depression, Recurrent severe AXIS II:  Deferred AXIS III:   Past Medical History  Diagnosis Date  . Abscess of Bartholin's gland   . History of pleural effusion   . History of attention deficit disorder   . History of endometriosis   . RLS (restless legs syndrome)   . Anxiety   . Obesity   . GERD (gastroesophageal reflux disease)   . Malignant neoplasm of bladder, part unspecified   . Renal mass 02/15/2012  . Dysrhythmia     h/o ventricular tachycardia- ablation resolved it  . Hypothyroidism   . Depression     Bipolar disorder  . OSA (obstructive sleep apnea)     uses oral appliance instead of CPAP  . Vertigo   . Chronic kidney disease     kidney cancer- pt states she has elected to not have it treated.  Marland Kitchen PONV (postoperative nausea and vomiting)   . Difficult intubation     told by [facility] that she was hard to intubate 15b yrs ago in [place]- surgery since then no problems  . Adverse effect of general anesthetic     felt paralyzed while receiving anesthesia  . Bipolar 1  disorder   . PTSD (post-traumatic stress disorder)    AXIS IV:  economic problems, other psychosocial or environmental problems, problems related to social environment and problems with primary support group AXIS V:  61-70 mild symptoms  Level of Care:  OP  Hospital Course:  On admission:  Patient with history of depression, anxiety, PTSD, and Bipolar Disorder NOS. She reports increased depression triggered by situational issues. She explains that she is lonely, has no primary support, daughter is in jail and their relationship is estranged; no money to purchased basic items for her basic needs; car doesn't work well; apartment is dirty and cluttered as she unable to keep her apartment clean; back yard is filled with trash stating she has no energy to walk to [employer]; spouse left and divorced her 3 yrs ago; patient has difficulty getting to doctors appointments including her psychiatrist at [facility] because she forgets dates. Patient reports feeling so overwhelmed and anxious about her stressors she went to the ER 2-3 weeks ago thinking she was having a heart attack. Patient sts that she was medically cleared and told she was having anxiety. Patient was instructed to follow up with her psychiatrist at [facility]. Sts that she made an appointment approximately after the incident to discuss her anxiety and possible medication changes. Patient missed her appointment stating she confused her dates. She presented to [facility] today as a walk-in and sts she became anxious; "slightly  passed out".   Today patient complains of feeling very depressed and anxious. Patient has been feeling anxious for weeks reporting "I stay in the bed and can hardly do anything. Three weeks ago I began to have more anxiety symptoms. I am dating a man I love but he has MS, I had to tell him that I didn't think it could work out. I think maybe it had a bigger impact than I realized." Patient relates that her kidney cancer is "not the  biggest stressor I have. I have always lived a good life financial but the last divorce broke me. I am now trying to live off $799 per month. I'm not suicidal but I'm tired of struggling". The patient feels like her Cymbalta is no longer helpful stating "I've been on it for five years." Her depression is rated at six and anxiety at seven. Patient reports feeling high anxiety and experiences palpitations at times stating "My friends took me to the ED thinking something was wrong with my heart, but I think it's my anxiety being so bad." The patient would like her medications to be changed.   During hospitalization:  Her medications were managed--Cymbalta 60 mg for depression continued, Abilify 2 mg daily added for depression, Lamictal 300 mg daily increased to 200 mg BID to improve her mood, Trazodone 75 mg added for sleep issues, her other medications were continued except her Percocet and tramadol.  Shenise attended and participated in group therapy.  She will use her coping skills of walking her dog, interacting with friends, and using her list of things she needs to do at home to help her stay focused and not to feel overwhelmed--cross out the tasks as she accomplishes them.  Patient denied suicidal/homicidal ideations and auditory/visual hallucinations, follow-up appointments encouraged to attend, Rx given.  Jeneva mentally and physically stable.  Consults:  None  Significant Diagnostic Studies:  labs: Completed and reviewed, stable  Discharge Vitals:   Blood pressure 133/82, pulse 88, temperature 97.4 F (36.3 C), temperature source Oral, resp. rate 18, height 5\' 5"  (1.651 m), weight 100.699 kg (222 lb). Body mass index is 36.94 kg/(m^2). Lab Results:   No results found for this or any previous visit (from the past 72 hour(s)).  Physical Findings: AIMS: Facial and Oral Movements Muscles of Facial Expression: None, normal Lips and Perioral Area: None, normal Jaw: None, normal Tongue: None,  normal,Extremity Movements Upper (arms, wrists, hands, fingers): None, normal Lower (legs, knees, ankles, toes): None, normal, Trunk Movements Neck, shoulders, hips: None, normal, Overall Severity Severity of abnormal movements (highest score from questions above): None, normal Incapacitation due to abnormal movements: None, normal Patient's awareness of abnormal movements (rate only patient's report): No Awareness, Dental Status Current problems with teeth and/or dentures?: No Does patient usually wear dentures?: No  CIWA:    COWS:     Psychiatric Specialty Exam: See Psychiatric Specialty Exam and Suicide Risk Assessment completed by Attending Physician prior to discharge.  Discharge destination:  Home  Is patient on multiple antipsychotic therapies at discharge:  No   Has Patient had three or more failed trials of antipsychotic monotherapy by history:  No Recommended Plan for Multiple Antipsychotic Therapies:  N/A  Discharge Orders   Future Appointments Provider Department Dept Phone   07/21/2012 2:30 PM Krista [facility] [phone]   07/21/2012 3:00 PM Benjiman Core, MD [facility] MEDICAL ONCOLOGY [phone]   08/31/2012 10:00 AM Barbaraann Share, MD  [facility] [phone]   01/10/2013 9:15 AM Barbaraann Share, MD [facility] [phone]   Future Orders Complete By Expires     Activity as tolerated - No restrictions  As directed     Diet - low sodium heart healthy  As directed         Medication List    STOP taking these medications       oxyCODONE-acetaminophen 5-325 MG per tablet  Commonly known as:  PERCOCET/ROXICET     traMADol 50 MG tablet  Commonly known as:  ULTRAM      TAKE these medications     Indication   ARIPiprazole 2 MG tablet  Commonly known as:  ABILIFY  Take 1 tablet (2 mg total) by mouth daily.   Indication:  Major Depressive Disorder     DULoxetine 30 MG capsule   Commonly known as:  CYMBALTA  Take 2 capsules (60 mg total) by mouth daily.   Indication:  Major Depressive Disorder     lamoTRIgine 200 MG tablet  Commonly known as:  LAMICTAL  Take 1 tablet (200 mg total) by mouth 2 (two) times daily.   Indication:  Depression     levothyroxine 75 MCG tablet  Commonly known as:  SYNTHROID, LEVOTHROID  Take 1 tablet (75 mcg total) by mouth daily.   Indication:  Underactive Thyroid     LORazepam 1 MG tablet  Commonly known as:  ATIVAN  Take 1 mg by mouth every 8 (eight) hours as needed. For anxiety      promethazine 25 MG tablet  Commonly known as:  PHENERGAN  Take 25 mg by mouth every 6 (six) hours as needed for nausea.      rOPINIRole 0.5 MG tablet  Commonly known as:  REQUIP  Take 0.5-1 mg by mouth 2 (two) times daily as needed. Takes during the day if needed for restless leg syndrome      traZODone 25 mg Tabs  Commonly known as:  DESYREL  Take 1.5 tablets (75 mg total) by mouth at bedtime as needed for sleep.   Indication:  Trouble Sleeping     valsartan-hydrochlorothiazide 320-25 MG per tablet  Commonly known as:  DIOVAN-HCT  Take 1 tablet by mouth daily.   Indication:  High Blood Pressure     Vitamin D 2000 UNITS Caps  Take 1 capsule (2,000 Units total) by mouth daily.   Indication:  vitamin D deficiency      ASK your doctor about these medications     Indication   magnesium gluconate 500 MG tablet  Commonly known as:  MAGONATE  Take 500 mg by mouth daily.            Follow-up Information   Follow up with [facility] On 07/06/2012. (Thursday at 10:00 with therapist Vilma Prader)    Contact information:   [address] [place]  [336] [phone]      Follow up with [facility] On 07/07/2012. (Friday at 11:15 with Dr Sabino Dick)    Contact information:   [address]  [place]  [336] [phone]      Follow-up recommendations:  Activity:  As tolerated Diet:  Low-sodium heart helathy diet  Comments:  Patient will  continue her care at [facility].  Total Discharge Time:  Greater than 30 minutes.  SignedNanine Means, PMH-NP 07/05/2012, 9:24 AM

## 2012-07-05 NOTE — Progress Notes (Signed)
[facility] Adult Case Management Discharge Plan :  Will you be returning to the same living situation after discharge: Yes,  home At discharge, do you have transportation home?:Yes,  friend Do you have the ability to pay for your medications:Yes,  MCD  Release of information consent forms completed and in the chart;  Patient's signature needed at discharge.  Patient to Follow up at: Follow-up Information   Follow up with [facility] On 07/06/2012. (Thursday at 10:00 with therapist Vilma Prader)    Contact information:   [address] [place]  [336] [phone]      Follow up with [facility] On 07/07/2012. (Friday at 11:15 with Dr Sabino Dick)    Contact information:   [address]  [place]  [336] [phone]      Patient denies SI/HI:   Yes,  yes    Safety Planning and Suicide Prevention discussed:  Yes,  yes  Ida Rogue 07/05/2012, 10:42 AM

## 2012-07-05 NOTE — BHH Group Notes (Signed)
[facility] LCSW Aftercare Discharge Planning Group Note   07/05/2012 11:43 AM  Participation Quality:  Appropriate     Mood/Affect:  Appropriate  Depression Rating:  3  Anxiety Rating:  7  Thoughts of Suicide:  No  Will you contract for safety?   NA  Current AVH:  No  Plan for Discharge/Comments:    Patient reports being ready for discharge home.  She shared a friend with pick her up for discharge.  Patient and CSW have discharge plan in place.  Transportation Means:   Supports:  Rafferty Postlewait, Joesph July

## 2012-07-05 NOTE — Progress Notes (Signed)
D: Patient reported a good day, interacted well with peers and staff, attended group, denied SI/HI and denied hallucinations. She seemed excited about going home tomorrow, stated she looked forward to that and felt ready to go. "I'm taking notes and I learnt what to do to make me feel better". Her only complaint was insomnia; "I didn't sleep good last night" A: Writer encouraged and supported patient; offered her Vistaril 50 mg and a repeat dose of 50 mg. R: Patient receptive to support and encouragement. Took HS medications without difficulty. Q 15 minute check continues as ordered to maintain safety.

## 2012-07-10 NOTE — Progress Notes (Signed)
Patient Discharge Instructions:  After Visit Summary (AVS):   Faxed to:  07/10/12 Discharge Summary Note:   Faxed to:  07/10/12 Psychiatric Admission Assessment Note:   Faxed to:  07/10/12 Suicide Risk Assessment - Discharge Assessment:   Faxed to:  07/10/12 Faxed/Sent to the Next Level Care provider:  07/10/12 Faxed to [facility] @ [phone] Faxed to [facility] @ [phone]  Jerelene Redden, 07/10/2012, 4:10 PM

## 2012-07-20 DIAGNOSIS — Z9289 Personal history of other medical treatment: Secondary | ICD-10-CM | POA: Insufficient documentation

## 2012-07-20 HISTORY — DX: Personal history of other medical treatment: Z92.89

## 2012-07-21 ENCOUNTER — Ambulatory Visit (HOSPITAL_BASED_OUTPATIENT_CLINIC_OR_DEPARTMENT_OTHER): Payer: Medicare Other | Admitting: Oncology

## 2012-07-21 ENCOUNTER — Other Ambulatory Visit (HOSPITAL_BASED_OUTPATIENT_CLINIC_OR_DEPARTMENT_OTHER): Payer: Medicare Other | Admitting: Lab

## 2012-07-21 ENCOUNTER — Telehealth: Payer: Self-pay | Admitting: Oncology

## 2012-07-21 VITALS — BP 153/82 | HR 98 | Temp 98.2°F | Resp 18 | Ht 65.0 in | Wt 225.1 lb

## 2012-07-21 DIAGNOSIS — N289 Disorder of kidney and ureter, unspecified: Secondary | ICD-10-CM

## 2012-07-21 DIAGNOSIS — F329 Major depressive disorder, single episode, unspecified: Secondary | ICD-10-CM

## 2012-07-21 DIAGNOSIS — N2889 Other specified disorders of kidney and ureter: Secondary | ICD-10-CM

## 2012-07-21 DIAGNOSIS — F3289 Other specified depressive episodes: Secondary | ICD-10-CM

## 2012-07-21 DIAGNOSIS — M25559 Pain in unspecified hip: Secondary | ICD-10-CM

## 2012-07-21 LAB — CBC WITH DIFFERENTIAL/PLATELET
BASO%: 0.4 % (ref 0.0–2.0)
Basophils Absolute: 0 10*3/uL (ref 0.0–0.1)
EOS%: 1.9 % (ref 0.0–7.0)
Eosinophils Absolute: 0.1 10*3/uL (ref 0.0–0.5)
HCT: 38.9 % (ref 34.8–46.6)
HGB: 12.9 g/dL (ref 11.6–15.9)
LYMPH%: 22.9 % (ref 14.0–49.7)
MCH: 26.8 pg (ref 25.1–34.0)
MCHC: 33.2 g/dL (ref 31.5–36.0)
MCV: 80.8 fL (ref 79.5–101.0)
MONO#: 0.4 10*3/uL (ref 0.1–0.9)
MONO%: 7 % (ref 0.0–14.0)
NEUT#: 4 10*3/uL (ref 1.5–6.5)
NEUT%: 67.8 % (ref 38.4–76.8)
Platelets: 255 10*3/uL (ref 145–400)
RBC: 4.81 10*6/uL (ref 3.70–5.45)
RDW: 14.6 % — ABNORMAL HIGH (ref 11.2–14.5)
WBC: 6 10*3/uL (ref 3.9–10.3)
lymph#: 1.4 10*3/uL (ref 0.9–3.3)

## 2012-07-21 LAB — COMPREHENSIVE METABOLIC PANEL (CC13)
ALT: 17 U/L (ref 0–55)
AST: 19 U/L (ref 5–34)
Albumin: 3.4 g/dL — ABNORMAL LOW (ref 3.5–5.0)
Alkaline Phosphatase: 102 U/L (ref 40–150)
BUN: 11.3 mg/dL (ref 7.0–26.0)
CO2: 29 mEq/L (ref 22–29)
Calcium: 9.8 mg/dL (ref 8.4–10.4)
Chloride: 103 mEq/L (ref 98–107)
Creatinine: 0.8 mg/dL (ref 0.6–1.1)
Glucose: 106 mg/dl — ABNORMAL HIGH (ref 70–99)
Potassium: 4.1 mEq/L (ref 3.5–5.1)
Sodium: 140 mEq/L (ref 136–145)
Total Bilirubin: 0.42 mg/dL (ref 0.20–1.20)
Total Protein: 7.5 g/dL (ref 6.4–8.3)

## 2012-07-21 NOTE — Progress Notes (Signed)
Hematology and Oncology Follow Up Visit  [name] [id number] November 22, 1945 67 y.o. 07/21/2012 3:09 PM  CC: [name] (C.Alan) Tenny Craw, M.D.    Principle Diagnosis: This is a 67 year old female with a renal mass, presented with 4.6 x 4.4 x 3.1 cm right renal mass suspicious for a neoplasm.  The patient refused a biopsy or primary treatment. Diagnosed in 2011.   Current therapy: Observation and surveillance  Interim History:  [name] presents today for a followup visit, a pleasant 67 year old with a renal mass that is suspicious for a malignancy; however, she has refused workup or treatment.  She has decided to continue with observation, and agreeable to follow up on an intermittent basis with me regarding that. Since the last time I saw her, she has not reported any flank pain.  She has not reported any back pain.  She does report some occasional back discomfort related to her arthritis; but overall thinks her back is better. It is not radiating.  It is not associated with any hematuria.  It is not associated with any neurological deficits.   Performance status and activity level remain reasonable. Reports right shoulder pain that has been ongoing for several months. She is reporting that her depression is better but she was recently hospitalized at [facility] for depression and feels slightly better today.    Medications: I have reviewed the patient's current medications. Current outpatient prescriptions:Cholecalciferol (VITAMIN D) 2000 UNITS CAPS, Take 1 capsule (2,000 Units total) by mouth daily., Disp: 30 capsule, Rfl: 0;  DULoxetine (CYMBALTA) 30 MG capsule, Take 2 capsules (60 mg total) by mouth daily., Disp: 60 capsule, Rfl: 0;  fluticasone (FLONASE) 50 MCG/ACT nasal spray, Place 50 sprays into the nose 4 (four) times daily., Disp: , Rfl:  lamoTRIgine (LAMICTAL) 200 MG tablet, Take 1 tablet (200 mg total) by mouth 2 (two) times daily., Disp: 60 tablet, Rfl: 0;  levothyroxine (SYNTHROID,  LEVOTHROID) 75 MCG tablet, Take 1 tablet (75 mcg total) by mouth daily., Disp: 30 tablet, Rfl: 0;  LORazepam (ATIVAN) 1 MG tablet, Take 1 tablet (1 mg total) by mouth 3 (three) times daily as needed for anxiety., Disp: 14 tablet, Rfl: 0 magnesium gluconate (MAGONATE) 500 MG tablet, Take 1 tablet (500 mg total) by mouth daily., Disp: 30 tablet, Rfl: 0;  promethazine (PHENERGAN) 25 MG tablet, Take 25 mg by mouth every 6 (six) hours as needed for nausea. , Disp: , Rfl: ;  rOPINIRole (REQUIP) 0.5 MG tablet, Take 0.5-1 mg by mouth 2 (two) times daily as needed. Takes during the day if needed for restless leg syndrome, Disp: , Rfl:  traZODone (DESYREL) 25 mg TABS, Take 1.5 tablets (75 mg total) by mouth at bedtime as needed for sleep., Disp: 30 tablet, Rfl: 0;  valsartan-hydrochlorothiazide (DIOVAN-HCT) 320-25 MG per tablet, Take 1 tablet by mouth daily., Disp: 30 tablet, Rfl: 0  Allergies:  Allergies  Allergen Reactions  . Geodon (Ziprasidone Hydrochloride) Other (See Comments)    Extremely aggitated  . Lithium Nausea Only and Other (See Comments)    Off balance, increased heart rate  . Talwin (Pentazocine) Other (See Comments)    Chest pain  . Toradol (Ketorolac Tromethamine) Other (See Comments)    hallucinations    Past Medical History, Surgical history, Social history, and Family History were reviewed and updated.  Review of Systems: Constitutional:  Negative for fever, chills, night sweats, anorexia, weight loss, pain. Cardiovascular: no chest pain or dyspnea on exertion Respiratory: no cough, shortness of breath, or  wheezing Neurological: no TIA or stroke symptoms Dermatological: negative ENT: negative Skin: Negative. Gastrointestinal: no abdominal pain, change in bowel habits, or black or bloody stools Genito-Urinary: no dysuria, trouble voiding, or hematuria Hematological and Lymphatic: negative Breast: negative Musculoskeletal: negative Remaining ROS negative.  Physical  Exam: Blood pressure 153/82, pulse 98, temperature 98.2 F (36.8 C), temperature source Oral, resp. rate 18, height 5\' 5"  (1.651 m), weight 225 lb 1.6 oz (102.105 kg), SpO2 97.00%. ECOG: 1 General appearance: alert Head: Normocephalic, without obvious abnormality, atraumatic Neck: no adenopathy, no carotid bruit, no JVD, supple, symmetrical, trachea midline and thyroid not enlarged, symmetric, no tenderness/mass/nodules Lymph nodes: Cervical, supraclavicular, and axillary nodes normal. Heart:regular rate and rhythm, S1, S2 normal, no murmur, click, rub or gallop Lung:chest clear, no wheezing, rales, normal symmetric air entry Abdomen: soft, non-tender, without masses or organomegaly EXT:no erythema, induration, or nodules   Lab Results: Lab Results  Component Value Date   WBC 6.0 07/21/2012   HGB 12.9 07/21/2012   HCT 38.9 07/21/2012   MCV 80.8 07/21/2012   PLT 255 07/21/2012     Chemistry      Component Value Date/Time   NA 140 07/21/2012 1415   NA 137 06/29/2012 1030   NA 143 06/02/2011 1008   K 4.1 07/21/2012 1415   K 3.6 06/29/2012 1030   K 3.9 06/02/2011 1008   CL 103 07/21/2012 1415   CL 97 06/29/2012 1030   CL 95* 06/02/2011 1008   CO2 29 07/21/2012 1415   CO2 27 06/29/2012 1030   CO2 29 06/02/2011 1008   BUN 11.3 07/21/2012 1415   BUN 16 06/29/2012 1030   BUN 14 06/02/2011 1008   CREATININE 0.8 07/21/2012 1415   CREATININE 0.76 06/29/2012 1030   CREATININE 0.8 06/02/2011 1008      Component Value Date/Time   CALCIUM 9.8 07/21/2012 1415   CALCIUM 10.0 06/29/2012 1030   CALCIUM 9.4 06/02/2011 1008   ALKPHOS 102 07/21/2012 1415   ALKPHOS 105 06/29/2012 1030   ALKPHOS 90* 06/02/2011 1008   AST 19 07/21/2012 1415   AST 25 06/29/2012 1030   AST 25 06/02/2011 1008   ALT 17 07/21/2012 1415   ALT 21 06/29/2012 1030   BILITOT 0.42 07/21/2012 1415   BILITOT 0.3 06/29/2012 1030   BILITOT 0.70 06/02/2011 1008      Impression and Plan:   68 year old female with the following issues:  1. A renal mass on the  right side very suspicious for malignancy.  I had another  discussion today discussing the ramification of not getting this tumor treated including local pain, hematuria, metastasis and ultimately more pain and possibly death from metastatic cancer.  She has continued to be adamant in refusing any further treatment.  At this point, we will proceed with a more palliative approach.  If she develops any symptoms, then we will address that as they arise.  At this point, she does not have any symptoms and no reason for any intervention.  2. Shoulder pain. Recommend conservative management. 3. Depression.  This seems to be under reasonable control.     4. Followup will be in 6 months' time.     [facility] 5/2/20143:09 PM

## 2012-07-23 ENCOUNTER — Encounter (HOSPITAL_COMMUNITY): Payer: Self-pay | Admitting: *Deleted

## 2012-07-23 ENCOUNTER — Emergency Department (HOSPITAL_COMMUNITY): Payer: Medicare Other

## 2012-07-23 ENCOUNTER — Emergency Department (HOSPITAL_COMMUNITY)
Admission: EM | Admit: 2012-07-23 | Discharge: 2012-07-23 | Disposition: A | Discharge status: Home / Self Care | Payer: Medicare Other | Attending: Emergency Medicine | Admitting: Emergency Medicine

## 2012-07-23 DIAGNOSIS — Z79899 Other long term (current) drug therapy: Secondary | ICD-10-CM | POA: Insufficient documentation

## 2012-07-23 DIAGNOSIS — F411 Generalized anxiety disorder: Secondary | ICD-10-CM | POA: Insufficient documentation

## 2012-07-23 DIAGNOSIS — Z8719 Personal history of other diseases of the digestive system: Secondary | ICD-10-CM | POA: Insufficient documentation

## 2012-07-23 DIAGNOSIS — Z862 Personal history of diseases of the blood and blood-forming organs and certain disorders involving the immune mechanism: Secondary | ICD-10-CM | POA: Insufficient documentation

## 2012-07-23 DIAGNOSIS — G2581 Restless legs syndrome: Secondary | ICD-10-CM | POA: Insufficient documentation

## 2012-07-23 DIAGNOSIS — Z8659 Personal history of other mental and behavioral disorders: Secondary | ICD-10-CM | POA: Insufficient documentation

## 2012-07-23 DIAGNOSIS — Y9389 Activity, other specified: Secondary | ICD-10-CM | POA: Insufficient documentation

## 2012-07-23 DIAGNOSIS — R110 Nausea: Secondary | ICD-10-CM | POA: Insufficient documentation

## 2012-07-23 DIAGNOSIS — F4310 Post-traumatic stress disorder, unspecified: Secondary | ICD-10-CM | POA: Insufficient documentation

## 2012-07-23 DIAGNOSIS — IMO0002 Reserved for concepts with insufficient information to code with codable children: Secondary | ICD-10-CM | POA: Insufficient documentation

## 2012-07-23 DIAGNOSIS — Z8669 Personal history of other diseases of the nervous system and sense organs: Secondary | ICD-10-CM | POA: Insufficient documentation

## 2012-07-23 DIAGNOSIS — R42 Dizziness and giddiness: Secondary | ICD-10-CM | POA: Insufficient documentation

## 2012-07-23 DIAGNOSIS — Z8709 Personal history of other diseases of the respiratory system: Secondary | ICD-10-CM | POA: Insufficient documentation

## 2012-07-23 DIAGNOSIS — Z8639 Personal history of other endocrine, nutritional and metabolic disease: Secondary | ICD-10-CM | POA: Insufficient documentation

## 2012-07-23 DIAGNOSIS — S0990XA Unspecified injury of head, initial encounter: Secondary | ICD-10-CM | POA: Insufficient documentation

## 2012-07-23 DIAGNOSIS — F319 Bipolar disorder, unspecified: Secondary | ICD-10-CM | POA: Insufficient documentation

## 2012-07-23 DIAGNOSIS — Z87448 Personal history of other diseases of urinary system: Secondary | ICD-10-CM | POA: Insufficient documentation

## 2012-07-23 DIAGNOSIS — W2209XA Striking against other stationary object, initial encounter: Secondary | ICD-10-CM | POA: Insufficient documentation

## 2012-07-23 DIAGNOSIS — H53149 Visual discomfort, unspecified: Secondary | ICD-10-CM | POA: Insufficient documentation

## 2012-07-23 DIAGNOSIS — Z85528 Personal history of other malignant neoplasm of kidney: Secondary | ICD-10-CM | POA: Insufficient documentation

## 2012-07-23 DIAGNOSIS — H579 Unspecified disorder of eye and adnexa: Secondary | ICD-10-CM | POA: Insufficient documentation

## 2012-07-23 DIAGNOSIS — Z8679 Personal history of other diseases of the circulatory system: Secondary | ICD-10-CM | POA: Insufficient documentation

## 2012-07-23 DIAGNOSIS — Y9289 Other specified places as the place of occurrence of the external cause: Secondary | ICD-10-CM | POA: Insufficient documentation

## 2012-07-23 DIAGNOSIS — E669 Obesity, unspecified: Secondary | ICD-10-CM | POA: Insufficient documentation

## 2012-07-23 DIAGNOSIS — N189 Chronic kidney disease, unspecified: Secondary | ICD-10-CM | POA: Insufficient documentation

## 2012-07-23 DIAGNOSIS — G43909 Migraine, unspecified, not intractable, without status migrainosus: Secondary | ICD-10-CM | POA: Insufficient documentation

## 2012-07-23 DIAGNOSIS — Z8551 Personal history of malignant neoplasm of bladder: Secondary | ICD-10-CM | POA: Insufficient documentation

## 2012-07-23 DIAGNOSIS — Z8742 Personal history of other diseases of the female genital tract: Secondary | ICD-10-CM | POA: Insufficient documentation

## 2012-07-23 DIAGNOSIS — G4733 Obstructive sleep apnea (adult) (pediatric): Secondary | ICD-10-CM | POA: Insufficient documentation

## 2012-07-23 MED ORDER — OXYCODONE-ACETAMINOPHEN 7.5-325 MG PO TABS: 1.0000 | ORAL_TABLET | ORAL | Status: DC | PRN

## 2012-07-23 MED ORDER — ONDANSETRON HCL 4 MG/2ML IJ SOLN
4.0000 mg | Freq: Once | INTRAMUSCULAR | Status: AC
  Administered 2012-07-23: 4 mg via INTRAVENOUS
  Filled 2012-07-23: qty 2

## 2012-07-23 MED ORDER — FENTANYL CITRATE 0.05 MG/ML IJ SOLN
100.0000 ug | Freq: Once | INTRAMUSCULAR | Status: AC
  Administered 2012-07-23: 100 ug via INTRAVENOUS
  Filled 2012-07-23: qty 2

## 2012-07-23 NOTE — ED Notes (Signed)
VHQ:IO96<EX> Expected date:07/23/12<BR> Expected time:<BR> Means of arrival:<BR> Comments:<BR> Fall 3-4 days ago/head injury/pain

## 2012-07-23 NOTE — ED Notes (Signed)
Pt back from CT

## 2012-07-23 NOTE — ED Notes (Signed)
md at bedside

## 2012-07-23 NOTE — ED Provider Notes (Signed)
History     CSN: [id number]  Arrival date & time 07/23/12  1717   First MD Initiated Contact with Patient 07/23/12 1736      Chief Complaint  Patient presents with  . Headache  . stable- possible concussion from Friday, cancer pt      HPI Per ems pt hx of renal cancer, not receiving chemo or radiation. Unrelated to this. Pt was sent from [facility] walk in clinic, they triaged pt and then sent her via EMS to the ED. Note states pt hit right side of head on Friday on the car. Headache right away. Nausea and dizziness started a few hours later. Pressure in the eyes. Pt drove herself to the walk in clinic and fully able to ambulate. In no acute distress.  Past Medical History  Diagnosis Date  . Abscess of Bartholin's gland   . History of pleural effusion   . History of attention deficit disorder   . History of endometriosis   . RLS (restless legs syndrome)   . Anxiety   . Obesity   . GERD (gastroesophageal reflux disease)   . Malignant neoplasm of bladder, part unspecified   . Renal mass 02/15/2012  . Dysrhythmia     h/o ventricular tachycardia- ablation resolved it  . Hypothyroidism   . Depression     Bipolar disorder  . OSA (obstructive sleep apnea)     uses oral appliance instead of CPAP  . Vertigo   . Chronic kidney disease     kidney cancer- pt states she has elected to not have it treated.  Marland Kitchen PONV (postoperative nausea and vomiting)   . Difficult intubation     told by [facility] that she was hard to intubate 15b yrs ago in [place]- surgery since then no problems  . Adverse effect of general anesthetic     felt paralyzed while receiving anesthesia  . Bipolar 1 disorder   . PTSD (post-traumatic stress disorder)     Past Surgical History  Procedure Laterality Date  . Thyroidectomy  1990  . Tubal ligation  1970s  . Nasoseptal reconstruction  1990s  . Cardiac electrophysiology mapping and ablation  2000s  . Tonsillectomy      as a child  . Uterine mass removal  03/2012    was  found to be benign    Family History  Problem Relation Age of Onset  . Allergies Father   . Allergies Sister     multiple  . Allergies Brother     multiple  . Allergies Daughter   . Heart disease Mother   . Heart disease Maternal Grandfather   . Colon cancer Paternal Grandmother   . Cancer Paternal Grandfather   . Skin cancer Father     History  Substance Use Topics  . Smoking status: Never Smoker   . Smokeless tobacco: Never Used  . Alcohol Use: No    OB History   Grav Para Term Preterm Abortions TAB SAB Ect Mult Living                  Review of Systems All other systems reviewed and are negative Allergies  Abilify; Geodon; Lithium; Talwin; and Toradol  Home Medications   Current Outpatient Rx  Name  Route  Sig  Dispense  Refill  . Cholecalciferol (VITAMIN D) 2000 UNITS CAPS   Oral   Take 1 capsule (2,000 Units total) by mouth daily.   30 capsule   0   . DULoxetine (CYMBALTA)  30 MG capsule   Oral   Take 2 capsules (60 mg total) by mouth daily.   60 capsule   0   . fluticasone (FLONASE) 50 MCG/ACT nasal spray   Nasal   Place 50 sprays into the nose 4 (four) times daily.         Marland Kitchen lamoTRIgine (LAMICTAL) 200 MG tablet   Oral   Take 1 tablet (200 mg total) by mouth 2 (two) times daily.   60 tablet   0   . levothyroxine (SYNTHROID, LEVOTHROID) 75 MCG tablet   Oral   Take 1 tablet (75 mcg total) by mouth daily.   30 tablet   0   . LORazepam (ATIVAN) 1 MG tablet   Oral   Take 1 tablet (1 mg total) by mouth 3 (three) times daily as needed for anxiety.   14 tablet   0   . magnesium gluconate (MAGONATE) 500 MG tablet   Oral   Take 1 tablet (500 mg total) by mouth daily.   30 tablet   0   . promethazine (PHENERGAN) 25 MG tablet   Oral   Take 25 mg by mouth every 6 (six) hours as needed for nausea.          Marland Kitchen rOPINIRole (REQUIP) 0.5 MG tablet   Oral   Take 0.5-1 mg by mouth 2 (two) times daily as needed. Takes during the day if needed  for restless leg syndrome         . traZODone (DESYREL) 25 mg TABS   Oral   Take 1.5 tablets (75 mg total) by mouth at bedtime as needed for sleep.   30 tablet   0   . valsartan-hydrochlorothiazide (DIOVAN-HCT) 320-25 MG per tablet   Oral   Take 1 tablet by mouth daily.   30 tablet   0   . oxyCODONE-acetaminophen (PERCOCET) 7.5-325 MG per tablet   Oral   Take 1 tablet by mouth every 4 (four) hours as needed for pain.   15 tablet   0     BP 124/71  Pulse 77  Temp(Src) 98 F (36.7 C) (Oral)  Resp 16  SpO2 96%  Physical Exam  Nursing note and vitals reviewed. Constitutional: She is oriented to person, place, and time. She appears well-developed and well-nourished. No distress.  HENT:  Head: Normocephalic and atraumatic.  Eyes: Pupils are equal, round, and reactive to light.  Neck: Normal range of motion.  Cardiovascular: Normal rate and intact distal pulses.   Pulmonary/Chest: No respiratory distress.  Abdominal: Normal appearance. She exhibits no distension.  Musculoskeletal: Normal range of motion.  Neurological: She is alert and oriented to person, place, and time. No cranial nerve deficit. GCS eye subscore is 4. GCS verbal subscore is 5. GCS motor subscore is 6.  Patient with photophobia  Skin: Skin is warm and dry. No rash noted.  Psychiatric: She has a normal mood and affect. Her behavior is normal.    ED Course  Procedures (including critical care time) Medications  fentaNYL (SUBLIMAZE) injection 100 mcg (100 mcg Intravenous Given 07/23/12 1841)  ondansetron (ZOFRAN) injection 4 mg (4 mg Intravenous Given 07/23/12 1843)    Labs Reviewed - No data to display Ct Head Wo Contrast  07/23/2012  *RADIOLOGY REPORT*  Clinical Data: Headache.  Hit head.  Nausea, dizziness.  CT HEAD WITHOUT CONTRAST  Technique:  Contiguous axial images were obtained from the base of the skull through the vertex without contrast.  Comparison: MRI 02/11/2010  Findings: No acute  intracranial abnormality.  Specifically, no hemorrhage, hydrocephalus, mass lesion, acute infarction, or significant intracranial injury.  No acute calvarial abnormality. Mastoids are clear.  Orbital soft tissues unremarkable.  IMPRESSION: No intracranial abnormality.   Original Report Authenticated By: Charlett Nose, M.D.      1. Head injury, initial encounter   2. Migraine       MDM          Nelia Shi, MD 07/23/12 2110

## 2012-07-23 NOTE — ED Notes (Signed)
Per ems pt hx of renal cancer, not receiving chemo or radiation. Unrelated to this. Pt was sent from [facility] walk in clinic, they triaged pt and then sent her via EMS to the ED. Note states pt hit right side of head on Friday on the car. Headache right away. Nausea and dizziness started a few hours later. Pressure in the eyes.  Pt drove herself to the walk in clinic and fully able to ambulate. In no acute distress. Pain 6/10.

## 2012-07-23 NOTE — ED Notes (Signed)
Pt alert and oriented x4. Respirations even and unlabored, bilateral symmetrical rise and fall of chest. Skin warm and dry. In no acute distress. Denies needs.   

## 2012-08-31 ENCOUNTER — Ambulatory Visit: Payer: Medicare Other | Admitting: Pulmonary Disease

## 2012-09-06 ENCOUNTER — Other Ambulatory Visit: Payer: Self-pay | Admitting: Oncology

## 2012-09-26 ENCOUNTER — Ambulatory Visit: Payer: Medicare Other | Admitting: Pulmonary Disease

## 2012-10-02 ENCOUNTER — Other Ambulatory Visit: Payer: Self-pay | Admitting: Pulmonary Disease

## 2012-10-02 MED ORDER — ROPINIROLE HCL 0.5 MG PO TABS: 0.5000 mg | ORAL_TABLET | Freq: Two times a day (BID) | ORAL | Status: DC | PRN

## 2012-10-18 ENCOUNTER — Other Ambulatory Visit: Payer: Self-pay | Admitting: Pulmonary Disease

## 2012-12-22 ENCOUNTER — Telehealth: Payer: Self-pay | Admitting: *Deleted

## 2012-12-22 NOTE — Telephone Encounter (Signed)
Patient called and left voice message that she is having pain at her kidney area and discomfort is increasing. states this is the first time she has had pain and wants to know if it would be possible to see dr Clelia Croft sooner than 01-24-13? Returned call x 2 left message to call me.

## 2012-12-28 ENCOUNTER — Ambulatory Visit: Payer: Self-pay | Admitting: *Deleted

## 2013-01-10 ENCOUNTER — Ambulatory Visit: Payer: Medicare Other | Admitting: Pulmonary Disease

## 2013-01-24 ENCOUNTER — Telehealth: Payer: Self-pay | Admitting: Oncology

## 2013-01-24 ENCOUNTER — Other Ambulatory Visit (HOSPITAL_BASED_OUTPATIENT_CLINIC_OR_DEPARTMENT_OTHER): Payer: Medicare Other | Admitting: Lab

## 2013-01-24 ENCOUNTER — Ambulatory Visit (HOSPITAL_BASED_OUTPATIENT_CLINIC_OR_DEPARTMENT_OTHER): Payer: Medicare Other | Admitting: Oncology

## 2013-01-24 VITALS — BP 126/74 | HR 75 | Temp 97.9°F | Resp 18 | Ht 65.0 in | Wt 231.4 lb

## 2013-01-24 DIAGNOSIS — N2889 Other specified disorders of kidney and ureter: Secondary | ICD-10-CM

## 2013-01-24 DIAGNOSIS — N289 Disorder of kidney and ureter, unspecified: Secondary | ICD-10-CM

## 2013-01-24 DIAGNOSIS — F329 Major depressive disorder, single episode, unspecified: Secondary | ICD-10-CM

## 2013-01-24 DIAGNOSIS — F3289 Other specified depressive episodes: Secondary | ICD-10-CM

## 2013-01-24 LAB — COMPREHENSIVE METABOLIC PANEL (CC13)
ALT: 17 U/L (ref 0–55)
AST: 18 U/L (ref 5–34)
Albumin: 3.6 g/dL (ref 3.5–5.0)
Alkaline Phosphatase: 97 U/L (ref 40–150)
Anion Gap: 10 mEq/L (ref 3–11)
BUN: 16.1 mg/dL (ref 7.0–26.0)
CO2: 27 mEq/L (ref 22–29)
Calcium: 10.1 mg/dL (ref 8.4–10.4)
Chloride: 104 mEq/L (ref 98–109)
Creatinine: 0.8 mg/dL (ref 0.6–1.1)
Glucose: 129 mg/dl (ref 70–140)
Potassium: 4 mEq/L (ref 3.5–5.1)
Sodium: 141 mEq/L (ref 136–145)
Total Bilirubin: 0.4 mg/dL (ref 0.20–1.20)
Total Protein: 7.4 g/dL (ref 6.4–8.3)

## 2013-01-24 LAB — CBC WITH DIFFERENTIAL/PLATELET
BASO%: 0.8 % (ref 0.0–2.0)
Basophils Absolute: 0 10*3/uL (ref 0.0–0.1)
EOS%: 3.6 % (ref 0.0–7.0)
Eosinophils Absolute: 0.2 10*3/uL (ref 0.0–0.5)
HCT: 41.3 % (ref 34.8–46.6)
HGB: 13.5 g/dL (ref 11.6–15.9)
LYMPH%: 34 % (ref 14.0–49.7)
MCH: 27.2 pg (ref 25.1–34.0)
MCHC: 32.7 g/dL (ref 31.5–36.0)
MCV: 83 fL (ref 79.5–101.0)
MONO#: 0.4 10*3/uL (ref 0.1–0.9)
MONO%: 7.5 % (ref 0.0–14.0)
NEUT#: 3.1 10*3/uL (ref 1.5–6.5)
NEUT%: 54.1 % (ref 38.4–76.8)
Platelets: 261 10*3/uL (ref 145–400)
RBC: 4.98 10*6/uL (ref 3.70–5.45)
RDW: 14.8 % — ABNORMAL HIGH (ref 11.2–14.5)
WBC: 5.8 10*3/uL (ref 3.9–10.3)
lymph#: 2 10*3/uL (ref 0.9–3.3)

## 2013-01-24 NOTE — Telephone Encounter (Signed)
gv and printed appt sched and avs for pt for May...gv pt barium and inst

## 2013-01-24 NOTE — Progress Notes (Signed)
Hematology and Oncology Follow Up Visit  [name] [id number] 1945/09/29 67 y.o. 01/24/2013 9:43 AM  CC: [name] ([name]) [name], M.D.    Principle Diagnosis: This is a 67 year old female with a renal mass, presented with 4.6 x 4.4 x 3.1 cm right renal mass suspicious for a neoplasm.  The patient refused a biopsy or primary treatment. Diagnosed in 2011.   Current therapy: Observation and surveillance  Interim History:  [name] presents today for a followup visit, a pleasant 67 year old with a renal mass that is suspicious for a malignancy; however, she has refused workup or treatment.  She has decided to continue with observation, and agreeable to follow up on an intermittent basis with me regarding that. Since the last time I saw her, she has not reported any flank pain.  She has not reported any back pain.  She does report some occasional back discomfort that have resolved now. It is not radiating.  It is not associated with any hematuria.  It is not associated with any neurological deficits.   Performance status and activity level remain reasonable. She is reporting that her depression is about the same at this point.  Medications: I have reviewed the patient's current medications. Current Outpatient Prescriptions  Medication Sig Dispense Refill  . Cholecalciferol (VITAMIN D) 2000 UNITS CAPS Take 1 capsule (2,000 Units total) by mouth daily.  30 capsule  0  . DULoxetine (CYMBALTA) 30 MG capsule Take 2 capsules (60 mg total) by mouth daily.  60 capsule  0  . fluticasone (FLONASE) 50 MCG/ACT nasal spray Place 50 sprays into the nose 4 (four) times daily.      Marland Kitchen lamoTRIgine (LAMICTAL) 200 MG tablet Take 1 tablet (200 mg total) by mouth 2 (two) times daily.  60 tablet  0  . levothyroxine (SYNTHROID, LEVOTHROID) 75 MCG tablet Take 1 tablet (75 mcg total) by mouth daily.  30 tablet  0  . LORazepam (ATIVAN) 1 MG tablet Take 1 tablet (1 mg total) by mouth 3 (three) times daily as needed for  anxiety.  14 tablet  0  . magnesium gluconate (MAGONATE) 500 MG tablet Take 1 tablet (500 mg total) by mouth daily.  30 tablet  0  . oxyCODONE-acetaminophen (PERCOCET) 7.5-325 MG per tablet Take 1 tablet by mouth every 4 (four) hours as needed for pain.  15 tablet  0  . promethazine (PHENERGAN) 25 MG tablet Take 25 mg by mouth every 6 (six) hours as needed for nausea.       Marland Kitchen rOPINIRole (REQUIP) 0.5 MG tablet Take 1-2 tablets (0.5-1 mg total) by mouth 2 (two) times daily as needed. Takes during the day if needed for restless leg syndrome  90 tablet  2  . rOPINIRole (REQUIP) 0.5 MG tablet TAKE 1 TABLET LATE IN THE AFTERNOON AND 2 TABELTS AFTER DINNER  90 tablet  1  . traZODone (DESYREL) 25 mg TABS Take 1.5 tablets (75 mg total) by mouth at bedtime as needed for sleep.  30 tablet  0  . valsartan-hydrochlorothiazide (DIOVAN-HCT) 320-25 MG per tablet Take 1 tablet by mouth daily.  30 tablet  0   No current facility-administered medications for this visit.    Allergies:  Allergies  Allergen Reactions  . Abilify [Aripiprazole] Other (See Comments)    jerking  . Geodon [Ziprasidone Hydrochloride] Other (See Comments)    Extremely aggitated  . Lithium Nausea Only and Other (See Comments)    Off balance, increased heart rate  . Talwin [Pentazocine] Other (  See Comments)    Chest pain  . Toradol [Ketorolac Tromethamine] Other (See Comments)    hallucinations    Past Medical History, Surgical history, Social history, and Family History were reviewed and updated.  Review of Systems:  Remaining ROS negative.  Physical Exam: Blood pressure 126/74, pulse 75, temperature 97.9 F (36.6 C), temperature source Oral, resp. rate 18, height 5\' 5"  (1.651 m), weight 231 lb 6.4 oz (104.962 kg), SpO2 97.00%. ECOG: 1 General appearance: alert Head: Normocephalic, without obvious abnormality, atraumatic Neck: no adenopathy, no carotid bruit, no JVD, supple, symmetrical, trachea midline and thyroid not  enlarged, symmetric, no tenderness/mass/nodules Lymph nodes: Cervical, supraclavicular, and axillary nodes normal. Heart:regular rate and rhythm, S1, S2 normal, no murmur, click, rub or gallop Lung:chest clear, no wheezing, rales, normal symmetric air entry Abdomen: soft, non-tender, without masses or organomegaly EXT:no erythema, induration, or nodules   Lab Results: Lab Results  Component Value Date   WBC 5.8 01/24/2013   HGB 13.5 01/24/2013   HCT 41.3 01/24/2013   MCV 83.0 01/24/2013   PLT 261 01/24/2013     Chemistry      Component Value Date/Time   NA 140 07/21/2012 1415   NA 137 06/29/2012 1030   NA 143 06/02/2011 1008   K 4.1 07/21/2012 1415   K 3.6 06/29/2012 1030   K 3.9 06/02/2011 1008   CL 103 07/21/2012 1415   CL 97 06/29/2012 1030   CL 95* 06/02/2011 1008   CO2 29 07/21/2012 1415   CO2 27 06/29/2012 1030   CO2 29 06/02/2011 1008   BUN 11.3 07/21/2012 1415   BUN 16 06/29/2012 1030   BUN 14 06/02/2011 1008   CREATININE 0.8 07/21/2012 1415   CREATININE 0.76 06/29/2012 1030   CREATININE 0.8 06/02/2011 1008      Component Value Date/Time   CALCIUM 9.8 07/21/2012 1415   CALCIUM 10.0 06/29/2012 1030   CALCIUM 9.4 06/02/2011 1008   ALKPHOS 102 07/21/2012 1415   ALKPHOS 105 06/29/2012 1030   ALKPHOS 90* 06/02/2011 1008   AST 19 07/21/2012 1415   AST 25 06/29/2012 1030   AST 25 06/02/2011 1008   ALT 17 07/21/2012 1415   ALT 21 06/29/2012 1030   ALT 27 06/02/2011 1008   BILITOT 0.42 07/21/2012 1415   BILITOT 0.3 06/29/2012 1030   BILITOT 0.70 06/02/2011 1008      Impression and Plan:   67 year old female with the following issues:  1. A renal mass on the right side very suspicious for malignancy.  I had another  discussion today discussing the ramification of not getting this tumor treated including local pain, hematuria, metastasis and ultimately more pain and possibly death from metastatic cancer.  She has continued to be adamant in refusing any further treatment.  At this point, we will proceed with  a more palliative approach.  If she develops any symptoms, then we will address that as they arise.  At this point, she does not have any symptoms and no reason for any intervention. I think it is reasonable however to obtain a CT scan to assess the status of the tumor to give [place] a better idea of the pace of her disease. 2. Depression.  This seems to be under reasonable control.     3. Followup will be in 6 months' time.     Shakendra Griffeth 11/5/20149:43 AM

## 2013-01-24 NOTE — Addendum Note (Signed)
Addended by: Reesa Chew on: 01/24/2013 09:54 AM   Modules accepted: Orders

## 2013-01-31 ENCOUNTER — Ambulatory Visit (HOSPITAL_COMMUNITY)
Admission: RE | Admit: 2013-01-31 | Discharge: 2013-01-31 | Disposition: A | Discharge status: Home / Self Care | Payer: Medicare Other | Source: Ambulatory Visit | Attending: Oncology | Admitting: Oncology

## 2013-01-31 ENCOUNTER — Other Ambulatory Visit: Payer: Self-pay | Admitting: Oncology

## 2013-01-31 ENCOUNTER — Encounter (HOSPITAL_COMMUNITY): Payer: Self-pay

## 2013-01-31 DIAGNOSIS — R109 Unspecified abdominal pain: Secondary | ICD-10-CM | POA: Insufficient documentation

## 2013-01-31 DIAGNOSIS — N281 Cyst of kidney, acquired: Secondary | ICD-10-CM | POA: Insufficient documentation

## 2013-01-31 DIAGNOSIS — N2889 Other specified disorders of kidney and ureter: Secondary | ICD-10-CM

## 2013-01-31 DIAGNOSIS — D3000 Benign neoplasm of unspecified kidney: Secondary | ICD-10-CM | POA: Insufficient documentation

## 2013-01-31 DIAGNOSIS — D739 Disease of spleen, unspecified: Secondary | ICD-10-CM | POA: Insufficient documentation

## 2013-01-31 DIAGNOSIS — K8020 Calculus of gallbladder without cholecystitis without obstruction: Secondary | ICD-10-CM | POA: Insufficient documentation

## 2013-01-31 DIAGNOSIS — C649 Malignant neoplasm of unspecified kidney, except renal pelvis: Secondary | ICD-10-CM | POA: Insufficient documentation

## 2013-01-31 DIAGNOSIS — D259 Leiomyoma of uterus, unspecified: Secondary | ICD-10-CM | POA: Insufficient documentation

## 2013-01-31 HISTORY — DX: Essential (primary) hypertension: I10

## 2013-01-31 MED ORDER — IOHEXOL 300 MG/ML  SOLN
100.0000 mL | Freq: Once | INTRAMUSCULAR | Status: AC | PRN
  Administered 2013-01-31: 100 mL via INTRAVENOUS

## 2013-02-08 ENCOUNTER — Telehealth: Payer: Self-pay | Admitting: Medical Oncology

## 2013-02-08 ENCOUNTER — Other Ambulatory Visit: Payer: Self-pay | Admitting: Pulmonary Disease

## 2013-02-08 NOTE — Telephone Encounter (Signed)
Patient LVMOM requesting results of CT scan from 01/31/13. Reviewed with MD and informed patient per MD, "no change, looks good." Patient expressed thanks, no further questions at this, knows to call office with any questions or concerns.

## 2013-02-19 ENCOUNTER — Ambulatory Visit: Payer: Medicare Other | Admitting: Pulmonary Disease

## 2013-02-20 ENCOUNTER — Ambulatory Visit: Payer: Self-pay | Admitting: Dietician

## 2013-03-13 ENCOUNTER — Ambulatory Visit: Payer: Self-pay | Admitting: Pulmonary Disease

## 2013-03-30 ENCOUNTER — Other Ambulatory Visit: Payer: Self-pay | Admitting: Pulmonary Disease

## 2013-04-13 ENCOUNTER — Telehealth: Payer: Self-pay | Admitting: Pulmonary Disease

## 2013-04-13 ENCOUNTER — Ambulatory Visit: Payer: Self-pay | Admitting: Pulmonary Disease

## 2013-04-13 NOTE — Telephone Encounter (Signed)
lmomtcb x1 for pt 

## 2013-04-16 MED ORDER — ROPINIROLE HCL 0.5 MG PO TABS: ORAL_TABLET | ORAL | Status: DC

## 2013-04-16 NOTE — Telephone Encounter (Signed)
Can call in 30 days with no fills.  Let her know the [facility] medical board does not let us refill meds if we are not seeing pt yearly.

## 2013-04-16 NOTE — Telephone Encounter (Signed)
Pt  Last seen 12-2011, she has scheduled and cancelled 6 appts since then. Pt has set another appt on 04/23/13. She is asking for a refill on requip. Please advise if ok to refill. [place] Bing, CMA

## 2013-04-16 NOTE — Telephone Encounter (Signed)
Rx has been sent in. Pt is aware to keep her ROV for further fills.

## 2013-04-20 ENCOUNTER — Emergency Department (HOSPITAL_COMMUNITY): Payer: Medicare Other

## 2013-04-20 ENCOUNTER — Observation Stay (HOSPITAL_COMMUNITY): Payer: Medicare Other

## 2013-04-20 ENCOUNTER — Encounter (HOSPITAL_COMMUNITY): Payer: Self-pay | Admitting: Emergency Medicine

## 2013-04-20 ENCOUNTER — Observation Stay (HOSPITAL_COMMUNITY)
Admission: EM | Admit: 2013-04-20 | Discharge: 2013-04-22 | Disposition: A | Discharge status: Home / Self Care | Payer: Medicare Other | Attending: Internal Medicine | Admitting: Internal Medicine

## 2013-04-20 DIAGNOSIS — G4733 Obstructive sleep apnea (adult) (pediatric): Secondary | ICD-10-CM | POA: Insufficient documentation

## 2013-04-20 DIAGNOSIS — R45851 Suicidal ideations: Secondary | ICD-10-CM | POA: Insufficient documentation

## 2013-04-20 DIAGNOSIS — F339 Major depressive disorder, recurrent, unspecified: Secondary | ICD-10-CM | POA: Diagnosis present

## 2013-04-20 DIAGNOSIS — F29 Unspecified psychosis not due to a substance or known physiological condition: Secondary | ICD-10-CM | POA: Insufficient documentation

## 2013-04-20 DIAGNOSIS — C649 Malignant neoplasm of unspecified kidney, except renal pelvis: Secondary | ICD-10-CM | POA: Insufficient documentation

## 2013-04-20 DIAGNOSIS — F3130 Bipolar disorder, current episode depressed, mild or moderate severity, unspecified: Secondary | ICD-10-CM | POA: Insufficient documentation

## 2013-04-20 DIAGNOSIS — G9349 Other encephalopathy: Principal | ICD-10-CM | POA: Insufficient documentation

## 2013-04-20 DIAGNOSIS — I10 Essential (primary) hypertension: Secondary | ICD-10-CM | POA: Insufficient documentation

## 2013-04-20 DIAGNOSIS — G9340 Encephalopathy, unspecified: Secondary | ICD-10-CM

## 2013-04-20 DIAGNOSIS — G2581 Restless legs syndrome: Secondary | ICD-10-CM | POA: Insufficient documentation

## 2013-04-20 DIAGNOSIS — E669 Obesity, unspecified: Secondary | ICD-10-CM | POA: Insufficient documentation

## 2013-04-20 DIAGNOSIS — F411 Generalized anxiety disorder: Secondary | ICD-10-CM | POA: Insufficient documentation

## 2013-04-20 DIAGNOSIS — G459 Transient cerebral ischemic attack, unspecified: Secondary | ICD-10-CM

## 2013-04-20 DIAGNOSIS — K219 Gastro-esophageal reflux disease without esophagitis: Secondary | ICD-10-CM | POA: Insufficient documentation

## 2013-04-20 DIAGNOSIS — Z79899 Other long term (current) drug therapy: Secondary | ICD-10-CM | POA: Insufficient documentation

## 2013-04-20 DIAGNOSIS — E039 Hypothyroidism, unspecified: Secondary | ICD-10-CM | POA: Insufficient documentation

## 2013-04-20 DIAGNOSIS — R42 Dizziness and giddiness: Secondary | ICD-10-CM | POA: Insufficient documentation

## 2013-04-20 DIAGNOSIS — N2889 Other specified disorders of kidney and ureter: Secondary | ICD-10-CM

## 2013-04-20 DIAGNOSIS — Z23 Encounter for immunization: Secondary | ICD-10-CM | POA: Insufficient documentation

## 2013-04-20 LAB — CBC WITH DIFFERENTIAL/PLATELET
Basophils Absolute: 0 10*3/uL (ref 0.0–0.1)
Basophils Relative: 1 % (ref 0–1)
Eosinophils Absolute: 0.1 10*3/uL (ref 0.0–0.7)
Eosinophils Relative: 3 % (ref 0–5)
HCT: 42.2 % (ref 36.0–46.0)
Hemoglobin: 14.1 g/dL (ref 12.0–15.0)
Lymphocytes Relative: 32 % (ref 12–46)
Lymphs Abs: 1.4 10*3/uL (ref 0.7–4.0)
MCH: 28.1 pg (ref 26.0–34.0)
MCHC: 33.4 g/dL (ref 30.0–36.0)
MCV: 84.2 fL (ref 78.0–100.0)
Monocytes Absolute: 0.4 10*3/uL (ref 0.1–1.0)
Monocytes Relative: 9 % (ref 3–12)
Neutro Abs: 2.5 10*3/uL (ref 1.7–7.7)
Neutrophils Relative %: 57 % (ref 43–77)
Platelets: 245 10*3/uL (ref 150–400)
RBC: 5.01 MIL/uL (ref 3.87–5.11)
RDW: 14.5 % (ref 11.5–15.5)
WBC: 4.4 10*3/uL (ref 4.0–10.5)

## 2013-04-20 LAB — URINALYSIS, ROUTINE W REFLEX MICROSCOPIC
Bilirubin Urine: NEGATIVE
Glucose, UA: NEGATIVE mg/dL
Hgb urine dipstick: NEGATIVE
Ketones, ur: NEGATIVE mg/dL
Nitrite: NEGATIVE
Protein, ur: NEGATIVE mg/dL
Specific Gravity, Urine: 1.015 (ref 1.005–1.030)
Urobilinogen, UA: 0.2 mg/dL (ref 0.0–1.0)
pH: 6.5 (ref 5.0–8.0)

## 2013-04-20 LAB — BASIC METABOLIC PANEL
BUN: 15 mg/dL (ref 6–23)
CO2: 26 mEq/L (ref 19–32)
Calcium: 9.7 mg/dL (ref 8.4–10.5)
Chloride: 101 mEq/L (ref 96–112)
Creatinine, Ser: 0.75 mg/dL (ref 0.50–1.10)
GFR calc Af Amer: >90 mL/min (ref >90)
GFR calc non Af Amer: 85 mL/min — ABNORMAL LOW (ref >90)
Glucose, Bld: 90 mg/dL (ref 70–99)
Potassium: 4.6 mEq/L (ref 3.7–5.3)
Sodium: 140 mEq/L (ref 137–147)

## 2013-04-20 LAB — URINE MICROSCOPIC-ADD ON

## 2013-04-20 MED ORDER — ROPINIROLE HCL 0.5 MG PO TABS
0.5000 mg | ORAL_TABLET | Freq: Three times a day (TID) | ORAL | Status: DC
  Administered 2013-04-20 – 2013-04-22 (×5): 0.5 mg via ORAL
  Filled 2013-04-20 (×7): qty 1

## 2013-04-20 MED ORDER — ALUM & MAG HYDROXIDE-SIMETH 200-200-20 MG/5ML PO SUSP
30.0000 mL | Freq: Four times a day (QID) | ORAL | Status: DC | PRN
Start: 2013-04-20 — End: 2013-04-22
  Administered 2013-04-20 – 2013-04-22 (×3): 30 mL via ORAL
  Filled 2013-04-20 (×3): qty 30

## 2013-04-20 MED ORDER — SODIUM CHLORIDE 0.9 % IJ SOLN: 3.0000 mL | INTRAMUSCULAR | Status: DC | PRN

## 2013-04-20 MED ORDER — IRBESARTAN 300 MG PO TABS
300.0000 mg | ORAL_TABLET | Freq: Every day | ORAL | Status: DC
  Administered 2013-04-21 – 2013-04-22 (×2): 300 mg via ORAL
  Filled 2013-04-20 (×2): qty 1

## 2013-04-20 MED ORDER — SODIUM CHLORIDE 0.9 % IJ SOLN
3.0000 mL | Freq: Two times a day (BID) | INTRAMUSCULAR | Status: DC
  Administered 2013-04-20 – 2013-04-22 (×4): 3 mL via INTRAVENOUS

## 2013-04-20 MED ORDER — LORAZEPAM 2 MG/ML IJ SOLN
INTRAMUSCULAR | Status: AC
  Filled 2013-04-20: qty 1

## 2013-04-20 MED ORDER — SODIUM CHLORIDE 0.9 % IV SOLN: 250.0000 mL | INTRAVENOUS | Status: DC | PRN

## 2013-04-20 MED ORDER — HYDROCHLOROTHIAZIDE 25 MG PO TABS
25.0000 mg | ORAL_TABLET | Freq: Every day | ORAL | Status: DC
  Administered 2013-04-21 – 2013-04-22 (×2): 25 mg via ORAL
  Filled 2013-04-20 (×2): qty 1

## 2013-04-20 MED ORDER — VALSARTAN-HYDROCHLOROTHIAZIDE 320-25 MG PO TABS: 1.0000 | ORAL_TABLET | Freq: Every day | ORAL | Status: DC

## 2013-04-20 MED ORDER — LAMOTRIGINE 100 MG PO TABS
100.0000 mg | ORAL_TABLET | Freq: Two times a day (BID) | ORAL | Status: DC
  Administered 2013-04-20 – 2013-04-21 (×2): 100 mg via ORAL
  Filled 2013-04-20 (×3): qty 1

## 2013-04-20 MED ORDER — ACETAMINOPHEN 325 MG PO TABS
650.0000 mg | ORAL_TABLET | Freq: Four times a day (QID) | ORAL | Status: DC | PRN
  Administered 2013-04-21 (×2): 650 mg via ORAL
  Filled 2013-04-20 (×2): qty 2

## 2013-04-20 MED ORDER — DULOXETINE HCL 60 MG PO CPEP
60.0000 mg | ORAL_CAPSULE | Freq: Every day | ORAL | Status: DC
  Administered 2013-04-21: 60 mg via ORAL
  Filled 2013-04-20: qty 1

## 2013-04-20 MED ORDER — SODIUM CHLORIDE 0.9 % IV BOLUS (SEPSIS)
1000.0000 mL | Freq: Once | INTRAVENOUS | Status: AC
  Administered 2013-04-20: 1000 mL via INTRAVENOUS

## 2013-04-20 MED ORDER — NEBIVOLOL HCL 5 MG PO TABS
5.0000 mg | ORAL_TABLET | Freq: Every day | ORAL | Status: DC
  Administered 2013-04-21 – 2013-04-22 (×2): 5 mg via ORAL
  Filled 2013-04-20 (×2): qty 1

## 2013-04-20 MED ORDER — PNEUMOCOCCAL VAC POLYVALENT 25 MCG/0.5ML IJ INJ
0.5000 mL | INJECTION | INTRAMUSCULAR | Status: AC
  Administered 2013-04-21: 0.5 mL via INTRAMUSCULAR
  Filled 2013-04-20 (×2): qty 0.5

## 2013-04-20 MED ORDER — ROPINIROLE HCL 0.25 MG PO TABS
0.2500 mg | ORAL_TABLET | Freq: Three times a day (TID) | ORAL | Status: DC
Start: 2013-04-20 — End: 2013-04-20
  Filled 2013-04-20: qty 1

## 2013-04-20 MED ORDER — ASPIRIN 325 MG PO TABS
325.0000 mg | ORAL_TABLET | Freq: Every day | ORAL | Status: DC
  Administered 2013-04-20 – 2013-04-21 (×2): 325 mg via ORAL
  Filled 2013-04-20 (×2): qty 1

## 2013-04-20 MED ORDER — ACETAMINOPHEN 650 MG RE SUPP: 650.0000 mg | Freq: Four times a day (QID) | RECTAL | Status: DC | PRN

## 2013-04-20 MED ORDER — LORAZEPAM 2 MG/ML IJ SOLN
1.0000 mg | Freq: Once | INTRAMUSCULAR | Status: AC
  Administered 2013-04-20: 1 mg via INTRAVENOUS

## 2013-04-20 MED ORDER — LORAZEPAM 1 MG PO TABS
1.0000 mg | ORAL_TABLET | Freq: Three times a day (TID) | ORAL | Status: DC | PRN
  Administered 2013-04-22: 1 mg via ORAL
  Filled 2013-04-20: qty 1

## 2013-04-20 MED ORDER — ENOXAPARIN SODIUM 40 MG/0.4ML ~~LOC~~ SOLN
40.0000 mg | SUBCUTANEOUS | Status: DC
  Administered 2013-04-20 – 2013-04-21 (×2): 40 mg via SUBCUTANEOUS
  Filled 2013-04-20 (×3): qty 0.4

## 2013-04-20 MED ORDER — ONDANSETRON HCL 4 MG/2ML IJ SOLN
4.0000 mg | Freq: Four times a day (QID) | INTRAMUSCULAR | Status: DC | PRN
  Administered 2013-04-22: 4 mg via INTRAVENOUS
  Filled 2013-04-20: qty 2

## 2013-04-20 MED ORDER — LEVOTHYROXINE SODIUM 88 MCG PO TABS
88.0000 ug | ORAL_TABLET | Freq: Every day | ORAL | Status: DC
  Administered 2013-04-21 – 2013-04-22 (×2): 88 ug via ORAL
  Filled 2013-04-20 (×3): qty 1

## 2013-04-20 MED ORDER — ONDANSETRON HCL 4 MG PO TABS
4.0000 mg | ORAL_TABLET | Freq: Four times a day (QID) | ORAL | Status: DC | PRN
Start: 2013-04-20 — End: 2013-04-22

## 2013-04-20 NOTE — ED Provider Notes (Signed)
CSN: [id number]     Arrival date & time 04/20/13  1544 History   First MD Initiated Contact with Patient 04/20/13 1605     Chief Complaint  Patient presents with  . Dizziness   (Consider location/radiation/quality/duration/timing/severity/associated sxs/prior Treatment) Patient is a 68 y.o. female presenting with dizziness. The history is provided by the patient. No language interpreter was used.  Dizziness Quality:  Vertigo Associated symptoms: nausea   Associated symptoms: no chest pain, no shortness of breath and no vomiting   Associated symptoms comment:  The patient's history is complicated with symptoms of vertigo for about 2 weeks. She describes her symptoms as room-spinning dizziness, walking off balance. It feels like what has been diagnosed as vertigo in the past and improves when she takes her Meclizine. She has some nausea but reports "I always have nausea" and this is unchanged. She states for the past 2-3 days, she has been sleeping for most of the day, has moderate to severe anhedonia, does not have energy to do anything during the day, crying a lot. She states she is not suicidal and has not tried to harm herself. She presented today because she feels she can't focus on anything. She would try to dial a phone number with the number in her mind but inputs the wrong numbers on the phone. She reports she can't remember names. She denies headache, visual changes, lateralized weakness, recent fall or injury. She has no chest pain, cough, vomiting or diarrhea.    Past Medical History  Diagnosis Date  . Abscess of Bartholin's gland   . History of pleural effusion   . History of attention deficit disorder   . History of endometriosis   . RLS (restless legs syndrome)   . Anxiety   . Obesity   . GERD (gastroesophageal reflux disease)   . Renal mass 02/15/2012  . Dysrhythmia     h/o ventricular tachycardia- ablation resolved it  . Hypothyroidism   . Depression     Bipolar disorder   . OSA (obstructive sleep apnea)     uses oral appliance instead of CPAP  . Vertigo   . Chronic kidney disease     kidney cancer- pt states she has elected to not have it treated.  Marland Kitchen PONV (postoperative nausea and vomiting)   . Difficult intubation     told by [facility] that she was hard to intubate 15b yrs ago in [place]- surgery since then no problems  . Adverse effect of general anesthetic     felt paralyzed while receiving anesthesia  . Bipolar 1 disorder   . PTSD (post-traumatic stress disorder)   . rt renal ca dx'd 12/2009    no treatment/ no surg  . Hypertension   . Asthma     as a child   Past Surgical History  Procedure Laterality Date  . Thyroidectomy  1990  . Tubal ligation  1970s  . Nasoseptal reconstruction  1990s  . Cardiac electrophysiology mapping and ablation  2000s  . Tonsillectomy      as a child  . Uterine mass removal  03/2012    was found to be benign   Family History  Problem Relation Age of Onset  . Allergies Father   . Allergies Sister     multiple  . Allergies Brother     multiple  . Allergies Daughter   . Heart disease Mother   . Heart disease Maternal Grandfather   . Colon cancer Paternal Grandmother   . Cancer  Paternal Grandfather   . Skin cancer Father    History  Substance Use Topics  . Smoking status: Never Smoker   . Smokeless tobacco: Never Used  . Alcohol Use: No   OB History   Grav Para Term Preterm Abortions TAB SAB Ect Mult Living                 Review of Systems  Constitutional: Negative for fever and chills.  HENT: Negative.   Eyes: Negative.  Negative for visual disturbance.  Respiratory: Negative.  Negative for cough and shortness of breath.   Cardiovascular: Negative.  Negative for chest pain.  Gastrointestinal: Positive for nausea. Negative for vomiting and abdominal pain.  Genitourinary: Positive for dysuria.  Musculoskeletal: Negative.  Negative for myalgias.  Skin: Negative.   Neurological: Positive for dizziness.  Negative for facial asymmetry, speech difficulty, weakness and light-headedness.  Psychiatric/Behavioral: Positive for confusion, dysphoric mood and decreased concentration.       See HPI.    Allergies  Abilify; Geodon; Lithium; Talwin; and Toradol  Home Medications   Current Outpatient Rx  Name  Route  Sig  Dispense  Refill  . BYSTOLIC 5 MG tablet   Oral   Take 5 mg by mouth daily.         . Cholecalciferol (VITAMIN D) 2000 UNITS CAPS   Oral   Take 1 capsule (2,000 Units total) by mouth daily.   30 capsule   0   . DULoxetine (CYMBALTA) 60 MG capsule   Oral   Take 60 mg by mouth daily.         . fluticasone (FLONASE) 50 MCG/ACT nasal spray   Nasal   Place 50 sprays into the nose 4 (four) times daily.         Marland Kitchen lamoTRIgine (LAMICTAL) 200 MG tablet   Oral   Take 1 tablet (200 mg total) by mouth 2 (two) times daily.   60 tablet   0   . levothyroxine (SYNTHROID, LEVOTHROID) 88 MCG tablet   Oral   Take 88 mcg by mouth daily before breakfast.         . LORazepam (ATIVAN) 1 MG tablet   Oral   Take 1 tablet (1 mg total) by mouth 3 (three) times daily as needed for anxiety.   14 tablet   0   . magnesium gluconate (MAGONATE) 500 MG tablet   Oral   Take 1 tablet (500 mg total) by mouth daily.   30 tablet   0   . rOPINIRole (REQUIP) 0.5 MG tablet      Take 1 tablet late in afternoon and 2 tablets after dinner   90 tablet   0   . traMADol (ULTRAM) 50 MG tablet   Oral   Take 1 tablet by mouth every 6 (six) hours as needed. For migraine headaches         . valsartan-hydrochlorothiazide (DIOVAN-HCT) 320-25 MG per tablet   Oral   Take 1 tablet by mouth daily.   30 tablet   0    BP 135/101  Pulse 71  Temp(Src) 98.2 F (36.8 C) (Oral)  Resp 16  SpO2 96% Physical Exam  Constitutional: She is oriented to person, place, and time. She appears well-developed and well-nourished. No distress.  HENT:  Head: Normocephalic.  Mouth/Throat: Mucous membranes  are dry.  Eyes: Conjunctivae are normal.  Neck: Normal range of motion.  Cardiovascular: Normal rate and regular rhythm.   No murmur heard. Pulmonary/Chest: Effort normal  and breath sounds normal. She has no wheezes. She has no rales.  Abdominal: Soft. There is no tenderness. There is no rebound and no guarding.  Musculoskeletal: Normal range of motion.  Neurological: She is alert and oriented to person, place, and time. Coordination normal.  Skin: Skin is warm and dry.  Psychiatric:  She is tearful throughout HPI and PE.     ED Course  Procedures (including critical care time) Labs Review Labs Reviewed  URINE CULTURE  URINALYSIS, ROUTINE W REFLEX MICROSCOPIC  CBC WITH DIFFERENTIAL  BASIC METABOLIC PANEL   Results for orders placed during the hospital encounter of 04/20/13  URINALYSIS, ROUTINE W REFLEX MICROSCOPIC      Result Value Range   Color, Urine YELLOW  YELLOW   APPearance CLOUDY (*) CLEAR   Specific Gravity, Urine 1.015  1.005 - 1.030   pH 6.5  5.0 - 8.0   Glucose, UA NEGATIVE  NEGATIVE mg/dL   Hgb urine dipstick NEGATIVE  NEGATIVE   Bilirubin Urine NEGATIVE  NEGATIVE   Ketones, ur NEGATIVE  NEGATIVE mg/dL   Protein, ur NEGATIVE  NEGATIVE mg/dL   Urobilinogen, UA 0.2  0.0 - 1.0 mg/dL   Nitrite NEGATIVE  NEGATIVE   Leukocytes, UA MODERATE (*) NEGATIVE  CBC WITH DIFFERENTIAL      Result Value Range   WBC 4.4  4.0 - 10.5 K/uL   RBC 5.01  3.87 - 5.11 MIL/uL   Hemoglobin 14.1  12.0 - 15.0 g/dL   HCT 42.2  36.0 - 46.0 %   MCV 84.2  78.0 - 100.0 fL   MCH 28.1  26.0 - 34.0 pg   MCHC 33.4  30.0 - 36.0 g/dL   RDW 14.5  11.5 - 15.5 %   Platelets 245  150 - 400 K/uL   Neutrophils Relative % 57  43 - 77 %   Neutro Abs 2.5  1.7 - 7.7 K/uL   Lymphocytes Relative 32  12 - 46 %   Lymphs Abs 1.4  0.7 - 4.0 K/uL   Monocytes Relative 9  3 - 12 %   Monocytes Absolute 0.4  0.1 - 1.0 K/uL   Eosinophils Relative 3  0 - 5 %   Eosinophils Absolute 0.1  0.0 - 0.7 K/uL    Basophils Relative 1  0 - 1 %   Basophils Absolute 0.0  0.0 - 0.1 K/uL  BASIC METABOLIC PANEL      Result Value Range   Sodium 140  137 - 147 mEq/L   Potassium 4.6  3.7 - 5.3 mEq/L   Chloride 101  96 - 112 mEq/L   CO2 26  19 - 32 mEq/L   Glucose, Bld 90  70 - 99 mg/dL   BUN 15  6 - 23 mg/dL   Creatinine, Ser 0.75  0.50 - 1.10 mg/dL   Calcium 9.7  8.4 - 10.5 mg/dL   GFR calc non Af Amer 85 (*) >90 mL/min   GFR calc Af Amer >90  >90 mL/min  URINE MICROSCOPIC-ADD ON      Result Value Range   Squamous Epithelial / LPF FEW (*) RARE   WBC, UA 7-10  <3 WBC/hpf   Ct Head Wo Contrast  04/20/2013   CLINICAL DATA:  DIZZINESS  EXAM: CT HEAD WITHOUT CONTRAST  TECHNIQUE: Contiguous axial images were obtained from the base of the skull through the vertex without intravenous contrast.  COMPARISON:  CT HEAD W/O CM dated 07/23/2012  FINDINGS: No acute intracranial hemorrhage.  No focal mass lesion. No CT evidence of acute infarction. No midline shift or mass effect. No hydrocephalus. Basilar cisterns are patent. Paranasal sinuses and mastoid air cells are clear.  IMPRESSION: No acute intracranial findings and unchanged from prior.   Electronically Signed   By: Suzy Bouchard M.D.   On: 04/20/2013 18:25   Imaging Review No results found.  EKG Interpretation    Date/Time:  Friday April 20 2013 15:50:24 EST Ventricular Rate:  65 PR Interval:  139 QRS Duration: 93 QT Interval:  415 QTC Calculation: 431 R Axis:   50 Text Interpretation:  Sinus rhythm No significant change since last tracing Confirmed by WARD  DO, KRISTEN ([id number]) on 04/20/2013 4:31:06 PM            MDM  No diagnosis found. 1. Ataxia   She has symptoms of episodic gait imbalance, difficult word finding, headache and decreased coordination causing concern for TIA. She has risk factors for same (hypertension, renal cancer). Neg Head CT. Will admit for further TIA work up. She is stable with stable VS. She has been evaluated by  Dr. Leonides Schanz. Hospitalist paged for admission.    Dewaine Oats, PA-C 04/20/13 [id number]

## 2013-04-20 NOTE — H&P (Signed)
[facility] History and Physical  [name] DOB: Jan 12, 1946 DOA: 04/20/2013  Referring physician:  PCP:  Melinda Crutch, MD   Chief Complaint: Confusion/feeling "off balance"  HPI: [name] is a 68 y.o. female with a past medical history of bipolar disorder, major depression, hypertension, presenting to the emergency department with complaints of confusion, feeling off balance, dizziness, having increased daytime sleepiness with symptoms worsening over last 2-3 days. She also reports feeling depressed, with feelings of hopelessness, anhedonia, tearfulness and sadness. She also reports having difficulties concentrating, feeling confused with increased forgetfulness, at times having a hard time getting words out or simply dialing a telephone number. She denies unilateral weakness, numbness, recurrent falls, chest pain, shortness of breath, fevers or chills. During my encounter with her in the emergency department she became tearful, stated currently undergoing difficult financial challenges which she feels has led to worsening of depressive symptoms. She denies suicidal or homicidal ideations.                                                                       Review of Systems:  Constitutional:  No weight loss, night sweats, Fevers, chills, fatigue.  HEENT:  No headaches, Difficulty swallowing,Tooth/dental problems,Sore throat,  No sneezing, itching, ear ache, nasal congestion, post nasal drip,  Cardio-vascular:  No chest pain, Orthopnea, PND, swelling in lower extremities, anasarca, dizziness, palpitations  GI:  No heartburn, indigestion, abdominal pain, nausea, vomiting, diarrhea, change in bowel habits, loss of appetite  Resp:  No shortness of breath with exertion or at rest. No excess mucus, no productive cough, No non-productive cough, No coughing up of blood.No change in color of mucus.No wheezing.No chest wall deformity  Skin:  no rash or lesions.   GU:  no dysuria, change in color of urine, no urgency or frequency. No flank pain.  Musculoskeletal:  No joint pain or swelling. No decreased range of motion. No back pain.  Psych:  Positive for increased depression, sadness, hopelessness, difficulties concentrating.   Past Medical History  Diagnosis Date  . Abscess of Bartholin's gland   . History of pleural effusion   . History of attention deficit disorder   . History of endometriosis   . RLS (restless legs syndrome)   . Anxiety   . Obesity   . GERD (gastroesophageal reflux disease)   . Renal mass 02/15/2012  . Dysrhythmia     h/o ventricular tachycardia- ablation resolved it  . Hypothyroidism   . Depression     Bipolar disorder  . OSA (obstructive sleep apnea)     uses oral appliance instead of CPAP  . Vertigo   . Chronic kidney disease     kidney cancer- pt states she has elected to not have it treated.  Marland Kitchen PONV (postoperative nausea and vomiting)   . Difficult intubation     told by [facility] that she was hard to intubate 15b yrs ago in [place]- surgery since then no problems  . Adverse effect of general anesthetic     felt paralyzed while receiving anesthesia  . Bipolar 1 disorder   . PTSD (post-traumatic stress disorder)   . rt renal ca dx'd 12/2009    no treatment/ no surg  . Hypertension   .  Asthma     as a child   Past Surgical History  Procedure Laterality Date  . Thyroidectomy  1990  . Tubal ligation  1970s  . Nasoseptal reconstruction  1990s  . Cardiac electrophysiology mapping and ablation  2000s  . Tonsillectomy      as a child  . Uterine mass removal  03/2012    was found to be benign   Social History:  reports that she has never smoked. She has never used smokeless tobacco. She reports that she does not drink alcohol or use illicit drugs.  Allergies  Allergen Reactions  . Abilify [Aripiprazole] Other (See Comments)    jerking  . Geodon [Ziprasidone Hydrochloride] Other (See Comments)    Extremely  aggitated  . Lithium Nausea Only and Other (See Comments)    Off balance, increased heart rate  . Talwin [Pentazocine] Other (See Comments)    Chest pain  . Toradol [Ketorolac Tromethamine] Other (See Comments)    hallucinations    Family History  Problem Relation Age of Onset  . Allergies Father   . Allergies Sister     multiple  . Allergies Brother     multiple  . Allergies Daughter   . Heart disease Mother   . Heart disease Maternal Grandfather   . Colon cancer Paternal Grandmother   . Cancer Paternal Grandfather   . Skin cancer Father      Prior to Admission medications   Medication Sig Start Date End Date Taking? Authorizing Provider  BYSTOLIC 5 MG tablet Take 5 mg by mouth daily. 11/25/12  Yes Historical Provider, MD  Cholecalciferol (VITAMIN D) 2000 UNITS CAPS Take 1 capsule (2,000 Units total) by mouth daily. 07/05/12  Yes Waylan Boga, NP  DULoxetine (CYMBALTA) 60 MG capsule Take 60 mg by mouth daily.   Yes Historical Provider, MD  fluticasone (FLONASE) 50 MCG/ACT nasal spray Place 50 sprays into the nose 4 (four) times daily. 07/09/12  Yes Historical Provider, MD  lamoTRIgine (LAMICTAL) 200 MG tablet Take 1 tablet (200 mg total) by mouth 2 (two) times daily. 07/05/12  Yes Waylan Boga, NP  levothyroxine (SYNTHROID, LEVOTHROID) 88 MCG tablet Take 88 mcg by mouth daily before breakfast.   Yes Historical Provider, MD  LORazepam (ATIVAN) 1 MG tablet Take 1 tablet (1 mg total) by mouth 3 (three) times daily as needed for anxiety. 07/05/12  Yes Waylan Boga, NP  magnesium gluconate (MAGONATE) 500 MG tablet Take 1 tablet (500 mg total) by mouth daily. 07/05/12  Yes Waylan Boga, NP  rOPINIRole (REQUIP) 0.5 MG tablet Take 1 tablet late in afternoon and 2 tablets after dinner 04/16/13  Yes Kathee Delton, MD  traMADol (ULTRAM) 50 MG tablet Take 1 tablet by mouth every 6 (six) hours as needed. For migraine headaches 11/08/12  Yes Historical Provider, MD  valsartan-hydrochlorothiazide  (DIOVAN-HCT) 320-25 MG per tablet Take 1 tablet by mouth daily. 07/05/12  Yes Waylan Boga, NP   Physical Exam: Filed Vitals:   04/20/13 1824  BP: 119/53  Pulse: 59  Temp:   Resp: 16    BP 119/53  Pulse 59  Temp(Src) 98.2 F (36.8 C) (Oral)  Resp 16  SpO2 100%  General: Patient is tearful, flat affect, presently not suicidal or homicidal  Eyes: PERRL, normal lids, irises & conjunctiva ENT: grossly normal hearing, lips & tongue Neck: no LAD, masses or thyromegaly Cardiovascular: RRR, no m/r/g. No LE edema. Telemetry: SR, no arrhythmias  Respiratory: CTA bilaterally, no w/r/r. Normal respiratory effort.  Abdomen: soft, ntnd Skin: no rash or induration seen on limited exam Musculoskeletal: grossly normal tone BUE/BLE Psychiatric: grossly normal mood and affect, speech fluent and appropriate Neurologic: grossly non-focal.          Labs on Admission:  Basic Metabolic Panel:  Recent Labs Lab 04/20/13 1656  NA 140  K 4.6  CL 101  CO2 26  GLUCOSE 90  BUN 15  CREATININE 0.75  CALCIUM 9.7   Liver Function Tests: No results found for this basename: AST, ALT, ALKPHOS, BILITOT, PROT, ALBUMIN,  in the last 168 hours No results found for this basename: LIPASE, AMYLASE,  in the last 168 hours No results found for this basename: AMMONIA,  in the last 168 hours CBC:  Recent Labs Lab 04/20/13 1656  WBC 4.4  NEUTROABS 2.5  HGB 14.1  HCT 42.2  MCV 84.2  PLT 245   Cardiac Enzymes: No results found for this basename: CKTOTAL, CKMB, CKMBINDEX, TROPONINI,  in the last 168 hours  BNP (last 3 results) No results found for this basename: PROBNP,  in the last 8760 hours CBG: No results found for this basename: GLUCAP,  in the last 168 hours  Radiological Exams on Admission: Ct Head Wo Contrast  04/20/2013   CLINICAL DATA:  DIZZINESS  EXAM: CT HEAD WITHOUT CONTRAST  TECHNIQUE: Contiguous axial images were obtained from the base of the skull through the vertex without  intravenous contrast.  COMPARISON:  CT HEAD W/O CM dated 07/23/2012  FINDINGS: No acute intracranial hemorrhage. No focal mass lesion. No CT evidence of acute infarction. No midline shift or mass effect. No hydrocephalus. Basilar cisterns are patent. Paranasal sinuses and mastoid air cells are clear.  IMPRESSION: No acute intracranial findings and unchanged from prior.   Electronically Signed   By: Suzy Bouchard M.D.   On: 04/20/2013 18:25    EKG: Independently reviewed.   Assessment/Plan Active Problems:   Acute encephalopathy   OBSTRUCTIVE SLEEP APNEA   RESTLESS LEGS SYNDROME   Generalized anxiety disorder   Major depression, recurrent   1. Acute encephalopathy. Patient presenting with complaints of increasing confusion, becoming increasingly forgetful associated with trouble getting words out and difficulties concentrating. She also reports depressive symptoms worsening in the last 3 days. I suspect that overall symptoms are tied to underlying depression, although it is possible that polypharmacy may be contributing as she is on multiple psychotropic medications. Will work her up for possible metabolic causes, check a TSH, serum chemistries, urinalysis. We'll also order an MRI of the brain without contrast. Of note CT scan of brain performed on presentation showed no acute intracranial findings.  2. Major depression. Patient presenting with worsening depressive symptoms the last 2-3 days as she reports having financial issues. Will consult psychiatry in a.m. 3. Hypertension. Will continue to systolic and Diovan therapy. 4. Restless leg syndrome. Continue Requip 5. History of hypothyroidism. Will check a TSH, meanwhile continue home regimen of Synthroid 6. DVT prophylaxis. Lovenox   Code Status: Full code  Disposition Plan: I do not anticipate patient requiring greater than 2 night hospitalization, will place her in overnight obs  Time spent: 55 minutes  Kelvin Cellar [facility] Pager [phone]

## 2013-04-20 NOTE — ED Notes (Addendum)
Pt states that she "has not been feeling well" has been dizzy and off balance, forgetful, head and neck pain x2 days. Pt called PCP and was advised to come to this facility for eval. Pt states that she has "been confused and sleeping 10 hours per day." Pt is A&O and In NAD

## 2013-04-20 NOTE — ED Notes (Signed)
Patient transported to CT 

## 2013-04-20 NOTE — ED Provider Notes (Signed)
Medical screening examination/treatment/procedure(s) were conducted as a shared visit with non-physician practitioner(s) and myself.  I personally evaluated the patient during the encounter.  EKG Interpretation    Date/Time:  Friday April 20 2013 15:50:24 EST Ventricular Rate:  65 PR Interval:  139 QRS Duration: 93 QT Interval:  415 QTC Calculation: 431 R Axis:   50 Text Interpretation:  Sinus rhythm No significant change since last tracing Confirmed by Sameena Artus  DO, Jailen Lung ([id number]) on 04/20/2013 4:31:06 PM            Pt is a 68 y.o. female with a history of hypertension, hyperlipidemia, hypothyroidism, depression, kidney cancer that she's not having treated who presents the emergency department with 2 days of intermittent episodes of difficulty with balance, vertiginous symptoms, difficulty word finding. She reports this morning she was unable to use the phone she could not remember phone numbers or make her hands works and how the numbers. This lasted several minutes and then resolved. She's never had a stroke or TIA in the past. No head injury. She is on anticoagulation. No headache. She's currently neurologically intact. Patient does complain of worsening depression due to her cancer. No SI or HI. Concern her symptoms may be physical manifestations of her depression also she has risk factors for TIA. Will obtain TIA workup. I feel she will need admission to rule out TIA as the cause of her symptoms. Patient agrees with this plan. She denies any chest pain or shortness of breath with any of her symptoms or currently. She is hemodynamically stable.  [place], DO 04/20/13 1742

## 2013-04-20 NOTE — ED Notes (Signed)
Attempted to call report.  Nurse states she is given pain medications

## 2013-04-21 LAB — CBC
HCT: 39.5 % (ref 36.0–46.0)
Hemoglobin: 13 g/dL (ref 12.0–15.0)
MCH: 28 pg (ref 26.0–34.0)
MCHC: 32.9 g/dL (ref 30.0–36.0)
MCV: 84.9 fL (ref 78.0–100.0)
Platelets: 235 10*3/uL (ref 150–400)
RBC: 4.65 MIL/uL (ref 3.87–5.11)
RDW: 14.5 % (ref 11.5–15.5)
WBC: 5.2 10*3/uL (ref 4.0–10.5)

## 2013-04-21 LAB — BASIC METABOLIC PANEL
BUN: 13 mg/dL (ref 6–23)
CO2: 29 mEq/L (ref 19–32)
Calcium: 9.1 mg/dL (ref 8.4–10.5)
Chloride: 102 mEq/L (ref 96–112)
Creatinine, Ser: 0.85 mg/dL (ref 0.50–1.10)
GFR calc Af Amer: 80 mL/min — ABNORMAL LOW (ref >90)
GFR calc non Af Amer: 69 mL/min — ABNORMAL LOW (ref >90)
Glucose, Bld: 114 mg/dL — ABNORMAL HIGH (ref 70–99)
Potassium: 4.2 mEq/L (ref 3.7–5.3)
Sodium: 141 mEq/L (ref 137–147)

## 2013-04-21 LAB — LIPID PANEL
Cholesterol: 167 mg/dL (ref 0–200)
HDL: 44 mg/dL (ref >39)
LDL Cholesterol: 102 mg/dL — ABNORMAL HIGH (ref 0–99)
Total CHOL/HDL Ratio: 3.8 RATIO
Triglycerides: 106 mg/dL (ref <150)
VLDL: 21 mg/dL (ref 0–40)

## 2013-04-21 LAB — TSH: TSH: 2.109 u[IU]/mL (ref 0.350–4.500)

## 2013-04-21 MED ORDER — LAMOTRIGINE 150 MG PO TABS
150.0000 mg | ORAL_TABLET | Freq: Two times a day (BID) | ORAL | Status: DC
  Administered 2013-04-21 – 2013-04-22 (×2): 150 mg via ORAL
  Filled 2013-04-21 (×3): qty 1

## 2013-04-21 MED ORDER — ASPIRIN EC 81 MG PO TBEC
81.0000 mg | DELAYED_RELEASE_TABLET | Freq: Every day | ORAL | Status: DC
Start: 2013-04-21 — End: 2013-04-21

## 2013-04-21 MED ORDER — TRAZODONE HCL 50 MG PO TABS
75.0000 mg | ORAL_TABLET | Freq: Every day | ORAL | Status: DC
  Administered 2013-04-21: 75 mg via ORAL
  Filled 2013-04-21 (×2): qty 1

## 2013-04-21 MED ORDER — ASPIRIN EC 81 MG PO TBEC
81.0000 mg | DELAYED_RELEASE_TABLET | Freq: Every day | ORAL | Status: DC
  Administered 2013-04-22: 81 mg via ORAL
  Filled 2013-04-21: qty 1

## 2013-04-21 MED ORDER — DULOXETINE HCL 30 MG PO CPEP
30.0000 mg | ORAL_CAPSULE | Freq: Every day | ORAL | Status: DC
  Administered 2013-04-22: 30 mg via ORAL
  Filled 2013-04-21: qty 1

## 2013-04-21 MED ORDER — BUPROPION HCL ER (XL) 150 MG PO TB24
150.0000 mg | ORAL_TABLET | Freq: Every day | ORAL | Status: DC
  Administered 2013-04-21 – 2013-04-22 (×2): 150 mg via ORAL
  Filled 2013-04-21 (×2): qty 1

## 2013-04-21 NOTE — Consult Note (Signed)
Reason for Consult: Capacity evaluation Referring Physician: Dr. Doy Frank is an 68 y.o. female.  HPI: Patient was seen and chart reviewed. Psychiatric consultation for increased symptoms of depression with passive suicidal ideation and current medication are not effective. [name] is a 68 y.o. divorced white female with a past medical history of bipolar disorder, major depression, hypertension, admitted to [facility] long medically floor complaints of confusion, feeling off balance, dizziness, having increased daytime sleepiness with symptoms worsening over last 2-3 days. She also reports feeling depressed, with feelings of hopelessness, anhedonia, tearfulness and sadness. She also reports having difficulties concentrating, feeling confused with increased forgetfulness, at times having a hard time getting words out or simply dialing a telephone number. During my evaluation with her she became tearful, stated currently undergoing difficult financial challenges, renal cell carcinoma and having trouble finding psychiatrist since her previous office not accepting Medicare any more, limited support from friends and family which she feels has led to worsening of depressive symptoms. Patient reported she has been compliant with her medication but they're not working any longer. She has passive suicidal ideations and states she wanted to give up her life because she cannot site any longer and feeling tired of being sick but denied homicidal ideations and psychosis. Patient has one daughter who lives in [place] and has not come in patient. 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 Patient has acute psychiatric hospitalization at least twice in the past. [facility].  Review of Systems:  Constitutional:  No weight loss, night sweats, Fevers, chills, fatigue.  HEENT: No headaches, Difficulty  swallowing,Tooth/dental problems,Sore throat,  No sneezing, itching, ear ache, nasal congestion, post nasal drip,  Cardio-vascular: No chest pain, Orthopnea, PND, swelling in lower extremities, anasarca, dizziness, palpitations  GI:No heartburn, indigestion, abdominal pain, nausea, vomiting, diarrhea, change in bowel habits, loss of appetite  Resp: No shortness of breath with exertion or at rest. No excess mucus, no productive cough, No non-productive cough, No coughing up of blood.No change in color of mucus.No wheezing.No chest wall deformity  Skin: no rash or lesions. GU: no dysuria, change in color of urine, no urgency or frequency. No flank pain.  Musculoskeletal: No joint pain or swelling. No decreased range of motion. No back pain.  Psych: Positive for increased depression, sadness, hopelessness, difficulties concentrating.  Mental Status Examination: Patient appeared as per his stated age, and fairly groomed, and has fair eye contact. Patient has decreased psychomotor activity but normal musculoskeletal abnormalities. Patient has depressed mood and her affect was dysphoric and tearful. She has normal rate, rhythm, and no volume of speech. Patient has a good language with out difficulties. She has good fund of knowledge. Her thought process is linear and goal directed. Patient has passive suicidal ideations, but denied homicidal ideations, intentions or plans. Patient has no evidence of auditory or visual hallucinations, delusions, and paranoia. Patient has fair insight judgment and impulse control.  Past Medical History  Diagnosis Date  . Abscess of Bartholin's gland   . History of pleural effusion   . History of attention deficit disorder   . History of endometriosis   . RLS (restless legs syndrome)   . Anxiety   . Obesity   . GERD (gastroesophageal reflux disease)   . Renal mass 02/15/2012  . Dysrhythmia     h/o ventricular tachycardia- ablation resolved it  . Hypothyroidism   .  Depression  Bipolar disorder  . OSA (obstructive sleep apnea)     uses oral appliance instead of CPAP  . Vertigo   . Chronic kidney disease     kidney cancer- pt states she has elected to not have it treated.  Marland Kitchen PONV (postoperative nausea and vomiting)   . Difficult intubation     told by [facility] that she was hard to intubate 15b yrs ago in [place]- surgery since then no problems  . Adverse effect of general anesthetic     felt paralyzed while receiving anesthesia  . Bipolar 1 disorder   . PTSD (post-traumatic stress disorder)   . rt renal ca dx'd 12/2009    no treatment/ no surg  . Hypertension   . Asthma     as a child    Past Surgical History  Procedure Laterality Date  . Thyroidectomy  1990  . Tubal ligation  1970s  . Nasoseptal reconstruction  1990s  . Cardiac electrophysiology mapping and ablation  2000s  . Tonsillectomy      as a child  . Uterine mass removal  03/2012    was found to be benign    Family History  Problem Relation Age of Onset  . Allergies Father   . Allergies Sister     multiple  . Allergies Brother     multiple  . Allergies Daughter   . Heart disease Mother   . Heart disease Maternal Grandfather   . Colon cancer Paternal Grandmother   . Cancer Paternal Grandfather   . Skin cancer Father     Social History:  reports that she has never smoked. She has never used smokeless tobacco. She reports that she does not drink alcohol or use illicit drugs.  Allergies:  Allergies  Allergen Reactions  . Abilify [Aripiprazole] Other (See Comments)    jerking  . Geodon [Ziprasidone Hydrochloride] Other (See Comments)    Extremely aggitated  . Lithium Nausea Only and Other (See Comments)    Off balance, increased heart rate  . Talwin [Pentazocine] Other (See Comments)    Chest pain  . Toradol [Ketorolac Tromethamine] Other (See Comments)    hallucinations    Medications: I have reviewed the patient's current medications.  Results for orders placed  during the hospital encounter of 04/20/13 (from the past 48 hour(s))  CBC WITH DIFFERENTIAL     Status: None   Collection Time    04/20/13  4:56 PM      Result Value Range   WBC 4.4  4.0 - 10.5 K/uL   RBC 5.01  3.87 - 5.11 MIL/uL   Hemoglobin 14.1  12.0 - 15.0 g/dL   HCT 42.2  36.0 - 46.0 %   MCV 84.2  78.0 - 100.0 fL   MCH 28.1  26.0 - 34.0 pg   MCHC 33.4  30.0 - 36.0 g/dL   RDW 14.5  11.5 - 15.5 %   Platelets 245  150 - 400 K/uL   Neutrophils Relative % 57  43 - 77 %   Neutro Abs 2.5  1.7 - 7.7 K/uL   Lymphocytes Relative 32  12 - 46 %   Lymphs Abs 1.4  0.7 - 4.0 K/uL   Monocytes Relative 9  3 - 12 %   Monocytes Absolute 0.4  0.1 - 1.0 K/uL   Eosinophils Relative 3  0 - 5 %   Eosinophils Absolute 0.1  0.0 - 0.7 K/uL   Basophils Relative 1  0 - 1 %  Basophils Absolute 0.0  0.0 - 0.1 K/uL  BASIC METABOLIC PANEL     Status: Abnormal   Collection Time    04/20/13  4:56 PM      Result Value Range   Sodium 140  137 - 147 mEq/L   Potassium 4.6  3.7 - 5.3 mEq/L   Comment: MARKED HEMOLYSIS     HEMOLYSIS AT THIS LEVEL MAY AFFECT RESULT   Chloride 101  96 - 112 mEq/L   CO2 26  19 - 32 mEq/L   Glucose, Bld 90  70 - 99 mg/dL   BUN 15  6 - 23 mg/dL   Creatinine, Ser 0.75  0.50 - 1.10 mg/dL   Calcium 9.7  8.4 - 10.5 mg/dL   GFR calc non Af Amer 85 (*) >90 mL/min   GFR calc Af Amer >90  >90 mL/min   Comment: (NOTE)     The eGFR has been calculated using the CKD EPI equation.     This calculation has not been validated in all clinical situations.     eGFR's persistently <90 mL/min signify possible Chronic Kidney     Disease.  URINALYSIS, ROUTINE W REFLEX MICROSCOPIC     Status: Abnormal   Collection Time    04/20/13  5:49 PM      Result Value Range   Color, Urine YELLOW  YELLOW   APPearance CLOUDY (*) CLEAR   Specific Gravity, Urine 1.015  1.005 - 1.030   pH 6.5  5.0 - 8.0   Glucose, UA NEGATIVE  NEGATIVE mg/dL   Hgb urine dipstick NEGATIVE  NEGATIVE   Bilirubin Urine  NEGATIVE  NEGATIVE   Ketones, ur NEGATIVE  NEGATIVE mg/dL   Protein, ur NEGATIVE  NEGATIVE mg/dL   Urobilinogen, UA 0.2  0.0 - 1.0 mg/dL   Nitrite NEGATIVE  NEGATIVE   Leukocytes, UA MODERATE (*) NEGATIVE  URINE MICROSCOPIC-ADD ON     Status: Abnormal   Collection Time    04/20/13  5:49 PM      Result Value Range   Squamous Epithelial / LPF FEW (*) RARE   WBC, UA 7-10  <3 WBC/hpf  LIPID PANEL     Status: Abnormal   Collection Time    04/21/13  5:30 AM      Result Value Range   Cholesterol 167  0 - 200 mg/dL   Triglycerides 106  <150 mg/dL   HDL 44  >39 mg/dL   Total CHOL/HDL Ratio 3.8     VLDL 21  0 - 40 mg/dL   LDL Cholesterol 102 (*) 0 - 99 mg/dL   Comment:            Total Cholesterol/HDL:CHD Risk     Coronary Heart Disease Risk Table                         Men   Women      1/2 Average Risk   3.4   3.3      Average Risk       5.0   4.4      2 X Average Risk   9.6   7.1      3 X Average Risk  23.4   11.0                Use the calculated Patient Ratio     above and the CHD Risk Table     to determine the patient's CHD Risk.                  ATP III CLASSIFICATION (LDL):      <100     mg/dL   Optimal      100-129  mg/dL   Near or Above                        Optimal      130-159  mg/dL   Borderline      160-189  mg/dL   High      >190     mg/dL   Very High     Performed at [facility]  BASIC METABOLIC PANEL     Status: Abnormal   Collection Time    04/21/13  5:30 AM      Result Value Range   Sodium 141  137 - 147 mEq/L   Potassium 4.2  3.7 - 5.3 mEq/L   Comment: SLIGHT HEMOLYSIS   Chloride 102  96 - 112 mEq/L   CO2 29  19 - 32 mEq/L   Glucose, Bld 114 (*) 70 - 99 mg/dL   BUN 13  6 - 23 mg/dL   Creatinine, Ser 0.85  0.50 - 1.10 mg/dL   Calcium 9.1  8.4 - 10.5 mg/dL   GFR calc non Af Amer 69 (*) >90 mL/min   GFR calc Af Amer 80 (*) >90 mL/min   Comment: (NOTE)     The eGFR has been calculated using the CKD EPI equation.     This calculation has not  been validated in all clinical situations.     eGFR's persistently <90 mL/min signify possible Chronic Kidney     Disease.  CBC     Status: None   Collection Time    04/21/13  5:30 AM      Result Value Range   WBC 5.2  4.0 - 10.5 K/uL   RBC 4.65  3.87 - 5.11 MIL/uL   Hemoglobin 13.0  12.0 - 15.0 g/dL   HCT 39.5  36.0 - 46.0 %   MCV 84.9  78.0 - 100.0 fL   MCH 28.0  26.0 - 34.0 pg   MCHC 32.9  30.0 - 36.0 g/dL   RDW 14.5  11.5 - 15.5 %   Platelets 235  150 - 400 K/uL    Ct Head Wo Contrast  04/20/2013   CLINICAL DATA:  DIZZINESS  EXAM: CT HEAD WITHOUT CONTRAST  TECHNIQUE: Contiguous axial images were obtained from the base of the skull through the vertex without intravenous contrast.  COMPARISON:  CT HEAD W/O CM dated 07/23/2012  FINDINGS: No acute intracranial hemorrhage. No focal mass lesion. No CT evidence of acute infarction. No midline shift or mass effect. No hydrocephalus. Basilar cisterns are patent. Paranasal sinuses and mastoid air cells are clear.  IMPRESSION: No acute intracranial findings and unchanged from prior.   Electronically Signed   By: Stewart  Edmunds M.D.   On: 04/20/2013 18:25   Mr Brain Wo Contrast  04/21/2013   CLINICAL DATA:  Confusion.  Renal cell carcinoma  EXAM: MRI HEAD WITHOUT CONTRAST  TECHNIQUE: Multiplanar, multiecho pulse sequences of the brain and surrounding structures were obtained without intravenous contrast.  COMPARISON:  CT 04/20/2013  FINDINGS: Small white matter hyperintensities in the frontal lobes bilaterally, similar to the MRI of 02/11/2010.  Negative for acute infarct.  Negative for hemorrhage or mass. Brainstem and cerebellum are normal.  Vessels at the base of the brain are patent.  IMPRESSION: No acute abnormality.  No change from 2011 MRI.     Electronically Signed   By: Charles  Clark M.D.   On: 04/21/2013 09:04    Positive for anorexia, anxiety, bad mood, bipolar, depression, learning difficulty and sleep disturbance Blood pressure 130/84,  pulse 68, temperature 98 F (36.7 C), temperature source Oral, resp. rate 16, height 5' 5" (1.651 m), weight 102.422 kg (225 lb 12.8 oz), SpO2 100.00%.   Assessment/Plan: Bipolar disorder, MRE is depression  Recommendation: Patient meet criteria for acute psych hospital when medically cleared and completing the stroke evaluation Refer to psych social service Increase Lamictal 150 mg PO BID / for mood swings Start Trazodone 75 mg PO Qhs for insomnia Decrease Cymbalta 30 mg PO QD / not working Start Wellbutrin XL 150 mg PO Qam / depression Appreciate psych consult and follow up as clinically required   ,JANARDHAHA R. 04/21/2013, 11:56 AM      

## 2013-04-21 NOTE — Progress Notes (Signed)
PROGRESS NOTE  [name] WGN:[id number] DOB: 1945/05/18 DOA: 04/20/2013 PCP:  Melinda Crutch, MD  Assessment/Plan: Acute encephalopathy. Patient presenting with complaints of increasing confusion, becoming increasingly forgetful associated with trouble getting words out and difficulties concentrating. Will complete TIA evaluation, MRI negative. 2D echo and carotid duplex pending. Once workup complete, d/c to [facility] as below.  - continue aspirin Major depression. Patient presenting with worsening depressive symptoms the last 2-3 days as she reports having financial issues. Psychiatry consulted, appreciate input.  - plan for inpatient psychiatric hospitalization once medical workup completed. Anticipate tomorrow.  Hypertension. Will continue to systolic and Diovan therapy.  Restless leg syndrome. Continue Requip  History of hypothyroidism. Will check a TSH, meanwhile continue home regimen of Synthroid   Diet: regular Fluids: none DVT Prophylaxis: Lovenox  Code Status: Full Family Communication: none  Disposition Plan: inpatient, [facility] when ready   Consultants:  Psychiatry  Procedures:  none   Antibiotics - none  HPI/Subjective: - still dizzy at times, very depressed, crying  Objective: Filed Vitals:   04/20/13 1824 04/20/13 1930 04/20/13 2125 04/21/13 0538  BP: 119/53 141/112 153/84 130/84  Pulse: 59 66 61 68  Temp:  98.2 F (36.8 C) 97.7 F (36.5 C) 98 F (36.7 C)  TempSrc:  Oral Oral Oral  Resp: 16 20 20 16   Height:   5\' 5"  (1.651 m)   Weight:   102.422 kg (225 lb 12.8 oz)   SpO2: 100% 100% 100% 100%    Intake/Output Summary (Last 24 hours) at 04/21/13 0849 Last data filed at 04/21/13 0700  Gross per 24 hour  Intake    480 ml  Output    550 ml  Net    -70 ml   Filed Weights   04/20/13 2125  Weight: 102.422 kg (225 lb 12.8 oz)    Exam:   General:  NAD  Cardiovascular: regular rate and rhythm, without MRG  Respiratory: good air movement, clear to  auscultation throughout, no wheezing, ronchi or rales  Abdomen: soft, not tender to palpation, positive bowel sounds  MSK: no peripheral edema  Neuro: non focal  Data Reviewed: Basic Metabolic Panel:  Recent Labs Lab 04/20/13 1656 04/21/13 0530  NA 140 141  K 4.6 4.2  CL 101 102  CO2 26 29  GLUCOSE 90 114*  BUN 15 13  CREATININE 0.75 0.85  CALCIUM 9.7 9.1   CBC:  Recent Labs Lab 04/20/13 1656 04/21/13 0530  WBC 4.4 5.2  NEUTROABS 2.5  --   HGB 14.1 13.0  HCT 42.2 39.5  MCV 84.2 84.9  PLT 245 235   Cardiac Enzymes: No results found for this basename: CKTOTAL, CKMB, CKMBINDEX, TROPONINI,  in the last 168 hours BNP (last 3 results) No results found for this basename: PROBNP,  in the last 8760 hours CBG: No results found for this basename: GLUCAP,  in the last 168 hours  No results found for this or any previous visit (from the past 240 hour(s)).   Studies: Ct Head Wo Contrast  04/20/2013   CLINICAL DATA:  DIZZINESS  EXAM: CT HEAD WITHOUT CONTRAST  TECHNIQUE: Contiguous axial images were obtained from the base of the skull through the vertex without intravenous contrast.  COMPARISON:  CT HEAD W/O CM dated 07/23/2012  FINDINGS: No acute intracranial hemorrhage. No focal mass lesion. No CT evidence of acute infarction. No midline shift or mass effect. No hydrocephalus. Basilar cisterns are patent. Paranasal sinuses and mastoid air cells are clear.  IMPRESSION:  No acute intracranial findings and unchanged from prior.   Electronically Signed   By: Suzy Bouchard M.D.   On: 04/20/2013 18:25    Scheduled Meds: . aspirin  325 mg Oral Daily  . DULoxetine  60 mg Oral Daily  . enoxaparin (LOVENOX) injection  40 mg Subcutaneous Q24H  . irbesartan  300 mg Oral Daily   And  . hydrochlorothiazide  25 mg Oral Daily  . lamoTRIgine  100 mg Oral BID  . levothyroxine  88 mcg Oral QAC breakfast  . nebivolol  5 mg Oral Daily  . pneumococcal 23 valent vaccine  0.5 mL Intramuscular  Tomorrow-1000  . rOPINIRole  0.5 mg Oral TID  . sodium chloride  3 mL Intravenous Q12H   Continuous Infusions:   Active Problems:   OBSTRUCTIVE SLEEP APNEA   RESTLESS LEGS SYNDROME   Generalized anxiety disorder   Major depression, recurrent   Acute encephalopathy  Time spent: [place], MD [facility] Pager [phone]. If 7 PM - 7 AM, please contact night-coverage at [email or web address], password [email or web address] 04/21/2013, 8:49 AM  LOS: 1 day

## 2013-04-21 NOTE — Progress Notes (Signed)
Utilization Review Completed.   Ezabella Teska, RN, BSN Nurse Case Manager  

## 2013-04-21 NOTE — Evaluation (Signed)
Physical Therapy Evaluation Patient Details Name: [name] MRN: [id number] DOB: 05/06/1945 Today's Date: 04/21/2013 Time: 6606-3016 PT Time Calculation (min): 44 min  PT Assessment / Plan / Recommendation History of Present Illness  per H & P ..[name] is a 68 y.o. female with a past medical history of bipolar disorder, major depression, hypertension, presenting to the emergency department with complaints of confusion, feeling off balance, dizziness, having increased daytime sleepiness with symptoms worsening over last 2-3 days. She also reports feeling depressed, with feelings of hopelessness, anhedonia, tearfulness and sadness. She also reports having difficulties concentrating, feeling confused with increased forgetfulness, at times having a hard time getting words out or simply dialing a telephone number. She denies unilateral weakness, numbness, recurrent falls, chest pain, shortness of breath, fevers or chills. During my encounter with her in the emergency department she became tearful, stated currently undergoing difficult financial challenges which she feels has led to worsening of depressive symptoms. She denies suicidal or homicidal ideations.    Clinical Impression  Pt states she is feeling better , still has episodes of "nauseated" however this seems to be in the morning. It was slightly provoked by head turning R/L and more pronounced with head tilt up and down and with bending over even in sitting. These episodes subsided fairly quickly. No nystagmus noted at this time, however feeling there is likely an underlying vestibular issue. Recommend to f/u with OPPT for vestibular to continue to look into history of past episodes, put through further vestibular assessment and testing, and then treatment as well. At this time stable with mobility. Educated to avoid head turning with mobility, slow movements, etc. If she is still here , we will begin her vestibular sessions here,  but not necessary from our standpoint to have them here in the hospital since she is stable this morning with gentle movement patterns.     PT Assessment  Patient needs continued PT services    Follow Up Recommendations  Outpatient PT (recommend f/u with vestibular program at [place] (sheet given to pt, will need order /approval from MD))    Does the patient have the potential to tolerate intense rehabilitation      Barriers to Discharge        Equipment Recommendations  None recommended by PT    Recommendations for Other Services     Frequency Min 3X/week    Precautions / Restrictions Restrictions Weight Bearing Restrictions: No   Pertinent Vitals/Pain No pain       Mobility  Bed Mobility Overal bed mobility: Independent Transfers Overall transfer level: Independent Ambulation/Gait Ambulation/Gait assistance: Supervision Ambulation Distance (Feet): 140 Feet Assistive device: None Gait Pattern/deviations: WFL(Within Functional Limits) Gait velocity: moderate General Gait Details: pt steady with gait and mobility. Did notice with head turning R/L and up / down that pt experienced "feeling not quite right and nauseated" that subsided within 30 seconds. No nystagmus noted, however consistent with reportings each time. Did no have a LOB , did stop and pasue after turning head.     Exercises     PT Diagnosis: Difficulty walking (due to dizziness with certain movements)  PT Problem List: Decreased activity tolerance;Decreased mobility;Decreased safety awareness PT Treatment Interventions: Manual techniques;Therapeutic exercise;Therapeutic activities;Gait training;Neuromuscular re-education;Patient/family education     PT Goals(Current goals can be found in the care plan section) Acute Rehab PT Goals Patient Stated Goal: I am feeling better and would like the dizziness to get better as well. Right now I am feeling  okay.  PT Goal Formulation: With patient Time For Goal  Achievement: 04/28/13 Potential to Achieve Goals: Good  Visit Information  Last PT Received On: 04/21/13 Assistance Needed: +1 History of Present Illness: per H & P ..BRIGITT Frank is a 68 y.o. female with a past medical history of bipolar disorder, major depression, hypertension, presenting to the emergency department with complaints of confusion, feeling off balance, dizziness, having increased daytime sleepiness with symptoms worsening over last 2-3 days. She also reports feeling depressed, with feelings of hopelessness, anhedonia, tearfulness and sadness. She also reports having difficulties concentrating, feeling confused with increased forgetfulness, at times having a hard time getting words out or simply dialing a telephone number. She denies unilateral weakness, numbness, recurrent falls, chest pain, shortness of breath, fevers or chills. During my encounter with her in the emergency department she became tearful, stated currently undergoing difficult financial challenges which she feels has led to worsening of depressive symptoms. She denies suicidal or homicidal ideations.         Prior [place] expects to be discharged to:: Private residence Living Arrangements: Alone Available Help at Discharge: Friend(s) Type of Home: House Home Access: Level entry Home Layout: One [place]: None Prior Function Level of Independence: Independent Comments: drives , not working, independent Corporate investment banker: No difficulties    Cognition  Cognition Arousal/Alertness: Awake/alert Behavior During Therapy: WFL for tasks assessed/performed Overall Cognitive Status: Within Functional Limits for tasks assessed    Extremity/Trunk Assessment Lower Extremity Assessment Lower Extremity Assessment: Overall WFL for tasks assessed   Balance    End of Session PT - End of Session Equipment Utilized During Treatment: Gait belt Activity Tolerance:  Patient tolerated treatment well Patient left: in chair;with chair alarm set Nurse Communication: Mobility status  GP Functional Assessment Tool Used: clincial judgement Functional Limitation: Mobility: Walking and moving around Mobility: Walking and Moving Around Current Status [phone]): At least 1 percent but less than 20 percent impaired, limited or restricted Mobility: Walking and Moving Around Goal Status [phone]): 0 percent impaired, limited or restricted   Kimberlye Dilger, [facility] 04/21/2013, 10:41 AM Clide Dales, PT Pager: [phone] 04/21/2013

## 2013-04-22 DIAGNOSIS — G459 Transient cerebral ischemic attack, unspecified: Secondary | ICD-10-CM

## 2013-04-22 LAB — URINE CULTURE

## 2013-04-22 MED ORDER — LAMOTRIGINE 150 MG PO TABS: 150.0000 mg | ORAL_TABLET | Freq: Two times a day (BID) | ORAL | Status: DC

## 2013-04-22 MED ORDER — BUPROPION HCL ER (XL) 150 MG PO TB24: 150.0000 mg | ORAL_TABLET | Freq: Every day | ORAL | Status: DC

## 2013-04-22 MED ORDER — TRAZODONE 25 MG HALF TABLET: 75.0000 mg | ORAL_TABLET | Freq: Every day | ORAL | Status: DC

## 2013-04-22 MED ORDER — DULOXETINE HCL 30 MG PO CPEP: 30.0000 mg | ORAL_CAPSULE | Freq: Every day | ORAL | Status: DC

## 2013-04-22 MED ORDER — ASPIRIN 81 MG PO TBEC: 81.0000 mg | DELAYED_RELEASE_TABLET | Freq: Every day | ORAL | Status: DC

## 2013-04-22 NOTE — Progress Notes (Signed)
Clinical Social Work Department BRIEF PSYCHOSOCIAL ASSESSMENT 04/22/2013  Patient:  [name], [name]     Account Number:  [id number]     Admit date:  04/20/2013  Clinical Social Worker:  Levie Heritage  Date/Time:  04/22/2013 02:14 PM  Referred by:  Physician  Date Referred:  04/22/2013 Referred for  Other - See comment   Other Referral:   [facility]   Interview type:  Patient Other interview type:    PSYCHOSOCIAL DATA Living Status:  ALONE Admitted from facility:   Level of care:   Primary support name:  Sandy Primary support relationship to patient:  FRIEND Degree of support available:   adequate    CURRENT CONCERNS Current Concerns  Behavioral Health Issues   Other Concerns:    SOCIAL WORK ASSESSMENT / PLAN Per MD, psych MD recommending inpt tx.  MD asked CSW to facilitate admission.    Spoke with Emeline General at [facility].  Per Otila Kluver, no beds available, at this time.  Otila Kluver to review Pt's information and let CSW know if a bed will be available today.    Met with Pt to discuss psych MD's recommendation.    Pt stated that she is aware of the recommendation and that, ultimately, she agrees.  She stated, however, that she requests that she be able to go home, feed her cat, pay her bills and tie up loose ends prior to admission.  Pt explained that she needs to get these things in order before she can focus and be relaxed enough to participate in tx.    CSW and Pt had a long discussion about Pt returning home at d/c and then going to [facility] next week when she's ready vs. Pt going directly to [facility] when a bed is available.  Pt was adamant that she is not suicidal and that she has no intentions of harming herself in anyway.  She jokingly stated, "If someone here wants to take me home for 2 hours, I'll do that."  Pt reiterated that she would not be able to fully participate in tx knowing that she has pressing loose ends that need to be tied up.    CSW and Pt discussed outpt resources, for future  reference. Pt has been to [place], by hx, and has seen at least 3 psychiatrists there.    CSW encouraged Pt to seek individual therapy and Pt stated that she has someone with whom she's received therapy from in the past.  She has a connection with this person and intends to resume tx.    CSW to confer with MD re: Pt's d/c plans.    CSW thanked Pt for her time.   Assessment/plan status:  Psychosocial Support/Ongoing Assessment of Needs Other assessment/ plan:   Information/referral to community resources:   n/a--Pt aware of community resources    PATIENT'S/FAMILY'S RESPONSE TO PLAN OF CARE: Pt was calm, cooperative and pleasant.  Pt was tearful, at times.    Pt agrees that she needs inpt tx for depression and is willing to seek it, however she wants to wait until she has her affairs in order; this is very important to her.    CSW provided emotional support.    Pt thanked CSW for time and assistance.   Bernita Raisin, [place] [facility] [phone]

## 2013-04-22 NOTE — Progress Notes (Signed)
  Echocardiogram 2D Echocardiogram has been performed.  [name] 04/22/2013, 8:38 AM

## 2013-04-22 NOTE — Progress Notes (Signed)
*  PRELIMINARY RESULTS* Vascular Ultrasound Carotid Duplex (Doppler) has been completed.   Findings suggest 1-39% internal carotid artery stenosis bilaterally. The left vertebral artery is patent with antegrade flow. Unable to visualize the right vertebral artery.  04/22/2013 8:24 AM Maudry Mayhew, RVT, RDCS, RDMS

## 2013-04-22 NOTE — Discharge Instructions (Signed)
You were cared for by a hospitalist during your hospital stay. If you have any questions about your discharge medications or the care you received while you were in the hospital after you are discharged, you can call the unit and asked to speak with the hospitalist on call if the hospitalist that took care of you is not available. Once you are discharged, your primary care physician will handle any further medical issues. Please note that NO REFILLS for any discharge medications will be authorized once you are discharged, as it is imperative that you return to your primary care physician (or establish a relationship with a primary care physician if you do not have one) for your aftercare needs so that they can reassess your need for medications and monitor your lab values.     If you do not have a primary care physician, you can call [phone] for a physician referral.  Follow with Primary MD  Brooke Crutch, MD in 1-2 weeks Follow up with [facility] as planned   Get CBC, CMP checked by your doctor and again as further instructed.  Get a 2 view Chest X ray done next visit if you had Pneumonia of Lung problems at the [facility] reviewed and adjusted.  Please request your Prim.MD to go over all Hospital Tests and Procedure/Radiological results at the follow up, please get all Hospital records sent to your Prim MD by signing hospital release before you go home.  Activity: As tolerated with Full fall precautions use walker/cane & assistance as needed  Diet: heart healthy  For Heart failure patients - Check your Weight same time everyday, if you gain over 2 pounds, or you develop in leg swelling, experience more shortness of breath or chest pain, call your Primary MD immediately. Follow Cardiac Low Salt Diet and 1.8 lit/day fluid restriction.  Disposition Home  If you experience worsening of your admission symptoms, develop shortness of breath, life threatening emergency, suicidal or homicidal  thoughts you must seek medical attention immediately by calling 911 or calling your MD immediately  if symptoms less severe.  You Must read complete instructions/literature along with all the possible adverse reactions/side effects for all the Medicines you take and that have been prescribed to you. Take any new Medicines after you have completely understood and accpet all the possible adverse reactions/side effects.   Do not drive and provide baby sitting services if your were admitted for syncope or siezures until you have seen by Primary MD or a Neurologist and advised to do so again.  Do not drive when taking Pain medications.   Do not take more than prescribed Pain, Sleep and Anxiety Medications  Special Instructions: If you have smoked or chewed Tobacco  in the last 2 yrs please stop smoking, stop any regular Alcohol  and or any Recreational drug use.  Wear Seat belts while driving.

## 2013-04-23 ENCOUNTER — Ambulatory Visit: Payer: Self-pay | Admitting: Pulmonary Disease

## 2013-04-23 NOTE — Discharge Summary (Signed)
Physician Discharge Summary  [name] XHB:[id number] DOB: [birth year] DOA: 04/20/2013  PCP:  Brooke Crutch, MD  Admit date: 04/20/2013 Discharge date: 04/23/2013  Time spent: 35 minutes  Recommendations for Outpatient Follow-up:  1. Follow up with [facility] in 2 days  2. Follow up with PCP in 1-2 weeks  Discharge Diagnoses:  Active Problems:   OBSTRUCTIVE SLEEP APNEA   RESTLESS LEGS SYNDROME   Generalized anxiety disorder   Major depression, recurrent   Acute encephalopathy  Discharge Condition: stable  Diet recommendation: regular  Filed Weights   04/20/13 2125  Weight: 102.422 kg (225 lb 12.8 oz)   History of present illness:  [name] is a 68 y.o. female with a past medical history of bipolar disorder, major depression, hypertension, presenting to the emergency department with complaints of confusion, feeling off balance, dizziness, having increased daytime sleepiness with symptoms worsening over last 2-3 days. She also reports feeling depressed, with feelings of hopelessness, anhedonia, tearfulness and sadness. She also reports having difficulties concentrating, feeling confused with increased forgetfulness, at times having a hard time getting words out or simply dialing a telephone number. She denies unilateral weakness, numbness, recurrent falls, chest pain, shortness of breath, fevers or chills. During my encounter with her in the emergency department she became tearful, stated currently undergoing difficult financial challenges which she feels has led to worsening of depressive symptoms. She denies suicidal or homicidal ideations.   Hospital Course:  Acute encephalopathy. Patient presenting with complaints of increasing confusion, becoming increasingly forgetful associated with trouble getting words out and difficulties concentrating. She underwent TIA workup while hospitalized, including MRI, 2D echo and carotid duplex without significant acute findings (full results  as outlined below).  Major depression. Patient presenting with worsening depressive symptoms the last 2-3 days as she reports having financial issues. Psychiatry consulted and recommended inpatient admission to [facility] however patient refused to go there directly and will go home for 2 days prior to accepting admission there. Patient is not suicidal or homicidal and is not a threat to herself or to others and there was no reason for involuntary commitment. I discussed the case with Dr. Louretta Shorten who concurs that IVC is not necessary and she can be discharged home, however when she will present at [facility] she will need to be re-assessed and may or may not be hospitalized. This was explained to the patient and she expressed understanding.  Hypertension. Will continue to systolic and Diovan therapy.  Restless leg syndrome. Continue Requip  History of hypothyroidism. - continue home regimen of Synthroid. TSH normal.   Procedures:  2D echo Study Conclusions - Left ventricle: The cavity size was normal. Systolic function was normal. The estimated ejection fraction was in the range of 55% to 60%. Wall motion was normal; there were no regional wall motion abnormalities. Left ventricular diastolic function parameters were normal. Pulmonary arteries: PA peak pressure: 45mm Hg (S).   Carotid doppler Findings suggest 1-39% internal carotid artery stenosis bilaterally. The left vertebral artery is patent with antegrade flow. Unable to visualize the right vertebral artery.   Consultations:  Psychiatry   Discharge Exam: Filed Vitals:   04/21/13 1255 04/21/13 2039 04/22/13 0502 04/22/13 1300  BP: 141/74 145/76 137/81 112/67  Pulse: 68 66 82 79  Temp: 98.2 F (36.8 C) 98.1 F (36.7 C) 97.5 F (36.4 C) 98.3 F (36.8 C)  TempSrc: Oral Oral Oral Oral  Resp: 18 19 18 20   Height:      Weight:  SpO2: 98% 95% 99% 99%   General: NAD Cardiovascular: RRR Respiratory: CTA biL  Discharge Instructions    Future Appointments Provider Department Dept Phone   04/24/2013 9:30 AM [name], [name] [phone]   06/06/2013 9:15 AM Brooke Margarita, MD [place] [phone]   07/24/2013 9:00 AM [facility] Only [facility] [phone]   07/24/2013 9:30 AM Brooke Portela, MD [facility] [phone]       Medication List         aspirin 81 MG EC tablet  Take 1 tablet (81 mg total) by mouth daily.     buPROPion 150 MG 24 hr tablet  Commonly known as:  WELLBUTRIN XL  Take 1 tablet (150 mg total) by mouth daily.     BYSTOLIC 5 MG tablet  Generic drug:  nebivolol  Take 5 mg by mouth daily.     DULoxetine 30 MG capsule  Commonly known as:  CYMBALTA  Take 1 capsule (30 mg total) by mouth daily.     fluticasone 50 MCG/ACT nasal spray  Commonly known as:  FLONASE  Place 50 sprays into the nose 4 (four) times daily.     lamoTRIgine 150 MG tablet  Commonly known as:  LAMICTAL  Take 1 tablet (150 mg total) by mouth 2 (two) times daily.     levothyroxine 88 MCG tablet  Commonly known as:  SYNTHROID, LEVOTHROID  Take 88 mcg by mouth daily before breakfast.     LORazepam 1 MG tablet  Commonly known as:  ATIVAN  Take 1 tablet (1 mg total) by mouth 3 (three) times daily as needed for anxiety.     magnesium gluconate 500 MG tablet  Commonly known as:  MAGONATE  Take 1 tablet (500 mg total) by mouth daily.     rOPINIRole 0.5 MG tablet  Commonly known as:  REQUIP  Take 1 tablet late in afternoon and 2 tablets after dinner     traMADol 50 MG tablet  Commonly known as:  ULTRAM  Take 1 tablet by mouth every 6 (six) hours as needed. For migraine headaches     traZODone 25 mg Tabs tablet  Commonly known as:  DESYREL  Take 1.5 tablets (75 mg total) by mouth at bedtime.     valsartan-hydrochlorothiazide 320-25 MG per tablet  Commonly known as:  DIOVAN-HCT  Take  1 tablet by mouth daily.     Vitamin D 2000 UNITS Caps  Take 1 capsule (2,000 Units total) by mouth daily.           Follow-up Information   Follow up with  Brooke Crutch, MD. Schedule an appointment as soon as possible for a visit in 2 weeks.   Specialty:  Family Medicine   Contact information:   [id number] [place] [address] [phone]       The results of significant diagnostics from this hospitalization (including imaging, microbiology, ancillary and laboratory) are listed below for reference.    Significant Diagnostic Studies: Ct Head Wo Contrast  04/20/2013   CLINICAL DATA:  DIZZINESS  EXAM: CT HEAD WITHOUT CONTRAST  TECHNIQUE: Contiguous axial images were obtained from the base of the skull through the vertex without intravenous contrast.  COMPARISON:  CT HEAD W/O CM dated 07/23/2012  FINDINGS: No acute intracranial hemorrhage. No focal mass lesion. No CT evidence of acute infarction. No midline shift or mass effect. No  hydrocephalus. Basilar cisterns are patent. Paranasal sinuses and mastoid air cells are clear.  IMPRESSION: No acute intracranial findings and unchanged from prior.   Electronically Signed   By: Suzy Bouchard M.D.   On: 04/20/2013 18:25   Mr Brain Wo Contrast  04/21/2013   CLINICAL DATA:  Confusion.  Renal cell carcinoma  EXAM: MRI HEAD WITHOUT CONTRAST  TECHNIQUE: Multiplanar, multiecho pulse sequences of the brain and surrounding structures were obtained without intravenous contrast.  COMPARISON:  CT 04/20/2013  FINDINGS: Small white matter hyperintensities in the frontal lobes bilaterally, similar to the MRI of 02/11/2010.  Negative for acute infarct.  Negative for hemorrhage or mass. Brainstem and cerebellum are normal.  Vessels at the base of the brain are patent.  IMPRESSION: No acute abnormality.  No change from 2011 MRI.   Electronically Signed   By: Franchot Gallo M.D.   On: 04/21/2013 09:04   Microbiology: Recent Results (from the past 240  hour(s))  URINE CULTURE     Status: None   Collection Time    04/20/13  5:49 PM      Result Value Range Status   Specimen Description URINE, CLEAN CATCH   Final   Special Requests NONE   Final   Culture  Setup Time     Final   Value: 04/21/2013 03:01     Performed at [facility]     Final   Value: 20,OOO COLONIES/ML     Performed at [facility]   Culture     Final   Value: Multiple bacterial morphotypes present, none predominant. Suggest appropriate recollection if clinically indicated.     Performed at [facility]   Report Status 04/22/2013 FINAL   Final   Labs: Basic Metabolic Panel:  Recent Labs Lab 04/20/13 1656 04/21/13 0530  NA 140 141  K 4.6 4.2  CL 101 102  CO2 26 29  GLUCOSE 90 114*  BUN 15 13  CREATININE 0.75 0.85  CALCIUM 9.7 9.1   CBC:  Recent Labs Lab 04/20/13 1656 04/21/13 0530  WBC 4.4 5.2  NEUTROABS 2.5  --   HGB 14.1 13.0  HCT 42.2 39.5  MCV 84.2 84.9  PLT 245 235    Signed:  Rohen Kimes  [facility] 04/23/2013, 1:54 PM

## 2013-04-24 ENCOUNTER — Ambulatory Visit: Payer: Self-pay | Admitting: Dietician

## 2013-04-26 ENCOUNTER — Inpatient Hospital Stay (HOSPITAL_COMMUNITY)
Admission: AD | Admit: 2013-04-26 | Discharge: 2013-05-08 | DRG: 885 | Disposition: A | Discharge status: Home / Self Care | Payer: Medicare Other | Source: Intra-hospital | Attending: Psychiatry | Admitting: Psychiatry

## 2013-04-26 ENCOUNTER — Emergency Department (HOSPITAL_COMMUNITY)
Admission: EM | Admit: 2013-04-26 | Discharge: 2013-04-26 | Disposition: A | Discharge status: Home / Self Care | Payer: Medicare Other | Attending: Psychiatry | Admitting: Psychiatry

## 2013-04-26 ENCOUNTER — Encounter (HOSPITAL_COMMUNITY): Payer: Self-pay | Admitting: Emergency Medicine

## 2013-04-26 ENCOUNTER — Encounter (HOSPITAL_COMMUNITY): Payer: Self-pay

## 2013-04-26 DIAGNOSIS — Z8742 Personal history of other diseases of the female genital tract: Secondary | ICD-10-CM | POA: Insufficient documentation

## 2013-04-26 DIAGNOSIS — R42 Dizziness and giddiness: Secondary | ICD-10-CM | POA: Insufficient documentation

## 2013-04-26 DIAGNOSIS — G2581 Restless legs syndrome: Secondary | ICD-10-CM | POA: Diagnosis present

## 2013-04-26 DIAGNOSIS — Z79899 Other long term (current) drug therapy: Secondary | ICD-10-CM | POA: Insufficient documentation

## 2013-04-26 DIAGNOSIS — J45909 Unspecified asthma, uncomplicated: Secondary | ICD-10-CM | POA: Insufficient documentation

## 2013-04-26 DIAGNOSIS — F329 Major depressive disorder, single episode, unspecified: Secondary | ICD-10-CM | POA: Insufficient documentation

## 2013-04-26 DIAGNOSIS — K5900 Constipation, unspecified: Secondary | ICD-10-CM | POA: Diagnosis present

## 2013-04-26 DIAGNOSIS — G4733 Obstructive sleep apnea (adult) (pediatric): Secondary | ICD-10-CM | POA: Diagnosis present

## 2013-04-26 DIAGNOSIS — R6889 Other general symptoms and signs: Secondary | ICD-10-CM | POA: Insufficient documentation

## 2013-04-26 DIAGNOSIS — Z808 Family history of malignant neoplasm of other organs or systems: Secondary | ICD-10-CM

## 2013-04-26 DIAGNOSIS — Z800 Family history of malignant neoplasm of digestive organs: Secondary | ICD-10-CM | POA: Diagnosis not present

## 2013-04-26 DIAGNOSIS — I10 Essential (primary) hypertension: Secondary | ICD-10-CM | POA: Insufficient documentation

## 2013-04-26 DIAGNOSIS — F319 Bipolar disorder, unspecified: Secondary | ICD-10-CM | POA: Insufficient documentation

## 2013-04-26 DIAGNOSIS — F411 Generalized anxiety disorder: Secondary | ICD-10-CM | POA: Insufficient documentation

## 2013-04-26 DIAGNOSIS — I129 Hypertensive chronic kidney disease with stage 1 through stage 4 chronic kidney disease, or unspecified chronic kidney disease: Secondary | ICD-10-CM | POA: Diagnosis present

## 2013-04-26 DIAGNOSIS — F909 Attention-deficit hyperactivity disorder, unspecified type: Secondary | ICD-10-CM | POA: Diagnosis present

## 2013-04-26 DIAGNOSIS — Z8249 Family history of ischemic heart disease and other diseases of the circulatory system: Secondary | ICD-10-CM

## 2013-04-26 DIAGNOSIS — K219 Gastro-esophageal reflux disease without esophagitis: Secondary | ICD-10-CM | POA: Diagnosis present

## 2013-04-26 DIAGNOSIS — Z85528 Personal history of other malignant neoplasm of kidney: Secondary | ICD-10-CM | POA: Diagnosis not present

## 2013-04-26 DIAGNOSIS — E669 Obesity, unspecified: Secondary | ICD-10-CM | POA: Insufficient documentation

## 2013-04-26 DIAGNOSIS — R4583 Excessive crying of child, adolescent or adult: Secondary | ICD-10-CM | POA: Insufficient documentation

## 2013-04-26 DIAGNOSIS — N189 Chronic kidney disease, unspecified: Secondary | ICD-10-CM | POA: Diagnosis present

## 2013-04-26 DIAGNOSIS — E039 Hypothyroidism, unspecified: Secondary | ICD-10-CM | POA: Diagnosis present

## 2013-04-26 DIAGNOSIS — F4310 Post-traumatic stress disorder, unspecified: Secondary | ICD-10-CM | POA: Diagnosis present

## 2013-04-26 DIAGNOSIS — F1994 Other psychoactive substance use, unspecified with psychoactive substance-induced mood disorder: Secondary | ICD-10-CM | POA: Diagnosis present

## 2013-04-26 DIAGNOSIS — G4710 Hypersomnia, unspecified: Secondary | ICD-10-CM | POA: Diagnosis present

## 2013-04-26 DIAGNOSIS — F3289 Other specified depressive episodes: Secondary | ICD-10-CM | POA: Insufficient documentation

## 2013-04-26 DIAGNOSIS — G4700 Insomnia, unspecified: Secondary | ICD-10-CM | POA: Diagnosis present

## 2013-04-26 DIAGNOSIS — R110 Nausea: Secondary | ICD-10-CM | POA: Insufficient documentation

## 2013-04-26 DIAGNOSIS — Z7982 Long term (current) use of aspirin: Secondary | ICD-10-CM | POA: Insufficient documentation

## 2013-04-26 DIAGNOSIS — IMO0002 Reserved for concepts with insufficient information to code with codable children: Secondary | ICD-10-CM | POA: Insufficient documentation

## 2013-04-26 DIAGNOSIS — F332 Major depressive disorder, recurrent severe without psychotic features: Secondary | ICD-10-CM | POA: Diagnosis present

## 2013-04-26 DIAGNOSIS — Z87448 Personal history of other diseases of urinary system: Secondary | ICD-10-CM | POA: Insufficient documentation

## 2013-04-26 DIAGNOSIS — F988 Other specified behavioral and emotional disorders with onset usually occurring in childhood and adolescence: Secondary | ICD-10-CM

## 2013-04-26 DIAGNOSIS — F32A Depression, unspecified: Secondary | ICD-10-CM

## 2013-04-26 LAB — CBC WITH DIFFERENTIAL/PLATELET
Basophils Absolute: 0 10*3/uL (ref 0.0–0.1)
Basophils Relative: 0 % (ref 0–1)
Eosinophils Absolute: 0.1 10*3/uL (ref 0.0–0.7)
Eosinophils Relative: 2 % (ref 0–5)
HCT: 43.3 % (ref 36.0–46.0)
Hemoglobin: 14.5 g/dL (ref 12.0–15.0)
Lymphocytes Relative: 28 % (ref 12–46)
Lymphs Abs: 1.6 10*3/uL (ref 0.7–4.0)
MCH: 28 pg (ref 26.0–34.0)
MCHC: 33.5 g/dL (ref 30.0–36.0)
MCV: 83.6 fL (ref 78.0–100.0)
Monocytes Absolute: 0.4 10*3/uL (ref 0.1–1.0)
Monocytes Relative: 7 % (ref 3–12)
Neutro Abs: 3.6 10*3/uL (ref 1.7–7.7)
Neutrophils Relative %: 63 % (ref 43–77)
Platelets: 280 10*3/uL (ref 150–400)
RBC: 5.18 MIL/uL — ABNORMAL HIGH (ref 3.87–5.11)
RDW: 14 % (ref 11.5–15.5)
WBC: 5.7 10*3/uL (ref 4.0–10.5)

## 2013-04-26 LAB — RAPID URINE DRUG SCREEN, HOSP PERFORMED
Amphetamines: NOT DETECTED
Barbiturates: NOT DETECTED
Benzodiazepines: NOT DETECTED
Cocaine: NOT DETECTED
Opiates: NOT DETECTED
Tetrahydrocannabinol: NOT DETECTED

## 2013-04-26 LAB — ETHANOL: Alcohol, Ethyl (B): <11 mg/dL (ref 0–11)

## 2013-04-26 LAB — BASIC METABOLIC PANEL
BUN: 16 mg/dL (ref 6–23)
CO2: 27 mEq/L (ref 19–32)
Calcium: 10.8 mg/dL — ABNORMAL HIGH (ref 8.4–10.5)
Chloride: 97 mEq/L (ref 96–112)
Creatinine, Ser: 0.79 mg/dL (ref 0.50–1.10)
GFR calc Af Amer: >90 mL/min (ref >90)
GFR calc non Af Amer: 84 mL/min — ABNORMAL LOW (ref >90)
Glucose, Bld: 102 mg/dL — ABNORMAL HIGH (ref 70–99)
Potassium: 4.1 mEq/L (ref 3.7–5.3)
Sodium: 138 mEq/L (ref 137–147)

## 2013-04-26 MED ORDER — MAGNESIUM HYDROXIDE 400 MG/5ML PO SUSP
30.0000 mL | Freq: Every day | ORAL | Status: DC | PRN
  Administered 2013-04-30 – 2013-05-01 (×2): 30 mL via ORAL

## 2013-04-26 MED ORDER — LAMOTRIGINE 150 MG PO TABS
150.0000 mg | ORAL_TABLET | Freq: Two times a day (BID) | ORAL | Status: DC
  Administered 2013-04-27 – 2013-05-08 (×24): 150 mg via ORAL
  Filled 2013-04-26 (×2): qty 1
  Filled 2013-04-26: qty 8
  Filled 2013-04-26 (×17): qty 1
  Filled 2013-04-26: qty 8
  Filled 2013-04-26 (×6): qty 1

## 2013-04-26 MED ORDER — ASPIRIN EC 81 MG PO TBEC
81.0000 mg | DELAYED_RELEASE_TABLET | Freq: Every day | ORAL | Status: DC
  Administered 2013-04-27 – 2013-05-08 (×12): 81 mg via ORAL
  Filled 2013-04-26 (×16): qty 1

## 2013-04-26 MED ORDER — LORAZEPAM 0.5 MG PO TABS
0.5000 mg | ORAL_TABLET | Freq: Once | ORAL | Status: AC
  Administered 2013-04-26: 0.5 mg via ORAL
  Filled 2013-04-26: qty 1

## 2013-04-26 MED ORDER — HYDROCHLOROTHIAZIDE 25 MG PO TABS
25.0000 mg | ORAL_TABLET | Freq: Every day | ORAL | Status: DC
  Administered 2013-04-27 – 2013-05-08 (×12): 25 mg via ORAL
  Filled 2013-04-26 (×15): qty 1

## 2013-04-26 MED ORDER — IRBESARTAN 300 MG PO TABS
300.0000 mg | ORAL_TABLET | Freq: Every day | ORAL | Status: DC
  Administered 2013-04-27 – 2013-05-08 (×12): 300 mg via ORAL
  Filled 2013-04-26 (×10): qty 1
  Filled 2013-04-26: qty 4
  Filled 2013-04-26 (×4): qty 1

## 2013-04-26 MED ORDER — TRAZODONE HCL 50 MG PO TABS
50.0000 mg | ORAL_TABLET | Freq: Every evening | ORAL | Status: DC | PRN
  Administered 2013-04-27: 50 mg via ORAL
  Filled 2013-04-26: qty 1

## 2013-04-26 MED ORDER — ROPINIROLE HCL 0.5 MG PO TABS
0.5000 mg | ORAL_TABLET | Freq: Two times a day (BID) | ORAL | Status: DC
  Filled 2013-04-26 (×3): qty 2

## 2013-04-26 MED ORDER — BUPROPION HCL ER (XL) 150 MG PO TB24
150.0000 mg | ORAL_TABLET | Freq: Every day | ORAL | Status: DC
  Administered 2013-04-27: 150 mg via ORAL
  Filled 2013-04-26 (×4): qty 1

## 2013-04-26 MED ORDER — ALUM & MAG HYDROXIDE-SIMETH 200-200-20 MG/5ML PO SUSP
30.0000 mL | ORAL | Status: DC | PRN
  Administered 2013-05-03 – 2013-05-07 (×3): 30 mL via ORAL

## 2013-04-26 MED ORDER — VALSARTAN-HYDROCHLOROTHIAZIDE 320-25 MG PO TABS: 1.0000 | ORAL_TABLET | Freq: Every day | ORAL | Status: DC

## 2013-04-26 MED ORDER — LEVOTHYROXINE SODIUM 88 MCG PO TABS
88.0000 ug | ORAL_TABLET | Freq: Every day | ORAL | Status: DC
  Administered 2013-04-27 – 2013-05-08 (×12): 88 ug via ORAL
  Filled 2013-04-26 (×15): qty 1

## 2013-04-26 MED ORDER — DULOXETINE HCL 30 MG PO CPEP
30.0000 mg | ORAL_CAPSULE | Freq: Every day | ORAL | Status: DC
  Administered 2013-04-27: 30 mg via ORAL
  Filled 2013-04-26 (×4): qty 1

## 2013-04-26 MED ORDER — ACETAMINOPHEN 325 MG PO TABS
650.0000 mg | ORAL_TABLET | Freq: Four times a day (QID) | ORAL | Status: DC | PRN
  Administered 2013-04-27 – 2013-04-30 (×2): 650 mg via ORAL
  Filled 2013-04-26 (×2): qty 2

## 2013-04-26 MED ORDER — NEBIVOLOL HCL 2.5 MG PO TABS
2.5000 mg | ORAL_TABLET | Freq: Every day | ORAL | Status: DC
  Administered 2013-04-27 – 2013-05-08 (×12): 2.5 mg via ORAL
  Filled 2013-04-26 (×14): qty 1

## 2013-04-26 NOTE — ED Notes (Signed)
Pt states she has hx of bipolar disorder which she states has got worse in last month, states onset worsening was 04/20/2013 when she was admitted for stroke-like symptoms, strokes was ruled out and was advised that the symptoms were psychiatric, she was referred to a psychiatrist who changed her medication with no improvement, she was advised to go to [facility] and is here for medical clearance.

## 2013-04-26 NOTE — Tx Team (Signed)
Initial Interdisciplinary Treatment Plan  PATIENT STRENGTHS: (choose at least two) Ability for insight Average or above average intelligence Capable of independent living General fund of knowledge Motivation for treatment/growth  PATIENT STRESSORS: Health problems Medication change or noncompliance   PROBLEM LIST: Problem List/Patient Goals Date to be addressed Date deferred Reason deferred Estimated date of resolution  Depression                                                       DISCHARGE CRITERIA:  Ability to meet basic life and health needs Improved stabilization in mood, thinking, and/or behavior Reduction of life-threatening or endangering symptoms to within safe limits Verbal commitment to aftercare and medication compliance  PRELIMINARY DISCHARGE PLAN: Attend aftercare/continuing care group Outpatient therapy Return to previous living arrangement  PATIENT/FAMIILY INVOLVEMENT: This treatment plan has been presented to and reviewed with the patient, [name], and/or family member, .  The patient and family have been given the opportunity to ask questions and make suggestions.  [name] 04/26/2013, 11:59 PM
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
## 2013-04-26 NOTE — ED Provider Notes (Signed)
CSN: [id number]     Arrival date & time 04/26/13  1621 History   First MD Initiated Contact with Patient 04/26/13 1709     Chief Complaint  Patient presents with  . Depression    HPI Pt was seen at 1710. Per pt, c/o gradual onset and worsening of persistent depression for the past several months, worse over the past several weeks. Pt was recently hospitalized for "stroke like symptoms" and dx with depression. Pt was offered [facility] admission at that time, but declined stating she would be back in 2 days for admission. Pt presents now for [facility] admission. States she went to [facility], then was sent to the ED. Pt describes her symptoms as: dizziness, nausea, sleeping most of the day, crying a lot, anhedonia, "can't focus on anything." States she "just can't function anymore" because of her depression. Denies SI, no SA, no HI, no hallucinations.     Past Medical History  Diagnosis Date  . Abscess of Bartholin's gland   . History of pleural effusion   . History of attention deficit disorder   . History of endometriosis   . RLS (restless legs syndrome)   . Anxiety   . Obesity   . GERD (gastroesophageal reflux disease)   . Renal mass 02/15/2012  . Dysrhythmia     h/o ventricular tachycardia- ablation resolved it  . Hypothyroidism   . Depression     Bipolar disorder  . OSA (obstructive sleep apnea)     uses oral appliance instead of CPAP  . Vertigo   . Chronic kidney disease     kidney cancer- pt states she has elected to not have it treated.  Marland Kitchen PONV (postoperative nausea and vomiting)   . Difficult intubation     told by [facility] that she was hard to intubate 15b yrs ago in [place]- surgery since then no problems  . Adverse effect of general anesthetic     felt paralyzed while receiving anesthesia  . Bipolar 1 disorder   . PTSD (post-traumatic stress disorder)   . rt renal ca dx'd 12/2009    no treatment/ no surg  . Hypertension   . Asthma     as a child   Past Surgical History  Procedure Laterality  Date  . Thyroidectomy  1990  . Tubal ligation  1970s  . Nasoseptal reconstruction  1990s  . Cardiac electrophysiology mapping and ablation  2000s  . Tonsillectomy      as a child  . Uterine mass removal  03/2012    was found to be benign   Family History  Problem Relation Age of Onset  . Allergies Father   . Allergies Sister     multiple  . Allergies Brother     multiple  . Allergies Daughter   . Heart disease Mother   . Heart disease Maternal Grandfather   . Colon cancer Paternal Grandmother   . Cancer Paternal Grandfather   . Skin cancer Father    History  Substance Use Topics  . Smoking status: Never Smoker   . Smokeless tobacco: Never Used  . Alcohol Use: No    Review of Systems ROS: Statement: All systems negative except as marked or noted in the HPI; Constitutional: Negative for fever and chills. ; ; Eyes: Negative for eye pain, redness and discharge. ; ; ENMT: Negative for ear pain, hoarseness, nasal congestion, sinus pressure and sore throat. ; ; Cardiovascular: Negative for chest pain, palpitations, diaphoresis, dyspnea and peripheral edema. ; ;  Respiratory: Negative for cough, wheezing and stridor. ; ; Gastrointestinal: Negative for vomiting, diarrhea, abdominal pain, blood in stool, hematemesis, jaundice and rectal bleeding. . ; ; Genitourinary: Negative for dysuria, flank pain and hematuria. ; ; Musculoskeletal: Negative for back pain and neck pain. Negative for swelling and trauma.; ; Skin: Negative for pruritus, rash, abrasions, blisters, bruising and skin lesion.; ; Neuro: Negative for headache, lightheadedness and neck stiffness. Negative for weakness, altered level of consciousness , altered mental status, extremity weakness, paresthesias, involuntary movement, seizure and syncope.; Psych:  +depression. No SI, no SA, no HI, no hallucinations.      Allergies  Abilify; Geodon; Lithium; Talwin; and Toradol  Home Medications   Current Outpatient Rx  Name  Route   Sig  Dispense  Refill  . aspirin EC 81 MG EC tablet   Oral   Take 1 tablet (81 mg total) by mouth daily.   30 tablet   1   . buPROPion (WELLBUTRIN XL) 150 MG 24 hr tablet   Oral   Take 1 tablet (150 mg total) by mouth daily.   30 tablet   1   . Cholecalciferol (VITAMIN D) 2000 UNITS CAPS   Oral   Take 1 capsule (2,000 Units total) by mouth daily.   30 capsule   0   . DULoxetine (CYMBALTA) 30 MG capsule   Oral   Take 1 capsule (30 mg total) by mouth daily.   30 capsule   1   . fluticasone (FLONASE) 50 MCG/ACT nasal spray   Nasal   Place 1 spray into the nose as needed for allergies.          Marland Kitchen lamoTRIgine (LAMICTAL) 150 MG tablet   Oral   Take 1 tablet (150 mg total) by mouth 2 (two) times daily.   60 tablet   1   . levothyroxine (SYNTHROID, LEVOTHROID) 88 MCG tablet   Oral   Take 88 mcg by mouth daily before breakfast.         . LORazepam (ATIVAN) 1 MG tablet   Oral   Take 1 tablet (1 mg total) by mouth 3 (three) times daily as needed for anxiety.   14 tablet   0   . magnesium gluconate (MAGONATE) 500 MG tablet   Oral   Take 1 tablet (500 mg total) by mouth daily.   30 tablet   0   . nebivolol (BYSTOLIC) 2.5 MG tablet   Oral   Take 2.5 mg by mouth daily.         Marland Kitchen rOPINIRole (REQUIP) 0.5 MG tablet   Oral   Take 0.5-1 mg by mouth 2 (two) times daily. 0.5mg  at dinner and 2 at bedtime         . traMADol (ULTRAM) 50 MG tablet   Oral   Take 50 mg by mouth every 6 (six) hours as needed (headaches).         . traZODone (DESYREL) 50 MG tablet   Oral   Take 75 mg by mouth at bedtime.         . valsartan-hydrochlorothiazide (DIOVAN-HCT) 320-25 MG per tablet   Oral   Take 1 tablet by mouth daily.   30 tablet   0    BP 117/80  Pulse 80  Temp(Src) 97.8 F (36.6 C) (Oral)  Resp 17  SpO2 96% Physical Exam 1715: Physical examination:  Nursing notes reviewed; Vital signs and O2 SAT reviewed;  Constitutional: Well developed, Well nourished,  Well hydrated, In  no acute distress; Head:  Normocephalic, atraumatic; Eyes: EOMI, PERRL, No scleral icterus; ENMT: Mouth and pharynx normal, Mucous membranes moist; Neck: Supple, Full range of motion, No lymphadenopathy; Cardiovascular: Regular rate and rhythm, No murmur, rub, or gallop; Respiratory: Breath sounds clear & equal bilaterally, No rales, rhonchi, wheezes.  Speaking full sentences with ease, Normal respiratory effort/excursion; Chest: Nontender, Movement normal; Abdomen: Soft, Nontender, Nondistended, Normal bowel sounds; Genitourinary: No CVA tenderness; Extremities: Pulses normal, No tenderness, No edema, No calf edema or asymmetry.; Neuro: AA&Ox3, Major CN grossly intact.  Speech clear. No gross focal motor or sensory deficits in extremities. Climbs on and off chair at bedside easily by herself. Gait steady.; Skin: Color normal, Warm, Dry.; Psych:  Flat affect, poor eye contact. Tearful at times. Denies SI.    ED Course  Procedures    EKG Interpretation   None       MDM  MDM Reviewed: previous chart, nursing note and vitals Reviewed previous: labs Interpretation: labs     Results for orders placed during the hospital encounter of 04/26/13  CBC WITH DIFFERENTIAL      Result Value Range   WBC 5.7  4.0 - 10.5 K/uL   RBC 5.18 (*) 3.87 - 5.11 MIL/uL   Hemoglobin 14.5  12.0 - 15.0 g/dL   HCT 43.3  36.0 - 46.0 %   MCV 83.6  78.0 - 100.0 fL   MCH 28.0  26.0 - 34.0 pg   MCHC 33.5  30.0 - 36.0 g/dL   RDW 14.0  11.5 - 15.5 %   Platelets 280  150 - 400 K/uL   Neutrophils Relative % 63  43 - 77 %   Neutro Abs 3.6  1.7 - 7.7 K/uL   Lymphocytes Relative 28  12 - 46 %   Lymphs Abs 1.6  0.7 - 4.0 K/uL   Monocytes Relative 7  3 - 12 %   Monocytes Absolute 0.4  0.1 - 1.0 K/uL   Eosinophils Relative 2  0 - 5 %   Eosinophils Absolute 0.1  0.0 - 0.7 K/uL   Basophils Relative 0  0 - 1 %   Basophils Absolute 0.0  0.0 - 0.1 K/uL  BASIC METABOLIC PANEL      Result Value Range    Sodium 138  137 - 147 mEq/L   Potassium 4.1  3.7 - 5.3 mEq/L   Chloride 97  96 - 112 mEq/L   CO2 27  19 - 32 mEq/L   Glucose, Bld 102 (*) 70 - 99 mg/dL   BUN 16  6 - 23 mg/dL   Creatinine, Ser 0.79  0.50 - 1.10 mg/dL   Calcium 10.8 (*) 8.4 - 10.5 mg/dL   GFR calc non Af Amer 84 (*) >90 mL/min   GFR calc Af Amer >90  >90 mL/min  ETHANOL      Result Value Range   Alcohol, Ethyl (B) <11  0 - 11 mg/dL  URINE RAPID DRUG SCREEN (HOSP PERFORMED)      Result Value Range   Opiates NONE DETECTED  NONE DETECTED   Cocaine NONE DETECTED  NONE DETECTED   Benzodiazepines NONE DETECTED  NONE DETECTED   Amphetamines NONE DETECTED  NONE DETECTED   Tetrahydrocannabinol NONE DETECTED  NONE DETECTED   Barbiturates NONE DETECTED  NONE DETECTED   Ct Head Wo Contrast 04/20/2013   CLINICAL DATA:  DIZZINESS  EXAM: CT HEAD WITHOUT CONTRAST  TECHNIQUE: Contiguous axial images were obtained from the base of the  skull through the vertex without intravenous contrast.  COMPARISON:  CT HEAD W/O CM dated 07/23/2012  FINDINGS: No acute intracranial hemorrhage. No focal mass lesion. No CT evidence of acute infarction. No midline shift or mass effect. No hydrocephalus. Basilar cisterns are patent. Paranasal sinuses and mastoid air cells are clear.  IMPRESSION: No acute intracranial findings and unchanged from prior.   Electronically Signed   By: Suzy Bouchard M.D.   On: 04/20/2013 18:25   Mr Brain Wo Contrast 04/21/2013   CLINICAL DATA:  Confusion.  Renal cell carcinoma  EXAM: MRI HEAD WITHOUT CONTRAST  TECHNIQUE: Multiplanar, multiecho pulse sequences of the brain and surrounding structures were obtained without intravenous contrast.  COMPARISON:  CT 04/20/2013  FINDINGS: Small white matter hyperintensities in the frontal lobes bilaterally, similar to the MRI of 02/11/2010.  Negative for acute infarct.  Negative for hemorrhage or mass. Brainstem and cerebellum are normal.  Vessels at the base of the brain are patent.   IMPRESSION: No acute abnormality.  No change from 2011 MRI.   Electronically Signed   By: Franchot Gallo M.D.   On: 04/21/2013 09:04    1900:  TTS eval pending.   2200:  TSS eval completed: pt accepted to [facility]. Will transfer stable.   Alfonzo Feller, DO 04/26/13 2336

## 2013-04-27 ENCOUNTER — Encounter (HOSPITAL_COMMUNITY): Payer: Self-pay | Admitting: Psychiatry

## 2013-04-27 ENCOUNTER — Ambulatory Visit: Payer: Self-pay | Admitting: Pulmonary Disease

## 2013-04-27 DIAGNOSIS — F411 Generalized anxiety disorder: Secondary | ICD-10-CM

## 2013-04-27 DIAGNOSIS — F332 Major depressive disorder, recurrent severe without psychotic features: Secondary | ICD-10-CM | POA: Diagnosis present

## 2013-04-27 MED ORDER — LORAZEPAM 1 MG PO TABS
1.0000 mg | ORAL_TABLET | Freq: Three times a day (TID) | ORAL | Status: DC | PRN
  Administered 2013-04-28 – 2013-05-08 (×10): 1 mg via ORAL
  Filled 2013-04-27 (×11): qty 1

## 2013-04-27 MED ORDER — BUPROPION HCL ER (XL) 300 MG PO TB24
300.0000 mg | ORAL_TABLET | Freq: Every day | ORAL | Status: DC
  Administered 2013-04-28 – 2013-05-08 (×11): 300 mg via ORAL
  Filled 2013-04-27 (×3): qty 1
  Filled 2013-04-27: qty 4
  Filled 2013-04-27 (×9): qty 1

## 2013-04-27 MED ORDER — ROPINIROLE HCL 1 MG PO TABS
1.0000 mg | ORAL_TABLET | Freq: Every day | ORAL | Status: DC
  Administered 2013-04-27 – 2013-05-07 (×11): 1 mg via ORAL
  Filled 2013-04-27 (×14): qty 1

## 2013-04-27 MED ORDER — ZOLPIDEM TARTRATE 10 MG PO TABS
10.0000 mg | ORAL_TABLET | Freq: Every evening | ORAL | Status: DC | PRN
  Administered 2013-04-27 – 2013-04-28 (×2): 10 mg via ORAL
  Filled 2013-04-27 (×2): qty 1

## 2013-04-27 MED ORDER — ROPINIROLE HCL 0.25 MG PO TABS
0.5000 mg | ORAL_TABLET | Freq: Every day | ORAL | Status: DC | PRN
  Administered 2013-04-27 – 2013-04-28 (×2): 0.5 mg via ORAL
  Filled 2013-04-27: qty 2

## 2013-04-27 MED ORDER — ROPINIROLE HCL 0.5 MG PO TABS
0.5000 mg | ORAL_TABLET | Freq: Every day | ORAL | Status: DC
  Administered 2013-04-27 – 2013-05-08 (×12): 0.5 mg via ORAL
  Filled 2013-04-27: qty 2
  Filled 2013-04-27 (×13): qty 1

## 2013-04-27 NOTE — [facility] Suicide Risk Assessment (Signed)
Suicide Risk Assessment  Admission Assessment     Nursing information obtained from:    Demographic factors:    Current Mental Status:    Loss Factors:    Historical Factors:    Risk Reduction Factors:    Total Time spent with patient: 1 hour  CLINICAL FACTORS:   Depression:   Hopelessness Severe  Psychiatric Specialty exam: see H and P  COGNITIVE FEATURES THAT CONTRIBUTE TO RISK:  Closed-mindedness Polarized thinking Thought constriction (tunnel vision)    SUICIDE RISK:   Moderate:  Frequent suicidal ideation with limited intensity, and duration, some specificity in terms of plans, no associated intent, good self-control, limited dysphoria/symptomatology, some risk factors present, and identifiable protective factors, including available and accessible social support.  PLAN OF CARE: Supportive approach/coping skills                               CBT;mindfulness                               Optimize treatment with psychotropics  I certify that inpatient services furnished can reasonably be expected to improve the patient's condition.  [place] A 04/27/2013, 6:53 PM
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
## 2013-04-27 NOTE — Progress Notes (Signed)
Pt is a 68 year old female admitted with depression and suicidal ideation   She was tearful during the assessment  Said she has poor memory poor concentration poor sleep and poor appetite   She said she feels like she is loosing herself and cant do anything about it She reports loosing interest in activities she previously enjoyed  When pt got on the unit she had a difficult time settleing in   She is very needy argumentative and attention seeking   She does respond well to redirection and has agreed to do some breathing exercises and focus on positive thoughts   She received medication and nourishment and was oriented to the unit   She has sleep apnea and uses a mouth device instead of a CPAP machine  Pt is being checked Q 15 min and is resting in bed at present
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
## 2013-04-27 NOTE — Progress Notes (Signed)
D.  Pt pleasant on approach, asked about moving to [place].  Pt states that she was told she would be moved once a bed became available.  Pt programming on [address] today.  Denies SI/HI/hallucinations at this time.  Main complaint of restless leg syndrome for which she is ordered Requip.  Positive for evening group, interacting appropriately within milieu.  A.  Support and encouragement offered, Dr. Sabra Heck called and asked about moving Pt, to which he agreed.  R.  Pt pleased about move, remains safe on unit.  Will continue to monitor.

## 2013-04-27 NOTE — BHH Group Notes (Signed)
[name] LCSW Aftercare Discharge Planning Group Note   04/27/2013 9:44 AM  Participation Quality:  DID NOT ATTEND-pt in bed sleeping/refused to attend group.   Smart, [employer]

## 2013-04-27 NOTE — Progress Notes (Signed)
NUTRITION ASSESSMENT  Pt identified as at risk on the Malnutrition Screen Tool  INTERVENTION: 1. Educated patient on the importance of nutrition and encouraged intake of food and beverages. 2. Educated pt on basic healthy eating tips and provided handouts of this information  NUTRITION DIAGNOSIS: Unintentional weight loss related to sub-optimal intake as evidenced by pt report.   Goal: Pt to meet >/= 90% of their estimated nutrition needs.  Monitor:  PO intake  Assessment:  Pt admitted with depression and suicidal ideation. Reports eating minimally for the past 9 days with 10 pound unintended weight loss during this time frame. Eating only yogurt, fruit, and cream of rice. Is trying to eat healthier and add more protein, fruits, and vegetable to her diet. Appetite improving today.   68 y.o. female  Height: Ht Readings from Last 1 Encounters:  04/26/13 5\' 3"  (1.6 m)    Weight: Wt Readings from Last 1 Encounters:  04/26/13 221 lb (100.245 kg)    Weight Hx: Wt Readings from Last 10 Encounters:  04/26/13 221 lb (100.245 kg)  04/20/13 225 lb 12.8 oz (102.422 kg)  01/24/13 231 lb 6.4 oz (104.962 kg)  07/21/12 225 lb 1.6 oz (102.105 kg)  06/30/12 222 lb (100.699 kg)  04/04/12 222 lb (100.699 kg)  04/04/12 222 lb (100.699 kg)  04/03/12 222 lb (100.699 kg)  02/15/12 223 lb (101.152 kg)  01/11/12 221 lb (100.245 kg)    BMI:  Body mass index is 39.16 kg/(m^2). Pt meets criteria for class II obesity based on current BMI.  Estimated Nutritional Needs: Kcal: 25-30 kcal/kg Protein: > 1 gram protein/kg Fluid: 1 ml/kcal  Diet Order: General Pt is also offered choice of unit snacks mid-morning and mid-afternoon.  Pt is eating as desired.   Lab results and medications reviewed.   Mikey College MS, [place], [place] Pager [phone] After Hours Pager

## 2013-04-27 NOTE — Progress Notes (Signed)
Adult Psychoeducational Group Note  Date:  04/27/2013 Time:  12:54 PM  Group Topic/Focus:  Relapse Prevention Planning:   The focus of this group is to define relapse and discuss the need for planning to combat relapse.  Participation Level:  Did Not Attend  Additional Comments:   Clint Bolder 04/27/2013, 12:54 PM

## 2013-04-27 NOTE — BHH Group Notes (Signed)
[facility] LCSW Group Therapy  04/27/2013  1:15 PM   Type of Therapy:  Group Therapy  Participation Level:  Active  Participation Quality:  Attentive, Sharing and Supportive  Affect:  Depressed and Flat  Cognitive:  Alert and Oriented  Insight:  Developing/Improving and Engaged  Engagement in Therapy:  Developing/Improving and Engaged  Modes of Intervention:  Clarification, Confrontation, Discussion, Education, Exploration, Limit-setting, Orientation, Problem-solving, Rapport Building, Art therapist, Socialization and Support  Summary of Progress/Problems: The topic for today was feelings about relapse.  Pt discussed what relapse prevention is to them and identified triggers that they are on the path to relapse.  Pt processed their feeling towards relapse and was able to relate to peers.  Pt discussed coping skills that can be used for relapse prevention.  Pt shared that relapse for her means getting depressed again.  Pt states that she feels guilt and anger after relapsing and has a hard time moving on from it.  Pt shared that she utilizes her faith and has found positive self talk is really beneficial for her and works.  Pt was supportive to peers and actively participated in group discussion.    Regan Lemming, LCSW 04/27/2013 2:26 PM

## 2013-04-27 NOTE — Progress Notes (Signed)
D: Patient denies SI/HI and auditory and visual hallucinations. Patient has a depressed mood 8-9 (1-10 scale) and sad affect. Pt reports poor sleep and appetite. Also reporting poor attention and low energy.  A: Patient given emotional support from RN. Patient given medications per MD orders. Patient encouraged to attend groups and unit activities. Patient encouraged to come to staff with any questions or concerns.  R: Patient remains cooperative and appropriate. Will continue to monitor patient for safety.

## 2013-04-27 NOTE — Tx Team (Signed)
Interdisciplinary Treatment Plan Update (Adult)  Date: 04/27/2013   Time Reviewed: 11:09 AM  Progress in Treatment:  Attending groups: No.  Participating in groups: No.   Taking medication as prescribed: Yes  Tolerating medication: Yes  Family/Significant othe contact made: Not yet. SPE required for this pt.   Patient understands diagnosis: Yes, AEB seeking treatment for mood stabilization/depression and passive SI.  Discussing patient identified problems/goals with staff: Yes  Medical problems stabilized or resolved: Yes  Denies suicidal/homicidal ideation: Passive SI.  Patient has not harmed self or Others: Yes  New problem(s) identified:  Discharge Plan or Barriers: Pt not attending d/c planning group. CSW assessing for appropriate referrals.  Additional comments:  Pt is a 68 year old female admitted with depression and suicidal ideation She was tearful during the assessment Said she has poor memory poor concentration poor sleep and poor appetite She said she feels like she is loosing herself and cant do anything about it She reports loosing interest in activities she previously enjoyed When pt got on the unit she had a difficult time settleing in She is very needy argumentative and attention seeking She does respond well to redirection and has agreed to do some breathing exercises and focus on positive thoughts She received medication and nourishment and was oriented to the unit She has sleep apnea and uses a mouth device instead of a CPAP machine Pt is being checked Q 15 min and is resting in bed at present     Reason for Continuation of Hospitalization: Medication management Mood stabilization Estimated length of stay: 3-5 days  For review of initial/current patient goals, please see plan of care.  Attendees:  Patient:    Family:    Physician: Carlton Adam  04/27/2013 11:08 AM   Nursing: Marye Round RN 04/27/2013 11:09 AM   Clinical Social Worker [place], [place]  04/27/2013 11:09 AM    Other: Hardie Pulley. PA 04/27/2013 11:09 AM   Other:    Other: Gerline Legacy Nurse CM 04/27/2013 11:09 AM   Other:    Scribe for Treatment Team:  [place] LCSWA 04/27/2013 11:08 AM

## 2013-04-28 DIAGNOSIS — F332 Major depressive disorder, recurrent severe without psychotic features: Secondary | ICD-10-CM | POA: Diagnosis present

## 2013-04-28 MED ORDER — ONDANSETRON HCL 4 MG PO TABS
4.0000 mg | ORAL_TABLET | Freq: Three times a day (TID) | ORAL | Status: DC | PRN
  Administered 2013-04-28 – 2013-05-06 (×5): 4 mg via ORAL
  Filled 2013-04-28 (×4): qty 1

## 2013-04-28 MED ORDER — MICONAZOLE NITRATE 200 MG VA SUPP: 200.0000 mg | Freq: Every day | VAGINAL | Status: DC

## 2013-04-28 MED ORDER — CLOTRIMAZOLE 1 % VA CREA
1.0000 | TOPICAL_CREAM | Freq: Every day | VAGINAL | Status: AC
  Administered 2013-04-28 – 2013-05-04 (×7): 1 via VAGINAL
  Filled 2013-04-28: qty 45

## 2013-04-28 MED ORDER — ONDANSETRON 4 MG PO TBDP
ORAL_TABLET | ORAL | Status: AC
  Filled 2013-04-28: qty 1

## 2013-04-28 NOTE — Progress Notes (Signed)
Adult Psychoeducational Group Note  Date:  04/28/2013 Time:  5:24 PM  Group Topic/Focus:  Healthy Communication:   The focus of this group is to discuss communication, barriers to communication, as well as healthy ways to communicate with others.  Participation Level:  Did Not Attend  Elisha Headland 04/28/2013, 5:24 PM

## 2013-04-28 NOTE — Progress Notes (Signed)
.  Psychoeducational Group Note    Date: 04/28/2013 Time:  0930    Goal Setting Purpose of Group: To be able to set a goal that is measurable and that can be accomplished in one day Participation Level:  Active  Participation Quality:  Appropriate  Affect:  Appropriate  Cognitive:  Oriented  Insight:  Improving  Engagement in Group:  Engaged  Additional Comments:    Keta [place] A 

## 2013-04-28 NOTE — Progress Notes (Signed)
D Merly is seen out in the milieu...interacting appropriately, without diff. She is compliant with her poc and takes her meds as they are ordered.    A She completes her morning self inventory and on it she writes she denies SI within the past 24 hrs, she rates her depression and hopelessness "4/7", respectively  and says her DC plan is to " get help faster".    R She is offered pos reinforcement via this Probation officer in relation to her verbalizing that she sought help much sooner this time.. Safety is maintained and poc fostered with therapeutic relationship.

## 2013-04-28 NOTE — Progress Notes (Signed)
[facility] MD Progress Note  04/28/2013 1:49 PM [name]  MRN:  [id number] Subjective:  Patient very somatic.  She stated she got upset this morning and it effected her stomach.  [name] was upset because she saw a patient that she has known for years and he is such poor mental and physical state.  She just can't believe how a person can be so devastated by mental illness.  [name] lives by herself and has an estranged daughter.   Diagnosis:   DSM5:  Trauma-Stressor Disorders:  Posttraumatic Stress Disorder (309.81) Total Time spent with patient: 30 minutes  Axis I: Anxiety Disorder NOS and Bipolar, Depressed;  Axis II: Deferred Axis III:  Past Medical History  Diagnosis Date  . Abscess of Bartholin's gland   . History of pleural effusion   . History of attention deficit disorder   . History of endometriosis   . RLS (restless legs syndrome)   . Anxiety   . Obesity   . GERD (gastroesophageal reflux disease)   . Renal mass 02/15/2012  . Dysrhythmia     h/o ventricular tachycardia- ablation resolved it  . Hypothyroidism   . Depression     Bipolar disorder  . OSA (obstructive sleep apnea)     uses oral appliance instead of CPAP  . Vertigo   . Chronic kidney disease     kidney cancer- pt states she has elected to not have it treated.  Marland Kitchen PONV (postoperative nausea and vomiting)   . Difficult intubation     told by [facility] that she was hard to intubate 15b yrs ago in [place]- surgery since then no problems  . Adverse effect of general anesthetic     felt paralyzed while receiving anesthesia  . Bipolar 1 disorder   . PTSD (post-traumatic stress disorder)   . rt renal ca dx'd 12/2009    no treatment/ no surg  . Hypertension   . Asthma     as a child   Axis IV: other psychosocial or environmental problems, problems related to social environment and problems with primary support group Axis V: 41-50 serious symptoms  ADL's:  Intact  Sleep: Fair  Appetite:  Fair  Suicidal Ideation:   Plan:  vague Intent:  none Means:  none Homicidal Ideation:  Denies  Psychiatric Specialty Exam: Physical Exam  Constitutional: She is oriented to person, place, and time. She appears well-developed and well-nourished.  HENT:  Head: Normocephalic and atraumatic.  Neck: Normal range of motion.  Respiratory: Effort normal.  GI: Soft.  Musculoskeletal: Normal range of motion.  Neurological: She is alert and oriented to person, place, and time.  Skin: Skin is warm and dry.    Review of Systems  Constitutional: Negative.   HENT: Negative.   Eyes: Negative.   Respiratory: Negative.   Cardiovascular: Negative.   Gastrointestinal: Negative.   Genitourinary: Negative.   Musculoskeletal: Negative.   Skin: Negative.   Neurological: Negative.   Endo/Heme/Allergies: Negative.   Psychiatric/Behavioral: Positive for depression and suicidal ideas. The patient is nervous/anxious.     Blood pressure 127/77, pulse 89, temperature 97.9 F (36.6 C), temperature source Oral, resp. rate 18, height 5' 3"  (1.6 m), weight 100.245 kg (221 lb).Body mass index is 39.16 kg/(m^2).  General Appearance: Casual  Eye Contact::  Fair  Speech:  Normal Rate  Volume:  Decreased  Mood:  Anxious and Depressed  Affect:  Congruent  Thought Process:  Coherent  Orientation:  Full (Time, Place, and Person)  Thought Content:  Rumination  Suicidal Thoughts:  Yes.  without intent/plan  Homicidal Thoughts:  No  Memory:  Immediate;   Fair Recent;   Fair Remote;   Fair  Judgement:  Fair  Insight:  Fair  Psychomotor Activity:  Decreased  Concentration:  Fair  Recall:  [employer] of Knowledge:Fair  Language: Fair  Akathisia:  No  Handed:  Right  AIMS (if indicated):     Assets:  Leisure Time Resilience  Sleep:  Number of Hours: 6.75   Musculoskeletal: Strength & Muscle Tone: within normal limits Gait & Station: normal Patient leans: N/A  Current Medications: Current Facility-Administered Medications   Medication Dose Route Frequency Provider Last Rate Last Dose  . acetaminophen (TYLENOL) tablet 650 mg  650 mg Oral Q6H PRN Lurena Nida, NP   650 mg at 04/27/13 2124  . alum & mag hydroxide-simeth (MAALOX/MYLANTA) 200-200-20 MG/5ML suspension 30 mL  30 mL Oral Q4H PRN Lurena Nida, NP      . aspirin EC tablet 81 mg  81 mg Oral Daily Lurena Nida, NP   81 mg at 04/28/13 0827  . buPROPion (WELLBUTRIN XL) 24 hr tablet 300 mg  300 mg Oral Daily Nicholaus Bloom, MD   300 mg at 04/28/13 0827  . hydrochlorothiazide (HYDRODIURIL) tablet 25 mg  25 mg Oral Daily Nicholaus Bloom, MD   25 mg at 04/28/13 0827  . irbesartan (AVAPRO) tablet 300 mg  300 mg Oral Daily Nicholaus Bloom, MD   300 mg at 04/28/13 0827  . lamoTRIgine (LAMICTAL) tablet 150 mg  150 mg Oral BID Lurena Nida, NP   150 mg at 04/28/13 2671  . levothyroxine (SYNTHROID, LEVOTHROID) tablet 88 mcg  88 mcg Oral QAC breakfast Lurena Nida, NP   88 mcg at 04/28/13 0606  . LORazepam (ATIVAN) tablet 1 mg  1 mg Oral TID PRN Nicholaus Bloom, MD      . magnesium hydroxide (MILK OF MAGNESIA) suspension 30 mL  30 mL Oral Daily PRN Lurena Nida, NP      . nebivolol (BYSTOLIC) tablet 2.5 mg  2.5 mg Oral Daily Lurena Nida, NP   2.5 mg at 04/28/13 2458  . rOPINIRole (REQUIP) tablet 0.5 mg  0.5 mg Oral QPC supper Nicholaus Bloom, MD   0.5 mg at 04/27/13 1808  . rOPINIRole (REQUIP) tablet 0.5 mg  0.5 mg Oral Daily PRN Nicholaus Bloom, MD   0.5 mg at 04/28/13 0998  . rOPINIRole (REQUIP) tablet 1 mg  1 mg Oral QHS Nicholaus Bloom, MD   1 mg at 04/27/13 2124  . traZODone (DESYREL) tablet 50 mg  50 mg Oral QHS PRN Lurena Nida, NP   50 mg at 04/27/13 0028  . zolpidem (AMBIEN) tablet 10 mg  10 mg Oral QHS PRN Nicholaus Bloom, MD   10 mg at 04/27/13 2201    Lab Results:  Results for orders placed during the hospital encounter of 04/26/13 (from the past 48 hour(s))  CBC WITH DIFFERENTIAL     Status: Abnormal   Collection Time    04/26/13  5:15 PM      Result Value  Range   WBC 5.7  4.0 - 10.5 K/uL   RBC 5.18 (*) 3.87 - 5.11 MIL/uL   Hemoglobin 14.5  12.0 - 15.0 g/dL   HCT 43.3  36.0 - 46.0 %   MCV 83.6  78.0 - 100.0 fL   MCH 28.0  26.0 - 34.0 pg   MCHC 33.5  30.0 - 36.0 g/dL   RDW 14.0  11.5 - 15.5 %   Platelets 280  150 - 400 K/uL   Neutrophils Relative % 63  43 - 77 %   Neutro Abs 3.6  1.7 - 7.7 K/uL   Lymphocytes Relative 28  12 - 46 %   Lymphs Abs 1.6  0.7 - 4.0 K/uL   Monocytes Relative 7  3 - 12 %   Monocytes Absolute 0.4  0.1 - 1.0 K/uL   Eosinophils Relative 2  0 - 5 %   Eosinophils Absolute 0.1  0.0 - 0.7 K/uL   Basophils Relative 0  0 - 1 %   Basophils Absolute 0.0  0.0 - 0.1 K/uL  BASIC METABOLIC PANEL     Status: Abnormal   Collection Time    04/26/13  5:15 PM      Result Value Range   Sodium 138  137 - 147 mEq/L   Potassium 4.1  3.7 - 5.3 mEq/L   Chloride 97  96 - 112 mEq/L   CO2 27  19 - 32 mEq/L   Glucose, Bld 102 (*) 70 - 99 mg/dL   BUN 16  6 - 23 mg/dL   Creatinine, Ser 0.79  0.50 - 1.10 mg/dL   Calcium 10.8 (*) 8.4 - 10.5 mg/dL   GFR calc non Af Amer 84 (*) >90 mL/min   GFR calc Af Amer >90  >90 mL/min   Comment: (NOTE)     The eGFR has been calculated using the CKD EPI equation.     This calculation has not been validated in all clinical situations.     eGFR's persistently <90 mL/min signify possible Chronic Kidney     Disease.  ETHANOL     Status: None   Collection Time    04/26/13  5:15 PM      Result Value Range   Alcohol, Ethyl (B) <11  0 - 11 mg/dL   Comment:            LOWEST DETECTABLE LIMIT FOR     SERUM ALCOHOL IS 11 mg/dL     FOR MEDICAL PURPOSES ONLY  URINE RAPID DRUG SCREEN (HOSP PERFORMED)     Status: None   Collection Time    04/26/13  5:18 PM      Result Value Range   Opiates NONE DETECTED  NONE DETECTED   Cocaine NONE DETECTED  NONE DETECTED   Benzodiazepines NONE DETECTED  NONE DETECTED   Amphetamines NONE DETECTED  NONE DETECTED   Tetrahydrocannabinol NONE DETECTED  NONE DETECTED    Barbiturates NONE DETECTED  NONE DETECTED   Comment:            DRUG SCREEN FOR MEDICAL PURPOSES     ONLY.  IF CONFIRMATION IS NEEDED     FOR ANY PURPOSE, NOTIFY LAB     WITHIN 5 DAYS.                LOWEST DETECTABLE LIMITS     FOR URINE DRUG SCREEN     Drug Class       Cutoff (ng/mL)     Amphetamine      1000     Barbiturate      200     Benzodiazepine   322     Tricyclics       025     Opiates  300     Cocaine          300     THC              50    Physical Findings: AIMS: Facial and Oral Movements Muscles of Facial Expression: Severe Lips and Perioral Area: Severe Jaw: Severe Tongue: Severe,Extremity Movements Upper (arms, wrists, hands, fingers): Severe Lower (legs, knees, ankles, toes): Severe, Trunk Movements Neck, shoulders, hips: Severe, Overall Severity Severity of abnormal movements (highest score from questions above): Severe Incapacitation due to abnormal movements: Severe Patient's awareness of abnormal movements (rate only patient's report): No Awareness, Dental Status Current problems with teeth and/or dentures?: No Does patient usually wear dentures?: No  CIWA:    COWS:     Treatment Plan Summary: Daily contact with patient to assess and evaluate symptoms and progress in treatment Medication management  Plan:  Review of chart, vital signs, medications, and notes. 1-Individual and group therapy 2-Medication management for depression and anxiety:  Medications reviewed with the patient and she stated no negative effects Zofran 4 mg PRN for nausea ordered Miconazole suppository for yeast infection 3-Coping skills for depression, anxiety  4-Continue crisis stabilization and management 5-Address health issues--monitoring vital signs, stable 6-Treatment plan in progress to prevent relapse of depression and anxiety  Medical Decision Making Problem Points:  Established problem, stable/improving (1) and Review of psycho-social stressors (1) Data  Points:  Review of medication regiment & side effects (2)  I certify that inpatient services furnished can reasonably be expected to improve the patient's condition.   Waylan Boga, [place] 04/28/2013, 1:49 PM  I agreed with the findings, treatment and disposition plan of this patient. Berniece Andreas, MD

## 2013-04-28 NOTE — Progress Notes (Signed)
Psychoeducational Group Note  Date: 04/28/2013 Time:  1015  Group Topic/Focus:  Identifying Needs:   The focus of this group is to help patients identify their personal needs that have been historically problematic and identify healthy behaviors to address their needs.  Participation Level:  Active  Participation Quality:  Appropriate  Affect:  Appropriate  Cognitive:  Oriented  Insight:  Improving  Engagement in Group:  Engaged  Additional Comments:    [name] A 

## 2013-04-28 NOTE — [facility] Group Notes (Signed)
[facility] Group Notes:  (Clinical Social Work)  04/28/2013   1:15-2:15PM  Summary of Progress/Problems:   The main focus of today's process group was for the patient to identify ways in which they have sabotaged their own mental health wellness/recovery.  Motivational interviewing and a handout were used to explore the benefits and costs of their self-sabotaging behavior as well as the benefits and costs of changing this behavior.  The Stages of Change were explained to the group using a handout, and patients identified where they are with regard to changing self-defeating behaviors.  The patient was late to group, so was not able to share the ways in which she self-sabotages, but she nodded assent as other people were sharing ideas and experiences and seemed comfortable.  She asked about the handouts for clarification, which she received.  Type of Therapy:  Process Group  Participation Level:  Active  Participation Quality:  Attentive  Affect:  Blunted  Cognitive:  Oriented  Insight:  Developing/Improving  Engagement in Therapy:  Engaged  Modes of Intervention:  Education, Motivational Interviewing   Selmer Dominion, LCSW 04/28/2013, 4:00pm

## 2013-04-28 NOTE — Progress Notes (Signed)
Adult Psychoeducational Group Note  Date:  04/28/2013 Time:  10:20 PM  Group Topic/Focus:  Wrap-Up Group:   The focus of this group is to help patients review their daily goal of treatment and discuss progress on daily workbooks.  Participation Level:  Active  Participation Quality:  Appropriate  Affect:  Appropriate  Cognitive:  Appropriate  Insight: Appropriate  Engagement in Group:  Engaged  Modes of Intervention:  Discussion  Additional Comments: The patient expressed that in group she realize how little she does for herself.The patient said she need to love herself.  Nash Shearer 04/28/2013, 10:20 PM

## 2013-04-29 DIAGNOSIS — F3130 Bipolar disorder, current episode depressed, mild or moderate severity, unspecified: Secondary | ICD-10-CM

## 2013-04-29 DIAGNOSIS — F411 Generalized anxiety disorder: Secondary | ICD-10-CM

## 2013-04-29 LAB — URINALYSIS, ROUTINE W REFLEX MICROSCOPIC
Bilirubin Urine: NEGATIVE
Glucose, UA: NEGATIVE mg/dL
Hgb urine dipstick: NEGATIVE
Ketones, ur: NEGATIVE mg/dL
Leukocytes, UA: NEGATIVE
Nitrite: NEGATIVE
Protein, ur: NEGATIVE mg/dL
Specific Gravity, Urine: 1.015 (ref 1.005–1.030)
Urobilinogen, UA: 0.2 mg/dL (ref 0.0–1.0)
pH: 5.5 (ref 5.0–8.0)

## 2013-04-29 MED ORDER — PANTOPRAZOLE SODIUM 40 MG PO TBEC
40.0000 mg | DELAYED_RELEASE_TABLET | Freq: Every day | ORAL | Status: DC
  Administered 2013-04-29 – 2013-05-08 (×10): 40 mg via ORAL
  Filled 2013-04-29 (×14): qty 1

## 2013-04-29 MED ORDER — TRAZODONE HCL 150 MG PO TABS
75.0000 mg | ORAL_TABLET | Freq: Every evening | ORAL | Status: DC | PRN
  Administered 2013-04-29: 75 mg via ORAL
  Filled 2013-04-29: qty 1

## 2013-04-29 NOTE — Progress Notes (Signed)
Patient ID: [name], female   DOB: [birth year], 68 y.o.   MRN: [id number] [facility] MD Progress Note  04/29/2013 6:58 PM [name]  MRN:  [id number] Subjective:  Patient very somatic.  She stated she got upset this morning and it effected her stomach.  Corine was upset because she saw a patient that she has known for years and he is such poor mental and physical state.  She just can't believe how a person can be so devastated by mental illness.  [name] lives by herself and has an estranged daughter.    During today's assessment, pt rates anxiety at 6/10 and depression at 4/10. Pt denies SI, HI, and AVH, but does contract for safety at this time. Pt reports feeling "much better" and that she has learned a lot of coping skills from the group therapy sessions. Pt reports satisfaction with current medication regiment and reports no side effects at this time.   Diagnosis:   DSM5:  Trauma-Stressor Disorders:  Posttraumatic Stress Disorder (309.81) Total Time spent with patient: 30 minutes  Axis I: Anxiety Disorder NOS and Bipolar, Depressed;  Axis II: Deferred Axis III:  Past Medical History  Diagnosis Date  . Abscess of Bartholin's gland   . History of pleural effusion   . History of attention deficit disorder   . History of endometriosis   . RLS (restless legs syndrome)   . Anxiety   . Obesity   . GERD (gastroesophageal reflux disease)   . Renal mass 02/15/2012  . Dysrhythmia     h/o ventricular tachycardia- ablation resolved it  . Hypothyroidism   . Depression     Bipolar disorder  . OSA (obstructive sleep apnea)     uses oral appliance instead of CPAP  . Vertigo   . Chronic kidney disease     kidney cancer- pt states she has elected to not have it treated.  Marland Kitchen PONV (postoperative nausea and vomiting)   . Difficult intubation     told by [facility] that she was hard to intubate 15b yrs ago in [place]- surgery since then no problems  . Adverse effect of general anesthetic     felt  paralyzed while receiving anesthesia  . Bipolar 1 disorder   . PTSD (post-traumatic stress disorder)   . rt renal ca dx'd 12/2009    no treatment/ no surg  . Hypertension   . Asthma     as a child   Axis IV: other psychosocial or environmental problems, problems related to social environment and problems with primary support group Axis V: 41-50 serious symptoms  ADL's:  Intact  Sleep: Fair  Appetite:  Fair  Suicidal Ideation:  Plan:  vague Intent:  none Means:  none Homicidal Ideation:  Denies  Psychiatric Specialty Exam: Physical Exam  Constitutional: She is oriented to person, place, and time. She appears well-developed and well-nourished.  HENT:  Head: Normocephalic and atraumatic.  Neck: Normal range of motion.  Respiratory: Effort normal.  GI: Soft.  Musculoskeletal: Normal range of motion.  Neurological: She is alert and oriented to person, place, and time.  Skin: Skin is warm and dry.    Review of Systems  Constitutional: Negative.   HENT: Negative.   Eyes: Negative.   Respiratory: Negative.   Cardiovascular: Negative.   Gastrointestinal: Negative.   Genitourinary: Negative.   Musculoskeletal: Negative.   Skin: Negative.   Neurological: Negative.   Endo/Heme/Allergies: Negative.   Psychiatric/Behavioral: Positive for depression and suicidal ideas. The patient is  nervous/anxious.     Blood pressure 114/80, pulse 89, temperature 98.3 F (36.8 C), temperature source Oral, resp. rate 18, height 5\' 3"  (1.6 m), weight 100.245 kg (221 lb).Body mass index is 39.16 kg/(m^2).  General Appearance: Casual  Eye Contact::  Fair  Speech:  Normal Rate  Volume:  Decreased  Mood:  Anxious and Depressed  Affect:  Congruent  Thought Process:  Coherent  Orientation:  Full (Time, Place, and Person)  Thought Content:  Rumination  Suicidal Thoughts:  No  Homicidal Thoughts:  No  Memory:  Immediate;   Fair Recent;   Fair Remote;   Fair  Judgement:  Fair  Insight:   Fair  Psychomotor Activity:  Decreased  Concentration:  Fair  Recall:  [employer] of Knowledge:Fair  Language: Fair  Akathisia:  No  Handed:  Right  AIMS (if indicated):     Assets:  Leisure Time Resilience  Sleep:  Number of Hours: 6.25   Musculoskeletal: Strength & Muscle Tone: within normal limits Gait & Station: normal Patient leans: N/A  Current Medications: Current Facility-Administered Medications  Medication Dose Route Frequency Provider Last Rate Last Dose  . acetaminophen (TYLENOL) tablet 650 mg  650 mg Oral Q6H PRN Lurena Nida, NP   650 mg at 04/27/13 2124  . alum & mag hydroxide-simeth (MAALOX/MYLANTA) 200-200-20 MG/5ML suspension 30 mL  30 mL Oral Q4H PRN Lurena Nida, NP      . aspirin EC tablet 81 mg  81 mg Oral Daily Lurena Nida, NP   81 mg at 04/29/13 0805  . buPROPion (WELLBUTRIN XL) 24 hr tablet 300 mg  300 mg Oral Daily Nicholaus Bloom, MD   300 mg at 04/29/13 0804  . clotrimazole (GYNE-LOTRIMIN) vaginal cream 1 Applicatorful  1 Applicatorful Vaginal QHS Lurena Nida, NP   1 Applicatorful at 19/14/78 2245  . hydrochlorothiazide (HYDRODIURIL) tablet 25 mg  25 mg Oral Daily Nicholaus Bloom, MD   25 mg at 04/29/13 0805  . irbesartan (AVAPRO) tablet 300 mg  300 mg Oral Daily Nicholaus Bloom, MD   300 mg at 04/29/13 0805  . lamoTRIgine (LAMICTAL) tablet 150 mg  150 mg Oral BID Lurena Nida, NP   150 mg at 04/29/13 1750  . levothyroxine (SYNTHROID, LEVOTHROID) tablet 88 mcg  88 mcg Oral QAC breakfast Lurena Nida, NP   88 mcg at 04/29/13 0700  . LORazepam (ATIVAN) tablet 1 mg  1 mg Oral TID PRN Nicholaus Bloom, MD   1 mg at 04/29/13 1750  . magnesium hydroxide (MILK OF MAGNESIA) suspension 30 mL  30 mL Oral Daily PRN Lurena Nida, NP      . nebivolol (BYSTOLIC) tablet 2.5 mg  2.5 mg Oral Daily Lurena Nida, NP   2.5 mg at 04/29/13 0803  . ondansetron (ZOFRAN) tablet 4 mg  4 mg Oral Q8H PRN Waylan Boga, NP   4 mg at 04/29/13 1052  . pantoprazole (PROTONIX) EC tablet  40 mg  40 mg Oral Daily Benjamine Mola, FNP   40 mg at 04/29/13 1750  . rOPINIRole (REQUIP) tablet 0.5 mg  0.5 mg Oral QPC supper Nicholaus Bloom, MD   0.5 mg at 04/28/13 1818  . rOPINIRole (REQUIP) tablet 0.5 mg  0.5 mg Oral Daily PRN Nicholaus Bloom, MD   0.5 mg at 04/28/13 2956  . rOPINIRole (REQUIP) tablet 1 mg  1 mg Oral QHS Nicholaus Bloom, MD   1  mg at 04/28/13 2045  . traZODone (DESYREL) tablet 75 mg  75 mg Oral QHS PRN Benjamine Mola, FNP        Lab Results:  Results for orders placed during the hospital encounter of 04/26/13 (from the past 48 hour(s))  URINALYSIS, ROUTINE W REFLEX MICROSCOPIC     Status: None   Collection Time    04/28/13  8:58 PM      Result Value Range   Color, Urine YELLOW  YELLOW   APPearance CLEAR  CLEAR   Specific Gravity, Urine 1.015  1.005 - 1.030   pH 5.5  5.0 - 8.0   Glucose, UA NEGATIVE  NEGATIVE mg/dL   Hgb urine dipstick NEGATIVE  NEGATIVE   Bilirubin Urine NEGATIVE  NEGATIVE   Ketones, ur NEGATIVE  NEGATIVE mg/dL   Protein, ur NEGATIVE  NEGATIVE mg/dL   Urobilinogen, UA 0.2  0.0 - 1.0 mg/dL   Nitrite NEGATIVE  NEGATIVE   Leukocytes, UA NEGATIVE  NEGATIVE   Comment: MICROSCOPIC NOT DONE ON URINES WITH NEGATIVE PROTEIN, BLOOD, LEUKOCYTES, NITRITE, OR GLUCOSE <1000 mg/dL.     Performed at [facility]    Physical Findings: AIMS: Facial and Oral Movements Muscles of Facial Expression: Severe Lips and Perioral Area: Severe Jaw: Severe Tongue: Severe,Extremity Movements Upper (arms, wrists, hands, fingers): Severe Lower (legs, knees, ankles, toes): Severe, Trunk Movements Neck, shoulders, hips: Severe, Overall Severity Severity of abnormal movements (highest score from questions above): Severe Incapacitation due to abnormal movements: Severe Patient's awareness of abnormal movements (rate only patient's report): No Awareness, Dental Status Current problems with teeth and/or dentures?: No Does patient usually wear dentures?: No   CIWA:    COWS:     Treatment Plan Summary: Daily contact with patient to assess and evaluate symptoms and progress in treatment Medication management  Plan:  Review of chart, vital signs, medications, and notes. 1-Individual and group therapy 2-Medication management for depression and anxiety:  Medications reviewed with the patient and she stated no negative effects  Continue Zofran 4 mg PRN for nausea  -Trazodone increased from 50mg  to 75mg  -Protonix 40mg  added for chronic acid reflux -Continue Miconazole suppository for yeast infection 3-Coping skills for depression, anxiety  4-Continue crisis stabilization and management 5-Address health issues--monitoring vital signs, stable 6-Treatment plan in progress to prevent relapse of depression and anxiety  Medical Decision Making Problem Points:  Established problem, stable/improving (1) and Review of psycho-social stressors (1) Data Points:  Review of medication regiment & side effects (2)  I certify that inpatient services furnished can reasonably be expected to improve the patient's condition.   Benjamine Mola, FNP-BC 04/29/2013, 6:58 PM I agreed with the findings, treatment and disposition plan of this patient. Berniece Andreas, MD

## 2013-04-29 NOTE — Progress Notes (Signed)
Adult Psychoeducational Group Note  Date:  04/29/2013 Time:  3:15PM  Group Topic/Focus:  Healthy Support Systems  Participation Level:  Active  Participation Quality:  Appropriate  Affect:  Appropriate  Cognitive:  Appropriate  Insight: Appropriate  Engagement in Group:  Engaged  Modes of Intervention:  Discussion  Additional Comments:  Pt was active and bright. Pt was asked to identify ways that she can become a better support to herself. Pt indicated that she plans to practice self talk and use positive affirmations more.   Zoila Shutter R 04/29/2013, 6:16 PM

## 2013-04-29 NOTE — Progress Notes (Signed)
Pt reports doing well today. Still has periods of confusion, forgetfulness. No other complaints at this time though per chart was somatic earlier in the day. Support, encouragement given. Reinforced what NP had told patient - that Lorrin Mais was d/c and trazadone initiated. Reinforced fall precautions. Pt verbalized understanding. Denies SI/HI/AVH and remains safe. Jamie Kato

## 2013-04-29 NOTE — Progress Notes (Signed)
[name] has had a difficult day today.Marland KitchenMarland KitchenWhile she has remained compliant with her poc, she takes her meds as scheduled and she attends her groups as planned, she has had a hard time regulating her emotions and dealing with her labile feelings.    A She requested and was given Ativan 1 mg for anxiety and stated relief after 1 hr.    R Safety is in place and poc cont with poc geared at helping pt develop healthier coping skills .

## 2013-04-29 NOTE — Progress Notes (Signed)
Psychoeducational Group Note  Date:  04/29/2013 Time:  1015  Group Topic/Focus:  Making Healthy Choices:   The focus of this group is to help patients identify negative/unhealthy choices they were using prior to admission and identify positive/healthier coping strategies to replace them upon discharge.  Participation Level:  Active  Participation Quality:  Appropriate  Affect:  Appropriate  Cognitive:  Oriented  Insight:  Engaged  Engagement in Group:  Engaged  Additional Comments:  Pt was engaged in group  [place], [place] A 04/29/2013

## 2013-04-29 NOTE — Progress Notes (Signed)
Psychoeducational Group Note  Date: 04/29/2013  Time: 0930  Group Topic/Focus:  Gratefulness: The focus of this group is to help patients identify what two things they are most grateful for in their lives. What helps ground them and to center them on their work to their recovery.  Participation Level: Active  Participation Quality: Appropriate  Affect: Appropriate  Cognitive: Oriented  Insight: Improving  Engagement in Group: Engaged  Additional Comments: Attended and participated in the group  Bryson Dames A

## 2013-04-29 NOTE — Progress Notes (Signed)
Pt with frequent needs tonight. Somatic and bizarre at times. "I just feel pressure like it was outside of my body but it was me." Complaining of nausea, vaginal itching, difficulty sleeping. Pt supported, reassured and medicated for complaints. Nausea resolved with zofran and ambien given for sleep. On reassess, pt is resting. She denies SI/HI/AVH and remains safe. Jamie Kato

## 2013-04-29 NOTE — Progress Notes (Signed)
Adult Psychoeducational Group Note  Date:  04/29/2013 Time:  9:34 PM  Group Topic/Focus:  Wrap-Up Group:   The focus of this group is to help patients review their daily goal of treatment and discuss progress on daily workbooks.  Participation Level:  Active  Participation Quality:  Appropriate  Affect:  Appropriate  Cognitive:  Appropriate  Insight: Appropriate  Engagement in Group:  Engaged  Modes of Intervention:  Discussion  Additional Comments: The patient expressed she had a level day and less depression. [name] 04/29/2013, 9:34 PM

## 2013-04-29 NOTE — BHH Group Notes (Signed)
[facility] Group Notes:  (Clinical Social Work)  04/29/2013   1:15-2:15PM  Summary of Progress/Problems:  The main focus of today's process group was to   identify the patient's current support system and decide on other supports that can be put in place.  The picture on workbook was used to discuss why additional supports are needed.  An emphasis was placed on using counselor, doctor, therapy groups, 12-step groups, and problem-specific support groups to expand supports.   There was also an extensive discussion about what constitutes a healthy support versus an unhealthy support.  The patient expressed full comprehension of the concepts presented, and agreed that there is a need to add more supports.    Type of Therapy:  Process Group  Participation Level:  Active  Participation Quality:  Attentive and Sharing  Affect:  Blunted and anxious  Cognitive:  Appropriate and Oriented  Insight:  Improving  Engagement in Therapy:  Engaged  Modes of Intervention:  Education,  Support and [employer], LCSW 04/29/2013, 4:00pm

## 2013-04-30 DIAGNOSIS — F411 Generalized anxiety disorder: Secondary | ICD-10-CM

## 2013-04-30 DIAGNOSIS — F332 Major depressive disorder, recurrent severe without psychotic features: Secondary | ICD-10-CM

## 2013-04-30 DIAGNOSIS — F4310 Post-traumatic stress disorder, unspecified: Secondary | ICD-10-CM

## 2013-04-30 MED ORDER — MUSCLE RUB 10-15 % EX CREA
TOPICAL_CREAM | CUTANEOUS | Status: DC | PRN
  Administered 2013-04-30: 23:00:00 via TOPICAL
  Filled 2013-04-30: qty 85

## 2013-04-30 MED ORDER — IBUPROFEN 600 MG PO TABS
600.0000 mg | ORAL_TABLET | Freq: Four times a day (QID) | ORAL | Status: DC | PRN
  Administered 2013-04-30 – 2013-05-07 (×8): 600 mg via ORAL
  Filled 2013-04-30 (×2): qty 1
  Filled 2013-04-30: qty 3
  Filled 2013-04-30 (×6): qty 1
  Filled 2013-04-30: qty 3

## 2013-04-30 MED ORDER — DOXEPIN HCL 25 MG PO CAPS
25.0000 mg | ORAL_CAPSULE | Freq: Every evening | ORAL | Status: DC | PRN
  Administered 2013-04-30 – 2013-05-02 (×3): 25 mg via ORAL
  Filled 2013-04-30 (×4): qty 1

## 2013-04-30 NOTE — BHH Group Notes (Signed)
[facility] LCSW Aftercare Discharge Planning Group Note   04/30/2013 12:07 PM    Participation Quality:  Appropraite  Mood/Affect:  Appropriate  Depression Rating:  8  Anxiety Rating:  7  Thoughts of Suicide:  No  Will you contract for safety?   NA  Current AVH:  No  Plan for Discharge/Comments:  Patient attended discharge planning group and actively participated in group. She advised of not feeling well due to a headache.  Patient advised of plans to follow up with [facility].   CSW provided all participants with daily workbook.   Transportation Means: Patient has transportation.   Supports:  Patient has a support system.   Jaysha Lasure, [place] Post

## 2013-04-30 NOTE — Progress Notes (Signed)
Adult Psychoeducational Group Note  Date:  04/30/2013 Time:  10:22 PM  Group Topic/Focus:  Goals Group:   The focus of this group is to help patients establish daily goals to achieve during treatment and discuss how the patient can incorporate goal setting into their daily lives to aide in recovery.  Participation Level:  Active  Participation Quality:  Appropriate  Affect:  Appropriate  Cognitive:  Appropriate  Insight: Appropriate  Engagement in Group:  Engaged  Modes of Intervention:  Discussion  Additional Comments:  Pt stated she was glad to get out of her room today and be around the others.  Alexis Goodell R 04/30/2013, 10:22 PM

## 2013-04-30 NOTE — Progress Notes (Signed)
Patient ID: [name], female   DOB: [birth year], 68 y.o.   MRN: [id number] D- Patient reports poor sleep with medication and says her appetite is improving.  Her energy level is low and her ability to pay attention is poor.  She is rating her depression at 5/10 and her hopelessness at 8/10.  This morning she felt nauseated and had a severe headache and was feeling anxious. A- Talked with patient about her symptoms her anxiety and her participation.  R- Patient showered, took zofran and was able to go to lunch, eat a little and seemed to feel better.  Patient's room is messy and she says "this is what depression looks like.  She was unable to find a paper and she says "this is what it is like at home too".  She does seem more alert and focused than she did this am.

## 2013-04-30 NOTE — BHH Suicide Risk Assessment (Signed)
[facility] INPATIENT:  Family/Significant Other Suicide Prevention Education  Suicide Prevention Education:  Patient Refusal for Family/Significant Other Suicide Prevention Education: The patient [name] has refused to provide written consent for family/significant other to be provided Family/Significant Other Suicide Prevention Education during admission and/or prior to discharge.  Physician notified.  Concha Pyo 04/30/2013, 3:36 PM

## 2013-04-30 NOTE — Progress Notes (Signed)
Pt attended spiritual care group on grief and loss facilitated by chaplain Jerene Pitch.  Group opened with brief discussion and psycho-social ed around grief and loss in relationships and in relation to self - identifying life patterns, circumstances, changes that cause losses. Established group norm of speaking from own life experience. Group goal of establishing open and affirming space for members to share loss and experience with grief, normalize grief experience and provide psycho social education and grief support.   Moriyah shared with group her awareness of significant grief around loss of self.  Shared grief around not finishing college due to depression, loss of relationships and recent divorce.  Identified with feeling of isolation and explored with other group members what is helpful about being heard in [facility] and how they may be able to find this resource outside hospital.    [place], [place]

## 2013-04-30 NOTE — Clinical Social Work Note (Signed)
Writer spoke to [name], [facility] Transition Care Team to refer patient for services.  [name] advised patient's do not need an open chart with [facility] to receive transition team services.

## 2013-04-30 NOTE — Tx Team (Signed)
Interdisciplinary Treatment Plan Update   Date Reviewed:  04/30/2013  Time Reviewed:  8:29 AM  Progress in Treatment:   Attending groups: Yes Participating in groups: Yes Taking medication as prescribed: Yes  Tolerating medication: Yes Family/Significant other contact made:  No, but will ask patient for consent for collateral contact Patient understands diagnosis: Yes  Discussing patient identified problems/goals with staff: Yes Medical problems stabilized or resolved: Yes Denies suicidal/homicidal ideation: Yes Patient has not harmed self or others: Yes  For review of initial/current patient goals, please see plan of care.  Estimated Length of Stay:  3-5 days  Reasons for Continued Hospitalization:  Anxiety Depression Medication stabilization   New Problems/Goals identified:    Discharge Plan or Barriers:   Home with outpatient follow up to be determined  Additional Comments:  68 Y/o female who state she has been going down hill before the holidays. States she waits for things to get better but they dont. She stays in bed, states she had symptoms that felt like a stroke but they were mostly depression. 'A deeper level" of depression. "I am going to fall off a cliff. States that Christmas might have been a trigger. She had to leave her apartmen as thye had raised the rent she could not afford it. Couldd not find a place, her car broke could not fix it, had no money. States that since the divorce she has a totally different life style. States it go so bad she was afraid for herself. For the first time a friend committed suicide and then another young guy killed himself and states she felt "this tastes good." Admit she is tired of fighting. Sates she has black outs, cant follow instructions, lost her phome, losing things disoriented as far as time, walked off her car and left the door open.    Attendees:  Patient:  04/30/2013 8:29 AM   Signature: 04/30/2013 8:29 AM  Signature:   04/30/2013  8:29 AM  Signature:  Catalina Pizza, NP 04/30/2013 8:29 AM  Signature:Beverly Danelle Earthly, RN 04/30/2013 8:29 AM  Signature:  Thurnell Garbe RN 04/30/2013 8:29 AM  Signature:  Joette Catching, LCSW 04/30/2013 8:29 AM  Signature:  Regan Lemming, LCSW 04/30/2013 8:29 AM  Signature:  Norberto Sorenson, Care Coordinator [facility] 04/30/2013 8:29 AM  Signature:  Marshall Cork, RN 04/30/2013 8:29 AM  Signature: 04/30/2013  8:29 AM  Signature:   Lars Pinks, RN [facility] 04/30/2013  8:29 AM  Signature:   04/30/2013  8:29 AM    Scribe for Treatment Team:   Joette Catching,  04/30/2013 8:29 AM

## 2013-04-30 NOTE — BHH Group Notes (Addendum)
[facility] LCSW Group Therapy          Overcoming Obstacles       1:15 -2:30        04/30/2013   3:29 PM     Type of Therapy:  Group Therapy  Participation Level:  Appropriate  Participation Quality:  Appropriate  Affect:  Appropriate, Alert  Cognitive:  Attentive Appropriate  Insight: Developing/Improving Engaged  Engagement in Therapy: Developing/Imprvoing Engaged  Modes of Intervention:  Discussion Exploration  Education Rapport BuildingProblem-Solving Support  Summary of Progress/Problems:  The main focus of today's group was overcoming obstacles.  Patient shared the obstacle she has to overcome is lack of motivation.  She shared she does not have a support group and spends too much time in isolation.  Patient advised of [facility] and also of Transitional Team services offered through [facility].  Patient able to identify appropriate coping skills.   Concha Pyo 04/30/2013  3:29 PM

## 2013-04-30 NOTE — Progress Notes (Signed)
Recreation Therapy Notes  Date: 02.09.2015 Time: 2:45pm Location: [address] Dayroom   Group Topic: Wellness  Goal Area(s) Addresses:  Patient will identify dimension of wellness they most struggle with.  Patient will identify at least 3 ways to invest in that type of wellness.  Patient will identify benefit of identifying areas of improvement.   Behavioral Response: Appropriate, Engaged, Attentive  Intervention: Art  Activity: Patients were provided with a worksheet outlining 6 dimensions of wellness. Using this worksheet patients were asked to identify the area they most need to invest in. Using art supplies Engineer, materials paper, markers, crayons, magazine clippings, scissors, and glue) patients were asked to design a poster around the three things they are going to do to invest in their wellness.   Education: Wellness, Dentist.   Education Outcome: Acknowledges understanding   Clinical Observations/Feedback: Patient actively engaged in activity. Patient chose to focus on her intellectual wellness, successfully identifying three ways she can invest in her intellectual wellness. Patient contributed to group discussion, identifying positive emotions associated with investing in wellness. Patient additionally was able to relate each dimension to each other, as well as giving suggestions for making an action plan for investment in wellness post d/c.   Laureen Ochs Jaydy Fitzhenry, LRT/CTRS  Alnisa Hasley L 04/30/2013 5:25 PM
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
## 2013-05-01 MED ORDER — DOCUSATE SODIUM 100 MG PO CAPS
100.0000 mg | ORAL_CAPSULE | Freq: Two times a day (BID) | ORAL | Status: DC
  Administered 2013-05-01 – 2013-05-08 (×10): 100 mg via ORAL
  Filled 2013-05-01 (×19): qty 1

## 2013-05-01 MED ORDER — BISACODYL 10 MG RE SUPP
10.0000 mg | Freq: Once | RECTAL | Status: DC
  Filled 2013-05-01 (×2): qty 1

## 2013-05-01 NOTE — Progress Notes (Signed)
Pt reports she is doing better this evening.  She says she had nausea this morning, but she was able to go down for dinner this evening.  Pt also says she is feeling more hopeful and that being in the hospital has helped her.  She feels the medications are helping her.  She denies SI/HI/AV at this time.  She says she attended some groups today.  Pt makes her needs known to staff.  Support and encouragement offered.  Safety maintained with q15 minute checks.

## 2013-05-01 NOTE — Progress Notes (Signed)
Patient ID: [name], female   DOB: 1945-11-14, 67 y.o.   MRN: [id number] D- Patient reports she slept well on new sleep med last night, "good sleep in a long time".   .  She says her appetite is good and her energy level is low and her ability to pay attention is poor.  She is rating her depression at 5/10 and her hopelessness at 8/10.  She is not feeling anxious today.She is attending groups and interacting with peers and staff.  A- Supported patient.  R- patient says she is trying to focus on positive self talk and talked about finding ways to be successful at exercising.  This is something she wants to do and enjoyed doing it in group today.

## 2013-05-01 NOTE — Progress Notes (Signed)
D:Patient in her room talking to her roommate on [facility].  Patient states she had a better day today.  Patient states she has been having headaches and she states she was able to talk to the nurse practitioner about it so she feels hopeful.  Patient states she has also been more social than she normally is and patient states this is a good thing.  Patient denies SI/HI and denies AVH. A: Staff to monitor Q 15 mins for safety.  Encouragement and support offered.  Scheduled medications administered per orders. R: Patient remains safe on the unit.  Patient attended group tonight.  Patient visible on the unit and interacting with peers.  Patient taking administered medications.

## 2013-05-01 NOTE — Progress Notes (Signed)
Pt c/o constipation and abd cramps. Pt verbalized to writer that she have been drinking water and taking milk of mag, and it has not been effective. Writer reported complaint to FNP. Pt ordered stool softener and a suppository. Pt reported to writer that she had a BM around 1630. Pt requested to take suppository tonight at bedtime if needed.

## 2013-05-01 NOTE — Progress Notes (Signed)
Adult Psychoeducational Group Note  Date:  05/01/2013 Time: 2000   Group Topic/Focus:  Wrap-Up Group:   The focus of this group is to help patients review their daily goal of treatment and discuss progress on daily workbooks.  Participation Level:  Active  Participation Quality:  Appropriate  Affect:  Flat  Cognitive:  Appropriate  Insight: Appropriate  Engagement in Group:  Engaged  Modes of Intervention:  Discussion, Education and Support  Additional Comments:    Jacob Moores Monique 05/01/2013, 10:37 PM

## 2013-05-01 NOTE — [facility] Group Notes (Signed)
[facility]      Feelings About Diagnosis 1:15 - 2:30 PM         05/01/2013  3:15 PM    Type of Therapy:  Group Therapy  Participation Level:  Active  Participation Quality:  Appropriate  Affect:  Appropriate  Cognitive:  Alert and Appropriate  Insight:  Developing/Improving and Engaged  Engagement in Therapy:  Developing/Improving and Engaged  Modes of Intervention:  Discussion, Education, Exploration, Problem-Solving, Rapport Building, Support  Summary of Progress/Problems:  Patient actively participated in group. Patient discussed past and present diagnosis and the effects it has had on  life.  Patient talked about family and society being judgmental and the stigma associated with having a mental health diagnosis.  She advised of being relieved to have a diagnosis and getting the help she needs.  Concha Pyo 05/01/2013  3:15 PM

## 2013-05-01 NOTE — Progress Notes (Signed)
The focus of this group is to educate the patient on the purpose and policies of crisis stabilization and provide a format to answer questions about their admission.  The group details unit policies and expectations of patients while admitted.  Patient attended 0900 nurse education orientation group this morning.  Patient actively participated, appropriate affect, alert, appropriate insight and engagement.  Today patient will work on 3 goals for discharge.  

## 2013-05-02 MED ORDER — FLUOXETINE HCL 10 MG PO CAPS
10.0000 mg | ORAL_CAPSULE | Freq: Every day | ORAL | Status: DC
  Filled 2013-05-02 (×2): qty 1

## 2013-05-02 MED ORDER — FLUOXETINE HCL 10 MG PO CAPS
10.0000 mg | ORAL_CAPSULE | Freq: Every day | ORAL | Status: DC
  Administered 2013-05-02 – 2013-05-08 (×7): 10 mg via ORAL
  Filled 2013-05-02 (×9): qty 1
  Filled 2013-05-02: qty 4

## 2013-05-02 NOTE — Progress Notes (Signed)
Adult Psychoeducational Group Note  Date:  05/02/2013 Time:  9:12 PM  Group Topic/Focus:  Wrap-Up Group:   The focus of this group is to help patients review their daily goal of treatment and discuss progress on daily workbooks.  Participation Level:  Active  Participation Quality:  Appropriate  Affect:  Appropriate  Cognitive:  Appropriate  Insight: Appropriate  Engagement in Group:  Engaged  Modes of Intervention:  Discussion  Additional Comments: The patient expressed that the group are helping her learn more about herself.The patient said that she did not have joint pain.  [name] 05/02/2013, 9:12 PM

## 2013-05-02 NOTE — Progress Notes (Signed)
Patient appeared to be very angry at the beginning of the shift. She endorsed feeling very anxious and having some rapid cycling mood swings. Writer encouraged patient to walk on the hallway and try breathing through her mouth. Also talk to patient about heart maths and if that is something she might be interested in doing. Patient receptive to encouragement and support. Q 15 minute check continues as ordered to maintain safety.

## 2013-05-02 NOTE — Progress Notes (Signed)
Patient ID: [name], female   DOB: 1945/11/22, 68 y.o.   MRN: [id number]  D: Pt. Denies SI/HI and A/V Hallucinations. Patient rates her depression at 6/10 and her hopelessness at 5/10 for the day. She also reports that she has slept well. Patient does not report any pain. Patient did report that she was feeling nauseated when she woke up this morning but after ginger ale she stated that she was feeling better. Zofran was offered but patient refused.  A: Support and encouragement provided to the patient to come to writer with any questions or concerns. Patient wrote on her daily inventory sheet that she felt like, "people verbally run me over when I begin to speak. Am I speaking too low or am I saying worthless thoughts." Writer spoke with her and reassured her that what she was saying was important and that she needs to speak louder. Patient verbalized understanding.  R: Patient is receptive and cooperative but tearful at times. Pt is seen in the milieu speaking with other patients and is going to groups. Q15 minute checks are maintained for safety.

## 2013-05-02 NOTE — Progress Notes (Signed)
[facility] MD Progress Note  05/02/2013 6:53 PM [name]  MRN:  [id number] Subjective:  [name] endorses persistence of her depression. States that she continues to be in that dark space. States she thinks about death and would welcome it. States that she is not pursuing treatment for her cancer as wants the cancer to kill her. She endorses mood fluctuations within the depression. States she has a lot of crying spells. Feels all alone, the person who she considered her only friend has not responded to a message she left on her answering machine that she was in the hospital. She is anticipating yet another lost as she will have to move from where she is now she cant afford an increase in rent. Diagnosis:   DSM5: Schizophrenia Disorders:  none Obsessive-Compulsive Disorders:  none Trauma-Stressor Disorders:  none Substance/Addictive Disorders:  none Depressive Disorders:  Major Depressive Disorder - Severe (296.23) Total Time spent with patient: 30 minutes  Axis I: Generalized Anxiety Disorder  ADL's:  Intact  Sleep: Fair  Appetite:  Fair  Suicidal Ideation:  Plan:  denies Intent:  denies Means:  denies Homicidal Ideation:  Plan:  denies Intent:  denies Means:  denies AEB (as evidenced by):  Psychiatric Specialty Exam: Physical Exam  Review of Systems  Constitutional: Positive for malaise/fatigue.  HENT: Negative.   Eyes: Negative.   Respiratory: Negative.   Cardiovascular: Negative.   Gastrointestinal: Negative.   Genitourinary: Negative.   Musculoskeletal: Negative.   Skin: Negative.   Neurological: Positive for weakness.  Endo/Heme/Allergies: Negative.   Psychiatric/Behavioral: Positive for depression. The patient is nervous/anxious.     Blood pressure 122/82, pulse 81, temperature 97.4 F (36.3 C), temperature source Oral, resp. rate 16, height 5\' 3"  (1.6 m), weight 100.245 kg (221 lb).Body mass index is 39.16 kg/(m^2).  General Appearance: Fairly Groomed  Chemical engineer::  Fair  Speech:  Clear and Coherent, Slow and not spontaneous  Volume:  Decreased  Mood:  Depressed  Affect:  Depressed and Tearful  Thought Process:  Coherent and Goal Directed  Orientation:  Full (Time, Place, and Person)  Thought Content:  symptoms, worries, concerns, a sense of hoplessness, helplessness  Suicidal Thoughts:  Passive SI (despondent will welcome death)  Homicidal Thoughts:  No  Memory:  Immediate;   Fair Recent;   Fair Remote;   Fair  Judgement:  Fair  Insight:  Present  Psychomotor Activity:  Restlessness  Concentration:  Fair  Recall:  [employer] of [place]: Fair  Akathisia:  No  Handed:    AIMS (if indicated):     Assets:  Desire for Improvement  Sleep:  Number of Hours: 6   Musculoskeletal: Strength & Muscle Tone: within normal limits Gait & Station: normal Patient leans: N/A  Current Medications: Current Facility-Administered Medications  Medication Dose Route Frequency Provider Last Rate Last Dose  . acetaminophen (TYLENOL) tablet 650 mg  650 mg Oral Q6H PRN Lurena Nida, NP   650 mg at 04/30/13 0846  . alum & mag hydroxide-simeth (MAALOX/MYLANTA) 200-200-20 MG/5ML suspension 30 mL  30 mL Oral Q4H PRN Lurena Nida, NP      . aspirin EC tablet 81 mg  81 mg Oral Daily Lurena Nida, NP   81 mg at 05/02/13 0929  . bisacodyl (DULCOLAX) suppository 10 mg  10 mg Rectal Once Benjamine Mola, FNP      . buPROPion (WELLBUTRIN XL) 24 hr tablet 300 mg  300 mg Oral Daily Geralyn Flash  Brett Fairy, MD   300 mg at 05/02/13 0843  . clotrimazole (GYNE-LOTRIMIN) vaginal cream 1 Applicatorful  1 Applicatorful Vaginal QHS Lurena Nida, NP   1 Applicatorful at 29/51/88 2237  . docusate sodium (COLACE) capsule 100 mg  100 mg Oral BID Benjamine Mola, FNP   100 mg at 05/02/13 4166  . doxepin (SINEQUAN) capsule 25 mg  25 mg Oral QHS PRN,MR X 1 Benjamine Mola, FNP   25 mg at 05/01/13 2237  . FLUoxetine (PROZAC) capsule 10 mg  10 mg Oral Daily Nicholaus Bloom, MD       . hydrochlorothiazide (HYDRODIURIL) tablet 25 mg  25 mg Oral Daily Nicholaus Bloom, MD   25 mg at 05/02/13 0843  . ibuprofen (ADVIL,MOTRIN) tablet 600 mg  600 mg Oral Q6H PRN Benjamine Mola, FNP   600 mg at 05/01/13 2005  . irbesartan (AVAPRO) tablet 300 mg  300 mg Oral Daily Nicholaus Bloom, MD   300 mg at 05/02/13 [phone]  . lamoTRIgine (LAMICTAL) tablet 150 mg  150 mg Oral BID Lurena Nida, NP   150 mg at 05/02/13 1722  . levothyroxine (SYNTHROID, LEVOTHROID) tablet 88 mcg  88 mcg Oral QAC breakfast Lurena Nida, NP   88 mcg at 05/02/13 1601  . LORazepam (ATIVAN) tablet 1 mg  1 mg Oral TID PRN Nicholaus Bloom, MD   1 mg at 04/30/13 0846  . magnesium hydroxide (MILK OF MAGNESIA) suspension 30 mL  30 mL Oral Daily PRN Lurena Nida, NP   30 mL at 05/01/13 0830  . MUSCLE RUB CREA   Topical PRN Laverle Hobby, PA-C      . nebivolol (BYSTOLIC) tablet 2.5 mg  2.5 mg Oral Daily Lurena Nida, NP   2.5 mg at 05/02/13 0843  . ondansetron (ZOFRAN) tablet 4 mg  4 mg Oral Q8H PRN Waylan Boga, NP   4 mg at 04/30/13 1213  . pantoprazole (PROTONIX) EC tablet 40 mg  40 mg Oral Daily Benjamine Mola, FNP   40 mg at 05/02/13 0843  . rOPINIRole (REQUIP) tablet 0.5 mg  0.5 mg Oral QPC supper Nicholaus Bloom, MD   0.5 mg at 05/01/13 1809  . rOPINIRole (REQUIP) tablet 0.5 mg  0.5 mg Oral Daily PRN Nicholaus Bloom, MD   0.5 mg at 04/28/13 0932  . rOPINIRole (REQUIP) tablet 1 mg  1 mg Oral QHS Nicholaus Bloom, MD   1 mg at 05/01/13 2218    Lab Results: No results found for this or any previous visit (from the past 48 hour(s)).  Physical Findings: AIMS: Facial and Oral Movements Muscles of Facial Expression: Severe Lips and Perioral Area: Severe Jaw: Severe Tongue: Severe,Extremity Movements Upper (arms, wrists, hands, fingers): Severe Lower (legs, knees, ankles, toes): Severe, Trunk Movements Neck, shoulders, hips: Severe, Overall Severity Severity of abnormal movements (highest score from questions above):  Severe Incapacitation due to abnormal movements: Severe Patient's awareness of abnormal movements (rate only patient's report): No Awareness, Dental Status Current problems with teeth and/or dentures?: No Does patient usually wear dentures?: No  CIWA:    COWS:     Treatment Plan Summary: Daily contact with patient to assess and evaluate symptoms and progress in treatment  Plan: Supportive approach/coping skills           CBT; mindfulness           Will add Prozac 10 mg to the Wellbutrin XL 300  mg. She had stated that the Prozac help            her when she first took it years ago. It seemed to have quit on her but she does not                          remember if the dose was increased             Still a sense of  Hopelessness, helplessness with persistent thoughts that she would rather be             dead. States that does not feel safe if she was to be at her home right now  Medical Decision Making Problem Points:  Established problem, worsening (2) and Review of psycho-social stressors (1) Data Points:  Review of medication regiment & side effects (2) Review of new medications or change in dosage (2)  I certify that inpatient services furnished can reasonably be expected to improve the patient's condition.   Valaria Kohut A 05/02/2013, 6:53 PM

## 2013-05-02 NOTE — [facility] Group Notes (Signed)
[facility] LCSW Group Therapy  Emotional Regulation 1:15 - 2: 30 PM        05/02/2013  3:51 PM   Type of Therapy:  Group Therapy  Participation Level:  Appropriate  Participation Quality:  Appropriate  Affect:  Appropriate  Cognitive:  Attentive Appropriate  Insight:  Developing/Improving Engaged  Engagement in Therapy:  Developing/Improving Engaged  Modes of Intervention:  Discussion Exploration Problem-Solving Supportive  Summary of Progress/Problems:  Group topic was emotional regulations.  Patient participated in the discussion and was able to identify an emotion that needed to regulated.  She advised of dealing with a lot of sadness and feelings of emptiness.  Patient was unable to state the reason for those feelings.   Concha Pyo 05/02/2013 3:51 PM

## 2013-05-02 NOTE — Tx Team (Signed)
Interdisciplinary Treatment Plan Update   Date Reviewed:  05/02/2013  Time Reviewed:  9:48 AM  Progress in Treatment:   Attending groups: Yes Participating in groups: Yes Taking medication as prescribed: Yes  Tolerating medication: Yes Family/Significant other contact made:  No, patient declined collateral contact Patient understands diagnosis: Yes  Discussing patient identified problems/goals with staff: Yes Medical problems stabilized or resolved: Yes Denies suicidal/homicidal ideation: Yes Patient has not harmed self or others: Yes  For review of initial/current patient goals, please see plan of care.  Estimated Length of Stay:  2-3 days  Reasons for Continued Hospitalization:  Anxiety Depression Medication stabilization   New Problems/Goals identified:    Discharge Plan or Barriers:   Home with outpatient follow up to be determined [facility]  Additional Comments:   Continue medication stabilization.   Attendees:  Patient:  05/02/2013 9:48 AM   Signature:  Mylinda Latina 05/02/2013 9:48 AM  Signature:   05/02/2013 9:48 AM  Signature:  Catalina Pizza, NP 05/02/2013 9:48 AM  Signature: Eduard Roux, RN 05/02/2013 9:48 AM  Signature:  Marilynne Halsted, RN 05/02/2013 9:48 AM  Signature:  Joette Catching, LCSW 05/02/2013 9:48 AM  Signature:  Regan Lemming, LCSW 05/02/2013 9:48 AM  Signature:  Norberto Sorenson, Care Coordinator [facility] 05/02/2013 9:48 AM  Signature:  Marshall Cork, RN 05/02/2013 9:48 AM  Signature: 05/02/2013  9:48 AM  Signature:   Lars Pinks, RN [facility] 05/02/2013  9:48 AM  Signature:   05/02/2013  9:48 AM    Scribe for Treatment Team:   Joette Catching,  05/02/2013 9:48 AM

## 2013-05-02 NOTE — Progress Notes (Signed)
Adult Psychoeducational Group Note  Date:  05/02/2013 Time:  10:00am Group Topic/Focus:  Personal Choices and Values:   The focus of this group is to help patients assess and explore the importance of values in their lives, how their values affect their decisions, how they express their values and what opposes their expression.  Participation Level:  Active  Participation Quality:  Appropriate and Attentive  Affect:  Appropriate  Cognitive:  Alert and Appropriate  Insight: Appropriate  Engagement in Group:  Engaged  Modes of Intervention:  Discussion and Education  Additional Comments: Pt attended and participated in group. Discussion was on personal development. Pt became very tearful when asked this question and ask that I move on to the next person.   [name] D 05/02/2013, 2:02 PM

## 2013-05-02 NOTE — [facility] Group Notes (Signed)
[facility] LCSW Aftercare Discharge Planning Group Note   05/02/2013 11:22 AM    Participation Quality:  Appropraite  Mood/Affect: Depressed, Tearful  Depression Rating:  5  Anxiety Rating:  6  Thoughts of Suicide:  No  Will you contract for safety?   NA  Current AVH:  No  Plan for Discharge/Comments:  Patient attended discharge planning group and actively participated in group. She reports not sleeping well last night.  She will follow up with [facility].  CSW provided all participants with daily workbook.   Transportation Means: Patient has transportation.   Supports:  Patient has a support system.   Orlie Cundari, Eulas Post

## 2013-05-03 DIAGNOSIS — F988 Other specified behavioral and emotional disorders with onset usually occurring in childhood and adolescence: Secondary | ICD-10-CM

## 2013-05-03 MED ORDER — NONFORMULARY OR COMPOUNDED ITEM
1.0000 "application " | Status: DC | PRN
  Administered 2013-05-08: 1 via TOPICAL

## 2013-05-03 MED ORDER — MODAFINIL 200 MG PO TABS
200.0000 mg | ORAL_TABLET | Freq: Every day | ORAL | Status: DC
  Administered 2013-05-03 – 2013-05-07 (×5): 200 mg via ORAL
  Filled 2013-05-03 (×5): qty 1

## 2013-05-03 NOTE — Progress Notes (Signed)
Patient ID: [name], female   DOB: Aug 25, 1945, 68 y.o.   MRN: [id number]  Morning Wellness Group 9 A.M.  The focus of this group is to educate the patient on the purpose and policies of crisis stabilization and provide a format to answer questions about their admission.  The group details unit policies and expectations of patients while admitted.  Patient attended group and participated in stretching exercises from her chair. Patient was able to make jewelry and make collages as a leisure activity. Patient's goal for the day is to go to all the groups.

## 2013-05-03 NOTE — Progress Notes (Signed)
[facility] MD Progress Note  05/03/2013 11:51 AM [name]  MRN:  [id number] Subjective:  [name] did not sleep well last night. States that she thinks the device for her sleep apnea is not working well. She is very tired, fatigue. She is having some mood instability. She has been crying during the morning. Still feels hopeless, helpless and wishes for her to be dead. Feels anxious, uncomfortable around the people here. She was isolating when she was at home and avoiding all contact. Here it is more difficult as there are people "every where." She cant focus too well. When she used the Adderall her BP went up so she has not been on stimulants since then. She is on the Wellbutrin XL 300 mg and has seen some palpitations. She states she is not too worried about this. She was started on Prozac 10 mg this AM. She stated early on that she had the best response to Prozac years ago when she was given a trial with it. She is not sure why she came off. Does not think it was because of side effects.  Diagnosis:   DSM5: Schizophrenia Disorders:  none Obsessive-Compulsive Disorders:  none Trauma-Stressor Disorders:  none Substance/Addictive Disorders:  none Depressive Disorders:  Major Depressive Disorder - Severe (296.23) Total Time spent with patient: 30 minutes  Axis I: ADHD, inattentive type and Generalized Anxiety Disorder  ADL's:  Intact  Sleep: Poor  Appetite:  Fair  Suicidal Ideation:  Plan:  denies Intent:  denies Means:  denies Homicidal Ideation:  Plan:  denies Intent:  denies Means:  denies AEB (as evidenced by):  Psychiatric Specialty Exam: Physical Exam  Review of Systems  Constitutional: Positive for malaise/fatigue.  HENT: Negative.   Respiratory: Negative.   Cardiovascular: Negative.   Gastrointestinal: Negative.   Genitourinary: Negative.   Musculoskeletal: Negative.   Skin: Negative.   Neurological: Positive for weakness.  Endo/Heme/Allergies: Negative.    Psychiatric/Behavioral: Positive for depression. The patient is nervous/anxious and has insomnia.     Blood pressure 94/63, pulse 70, temperature 97.7 F (36.5 C), temperature source Oral, resp. rate 16, height 5\' 3"  (1.6 m), weight 100.245 kg (221 lb).Body mass index is 39.16 kg/(m^2).  General Appearance: Fairly Groomed  Engineer, water::  Fair  Speech:  Clear and Coherent  Volume:  fluctuates  Mood:  Anxious, Depressed and worried  Affect:  sad, anxious, worried, tearful  Thought Process:  Coherent and Goal Directed  Orientation:  Full (Time, Place, and Person)  Thought Content:  symptoms, worries, concerns  Suicidal Thoughts:  passive  Homicidal Thoughts:  No  Memory:  Immediate;   Fair Recent;   Fair Remote;   Fair  Judgement:  Fair  Insight:  Present  Psychomotor Activity:  Restlessness  Concentration:  easily distracted  Recall:  Gates: Fair  Akathisia:  No  Handed:    AIMS (if indicated):     Assets:  Desire for Improvement  Sleep:  Number of Hours: 6   Musculoskeletal: Strength & Muscle Tone: within normal limits Gait & Station: normal Patient leans: N/A  Current Medications: Current Facility-Administered Medications  Medication Dose Route Frequency Provider Last Rate Last Dose  . acetaminophen (TYLENOL) tablet 650 mg  650 mg Oral Q6H PRN Lurena Nida, NP   650 mg at 04/30/13 0846  . alum & mag hydroxide-simeth (MAALOX/MYLANTA) 200-200-20 MG/5ML suspension 30 mL  30 mL Oral Q4H PRN Lurena Nida, NP      .  aspirin EC tablet 81 mg  81 mg Oral Daily Lurena Nida, NP   81 mg at 05/03/13 0818  . bisacodyl (DULCOLAX) suppository 10 mg  10 mg Rectal Once Benjamine Mola, FNP      . buPROPion (WELLBUTRIN XL) 24 hr tablet 300 mg  300 mg Oral Daily Nicholaus Bloom, MD   300 mg at 05/03/13 0820  . clotrimazole (GYNE-LOTRIMIN) vaginal cream 1 Applicatorful  1 Applicatorful Vaginal QHS Lurena Nida, NP   1 Applicatorful at 0000000 2200  .  docusate sodium (COLACE) capsule 100 mg  100 mg Oral BID Benjamine Mola, FNP   100 mg at 05/03/13 0820  . doxepin (SINEQUAN) capsule 25 mg  25 mg Oral QHS PRN,MR X 1 Benjamine Mola, FNP   25 mg at 05/02/13 2137  . FLUoxetine (PROZAC) capsule 10 mg  10 mg Oral Daily Nicholaus Bloom, MD   10 mg at 05/03/13 [id number]  . hydrochlorothiazide (HYDRODIURIL) tablet 25 mg  25 mg Oral Daily Nicholaus Bloom, MD   25 mg at 05/03/13 [id number]  . ibuprofen (ADVIL,MOTRIN) tablet 600 mg  600 mg Oral Q6H PRN Benjamine Mola, FNP   600 mg at 05/03/13 0033  . irbesartan (AVAPRO) tablet 300 mg  300 mg Oral Daily Nicholaus Bloom, MD   300 mg at 05/03/13 [id number]  . lamoTRIgine (LAMICTAL) tablet 150 mg  150 mg Oral BID Lurena Nida, NP   150 mg at 05/03/13 [id number]  . levothyroxine (SYNTHROID, LEVOTHROID) tablet 88 mcg  88 mcg Oral QAC breakfast Lurena Nida, NP   88 mcg at 05/03/13 0617  . LORazepam (ATIVAN) tablet 1 mg  1 mg Oral TID PRN Nicholaus Bloom, MD   1 mg at 04/30/13 0846  . magnesium hydroxide (MILK OF MAGNESIA) suspension 30 mL  30 mL Oral Daily PRN Lurena Nida, NP   30 mL at 05/01/13 0830  . modafinil (PROVIGIL) tablet 200 mg  200 mg Oral Daily Nicholaus Bloom, MD      . MUSCLE RUB CREA   Topical PRN Laverle Hobby, PA-C      . nebivolol (BYSTOLIC) tablet 2.5 mg  2.5 mg Oral Daily Lurena Nida, NP   2.5 mg at 05/03/13 0824  . ondansetron (ZOFRAN) tablet 4 mg  4 mg Oral Q8H PRN Waylan Boga, NP   4 mg at 04/30/13 1213  . pantoprazole (PROTONIX) EC tablet 40 mg  40 mg Oral Daily Benjamine Mola, FNP   40 mg at 05/03/13 [id number]  . rOPINIRole (REQUIP) tablet 0.5 mg  0.5 mg Oral QPC supper Nicholaus Bloom, MD   0.5 mg at 05/02/13 1854  . rOPINIRole (REQUIP) tablet 0.5 mg  0.5 mg Oral Daily PRN Nicholaus Bloom, MD   0.5 mg at 04/28/13 [id number]  . rOPINIRole (REQUIP) tablet 1 mg  1 mg Oral QHS Nicholaus Bloom, MD   1 mg at 05/02/13 2135    Lab Results: No results found for this or any previous visit (from the past 48 hour(s)).  Physical  Findings: AIMS: Facial and Oral Movements Muscles of Facial Expression: Severe Lips and Perioral Area: Severe Jaw: Severe Tongue: Severe,Extremity Movements Upper (arms, wrists, hands, fingers): Severe Lower (legs, knees, ankles, toes): Severe, Trunk Movements Neck, shoulders, hips: Severe, Overall Severity Severity of abnormal movements (highest score from questions above): Severe Incapacitation due to abnormal movements: Severe Patient's awareness of abnormal movements (rate only patient's report):  No Awareness, Dental Status Current problems with teeth and/or dentures?: No Does patient usually wear dentures?: No  CIWA:    COWS:     Treatment Plan Summary: Daily contact with patient to assess and evaluate symptoms and progress in treatment Medication management  Plan: Supportive approach/coping skills           CBT;mindfulness (challenge the catastrophic thinking the personalizing)           Pursue the Prozac 10 mg daily            Trial with Provigil 200 mg in AM (trying to address the effect of her sleep apnea)           Get the BP manually at least BID                         Medical Decision Making Problem Points:  Established problem, worsening (2), New problem, with no additional work-up planned (3) and Review of psycho-social stressors (1) Data Points:  Review of medication regiment & side effects (2) Review of new medications or change in dosage (2)  I certify that inpatient services furnished can reasonably be expected to improve the patient's condition.   Kelten Enochs A 05/03/2013, 11:51 AM

## 2013-05-03 NOTE — Progress Notes (Signed)
Recreation Therapy Notes  Date: 02.11.2015 Time: 2:45pm Location: [address] Day[address]   Group Topic: Anger Management  Goal Area(s) Addresses:  Patient will identify body's physical reaction to anger.  Patient will identify positive coping mechanisms to deal with anger.  Patient will select one coping mechanism of choice to use post d/c when experiencing anger.   Behavioral Response: Appropriate  Intervention: Art  Activity: Patients were divided into groups, one member was selected to be traced by LRT. Patients were asked to identify reactions to anger, using outline they were asked to place reactions on the corresponding section of the body. Patients were then asked to identify positive coping skills to use when experiencing anger, using the outline patients were asked to place the coping skills on the area of the body used to complete that coping skill.     Education: Anger Management, Discharge Planning, Coping Skills  Education Outcome: Acknowledges understanding   Clinical Observations/Feedback: Patient actively engaged in group session, working well with her team to identify reactions to anger, as well as coping skills. Patient contributed to group discussion, highlighting the importance of using a physical coping skill when angry to release anxiety and negative energy.     Laureen Ochs Syble Picco, LRT/CTRS   Londin Antone L 05/03/2013 9:35 AM

## 2013-05-03 NOTE — Progress Notes (Signed)
Recreation Therapy Notes  Animal-Assisted Activity/Therapy (AAA/T) Program Checklist/Progress Notes Patient Eligibility Criteria Checklist & Daily Group note for Rec Tx Intervention  Date: 02.12.2015 Time: 2:45pm Location: [address]    AAA/T Program Assumption of Risk Form signed by Patient/ or Parent Legal Guardian yes  Patient is free of allergies or sever asthma yes  Patient reports no fear of animals yes  Patient reports no history of cruelty to animals yes   Patient understands his/her participation is voluntary yes  Patient washes hands before animal contact yes  Patient washes hands after animal contact yes  Behavioral Response: Appropriate   Education: Hand Washing, Appropriate Animal Interaction   Education Outcome: Acknowledges understanding   Clinical Observations/Feedback: Patient interacted appropriately with group members, LRT and peers. Patient shared stories and information about animals she has had in the past.   Lane Hacker, LRT/CTRS  Lane Hacker 05/03/2013 5:24 PM

## 2013-05-03 NOTE — [facility] Group Notes (Signed)
[facility] LCSW Group Therapy  05/03/2013  1:15 PM   Type of Therapy:  Group Therapy  Participation Level:  Active  Participation Quality:  Attentive, Sharing and Supportive  Affect:  Depressed and Flat  Cognitive:  Alert and Oriented  Insight:  Developing/Improving and Engaged  Engagement in Therapy:  Developing/Improving, Engaged and Supportive  Modes of Intervention:  Activity, Clarification, Confrontation, Discussion, Education, Exploration, Limit-setting, Orientation, Problem-solving, Rapport Building, Reality Testing, Socialization and Support  Summary of Progress/Problems: Patient was attentive and engaged with speaker from [employer].  Patient was attentive to speaker while they shared their story of dealing with mental health and overcoming it.  Patient expressed interest in their programs and services and received information on their agency.  Patient processed ways they can relate to the speaker.     Marguarite Markov Horton, LCSW 05/03/2013 1:39 PM    

## 2013-05-03 NOTE — Progress Notes (Addendum)
Patient requested staff to get her [place] out of locker for her to use, stated Muscle Rub Crea did not help her pain.  Patient also wanted her shoes out of locker.  Dr. Sabra Heck said to put in order for [facility].  Pharmacist put in order.  Nurse went to locker, NO ICY COLD/HOT could be found.  Note made on belongings sheet.  Nurse did get patient's shoes for her which were put in her room.  Pt informed that ICY COLD could not be found.     D:  Patient's self inventory sheet, patient needs sleep medication, good appetite, low energy level, improving attention span.  Rated depression 5, hopeless 8, anxiety 7.  Denied withdrawals.  Denied SI.  Has experienced nausea and headaches in past 24 hours.  "Socializing, rereading notes taken during each group that are insightful, set up a routine for myself.  Find me a good Christian man !?"  No problems taking meds after discharge. A:  Medications administered per MD orders.  Emotional support and encouragement given patient. R:  Denied SI and HI.  Contracts for safety.  Will continue to monitor patient for safety with 15 minute checks.  Safety maintained.

## 2013-05-04 DIAGNOSIS — F39 Unspecified mood [affective] disorder: Secondary | ICD-10-CM

## 2013-05-04 NOTE — Progress Notes (Signed)
D) Pt has attended the groups and interacts with select peers. Denies SI and HI and rates her depression at a 5 and her hopelessness at an 8. Pt states that this is the first time in a long time that she has felt good about herself and "good just in general". Attributes this to the medication and being able to talk to her friend. A) given support, reassurance and praise. Encouragement given to Pt. Therapeutic listening provided. R) Denies SI and HI.

## 2013-05-04 NOTE — [facility] Group Notes (Signed)
[facility] LCSW Aftercare Discharge Planning Group Note   05/04/2013 9:42 AM  Participation Quality:  Active  Mood/Affect:  Anxious  Depression Rating:  Low 4  Anxiety Rating:  Higher reporting she felt overstimulated with all the people and noise  Thoughts of Suicide:  No Will you contract for safety?   Yes  Current AVH:  No  Plan for Discharge/Comments:  Patient reports high anxiety with results of too many people and noise on the unit. Patient reported she practiced her breathing and wants to follow up IOP and patient will also follow up TCT with [facility].  Transportation Means: Family/ friends  Supports: Family and friends  Curt Bears, Evie Lacks

## 2013-05-04 NOTE — [facility] Group Notes (Signed)
[facility] LCSW Group Therapy  05/04/2013 3:07 PM   Type of Therapy:  Group Therapy  Participation Level: Minimal  Participation Quality:  Attentive  Affect:  Appropriate  Cognitive:  Appropriate  Insight:  Improving  Engagement in Therapy:  Engaged  Modes of Intervention:  Clarification, Education, Exploration and Socialization  Summary of Progress/Problems: Today's group focused on relapse prevention.  We defined the term, and then brainstormed on ways to prevent relapse.  [name] was attentive throughout group, but did not contribute spontaneously.  When asked directly, stated that she has stopped medications in the past, which she knows will lead to "relapse," but at times just wants to be "normal" and not have to take meds like her friends.    Roque Lias B 05/04/2013 , 3:07 PM

## 2013-05-04 NOTE — Progress Notes (Signed)
[facility] MD Progress Note  05/04/2013 5:50 PM [name]  MRN:  [id number] Subjective:  [name] states that she had a lot of anxiety in the eveving, but  did sleep last night without any "sleeing pills" and woke up with more energy, motivation, did couple of things in her room "because it was expected." states that at home she would get up and not have the energy or the motivation to do anything. States that then she became more irritable and disgusted with some of the people around her. Usually compassionate was not feeling the compassion towards those peers. When discussed that the medication could be too activating, she asked not to change it as she has not felt any improvement in her energy level until now.  She was upset when friend did not come to have lunch with her but handled it better. She is also concerned about how the sleep apnea could be affecting her mood. Diagnosis:   DSM5: Schizophrenia Disorders:  none Obsessive-Compulsive Disorders:  none Trauma-Stressor Disorders:  none Substance/Addictive Disorders:  none Depressive Disorders:  Major Depressive Disorder - Moderate (296.22) Total Time spent with patient: 30 minutes  Axis I: ADHD, combined type and Mood Disorder NOS  ADL's:  Intact  Sleep: Fair  Appetite:  Fair  Suicidal Ideation:  Plan:  denies Intent:  denies Means:  denies Homicidal Ideation:  Plan:  denies Intent:  denies Means:  denies AEB (as evidenced by):  Psychiatric Specialty Exam: Physical Exam  Review of Systems  Constitutional: Negative.   HENT: Negative.   Eyes: Negative.   Respiratory: Negative.   Cardiovascular: Negative.   Gastrointestinal: Negative.   Genitourinary: Negative.   Musculoskeletal: Positive for back pain.  Skin: Negative.   Neurological: Negative.   Endo/Heme/Allergies: Negative.   Psychiatric/Behavioral: Positive for depression. The patient is nervous/anxious.     Blood pressure 127/69, pulse 88, temperature 97.8 F  (36.6 C), temperature source Oral, resp. rate 20, height 5\' 3"  (1.6 m), weight 100.245 kg (221 lb).Body mass index is 39.16 kg/(m^2).  General Appearance: Fairly Groomed  Engineer, water::  Fair  Speech:  Clear and Coherent  Volume:  fluctuates  Mood:  Anxious, Irritable and with more energy  Affect:  anxious, irritated  Thought Process:  Coherent and Goal Directed  Orientation:  Full (Time, Place, and Person)  Thought Content:  symptoms, worries concerns  Suicidal Thoughts:  No  Homicidal Thoughts:  No  Memory:  Immediate;   Fair Recent;   Fair Remote;   Fair  Judgement:  Fair  Insight:  Present  Psychomotor Activity:  Restlessness  Concentration:  Fair  Recall:  [employer] [place]: Fair  Akathisia:  No  Handed:    AIMS (if indicated):     Assets:  Desire for Improvement  Sleep:  Number of Hours: 6   Musculoskeletal: Strength & Muscle Tone: within normal limits Gait & Station: normal Patient leans: N/A  Current Medications: Current Facility-Administered Medications  Medication Dose Route Frequency Provider Last Rate Last Dose  . acetaminophen (TYLENOL) tablet 650 mg  650 mg Oral Q6H PRN Lurena Nida, NP   650 mg at 04/30/13 0846  . alum & mag hydroxide-simeth (MAALOX/MYLANTA) 200-200-20 MG/5ML suspension 30 mL  30 mL Oral Q4H PRN Lurena Nida, NP   30 mL at 05/03/13 2100  . aspirin EC tablet 81 mg  81 mg Oral Daily Lurena Nida, NP   81 mg at 05/04/13 0805  . bisacodyl (DULCOLAX)  suppository 10 mg  10 mg Rectal Once Benjamine Mola, FNP      . buPROPion (WELLBUTRIN XL) 24 hr tablet 300 mg  300 mg Oral Daily Nicholaus Bloom, MD   300 mg at 05/04/13 0806  . clotrimazole (GYNE-LOTRIMIN) vaginal cream 1 Applicatorful  1 Applicatorful Vaginal QHS Lurena Nida, NP   1 Applicatorful at [id number]  . docusate sodium (COLACE) capsule 100 mg  100 mg Oral BID Benjamine Mola, FNP   100 mg at 05/04/13 1732  . doxepin (SINEQUAN) capsule 25 mg  25 mg Oral QHS PRN,MR  X 1 Benjamine Mola, FNP   25 mg at 05/02/13 2137  . FLUoxetine (PROZAC) capsule 10 mg  10 mg Oral Daily Nicholaus Bloom, MD   10 mg at 05/04/13 0806  . hydrochlorothiazide (HYDRODIURIL) tablet 25 mg  25 mg Oral Daily Nicholaus Bloom, MD   25 mg at 05/04/13 0805  . ibuprofen (ADVIL,MOTRIN) tablet 600 mg  600 mg Oral Q6H PRN Benjamine Mola, FNP   600 mg at 05/04/13 [id number]  . Icy cold/HOt topical pain relief  1 application Topical PRN Nicholaus Bloom, MD      . irbesartan (AVAPRO) tablet 300 mg  300 mg Oral Daily Nicholaus Bloom, MD   300 mg at 05/04/13 0805  . lamoTRIgine (LAMICTAL) tablet 150 mg  150 mg Oral BID Lurena Nida, NP   150 mg at 05/04/13 1730  . levothyroxine (SYNTHROID, LEVOTHROID) tablet 88 mcg  88 mcg Oral QAC breakfast Lurena Nida, NP   88 mcg at 05/04/13 [id number]  . LORazepam (ATIVAN) tablet 1 mg  1 mg Oral TID PRN Nicholaus Bloom, MD   1 mg at 05/04/13 0808  . magnesium hydroxide (MILK OF MAGNESIA) suspension 30 mL  30 mL Oral Daily PRN Lurena Nida, NP   30 mL at 05/01/13 0830  . modafinil (PROVIGIL) tablet 200 mg  200 mg Oral Daily Nicholaus Bloom, MD   200 mg at 05/04/13 0815  . nebivolol (BYSTOLIC) tablet 2.5 mg  2.5 mg Oral Daily Lurena Nida, NP   2.5 mg at 05/04/13 0805  . ondansetron (ZOFRAN) tablet 4 mg  4 mg Oral Q8H PRN Waylan Boga, NP   4 mg at 04/30/13 1213  . pantoprazole (PROTONIX) EC tablet 40 mg  40 mg Oral Daily Benjamine Mola, FNP   40 mg at 05/04/13 [id number]  . rOPINIRole (REQUIP) tablet 0.5 mg  0.5 mg Oral QPC supper Nicholaus Bloom, MD   0.5 mg at 05/03/13 1812  . rOPINIRole (REQUIP) tablet 0.5 mg  0.5 mg Oral Daily PRN Nicholaus Bloom, MD   0.5 mg at 04/28/13 [id number]  . rOPINIRole (REQUIP) tablet 1 mg  1 mg Oral QHS Nicholaus Bloom, MD   1 mg at 05/03/13 2138    Lab Results: No results found for this or any previous visit (from the past 48 hour(s)).  Physical Findings: AIMS: Facial and Oral Movements Muscles of Facial Expression: None, normal Lips and Perioral Area: None,  normal Jaw: None, normal Tongue: None, normal,Extremity Movements Upper (arms, wrists, hands, fingers): None, normal Lower (legs, knees, ankles, toes): None, normal, Trunk Movements Neck, shoulders, hips: None, normal, Overall Severity Severity of abnormal movements (highest score from questions above): None, normal Incapacitation due to abnormal movements: None, normal Patient's awareness of abnormal movements (rate only patient's report): No Awareness, Dental Status Current problems with teeth and/or dentures?:  No Does patient usually wear dentures?: No  CIWA:  CIWA-Ar Total: 2 COWS:  COWS Total Score: 2  Treatment Plan Summary: Daily contact with patient to assess and evaluate symptoms and progress in treatment Medication management  Plan: Supportive approach/coping skills           CBT; address personalization                     Mindfulness            Continue Wellbutrin/Prozac/Provigil but be mindful of too much activation. She is aware of this and will report to staff  Medical Decision Making Problem Points:  Review of psycho-social stressors (1) Data Points:  Review of medication regiment & side effects (2) Review of new medications or change in dosage (2)  I certify that inpatient services furnished can reasonably be expected to improve the patient's condition.   Jett Fukuda A 05/04/2013, 5:50 PM

## 2013-05-04 NOTE — Progress Notes (Signed)
D: Pt denies SI/HI/AVH. Pt is pleasant and cooperative.  A: Pt was offered support and encouragement. Pt was given scheduled medications. Pt was encourage to attend groups. Q 15 minute checks were done for safety.   R:Pt attends groups and interacts well with peers and staff. Pt is taking medication.Pt receptive to treatment and safety maintained on unit.

## 2013-05-05 DIAGNOSIS — F909 Attention-deficit hyperactivity disorder, unspecified type: Secondary | ICD-10-CM

## 2013-05-05 DIAGNOSIS — F319 Bipolar disorder, unspecified: Secondary | ICD-10-CM

## 2013-05-05 DIAGNOSIS — F39 Unspecified mood [affective] disorder: Secondary | ICD-10-CM

## 2013-05-05 NOTE — Progress Notes (Signed)
Patient ID: [name], female   DOB: [birth year], 68 y.o.   MRN: [id number] [facility] MD Progress Note  05/05/2013 3:15 PM [name]  MRN:  [id number] Subjective:  Patient remains labile at times, crying in the hallway with the nurse but quickly recovered in my assessment and laughing, saying she feels great.  She stated she was tearful over a group discussion.  Appetite has decreased which the patient is happy about and contributes this to her balanced diet here (Wellbutrin should also be contributing to this).  She does have nausea on a regular basis in the morning, often relieved with ginger ale.  [place] has decided to have the sleep apnea surgery to relieve apnea side effects.  Her mouth appliance is not effective treatment and her CPAP she could not tolerate. Diagnosis:   DSM5:  Total Time spent with patient: 30 minutes  Axis I: ADHD, combined type and Mood Disorder NOS; Bipolar disorder  ADL's:  Intact  Sleep: Fair  Appetite:  Fair  Suicidal Ideation:  Plan:  denies Intent:  denies Means:  denies Homicidal Ideation:  Plan:  denies Intent:  denies Means:  denies AEB (as evidenced by):  Psychiatric Specialty Exam: Physical Exam  Review of Systems  Constitutional: Negative.   HENT: Negative.   Eyes: Negative.   Respiratory: Negative.   Cardiovascular: Negative.   Gastrointestinal: Negative.   Genitourinary: Negative.   Musculoskeletal: Positive for back pain.  Skin: Negative.   Neurological: Negative.   Endo/Heme/Allergies: Negative.   Psychiatric/Behavioral: Positive for depression. The patient is nervous/anxious.     Blood pressure 124/86, pulse 82, temperature 98 F (36.7 C), temperature source Oral, resp. rate 16, height 5\' 3"  (1.6 m), weight 100.245 kg (221 lb).Body mass index is 39.16 kg/(m^2).  General Appearance: Fairly Groomed  Engineer, water::  Fair  Speech:  Clear and Coherent  Volume:  fluctuates  Mood:  Euthymic but labile  Affect:  Congruent  with mood  Thought Process:  Coherent and Goal Directed  Orientation:  Full (Time, Place, and Person)  Thought Content:  symptoms, worries concerns  Suicidal Thoughts:  No  Homicidal Thoughts:  No  Memory:  Immediate;   Fair Recent;   Fair Remote;   Fair  Judgement:  Fair  Insight:  Present  Psychomotor Activity:  Normal  Concentration:  Fair  Recall:  [employer] [place]  Language: Fair  Akathisia:  No  Handed:    AIMS (if indicated):     Assets:  Desire for Improvement  Sleep:  Number of Hours: 4.75   Musculoskeletal: Strength & Muscle Tone: within normal limits Gait & Station: normal Patient leans: N/A  Current Medications: Current Facility-Administered Medications  Medication Dose Route Frequency Provider Last Rate Last Dose  . acetaminophen (TYLENOL) tablet 650 mg  650 mg Oral Q6H PRN Lurena Nida, NP   650 mg at 04/30/13 0846  . alum & mag hydroxide-simeth (MAALOX/MYLANTA) 200-200-20 MG/5ML suspension 30 mL  30 mL Oral Q4H PRN Lurena Nida, NP   30 mL at 05/03/13 2100  . aspirin EC tablet 81 mg  81 mg Oral Daily Lurena Nida, NP   81 mg at 05/05/13 [id number]  . bisacodyl (DULCOLAX) suppository 10 mg  10 mg Rectal Once Benjamine Mola, FNP      . buPROPion (WELLBUTRIN XL) 24 hr tablet 300 mg  300 mg Oral Daily Nicholaus Bloom, MD   300 mg at 05/05/13 0830  . docusate sodium (COLACE)  capsule 100 mg  100 mg Oral BID Benjamine Mola, FNP   100 mg at 05/05/13 0830  . doxepin (SINEQUAN) capsule 25 mg  25 mg Oral QHS PRN,MR X 1 Benjamine Mola, FNP   25 mg at 05/02/13 2137  . FLUoxetine (PROZAC) capsule 10 mg  10 mg Oral Daily Nicholaus Bloom, MD   10 mg at 05/05/13 0830  . hydrochlorothiazide (HYDRODIURIL) tablet 25 mg  25 mg Oral Daily Nicholaus Bloom, MD   25 mg at 05/05/13 5400  . ibuprofen (ADVIL,MOTRIN) tablet 600 mg  600 mg Oral Q6H PRN Benjamine Mola, FNP   600 mg at 05/05/13 0428  . Icy cold/HOt topical pain relief  1 application Topical PRN Nicholaus Bloom, MD      .  irbesartan (AVAPRO) tablet 300 mg  300 mg Oral Daily Nicholaus Bloom, MD   300 mg at 05/05/13 8676  . lamoTRIgine (LAMICTAL) tablet 150 mg  150 mg Oral BID Lurena Nida, NP   150 mg at 05/05/13 1950  . levothyroxine (SYNTHROID, LEVOTHROID) tablet 88 mcg  88 mcg Oral QAC breakfast Lurena Nida, NP   88 mcg at 05/05/13 9326  . LORazepam (ATIVAN) tablet 1 mg  1 mg Oral TID PRN Nicholaus Bloom, MD   1 mg at 05/05/13 0139  . magnesium hydroxide (MILK OF MAGNESIA) suspension 30 mL  30 mL Oral Daily PRN Lurena Nida, NP   30 mL at 05/01/13 0830  . modafinil (PROVIGIL) tablet 200 mg  200 mg Oral Daily Nicholaus Bloom, MD   200 mg at 05/05/13 7124  . nebivolol (BYSTOLIC) tablet 2.5 mg  2.5 mg Oral Daily Lurena Nida, NP   2.5 mg at 05/05/13 0830  . ondansetron (ZOFRAN) tablet 4 mg  4 mg Oral Q8H PRN Waylan Boga, NP   4 mg at 04/30/13 1213  . pantoprazole (PROTONIX) EC tablet 40 mg  40 mg Oral Daily Benjamine Mola, FNP   40 mg at 05/05/13 5809  . rOPINIRole (REQUIP) tablet 0.5 mg  0.5 mg Oral QPC supper Nicholaus Bloom, MD   0.5 mg at 05/04/13 1816  . rOPINIRole (REQUIP) tablet 0.5 mg  0.5 mg Oral Daily PRN Nicholaus Bloom, MD   0.5 mg at 04/28/13 9833  . rOPINIRole (REQUIP) tablet 1 mg  1 mg Oral QHS Nicholaus Bloom, MD   1 mg at 05/04/13 2109    Lab Results: No results found for this or any previous visit (from the past 81 hour(s)).  Physical Findings: AIMS: Facial and Oral Movements Muscles of Facial Expression: None, normal Lips and Perioral Area: None, normal Jaw: None, normal Tongue: None, normal,Extremity Movements Upper (arms, wrists, hands, fingers): None, normal Lower (legs, knees, ankles, toes): None, normal, Trunk Movements Neck, shoulders, hips: None, normal, Overall Severity Severity of abnormal movements (highest score from questions above): None, normal Incapacitation due to abnormal movements: None, normal Patient's awareness of abnormal movements (rate only patient's report): No  Awareness, Dental Status Current problems with teeth and/or dentures?: No Does patient usually wear dentures?: No  CIWA:  CIWA-Ar Total: 2 COWS:  COWS Total Score: 2  Treatment Plan Summary: Daily contact with patient to assess and evaluate symptoms and progress in treatment Medication management  Plan:  Review of chart, vital signs, medications, and notes. 1-Individual and group therapy 2-Medication management for depression and anxiety:  Medications reviewed with the patient and she stated no untoward effects,  no changes made 3-Coping skills for depression, anxiety 4-Continue crisis stabilization and management 5-Address health issues--monitoring vital signs, stable 6-Treatment plan in progress to prevent relapse of depression and anxiety    Medical Decision Making Problem Points:  Review of psycho-social stressors (1) Data Points:  Review of medication regiment & side effects (2) Review of new medications or change in dosage (2)  I certify that inpatient services furnished can reasonably be expected to improve the patient's condition.   Waylan Boga, [place] 05/05/2013, 3:15 PM  Patient seen, evaluated and I agree with notes by Nurse Practitioner. Corena Pilgrim, MD

## 2013-05-05 NOTE — Progress Notes (Signed)
Adult Psychoeducational Group Note  Date:  05/05/2013 Time:  1:19 AM  Group Topic/Focus:  Wrap-Up Group:   The focus of this group is to help patients review their daily goal of treatment and discuss progress on daily workbooks.  Participation Level:  Active  Participation Quality:  Appropriate  Affect:  Appropriate  Cognitive:  Appropriate  Insight: Appropriate and Good  Engagement in Group:  Engaged  Modes of Intervention:  Discussion  Additional Comments:   Pt attended wrap-up group this evening and participated in group with peers.  Gracen Southwell A 05/05/2013, 1:19 AM

## 2013-05-05 NOTE — Progress Notes (Signed)
D) Pt requested a 1:1 today and talked about her decreased self esteem and how she feels as though she doesn't always fit in. States it has a lot to do with her illness and not always knowing if she is saying the right thing. Feels ostracized by groups that she may join. Rates her depression at a 4 and her hopelessness at a 7. Denies SI and HI A) Active listening provided. Talked with Pt about different options or groups that she could join or activities that she could do by herself or to join others. Talked with Pt about how she is not kind to herself and is verbally abusive to herself. R) Pt. Calmer  And less down on herself this evening.

## 2013-05-05 NOTE — Progress Notes (Signed)
[facility] Group Notes:  (Nursing/MHT/Case Management/Adjunct)  Date:  05/05/2013  Time:  11:42 PM  Type of Therapy:  Group Therapy  Participation Level:  Active  Participation Quality:  Appropriate  Affect:  Appropriate  Cognitive:  Appropriate  Insight:  Appropriate  Engagement in Group:  Engaged  Modes of Intervention:  Socialization and Support  Summary of Progress/Problems: Pt. Stated she had a good day and "felt level."  Pt. Stated is was helpful to see self and would use her support system.  Lanell Persons 05/05/2013, 11:42 PM

## 2013-05-05 NOTE — Progress Notes (Signed)
Writer has observed patient up in the dayroom interacting with select peers. She attended group this evening and participated. She reports when we spoke 1:1 that she felt very irritable last evening and felt that she was annoyed by the least little things. She reports sleeping a little better last evening. She plan to do outpatient here once discharged and currently denies si/hi/a/v hallucinations. Encouraged her to follow through with her discharge plans. She requested her scheduled medications and prepared for bed. Safety maintained on unit with 15 min checks.

## 2013-05-05 NOTE — B[facility] Group Notes (Signed)
[facility] Group Notes: (Clinical Social Work)   05/05/2013      Type of Therapy:  Group Therapy   Participation Level:  Did Not Attend    Selmer Dominion, LCSW 05/05/2013, 3:35 PM

## 2013-05-06 NOTE — BHH Group Notes (Signed)
[facility] Group Notes:  (Clinical Social Work)  05/06/2013   1:15-2:15PM  Summary of Progress/Problems:  The main focus of today's process group was to   identify the patient's current support system and decide on other supports that can be put in place.  The picture on workbook was used to discuss why additional supports are needed.  An emphasis was placed on using counselor, doctor, therapy groups, 12-step groups, and problem-specific support groups to expand supports.   There was also an extensive discussion about what constitutes a healthy support versus an unhealthy support.  The patient expressed full comprehension of the concepts presented, and agreed that there is a need to add more supports.  The patient was late to group and mostly listened.  Type of Therapy:  Process Group  Participation Level:  Active  Participation Quality:  Attentive and Sharing  Affect:  Blunted  Cognitive:  Appropriate and Oriented  Insight:  Developing/Improving  Engagement in Therapy:  Engaged  Modes of Intervention:  Education,  Support and [employer], LCSW 05/06/2013, 4:00pm

## 2013-05-06 NOTE — Progress Notes (Signed)
Writer spoke with patient 1:1 and she reports that her day has been good, she had a friend to visit her today which she reports was nice to see him. She did report that she has had issues with feeling nauseous a couple of times today and has requested ginger ale which relieved it. Writer encouraged her to bring this to her doctors attention and make sure one of her medications are not causing this. Patient c/o memory problems and how it is embarrassing when it happens. She has been observed up in the dayroom watching tv and interacting with peers appropriately. She currently denies si/hi/a/v hallucinations. Safety maintained on unit with 15 min checks.

## 2013-05-06 NOTE — Progress Notes (Addendum)
D) Pt has attended the groups and interacts with her peers. Pt approaches this writer frequently throughout the day for reassurance and to "just ask a question". Writes things down so she won't forget them. Feels that her memory is bad. Requested a 1:1 to talk about things she can do after she leaves the hospital to care for herself better. Plans to make out a schedule and follow it. Rates her depression at a 4 and her hopelessness at a 7. Denies SI and HI. A) Given support, reassurance and praise. Encouragement given. Praised for her ability to be able to realize her needs to create a healthy area for herself. R) Denies SI and HI.

## 2013-05-06 NOTE — Progress Notes (Signed)
Psychoeducational Group Note  Date: 05/06/2013 Time:  0930 Group Topic/Focus:  Gratefulness:  The focus of this group is to help patients identify what two things they are most grateful for in their lives. What helps ground them and to center them on their work to their recovery.  Participation Level:  Active  Participation Quality:  Appropriate  Affect:  Appropriate  Cognitive:  Oriented  Insight:  Improving  Engagement in Group:  Engaged  Additional Comments:  participated in the group.  Grayson Pfefferle A   

## 2013-05-06 NOTE — Progress Notes (Signed)
Psychoeducational Group Note  Date:  05/06/2013 Time:  1015  Group Topic/Focus:  Making Healthy Choices:   The focus of this group is to help patients identify negative/unhealthy choices they were using prior to admission and identify positive/healthier coping strategies to replace them upon discharge.  Participation Level:  Active  Participation Quality:  Appropriate  Affect:  Appropriate  Cognitive:  Oriented  Insight:  Improving  Engagement in Group:  Engaged  Additional Comments:    Kailoni Vahle A 05/06/2013 

## 2013-05-06 NOTE — Progress Notes (Signed)
Late entry for 05-05-2013 written on 05-06-2013   Psychoeducational Group Note  Date: 05/06/2013 Time:  1015  Group Topic/Focus:  Identifying Needs:   The focus of this group is to help patients identify their personal needs that have been historically problematic and identify healthy behaviors to address their needs.  Participation Level:  Active  Participation Quality:  Appropriate  Affect:  Appropriate  Cognitive:  Oriented  Insight:  Improving  Engagement in Group:  Engaged  Additional Comments:   Andrea Ferrer A 

## 2013-05-06 NOTE — Progress Notes (Signed)
Adult Psychoeducational Group Note  Date:  05/06/2013 Time:  8:00 pm  Group Topic/Focus:  Wrap-Up Group:   The focus of this group is to help patients review their daily goal of treatment and discuss progress on daily workbooks.  Participation Level:  Active  Participation Quality:  Appropriate and Sharing  Affect:  Appropriate  Cognitive:  Appropriate  Insight: Appropriate  Engagement in Group:  Engaged  Modes of Intervention:  Discussion, Education, Socialization and Support  Additional Comments:  Pt stated that she has been able to learn more coping skills. Pt stated that keeping boundaries is important for her and being nice to herself. Pt stated that she is honest and a good friend.   Wynetta Emery, Taji Sather 05/06/2013, 10:41 PM

## 2013-05-06 NOTE — Progress Notes (Signed)
Late entry for 05/05/2013. Written on 05/06/2013  Psychoeducational Group Note    Date: 05/06/2013 Time:  0930  Goal Setting Purpose of Group: To be able to set a goal that is measurable and that can be accomplished in one day Participation Level:  Active  Participation Quality:  Appropriate  Affect:  Appropriate  Cognitive:  Oriented  Insight:  Improving  Engagement in Group:  Engaged  Additional Comments:  Participating and engaged.  [name] A 
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
## 2013-05-07 NOTE — BHH Group Notes (Signed)
[facility] LCSW Group Therapy          Overcoming Obstacles       1:15 -2:30        05/07/2013   2:48 PM     Type of Therapy:  Group Therapy  Participation Level:  Appropriate  Participation Quality:  Appropriate  Affect:  Appropriate, Alert  Cognitive:  Attentive Appropriate  Insight: Developing/Improving Engaged  Engagement in Therapy: Developing/Imprvoing Engaged  Modes of Intervention:  Discussion Exploration  Education Rapport BuildingProblem-Solving Support  Summary of Progress/Problems:  The main focus of today's group was overcoming obstacles.  She share she fails to admit to herself that she is not doing well until she is at the point of being severely depressed.  Patient able to identify appropriate coping skills.   Concha Pyo 05/07/2013   2:48 PM

## 2013-05-07 NOTE — Progress Notes (Signed)
Patient has been observed by writer up in the dayroom interacting with select peers appropriately. Writer spoke with her 1:1 and she reports that it has been a good day but somewhat draining emotionally. She reports that she has some things she needs to take care of that she has been putting off but did not elaborate with writer what she was referencing this to.  She reports that she has learned more coping skills from the groups since being here and plans to use them after discharge. Support and encouragement offered, safety maintained on unit with 15 min checks.

## 2013-05-07 NOTE — BHH Group Notes (Signed)
[facility] LCSW Aftercare Discharge Planning Group Note   05/07/2013 9:49 AM  Participation Quality:  Did not attend meeting.   Ronak Duquette, Eulas Post

## 2013-05-07 NOTE — Progress Notes (Signed)
D Pt. Denies SI and HI,  No complaints of pain or discomfort at this time.  A Writer offered support and encouragement.  Discussed coping skills with pt.  R Pt. Remains safe on the unit.   Reports that she was very anxious last night and early AM but has realized that she does not have to take care of everything STAT that is on her mind.  Pt. rates her anxiety at a 3 now down from a 10, and her depression also at a 3 down from a 10.  States she has learned it is okay to reach out for help when she feels overwhelmed, and she has learned to take care of self first.

## 2013-05-07 NOTE — Tx Team (Signed)
Interdisciplinary Treatment Plan Update   Date Reviewed:  05/07/2013  Time Reviewed:  8:28 AM  Progress in Treatment:   Attending groups: Yes Participating in groups: Yes Taking medication as prescribed: Yes  Tolerating medication: Yes Family/Significant other contact made:  No, patient declined collateral contact Patient understands diagnosis: Yes  Discussing patient identified problems/goals with staff: Yes Medical problems stabilized or resolved: Yes Denies suicidal/homicidal ideation: Yes Patient has not harmed self or others: Yes  For review of initial/current patient goals, please see plan of care.  Estimated Length of Stay:  1 days  Reasons for Continued Hospitalization:  Anxiety Depression Medication stabilization   New Problems/Goals identified:    Discharge Plan or Barriers:   Home with outpatient follow up to be determined [facility]  Additional Comments:    Attendees:  Patient:  05/07/2013 8:28 AM   Signature:  05/07/2013 8:28 AM  Signature:   05/07/2013 8:28 AM  Signature:  Catalina Pizza, NP 05/07/2013 8:28 AM  Signature:  Grayland Ormond,  RN 05/07/2013 8:28 AM  Signature:  Thurnell Garbe, RN 05/07/2013 8:28 AM  Signature:  Joette Catching, LCSW 05/07/2013 8:28 AM  Signature:  Regan Lemming, LCSW 05/07/2013 8:28 AM  Signature:  Norberto Sorenson, Care Coordinator [facility] 05/07/2013 8:28 AM  Signature:  Marshall Cork, RN 05/07/2013 8:28 AM  Signature: 05/07/2013  8:28 AM  Signature:   Lars Pinks, RN [facility] 05/07/2013  8:28 AM  Signature:   05/07/2013  8:28 AM    Scribe for Treatment Team:   Joette Catching,  05/07/2013 8:28 AM

## 2013-05-07 NOTE — Progress Notes (Signed)
[facility] MD Progress Note  05/07/2013 4:08 PM [name]  MRN:  [id number] Subjective:  [name] states she has had episodes of irritability, anxiety. States the anxiety builds up. She tends to isolate. She was able to sleep last night without any sleeping aids. She would rather let go of the Provigil. She states she needs to make the necessary changes in her life to get back out, be involved. States the depression had not left her do this. She is also concerned for the status of her cancer as well as her sleep apnea.  Diagnosis:   DSM5: Schizophrenia Disorders:  none Obsessive-Compulsive Disorders:  none Trauma-Stressor Disorders:  none Substance/Addictive Disorders:  none Depressive Disorders:  Major Depressive Disorder - Severe (296.23) Total Time spent with patient: 30 minutes  Axis I: Generalized Anxiety Disorder and Mood Disorder NOS  ADL's:  Intact  Sleep: Fair  Appetite:  Fair  Suicidal Ideation:  Plan:  denies Intent:  denies Means:  denies Homicidal Ideation:  Plan:  denies Intent:  denies Means:  denies AEB (as evidenced by):  Psychiatric Specialty Exam: Physical Exam  Review of Systems  Constitutional: Negative.   HENT: Negative.   Eyes: Negative.   Respiratory: Negative.   Cardiovascular: Negative.   Gastrointestinal: Negative.   Genitourinary: Negative.   Musculoskeletal: Negative.   Skin: Negative.   Neurological: Negative.   Endo/Heme/Allergies: Negative.   Psychiatric/Behavioral: Positive for depression. The patient is nervous/anxious.     Blood pressure 117/80, pulse 94, temperature 98 F (36.7 C), temperature source Oral, resp. rate 18, height 5\' 3"  (1.6 m), weight 100.245 kg (221 lb).Body mass index is 39.16 kg/(m^2).  General Appearance: Fairly Groomed  Engineer, water::  Fair  Speech:  Clear and Coherent  Volume:  fluctuates  Mood:  Anxious, Depressed and worried, wiht some irritability  Affect:  Restricted  Thought Process:  Coherent and Goal  Directed  Orientation:  Full (Time, Place, and Person)  Thought Content:  symtpoms, worries, concerns  Suicidal Thoughts:  No  Homicidal Thoughts:  No  Memory:  Immediate;   Fair Recent;   Poor Remote;   Fair  Judgement:  Fair  Insight:  Present  Psychomotor Activity:  Restlessness  Concentration:  Fair  Recall:  [employer] [place]: Fair  Akathisia:  No  Handed:    AIMS (if indicated):     Assets:  Desire for Improvement Housing  Sleep:  Number of Hours: 6.25   Musculoskeletal: Strength & Muscle Tone: within normal limits Gait & Station: normal Patient leans: N/A  Current Medications: Current Facility-Administered Medications  Medication Dose Route Frequency Provider Last Rate Last Dose  . acetaminophen (TYLENOL) tablet 650 mg  650 mg Oral Q6H PRN Lurena Nida, NP   650 mg at 04/30/13 0846  . alum & mag hydroxide-simeth (MAALOX/MYLANTA) 200-200-20 MG/5ML suspension 30 mL  30 mL Oral Q4H PRN Lurena Nida, NP   30 mL at 05/07/13 1452  . aspirin EC tablet 81 mg  81 mg Oral Daily Lurena Nida, NP   81 mg at 05/07/13 5638  . bisacodyl (DULCOLAX) suppository 10 mg  10 mg Rectal Once Benjamine Mola, FNP      . buPROPion (WELLBUTRIN XL) 24 hr tablet 300 mg  300 mg Oral Daily Nicholaus Bloom, MD   300 mg at 05/07/13 0836  . docusate sodium (COLACE) capsule 100 mg  100 mg Oral BID Benjamine Mola, FNP   100 mg at 05/07/13  1191  . doxepin (SINEQUAN) capsule 25 mg  25 mg Oral QHS PRN,MR X 1 Benjamine Mola, FNP   25 mg at 05/02/13 2137  . FLUoxetine (PROZAC) capsule 10 mg  10 mg Oral Daily Nicholaus Bloom, MD   10 mg at 05/07/13 4782  . hydrochlorothiazide (HYDRODIURIL) tablet 25 mg  25 mg Oral Daily Nicholaus Bloom, MD   25 mg at 05/07/13 [id number]  . ibuprofen (ADVIL,MOTRIN) tablet 600 mg  600 mg Oral Q6H PRN Benjamine Mola, FNP   600 mg at 05/07/13 1308  . Icy cold/HOt topical pain relief  1 application Topical PRN Nicholaus Bloom, MD      . irbesartan (AVAPRO) tablet 300 mg   300 mg Oral Daily Nicholaus Bloom, MD   300 mg at 05/07/13 0835  . lamoTRIgine (LAMICTAL) tablet 150 mg  150 mg Oral BID Lurena Nida, NP   150 mg at 05/07/13 0835  . levothyroxine (SYNTHROID, LEVOTHROID) tablet 88 mcg  88 mcg Oral QAC breakfast Lurena Nida, NP   88 mcg at 05/07/13 0636  . LORazepam (ATIVAN) tablet 1 mg  1 mg Oral TID PRN Nicholaus Bloom, MD   1 mg at 05/07/13 6578  . magnesium hydroxide (MILK OF MAGNESIA) suspension 30 mL  30 mL Oral Daily PRN Lurena Nida, NP   30 mL at 05/01/13 0830  . modafinil (PROVIGIL) tablet 200 mg  200 mg Oral Daily Nicholaus Bloom, MD   200 mg at 05/07/13 4696  . nebivolol (BYSTOLIC) tablet 2.5 mg  2.5 mg Oral Daily Lurena Nida, NP   2.5 mg at 05/07/13 0836  . ondansetron (ZOFRAN) tablet 4 mg  4 mg Oral Q8H PRN Waylan Boga, NP   4 mg at 05/06/13 1726  . pantoprazole (PROTONIX) EC tablet 40 mg  40 mg Oral Daily Benjamine Mola, FNP   40 mg at 05/07/13 2952  . rOPINIRole (REQUIP) tablet 0.5 mg  0.5 mg Oral QPC supper Nicholaus Bloom, MD   0.5 mg at 05/06/13 8413  . rOPINIRole (REQUIP) tablet 0.5 mg  0.5 mg Oral Daily PRN Nicholaus Bloom, MD   0.5 mg at 04/28/13 2440  . rOPINIRole (REQUIP) tablet 1 mg  1 mg Oral QHS Nicholaus Bloom, MD   1 mg at 05/06/13 2054    Lab Results: No results found for this or any previous visit (from the past 48 hour(s)).  Physical Findings: AIMS: Facial and Oral Movements Muscles of Facial Expression: None, normal Lips and Perioral Area: None, normal Jaw: None, normal Tongue: None, normal,Extremity Movements Upper (arms, wrists, hands, fingers): None, normal Lower (legs, knees, ankles, toes): None, normal, Trunk Movements Neck, shoulders, hips: None, normal, Overall Severity Severity of abnormal movements (highest score from questions above): None, normal Incapacitation due to abnormal movements: None, normal Patient's awareness of abnormal movements (rate only patient's report): No Awareness, Dental Status Current problems  with teeth and/or dentures?: No Does patient usually wear dentures?: No  CIWA:  CIWA-Ar Total: 2 COWS:  COWS Total Score: 2  Treatment Plan Summary: Daily contact with patient to assess and evaluate symptoms and progress in treatment Medication management  Plan: Supportive approach/coping skills           CBT;mindfulness identify distorted thinking           Identify life style changes that could help better manage her mood and anxiety  Medical Decision Making Problem Points:  Review of  psycho-social stressors (1) Data Points:  Review of new medications or change in dosage (2)  I certify that inpatient services furnished can reasonably be expected to improve the patient's condition.   Kimbley Sprague A 05/07/2013, 4:08 PM

## 2013-05-07 NOTE — Progress Notes (Signed)
Patient ID: [name], female   DOB: 11/09/1945, 68 y.o.   MRN: [id number] D Patient reports she slept well and her appetite is poor.  Her energy level is low and her ability to pay attention is improving.  She rates her depression at 4/10 and her hopelessness at 7/10.  She did not feel well this am and did not go to her discharge planning group because of a severe headache and nausea.  A- Gave patient medication for headache .  R- Patient feeling better and participating in group later in day.  She says that she was feeling overwhelmed by a problem she needs to deal with , but realized she does not need to deal with it immediately  .  This was a big relief to her.

## 2013-05-08 MED ORDER — VALSARTAN-HYDROCHLOROTHIAZIDE 320-25 MG PO TABS: 1.0000 | ORAL_TABLET | Freq: Every day | ORAL | Status: DC

## 2013-05-08 MED ORDER — PANTOPRAZOLE SODIUM 40 MG PO TBEC: 40.0000 mg | DELAYED_RELEASE_TABLET | Freq: Every day | ORAL | Status: DC

## 2013-05-08 MED ORDER — DOXEPIN HCL 25 MG PO CAPS: 25.0000 mg | ORAL_CAPSULE | Freq: Every evening | ORAL | Status: DC | PRN

## 2013-05-08 MED ORDER — NEBIVOLOL HCL 2.5 MG PO TABS: 2.5000 mg | ORAL_TABLET | Freq: Every day | ORAL | Status: DC

## 2013-05-08 MED ORDER — BUPROPION HCL ER (XL) 300 MG PO TB24: 300.0000 mg | ORAL_TABLET | Freq: Every day | ORAL | Status: DC

## 2013-05-08 MED ORDER — LEVOTHYROXINE SODIUM 88 MCG PO TABS: 88.0000 ug | ORAL_TABLET | Freq: Every day | ORAL | Status: DC

## 2013-05-08 MED ORDER — ROPINIROLE HCL 0.5 MG PO TABS: 0.5000 mg | ORAL_TABLET | Freq: Two times a day (BID) | ORAL | Status: DC

## 2013-05-08 MED ORDER — ASPIRIN 81 MG PO TBEC: 81.0000 mg | DELAYED_RELEASE_TABLET | Freq: Every day | ORAL | Status: DC

## 2013-05-08 MED ORDER — LORAZEPAM 1 MG PO TABS: 1.0000 mg | ORAL_TABLET | Freq: Three times a day (TID) | ORAL | Status: DC | PRN

## 2013-05-08 MED ORDER — FLUOXETINE HCL 10 MG PO CAPS: 10.0000 mg | ORAL_CAPSULE | Freq: Every day | ORAL | Status: DC

## 2013-05-08 MED ORDER — DSS 100 MG PO CAPS: 100.0000 mg | ORAL_CAPSULE | Freq: Two times a day (BID) | ORAL | Status: DC

## 2013-05-08 MED ORDER — LAMOTRIGINE 150 MG PO TABS: 150.0000 mg | ORAL_TABLET | Freq: Two times a day (BID) | ORAL | Status: DC

## 2013-05-08 NOTE — Progress Notes (Signed)
Recreation Therapy Notes  Date: 02.16.2015 Time: 2:45pm Location: [address] Dayroom   Group Topic: Wellness  Goal Area(s) Addresses:  Patient will define components of whole wellness. Patient will verbalize benefit of whole wellness. Patient will identified how neglecting wellness exacerbates depression/anxiety/substance abuse/ etc.  Behavioral Response: Engaged, Sharing, Appropriative   Intervention: Mind Map  Activity: Patients were asked to identify and define dimensions of wellness - Physical, Mental, Emotional, Financial, Social, Leisure, Intellectual, Environmental, and Spiritual. Patient were then asked to identify what activities/actions they can participate in to invest in those dimensions of wellness. Discussion focused on meaningful investment in wellness and consequences for neglecting wellness.   Education: Wellness, Discharge Planning   Education Outcome: Acknowledges understanding  Clinical Observations/Feedback: Patient actively engaged in group activity, identifying and defining dimensions with group members. Patient shared areas that she neglects when she is feeling depressed and the negative consequences of this neglect. Patient supported and encouraged other group members as needed. Patient additionally identified need for support system post d/c to help her maintain her wellness, patient related use of support system to being able to maintain dimensions of wellness, such as social and emotion, which would encourage her to invest in other dimensions.   Laureen Ochs Levelle Edelen, LRT/CTRS  K[place]ner Warrior L 05/08/2013 8:56 AM

## 2013-05-08 NOTE — Progress Notes (Signed)
[facility] Adult Case Management Discharge Plan :  Will you be returning to the same living situation after discharge: Yes,  Patient is returning to her home. At discharge, do you have transportation home?:Yes,  Patient to arrange transportation home. Do you have the ability to pay for your medications:Yes,  Patient can afford medications.  Release of information consent forms completed and in the chart;  Patient's signature needed at discharge.  Patient to Follow up at: Follow-up Information   Follow up with [facility] On 05/09/2013. (Wednesday, May 09, 2013 at 8:45 for psych IOP. Make sure to bring Medicare card to your appt. )    Contact information:   [address] [place], [place] [address] Phone: [phone]      Patient denies SI/HI:   Patient no longer endorsing SI/HI or other thoughts of self harm.     Safety Planning and Suicide Prevention discussed:  .Reviewed with all patients during discharge planning group  [name], [name] 05/08/2013, 2:48 PM

## 2013-05-08 NOTE — Progress Notes (Signed)
D:  Patient's self inventory sheet, patient has fair sleep, reduced appetite, sometimes feels nauseated, low energy level, improving appetite.  Rated depression 3-4, hopeless 7, anxiety 4.  Denied withdrawals.  Denied SI.  Has experienced stomach ache in past 24 hours.  Reduce pain goal today.  Worst pain, mental 3, physical 7.  After discharge, plans to stay on meds; attend IOP 5 days/week for 2 weeks.  Get out of apartment.  Clean up the apartment to lessen stress.  Has experienced headaches, nausea, green stuff from nose and dripping down back of throat, sinus infection? A:  Medications administered per MD orders.  Emotional support and encouragement given patient. R:  Denied SI and HI.  Denied A/V hallucinations.  Denied pain.  Will continue to monitor patient for safety with  15 minute checks.  Safety maintained.

## 2013-05-08 NOTE — Progress Notes (Signed)
The focus of this group is to educate the patient on the purpose and policies of crisis stabilization and provide a format to answer questions about their admission.  The group details unit policies and expectations of patients while admitted.  Patient did not attend 0900 nurse education orientation group this morning.  Patient was resting in bed.

## 2013-05-08 NOTE — [facility] Suicide Risk Assessment (Signed)
Suicide Risk Assessment  Discharge Assessment     Demographic Factors:  Age 68 or older, Caucasian and Living alone  Total Time spent with patient: 45 minutes  Psychiatric Specialty Exam:     Blood pressure 130/82, pulse 80, temperature 98.1 F (36.7 C), temperature source Oral, resp. rate 17, height 5\' 3"  (1.6 m), weight 100.245 kg (221 lb).Body mass index is 39.16 kg/(m^2).  General Appearance: Fairly Groomed  Engineer, water::  Fair  Speech:  Clear and Coherent  Volume:  Decreased  Mood:  Anxious and worried  Affect:  anxious, worried  Thought Process:  Coherent and Goal Directed  Orientation:  Full (Time, Place, and Person)  Thought Content:  plans of how to move forward, be more active, come to the IOP   Suicidal Thoughts:  No  Homicidal Thoughts:  No  Memory:  Immediate;   Fair Recent;   Fair Remote;   Fair  Judgement:  Fair  Insight:  Present  Psychomotor Activity:  Normal  Concentration:  Fair  Recall:  [employer] [place]  Language: Fair  Akathisia:  No  Handed:    AIMS (if indicated):     Assets:  Desire for Improvement Housing  Sleep:  Number of Hours: 4.25    Musculoskeletal: Strength & Muscle Tone: within normal limits Gait & Station: normal Patient leans: N/A   Mental Status Per Nursing Assessment::   On Admission:     Current Mental Status by Physician: In full contact with reality. There are no active suicidal ideas, plans or intent. More hopeful than when she came in   Loss Factors: Decline in physical health and Financial problems/change in socioeconomic status  Historical Factors: NA  Risk Reduction Factors:   Religious beliefs about death and Positive social support  Continued Clinical Symptoms:  Depression:   Insomnia Severe  Cognitive Features That Contribute To Risk:  Closed-mindedness Polarized thinking Thought constriction (tunnel vision)    Suicide Risk:  Minimal: No identifiable suicidal ideation.  Patients  presenting with no risk factors but with morbid ruminations; may be classified as minimal risk based on the severity of the depressive symptoms  Discharge Diagnoses:   AXIS I:  Major Depression, recurrent severe, GAD, ADHD AXIS II:  No diagnosis AXIS III:   Past Medical History  Diagnosis Date  . Abscess of Bartholin's gland   . History of pleural effusion   . History of attention deficit disorder   . History of endometriosis   . RLS (restless legs syndrome)   . Anxiety   . Obesity   . GERD (gastroesophageal reflux disease)   . Renal mass 02/15/2012  . Dysrhythmia     h/o ventricular tachycardia- ablation resolved it  . Hypothyroidism   . Depression     Bipolar disorder  . OSA (obstructive sleep apnea)     uses oral appliance instead of CPAP  . Vertigo   . Chronic kidney disease     kidney cancer- pt states she has elected to not have it treated.  Marland Kitchen PONV (postoperative nausea and vomiting)   . Difficult intubation     told by [facility] that she was hard to intubate 15b yrs ago in [place]- surgery since then no problems  . Adverse effect of general anesthetic     felt paralyzed while receiving anesthesia  . Bipolar 1 disorder   . PTSD (post-traumatic stress disorder)   . rt renal ca dx'd 12/2009    no treatment/ no surg  . Hypertension   .  Asthma     as a child   AXIS IV:  other psychosocial or environmental problems AXIS V:  61-70 mild symptoms  Plan Of Care/Follow-up recommendations:  Activity:  as tolerated Diet:  regular Follow up [facility] IOP and outpatient department Is patient on multiple antipsychotic therapies at discharge:  No   Has Patient had three or more failed trials of antipsychotic monotherapy by history:  No  Recommended Plan for Multiple Antipsychotic Therapies: NA    Nesbit Michon A 05/08/2013, 3:58 PM

## 2013-05-08 NOTE — Progress Notes (Signed)
Discharge Note:  Patient discharged home with family member.   Patient denied SI and Hi.  Denied A/V hallucinations.  Denied pain.  Suicide prevention information given and discussed with patient who stated she understood and had no questions.  Patient stated she received all her belongings, clothing, shoes, toiletries, miscellaneous items, prescriptions, medications.  Patient stated she appreciated all assistance from [facility] staff.  Patient has been cooperative and pleasant.

## 2013-05-08 NOTE — Discharge Summary (Signed)
Physician Discharge Summary Note  Patient:  [name] is an 68 y.o., female MRN:  [id number] DOB:  1945/12/16 Patient phone:  [phone] (home)  Patient address:   [place] [address],  Total Time spent with patient: Greater than 30 minutes  Date of Admission:  04/26/2013  Date of Discharge: 05/08/13  Reason for Admission:  Mood stabilization  Discharge Diagnoses: Principal Problem:   Major depressive disorder, recurrent episode, severe, without mention of psychotic behavior Active Problems:   Recurrent major depression-severe   Psychiatric Specialty Exam: Physical Exam  Constitutional: She is oriented to person, place, and time. She appears well-developed.  HENT:  Head: Normocephalic.  Eyes: Pupils are equal, round, and reactive to light.  Neck: Normal range of motion.  Cardiovascular: Normal rate.   Respiratory: Effort normal.  GI: Soft.  Genitourinary:  Denies any issues in this area  Musculoskeletal: Normal range of motion.  Neurological: She is alert and oriented to person, place, and time.  Skin: Skin is warm and dry.  Psychiatric: Her speech is normal and behavior is normal. Judgment and thought content normal. Anxious: Stable. Her affect is not angry, not blunt, not labile and not inappropriate. Cognition and memory are normal. Depressed: Stable.    Review of Systems  Constitutional: Negative.   HENT: Negative.   Eyes: Negative.   Respiratory: Negative.   Cardiovascular: Negative.   Gastrointestinal: Negative.   Genitourinary: Negative.   Musculoskeletal: Negative.   Skin: Negative.   Neurological: Negative.   Endo/Heme/Allergies: Negative.   Psychiatric/Behavioral: Positive for depression (Stabilized with medication prior to discharge). Negative for suicidal ideas, hallucinations, memory loss and substance abuse. The patient is nervous/anxious (Stabiliozed with medication prior to discharge) and has insomnia  (Stabilized with medication prior to discharge).     Blood pressure 130/82, pulse 80, temperature 98.1 F (36.7 C), temperature source Oral, resp. rate 17, height 5\' 3"  (1.6 m), weight 100.245 kg (221 lb).Body mass index is 39.16 kg/(m^2).  General Appearance: Casual and Fairly Groomed  Engineer, water::  Good  Speech:  Clear and Coherent  Volume:  Normal  Mood:  Stable  Affect:  Appropriate and Congruent  Thought Process:  Coherent, Intact and Logical  Orientation:  Full (Time, Place, and Person)  Thought Content:  Denies any psychotic symptoms  Suicidal Thoughts:  No  Homicidal Thoughts:  No  Memory:  Immediate;   Good Recent;   Good Remote;   Good  Judgement:  Good  Insight:  Present  Psychomotor Activity:  Normal  Concentration:  Good  Recall:  Good  Fund of Knowledge:Good  Language: Good  Akathisia:  No  Handed:  Right  AIMS (if indicated):     Assets:  Desire for Improvement  Sleep:  Number of Hours: 4.25    Past Psychiatric History: Diagnosis:  Hospitalizations:  Outpatient Care:  Substance Abuse Care:  Self-Mutilation:  Suicidal Attempts:  Violent Behaviors:   Musculoskeletal: Strength & Muscle Tone: within normal limits Gait & Station: normal Patient leans: N/A  DSM5: Schizophrenia Disorders:  NA Obsessive-Compulsive Disorders:  NA Trauma-Stressor Disorders:  NA Substance/Addictive Disorders:  NA Depressive Disorders:  Major depressive disorder, recurrent episode, severe, without mention of psychotic behavior   Axis Diagnosis:   AXIS I:  Major depressive disorder, recurrent episode, severe, without mention of psychotic behavior AXIS II:  Deferred AXIS III:   Past Medical History  Diagnosis Date  . Abscess of Bartholin's gland   . History of pleural effusion   .  History of attention deficit disorder   . History of endometriosis   . RLS (restless legs syndrome)   . Anxiety   . Obesity   . GERD (gastroesophageal reflux disease)   . Renal mass  02/15/2012  . Dysrhythmia     h/o ventricular tachycardia- ablation resolved it  . Hypothyroidism   . Depression     Bipolar disorder  . OSA (obstructive sleep apnea)     uses oral appliance instead of CPAP  . Vertigo   . Chronic kidney disease     kidney cancer- pt states she has elected to not have it treated.  Marland Kitchen PONV (postoperative nausea and vomiting)   . Difficult intubation     told by [facility] that she was hard to intubate 15b yrs ago in [place]- surgery since then no problems  . Adverse effect of general anesthetic     felt paralyzed while receiving anesthesia  . Bipolar 1 disorder   . PTSD (post-traumatic stress disorder)   . rt renal ca dx'd 12/2009    no treatment/ no surg  . Hypertension   . Asthma     as a child   AXIS IV:  economic problems, housing problems, occupational problems and Mental illness, chronic AXIS V:  63  Level of Care:  OP  Hospital Course: "I am going to fall off a cliff. States that Christmas might have been a trigger. She had to leave her apartment as they had raised the rent, could not afford it. Couldd not find a place, her car broke could not fix it, had no money. States that since the divorce she has a totally different life style. States it go so bad she was afraid for herself. For the first time a friend committed suicide and then another young guy killed himself and states she felt "this tastes good." Admit she is tired of fighting. Sates she has black outs, cant follow instructions, lost her phome, losing things disoriented as far as time, walked off her car and left the door open.   Ivin Booty received medication managment to re-stabilize her depressed mood. She was ordered and received Wellbutrin XL 300 mg daily for depression, Prozac 10 mg daily for depression/anxiety, Doxepin 25 mg Q bedtime for sleep, Lamictal 150 mg twice daily for mood stabilization and Ativan 1 mg three times daily for severe anxiety. She was enrolled and participated in the group  counseling sessions, where she learned coping skills to access to cope better after discharge. Claudine was also resumed on all her pertinent home medications for her other medical conditions. She tolerated her treatments without any significant adverse effects and reactions.   Kari's mood has stabilized. This is evidenced by her daily reports of improved mood, presentation of good affects/eye contact. She is currently being discharged to follow-up care on an outpatient basis. She is provided with all the pertinent information needed to make her outpatient appointments without difficulty. Rayanna is hereby provided with 4 days worth supply samples of her [facility] discharge medications.   Upon discharge, patient adamantly denies any suicidal, homicidal ideations, auditory, visual hallucinations, delusional thoughts and or paranoia. She left [facility] with all personal belongings in no apparent distress. Transportation per patient arrangement.  Consults:  psychiatry  Significant Diagnostic Studies:  labs: Reviewed current lab reports on file, no changes  Discharge Vitals:   Blood pressure 130/82, pulse 80, temperature 98.1 F (36.7 C), temperature source Oral, resp. rate 17, height 5\' 3"  (1.6 m), weight 100.245 kg (221  lb). Body mass index is 39.16 kg/(m^2). Lab Results:   No results found for this or any previous visit (from the past 72 hour(s)).  Physical Findings: AIMS: Facial and Oral Movements Muscles of Facial Expression: None, normal Lips and Perioral Area: None, normal Jaw: None, normal Tongue: None, normal,Extremity Movements Upper (arms, wrists, hands, fingers): None, normal Lower (legs, knees, ankles, toes): None, normal, Trunk Movements Neck, shoulders, hips: None, normal, Overall Severity Severity of abnormal movements (highest score from questions above): None, normal Incapacitation due to abnormal movements: None, normal Patient's awareness of abnormal movements (rate only patient's  report): No Awareness, Dental Status Current problems with teeth and/or dentures?: No Does patient usually wear dentures?: No  CIWA:  CIWA-Ar Total: 2 COWS:  COWS Total Score: 2  Psychiatric Specialty Exam: See Psychiatric Specialty Exam and Suicide Risk Assessment completed by Attending Physician prior to discharge.  Discharge destination:  Home  Is patient on multiple antipsychotic therapies at discharge:  No   Has Patient had three or more failed trials of antipsychotic monotherapy by history:  No  Recommended Plan for Multiple Antipsychotic Therapies: NA   Future Appointments Provider Department Dept Phone   05/29/2013 9:15 AM Lady Saucier, [place] [facility] [place] [phone]   06/06/2013 9:15 AM Sueanne Margarita, MD [facility] [phone]   07/24/2013 9:00 AM [facility] Only [facility] [phone]   07/24/2013 9:30 AM Wyatt Portela, MD [facility] [phone]       Medication List    STOP taking these medications       DULoxetine 30 MG capsule  Commonly known as:  CYMBALTA     fluticasone 50 MCG/ACT nasal spray  Commonly known as:  FLONASE     magnesium gluconate 500 MG tablet  Commonly known as:  MAGONATE     traMADol 50 MG tablet  Commonly known as:  ULTRAM     traZODone 50 MG tablet  Commonly known as:  DESYREL     Vitamin D 2000 UNITS Caps      TAKE these medications     Indication   aspirin 81 MG EC tablet  Take 1 tablet (81 mg total) by mouth daily. For heart health   Indication:  Heart health     buPROPion 300 MG 24 hr tablet  Commonly known as:  WELLBUTRIN XL  Take 1 tablet (300 mg total) by mouth daily. For depression   Indication:  Major Depressive Disorder     doxepin 25 MG capsule  Commonly known as:  SINEQUAN  Take 1 capsule (25 mg total) by mouth at bedtime as needed and may repeat dose one time if needed (insomnia).    Indication:  Insomnia     DSS 100 MG Caps  Take 100 mg by mouth 2 (two) times daily. (This medicine may be purchased from over the counter at your local pharmacy): For constipation   Indication:  Constipation     FLUoxetine 10 MG capsule  Commonly known as:  PROZAC  Take 1 capsule (10 mg total) by mouth daily. For depression   Indication:  Major Depressive Disorder     lamoTRIgine 150 MG tablet  Commonly known as:  LAMICTAL  Take 1 tablet (150 mg total) by mouth 2 (two) times daily. Mood stabilization   Indication:  Mood stabilization     levothyroxine 88 MCG tablet  Commonly known as:  SYNTHROID, LEVOTHROID  Take  1 tablet (88 mcg total) by mouth daily before breakfast. For low thyroid function   Indication:  Underactive Thyroid     LORazepam 1 MG tablet  Commonly known as:  ATIVAN  Take 1 tablet (1 mg total) by mouth 3 (three) times daily as needed for anxiety.   Indication:  Feeling Anxious     nebivolol 2.5 MG tablet  Commonly known as:  BYSTOLIC  Take 1 tablet (2.5 mg total) by mouth daily. For high blood pressure   Indication:  High Blood Pressure     pantoprazole 40 MG tablet  Commonly known as:  PROTONIX  Take 1 tablet (40 mg total) by mouth daily. For acid reflux   Indication:  Gastroesophageal Reflux Disease     rOPINIRole 0.5 MG tablet  Commonly known as:  REQUIP  Take 1-2 tablets (0.5-1 mg total) by mouth 2 (two) times daily. Take 0.5mg  at dinner and 2 at bedtime: For restless leg   Indication:  Restless Leg Syndrome     valsartan-hydrochlorothiazide 320-25 MG per tablet  Commonly known as:  DIOVAN-HCT  Take 1 tablet by mouth daily. High blood pressure control   Indication:  High Blood Pressure           Follow-up Information   Follow up with [facility] On 05/09/2013. (Wednesday, May 09, 2013 at 8:45 for psych IOP. Make sure to bring Medicare card to your appt. )    Contact information:   [address] [place],  [address] Phone:  [phone]     Follow-up recommendations: Activity:  As tolerated Diet: As recommended by your primary care doctor. Keep all scheduled follow-up appointments as recommended.   Continue to work on the life style changes that can help better manage your mood/anxiety disorder Comments: Take all your medications as prescribed by your mental healthcare provider. Report any adverse effects and or reactions from your medicines to your outpatient provider promptly. Patient is instructed and cautioned to not engage in alcohol and or illegal drug use while on prescription medicines. In the event of worsening symptoms, patient is instructed to call the crisis hotline, 911 and or go to the nearest ED for appropriate evaluation and treatment of symptoms. Follow-up with your primary care provider for your other medical issues, concerns and or health care needs.   Total Discharge Time:  Greater than 30 minutes.  Signed: Encarnacion Slates, PMHNP-BC Agree with assessment and plan Geralyn Flash A. Marelyn Rouser,M.D. 05/08/2013, 1:08 PM

## 2013-05-08 NOTE — Progress Notes (Signed)
Patient ID: [name], female   DOB: Aug 09, 1945, 68 y.o.   MRN: [id number] D: Pt. In bed, eyes closed, respirations even.. A: Writer observed for s/s of distress. R: No distress noted, respirations unlabored.

## 2013-05-11 NOTE — Progress Notes (Signed)
Patient Discharge Instructions:  Next Level Care Provider Has Access to the EMR, 05/11/13 Records provided to [facility] via [facility]/Epic access.  Patsey Berthold, 05/11/2013, 4:07 PM

## 2013-05-14 ENCOUNTER — Ambulatory Visit: Payer: Self-pay | Admitting: Pulmonary Disease

## 2013-05-21 ENCOUNTER — Encounter: Payer: Self-pay | Admitting: Oncology

## 2013-05-25 ENCOUNTER — Encounter (HOSPITAL_COMMUNITY): Payer: Self-pay

## 2013-05-25 ENCOUNTER — Other Ambulatory Visit (HOSPITAL_COMMUNITY): Payer: Medicare Other | Attending: Psychiatry | Admitting: Psychiatry

## 2013-05-25 DIAGNOSIS — F988 Other specified behavioral and emotional disorders with onset usually occurring in childhood and adolescence: Secondary | ICD-10-CM | POA: Insufficient documentation

## 2013-05-25 DIAGNOSIS — I129 Hypertensive chronic kidney disease with stage 1 through stage 4 chronic kidney disease, or unspecified chronic kidney disease: Secondary | ICD-10-CM | POA: Insufficient documentation

## 2013-05-25 DIAGNOSIS — G4733 Obstructive sleep apnea (adult) (pediatric): Secondary | ICD-10-CM | POA: Diagnosis not present

## 2013-05-25 DIAGNOSIS — G2581 Restless legs syndrome: Secondary | ICD-10-CM | POA: Insufficient documentation

## 2013-05-25 DIAGNOSIS — F411 Generalized anxiety disorder: Secondary | ICD-10-CM | POA: Insufficient documentation

## 2013-05-25 DIAGNOSIS — F4310 Post-traumatic stress disorder, unspecified: Secondary | ICD-10-CM | POA: Diagnosis not present

## 2013-05-25 DIAGNOSIS — E039 Hypothyroidism, unspecified: Secondary | ICD-10-CM | POA: Diagnosis not present

## 2013-05-25 DIAGNOSIS — N189 Chronic kidney disease, unspecified: Secondary | ICD-10-CM | POA: Diagnosis not present

## 2013-05-25 DIAGNOSIS — IMO0002 Reserved for concepts with insufficient information to code with codable children: Secondary | ICD-10-CM | POA: Diagnosis not present

## 2013-05-25 DIAGNOSIS — F332 Major depressive disorder, recurrent severe without psychotic features: Secondary | ICD-10-CM | POA: Insufficient documentation

## 2013-05-25 DIAGNOSIS — F331 Major depressive disorder, recurrent, moderate: Secondary | ICD-10-CM

## 2013-05-25 DIAGNOSIS — G43909 Migraine, unspecified, not intractable, without status migrainosus: Secondary | ICD-10-CM | POA: Insufficient documentation

## 2013-05-25 DIAGNOSIS — K219 Gastro-esophageal reflux disease without esophagitis: Secondary | ICD-10-CM | POA: Insufficient documentation
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
## 2013-05-25 NOTE — Progress Notes (Signed)
    Daily Group Progress Note  Program: IOP  Group Time: 9:00-10:30  Participation Level: Active  Behavioral Response: Appropriate and Sharing  Type of Therapy:  Group Therapy  Summary of Progress: Pt. Met with psychiatrist and case manager.     Group Time: 10:30-12:00  Participation Level:  Active  Behavioral Response: Appropriate and Sharing  Type of Therapy: Psycho-education Group  Summary of Progress: Pt. Participated in values clarification exercise.  Nancie Neas, COUNS

## 2013-05-28 ENCOUNTER — Other Ambulatory Visit (HOSPITAL_COMMUNITY): Payer: Medicare Other

## 2013-05-28 ENCOUNTER — Telehealth (HOSPITAL_COMMUNITY): Payer: Self-pay | Admitting: Psychiatry

## 2013-05-29 ENCOUNTER — Ambulatory Visit: Payer: Self-pay | Admitting: Dietician

## 2013-05-30 ENCOUNTER — Encounter (HOSPITAL_COMMUNITY): Payer: Self-pay | Admitting: Psychiatry

## 2013-05-30 ENCOUNTER — Other Ambulatory Visit (HOSPITAL_COMMUNITY): Payer: Medicare Other | Admitting: Psychiatry

## 2013-05-30 DIAGNOSIS — F332 Major depressive disorder, recurrent severe without psychotic features: Secondary | ICD-10-CM | POA: Diagnosis not present
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
## 2013-05-31 ENCOUNTER — Encounter: Payer: Self-pay | Admitting: General Surgery

## 2013-05-31 ENCOUNTER — Other Ambulatory Visit: Payer: Self-pay | Admitting: Family Medicine

## 2013-05-31 ENCOUNTER — Other Ambulatory Visit (HOSPITAL_COMMUNITY): Payer: Medicare Other | Admitting: Psychiatry

## 2013-05-31 ENCOUNTER — Encounter (HOSPITAL_COMMUNITY): Payer: Self-pay | Admitting: Psychiatry

## 2013-05-31 DIAGNOSIS — I519 Heart disease, unspecified: Secondary | ICD-10-CM

## 2013-05-31 DIAGNOSIS — F332 Major depressive disorder, recurrent severe without psychotic features: Secondary | ICD-10-CM | POA: Diagnosis not present

## 2013-05-31 DIAGNOSIS — I493 Ventricular premature depolarization: Secondary | ICD-10-CM

## 2013-05-31 DIAGNOSIS — I10 Essential (primary) hypertension: Secondary | ICD-10-CM

## 2013-05-31 DIAGNOSIS — I4719 Other supraventricular tachycardia: Secondary | ICD-10-CM | POA: Insufficient documentation

## 2013-05-31 DIAGNOSIS — E669 Obesity, unspecified: Secondary | ICD-10-CM | POA: Insufficient documentation

## 2013-05-31 DIAGNOSIS — R1011 Right upper quadrant pain: Secondary | ICD-10-CM

## 2013-05-31 DIAGNOSIS — I471 Supraventricular tachycardia: Secondary | ICD-10-CM | POA: Insufficient documentation

## 2013-05-31 HISTORY — DX: Essential (primary) hypertension: I10

## 2013-05-31 HISTORY — DX: Heart disease, unspecified: I51.9

## 2013-05-31 NOTE — Progress Notes (Signed)
    Daily Group Progress Note  Program: IOP  Group Time: 9:00-10:30  Participation Level: Active  Behavioral Response: Appropriate  Type of Therapy:  Group Therapy  Summary of Progress: Pt. Participated in heartmath and guided visualization. Pt. Joined in discussion about the pain of loneliness, allowing herself to be vulnerable in relationships, facing fears of being hurt in relationships.     Group Time: 10:30-12:00  Participation Level:  Active  Behavioral Response: Appropriate  Type of Therapy: Psycho-education Group  Summary of Progress: Pt. Participated in discussion about developing a routine and countering negative self-talk.  Nancie Neas, COUNS

## 2013-06-01 ENCOUNTER — Other Ambulatory Visit (HOSPITAL_COMMUNITY): Payer: Medicare Other | Admitting: Psychiatry

## 2013-06-04 ENCOUNTER — Encounter (HOSPITAL_COMMUNITY): Payer: Self-pay | Admitting: Psychiatry

## 2013-06-04 ENCOUNTER — Other Ambulatory Visit (HOSPITAL_COMMUNITY): Payer: Medicare Other | Admitting: Psychiatry

## 2013-06-04 DIAGNOSIS — F332 Major depressive disorder, recurrent severe without psychotic features: Secondary | ICD-10-CM | POA: Diagnosis not present

## 2013-06-04 MED ORDER — LAMOTRIGINE 150 MG PO TABS: 150.0000 mg | ORAL_TABLET | Freq: Two times a day (BID) | ORAL | Status: DC

## 2013-06-04 MED ORDER — LORAZEPAM 1 MG PO TABS: 1.0000 mg | ORAL_TABLET | Freq: Three times a day (TID) | ORAL | Status: DC | PRN

## 2013-06-04 MED ORDER — FLUOXETINE HCL 10 MG PO CAPS: 10.0000 mg | ORAL_CAPSULE | Freq: Every day | ORAL | Status: DC

## 2013-06-04 MED ORDER — BUPROPION HCL ER (XL) 300 MG PO TB24: 300.0000 mg | ORAL_TABLET | Freq: Every day | ORAL | Status: DC

## 2013-06-04 MED ORDER — BUPROPION HCL ER (XL) 300 MG PO TB24
300.0000 mg | ORAL_TABLET | Freq: Every day | ORAL | Status: DC
Start: 2013-06-04 — End: 2013-06-04
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
## 2013-06-04 NOTE — Progress Notes (Signed)
    Daily Group Progress Note  Program: IOP  Group Time: 9:00-10:30  Participation Level: Active  Behavioral Response: Appropriate  Type of Therapy:  Group Therapy  Summary of Progress: Pt. Participated in heartmath and morning meditation. Pt. Met with physician for second half of group.     Group Time: 10:30-12:00  Participation Level:  Active  Behavioral Response: Appropriate  Type of Therapy: Psycho-education Group  Summary of Progress: Pt. Participated in discussion about how to develop a "wise mind" approach to decision making that integrates the intellectual and emotional aspects of the decision making process.  Nancie Neas, COUNS

## 2013-06-04 NOTE — Addendum Note (Signed)
Addended by: Nena Polio T on: 06/04/2013 11:00 AM   Modules accepted: Orders

## 2013-06-04 NOTE — Patient Instructions (Signed)
Take all your medications as prescribed by your mental healthcare provider. Report any adverse effects and or reactions from your medicines to your outpatient provider promptly. Patient is instructed and cautioned to not engage in alcohol and or illegal drug use while on prescription medicines. In the event of worsening symptoms, patient is instructed to call the crisis hotline, 911 and or go to the nearest ED for appropriate evaluation and treatment of symptoms. Follow-up with your primary care provider for your other medical issues, concerns and or health care needs.

## 2013-06-05 ENCOUNTER — Other Ambulatory Visit (HOSPITAL_COMMUNITY): Payer: Medicare Other | Admitting: Psychiatry

## 2013-06-06 ENCOUNTER — Other Ambulatory Visit (HOSPITAL_COMMUNITY): Payer: Medicare Other | Admitting: Psychiatry

## 2013-06-06 ENCOUNTER — Ambulatory Visit: Payer: Self-pay | Admitting: Cardiology

## 2013-06-06 DIAGNOSIS — F332 Major depressive disorder, recurrent severe without psychotic features: Secondary | ICD-10-CM

## 2013-06-07 ENCOUNTER — Other Ambulatory Visit (HOSPITAL_COMMUNITY): Payer: Medicare Other | Admitting: Psychiatry

## 2013-06-07 DIAGNOSIS — F332 Major depressive disorder, recurrent severe without psychotic features: Secondary | ICD-10-CM | POA: Diagnosis not present

## 2013-06-08 ENCOUNTER — Other Ambulatory Visit (HOSPITAL_COMMUNITY): Payer: Medicare Other | Admitting: Psychiatry

## 2013-06-08 ENCOUNTER — Encounter (HOSPITAL_COMMUNITY): Payer: Self-pay | Admitting: Psychiatry

## 2013-06-08 DIAGNOSIS — F332 Major depressive disorder, recurrent severe without psychotic features: Secondary | ICD-10-CM | POA: Diagnosis not present

## 2013-06-08 NOTE — Progress Notes (Signed)
    Daily Group Progress Note  Program: IOP  Group Time: 9:00-10:30  Participation Level: Active  Behavioral Response: Appropriate  Type of Therapy:  Group Therapy  Summary of Progress: Pt. Participated in heartmath and morning meditation. Pt. Participated in discussion about understanding the purpose of emotions and how we might learn from distressing emotions.     Group Time: 10:30-12:00  Participation Level:  Active  Behavioral Response: Appropriate  Type of Therapy: Psycho-education Group  Summary of Progress: Pt. Participated in discussion about developing healthy relationship boundaries.  [facility]

## 2013-06-08 NOTE — Progress Notes (Signed)
    Daily Group Progress Note  Program: IOP  Group Time: 9:00-10:30  Participation Level: Active  Behavioral Response: Appropriate  Type of Therapy:  Group Therapy  Summary of Progress: Pt. Participated in discussion about developing healthy relationship boundaries and identifying unhealthy relationship behaviors.     Group Time: 10:30-12:00  Participation Level:  Active  Behavioral Response: Appropriate  Type of Therapy: Psycho-education Group  Summary of Progress: Pt. Participated in developing positive intention for weekend and discussion of how to cultivate joy and pleasure in our lives.  Nancie Neas, COUNS

## 2013-06-08 NOTE — Progress Notes (Signed)
    Daily Group Progress Note  Program: IOP  Group Time: 9:00-10:30  Participation Level: Active  Behavioral Response: Appropriate  Type of Therapy:  Group Therapy  Summary of Progress: Pt. Participated in heartmath and guided visualization. Pt. Reported challenge of finding friend group and developing acceptance of herself.     Group Time: 10:30-12:00  Participation Level:  Active  Behavioral Response: Appropriate  Type of Therapy: Psycho-education Group  Summary of Progress: Pt. Participated in discussion about developing healthy relationship boundaries.  Nancie Neas, COUNS

## 2013-06-11 ENCOUNTER — Other Ambulatory Visit (HOSPITAL_COMMUNITY): Payer: Medicare Other

## 2013-06-12 ENCOUNTER — Encounter (HOSPITAL_COMMUNITY): Payer: Self-pay | Admitting: Psychiatry

## 2013-06-12 ENCOUNTER — Other Ambulatory Visit (HOSPITAL_COMMUNITY): Payer: Medicare Other | Admitting: Psychiatry

## 2013-06-12 DIAGNOSIS — F332 Major depressive disorder, recurrent severe without psychotic features: Secondary | ICD-10-CM | POA: Diagnosis not present

## 2013-06-12 NOTE — Progress Notes (Signed)
    Daily Group Progress Note  Program: IOP  Group Time: 9:00-10:30  Participation Level: Active  Behavioral Response: Appropriate  Type of Therapy:  Group Therapy  Summary of Progress: Pt. Shared sadness regarding multiple stressors including financial strain, estrangement from close friends, need to reconnect with individual therapist and psychiatrist, and pressure to move out of her apartment due to the cost. Pt. Presented as tearful and reported feeling hopeless.     Group Time: 10:30-12:00  Participation Level:  Active  Behavioral Response: Appropriate  Type of Therapy: Psycho-education Group  Summary of Progress: Pt. Participated in discussion about the process of reconnecting with self based on themes from [employer], Pray, Love  [place], [place], [place]

## 2013-06-13 ENCOUNTER — Other Ambulatory Visit (HOSPITAL_COMMUNITY): Payer: Medicare Other

## 2013-06-14 ENCOUNTER — Encounter (HOSPITAL_COMMUNITY): Payer: Self-pay | Admitting: Psychiatry

## 2013-06-14 ENCOUNTER — Other Ambulatory Visit (HOSPITAL_COMMUNITY): Payer: Medicare Other | Admitting: Psychiatry

## 2013-06-14 DIAGNOSIS — F332 Major depressive disorder, recurrent severe without psychotic features: Secondary | ICD-10-CM | POA: Diagnosis not present

## 2013-06-14 MED ORDER — BUPROPION HCL ER (XL) 300 MG PO TB24: 300.0000 mg | ORAL_TABLET | Freq: Every day | ORAL | Status: DC

## 2013-06-14 MED ORDER — LORAZEPAM 1 MG PO TABS: 1.0000 mg | ORAL_TABLET | Freq: Three times a day (TID) | ORAL | Status: DC | PRN

## 2013-06-14 MED ORDER — LAMOTRIGINE 150 MG PO TABS: 150.0000 mg | ORAL_TABLET | Freq: Two times a day (BID) | ORAL | Status: DC

## 2013-06-14 NOTE — Progress Notes (Signed)
    Daily Group Progress Note  Program: IOP  Group Time: 9:00-10:30  Participation Level: Active  Behavioral Response: Appropriate  Type of Therapy:  Group Therapy  Summary of Progress: Pt. Shared that she was feeling some mild anxiety related to discharge from the program today. Pt. Shared that she was able to reconnect with an old friend that she had been distanced from. Pt. Shared encouragement regarding possibly receiving [place] therapy.     Group Time: 10:30-12:00  Participation Level:  Active  Behavioral Response: Appropriate  Type of Therapy: Psycho-education Group  Summary of Progress: Pt. Participated in discussion about [place] that was facilitated by Josh.  Nancie Neas, COUNS

## 2013-06-14 NOTE — Patient Instructions (Signed)
Patient completed [facility]-IOP today.  Will follow up with Lillie Fragmin, LPC on 06-18-13 @ 11 a.m and Delrae Alfred, NP on 07-17-13 @ 2 pm.  Encouraged support groups.
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
## 2013-06-14 NOTE — Progress Notes (Signed)
Patient ID: [name], female   DOB: Sep 22, 1945, 68 y.o.   MRN: [id number] D: This is a 68 yr old, divorced X 2, caucasian, female, who was transitioned from the inpt unit at [facility]. Pt was there from 04-26-13 until 05-08-13 due to worsening anxiety and depressive symptoms. States that her symptoms worsened in December 2014. Symptoms include feeling overwhelmed, no motivation, decreased hygiene, anxiety, sadness, tearfulness, poor sleep, indecisiveness, and irritability. Denies SI/HI or A/V hallucinations. No hx of SI or self mutilation. Has been inpt [facility]) four times. Hx of seeing Dr. Clovis Pu and Rosary Lively, [facility] for years, but stopped going due to insurance purposes. Then went on to see providers at [facility]. Pt states she hasn't seen anyone since November 2014. When asked what was done for her while inpatient, pt stated that her meds were changed, and attended groups. "I feel that I left too soon. My medications hadn't kicked in, but after a week at home they finally kicked in." Family Hx: Mother was depressed, Father struggled with ETOH and Bipolar D/O and Paternal GM had OCD.  Triggers/Stressors: 1) Financial Strain. Reports that she will have to move from her apartment due to inability to pay the rent. States that a friend agreed to put $100 towards her rent for the rest of this year. 2) Limited support system. 3) In October 2011, pt was dx with cancer in her kidney. Pt states she is declining treatment. "I'm so hopeless, so it's OK to go."  Pt has other medical conditions (RLS, HTN, Sleep Apnea, GERD, Hypothyroidism, Vertigo). Pt completed MH-IOP today.  States that she isn't feeling any better.  Remains depressed, no motivation, with crying spells.  States that her appetite, sleep, and concentration is improving.  "I learned some coping skills and I will miss the structure."  Pt reports that she would like to continue working on improving her self esteem.  A:  D/C today.  F/U with Lillie Fragmin, [facility] on 06-18-13 @ 11 a.m and Delrae Alfred, NP on 07-17-13 @ 2pm.  Encouraged support groups and for patient to attend [facility].  R:  Pt receptive.

## 2013-06-15 ENCOUNTER — Other Ambulatory Visit (HOSPITAL_COMMUNITY): Payer: Medicare Other

## 2013-06-18 ENCOUNTER — Other Ambulatory Visit (HOSPITAL_COMMUNITY): Payer: Medicare Other

## 2013-06-19 ENCOUNTER — Other Ambulatory Visit (HOSPITAL_COMMUNITY): Payer: Medicare Other

## 2013-06-20 ENCOUNTER — Encounter: Payer: Self-pay | Admitting: Pulmonary Disease

## 2013-06-20 ENCOUNTER — Other Ambulatory Visit (HOSPITAL_COMMUNITY): Payer: Medicare Other

## 2013-06-20 ENCOUNTER — Ambulatory Visit (INDEPENDENT_AMBULATORY_CARE_PROVIDER_SITE_OTHER): Payer: Medicare Other | Admitting: Pulmonary Disease

## 2013-06-20 VITALS — BP 108/70 | HR 69 | Temp 98.0°F | Ht 64.5 in | Wt 229.4 lb

## 2013-06-20 DIAGNOSIS — G4733 Obstructive sleep apnea (adult) (pediatric): Secondary | ICD-10-CM

## 2013-06-20 DIAGNOSIS — G2581 Restless legs syndrome: Secondary | ICD-10-CM

## 2013-06-20 NOTE — Progress Notes (Signed)
   Subjective:    Patient ID: [name], female    DOB: 06-30-1945, 68 y.o.   MRN: [id number]  HPI The patient comes in today for followup of her known obstructive sleep apnea. She failed CPAP trial, and has been successfully using a dental appliance. Most recently, she has had increased symptoms and is not sleeping as well with the device. She has gained 8 pounds since the last visit. She feels that her restless leg syndrome is well controlled as long as she stays on her medications compliantly.   Review of Systems  Constitutional: Negative for fever and unexpected weight change.  HENT: Negative for congestion, dental problem, ear pain, nosebleeds, postnasal drip, rhinorrhea, sinus pressure, sneezing, sore throat and trouble swallowing.   Eyes: Negative for redness and itching.  Respiratory: Negative for cough, chest tightness, shortness of breath and wheezing.   Cardiovascular: Negative for palpitations and leg swelling.  Gastrointestinal: Negative for nausea and vomiting.  Genitourinary: Negative for dysuria.  Musculoskeletal: Negative for joint swelling.  Skin: Negative for rash.  Neurological: Negative for headaches.  Hematological: Does not bruise/bleed easily.  Psychiatric/Behavioral: Negative for dysphoric mood. The patient is not nervous/anxious.        Objective:   Physical Exam Overweight female in no acute distress Nose without purulence or discharge noted Neck without lymphadenopathy or thyromegaly Lower extremities with mild edema, no cyanosis Alert and oriented, moves all 4 extremities. Does not appear to be overly sleepy       Assessment & Plan:

## 2013-06-20 NOTE — Patient Instructions (Signed)
Please talk with your dentist to see if your dental appliance can be advanced further.   Work on weight loss.  A 20 pound weight loss can make a big difference to your sleep apnea Continue your requip for your restless legs.  followup with me in one year if doing well.

## 2013-06-20 NOTE — Assessment & Plan Note (Signed)
The patient initially did very well with her dental appliance, but now has gained 8 pounds since the last visit. She feels that she is not sleeping as well, and feels that it is related to her sleep-disordered breathing. I have asked her to talk with her dentist about possibly advancing her appliance further to see if that will help with her symptoms. Other considerations would be oral surgery, as well as aggressive weight loss. The patient did very poorly with CPAP, and I do not think this is a great option for her.

## 2013-06-21 ENCOUNTER — Other Ambulatory Visit (HOSPITAL_COMMUNITY): Payer: Medicare Other

## 2013-06-22 ENCOUNTER — Other Ambulatory Visit (HOSPITAL_COMMUNITY): Payer: Medicare Other

## 2013-07-02 ENCOUNTER — Other Ambulatory Visit (HOSPITAL_COMMUNITY): Payer: Self-pay | Admitting: Physician Assistant

## 2013-07-10 ENCOUNTER — Encounter: Payer: Medicare Other | Attending: Cardiology | Admitting: Dietician

## 2013-07-10 ENCOUNTER — Ambulatory Visit (INDEPENDENT_AMBULATORY_CARE_PROVIDER_SITE_OTHER): Payer: Medicare Other | Admitting: Cardiology

## 2013-07-10 ENCOUNTER — Encounter: Payer: Self-pay | Admitting: Cardiology

## 2013-07-10 ENCOUNTER — Encounter: Payer: Self-pay | Admitting: Dietician

## 2013-07-10 VITALS — Ht 65.0 in | Wt 225.7 lb

## 2013-07-10 VITALS — BP 114/79 | HR 69 | Wt 226.1 lb

## 2013-07-10 DIAGNOSIS — F3289 Other specified depressive episodes: Secondary | ICD-10-CM | POA: Insufficient documentation

## 2013-07-10 DIAGNOSIS — I471 Supraventricular tachycardia, unspecified: Secondary | ICD-10-CM

## 2013-07-10 DIAGNOSIS — I4949 Other premature depolarization: Secondary | ICD-10-CM

## 2013-07-10 DIAGNOSIS — I519 Heart disease, unspecified: Secondary | ICD-10-CM

## 2013-07-10 DIAGNOSIS — I493 Ventricular premature depolarization: Secondary | ICD-10-CM

## 2013-07-10 DIAGNOSIS — Z6838 Body mass index (BMI) 38.0-38.9, adult: Secondary | ICD-10-CM | POA: Insufficient documentation

## 2013-07-10 DIAGNOSIS — F329 Major depressive disorder, single episode, unspecified: Secondary | ICD-10-CM | POA: Insufficient documentation

## 2013-07-10 DIAGNOSIS — R635 Abnormal weight gain: Secondary | ICD-10-CM | POA: Insufficient documentation

## 2013-07-10 DIAGNOSIS — E669 Obesity, unspecified: Secondary | ICD-10-CM | POA: Insufficient documentation

## 2013-07-10 DIAGNOSIS — R7309 Other abnormal glucose: Secondary | ICD-10-CM | POA: Insufficient documentation

## 2013-07-10 DIAGNOSIS — Z713 Dietary counseling and surveillance: Secondary | ICD-10-CM | POA: Insufficient documentation

## 2013-07-10 DIAGNOSIS — I10 Essential (primary) hypertension: Secondary | ICD-10-CM

## 2013-07-10 NOTE — Patient Instructions (Addendum)
-  Establish structure in meal patterns  -In the middle of the night: have a glass/bottle of flavored water, prunes and almonds  -Set an alarm to get up and have breakfast (eggs, bacon, toast, oatmeal)  -3 to 5 hours later have lunch or a snack ([place] or grilled cheese sandwich, smoothies, spinach)  -Increase protein foods: nuts and nut butters, lean meats like chicken and [place], seafood, eggs, low fat cheese, low fat [place] yogurt (Dannon Light and Fit [place])  -Increase fluid intake  -Practice portion control  -Pay attention to food labels  -Consider using measuring cups  -Eat off of small plates and bowls  -Limit treats  -Fill up on non-starchy vegetables! (any veggie except corn, peas, and potatoes)  -Cooked OR raw  -Rinse canned vegetables first -Choose fresh foods when possible  -Mindful eating  -Pay attention to whether or not you are hungry  -If not hungry, identify emotion  -Find another counselor - Try [facility]  -Increase exercise (lower blood sugar, weight loss, mental health)  -Begin walking or dancing 2 to 3 days a week  -Increase as tolerated  -Walk to mailbox  -Dancing to music  -Chair exercises during commercials  -Look into a source for music  -Make exercise a positive experience!

## 2013-07-10 NOTE — Progress Notes (Signed)
  Medical Nutrition Therapy:  Appt start time: 1200 end time:  1300.   Assessment:  Primary concerns today: [name] is here today to discuss her "high glucose" and weight gain. She reports she craves sweets, especially when she gets too hungry. When she is lonely and bored, she eats for comfort and reports it is difficult for her to stop eating once she starts. She doesn't work and doesn't leave her house much. She states that she has a terrible sleep pattern and gets up several times during the night to snack.  Preferred Learning Style:  No preference indicated   Learning Readiness:  Contemplating  Ready   MEDICATIONS: see list   DIETARY INTAKE:  [name] reports an erratic eating pattern.  24-hr recall:  B ( AM): almond milk, cottage cheese, peach slices; cake  Snk ( AM):   L ( PM): none usually; cake; soup Snk ( PM):  D ( PM): leftovers Snk ( PM): munching in the middle of the night  Usual physical activity: none  Estimated energy needs: 1600 calories 180 g carbohydrates 120 g protein 44 g fat  Progress Towards Goal(s):  No progress.   Nutritional Diagnosis:  [place]-3.3 Overweight/obesity As related to erratic eating pattern, physical inactivity, psychological issues, excessive energy intake, and inappropriate food choices.  As evidenced by BMI 38 and glucose levels above normal limits.    Intervention:  Nutrition counseling provided. Goals: -Establish structure in meal patterns  -In the middle of the night: have a glass/bottle of flavored water, prunes and almonds at bedside  -Set an alarm to get up and have breakfast (eggs, bacon, toast, oatmeal)  -3 to 5 hours later have lunch or a snack ([place] or grilled cheese sandwich, smoothies, spinach) -Increase protein foods: nuts and nut butters, lean meats like chicken and [place], seafood, eggs, low fat cheese, low fat [place] yogurt (Dannon Light and Fit [place]) -Increase fluid intake -Practice portion control  -Pay  attention to food labels  -Consider using measuring cups  -Eat off of small plates and bowls  -Limit treats -Fill up on non-starchy vegetables! (any veggie except corn, peas, and potatoes)  -Cooked OR raw -Rinse canned vegetables first -Choose fresh foods when possible -Mindful eating  -Pay attention to whether or not you are hungry  -If not hungry, identify emotion -Find another counselor - Try [facility] -Increase exercise (lower blood sugar, weight loss, mental health)  -Begin walking or dancing 2 to 3 days a week  -Increase as tolerated  -Walk to mailbox  -Dancing to music  -Chair exercises during commercials  -Look into a source for music  -Make exercise a positive experience!  Teaching Method Utilized: Visual Auditory  Handouts given during visit include:  15g CHO + protein snacks  Barriers to learning/adherence to lifestyle change: depression  Demonstrated degree of understanding via:  Teach Back   Monitoring/Evaluation:  Dietary intake, exercise, and body weight in 2 month(s).

## 2013-07-10 NOTE — Patient Instructions (Signed)
Your physician recommends that you continue on your current medications as directed. Please refer to the Current Medication list given to you today.  Your physician wants you to follow-up in: 1 year with Dr. Turner. You will receive a reminder letter in the mail two months in advance. If you don't receive a letter, please call our office to schedule the follow-up appointment.  

## 2013-07-10 NOTE — Progress Notes (Signed)
[address], [place], [place]  [address] Phone: [phone] Fax:  [phone]  Date:  07/10/2013   ID:  [name], [name] 1946/01/10, MRN [id number]  PCP:   Melinda Crutch, MD  Cardiologist:  Fransico Him, MD     History of Present Illness: [name] is a 68 y.o. female with a history of PVC's, diastolic dysfunction, PAT s/p ablation and HTN who presents today for followup.  She is doing well.  She denies any chest pain, SOB, DOE, LE edema, dizziness, palpitations or syncope.     Wt Readings from Last 3 Encounters:  07/10/13 226 lb 1.6 oz (102.558 kg)  06/20/13 229 lb 6.4 oz (104.055 kg)  04/26/13 221 lb (100.245 kg)     Past Medical History  Diagnosis Date  . Abscess of Bartholin's gland   . History of pleural effusion   . History of attention deficit disorder   . History of endometriosis   . RLS (restless legs syndrome)   . Anxiety   . Obesity   . GERD (gastroesophageal reflux disease)   . Renal mass 02/15/2012  . Depression     Bipolar disorder/goes to [facility] for meds  . OSA (obstructive sleep apnea)     uses oral appliance instead of CPAP  . Vertigo   . Chronic kidney disease     kidney cancer- pt states she has elected to not have it treated.  Marland Kitchen PONV (postoperative nausea and vomiting)   . Difficult intubation     told by [facility] that she was hard to intubate 15b yrs ago in [place]- surgery since then no problems  . Adverse effect of general anesthetic     felt paralyzed while receiving anesthesia  . Bipolar 1 disorder   . PTSD (post-traumatic stress disorder)   . ADHD (attention deficit hyperactivity disorder)   . Migraines   . rt renal ca dx'd 12/2009    no treatment/ no surg  . Acute vestibular neuronitis     Dr Lucia Gaskins  . RLS (restless legs syndrome)     Dr Gwenette Greet  . H/O echocardiogram 07/2012    Normla LVF w grade I siastolic dysfunction   . PVC's (premature ventricular contractions)   . Hypertension   . 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 Dysrhythmia     h/o atrial tachycardia- s/p ablation     Current Outpatient Prescriptions  Medication Sig Dispense Refill  . aspirin 81 MG EC tablet Take 1 tablet (81 mg total) by mouth daily. For heart health  30 tablet  1  . buPROPion (WELLBUTRIN XL) 300 MG 24 hr tablet Take 1 tablet (300 mg total) by mouth daily. For depression  30 tablet  0  . FLUoxetine (PROZAC) 10 MG capsule Take 1 capsule (10 mg total) by mouth daily. For depression  30 capsule  3  . lamoTRIgine (LAMICTAL) 150 MG tablet Take 1 tablet (150 mg total) by mouth 2 (two) times daily. Mood stabilization  60 tablet  0  . levothyroxine (SYNTHROID, LEVOTHROID) 88 MCG tablet Take 1 tablet (88 mcg total) by mouth daily before breakfast. For low thyroid function      . LORazepam (ATIVAN) 1 MG tablet Take 1 tablet (1 mg total) by mouth 3 (three) times daily as needed for anxiety.  30 tablet  0  . nebivolol (BYSTOLIC) 2.5 MG tablet Take 1 tablet (2.5 mg total) by mouth daily. For high blood pressure      .  ondansetron (ZOFRAN) 4 MG tablet       . pantoprazole (PROTONIX) 40 MG tablet Take 1 tablet (40 mg total) by mouth daily. For acid reflux  30 tablet  0  . rOPINIRole (REQUIP) 0.5 MG tablet Take 1 late afternoon and 2 at bedtime      . traMADol (ULTRAM) 50 MG tablet       . valsartan-hydrochlorothiazide (DIOVAN-HCT) 320-25 MG per tablet Take 1 tablet by mouth daily. High blood pressure control  30 tablet  0   No current facility-administered medications for this visit.    Allergies:    Allergies  Allergen Reactions  . Abilify [Aripiprazole] Other (See Comments)    jerking  . Compazine [Prochlorperazine Edisylate] Nausea And Vomiting  . Diflucan [Fluconazole] Nausea Only    HA  . Geodon [Ziprasidone Hydrochloride] Other (See Comments)    Extremely aggitated  . Lithium Nausea Only and Other (See Comments)    Off balance, increased heart rate  . Talwin [Pentazocine] Other  (See Comments)    Chest pain  . Toradol [Ketorolac Tromethamine] Other (See Comments)    hallucinations    Social History:  The patient  reports that she has never smoked. She has never used smokeless tobacco. She reports that she does not drink alcohol or use illicit drugs.   Family History:  The patient's family history includes Alcohol abuse in her father; Allergies in her brother, daughter, father, and sister; Bipolar disorder in her father; CAD in her mother; Cancer in her paternal grandfather; Colon cancer in her paternal grandmother; Depression in her mother; Heart disease in her maternal grandfather and mother; Hypertension in her mother; OCD in her paternal grandmother; Skin cancer in her father.   ROS:  Please see the history of present illness.      All other systems reviewed and negative.   PHYSICAL EXAM: VS:  BP 114/79  Pulse 69  Wt 226 lb 1.6 oz (102.558 kg) Well nourished, well developed, in no acute distress HEENT: normal Neck: no JVD Cardiac:  normal S1, S2; RRR; no murmur Lungs:  clear to auscultation bilaterally, no wheezing, rhonchi or rales Abd: soft, nontender, no hepatomegaly Ext: no edema Skin: warm and dry Neuro:  CNs 2-12 intact, no focal abnormalities noted  EKG:     NSR with no ST changes and normal intervals  ASSESSMENT AND PLAN:  1. HTN well controlled - continue bystolic/Diovan HCT 2. PAT s/p ablation with no reoccurrence 3. PVC's - controlled on Bystolic  Followup with me in 1 year  Signed, Fransico Him, MD 07/10/2013 3:10 PM

## 2013-07-12 ENCOUNTER — Ambulatory Visit
Admission: RE | Admit: 2013-07-12 | Discharge: 2013-07-12 | Disposition: A | Discharge status: Home / Self Care | Payer: Medicaid Other | Source: Ambulatory Visit | Attending: Family Medicine | Admitting: Family Medicine

## 2013-07-12 DIAGNOSIS — R1011 Right upper quadrant pain: Secondary | ICD-10-CM

## 2013-07-16 ENCOUNTER — Telehealth: Payer: Self-pay | Admitting: Oncology

## 2013-07-16 NOTE — Telephone Encounter (Signed)
FS pt - 5/5 appt cxd in error as pt appeared on [facility]'s [facility] list. appt reinstated for 5/13. called pt but was not able to reach her. schedule mailed.

## 2013-07-17 ENCOUNTER — Encounter (HOSPITAL_COMMUNITY): Payer: Self-pay | Admitting: Psychiatry

## 2013-07-17 ENCOUNTER — Ambulatory Visit (INDEPENDENT_AMBULATORY_CARE_PROVIDER_SITE_OTHER): Payer: Medicare Other | Admitting: Psychiatry

## 2013-07-17 VITALS — BP 106/59 | HR 72 | Ht 64.0 in | Wt 225.8 lb

## 2013-07-17 DIAGNOSIS — F4310 Post-traumatic stress disorder, unspecified: Secondary | ICD-10-CM

## 2013-07-17 DIAGNOSIS — F909 Attention-deficit hyperactivity disorder, unspecified type: Secondary | ICD-10-CM

## 2013-07-17 DIAGNOSIS — F411 Generalized anxiety disorder: Secondary | ICD-10-CM

## 2013-07-17 DIAGNOSIS — F332 Major depressive disorder, recurrent severe without psychotic features: Secondary | ICD-10-CM

## 2013-07-17 MED ORDER — LORAZEPAM 1 MG PO TABS: 1.0000 mg | ORAL_TABLET | Freq: Two times a day (BID) | ORAL | Status: DC

## 2013-07-17 MED ORDER — FLUOXETINE HCL 20 MG PO CAPS: 10.0000 mg | ORAL_CAPSULE | Freq: Every day | ORAL | Status: DC
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
## 2013-07-19 ENCOUNTER — Encounter (INDEPENDENT_AMBULATORY_CARE_PROVIDER_SITE_OTHER): Payer: Self-pay | Admitting: General Surgery

## 2013-07-24 ENCOUNTER — Other Ambulatory Visit: Payer: Self-pay

## 2013-07-24 ENCOUNTER — Other Ambulatory Visit: Payer: Self-pay | Admitting: Pulmonary Disease

## 2013-07-24 ENCOUNTER — Ambulatory Visit: Payer: Self-pay | Admitting: Oncology

## 2013-07-24 ENCOUNTER — Telehealth: Payer: Self-pay | Admitting: Oncology

## 2013-07-24 NOTE — Telephone Encounter (Signed)
Pt came by appt moved  from 5/5 due to an error to 5/13 pt ok

## 2013-07-26 ENCOUNTER — Encounter (INDEPENDENT_AMBULATORY_CARE_PROVIDER_SITE_OTHER): Payer: Self-pay | Admitting: General Surgery

## 2013-08-01 ENCOUNTER — Encounter: Payer: Self-pay | Admitting: Oncology

## 2013-08-01 ENCOUNTER — Ambulatory Visit (HOSPITAL_BASED_OUTPATIENT_CLINIC_OR_DEPARTMENT_OTHER): Payer: Medicare Other | Admitting: Oncology

## 2013-08-01 ENCOUNTER — Other Ambulatory Visit (HOSPITAL_BASED_OUTPATIENT_CLINIC_OR_DEPARTMENT_OTHER): Payer: Medicare Other

## 2013-08-01 VITALS — BP 103/70 | HR 72 | Temp 98.1°F | Resp 18 | Ht 64.0 in | Wt 225.5 lb

## 2013-08-01 DIAGNOSIS — N2889 Other specified disorders of kidney and ureter: Secondary | ICD-10-CM

## 2013-08-01 DIAGNOSIS — N289 Disorder of kidney and ureter, unspecified: Secondary | ICD-10-CM

## 2013-08-01 DIAGNOSIS — F3289 Other specified depressive episodes: Secondary | ICD-10-CM

## 2013-08-01 DIAGNOSIS — F329 Major depressive disorder, single episode, unspecified: Secondary | ICD-10-CM

## 2013-08-01 LAB — COMPREHENSIVE METABOLIC PANEL (CC13)
ALT: 20 U/L (ref 0–55)
AST: 18 U/L (ref 5–34)
Albumin: 3.7 g/dL (ref 3.5–5.0)
Alkaline Phosphatase: 96 U/L (ref 40–150)
Anion Gap: 13 mEq/L — ABNORMAL HIGH (ref 3–11)
BUN: 19.4 mg/dL (ref 7.0–26.0)
CO2: 24 mEq/L (ref 22–29)
Calcium: 10.2 mg/dL (ref 8.4–10.4)
Chloride: 104 mEq/L (ref 98–109)
Creatinine: 1 mg/dL (ref 0.6–1.1)
Glucose: 118 mg/dl (ref 70–140)
Potassium: 3.9 mEq/L (ref 3.5–5.1)
Sodium: 141 mEq/L (ref 136–145)
Total Bilirubin: 0.37 mg/dL (ref 0.20–1.20)
Total Protein: 7.5 g/dL (ref 6.4–8.3)

## 2013-08-01 LAB — CBC WITH DIFFERENTIAL/PLATELET
BASO%: 0.5 % (ref 0.0–2.0)
Basophils Absolute: 0 10*3/uL (ref 0.0–0.1)
EOS%: 2.4 % (ref 0.0–7.0)
Eosinophils Absolute: 0.1 10*3/uL (ref 0.0–0.5)
HCT: 40.3 % (ref 34.8–46.6)
HGB: 13.2 g/dL (ref 11.6–15.9)
LYMPH%: 24.2 % (ref 14.0–49.7)
MCH: 27.6 pg (ref 25.1–34.0)
MCHC: 32.7 g/dL (ref 31.5–36.0)
MCV: 84.6 fL (ref 79.5–101.0)
MONO#: 0.3 10*3/uL (ref 0.1–0.9)
MONO%: 5.3 % (ref 0.0–14.0)
NEUT#: 3.5 10*3/uL (ref 1.5–6.5)
NEUT%: 67.6 % (ref 38.4–76.8)
Platelets: 277 10*3/uL (ref 145–400)
RBC: 4.77 10*6/uL (ref 3.70–5.45)
RDW: 14.7 % — ABNORMAL HIGH (ref 11.2–14.5)
WBC: 5.2 10*3/uL (ref 3.9–10.3)
lymph#: 1.3 10*3/uL (ref 0.9–3.3)

## 2013-08-01 NOTE — Progress Notes (Signed)
Hematology and Oncology Follow Up Visit  [name] [id number] 1946-01-16 68 y.o. 08/01/2013 1:51 PM  CC: Antony Haste (C.Alan) Harrington Challenger, M.D.    Principle Diagnosis: This is a 68 year old female with a renal mass, presented with 4.6 x 4.4 x 3.1 cm right renal mass suspicious for a neoplasm.  The patient refused a biopsy or primary treatment. Diagnosed in 2011.   Current therapy: Observation and surveillance. Followup CT scan in November of 2014 continue to show stable disease.  Interim History:  Ms. Passey presents today for a followup visit, a pleasant 68 year old with a renal mass that is suspicious for a malignancy; however, she has refused workup or treatment.  Since the last time I saw her, she has reported any flank pain and now have resolved.  She did not report any hematuria or dysuria. She has not reported any back pain.  She does report some occasional back discomfort that have resolved now. It is not radiating. It is not associated with any neurological deficits.   Performance status and activity level remain reasonable. She is reporting that her depression is about the same at this point. She is following up with psychiatry and continued to have intermittent counseling. She had not had any fevers or chills or sweats. Has not had any abdominal pain or change in her bowel habits. She does report nausea but no vomiting.  Medications: I have reviewed the patient's current medications. Current Outpatient Prescriptions  Medication Sig Dispense Refill  . aspirin 81 MG EC tablet Take 1 tablet (81 mg total) by mouth daily. For heart health  30 tablet  1  . buPROPion (WELLBUTRIN XL) 300 MG 24 hr tablet Take 1 tablet (300 mg total) by mouth daily. For depression  30 tablet  0  . FLUoxetine (PROZAC) 20 MG capsule Take 1 capsule (20 mg total) by mouth daily. For depression  30 capsule  3  . lamoTRIgine (LAMICTAL) 150 MG tablet Take 1 tablet (150 mg total) by mouth 2 (two) times daily. Mood  stabilization  60 tablet  0  . levothyroxine (SYNTHROID, LEVOTHROID) 88 MCG tablet Take 1 tablet (88 mcg total) by mouth daily before breakfast. For low thyroid function      . LORazepam (ATIVAN) 1 MG tablet Take 1 tablet (1 mg total) by mouth 2 (two) times daily.  60 tablet  0  . nebivolol (BYSTOLIC) 2.5 MG tablet Take 1 tablet (2.5 mg total) by mouth daily. For high blood pressure      . ondansetron (ZOFRAN) 4 MG tablet       . pantoprazole (PROTONIX) 40 MG tablet Take 1 tablet (40 mg total) by mouth daily. For acid reflux  30 tablet  0  . rOPINIRole (REQUIP) 0.5 MG tablet Take 1 late afternoon and 2 at bedtime      . rOPINIRole (REQUIP) 0.5 MG tablet TAKE 1 TABLET BY MOUTH LATE IN THE AFTERNOON AND 2 TABS AFTER DINNER  90 tablet  3  . traMADol (ULTRAM) 50 MG tablet       . valsartan-hydrochlorothiazide (DIOVAN-HCT) 320-25 MG per tablet Take 1 tablet by mouth daily. High blood pressure control  30 tablet  0   No current facility-administered medications for this visit.    Allergies:  Allergies  Allergen Reactions  . Abilify [Aripiprazole] Other (See Comments)    jerking  . Compazine [Prochlorperazine Edisylate] Nausea And Vomiting  . Diflucan [Fluconazole] Nausea Only    HA  . Geodon [Ziprasidone Hydrochloride] Other (See Comments)  Extremely aggitated  . Lithium Nausea Only and Other (See Comments)    Off balance, increased heart rate  . Talwin [Pentazocine] Other (See Comments)    Chest pain  . Toradol [Ketorolac Tromethamine] Other (See Comments)    hallucinations      Physical Exam: Blood pressure 103/70, pulse 72, temperature 98.1 F (36.7 C), temperature source Oral, resp. rate 18, height 5\' 4"  (1.626 m), weight 225 lb 8 oz (102.286 kg), SpO2 98.00%. ECOG: 1 General appearance: alert and awake not in any distress. Head: Normocephalic, without obvious abnormality, atraumatic Neck: no adenopathy, no masses. Lymph nodes: Cervical, supraclavicular, and axillary nodes  normal. Heart:regular rate and rhythm, S1, S2 normal, no murmur, click, rub or gallop Lung:chest clear, no wheezing, rales, normal symmetric air entry Abdomen: soft, non-tender, without masses or organomegaly EXT:no erythema, induration, or nodules   Lab Results: Lab Results  Component Value Date   WBC 5.2 08/01/2013   HGB 13.2 08/01/2013   HCT 40.3 08/01/2013   MCV 84.6 08/01/2013   PLT 277 08/01/2013     Chemistry      Component Value Date/Time   NA 138 04/26/2013 1715   NA 141 01/24/2013 0912   NA 143 06/02/2011 1008   K 4.1 04/26/2013 1715   K 4.0 01/24/2013 0912   K 3.9 06/02/2011 1008   CL 97 04/26/2013 1715   CL 103 07/21/2012 1415   CL 95* 06/02/2011 1008   CO2 27 04/26/2013 1715   CO2 27 01/24/2013 0912   CO2 29 06/02/2011 1008   BUN 16 04/26/2013 1715   BUN 16.1 01/24/2013 0912   BUN 14 06/02/2011 1008   CREATININE 0.79 04/26/2013 1715   CREATININE 0.8 01/24/2013 0912   CREATININE 0.8 06/02/2011 1008      Component Value Date/Time   CALCIUM 10.8* 04/26/2013 1715   CALCIUM 10.1 01/24/2013 0912   CALCIUM 9.4 06/02/2011 1008   ALKPHOS 97 01/24/2013 0912   ALKPHOS 105 06/29/2012 1030   ALKPHOS 90* 06/02/2011 1008   AST 18 01/24/2013 0912   AST 25 06/29/2012 1030   AST 25 06/02/2011 1008   ALT 17 01/24/2013 0912   ALT 21 06/29/2012 1030   ALT 27 06/02/2011 1008   BILITOT 0.40 01/24/2013 0912   BILITOT 0.3 06/29/2012 1030   BILITOT 0.70 06/02/2011 1008      EXAM:  CT ABDOMEN AND PELVIS WITHOUT AND WITH CONTRAST  TECHNIQUE:  Multidetector CT imaging of the abdomen and pelvis was performed  without contrast material in one or both body regions, followed by  contrast material(s) and further sections in one or both body  regions.  CONTRAST: 189mL OMNIPAQUE IOHEXOL 300 MG/ML SOLN  COMPARISON: CT 06/02/2011.  FINDINGS:  Again noted is a solid approximately 5.5 x 5.7 cm right renal mass  in the interpolar region. This mass enhances. This is consistent  with renal cell carcinoma. Bilateral  prominent renal cysts are again  noted and are unchanged. Large right upper renal pole cyst measuring  7.1 cm in maximum diameter contains rim calcifications. An adjacent  1.5 cm fat containing structure in the right kidney just superior to  the solid renal mass is noted. 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 No hydronephrosis or evidence of  obstructing ureteral stone. The bladder is nondistended. Renal vein  and IVC appear patent. Adrenals normal. Fibroid uterus. No adnexal  mass or free pelvic fluid.  No focal hepatic abnormality identified. Hepatic veins and portal  vein  are patent. Stable small splenic lesions are noted. Although  metastatic disease or process such as leukemia/lymphoma could  present in this fashion, statistically they most likely represent  benign vascular lesions. These are unchanged. Pancreas is normal.  Gallstones . There is no gallbladder distention. No biliary  distention.  No significant retroperitoneal adenopathy. No significant inguinal  adenopathy noted. The abdominal aorta is normal in caliber. Its  branch vessels including the renal arteries are patent.  Appendix normal. No bowel distention. No free air. Stomach  nondistended. Distal esophagus unremarkable.  Heart size normal. Mild atelectasis lung bases. On coronal and  sagittal reconstructions there is a questionable lucency in the T12  vertebral body. To evaluate for metastatic disease bone scan and/or  MRI should be considered.  IMPRESSION:  1. Stable solid enhancing 5.5 x 5.7 right renal mass in the right  interpolar region. This is most consistent with renal cell  carcinoma. The perirenal tissues and renal vasculature are stable.  No evidence of renal vein thrombosis. No evidence of significant  retroperitoneal adenopathy.  2. Adjacent tiny right renal angiomyolipoma. Bilateral renal stable  cysts.  3. Multiple punctate splenic lucencies as described above.   Statistically these most likely represent benign vascular lesions.  Process such as metastatic disease or leukemia/lymphoma cannot be  entirely excluded. Again these are unchanged from prior scan.  4. Gallstones .  5. Questionable lucency in T12 vertebral body seen on coronal and  sagittal reconstructions. Bone scan and/or MRI is suggested for  further evaluation to exclude metastatic disease.     Impression and Plan:   68 year old female with the following issues:  1. A renal mass on the right side very suspicious for malignancy.  She has continued to be adamant in refusing any further treatment.  CT scan from November 2014 was discussed, and continues to have stable disease. At this point, we will proceed with a more palliative approach.  If she develops any symptoms, then we will address that as they arise.  At this point, she does not have any symptoms and no reason for any intervention. We'll obtain imaging studies as needed. 2. Depression.  This seems to be under reasonable control.     3. Followup will be in 6 months' time.     Wyatt Portela 5/13/20151:51 PM

## 2013-08-02 ENCOUNTER — Ambulatory Visit (INDEPENDENT_AMBULATORY_CARE_PROVIDER_SITE_OTHER): Payer: Medicaid Other | Admitting: General Surgery

## 2013-08-04 ENCOUNTER — Telehealth: Payer: Self-pay | Admitting: Oncology

## 2013-08-04 NOTE — Telephone Encounter (Signed)
lvm for pt regarding to [facility] appt....mailed pt appt schecd/avs and letter

## 2013-08-06 ENCOUNTER — Encounter (INDEPENDENT_AMBULATORY_CARE_PROVIDER_SITE_OTHER): Payer: Self-pay | Admitting: General Surgery

## 2013-08-21 ENCOUNTER — Encounter (HOSPITAL_COMMUNITY): Payer: Self-pay | Admitting: Psychiatry

## 2013-08-21 ENCOUNTER — Ambulatory Visit (INDEPENDENT_AMBULATORY_CARE_PROVIDER_SITE_OTHER): Payer: Medicare Other | Admitting: Psychiatry

## 2013-08-21 VITALS — BP 109/66 | HR 68 | Ht 65.0 in | Wt 221.4 lb

## 2013-08-21 DIAGNOSIS — F4310 Post-traumatic stress disorder, unspecified: Secondary | ICD-10-CM

## 2013-08-21 DIAGNOSIS — F332 Major depressive disorder, recurrent severe without psychotic features: Secondary | ICD-10-CM

## 2013-08-21 MED ORDER — LORAZEPAM 1 MG PO TABS: 1.0000 mg | ORAL_TABLET | Freq: Two times a day (BID) | ORAL | Status: DC

## 2013-08-21 MED ORDER — LAMOTRIGINE 150 MG PO TABS: 150.0000 mg | ORAL_TABLET | Freq: Two times a day (BID) | ORAL | Status: DC

## 2013-08-21 MED ORDER — BUPROPION HCL ER (XL) 300 MG PO TB24: 300.0000 mg | ORAL_TABLET | Freq: Every day | ORAL | Status: DC

## 2013-08-21 MED ORDER — FLUOXETINE HCL 40 MG PO CAPS: 40.0000 mg | ORAL_CAPSULE | Freq: Every day | ORAL | Status: DC

## 2013-08-21 NOTE — Progress Notes (Signed)
   [facility] Follow-up Outpatient Visit  [name] Dec 26, 1945  Date:  08/21/13  Subjective: Pt is here for follow up Pt is tearful. She remains depressed and anxious, ie c/o poor sleep, appetite, anhedonia. She has refractory depression, and has been on multiple meds, and gone to many therapies. She remains hopeless at times. Depression 7/10, Anxiety 4/10. She used to go to [facility] for her services. Will go up on fluoxetine to 40 mg po, and continue bupropion XL 300 mg po daily, Lamictal 150 mg, twice daily. She denies SI/HI/AVH. Rtc in 4 weeks.    Filed Vitals:   08/21/13 1312  BP: 109/66  Pulse: 68    Mental Status Examination  Appearance: casual  Alert: Yes Attention: fair  Cooperative: Yes Eye Contact: Fair Speech: slow  Psychomotor Activity: Psychomotor Retardation Memory/Concentration: fair  Oriented: time/date and day of week Mood: Anxious and Dysphoric Affect: Depressed and Flat Thought Processes and Associations: [place] of Knowledge: Fair Thought Content: preoccupations Insight: Fair Judgement: Fair  Diagnosis: MDD, recurrent, severe PTSD   Treatment Plan:  Rtc in 4 weeks Bupropion XL 300 mg po QD Fluoxetine 40 mg po QD for depression Lamictal 150 mg, 2 times daily as mood stabilization Lorazepam 1 mg, 2 times daily Therapy with Irving Shows, NP

## 2013-09-05 ENCOUNTER — Ambulatory Visit (HOSPITAL_COMMUNITY): Payer: Self-pay | Admitting: Psychiatry

## 2013-09-06 ENCOUNTER — Ambulatory Visit: Payer: Self-pay | Admitting: Dietician

## 2013-09-07 ENCOUNTER — Telehealth (HOSPITAL_COMMUNITY): Payer: Self-pay

## 2013-09-07 DIAGNOSIS — F332 Major depressive disorder, recurrent severe without psychotic features: Secondary | ICD-10-CM

## 2013-09-07 MED ORDER — FLUOXETINE HCL 10 MG PO CAPS: 30.0000 mg | ORAL_CAPSULE | Freq: Every day | ORAL | Status: DC

## 2013-09-17 ENCOUNTER — Telehealth: Payer: Self-pay | Admitting: Pulmonary Disease

## 2013-09-17 DIAGNOSIS — G2581 Restless legs syndrome: Secondary | ICD-10-CM

## 2013-09-17 NOTE — Telephone Encounter (Signed)
Can increase requip to 2 in the afternoon, and 2 after dinner. Also explain to her that iron deficiency can worsen restless legs. Would like her to come by and get bloodwork:  Needs serum iron, TIBC, serum ferritin.

## 2013-09-17 NOTE — Telephone Encounter (Signed)
[email or web address].Howell Groesbeck, CMA  

## 2013-09-17 NOTE — Telephone Encounter (Signed)
Called spoke with pt. She reports her RLS is getting worse. She has noticed more frequently the RLS is becoming a problem earlier on now in the day. Usually starts about 3 PM. She takes requip 0.5 mg 3 tablets daily. 1 after dinner about 7 PM and 1 hr before bedtime 11 PM. Pt wants  to know if she can increase this? She reports it is very bothersome and it getting worse daily. Please advise KC thanks  Allergies  Allergen Reactions  . Abilify [Aripiprazole] Other (See Comments)    jerking  . Compazine [Prochlorperazine Edisylate] Nausea And Vomiting  . Diflucan [Fluconazole] Nausea Only    HA  . Geodon [Ziprasidone Hydrochloride] Other (See Comments)    Extremely aggitated  . Lithium Nausea Only and Other (See Comments)    Off balance, increased heart rate  . Talwin [Pentazocine] Other (See Comments)    Chest pain  . Toradol [Ketorolac Tromethamine] Other (See Comments)    hallucinations

## 2013-09-18 NOTE — Telephone Encounter (Signed)
Pt aware of recs. Orders placed.  Nothing further needed

## 2013-09-20 ENCOUNTER — Ambulatory Visit (INDEPENDENT_AMBULATORY_CARE_PROVIDER_SITE_OTHER): Payer: Medicare Other | Admitting: General Surgery

## 2013-09-20 ENCOUNTER — Other Ambulatory Visit (INDEPENDENT_AMBULATORY_CARE_PROVIDER_SITE_OTHER): Payer: Medicare Other

## 2013-09-20 DIAGNOSIS — G2581 Restless legs syndrome: Secondary | ICD-10-CM

## 2013-09-20 LAB — FERRITIN: Ferritin: 68 ng/mL (ref 10.0–291.0)

## 2013-09-21 LAB — IRON AND TIBC
%SAT: 20 % (ref 20–55)
Iron: 69 ug/dL (ref 42–145)
TIBC: 348 ug/dL (ref 250–470)
UIBC: 279 ug/dL (ref 125–400)

## 2013-09-25 ENCOUNTER — Encounter (HOSPITAL_COMMUNITY): Payer: Self-pay | Admitting: Psychiatry

## 2013-09-25 ENCOUNTER — Ambulatory Visit (HOSPITAL_COMMUNITY): Payer: Federal, State, Local not specified - Other | Admitting: Psychiatry

## 2013-09-25 VITALS — BP 123/77 | HR 64 | Ht 65.0 in | Wt 217.0 lb

## 2013-09-25 DIAGNOSIS — F329 Major depressive disorder, single episode, unspecified: Secondary | ICD-10-CM | POA: Insufficient documentation

## 2013-09-25 DIAGNOSIS — F332 Major depressive disorder, recurrent severe without psychotic features: Secondary | ICD-10-CM

## 2013-09-25 DIAGNOSIS — F4310 Post-traumatic stress disorder, unspecified: Secondary | ICD-10-CM

## 2013-09-25 MED ORDER — FLUOXETINE HCL 20 MG PO TABS
20.0000 mg | ORAL_TABLET | Freq: Every day | ORAL | Status: DC
Start: 2013-09-25 — End: 2013-11-16

## 2013-09-25 MED ORDER — BUPROPION HCL ER (XL) 300 MG PO TB24: 300.0000 mg | ORAL_TABLET | Freq: Every day | ORAL | Status: DC

## 2013-09-25 MED ORDER — LAMOTRIGINE 150 MG PO TABS: 150.0000 mg | ORAL_TABLET | Freq: Two times a day (BID) | ORAL | Status: DC

## 2013-09-25 NOTE — Progress Notes (Unsigned)
   [place] Follow-up Outpatient Visit  CHELSAE ZANELLA 18-Jan-1946  Date:  09/25/13   Subjective: Pt is here for follow up depression/ptsd Sleeping is good; appetite fluctuates. She denies SI/HI/AVH. She is tolerating her medications, bupropion XL 300 mg, fluoxetine 20 mg, and Lamictal 150 mg, 2 times daily. Attempted the last time to increase fluoxetine, but went from 40 mg, now down to 20 mg because of side effect. She just didn't feel right with increased dose. Mood is stable. She denies crying spells, low energy, and poor concentration. She says she is doing the best she has ever done. She is going to therapy to work on PTSD symptoms.  Rtc in 4 weeks.   There were no vitals filed for this visit.  Mental Status Examination  Appearance: casual  Alert: Yes Attention: fair  Cooperative: Yes Eye Contact: Fair Speech: slow Psychomotor Activity: Psychomotor Retardation Memory/Concentration: fair  Oriented: time/date and month of year Mood: Anxious and Dysphoric Affect: Constricted Thought Processes and Associations: Coherent Fund of Knowledge: Fair Thought Content: preccupations Insight: Fair Judgement: Fair  Diagnosis:  MDD, recurrent, severe, without psychosis PTSD Treatment Plan:  Rtc in 4 weeks Bupropion XL 300 mg po for depressive phase of bipolar Fluoxetine 20 mg po for depression Lamictal 150 mg, 2 times daily for mood stabilization.   Madison Hickman, NP

## 2013-09-26 ENCOUNTER — Ambulatory Visit (INDEPENDENT_AMBULATORY_CARE_PROVIDER_SITE_OTHER): Payer: Medicare Other | Admitting: Psychiatry

## 2013-09-26 DIAGNOSIS — F332 Major depressive disorder, recurrent severe without psychotic features: Secondary | ICD-10-CM

## 2013-09-26 DIAGNOSIS — F4310 Post-traumatic stress disorder, unspecified: Secondary | ICD-10-CM
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
## 2013-10-10 ENCOUNTER — Ambulatory Visit (INDEPENDENT_AMBULATORY_CARE_PROVIDER_SITE_OTHER): Payer: Self-pay | Admitting: General Surgery

## 2013-10-19 ENCOUNTER — Ambulatory Visit (HOSPITAL_COMMUNITY): Payer: Self-pay | Admitting: Psychiatry

## 2013-10-31 ENCOUNTER — Ambulatory Visit (HOSPITAL_COMMUNITY): Payer: Self-pay | Admitting: Psychiatry

## 2013-11-06 ENCOUNTER — Other Ambulatory Visit: Payer: Self-pay | Admitting: *Deleted

## 2013-11-06 MED ORDER — NEBIVOLOL HCL 5 MG PO TABS: 2.5000 mg | ORAL_TABLET | Freq: Every day | ORAL | Status: DC

## 2013-11-07 ENCOUNTER — Ambulatory Visit (INDEPENDENT_AMBULATORY_CARE_PROVIDER_SITE_OTHER): Payer: Medicaid Other | Admitting: General Surgery

## 2013-11-15 ENCOUNTER — Ambulatory Visit (HOSPITAL_COMMUNITY): Payer: Self-pay | Admitting: Psychiatry

## 2013-11-16 ENCOUNTER — Ambulatory Visit (INDEPENDENT_AMBULATORY_CARE_PROVIDER_SITE_OTHER): Payer: 59 | Admitting: Psychiatry

## 2013-11-16 ENCOUNTER — Encounter (HOSPITAL_COMMUNITY): Payer: Self-pay | Admitting: Psychiatry

## 2013-11-16 DIAGNOSIS — F332 Major depressive disorder, recurrent severe without psychotic features: Secondary | ICD-10-CM

## 2013-11-16 DIAGNOSIS — F4310 Post-traumatic stress disorder, unspecified: Secondary | ICD-10-CM

## 2013-11-16 DIAGNOSIS — F331 Major depressive disorder, recurrent, moderate: Secondary | ICD-10-CM

## 2013-11-16 DIAGNOSIS — F411 Generalized anxiety disorder: Secondary | ICD-10-CM

## 2013-11-16 MED ORDER — LAMOTRIGINE 200 MG PO TABS: 200.0000 mg | ORAL_TABLET | Freq: Every day | ORAL | Status: DC

## 2013-11-16 MED ORDER — CLONAZEPAM 0.5 MG PO TABS: 0.5000 mg | ORAL_TABLET | Freq: Two times a day (BID) | ORAL | Status: DC

## 2013-11-16 MED ORDER — LAMOTRIGINE 150 MG PO TABS: 150.0000 mg | ORAL_TABLET | Freq: Two times a day (BID) | ORAL | Status: DC

## 2013-11-16 MED ORDER — BUPROPION HCL ER (XL) 300 MG PO TB24: 300.0000 mg | ORAL_TABLET | Freq: Every day | ORAL | Status: DC

## 2013-11-16 MED ORDER — FLUOXETINE HCL 20 MG PO TABS: 20.0000 mg | ORAL_TABLET | Freq: Every day | ORAL | Status: DC

## 2013-11-16 NOTE — Progress Notes (Signed)
   [place] Follow-up Outpatient Visit  KAYLEY ZEIDERS 1946-01-02  Date:  11/16/13 Subjective:  Sleeping is better, and has hx of sleep apnea. She has some flashbacks, and nightmares. Appetite is poorly. Mood is anxious. I'm not doing anything. I fear that my anxiety about depression is going back. She has a lot of financial constraints right now. She is tearful. She is seeing a therapist, every week. Pt is easily distractible, wants to start on an ADD medication, mentioned it the last 5 minutes of appointment. Will discuss further next visit.   There were no vitals filed for this visit.  Mental Status Examination  Appearance: casual  Alert: Yes Attention: fair  Cooperative: Yes Eye Contact: Fair Speech: wnl  Psychomotor Activity: Decreased Memory/Concentration: fair  Oriented: time/date and day of week Mood: Anxious and Dysphoric Affect: Restricted Thought Processes and Associations: [place] of Knowledge: Fair Thought Content: preoccupations Insight: Fair Judgement: Fair  Diagnosis:  MDD, recurrent, moderate GAD PTSD  Treatment Plan:  Bupropion XL 300 mg po for depression Fluoxetine 30 mg Lamictal 200 mg for mood   Madison Hickman, NP

## 2013-11-22 ENCOUNTER — Ambulatory Visit (INDEPENDENT_AMBULATORY_CARE_PROVIDER_SITE_OTHER): Payer: Medicare Other | Admitting: General Surgery

## 2013-11-25 ENCOUNTER — Other Ambulatory Visit: Payer: Self-pay | Admitting: Pulmonary Disease

## 2013-12-03 ENCOUNTER — Ambulatory Visit (INDEPENDENT_AMBULATORY_CARE_PROVIDER_SITE_OTHER): Payer: 59 | Admitting: Psychiatry

## 2013-12-03 DIAGNOSIS — F4310 Post-traumatic stress disorder, unspecified: Secondary | ICD-10-CM

## 2013-12-03 DIAGNOSIS — F332 Major depressive disorder, recurrent severe without psychotic features: Secondary | ICD-10-CM

## 2013-12-04 ENCOUNTER — Other Ambulatory Visit (HOSPITAL_COMMUNITY): Payer: Self-pay | Admitting: Psychiatry

## 2013-12-04 MED ORDER — CLONAZEPAM 0.5 MG PO TABS: 0.5000 mg | ORAL_TABLET | Freq: Two times a day (BID) | ORAL | Status: DC
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
## 2013-12-10 ENCOUNTER — Ambulatory Visit (INDEPENDENT_AMBULATORY_CARE_PROVIDER_SITE_OTHER): Payer: 59 | Admitting: Psychiatry

## 2013-12-10 DIAGNOSIS — F4310 Post-traumatic stress disorder, unspecified: Secondary | ICD-10-CM

## 2013-12-10 DIAGNOSIS — F332 Major depressive disorder, recurrent severe without psychotic features: Secondary | ICD-10-CM

## 2013-12-10 NOTE — Progress Notes (Signed)
   THERAPIST PROGRESS NOTE  Session Time: 1:00-2:00   Participation Level: Active   Behavioral Response: CasualAlertAnxious   Type of Therapy: Individual Therapy   Treatment Goals addressed: Anxiety   Interventions: CBT   Summary: [name] is a 68 y.o. female who presents with anxiety and depression.   Suicidal/Homicidal: Nowithout intent/plan   Therapist Response: Pt. Smiles and laughs appropriately, intermittently tearful when discussing the absence of emotional support and burden of long-term management of depression. Pt. Processed experience of grieving loss of relationship with daughter and guilt about not actively repairing the relationship at this time. Pt. Processed development of desire to return to work and discussed the consequences of returning to work financially and socially. Pt. Processed recent social interactions. Pt. Acknowledged awareness of ability to positively connect with friends of her choosing and countering thoughts of self-loathing and judgment because of toxic family relationships. Pt. Was encouraged to continue extending herself in social relationships- romantic and friendship that uplift her. Pt. Was given referrals for vocational rehabilitation and Pension scheme manager.   Plan: Return again in 2-4 weeks.   Diagnosis: Axis I: Depressive Disorder NOS   Axis II: No diagnosis     Renford Dills 12/10/2013

## 2013-12-17 ENCOUNTER — Ambulatory Visit (INDEPENDENT_AMBULATORY_CARE_PROVIDER_SITE_OTHER): Payer: 59 | Admitting: Psychiatry

## 2013-12-17 DIAGNOSIS — F332 Major depressive disorder, recurrent severe without psychotic features: Secondary | ICD-10-CM

## 2013-12-17 NOTE — Progress Notes (Signed)
   THERAPIST PROGRESS NOTE  Session Time: 1:00-2:00   Participation Level: Active   Behavioral Response: CasualAlertAnxious   Type of Therapy: Individual Therapy   Treatment Goals addressed: Anxiety   Interventions: CBT   Summary: [name] is a 68 y.o. female who presents with anxiety and depression.   Suicidal/Homicidal: Nowithout intent/plan   Therapist Response: Pt. Continues to smile and laugh appropriately. Pt. Reports that she is mildly anxious and feeling uncomfortable due to something that she has eaten. Pt. Discussed status of friend relationships that are positive sources of support. Pt. Reports that nights are difficult for her because of darkness, boredom and loneliness. Significant time in session discussing change of sleep schedule. Pt. Was encouraged to set her alarm for 2-3 hours earlier so that she can go to sleep 2-3 hours earlier, and Pt. Was open to experimenting with this recommendation. Pt. Discussed pattern of allowing others to overstep her personal boundaries. Pt. Identified tendency to attempt to save others from their pain and discomfort. Discussed what the consequences/pain is for allowing others to overstep boundaries. Pt. Reported that she will be moving soon, which is resolution to what has been a major source of stress. Pt. Reviewed progress over past 6 months with addressing her housing issue.  Plan: Return again in 2-4 weeks.   Diagnosis: Axis I: Depressive Disorder NOS   Axis II: No diagnosis      Renford Dills 12/17/2013

## 2013-12-18 ENCOUNTER — Ambulatory Visit (HOSPITAL_COMMUNITY): Payer: Self-pay | Admitting: Psychiatry

## 2013-12-20 ENCOUNTER — Telehealth (HOSPITAL_COMMUNITY): Payer: Self-pay | Admitting: *Deleted

## 2013-12-20 NOTE — Telephone Encounter (Signed)
error 

## 2013-12-20 NOTE — Telephone Encounter (Signed)
Recommend pt try lower dose of Klonopin at 0.25mg  BID prn and see if that helps.

## 2013-12-20 NOTE — Telephone Encounter (Signed)
Rolland Bimler, RN at 12/20/2013  3:28 PM      Status: Signed            Patient left DJ:SHFW to see NP in office.Due to NP leaving, will now be seeing Dr. Doyne Keel. Appt on 10/13.  Wants to know if she can stop Klonopin - it has not helped and is too sedating. Would like to go back to taking Ativan as she did in past. [Last RX for Ativan 1 mg, BID.Was written 08/21/13]     [name], MD at 12/20/2013  4:17 PM      Status: Signed            Recommend pt try lower dose of Klonopin at 0.25mg  BID prn and see if that helps.       Phoned patient and left message with MD recommendations

## 2013-12-20 NOTE — Telephone Encounter (Signed)
Patient left VN:RWCH to see NP in office.Due to NP leaving, will now be seeing Dr. Doyne Keel. Appt on 10/13.  Wants to know if she can stop Klonopin - it has not helped and is too sedating. Would like to go back to taking Ativan as she did in past. [Last RX for Ativan 1 mg, BID.Was written 08/21/13]

## 2013-12-25 ENCOUNTER — Telehealth (HOSPITAL_COMMUNITY): Payer: Self-pay

## 2013-12-25 NOTE — Telephone Encounter (Signed)
Called and left voice message for call back

## 2013-12-26 ENCOUNTER — Telehealth (HOSPITAL_COMMUNITY): Payer: Self-pay

## 2013-12-27 NOTE — Telephone Encounter (Signed)
Spoke with pt who states she was changed from Ativan to [place] by [employer]. States Klonopin is not as effective and it makes it her sleepy. Pt tried Klonopin 0.25mg  BID and states it made her less sleepy but didn't help with her anxiety.   A/P: MDD, PTSD Last script for Ativan written in June 2015. Will meet with pt next week at scheduled appt to discuss how to best manage her anxiety. Continue Klonopin 0.5mg  BID prn anxiety. Pt verbalized agreement with plan.

## 2014-01-01 ENCOUNTER — Encounter (HOSPITAL_COMMUNITY): Payer: Self-pay | Admitting: Psychiatry

## 2014-01-01 ENCOUNTER — Ambulatory Visit (INDEPENDENT_AMBULATORY_CARE_PROVIDER_SITE_OTHER): Payer: 59 | Admitting: Psychiatry

## 2014-01-01 VITALS — BP 132/95 | HR 75

## 2014-01-01 DIAGNOSIS — F4310 Post-traumatic stress disorder, unspecified: Secondary | ICD-10-CM

## 2014-01-01 DIAGNOSIS — F3130 Bipolar disorder, current episode depressed, mild or moderate severity, unspecified: Secondary | ICD-10-CM

## 2014-01-01 MED ORDER — LAMOTRIGINE 200 MG PO TABS: 200.0000 mg | ORAL_TABLET | Freq: Every day | ORAL | Status: DC

## 2014-01-01 MED ORDER — LORAZEPAM 1 MG PO TABS: 1.0000 mg | ORAL_TABLET | Freq: Two times a day (BID) | ORAL | Status: DC

## 2014-01-01 MED ORDER — FLUOXETINE HCL 20 MG PO TABS: 20.0000 mg | ORAL_TABLET | Freq: Every day | ORAL | Status: DC

## 2014-01-01 MED ORDER — BUPROPION HCL ER (XL) 300 MG PO TB24: 300.0000 mg | ORAL_TABLET | Freq: Every day | ORAL | Status: DC

## 2014-01-01 NOTE — Progress Notes (Signed)
[facility] [phone] Progress Note  [name] [id number] 68 y.o.  01/01/2014 11:48 AM  Chief Complaint: depression  History of Present Illness: Pt was 16 min was late for today's appt.  States winters are the worse for her. It is getting darker earlier and all she wants to do is sleep. In the past she has used a full spectrum light box and it has helped.   Depression is getting worse. She is having crying spells and low motivation. Reports multiple on going stressors. Sleeping more due to fatigue. Appetite is poor but she is craving carbs and she has gained weight.  Endorsing anhedonia and hopelessness.   States Klonopin is ineffective and makes her tired. States Ativan worked better. Anxiety is a little better with Bystolic.   Wellbutrin, Lamictal, and Prozac are effective and denies SE.   Suicidal Ideation: No Plan Formed: No Patient has means to carry out plan: No  Homicidal Ideation: No Plan Formed: No Patient has means to carry out plan: No  Review of Systems: Psychiatric: Agitation: No Hallucination: No Depressed Mood: Yes Insomnia: No Hypersomnia: Yes Altered Concentration: Yes but she takes 5 hr energy and it helps. Denies SE. Feels Worthless: No Grandiose Ideas: Yes last time was several weeks and she feels happy and makes lists and leaving the apartment to do things. Doesn't spend money and makes a lot plans but does nothing. Lasts for one day. Denies impulsivity and financial extravagance.  Belief In Special Powers: No New/Increased Substance Abuse: No Compulsions: No  Review of Systems  Constitutional: Positive for malaise/fatigue. Negative for fever and chills.  HENT: Negative for congestion, ear pain and sore throat.   Eyes: Negative for blurred vision and redness.  Respiratory: Negative for cough and sputum production.   Cardiovascular: Negative for chest pain and palpitations.  Gastrointestinal: Positive for heartburn.  Musculoskeletal:  Positive for joint pain.  Skin: Negative for itching and rash.  Neurological: Positive for headaches. Negative for dizziness and seizures.  Psychiatric/Behavioral: Positive for depression. Negative for suicidal ideas, hallucinations and substance abuse. The patient is nervous/anxious. The patient does not have insomnia.    Neurologic: Headache: Yes Seizure: No Paresthesias: No  Past Medical Family, Social History: Pt is divorced and living in [place]. She has one daughter.  reports that she has never smoked. She has never used smokeless tobacco. She reports that she does not drink alcohol or use illicit drugs.  Family History  Problem Relation Age of Onset  . Allergies Father   . Skin cancer Father   . Bipolar disorder Father   . Alcohol abuse Father   . Allergies Sister     multiple  . Allergies Brother     multiple  . Allergies Daughter   . Heart disease Mother   . Depression Mother   . Hypertension Mother   . CAD Mother   . Heart disease Maternal Grandfather   . Colon cancer Paternal Grandmother   . OCD Paternal Grandmother   . Cancer Paternal Grandfather    Past Medical History  Diagnosis Date  . Abscess of Bartholin's gland   . History of pleural effusion   . History of attention deficit disorder   . History of endometriosis   . RLS (restless legs syndrome)   . Anxiety   . Obesity   . GERD (gastroesophageal reflux disease)   . Renal mass 02/15/2012  . Depression     Bipolar disorder/goes to [place] [facility] for meds  . OSA (obstructive  sleep apnea)     uses oral appliance instead of CPAP  . Vertigo   . Chronic kidney disease     kidney cancer- pt states she has elected to not have it treated.  Marland Kitchen PONV (postoperative nausea and vomiting)   . Difficult intubation     told by [facility] that she was hard to intubate 15b yrs ago in [place]- surgery since then no problems  . Adverse effect of general anesthetic     felt paralyzed while receiving anesthesia  . Bipolar 1  disorder   . PTSD (post-traumatic stress disorder)   . ADHD (attention deficit hyperactivity disorder)   . Migraines   . rt renal ca dx'd 12/2009    no treatment/ no surg  . Acute vestibular neuronitis     Dr Lucia Gaskins  . RLS (restless legs syndrome)     Dr Gwenette Greet  . H/O echocardiogram 07/2012    Normla LVF w grade I siastolic dysfunction   . PVC's (premature ventricular contractions)   . Hypertension   . 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 Dysrhythmia     h/o atrial tachycardia- s/p ablation      Outpatient Encounter Prescriptions as of 01/01/2014  Medication Sig  . aspirin 81 MG EC tablet Take 1 tablet (81 mg total) by mouth daily. For heart health  . buPROPion (WELLBUTRIN XL) 300 MG 24 hr tablet Take 1 tablet (300 mg total) by mouth daily. For depression  . clonazePAM (KLONOPIN) 0.5 MG tablet Take 1 tablet (0.5 mg total) by mouth 2 (two) times daily.  Marland Kitchen FLUoxetine (PROZAC) 20 MG tablet Take 1 tablet (20 mg total) by mouth daily.  Marland Kitchen lamoTRIgine (LAMICTAL) 200 MG tablet Take 1 tablet (200 mg total) by mouth daily.  Marland Kitchen levothyroxine (SYNTHROID, LEVOTHROID) 88 MCG tablet Take 1 tablet (88 mcg total) by mouth daily before breakfast. For low thyroid function  . nebivolol (BYSTOLIC) 5 MG tablet Take 0.5 tablets (2.5 mg total) by mouth daily.  . ondansetron (ZOFRAN) 4 MG tablet   . pantoprazole (PROTONIX) 40 MG tablet Take 1 tablet (40 mg total) by mouth daily. For acid reflux  . rOPINIRole (REQUIP) 0.5 MG tablet Take 1 late afternoon and 2 at bedtime  . rOPINIRole (REQUIP) 0.5 MG tablet TAKE 1 TABLET BY MOUTH LATE IN THE AFTERNOON AND 2 TABS AFTER DINNER  . traMADol (ULTRAM) 50 MG tablet   . valsartan-hydrochlorothiazide (DIOVAN-HCT) 320-25 MG per tablet Take 1 tablet by mouth daily. High blood pressure control    Past Psychiatric History/Hospitalization(s): Anxiety: Yes Bipolar Disorder: Yes Depression: Yes Mania: Yes Psychosis:  No Schizophrenia: No Personality Disorder: No Hospitalization for psychiatric illness: Yes History of Electroconvulsive Shock Therapy: No Prior Suicide Attempts: No  Physical Exam: Constitutional:  BP 132/95  Pulse 75  General Appearance: alert, oriented, no acute distress  Musculoskeletal: Strength & Muscle Tone: within normal limits Gait & Station: normal Patient leans: N/A  Mental Status Examination/Evaluation: Objective: Attitude: Calm and cooperative  Appearance: Fairly Groomed, appears to be stated age  Engineer, water::  Fair  Speech:  Clear and Coherent and Normal Rate  Volume:  Normal  Mood:  depressed  Affect:  Congruent  Thought Process:  Intact, Linear and Logical  Orientation:  Full (Time, Place, and Person)  Thought Content:  Negative  Suicidal Thoughts:  No  Homicidal Thoughts:  No  Judgement:  Fair  Insight:  Fair  Concentration: good  Memory: Immediate-intact Recent-intact Remote-intact  Recall: fair  Language: fair  Gait and Station: normal  [employer] of Knowledge: average  Psychomotor Activity:  Normal  Akathisia:  No  Handed:  Right  AIMS (if indicated):  n/a      Medical Decision Making (Choose Three): Review of Psycho-Social Stressors (1), Review or order clinical lab tests (1), Established Problem, Worsening (2), Review of Medication Regimen & Side Effects (2) and Review of New Medication or Change in Dosage (2)  Assessment: Axis I: Bipolar d/o- current episode MDD- moderate, recurrent;  PTSD Axis II: deferred Axis III:  Past Medical History  Diagnosis Date  . Abscess of Bartholin's gland   . History of pleural effusion   . History of attention deficit disorder   . History of endometriosis   . RLS (restless legs syndrome)   . Anxiety   . Obesity   . GERD (gastroesophageal reflux disease)   . Renal mass 02/15/2012  . Depression     Bipolar disorder/goes to [place] [facility] for meds  . OSA (obstructive sleep apnea)     uses oral  appliance instead of CPAP  . Vertigo   . Chronic kidney disease     kidney cancer- pt states she has elected to not have it treated.  Marland Kitchen PONV (postoperative nausea and vomiting)   . Difficult intubation     told by [facility] that she was hard to intubate 15b yrs ago in [place]- surgery since then no problems  . Adverse effect of general anesthetic     felt paralyzed while receiving anesthesia  . Bipolar 1 disorder   . PTSD (post-traumatic stress disorder)   . ADHD (attention deficit hyperactivity disorder)   . Migraines   . rt renal ca dx'd 12/2009    no treatment/ no surg  . Acute vestibular neuronitis     Dr Lucia Gaskins  . RLS (restless legs syndrome)     Dr Gwenette Greet  . H/O echocardiogram 07/2012    Normla LVF w grade I siastolic dysfunction   . PVC's (premature ventricular contractions)   . Hypertension   . 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 Dysrhythmia     h/o atrial tachycardia- s/p ablation    Axis IV: poor coping skills  Axis V: GAF 60   Plan: D/c Klonopin  Restart Ativan 1mg  qD prn anxiety Increase Lamictal 200mg  po qD (Pt was only taking 150mg  previously) for mood stabalization Continue Wellbutrin XL 300mg  po qD for depression Continue Prozac 20mg  po qD for depression and anxiety and PTSD  Medication management with supportive therapy. Risks/benefits and SE of the medication discussed. Pt verbalized understanding and verbal consent obtained for treatment.  Affirm with the patient that the medications are taken as ordered. Patient expressed understanding of how their medications were to be used.  -worsening of symptoms  Recommended pt obtain light box or sitting in direct sunlight for 1-2 hrs/day  Reviewed labs from 08/01/2013: CBC WNL, CMP WNL,     Therapy: brief supportive therapy provided. Discussed psychosocial stressors in detail.    Pt denies SI and is at an acute low risk for suicide.Patient told to call clinic if any problems occur.  Patient advised to go to ER if they should develop SI/HI, side effects, or if symptoms worsen. Has crisis numbers to call if needed. Pt verbalized understanding.  F/up in 2 months or sooner if needed  [name], MD 01/01/2014

## 2014-01-02 NOTE — Telephone Encounter (Signed)
Pt came into office 01-01-14 and per notes on file, pt was d/c from Klonopin due to change in therapy by Dr. Doyne Keel.

## 2014-01-25 ENCOUNTER — Ambulatory Visit (INDEPENDENT_AMBULATORY_CARE_PROVIDER_SITE_OTHER): Payer: 59 | Admitting: Psychiatry

## 2014-01-25 DIAGNOSIS — F3130 Bipolar disorder, current episode depressed, mild or moderate severity, unspecified: Secondary | ICD-10-CM

## 2014-01-25 DIAGNOSIS — F4310 Post-traumatic stress disorder, unspecified: Secondary | ICD-10-CM
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
## 2014-01-29 ENCOUNTER — Ambulatory Visit (INDEPENDENT_AMBULATORY_CARE_PROVIDER_SITE_OTHER): Payer: 59 | Admitting: Psychiatry

## 2014-01-29 DIAGNOSIS — F3130 Bipolar disorder, current episode depressed, mild or moderate severity, unspecified: Secondary | ICD-10-CM

## 2014-01-29 DIAGNOSIS — F4310 Post-traumatic stress disorder, unspecified: Secondary | ICD-10-CM
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
## 2014-02-07 ENCOUNTER — Other Ambulatory Visit (HOSPITAL_BASED_OUTPATIENT_CLINIC_OR_DEPARTMENT_OTHER): Payer: PRIVATE HEALTH INSURANCE

## 2014-02-07 ENCOUNTER — Telehealth: Payer: Self-pay | Admitting: Oncology

## 2014-02-07 ENCOUNTER — Ambulatory Visit (HOSPITAL_BASED_OUTPATIENT_CLINIC_OR_DEPARTMENT_OTHER): Payer: PRIVATE HEALTH INSURANCE | Admitting: Oncology

## 2014-02-07 VITALS — BP 148/82 | HR 71 | Temp 98.7°F | Resp 19 | Ht 65.0 in | Wt 226.2 lb

## 2014-02-07 DIAGNOSIS — F329 Major depressive disorder, single episode, unspecified: Secondary | ICD-10-CM

## 2014-02-07 DIAGNOSIS — N2889 Other specified disorders of kidney and ureter: Secondary | ICD-10-CM

## 2014-02-07 LAB — CBC WITH DIFFERENTIAL/PLATELET
BASO%: 0.4 % (ref 0.0–2.0)
Basophils Absolute: 0 10*3/uL (ref 0.0–0.1)
EOS%: 4.5 % (ref 0.0–7.0)
Eosinophils Absolute: 0.2 10*3/uL (ref 0.0–0.5)
HCT: 39.5 % (ref 34.8–46.6)
HGB: 12.8 g/dL (ref 11.6–15.9)
LYMPH%: 27.6 % (ref 14.0–49.7)
MCH: 27.7 pg (ref 25.1–34.0)
MCHC: 32.4 g/dL (ref 31.5–36.0)
MCV: 85.5 fL (ref 79.5–101.0)
MONO#: 0.3 10*3/uL (ref 0.1–0.9)
MONO%: 6.7 % (ref 0.0–14.0)
NEUT#: 3 10*3/uL (ref 1.5–6.5)
NEUT%: 60.8 % (ref 38.4–76.8)
Platelets: 219 10*3/uL (ref 145–400)
RBC: 4.62 10*6/uL (ref 3.70–5.45)
RDW: 14.6 % — ABNORMAL HIGH (ref 11.2–14.5)
WBC: 4.9 10*3/uL (ref 3.9–10.3)
lymph#: 1.4 10*3/uL (ref 0.9–3.3)

## 2014-02-07 LAB — COMPREHENSIVE METABOLIC PANEL (CC13)
ALT: 17 U/L (ref 0–55)
AST: 19 U/L (ref 5–34)
Albumin: 3.8 g/dL (ref 3.5–5.0)
Alkaline Phosphatase: 92 U/L (ref 40–150)
Anion Gap: 8 mEq/L (ref 3–11)
BUN: 16.4 mg/dL (ref 7.0–26.0)
CO2: 26 mEq/L (ref 22–29)
Calcium: 9.9 mg/dL (ref 8.4–10.4)
Chloride: 106 mEq/L (ref 98–109)
Creatinine: 0.8 mg/dL (ref 0.6–1.1)
Glucose: 97 mg/dl (ref 70–140)
Potassium: 3.9 mEq/L (ref 3.5–5.1)
Sodium: 141 mEq/L (ref 136–145)
Total Bilirubin: 0.38 mg/dL (ref 0.20–1.20)
Total Protein: 7.2 g/dL (ref 6.4–8.3)

## 2014-02-07 NOTE — Progress Notes (Signed)
Hematology and Oncology Follow Up Visit  CAROL THEYS [id number] [birth year] 68 y.o. 02/07/2014 2:44 PM  CC: Antony Haste (C.Alan) Harrington Challenger, M.D.    Principle Diagnosis: This is a 68 year old female with a renal mass, presented with 4.6 x 4.4 x 3.1 cm right renal mass suspicious for a neoplasm.  The patient refused a biopsy or primary treatment. Diagnosed in 2011.   Current therapy: Observation and surveillance. Followup CT scan in November of 2014 continue to show stable disease.  Interim History:  Ms. Soule presents today for a followup visit.  Since the last time I saw her, she has been doing well. She did not report any hematuria or dysuria. She has not reported any back pain.  She does report some occasional back discomfort that have resolved now. It is not radiating. It is not associated with any neurological deficits.  Performance status and activity level remain reasonable. She is reporting that her depression is much improved at this time and her mood is excellent. She feels that she is in a better place emotionally and mentally compare to previous years.. She is following up with psychiatry and continued to have intermittent counseling. She had not had any fevers or chills or sweats. Has not had any abdominal pain or change in her bowel habits. She does report nausea but no vomiting. Rest of her review of systems unremarkable.  Medications: I have reviewed the patient's current medications. Current Outpatient Prescriptions  Medication Sig Dispense Refill  . aspirin 81 MG EC tablet Take 1 tablet (81 mg total) by mouth daily. For heart health 30 tablet 1  . buPROPion (WELLBUTRIN XL) 300 MG 24 hr tablet Take 1 tablet (300 mg total) by mouth daily. For depression 30 tablet 1  . butalbital-acetaminophen-caffeine (FIORICET, ESGIC) 50-325-40 MG per tablet Take 1 tablet by mouth every 6 (six) hours as needed.  0  . clonazePAM (KLONOPIN) 0.5 MG tablet Take 1 mg by mouth daily.  0  . FLUoxetine  (PROZAC) 20 MG tablet Take 1 tablet (20 mg total) by mouth daily. 30 tablet 1  . lamoTRIgine (LAMICTAL) 200 MG tablet Take 1 tablet (200 mg total) by mouth daily. 30 tablet 1  . levothyroxine (SYNTHROID, LEVOTHROID) 88 MCG tablet Take 1 tablet (88 mcg total) by mouth daily before breakfast. For low thyroid function    . LORazepam (ATIVAN) 1 MG tablet Take 1 tablet (1 mg total) by mouth 2 (two) times daily. 60 tablet 1  . nebivolol (BYSTOLIC) 5 MG tablet Take 0.5 tablets (2.5 mg total) by mouth daily. 45 tablet 1  . promethazine (PHENERGAN) 25 MG tablet Take 25 mg by mouth every 6 (six) hours as needed. for nausea  0  . rOPINIRole (REQUIP) 0.5 MG tablet Take 1 late afternoon and 2 at bedtime    . rOPINIRole (REQUIP) 0.5 MG tablet TAKE 1 TABLET BY MOUTH LATE IN THE AFTERNOON AND 2 TABS AFTER DINNER 90 tablet 3  . traMADol (ULTRAM) 50 MG tablet     . valsartan-hydrochlorothiazide (DIOVAN-HCT) 320-25 MG per tablet Take 1 tablet by mouth daily. High blood pressure control 30 tablet 0   No current facility-administered medications for this visit.    Allergies:  Allergies  Allergen Reactions  . Abilify [Aripiprazole] Other (See Comments)    jerking  . Compazine [Prochlorperazine Edisylate] Nausea And Vomiting  . Diflucan [Fluconazole] Nausea Only    HA  . Geodon [Ziprasidone Hydrochloride] Other (See Comments)    Extremely aggitated  . Lithium Nausea Only  and Other (See Comments)    Off balance, increased heart rate  . Talwin [Pentazocine] Other (See Comments)    Chest pain  . Toradol [Ketorolac Tromethamine] Other (See Comments)    hallucinations      Physical Exam: Blood pressure 148/82, pulse 71, temperature 98.7 F (37.1 C), temperature source Oral, resp. rate 19, height 5\' 5"  (1.651 m), weight 226 lb 3.2 oz (102.604 kg), SpO2 100 %. ECOG: 1 General appearance: alert and awake not in any distress. Head: Normocephalic, without obvious abnormality, atraumatic Neck: no adenopathy,  no masses. Lymph nodes: Cervical, supraclavicular, and axillary nodes normal. Heart:regular rate and rhythm, S1, S2 normal, no murmur, click, rub or gallop Lung:chest clear, no wheezing, rales, normal symmetric air entry Abdomen: soft, non-tender, without masses or organomegaly EXT:no erythema, induration, or nodules   Lab Results: Lab Results  Component Value Date   WBC 4.9 02/07/2014   HGB 12.8 02/07/2014   HCT 39.5 02/07/2014   MCV 85.5 02/07/2014   PLT 219 02/07/2014     Chemistry      Component Value Date/Time   NA 141 02/07/2014 1401   NA 138 04/26/2013 1715   NA 143 06/02/2011 1008   K 3.9 02/07/2014 1401   K 4.1 04/26/2013 1715   K 3.9 06/02/2011 1008   CL 97 04/26/2013 1715   CL 103 07/21/2012 1415   CL 95* 06/02/2011 1008   CO2 26 02/07/2014 1401   CO2 27 04/26/2013 1715   CO2 29 06/02/2011 1008   BUN 16.4 02/07/2014 1401   BUN 16 04/26/2013 1715   BUN 14 06/02/2011 1008   CREATININE 0.8 02/07/2014 1401   CREATININE 0.79 04/26/2013 1715   CREATININE 0.8 06/02/2011 1008      Component Value Date/Time   CALCIUM 9.9 02/07/2014 1401   CALCIUM 10.8* 04/26/2013 1715   CALCIUM 9.4 06/02/2011 1008   ALKPHOS 92 02/07/2014 1401   ALKPHOS 105 06/29/2012 1030   ALKPHOS 90* 06/02/2011 1008   AST 19 02/07/2014 1401   AST 25 06/29/2012 1030   AST 25 06/02/2011 1008   ALT 17 02/07/2014 1401   ALT 21 06/29/2012 1030   ALT 27 06/02/2011 1008   BILITOT 0.38 02/07/2014 1401   BILITOT 0.3 06/29/2012 1030   BILITOT 0.70 06/02/2011 1008        Impression and Plan:   68 year old female with the following issues:  1. A renal mass on the right side very suspicious for malignancy.  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 She provided me records today dating back to 2003 with a neoplasm in the kidney being present. At this point, this appears to be less risk of metastasis but it's very possible. She continues to  refuse surgical options despite improvement in her mood. The plan is to continue with active surveillance and repeat imaging studies as needed. 2. Depression.  This seems to be much improved at this time. 3. Followup will be in 12 months' time.     Gracie Gupta 11/19/20152:44 PM

## 2014-02-07 NOTE — Telephone Encounter (Signed)
Gave avs & cal for Nov 2016. °

## 2014-02-21 ENCOUNTER — Telehealth: Payer: Self-pay | Admitting: Pulmonary Disease

## 2014-02-21 MED ORDER — ROPINIROLE HCL 0.5 MG PO TABS: ORAL_TABLET | ORAL | Status: DC

## 2014-02-21 NOTE — Telephone Encounter (Signed)
Called spoke with pt. She is aware RX sent in. Nothing further needed

## 2014-03-05 ENCOUNTER — Ambulatory Visit (HOSPITAL_COMMUNITY): Payer: Self-pay | Admitting: Psychiatry

## 2014-03-07 ENCOUNTER — Ambulatory Visit (HOSPITAL_COMMUNITY): Payer: Self-pay | Admitting: Psychiatry

## 2014-03-08 ENCOUNTER — Ambulatory Visit (HOSPITAL_COMMUNITY): Payer: 59 | Admitting: Psychiatry

## 2014-03-08 DIAGNOSIS — F3130 Bipolar disorder, current episode depressed, mild or moderate severity, unspecified: Secondary | ICD-10-CM

## 2014-03-12 NOTE — Progress Notes (Signed)
   THERAPIST PROGRESS NOTE  Session Time: 3:00-4:00pm   Participation Level: Active   Behavioral Response: CasualAlertEuthymic  Type of Therapy: Individual Therapy   Treatment Goals addressed: Anxiety   Interventions: CBT   Summary: [name] is a 68 y.o. female who presents with anxiety and depression.   Suicidal/Homicidal: Nowithout intent/plan   Therapist Response:  Pt. Presented with bright mood, energetic. Pt. Discussed significant relationships with her sister, close friend that she met in a depression support group years ago who has become emotionally draining, romantic relationship with man who is emotionally unavailable because of untreated mental illness, and church bible study group that has been challenge to create meaningful relationships. Pt. Was encouraged to sit with the pain of her disconnection and to think about how she is contributing to the losses that she feels in these relationships. Pt. Discussed meeting with her church pastor and seeking out ministries for others who are coping with loneliness and disconnection.   Plan: Pt. To continue with CBT based therapy. Return again in 2-4 weeks.   Diagnosis: Axis I: Depressive Disorder NOS     Nancie Neas, [facility] 03/12/2014

## 2014-03-16 ENCOUNTER — Other Ambulatory Visit: Payer: Self-pay | Admitting: Pulmonary Disease

## 2014-03-19 ENCOUNTER — Ambulatory Visit (INDEPENDENT_AMBULATORY_CARE_PROVIDER_SITE_OTHER): Payer: 59 | Admitting: Psychiatry

## 2014-03-19 DIAGNOSIS — F3130 Bipolar disorder, current episode depressed, mild or moderate severity, unspecified: Secondary | ICD-10-CM
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
## 2014-03-21 ENCOUNTER — Ambulatory Visit (HOSPITAL_COMMUNITY): Payer: Self-pay | Admitting: Psychiatry

## 2014-03-26 ENCOUNTER — Ambulatory Visit (HOSPITAL_COMMUNITY): Payer: Self-pay | Admitting: Psychiatry

## 2014-03-28 ENCOUNTER — Ambulatory Visit (INDEPENDENT_AMBULATORY_CARE_PROVIDER_SITE_OTHER): Payer: 59 | Admitting: Psychiatry

## 2014-03-28 ENCOUNTER — Encounter (HOSPITAL_COMMUNITY): Payer: Self-pay | Admitting: Psychiatry

## 2014-03-28 VITALS — BP 130/71 | HR 69 | Ht 64.0 in | Wt 222.6 lb

## 2014-03-28 DIAGNOSIS — F4310 Post-traumatic stress disorder, unspecified: Secondary | ICD-10-CM

## 2014-03-28 DIAGNOSIS — F3130 Bipolar disorder, current episode depressed, mild or moderate severity, unspecified: Secondary | ICD-10-CM

## 2014-03-28 MED ORDER — FLUOXETINE HCL 20 MG PO TABS: 20.0000 mg | ORAL_TABLET | Freq: Every day | ORAL | Status: DC

## 2014-03-28 MED ORDER — LAMOTRIGINE 200 MG PO TABS: 200.0000 mg | ORAL_TABLET | Freq: Every day | ORAL | Status: DC

## 2014-03-28 MED ORDER — LORAZEPAM 1 MG PO TABS: 1.0000 mg | ORAL_TABLET | Freq: Two times a day (BID) | ORAL | Status: DC | PRN

## 2014-03-28 MED ORDER — BUPROPION HCL ER (XL) 300 MG PO TB24: 300.0000 mg | ORAL_TABLET | Freq: Every day | ORAL | Status: DC

## 2014-03-28 NOTE — Progress Notes (Signed)
[facility] [phone] Progress Note  [name] [id number] 69 y.o.  03/28/2014 3:56 PM  Chief Complaint: "overall doing well"  History of Present Illness: Little by little her depression has improved. Her light spectrum box broke but she is still doing well. States she is albe to think and ask questions appropriately. Denies sad mood and crying spells. Reports she is isolating and has some anhedonia and low motivation. Depression level is 4/10. Pt attributes change to her Prozac.   States winters are the worse for her. It is getting darker earlier and all she wanted to do is sleep. In the past she has used a full spectrum light box and it has helped.   Sleep is poor due to sleep apnea. Appetite is ok. Energy is low and she naps a lot. Memory seems to be poor lately. She takes 5 energy stuff and it helps her think clearly. Concentration improves dramatically with 5 hr. Energy.  Denies manic and hypomanic symptoms including periods of decreased need for sleep, increased energy, mood lability, impulsivity, FOI, and excessive spending.  PTSD- denies any concerns. Denies HV and startle reaction. Triggers cause intrusive memories but otherwise not. She does experience a lot of anger towards her father.   Pt is taking daily and it calms her. Anxiety is much better with Bystolic.   Wellbutrin, Lamictal, and Prozac are effective and denies SE.   Pt is planning on moving to a less expensive apartment in the near future.   Suicidal Ideation: No Plan Formed: No Patient has means to carry out plan: No  Homicidal Ideation: No Plan Formed: No Patient has means to carry out plan: No  Review of Systems: Psychiatric: Agitation: No Hallucination: No Depressed Mood: Yes Insomnia: No Hypersomnia: Yes Altered Concentration: Yes but she takes 5 hr energy and it helps. Denies SE. Feels Worthless: No Grandiose Ideas: No Belief In Special Powers: No New/Increased Substance Abuse:  No Compulsions: No  Review of Systems  Constitutional: Positive for malaise/fatigue. Negative for fever and chills.  HENT: Negative for congestion, ear pain and sore throat.   Eyes: Negative for blurred vision, double vision and redness.  Respiratory: Negative for cough, sputum production and shortness of breath.   Cardiovascular: Negative for chest pain, palpitations and leg swelling.  Gastrointestinal: Positive for heartburn. Negative for nausea, vomiting and abdominal pain.  Musculoskeletal: Positive for back pain, joint pain and neck pain.  Skin: Negative for itching and rash.  Neurological: Positive for headaches. Negative for dizziness, tingling and seizures.  Psychiatric/Behavioral: Positive for depression. Negative for suicidal ideas, hallucinations and substance abuse. The patient is not nervous/anxious and does not have insomnia.    Neurologic: Headache: Yes Seizure: No Paresthesias: No  Past Medical Family, Social History: Pt is divorced and living in [place]. Pt lives alone. Unemployed. She has one daughter.  reports that she has never smoked. She has never used smokeless tobacco. She reports that she does not drink alcohol or use illicit drugs.  Family History  Problem Relation Age of Onset  . Allergies Father   . Skin cancer Father   . Bipolar disorder Father   . Alcohol abuse Father   . Allergies Sister     multiple  . Allergies Brother     multiple  . Allergies Daughter   . Heart disease Mother   . Depression Mother   . Hypertension Mother   . CAD Mother   . Heart disease Maternal Grandfather   . Colon cancer Paternal  Grandmother   . OCD Paternal Grandmother   . Cancer Paternal Grandfather    Past Medical History  Diagnosis Date  . Abscess of Bartholin's gland   . History of pleural effusion   . History of attention deficit disorder   . History of endometriosis   . RLS (restless legs syndrome)   . Anxiety   . Obesity   . GERD (gastroesophageal  reflux disease)   . Renal mass 02/15/2012  . Depression     Bipolar disorder/goes to [facility] for meds  . OSA (obstructive sleep apnea)     uses oral appliance instead of CPAP  . Vertigo   . Chronic kidney disease     kidney cancer- pt states she has elected to not have it treated.  Marland Kitchen PONV (postoperative nausea and vomiting)   . Difficult intubation     told by [employer] that she was hard to intubate 15b yrs ago in [place]- surgery since then no problems  . Adverse effect of general anesthetic     felt paralyzed while receiving anesthesia  . Bipolar 1 disorder   . PTSD (post-traumatic stress disorder)   . ADHD (attention deficit hyperactivity disorder)   . Migraines   . rt renal ca dx'd 12/2009    no treatment/ no surg  . Acute vestibular neuronitis     Dr Lucia Gaskins  . RLS (restless legs syndrome)     Dr Gwenette Greet  . H/O echocardiogram 07/2012    Normla LVF w grade I siastolic dysfunction   . PVC's (premature ventricular contractions)   . Hypertension   . 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 Dysrhythmia     h/o atrial tachycardia- s/p ablation      Outpatient Encounter Prescriptions as of 03/28/2014  Medication Sig  . buPROPion (WELLBUTRIN XL) 300 MG 24 hr tablet Take 1 tablet (300 mg total) by mouth daily. For depression  . FLUoxetine (PROZAC) 20 MG tablet Take 1 tablet (20 mg total) by mouth daily.  Marland Kitchen lamoTRIgine (LAMICTAL) 200 MG tablet Take 1 tablet (200 mg total) by mouth daily.  Marland Kitchen levothyroxine (SYNTHROID, LEVOTHROID) 88 MCG tablet Take 1 tablet (88 mcg total) by mouth daily before breakfast. For low thyroid function  . LORazepam (ATIVAN) 1 MG tablet Take 1 tablet (1 mg total) by mouth 2 (two) times daily.  . nebivolol (BYSTOLIC) 5 MG tablet Take 0.5 tablets (2.5 mg total) by mouth daily.  Marland Kitchen rOPINIRole (REQUIP) 0.5 MG tablet Take 1 late afternoon and 2 at bedtime  . rOPINIRole (REQUIP) 0.5 MG tablet TAKE 1 TABLET BY MOUTH LATE IN THE AFTERNOON  AND 2 TABS AFTER DINNER  . valsartan-hydrochlorothiazide (DIOVAN-HCT) 320-25 MG per tablet Take 1 tablet by mouth daily. High blood pressure control  . aspirin 81 MG EC tablet Take 1 tablet (81 mg total) by mouth daily. For heart health (Patient not taking: Reported on 03/28/2014)  . butalbital-acetaminophen-caffeine (FIORICET, ESGIC) 50-325-40 MG per tablet Take 1 tablet by mouth every 6 (six) hours as needed.  . clonazePAM (KLONOPIN) 0.5 MG tablet Take 1 mg by mouth daily.  . promethazine (PHENERGAN) 25 MG tablet Take 25 mg by mouth every 6 (six) hours as needed. for nausea  . traMADol (ULTRAM) 50 MG tablet     Past Psychiatric History/Hospitalization(s): Anxiety: Yes Bipolar Disorder: Yes Depression: Yes Mania: Yes Psychosis: No Schizophrenia: No Personality Disorder: No Hospitalization for psychiatric illness: Yes History of Electroconvulsive Shock Therapy: No Prior Suicide Attempts:  No  Physical Exam: Constitutional:  BP 130/71 mmHg  Pulse 69  Ht 5\' 4"  (1.626 m)  Wt 222 lb 9.6 oz (100.971 kg)  BMI 38.19 kg/m2  General Appearance: alert, oriented, no acute distress  Musculoskeletal: Strength & Muscle Tone: within normal limits Gait & Station: normal Patient leans: N/A  Mental Status Examination/Evaluation: Objective: Attitude: Calm and cooperative  Appearance: Fairly Groomed, appears to be stated age  Engineer, water::  Fair  Speech:  Clear and Coherent and Normal Rate  Volume:  Normal  Mood:  depressed  Affect:  Full Range  Thought Process:  Intact, Linear and Logical  Orientation:  Full (Time, Place, and Person)  Thought Content:  Negative  Suicidal Thoughts:  No  Homicidal Thoughts:  No  Judgement:  Fair  Insight:  Fair  Concentration: good  Memory: Immediate-intact Recent-intact Remote-intact  Recall: fair  Language: fair  Gait and Station: normal  [employer] of Knowledge: average  Psychomotor Activity:  Normal  Akathisia:  No  Handed:  Right  AIMS  (if indicated):  n/a      Medical Decision Making (Choose Three): Established Problem, Stable/Improving (1), Review of Psycho-Social Stressors (1), Review or order clinical lab tests (1) and Review of Medication Regimen & Side Effects (2)  Assessment: Axis I: Bipolar d/o- current episode depressed;  PTSD Axis II: deferred Axis III:  Past Medical History  Diagnosis Date  . Abscess of Bartholin's gland   . History of pleural effusion   . History of attention deficit disorder   . History of endometriosis   . RLS (restless legs syndrome)   . Anxiety   . Obesity   . GERD (gastroesophageal reflux disease)   . Renal mass 02/15/2012  . Depression     Bipolar disorder/goes to [facility] for meds  . OSA (obstructive sleep apnea)     uses oral appliance instead of CPAP  . Vertigo   . Chronic kidney disease     kidney cancer- pt states she has elected to not have it treated.  Marland Kitchen PONV (postoperative nausea and vomiting)   . Difficult intubation     told by [employer] that she was hard to intubate 15b yrs ago in [place]- surgery since then no problems  . Adverse effect of general anesthetic     felt paralyzed while receiving anesthesia  . Bipolar 1 disorder   . PTSD (post-traumatic stress disorder)   . ADHD (attention deficit hyperactivity disorder)   . Migraines   . rt renal ca dx'd 12/2009    no treatment/ no surg  . Acute vestibular neuronitis     Dr Lucia Gaskins  . RLS (restless legs syndrome)     Dr Gwenette Greet  . H/O echocardiogram 07/2012    Normla LVF w grade I siastolic dysfunction   . PVC's (premature ventricular contractions)   . Hypertension   . 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 Dysrhythmia     h/o atrial tachycardia- s/p ablation    Axis IV: poor coping skills  Axis V: GAF 60   Plan:  Restart Ativan 1mg  BID prn anxiety Lamictal 200mg  po qD (Pt was only taking 150mg  previously) for mood stabalization Continue Wellbutrin XL 300mg  po qD for  depression Continue Prozac 20mg  po qD for depression and anxiety and PTSD  Medication management with supportive therapy. Risks/benefits and SE of the medication discussed. Pt verbalized understanding and verbal consent obtained for treatment.  Affirm with the patient that  the medications are taken as ordered. Patient expressed understanding of how their medications were to be used.  -improvement of symptoms  Recommended pt obtain light box or sitting in direct sunlight for 1-2 hrs/day  Reviewed labs from 02/07/2014: CBC WNL, CMP WNL,     Therapy: brief supportive therapy provided. Discussed psychosocial stressors in detail.    Pt denies SI and is at an acute low risk for suicide.Patient told to call clinic if any problems occur. Patient advised to go to ER if they should develop SI/HI, side effects, or if symptoms worsen. Has crisis numbers to call if needed. Pt verbalized understanding.  F/up in 3 months or sooner if needed  [name], MD 03/28/2014

## 2014-04-01 ENCOUNTER — Other Ambulatory Visit: Payer: Self-pay | Admitting: Cardiology

## 2014-04-11 ENCOUNTER — Other Ambulatory Visit: Payer: Self-pay | Admitting: Oral Surgery

## 2014-04-11 DIAGNOSIS — R6884 Jaw pain: Secondary | ICD-10-CM

## 2014-04-21 ENCOUNTER — Other Ambulatory Visit: Payer: Self-pay

## 2014-04-24 ENCOUNTER — Encounter: Payer: Self-pay | Admitting: Cardiology

## 2014-04-30 ENCOUNTER — Other Ambulatory Visit: Payer: Self-pay

## 2014-05-11 ENCOUNTER — Other Ambulatory Visit (HOSPITAL_COMMUNITY): Payer: Self-pay | Admitting: Psychiatry

## 2014-05-13 NOTE — Telephone Encounter (Signed)
Patient's Ativan refill request denied as patient was given a new printed prescription 03/28/14 with 2 refills.  Returns for next evaluation on 06/27/14.

## 2014-05-17 ENCOUNTER — Ambulatory Visit (INDEPENDENT_AMBULATORY_CARE_PROVIDER_SITE_OTHER): Payer: 59 | Admitting: Psychiatry

## 2014-05-17 DIAGNOSIS — F4310 Post-traumatic stress disorder, unspecified: Secondary | ICD-10-CM

## 2014-05-17 DIAGNOSIS — F3130 Bipolar disorder, current episode depressed, mild or moderate severity, unspecified: Secondary | ICD-10-CM

## 2014-05-20 NOTE — Progress Notes (Signed)
   THERAPIST PROGRESS NOTE  Session Time: 2:00-3:00pm   Participation Level: Active   Behavioral Response: CasualAlertEuthymic  Type of Therapy: Individual Therapy   Treatment Goals addressed: Anxiety   Interventions: CBT   Summary: [name] is a 69 y.o. female who presents with anxiety and depression.   Suicidal/Homicidal: Nowithout intent/plan   Therapist Response: Pt. Presented as energetic, smiled appropriately. Pt.discussed that she was able to move successfully with assistance from friends. Pt. Reported that she is getting settled in to her new apartment, but has had few problems with neighbors who have reported her to management for noise and too frequent overnight guest. Pt. Reports that her relationship with her SO is positive and supportive. Pt. Reports some concerns about 7 year age difference and the difference becoming more relevant as in the next few years as her aging progresses. Session focused on feelings of worthiness of love.  Plan: Pt. To continue with CBT based therapy. Return again in 2-4 weeks.   Diagnosis: Axis I: Depressive Disorder NOS    Nancie Neas, [facility] 05/20/2014

## 2014-05-21 ENCOUNTER — Inpatient Hospital Stay: Admission: RE | Admit: 2014-05-21 | Payer: Self-pay | Source: Ambulatory Visit

## 2014-05-24 ENCOUNTER — Ambulatory Visit (INDEPENDENT_AMBULATORY_CARE_PROVIDER_SITE_OTHER): Payer: 59 | Admitting: Psychiatry

## 2014-05-24 DIAGNOSIS — F3130 Bipolar disorder, current episode depressed, mild or moderate severity, unspecified: Secondary | ICD-10-CM

## 2014-05-24 DIAGNOSIS — F4310 Post-traumatic stress disorder, unspecified: Secondary | ICD-10-CM

## 2014-05-24 NOTE — Progress Notes (Signed)
   THERAPIST PROGRESS NOTE  Session Time: 1:00-2:00   Participation Level: Active   Behavioral Response: CasualAlertEuthymic  Type of Therapy: Individual Therapy   Treatment Goals addressed: Anxiety   Interventions: CBT   Summary: [name] is a 69 y.o. female who presents with anxiety and depression.   Suicidal/Homicidal: Nowithout intent/plan   Therapist Response: Pt. Continues to present with euthymic mood, energetic talkative. Pt. Continues to report that she is happy in committed relationship person who has been a friend for many years but demonstrates symptoms consistent with thought disorder including some paranoia and forgetfulness. Pt. Reports that she is going to [facility] next week and has some anxiety about driving the distance. Session focused on methods to manage anxiety and prepare for the trip such as using 4-7-8 breathing exercises, guided visualization, and stopping as needed. Pt. Discussed her partner's desire to go to [place] and anxiety about making the trip given the airplane travel and distance from home. Used time to discuss planning for the trip such as guided meditations, breathing and visualizations to prepare. Pt. Reported that she continues to make strides in making her home comfortable.  Plan: Pt. To continue with CBT based therapy. Return again in 2-4 weeks.   Diagnosis: Axis I: Depressive Disorder NOS     Nancie Neas, [facility] 05/24/2014

## 2014-05-31 ENCOUNTER — Ambulatory Visit (INDEPENDENT_AMBULATORY_CARE_PROVIDER_SITE_OTHER): Payer: 59 | Admitting: Psychiatry

## 2014-05-31 DIAGNOSIS — F3130 Bipolar disorder, current episode depressed, mild or moderate severity, unspecified: Secondary | ICD-10-CM

## 2014-05-31 DIAGNOSIS — F4310 Post-traumatic stress disorder, unspecified: Secondary | ICD-10-CM

## 2014-06-03 ENCOUNTER — Ambulatory Visit (HOSPITAL_COMMUNITY)
Admission: RE | Admit: 2014-06-03 | Discharge: 2014-06-03 | Disposition: A | Discharge status: Home / Self Care | Payer: Medicare Other | Source: Ambulatory Visit | Attending: Oral Surgery | Admitting: Oral Surgery

## 2014-06-03 ENCOUNTER — Other Ambulatory Visit: Payer: Self-pay | Admitting: Oral Surgery

## 2014-06-03 DIAGNOSIS — R6884 Jaw pain: Secondary | ICD-10-CM

## 2014-06-03 NOTE — Progress Notes (Signed)
   THERAPIST PROGRESS NOTE   Session Time: 3:00-4:00   Participation Level: Active   Behavioral Response: CasualAlertEuthymic  Type of Therapy: Individual Therapy   Treatment Goals addressed: Anxiety   Interventions: CBT   Summary: [name] is a 69 y.o. female who presents with anxiety and depression.   Suicidal/Homicidal: Nowithout intent/plan   Therapist Response: Pt. Continues to present with euthymic mood, energetic, and talkative. Pt. Reports that she did not travel to the beach with her boyfriend this week as expected, but was ok with the change of plans due to anxiety that she experiences while driving. Pt. Reports that she not felt like engaging in productive tasks. However, Pt. Reports that she is enjoying sleeping, sexual intimacy, public interest/political outings, frequent talks and laughs with her partner. Pt. Also reports that she enjoys decadent foods such as prime rib and milk shakes. Pt. Also reports that she feels that she is in a "honeymoon" phase in her current relationship and is greatly enjoying her life. Pt. Was encouraged to break down her tasks into small 10-20 minute chunks so that she can continue enjoying daily activities and not feel overwhelmed by her daily tasks.   Plan: Pt. To continue with CBT based therapy. Return again in 2-4 weeks.   Diagnosis: Axis I: Depressive Disorder NOS    Nancie Neas, [facility] 06/03/2014

## 2014-06-04 ENCOUNTER — Other Ambulatory Visit (HOSPITAL_COMMUNITY): Payer: Self-pay | Admitting: Psychiatry

## 2014-06-07 ENCOUNTER — Ambulatory Visit (INDEPENDENT_AMBULATORY_CARE_PROVIDER_SITE_OTHER): Payer: 59 | Admitting: Psychiatry

## 2014-06-07 DIAGNOSIS — F4310 Post-traumatic stress disorder, unspecified: Secondary | ICD-10-CM

## 2014-06-07 NOTE — Progress Notes (Signed)
   THERAPIST PROGRESS NOTE  Session Time: 2:00-3:00   Participation Level: Active   Behavioral Response: CasualAlertEuthymic  Type of Therapy: Individual Therapy   Treatment Goals addressed: Anxiety   Interventions: CBT   Summary: [name] is a 69 y.o. female who presents with anxiety and depression.   Suicidal/Homicidal: Nowithout intent/plan   Therapist Response: Pt. Continues to present with euthymic mood, energetic, and talkative. Pt. Continues to settle in to new apartment. Pt. Discussed legal concerns related to estate planning and referral to estate planning attorney was made. Significant part of session was spent discussing awareness of need for time alone and self-care from her boyfriend and how to develop healthy time and space boundaries with her boyfriend. Pt. Reports that her mood continues to be good and that she is looking forward to the weekend and the Easter holiday next week.   Plan: Pt. To continue with CBT based therapy. Return again in 1 week.   Diagnosis: Axis I: Depressive Disorder NOS    Nancie Neas, [facility] 06/07/2014

## 2014-06-13 ENCOUNTER — Other Ambulatory Visit (HOSPITAL_COMMUNITY): Payer: Self-pay | Admitting: Psychiatry

## 2014-06-14 ENCOUNTER — Ambulatory Visit (HOSPITAL_COMMUNITY): Payer: Self-pay | Admitting: Psychiatry

## 2014-06-20 ENCOUNTER — Other Ambulatory Visit (HOSPITAL_COMMUNITY): Payer: Self-pay | Admitting: Psychiatry

## 2014-06-21 ENCOUNTER — Ambulatory Visit (INDEPENDENT_AMBULATORY_CARE_PROVIDER_SITE_OTHER): Payer: Medicare Other | Admitting: Pulmonary Disease

## 2014-06-21 ENCOUNTER — Telehealth (HOSPITAL_COMMUNITY): Payer: Self-pay | Admitting: *Deleted

## 2014-06-21 ENCOUNTER — Encounter: Payer: Self-pay | Admitting: Pulmonary Disease

## 2014-06-21 VITALS — BP 132/76 | HR 62 | Temp 97.0°F | Ht 65.0 in | Wt 218.8 lb

## 2014-06-21 DIAGNOSIS — G2581 Restless legs syndrome: Secondary | ICD-10-CM | POA: Diagnosis not present

## 2014-06-21 DIAGNOSIS — G4733 Obstructive sleep apnea (adult) (pediatric): Secondary | ICD-10-CM | POA: Diagnosis not present

## 2014-06-21 DIAGNOSIS — F3130 Bipolar disorder, current episode depressed, mild or moderate severity, unspecified: Secondary | ICD-10-CM

## 2014-06-21 MED ORDER — FLUOXETINE HCL 20 MG PO CAPS: 20.0000 mg | ORAL_CAPSULE | Freq: Every day | ORAL | Status: DC

## 2014-06-21 NOTE — Telephone Encounter (Signed)
[employer] called back.  Doug with [facility] stated that they will cover the Fluoxetine capsules not tablets.  Patient has had capsules in the past.  I called Dr. Doyne Keel on her cell phone.  Per Dr. Doyne Keel capsules are fine but only give enough until patient's appointment on 06-27-14.   I called [facility] and spoke with Almyra Free. Almyra Free stated that patient was able to get the tablets for Fluoxetine until now, patient had change in insurance, only capsules will be covered. RX for Fluoxetine capsules went through, seven capsules will cost .28 cents. Almyra Free gave a heads up on the [place].  **Patient has not filled the Platinum yet.  **Per Donia Ast RX from December is still being used.

## 2014-06-21 NOTE — Assessment & Plan Note (Signed)
The patient has not been wearing her dental appliance because of recent dental issues. 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 I have asked her to discuss with her oral surgeon whether a different type of procedure may be able to treat her jaw issue and her sleep apnea. Finally, I have encouraged her to work aggressively on weight loss.

## 2014-06-21 NOTE — Patient Instructions (Signed)
Talk to your dental appliance person about refitting your appliance. Talk to your oral surgeon about your upcoming surgery, and if something can be done to treat your sleep apnea at the same time. Work on [employer] loss Continue on requip at your current dose (one in afternoon, and two at bedtime/after dinner).  If you take too much, can actually make your leg movements worse. Have your primary doctor check your iron panel with your upcoming bloodwork.  This can cause restless legs. Work on daily exercise.  This is a great way to treat your restless legs. followup with me again in one year, but call if your leg movements continue to be an issue.

## 2014-06-21 NOTE — Assessment & Plan Note (Signed)
The patient feels that she is having increased restless leg symptoms, and unfortunately has been taking increasing doses of her Requip. I have explained to her about the concept of "augmentation", where higher doses of the medication can actually make her limb movements worse. I have stressed to her the importance of exercise in the treatment of restless legs, and she also needs to have an iron panel done with her upcoming visit to her primary care physician to make sure she does not have iron deficiency which can worsen limb movements as well. I have also told her that untreated sleep apnea can worsen her limb movements, as well as some antidepressants. I have asked her to go back to her prescribed dose of Requip, work on exercise, have her iron panel checked with her upcoming physical, and to work on getting her sleep apnea adequately treated again. If she continues to have issues, we can discuss changing her medication.

## 2014-06-21 NOTE — Telephone Encounter (Signed)
Patient called to check on the status of her medication Fluoxetine, she needed a refill.  I advised patient that I just received a [facility] from [facility] and prior authorization was done.  I advised patient normally it takes 24-72 hrs to get a response back from the insurance plan about their decision.  I advised patient that hopefully this will be resolved by Monday.  Patient stated that she is completely out of the 20 mg Fluoxetine but she does have some 40 mg tablets.  Patient stated that she will cut the 40 mg Fluoxetine in half until she hears back from me about the prior authorization.

## 2014-06-21 NOTE — Progress Notes (Signed)
   Subjective:    Patient ID: [name], female    DOB: 1945-12-12, 69 y.o.   MRN: [id number]  HPI The patient comes in today for follow-up of her known obstructive sleep apnea and restless leg syndrome. She has been using a dental appliance for treatment of her sleep-disordered breathing, but has not been able to do so recently because of dental issues. She has not discussed this with her prescriber of the dental appliance. On a positive note, she has lost weight since the last visit. She has been taking her Requip, but unfortunately in higher doses because of her restless leg symptoms. She was not aware of augmentation that can occur at higher doses. She feels that her symptoms are getting worse, and that her current dose may not be effectively controlling her symptoms.   Review of Systems  Constitutional: Negative for fever and unexpected weight change.  HENT: Negative for congestion, dental problem, ear pain, nosebleeds, postnasal drip, rhinorrhea, sinus pressure, sneezing, sore throat and trouble swallowing.   Eyes: Negative for redness and itching.  Respiratory: Negative for cough, chest tightness, shortness of breath and wheezing.   Cardiovascular: Negative for palpitations and leg swelling.  Gastrointestinal: Negative for nausea and vomiting.  Genitourinary: Negative for dysuria.  Musculoskeletal: Negative for joint swelling.  Skin: Negative for rash.  Neurological: Negative for headaches.  Hematological: Does not bruise/bleed easily.  Psychiatric/Behavioral: Negative for dysphoric mood. The patient is not nervous/anxious.        Objective:   Physical Exam Overweight female in no acute distress Nose without purulence or discharge noted Neck without lymphadenopathy or thyromegaly Lower extremities with mild edema, no cyanosis Alert and oriented, does not appear to be sleepy, moves all 4 extremities.       Assessment & Plan:

## 2014-06-21 NOTE — Telephone Encounter (Signed)
Received via fax a request for a prior authorization for patient's Fluoxetine.  I called [phone]. I spoke with Lanelle Bal.  Per Lanelle Bal a request will be sent to their clinical pharmacy for review.  We will receive a response back with a decision 24-48 hrs.  [id number]::[id number]  Patient due to follow up 06-27-14.

## 2014-06-23 NOTE — Telephone Encounter (Signed)
Medication refill request for Ativan - declined as was last written 03/28/14 plus 2 refills and patient returns 06/27/14

## 2014-06-25 ENCOUNTER — Telehealth (HOSPITAL_COMMUNITY): Payer: Self-pay | Admitting: *Deleted

## 2014-06-25 NOTE — Telephone Encounter (Signed)
Received [facility] from [facility] about a prior authorization needed to be done for patient's Fluoxetine capsules. Prior authorization was completed.  [facility] spoke with Linna Hoff, per Linna Hoff patient did pick up her medication.

## 2014-06-27 ENCOUNTER — Encounter (HOSPITAL_COMMUNITY): Payer: Self-pay | Admitting: Psychiatry

## 2014-06-27 ENCOUNTER — Ambulatory Visit (INDEPENDENT_AMBULATORY_CARE_PROVIDER_SITE_OTHER): Payer: 59 | Admitting: Psychiatry

## 2014-06-27 VITALS — BP 128/80 | HR 74 | Ht 65.0 in | Wt 218.0 lb

## 2014-06-27 DIAGNOSIS — F4310 Post-traumatic stress disorder, unspecified: Secondary | ICD-10-CM

## 2014-06-27 DIAGNOSIS — F3130 Bipolar disorder, current episode depressed, mild or moderate severity, unspecified: Secondary | ICD-10-CM | POA: Diagnosis not present

## 2014-06-27 MED ORDER — BUPROPION HCL ER (XL) 300 MG PO TB24: 300.0000 mg | ORAL_TABLET | Freq: Every day | ORAL | Status: DC

## 2014-06-27 MED ORDER — LAMOTRIGINE 200 MG PO TABS: 200.0000 mg | ORAL_TABLET | Freq: Every day | ORAL | Status: DC

## 2014-06-27 MED ORDER — FLUOXETINE HCL 20 MG PO CAPS: 20.0000 mg | ORAL_CAPSULE | Freq: Every day | ORAL | Status: DC

## 2014-06-27 NOTE — Progress Notes (Signed)
Patient ID: [name], female   DOB: July 07, 1945, 69 y.o.   MRN: [id number]  [place] [address] Progress Note  [name] [id number] 69 y.o.  06/27/2014 10:10 AM  Chief Complaint: "my life has been very good"  History of Present Illness: Pt has reconnected with an old flame and is feeling good. Pt is very much in love. Pt states he makes her feel useful.   Energy and motivation remain low but she occasionally gets "spurts" of 8 hrs of energy.  She gets down for a few hours once a week. Denies isolation, anhedonia, crying spells, hopelessness and worthlessness. Concentration has improved and she finished a book. Appetite is good and she has lost a few pounds. Sleep remains poor due to sleep apnea. She is working a sleep specialist for her sleep apnea. Reports 2 random nights where she couldn't sleep and was feeling happy. She spend a lot of time reading and watching tv. Pt attributes change to her Prozac.   Denies manic and hypomanic symptoms including periods of decreased need for sleep, increased energy, mood lability, impulsivity, FOI, and excessive spending.  PTSD- denies any concerns. Reports 4 separate occassions of HV and startle reaction. Thinks it is due to conversations about her past.  Triggers cause intrusive memories but otherwise not.   Anxiety is always present but it is much better with Bystolic. Lately it is increased due to recent move in wit her boyfriend. She is taking Ativan once a day and rarely twice a day.   Wellbutrin, Lamictal, Ativan, and Prozac are effective and denies SE.    Suicidal Ideation: No Plan Formed: No Patient has means to carry out plan: No  Homicidal Ideation: No Plan Formed: No Patient has means to carry out plan: No  Review of Systems: Psychiatric: Agitation: No Hallucination: No Depressed Mood: Yes Insomnia: Yes Hypersomnia: No Altered Concentration: No  Feels Worthless: No Grandiose Ideas: No Belief In Special  Powers: No New/Increased Substance Abuse: No Compulsions: No  Review of Systems  Constitutional: Positive for weight loss. Negative for fever and chills.  HENT: Positive for congestion. Negative for ear pain, nosebleeds and sore throat.   Eyes: Negative for blurred vision, double vision and pain.  Respiratory: Positive for cough and sputum production. Negative for wheezing.   Cardiovascular: Negative for chest pain, palpitations and leg swelling.  Gastrointestinal: Positive for heartburn and nausea. Negative for vomiting and abdominal pain.  Musculoskeletal: Positive for back pain. Negative for joint pain and neck pain.  Skin: Negative for itching and rash.  Neurological: Positive for dizziness and headaches. Negative for sensory change, seizures and loss of consciousness.  Psychiatric/Behavioral: Positive for depression. Negative for suicidal ideas, hallucinations and substance abuse. The patient is nervous/anxious and has insomnia.    Neurologic: Headache: Yes Seizure: No Paresthesias: No  Past Medical Family, Social History: Pt is divorced and living in [place]. Pt lives with her boyfriend. Unemployed. She has one daughter.  reports that she has never smoked. She has never used smokeless tobacco. She reports that she does not drink alcohol or use illicit drugs.  Family History  Problem Relation Age of Onset  . Allergies Father   . Skin cancer Father   . Bipolar disorder Father   . Alcohol abuse Father   . Allergies Sister     multiple  . Allergies Brother     multiple  . Allergies Daughter   . Heart disease Mother   . Depression Mother   .  Hypertension Mother   . CAD Mother   . Heart disease Maternal Grandfather   . Colon cancer Paternal Grandmother   . OCD Paternal Grandmother   . Cancer Paternal Grandfather    Past Medical History  Diagnosis Date  . Abscess of Bartholin's gland   . History of pleural effusion   . History of attention deficit disorder   .  History of endometriosis   . RLS (restless legs syndrome)   . Anxiety   . Obesity   . GERD (gastroesophageal reflux disease)   . Renal mass 02/15/2012  . Depression     Bipolar disorder/goes to [facility] for meds  . OSA (obstructive sleep apnea)     uses oral appliance instead of CPAP  . Vertigo   . Chronic kidney disease     kidney cancer- pt states she has elected to not have it treated.  Marland Kitchen PONV (postoperative nausea and vomiting)   . Difficult intubation     told by [facility] that she was hard to intubate 15b yrs ago in [place]- surgery since then no problems  . Adverse effect of general anesthetic     felt paralyzed while receiving anesthesia  . Bipolar 1 disorder   . PTSD (post-traumatic stress disorder)   . ADHD (attention deficit hyperactivity disorder)   . Migraines   . rt renal ca dx'd 12/2009    no treatment/ no surg  . Acute vestibular neuronitis     Dr Lucia Gaskins  . RLS (restless legs syndrome)     Dr Gwenette Greet  . H/O echocardiogram 07/2012    Normla LVF w grade I siastolic dysfunction   . PVC's (premature ventricular contractions)   . Hypertension   . 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 Dysrhythmia     h/o atrial tachycardia- s/p ablation      Outpatient Encounter Prescriptions as of 06/27/2014  Medication Sig  . aspirin 81 MG EC tablet Take 1 tablet (81 mg total) by mouth daily. For heart health  . buPROPion (WELLBUTRIN XL) 300 MG 24 hr tablet Take 1 tablet (300 mg total) by mouth daily. For depression  . butalbital-acetaminophen-caffeine (FIORICET, ESGIC) 50-325-40 MG per tablet Take 1 tablet by mouth every 6 (six) hours as needed.  Marland Kitchen BYSTOLIC 5 MG tablet TAKE 1/2 A TABLET (2.5 MGS) BY MOUTH DAILY  . FLUoxetine (PROZAC) 20 MG capsule Take 1 capsule (20 mg total) by mouth daily.  Marland Kitchen lamoTRIgine (LAMICTAL) 200 MG tablet Take 1 tablet (200 mg total) by mouth daily.  Marland Kitchen levothyroxine (SYNTHROID, LEVOTHROID) 88 MCG tablet Take 1 tablet (88  mcg total) by mouth daily before breakfast. For low thyroid function  . LORazepam (ATIVAN) 1 MG tablet Take 1 tablet (1 mg total) by mouth 2 (two) times daily as needed for anxiety.  . promethazine (PHENERGAN) 25 MG tablet Take 25 mg by mouth every 6 (six) hours as needed. for nausea  . rOPINIRole (REQUIP) 0.5 MG tablet TAKE 1 TABLET BY MOUTH LATE IN THE AFTERNOON AND 2 TABS AFTER DINNER (Patient taking differently: TAKE 2 TABLET BY MOUTH LATE IN THE AFTERNOON AND 2 TABS AFTER DINNER)  . traMADol (ULTRAM) 50 MG tablet   . valsartan-hydrochlorothiazide (DIOVAN-HCT) 320-25 MG per tablet Take 1 tablet by mouth daily. High blood pressure control    Past Psychiatric History/Hospitalization(s): Anxiety: Yes Bipolar Disorder: Yes Depression: Yes Mania: Yes Psychosis: No Schizophrenia: No Personality Disorder: No Hospitalization for psychiatric illness: Yes History of Electroconvulsive  Shock Therapy: No Prior Suicide Attempts: No  Physical Exam: Constitutional:  BP 128/80 mmHg  Pulse 74  Ht 5\' 5"  (1.651 m)  Wt 218 lb (98.884 kg)  BMI 36.28 kg/m2  General Appearance: alert, oriented, no acute distress  Musculoskeletal: Strength & Muscle Tone: within normal limits Gait & Station: normal Patient leans: N/A  Mental Status Examination/Evaluation: Objective: Attitude: Calm and cooperative  Appearance: Fairly Groomed, appears to be stated age  Engineer, water::  Fair  Speech:  Clear and Coherent and Normal Rate  Volume:  Normal  Mood:  euthymic  Affect:  Full Range  Thought Process:  Intact, Linear and Logical  Orientation:  Full (Time, Place, and Person)  Thought Content:  Negative  Suicidal Thoughts:  No  Homicidal Thoughts:  No  Judgement:  Fair  Insight:  Fair  Concentration: good  Memory: Immediate-intact Recent-intact Remote-intact  Recall: fair  Language: fair  Gait and Station: normal  [employer] of Knowledge: average  Psychomotor Activity:  Normal  Akathisia:  No   Handed:  Right  AIMS (if indicated):  n/a      Medical Decision Making (Choose Three): Established Problem, Stable/Improving (1), Review of Psycho-Social Stressors (1) and Review of Medication Regimen & Side Effects (2)  Assessment: Axis I: Bipolar d/o- current episode depressed;  PTSD Axis II: deferred Axis III:  Past Medical History  Diagnosis Date  . Abscess of Bartholin's gland   . History of pleural effusion   . History of attention deficit disorder   . History of endometriosis   . RLS (restless legs syndrome)   . Anxiety   . Obesity   . GERD (gastroesophageal reflux disease)   . Renal mass 02/15/2012  . Depression     Bipolar disorder/goes to [facility] for meds  . OSA (obstructive sleep apnea)     uses oral appliance instead of CPAP  . Vertigo   . Chronic kidney disease     kidney cancer- pt states she has elected to not have it treated.  Marland Kitchen PONV (postoperative nausea and vomiting)   . Difficult intubation     told by [facility] that she was hard to intubate 15b yrs ago in [place]- surgery since then no problems  . Adverse effect of general anesthetic     felt paralyzed while receiving anesthesia  . Bipolar 1 disorder   . PTSD (post-traumatic stress disorder)   . ADHD (attention deficit hyperactivity disorder)   . Migraines   . rt renal ca dx'd 12/2009    no treatment/ no surg  . Acute vestibular neuronitis     Dr Lucia Gaskins  . RLS (restless legs syndrome)     Dr Gwenette Greet  . H/O echocardiogram 07/2012    Normla LVF w grade I siastolic dysfunction   . PVC's (premature ventricular contractions)   . Hypertension   . 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 Dysrhythmia     h/o atrial tachycardia- s/p ablation    Axis IV: poor coping skills  Axis V: GAF 60   Plan:  Ativan 1mg  BID prn anxiety. Pt has script so no new one given today Lamictal 200mg  po qD (Pt was only taking 150mg  previously) for mood stabalization Continue Wellbutrin  XL 300mg  po qD for depression Continue Prozac 20mg  po qD for depression and anxiety and PTSD  Medication management with supportive therapy. Risks/benefits and SE of the medication discussed. Pt verbalized understanding and verbal consent obtained for treatment.  Affirm with the patient that the medications are taken as ordered. Patient expressed understanding of how their medications were to be used.  -improvement of symptoms  Reviewed labs from 02/07/2014: CBC WNL, CMP WNL,     Therapy: brief supportive therapy provided. Discussed psychosocial stressors in detail.    Pt denies SI and is at an acute low risk for suicide.Patient told to call clinic if any problems occur. Patient advised to go to ER if they should develop SI/HI, side effects, or if symptoms worsen. Has crisis numbers to call if needed. Pt verbalized understanding.  F/up in 2 months or sooner if needed  [name], MD 06/27/2014

## 2014-06-28 ENCOUNTER — Ambulatory Visit (INDEPENDENT_AMBULATORY_CARE_PROVIDER_SITE_OTHER): Payer: 59 | Admitting: Psychiatry

## 2014-06-28 ENCOUNTER — Other Ambulatory Visit (HOSPITAL_COMMUNITY): Payer: Self-pay | Admitting: Psychiatry

## 2014-06-28 DIAGNOSIS — F3130 Bipolar disorder, current episode depressed, mild or moderate severity, unspecified: Secondary | ICD-10-CM | POA: Diagnosis not present
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
## 2014-07-04 ENCOUNTER — Other Ambulatory Visit (HOSPITAL_COMMUNITY): Payer: Self-pay | Admitting: Psychiatry

## 2014-07-05 ENCOUNTER — Ambulatory Visit (INDEPENDENT_AMBULATORY_CARE_PROVIDER_SITE_OTHER): Payer: 59 | Admitting: Psychiatry

## 2014-07-05 ENCOUNTER — Telehealth (HOSPITAL_COMMUNITY): Payer: Self-pay | Admitting: *Deleted

## 2014-07-05 ENCOUNTER — Other Ambulatory Visit: Payer: Self-pay | Admitting: Cardiology

## 2014-07-05 DIAGNOSIS — F3130 Bipolar disorder, current episode depressed, mild or moderate severity, unspecified: Secondary | ICD-10-CM | POA: Diagnosis not present

## 2014-07-05 NOTE — Telephone Encounter (Signed)
Patient had an appointment today with Anderson Malta.  Patient had question about Ativan RX refill.  I advised patient we called [facility] on [facility]14 and they said last RX filled for Ativan was from a December RX.  I advised patient her office visit with Dr. Doyne Keel on 06-27-14, Ativan was discussed then, no refill needed, patient had medication. With patient in front of me and no other patient's around I called [facility] and they verified what Almyra Free stated previous RX filled in January from December RX. Patient stated she does not remember getting script in January from you. Then patient stated she may have lost it when she moved, not sure what happened.  Patient stated that she needs the RX but can wait for Tuesday for your response.  Shawn T., RN was up front as well when speaking with the patient.   Patient stated before leaving she will try and get a weeks worth from her PCP.

## 2014-07-05 NOTE — Progress Notes (Signed)
   THERAPIST PROGRESS NOTE  Session Time: 1:20-2:00  Participation Level: Active   Behavioral Response: CasualAlertEuthymic  Type of Therapy: Individual Therapy   Treatment Goals addressed: Anxiety   Interventions: CBT   Summary: [name] is a 69 y.o. female who presents with anxiety and depression.   Suicidal/Homicidal: Nowithout intent/plan   Therapist Response: Pt. Arrived 20 minutes late for session again. Pt. Reported that she is out of her medication and frustrated with office because she has not received response from her psychiatrist. Pt. Reports that she has been more anxious than usual but attributes that do being out of her anxiety medication. Pt. Reports that she continues to exercise healthy boundaries with her partner and are sharing living space several days a week and have agreed on roles related to household duties. Pt. Shared family pictures of herself and her grandmother and discussed grandmother's family history and connected to current fears about financial need.   Plan: Pt. To continue with CBT based therapy. Return again in 1 week.   Diagnosis: Axis I: Depressive Disorder NOS    Nancie Neas, [facility] 07/05/2014

## 2014-07-08 ENCOUNTER — Telehealth (HOSPITAL_COMMUNITY): Payer: Self-pay

## 2014-07-08 DIAGNOSIS — F4310 Post-traumatic stress disorder, unspecified: Secondary | ICD-10-CM

## 2014-07-08 NOTE — Telephone Encounter (Signed)
Medication management - Called patient back after she left a message reporting no one would call her back or let her know why Dr. Doyne Keel had denied her refill of Ativan.  Discussed with patient the conversation she had with Hope Pigeon, CMA which was witnessed by this nurse on 07/05/14 explaining why new orders were not approved, as patient has informed Dr. Doyne Keel at last evaluation she still had medication orders.  Patient agreed with this report and that this had been told to her on 07/05/14 but now reports she has not been able to find her prescription written on 03/28/14 with 2 refills and is going to be out of Ativan after 2 more pills.  Patient reported getting a few days from her PCP on 07/05/14 until Dr. Doyne Keel could respond with her new request. Informed Dr. Doyne Keel may have to check with the national registry to make sure misplaced prescription had not been filled and patient was fine with this.  Patient requested Dr. Doyne Keel or her nurse call her back on 07/09/14 with approval or denial of needed new prescription.  Patient reported taking the medication as prescribed but reports she must have misplaced the last prescription.

## 2014-07-10 MED ORDER — LORAZEPAM 1 MG PO TABS: 1.0000 mg | ORAL_TABLET | Freq: Two times a day (BID) | ORAL | Status: DC | PRN

## 2014-07-10 NOTE — Telephone Encounter (Signed)
Met with Dr. Doyne Keel 2 times on 07/08/14 to verify [facility] any prescriptions for Lorazepam in their data base from prescription written 03/28/14.  Called in new one time order of authorized Ativan refill today per Dr. Doyne Keel with Almyra Free, pharmacist at [facility] on [address].  Instructed pharmacist if patient found past prescription to please discontinue and contact or office for verification of future refills. Message left for patient the order was phoned in [facility] this date by Hope Pigeon, CMA with request to return lost prescription for Ativan written 03/28/14 if found at a later date.

## 2014-07-12 ENCOUNTER — Ambulatory Visit (INDEPENDENT_AMBULATORY_CARE_PROVIDER_SITE_OTHER): Payer: 59 | Admitting: Psychiatry

## 2014-07-12 DIAGNOSIS — F4310 Post-traumatic stress disorder, unspecified: Secondary | ICD-10-CM

## 2014-07-12 DIAGNOSIS — F3130 Bipolar disorder, current episode depressed, mild or moderate severity, unspecified: Secondary | ICD-10-CM | POA: Diagnosis not present

## 2014-07-15 NOTE — Progress Notes (Signed)
   THERAPIST PROGRESS NOTE  Session Time: 1:00-2:00  Participation Level: Active   Behavioral Response: CasualAlertEuthymic  Type of Therapy: Individual Therapy   Treatment Goals addressed: Anxiety   Interventions: CBT   Summary: [name] is a 69 y.o. female who presents with anxiety and depression.   Suicidal/Homicidal: Nowithout intent/plan   Therapist Response:Pt. Presents with euthymic, mildly anxious mood. Pt. Reports that most significant stressor is relationship with her boyfriend. Pt. Is aware of what she believes to be his dependence on anxiety medication ad concerns about his behavior when he is overmedicated. Pt. Reports that she continues to sleep well and exercise good self-care. Reviewed the grounding sequence with demonstrations i.e. 4-3-8 breathing, bumble bee breath, tree pose, forward fold on the chair and legs up the wall. Pt. Was encouraged to use the exercises 4-6 days a week and to introduce them to her partner.  Plan: Pt. To continue with CBT based therapy. Return again in 1 week.   Diagnosis: Axis I: Depressive Disorder NOS   Nancie Neas, [facility] 07/15/2014

## 2014-07-19 ENCOUNTER — Ambulatory Visit (HOSPITAL_COMMUNITY): Payer: Self-pay | Admitting: Psychiatry

## 2014-08-22 ENCOUNTER — Ambulatory Visit (INDEPENDENT_AMBULATORY_CARE_PROVIDER_SITE_OTHER): Payer: 59 | Admitting: Psychiatry

## 2014-08-22 DIAGNOSIS — F4310 Post-traumatic stress disorder, unspecified: Secondary | ICD-10-CM | POA: Diagnosis not present
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
## 2014-08-27 ENCOUNTER — Encounter (HOSPITAL_COMMUNITY): Payer: Self-pay | Admitting: Psychiatry

## 2014-08-27 ENCOUNTER — Ambulatory Visit (INDEPENDENT_AMBULATORY_CARE_PROVIDER_SITE_OTHER): Payer: 59 | Admitting: Psychiatry

## 2014-08-27 VITALS — BP 111/69 | HR 68 | Ht 64.0 in | Wt 214.4 lb

## 2014-08-27 DIAGNOSIS — F3130 Bipolar disorder, current episode depressed, mild or moderate severity, unspecified: Secondary | ICD-10-CM

## 2014-08-27 DIAGNOSIS — F319 Bipolar disorder, unspecified: Secondary | ICD-10-CM | POA: Diagnosis not present

## 2014-08-27 DIAGNOSIS — F4310 Post-traumatic stress disorder, unspecified: Secondary | ICD-10-CM | POA: Diagnosis not present

## 2014-08-27 MED ORDER — LORAZEPAM 1 MG PO TABS: 1.0000 mg | ORAL_TABLET | Freq: Two times a day (BID) | ORAL | Status: DC | PRN

## 2014-08-27 MED ORDER — FLUOXETINE HCL 20 MG PO CAPS: 20.0000 mg | ORAL_CAPSULE | Freq: Every day | ORAL | Status: DC

## 2014-08-27 MED ORDER — LAMOTRIGINE 200 MG PO TABS: 200.0000 mg | ORAL_TABLET | Freq: Every day | ORAL | Status: DC

## 2014-08-27 MED ORDER — BUPROPION HCL ER (XL) 300 MG PO TB24: 300.0000 mg | ORAL_TABLET | Freq: Every day | ORAL | Status: DC

## 2014-08-27 NOTE — Progress Notes (Signed)
[facility] [id number] Progress Note  [name] [id number] 69 y.o.  08/27/2014 11:01 AM  Chief Complaint: "It has been one year since I had any bad depression"  History of Present Illness: Pt has reconnected with an old flame and is feeling good. Pt is very much in love and they working thru their problems. They are now living together. She is not watching tv or reading but is very social with her boyfriend.  Pt is looking for hobbies and tings to get involved in.    Energy and motivation remain low but she occasionally gets "spurts" of 8 hrs of energy. States her apartment is a [employer].   Denies depression. Sometimes she feels the need to get away for a little while. Denies isolation, anhedonia, crying spells, hopelessness and worthlessness.   Concentration has improved but remains short. Appetite is good and she has lost a few pounds. Sleeping about 14 hrs/night but she wakes up several times during the night. She is working a sleep specialist for her sleep apnea.   Today denies manic and hypomanic symptoms including periods of decreased need for sleep, increased energy, mood lability, impulsivity, FOI, and excessive spending. In the last 3 months she has a few days of feeling down, irritable and restless. It lasted a few days and resolved on it.   PTSD- reports increased startle reaction. Denies HV, nightmares and flashbacks. Triggers cause intrusive memories but otherwise not.   Anxiety is always present but it is much better with Bystolic. She is taking Ativan once a day a few days a week and rarely twice a day. She tries to use coping skills before taking Ativan.  Therapy is going well.   Wellbutrin, Lamictal, Ativan, and Prozac are effective and denies SE. States RLS is getting worse in that is it more frequent.   Suicidal Ideation: No Plan Formed: No Patient has means to carry out plan: No  Homicidal Ideation: No Plan Formed: No Patient has means to carry out  plan: No  Review of Systems: Psychiatric: Agitation: No Hallucination: No Depressed Mood: No Insomnia: No Hypersomnia: No Altered Concentration: No  Feels Worthless: No Grandiose Ideas: No Belief In Special Powers: No New/Increased Substance Abuse: No Compulsions: No  Review of Systems  Constitutional: Positive for malaise/fatigue. Negative for fever, chills and weight loss.  HENT: Negative for congestion, ear pain, nosebleeds and sore throat.   Eyes: Positive for blurred vision. Negative for double vision, pain and redness.  Respiratory: Negative for cough, sputum production and shortness of breath.   Cardiovascular: Negative for chest pain, palpitations and leg swelling.  Gastrointestinal: Positive for heartburn and abdominal pain. Negative for nausea and vomiting.  Musculoskeletal: Positive for back pain and joint pain. Negative for myalgias and neck pain.  Skin: Negative for itching and rash.  Neurological: Negative for dizziness, sensory change, seizures, loss of consciousness and headaches.  Psychiatric/Behavioral: Negative for depression, suicidal ideas, hallucinations and substance abuse. The patient is nervous/anxious. The patient does not have insomnia.    Neurologic: Headache: Yes Seizure: No Paresthesias: No  Past Medical Family, Social History: Pt is divorced and living in [place]. Pt lives with her boyfriend. Unemployed. She has one daughter.  reports that she has never smoked. She has never used smokeless tobacco. She reports that she does not drink alcohol or use illicit drugs.  Family History  Problem Relation Age of Onset  . Allergies Father   . Skin cancer Father   . Bipolar disorder Father   .  Alcohol abuse Father   . Allergies Sister     multiple  . Allergies Brother     multiple  . Allergies Daughter   . Heart disease Mother   . Depression Mother   . Hypertension Mother   . CAD Mother   . Heart disease Maternal Grandfather   . Colon cancer  Paternal Grandmother   . OCD Paternal Grandmother   . Cancer Paternal Grandfather    Past Medical History  Diagnosis Date  . Abscess of Bartholin's gland   . History of pleural effusion   . History of attention deficit disorder   . History of endometriosis   . RLS (restless legs syndrome)   . Anxiety   . Obesity   . GERD (gastroesophageal reflux disease)   . Renal mass 02/15/2012  . Depression     Bipolar disorder/goes to [facility] for meds  . OSA (obstructive sleep apnea)     uses oral appliance instead of CPAP  . Vertigo   . Chronic kidney disease     kidney cancer- pt states she has elected to not have it treated.  Marland Kitchen PONV (postoperative nausea and vomiting)   . Difficult intubation     told by [facility] that she was hard to intubate 15b yrs ago in [place]- surgery since then no problems  . Adverse effect of general anesthetic     felt paralyzed while receiving anesthesia  . Bipolar 1 disorder   . PTSD (post-traumatic stress disorder)   . ADHD (attention deficit hyperactivity disorder)   . Migraines   . rt renal ca dx'd 12/2009    no treatment/ no surg  . Acute vestibular neuronitis     Dr Lucia Gaskins  . RLS (restless legs syndrome)     Dr Gwenette Greet  . H/O echocardiogram 07/2012    Normla LVF w grade I siastolic dysfunction   . PVC's (premature ventricular contractions)   . Hypertension   . 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 Dysrhythmia     h/o atrial tachycardia- s/p ablation      Outpatient Encounter Prescriptions as of 08/27/2014  Medication Sig  . aspirin 81 MG EC tablet Take 1 tablet (81 mg total) by mouth daily. For heart health  . buPROPion (WELLBUTRIN XL) 300 MG 24 hr tablet Take 1 tablet (300 mg total) by mouth daily. For depression  . butalbital-acetaminophen-caffeine (FIORICET, ESGIC) 50-325-40 MG per tablet Take 1 tablet by mouth every 6 (six) hours as needed.  Marland Kitchen BYSTOLIC 5 MG tablet TAKE 1/2 A TABLET (2.5 MGS) BY MOUTH  DAILY(PAYABLE ON 04/03/14  . FLUoxetine (PROZAC) 20 MG capsule Take 1 capsule (20 mg total) by mouth daily.  Marland Kitchen lamoTRIgine (LAMICTAL) 200 MG tablet Take 1 tablet (200 mg total) by mouth daily.  Marland Kitchen levothyroxine (SYNTHROID, LEVOTHROID) 88 MCG tablet Take 1 tablet (88 mcg total) by mouth daily before breakfast. For low thyroid function  . LORazepam (ATIVAN) 1 MG tablet Take 1 tablet (1 mg total) by mouth 2 (two) times daily as needed for anxiety.  . promethazine (PHENERGAN) 25 MG tablet Take 25 mg by mouth every 6 (six) hours as needed. for nausea  . rOPINIRole (REQUIP) 0.5 MG tablet TAKE 1 TABLET BY MOUTH LATE IN THE AFTERNOON AND 2 TABS AFTER DINNER (Patient taking differently: TAKE 2 TABLET BY MOUTH LATE IN THE AFTERNOON AND 2 TABS AFTER DINNER)  . traMADol (ULTRAM) 50 MG tablet   . valsartan-hydrochlorothiazide (DIOVAN-HCT) 320-25 MG  per tablet Take 1 tablet by mouth daily. High blood pressure control   No facility-administered encounter medications on file as of 08/27/2014.    Past Psychiatric History/Hospitalization(s): Anxiety: Yes Bipolar Disorder: Yes Depression: Yes Mania: Yes Psychosis: No Schizophrenia: No Personality Disorder: No Hospitalization for psychiatric illness: Yes History of Electroconvulsive Shock Therapy: No Prior Suicide Attempts: No  Physical Exam: Constitutional:  BP 111/69 mmHg  Pulse 68  Ht 5\' 4"  (1.626 m)  Wt 214 lb 6.4 oz (97.251 kg)  BMI 36.78 kg/m2  General Appearance: alert, oriented, no acute distress  Musculoskeletal: Strength & Muscle Tone: within normal limits Gait & Station: normal Patient leans: N/A  Mental Status Examination/Evaluation: Objective: Attitude: Calm and cooperative  Appearance: Fairly Groomed, appears to be stated age  Engineer, water::  Fair  Speech:  Clear and Coherent and Normal Rate  Volume:  Normal  Mood:  euthymic  Affect:  Full Range  Thought Process:  Intact, Linear and Logical  Orientation:  Full (Time, Place, and  Person)  Thought Content:  Negative  Suicidal Thoughts:  No  Homicidal Thoughts:  No  Judgement:  Fair  Insight:  Fair  Concentration: good  Memory: Immediate-intact Recent-intact Remote-intact  Recall: fair  Language: fair  Gait and Station: normal  [employer] of Knowledge: average  Psychomotor Activity:  Normal  Akathisia:  No  Handed:  Right  AIMS (if indicated):  n/a      Medical Decision Making (Choose Three): Established Problem, Stable/Improving (1), Review of Psycho-Social Stressors (1) and Review of Medication Regimen & Side Effects (2)  Assessment: Axis I: Bipolar d/o- current episode unspecified;  PTSD Axis II: deferred Axis III:  Past Medical History  Diagnosis Date  . Abscess of Bartholin's gland   . History of pleural effusion   . History of attention deficit disorder   . History of endometriosis   . RLS (restless legs syndrome)   . Anxiety   . Obesity   . GERD (gastroesophageal reflux disease)   . Renal mass 02/15/2012  . Depression     Bipolar disorder/goes to [facility] for meds  . OSA (obstructive sleep apnea)     uses oral appliance instead of CPAP  . Vertigo   . Chronic kidney disease     kidney cancer- pt states she has elected to not have it treated.  Marland Kitchen PONV (postoperative nausea and vomiting)   . Difficult intubation     told by [facility] that she was hard to intubate 15b yrs ago in [place]- surgery since then no problems  . Adverse effect of general anesthetic     felt paralyzed while receiving anesthesia  . Bipolar 1 disorder   . PTSD (post-traumatic stress disorder)   . ADHD (attention deficit hyperactivity disorder)   . Migraines   . rt renal ca dx'd 12/2009    no treatment/ no surg  . Acute vestibular neuronitis     Dr Lucia Gaskins  . RLS (restless legs syndrome)     Dr Gwenette Greet  . H/O echocardiogram 07/2012    Normla LVF w grade I siastolic dysfunction   . PVC's (premature ventricular contractions)   . Hypertension   . 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 Dysrhythmia     h/o atrial tachycardia- s/p ablation    Axis IV: poor coping skills  Axis V: GAF 60   Plan:  Ativan 1mg  BID prn anxiety. Pt has script so no new one given today  Lamictal 200mg  po qD for mood stabalization Continue Wellbutrin XL 300mg  po qD for depression Continue Prozac 20mg  po qD for depression and anxiety and PTSD  Medication management with supportive therapy. Risks/benefits and SE of the medication discussed. Pt verbalized understanding and verbal consent obtained for treatment.  Affirm with the patient that the medications are taken as ordered. Patient expressed understanding of how their medications were to be used.  -improvement of symptoms  Reviewed labs from 02/07/2014: CBC WNL, CMP WNL,     Therapy: brief supportive therapy provided. Discussed psychosocial stressors in detail.  Encouraged to continue individual therapy.    Pt denies SI and is at an acute low risk for suicide.Patient told to call clinic if any problems occur. Patient advised to go to ER if they should develop SI/HI, side effects, or if symptoms worsen. Has crisis numbers to call if needed. Pt verbalized understanding.  F/up in 6 months or sooner if needed  [name], MD 08/27/2014

## 2014-08-28 ENCOUNTER — Other Ambulatory Visit: Payer: Self-pay | Admitting: Cardiology

## 2014-08-30 ENCOUNTER — Ambulatory Visit (INDEPENDENT_AMBULATORY_CARE_PROVIDER_SITE_OTHER): Payer: 59 | Admitting: Psychiatry

## 2014-08-30 DIAGNOSIS — F3130 Bipolar disorder, current episode depressed, mild or moderate severity, unspecified: Secondary | ICD-10-CM | POA: Diagnosis not present

## 2014-09-02 NOTE — Progress Notes (Signed)
   THERAPIST PROGRESS NOTE  Session Time: 1:05-2:05  Participation Level: Active   Behavioral Response: CasualAlertEuthymic  Type of Therapy: Individual Therapy   Treatment Goals addressed: Anxiety   Interventions: CBT   Summary: [name] is a 69 y.o. female who presents with anxiety and depression.   Suicidal/Homicidal: Nowithout intent/plan   Therapist Response:Pt. Continues to present with euthymic mood. Pt. Discussed relationship with significant other and caregiving responsibilities has her most significant stressor. Pt. Also discussed desire to reengage in creative outlets such as painting and beading. Pt. Was introduced to [email or web address] to begin networking with others with similar interests. Pt. Reviewed patterns of cognitive distortions including all-or-nothing thinking that interferes with her ability to get tasks done around the house and to re-engage in hobbies. Pt. Was encouraged to break household tasks into smaller tasks that are spread throughout the week in order to develop momentum.   Plan: Pt. To continue with CBT based therapy. Return again in 1 week.   Diagnosis: Axis I: Depressive Disorder NOS    Nancie Neas, [facility] 09/02/2014

## 2014-09-06 ENCOUNTER — Ambulatory Visit (INDEPENDENT_AMBULATORY_CARE_PROVIDER_SITE_OTHER): Payer: 59 | Admitting: Psychiatry

## 2014-09-06 DIAGNOSIS — F3130 Bipolar disorder, current episode depressed, mild or moderate severity, unspecified: Secondary | ICD-10-CM

## 2014-09-06 NOTE — Progress Notes (Signed)
   THERAPIST PROGRESS NOTE  Session Time: 1:30-2:00  Participation Level: Active   Behavioral Response: CasualAlertStressed/tearful  Type of Therapy: Individual Therapy   Treatment Goals addressed: Anxiety   Interventions: CBT   Summary: [name] is a 69 y.o. female who presents with anxiety and depression.   Suicidal/Homicidal: Nowithout intent/plan   Therapist Response:Pt. Was marked as a NS after 15 minutes. Counselor was called at 1:30 to alert that Pt. Had arrived. Pt. Presented as stressed, mildly restless and appropriately tearful. Pt. Reported that she has been under significant stress due to her boyfriends seizures. Pt. Expressed concern for his physical and mental health, specific concerns about cognitive impairment. Pt. Reports that she has neglected her self-care while providing care for him. Pt. Reported need to shower, wash her hair and reconnect with friends. Pt. Reported that she believed that she can continue to act as her boyfriend's caregiver as long as she can maintain the boundary of separate living spaces. Session focused on recommitment to self-care and maintaining healthy boundaries.   Plan: Pt. To continue with CBT based therapy. Return again in 1 week.   Diagnosis: Axis I: Depressive Disorder NOS    Nancie Neas, [facility] 09/06/2014

## 2014-09-13 ENCOUNTER — Ambulatory Visit (INDEPENDENT_AMBULATORY_CARE_PROVIDER_SITE_OTHER): Payer: 59 | Admitting: Psychiatry

## 2014-09-13 DIAGNOSIS — F3130 Bipolar disorder, current episode depressed, mild or moderate severity, unspecified: Secondary | ICD-10-CM | POA: Diagnosis not present

## 2014-09-13 NOTE — Progress Notes (Signed)
   THERAPIST PROGRESS NOTE  Session Time: 1:20-2:00  Participation Level: Active   Behavioral Response: CasualAlertStressed/tearful  Type of Therapy: Individual Therapy   Treatment Goals addressed: Anxiety   Interventions: CBT   Summary: [name] is a 69 y.o. female who presents with anxiety and depression.   Suicidal/Homicidal: Nowithout intent/plan   Therapist Response:Pt. Was 20 minutes late for today's appointment. Pt. Reports that she continues to be stressed as a result of her boyfriend's erratic behavior. Pt. Discussed awareness of understanding of his mental illness and prior acceptance that a relationship with him would require care-giving as a part of their relationship. However, Pt. Now reports that boundaries have been crossed and boyfriend has demonstrated capacity to be mean to her which she will not accept. Pt. Discussed setting boundary that he will have to psychiatrist and therapist regularly in order for her to consider maintaining relationship. Session focused on clarifying boundaries in relationship.   Plan: Pt. To continue with CBT based therapy. Return again in 1 week.   Diagnosis: Axis I: Depressive Disorder NOS    Nancie Neas, [facility] 09/13/2014

## 2014-09-16 ENCOUNTER — Encounter: Payer: Self-pay | Admitting: Cardiology

## 2014-09-16 ENCOUNTER — Other Ambulatory Visit: Payer: Self-pay

## 2014-09-16 ENCOUNTER — Ambulatory Visit (INDEPENDENT_AMBULATORY_CARE_PROVIDER_SITE_OTHER): Payer: Medicare Other | Admitting: Cardiology

## 2014-09-16 VITALS — BP 100/68 | HR 74 | Ht 64.0 in | Wt 214.2 lb

## 2014-09-16 DIAGNOSIS — I471 Supraventricular tachycardia: Secondary | ICD-10-CM

## 2014-09-16 DIAGNOSIS — I493 Ventricular premature depolarization: Secondary | ICD-10-CM | POA: Diagnosis not present

## 2014-09-16 DIAGNOSIS — I10 Essential (primary) hypertension: Secondary | ICD-10-CM | POA: Diagnosis not present

## 2014-09-16 DIAGNOSIS — R0602 Shortness of breath: Secondary | ICD-10-CM

## 2014-09-16 DIAGNOSIS — I4719 Other supraventricular tachycardia: Secondary | ICD-10-CM

## 2014-09-16 MED ORDER — NEBIVOLOL HCL 2.5 MG PO TABS: 2.5000 mg | ORAL_TABLET | Freq: Every day | ORAL | Status: DC

## 2014-09-16 NOTE — Addendum Note (Signed)
Addended by: Harland German A on: 09/16/2014 02:32 PM   Modules accepted: Orders

## 2014-09-16 NOTE — Patient Instructions (Signed)
Medication Instructions:  Your physician recommends that you continue on your current medications as directed. Please refer to the Current Medication list given to you today.   Labwork: None  Testing/Procedures: Your physician has requested that you have an echocardiogram. Echocardiography is a painless test that uses sound waves to create images of your heart. It provides your doctor with information about the size and shape of your heart and how well your heart's chambers and valves are working. This procedure takes approximately one hour. There are no restrictions for this procedure.   Dr. Turner recommends you have an EXERCISE MYOVIEW.  Follow-Up: Your physician wants you to follow-up in: 1 year with Dr. Turner. You will receive a reminder letter in the mail two months in advance. If you don't receive a letter, please call our office to schedule the follow-up appointment.  Any Other Special Instructions Will Be Listed Below (If Applicable).   

## 2014-09-16 NOTE — Progress Notes (Signed)
Cardiology Office Note   Date:  09/16/2014   ID:  Willa, Brocks 03-26-1945, MRN [id number]  PCP:   Melinda Crutch, MD    Chief Complaint  Patient presents with  . Follow-up    PVC's      History of Present Illness: [name] is a 69 y.o. female with a history of PVC's, diastolic dysfunction, PAT s/p ablation and HTN who presents today for followup. She is doing well but has been feeling "spacy" and has had some lightheadedness.   She denies any  LE edema,  palpitations or syncope. She has noticed heaviness and SOB in the hot weather.  The heaviness is mid sternal with no radiation.  She will get diaphoretic and nauseated.  This only happens when she is out in the hot weather and not inside.  She says that she will have episodes where she will break out in a profound sweat and then cannot breathe and feels nauseated.     Past Medical History  Diagnosis Date  . Abscess of Bartholin's gland   . History of pleural effusion   . History of attention deficit disorder   . History of endometriosis   . RLS (restless legs syndrome)   . Anxiety   . Obesity   . GERD (gastroesophageal reflux disease)   . Renal mass 02/15/2012  . Depression     Bipolar disorder/goes to [facility] for meds  . OSA (obstructive sleep apnea)     uses oral appliance instead of CPAP  . Vertigo   . Chronic kidney disease     kidney cancer- pt states she has elected to not have it treated.  Marland Kitchen PONV (postoperative nausea and vomiting)   . Difficult intubation     told by [facility] that she was hard to intubate 15b yrs ago in [place]- surgery since then no problems  . Adverse effect of general anesthetic     felt paralyzed while receiving anesthesia  . Bipolar 1 disorder   . PTSD (post-traumatic stress disorder)   . ADHD (attention deficit hyperactivity disorder)   . Migraines   . rt renal ca dx'd 12/2009    no treatment/ no surg  . Acute vestibular neuronitis     Dr Lucia Gaskins  .  RLS (restless legs syndrome)     Dr Gwenette Greet  . H/O echocardiogram 07/2012    Normla LVF w grade I siastolic dysfunction   . PVC's (premature ventricular contractions)   . Hypertension   . 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 Dysrhythmia     h/o atrial tachycardia- s/p ablation     Past Surgical History  Procedure Laterality Date  . Thyroidectomy  1990  . Tubal ligation  1970s  . Nasoseptal reconstruction  1990s  . Cardiac electrophysiology mapping and ablation  2000s  . Tonsillectomy      as a child  . Uterine mass removal  03/2012    was found to be benign     Current Outpatient Prescriptions  Medication Sig Dispense Refill  . aspirin 81 MG EC tablet Take 1 tablet (81 mg total) by mouth daily. For heart health 30 tablet 1  . buPROPion (WELLBUTRIN XL) 300 MG 24 hr tablet Take 1 tablet (300 mg total) by mouth daily. For depression (Patient taking differently: Take 300 mg by mouth daily. For depression and bipolar  disorder) 30 tablet 5  . butalbital-acetaminophen-caffeine (FIORICET, ESGIC) 50-325-40 MG per tablet Take 1 tablet by mouth every 6 (six) hours as needed for headache.   0  . BYSTOLIC 5 MG tablet TAKE 1/2 TABLET BY MOUTH EVERY DAY 45 tablet 0  . esomeprazole (NEXIUM) 40 MG capsule Take 40 mg by mouth as needed. GERD  3  . FLUoxetine (PROZAC) 20 MG capsule Take 1 capsule (20 mg total) by mouth daily. (Patient taking differently: Take 20 mg by mouth daily. ) 30 capsule 5  . fluticasone (FLONASE) 50 MCG/ACT nasal spray Place 1 spray into both nostrils as needed. Sinus problems  2  . lamoTRIgine (LAMICTAL) 200 MG tablet Take 1 tablet (200 mg total) by mouth daily. 30 tablet 5  . levothyroxine (SYNTHROID, LEVOTHROID) 88 MCG tablet Take 1 tablet (88 mcg total) by mouth daily before breakfast. For low thyroid function    . LORazepam (ATIVAN) 1 MG tablet Take 1 tablet (1 mg total) by mouth 2 (two) times daily as needed for anxiety. 60 tablet 3  .  rOPINIRole (REQUIP) 0.5 MG tablet TAKE 1 TABLET BY MOUTH LATE IN THE AFTERNOON AND 2 TABS AFTER DINNER (Patient taking differently: TAKE 2 TABLET BY MOUTH LATE IN THE AFTERNOON AND 2 TABS AFTER DINNER) 90 tablet 5  . valsartan-hydrochlorothiazide (DIOVAN-HCT) 320-25 MG per tablet Take 1 tablet by mouth daily. High blood pressure control 30 tablet 0   No current facility-administered medications for this visit.    Allergies:   Abilify; Compazine; Diflucan; Geodon; Lithium; Talwin; and Toradol    Social History:  The patient  reports that she has never smoked. She has never used smokeless tobacco. She reports that she does not drink alcohol or use illicit drugs.   Family History:  The patient's family history includes Alcohol abuse in her father; Allergies in her brother, daughter, father, and sister; Bipolar disorder in her father; CAD in her mother; Cancer in her paternal grandfather; Colon cancer in her paternal grandmother; Depression in her mother; Heart disease in her maternal grandfather and mother; Hypertension in her mother; OCD in her paternal grandmother; Skin cancer in her father.    ROS:  Please see the history of present illness.   Otherwise, review of systems are positive for none.   All other systems are reviewed and negative.    PHYSICAL EXAM: VS:  BP 100/68 mmHg  Pulse 74  Ht 5\' 4"  (1.626 m)  Wt 214 lb 3.2 oz (97.16 kg)  BMI 36.75 kg/m2 , BMI Body mass index is 36.75 kg/(m^2). GEN: Well nourished, well developed, in no acute distress HEENT: normal Neck: no JVD, carotid bruits, or masses Cardiac: RRR; no murmurs, rubs, or gallops,no edema  Respiratory:  clear to auscultation bilaterally, normal work of breathing GI: soft, nontender, nondistended, + BS MS: no deformity or atrophy Skin: warm and dry, no rash Neuro:  Strength and sensation are intact Psych: euthymic mood, full affect   EKG:  EKG was ordered today and showed NSR with no ST changes    Recent  Labs: 02/07/2014: ALT 17; BUN 16.4; Creatinine 0.8; HGB 12.8; Platelets 219; Potassium 3.9; Sodium 141    Lipid Panel    Component Value Date/Time   CHOL 167 04/21/2013 0530   TRIG 106 04/21/2013 0530   HDL 44 04/21/2013 0530   CHOLHDL 3.8 04/21/2013 0530   VLDL 21 04/21/2013 0530   LDLCALC 102* 04/21/2013 0530      Wt Readings from Last 3 Encounters:  09/16/14 214 lb 3.2 oz (97.16 kg)  08/27/14 214 lb 6.4 oz (97.251 kg)  06/27/14 218 lb (98.884 kg)    ASSESSMENT AND PLAN:  1. HTN well controlled - continue bystolic/Diovan HCT 2. PAT s/p ablation with no reoccurrence 3. PVC's - controlled on Bystolic 4. SOB with episodes of severe diaphoresis/nausea and SOB.  I will get a stress myoview to result out ischemia.  2D echo to evaluate LVF   Current medicines are reviewed at length with the patient today.  The patient does not have concerns regarding medicines.  The following changes have been made:  no change  Labs/ tests ordered today: See above Assessment and Plan No orders of the defined types were placed in this encounter.     Disposition:   FU with me in 1 year  Signed, Sueanne Margarita, MD  09/16/2014 1:52 PM    [facility] [address], [place], [place]  [address] Phone: [phone]; Fax: [phone]

## 2014-09-17 ENCOUNTER — Telehealth (HOSPITAL_COMMUNITY): Payer: Self-pay | Admitting: *Deleted

## 2014-09-17 NOTE — Telephone Encounter (Signed)
Left message on voicemail in reference to upcoming appointment scheduled for 09/19/14. Phone number given for a call back so details instructions can be given. [name], Ranae Palms

## 2014-09-18 ENCOUNTER — Telehealth (HOSPITAL_COMMUNITY): Payer: Self-pay

## 2014-09-18 NOTE — Telephone Encounter (Signed)
Patient given detailed instructions per Myocardial Perfusion Study Information Sheet for test on 09-19-2014 at 8:30am. Patient Notified to arrive 15 minutes early, and that it is imperative to arrive on time for appointment to keep from having the test rescheduled. Patient verbalized understanding. [name], [name] A

## 2014-09-18 NOTE — Telephone Encounter (Signed)
Left message on voicemail in reference to upcoming appointment scheduled for 09-19-2014. Phone number given for a call back so details instructions can be given. [name], Faron Tudisco A

## 2014-09-19 ENCOUNTER — Encounter: Payer: Self-pay | Admitting: Internal Medicine

## 2014-09-19 ENCOUNTER — Ambulatory Visit (HOSPITAL_COMMUNITY): Payer: Medicare Other | Attending: Cardiology

## 2014-09-19 ENCOUNTER — Other Ambulatory Visit (HOSPITAL_COMMUNITY): Payer: Self-pay

## 2014-09-19 DIAGNOSIS — I493 Ventricular premature depolarization: Secondary | ICD-10-CM | POA: Diagnosis not present

## 2014-09-19 DIAGNOSIS — R0602 Shortness of breath: Secondary | ICD-10-CM | POA: Diagnosis not present

## 2014-09-19 LAB — MYOCARDIAL PERFUSION IMAGING
LV dias vol: 74 mL
LV sys vol: 22 mL
Peak HR: 133 {beats}/min
RATE: 0.33
Rest HR: 70 {beats}/min
SDS: 4
SRS: 1
SSS: 5
TID: 1.03

## 2014-09-19 MED ORDER — REGADENOSON 0.4 MG/5ML IV SOLN
0.4000 mg | Freq: Once | INTRAVENOUS | Status: AC
  Administered 2014-09-19: 0.4 mg via INTRAVENOUS

## 2014-09-19 MED ORDER — AMINOPHYLLINE 25 MG/ML IV SOLN
75.0000 mg | Freq: Once | INTRAVENOUS | Status: AC
  Administered 2014-09-19: 75 mg via INTRAVENOUS

## 2014-09-19 MED ORDER — TECHNETIUM TC 99M SESTAMIBI GENERIC - CARDIOLITE
32.4000 | Freq: Once | INTRAVENOUS | Status: AC | PRN
  Administered 2014-09-19: 32 via INTRAVENOUS

## 2014-09-19 MED ORDER — TECHNETIUM TC 99M SESTAMIBI GENERIC - CARDIOLITE
10.3000 | Freq: Once | INTRAVENOUS | Status: AC | PRN
  Administered 2014-09-19: 10 via INTRAVENOUS

## 2014-09-26 ENCOUNTER — Other Ambulatory Visit (HOSPITAL_COMMUNITY): Payer: Self-pay

## 2014-09-26 ENCOUNTER — Telehealth: Payer: Self-pay | Admitting: *Deleted

## 2014-09-26 MED ORDER — ROPINIROLE HCL 0.5 MG PO TABS: ORAL_TABLET | ORAL | Status: DC

## 2014-09-26 NOTE — Telephone Encounter (Signed)
Refill sent in. Nothing further needed 

## 2014-09-26 NOTE — Telephone Encounter (Signed)
Received refill request on patient requip 0.5 mg take 1 tab late afternoon and 2 tabs after dinner. Last refilled by [facility] 02/21/14 #90 x 5 refills Last OV 06/21/14 and told to follow up in 1 year. Pt not established with new physician. Please advise Dr. Halford Chessman if okay to refill? thanks

## 2014-09-26 NOTE — Telephone Encounter (Signed)
Okay to send refill for requip. 

## 2014-10-16 ENCOUNTER — Other Ambulatory Visit (HOSPITAL_COMMUNITY): Payer: Self-pay

## 2014-10-29 ENCOUNTER — Telehealth (HOSPITAL_COMMUNITY): Payer: Self-pay | Admitting: *Deleted

## 2014-10-29 NOTE — Telephone Encounter (Signed)
Medication request for 90 day refill of Lamotrigine. Will notify office of request.

## 2014-11-01 ENCOUNTER — Other Ambulatory Visit: Payer: Self-pay

## 2014-11-01 DIAGNOSIS — Z1231 Encounter for screening mammogram for malignant neoplasm of breast: Secondary | ICD-10-CM

## 2014-11-06 ENCOUNTER — Ambulatory Visit (INDEPENDENT_AMBULATORY_CARE_PROVIDER_SITE_OTHER): Payer: 59 | Admitting: Psychiatry

## 2014-11-06 VITALS — BP 122/78 | HR 72 | Ht 64.25 in | Wt 214.8 lb

## 2014-11-06 DIAGNOSIS — F3130 Bipolar disorder, current episode depressed, mild or moderate severity, unspecified: Secondary | ICD-10-CM

## 2014-11-06 MED ORDER — LAMOTRIGINE 200 MG PO TABS: 200.0000 mg | ORAL_TABLET | Freq: Every day | ORAL | Status: DC

## 2014-11-06 MED ORDER — METHYLPHENIDATE HCL 10 MG PO TABS
10.0000 mg | ORAL_TABLET | Freq: Once | ORAL | Status: DC
Start: 2014-11-06 — End: 2014-12-05

## 2014-11-06 NOTE — Progress Notes (Signed)
[facility] MD Progress Note  11/06/2014 1:53 PM [name]  MRN:  [id number] Subjective:  Ms Mckoy says she has been more depressed for the last couple of months after having a very good year.  Her boyfriend has had some neurological issues that seem to have affected his personality and he is less thoughtful and more forgetful.  Regardless she has been more tired, no interest in taking care of the apartment, no motivation to go out, wanting to stay in bed all day and crying spells.  The apartment is too small for 2 people she says and they might move to his house which has 3 bedrooms. She is having symptoms she had when she was diagnosed with adult ADHD (forgetfulness, procrastination, cannot focus on reading or tasks, does not finish things, gets distracted).Adderall helped in the past. She believes if she could muster the energy to get some things done she could deal with the rest of the depression.  She also has had joint pain in all her joints which she has never had before and wonders if that comes from the depression.Principal Problem: depression Diagnosis:   Patient Active Problem List   Diagnosis Date Noted  . PTSD (post-traumatic stress disorder) [F43.10] 01/01/2014  . Bipolar I disorder, most recent episode depressed [F31.30] 01/01/2014  . MDD (major depressive disorder), recurrent severe, without psychosis [F33.2] 09/25/2013  . PVC's (premature ventricular contractions) [I49.3] 05/31/2013  . Diastolic dysfunction, left ventricle [I51.9] 05/31/2013  . PAT (paroxysmal atrial tachycardia) [I47.1] 05/31/2013  . Essential hypertension, benign [I10] 05/31/2013  . Obesity [E66.9] 05/31/2013  . Major depressive disorder, recurrent episode, severe, without mention of psychotic behavior [F33.2] 04/28/2013  . Recurrent major depression-severe [F33.2] 04/27/2013  . Generalized anxiety disorder [F41.1] 06/30/2012  . Renal mass [N28.89] 02/15/2012  . GALLSTONES [K80.20] 12/31/2009  . ATTENTION  DEFICIT DISORDER [F90.9] 02/20/2007  . Obstructive sleep apnea [G47.33] 02/20/2007  . RESTLESS LEGS SYNDROME [G25.81] 02/20/2007   Total Time spent with patient: 30 minutes   Past Medical History:  Past Medical History  Diagnosis Date  . Abscess of Bartholin's gland   . History of pleural effusion   . History of attention deficit disorder   . History of endometriosis   . RLS (restless legs syndrome)   . Anxiety   . Obesity   . GERD (gastroesophageal reflux disease)   . Renal mass 02/15/2012  . Depression     Bipolar disorder/goes to [facility] for meds  . OSA (obstructive sleep apnea)     uses oral appliance instead of CPAP  . Vertigo   . Chronic kidney disease     kidney cancer- pt states she has elected to not have it treated.  Marland Kitchen PONV (postoperative nausea and vomiting)   . Difficult intubation     told by [facility] that she was hard to intubate 15b yrs ago in [place]- surgery since then no problems  . Adverse effect of general anesthetic     felt paralyzed while receiving anesthesia  . Bipolar 1 disorder   . PTSD (post-traumatic stress disorder)   . ADHD (attention deficit hyperactivity disorder)   . Migraines   . rt renal ca dx'd 12/2009    no treatment/ no surg  . Acute vestibular neuronitis     Dr Lucia Gaskins  . RLS (restless legs syndrome)     Dr Gwenette Greet  . H/O echocardiogram 07/2012    Normla LVF w grade I siastolic dysfunction   . PVC's (premature ventricular contractions)   .  Hypertension   . 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 Dysrhythmia     h/o atrial tachycardia- s/p ablation     Past Surgical History  Procedure Laterality Date  . Thyroidectomy  1990  . Tubal ligation  1970s  . Nasoseptal reconstruction  1990s  . Cardiac electrophysiology mapping and ablation  2000s  . Tonsillectomy      as a child  . Uterine mass removal  03/2012    was found to be benign   Family History:  Family History  Problem Relation Age of  Onset  . Allergies Father   . Skin cancer Father   . Bipolar disorder Father   . Alcohol abuse Father   . Allergies Sister     multiple  . Allergies Brother     multiple  . Allergies Daughter   . Heart disease Mother   . Depression Mother   . Hypertension Mother   . CAD Mother   . Heart disease Maternal Grandfather   . Colon cancer Paternal Grandmother   . OCD Paternal Grandmother   . Cancer Paternal Grandfather    Social History:  History  Alcohol Use No     History  Drug Use No    Social History   Social History  . Marital Status: Single    Spouse Name: N/A  . Number of Children: N/A  . Years of Education: N/A   Occupational History  . 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 Smoking status: Never Smoker   . Smokeless tobacco: Never Used  . Alcohol Use: No  . Drug Use: No  . Sexual Activity: Yes    Birth Control/ Protection: None   Other Topics Concern  . Not on file   Social History Narrative   Divorced and lives alone   Has children   Additional History:    Sleep: Poor  Appetite:  Poor   Assessment: depression has worsened probably precipitated by the change in her boyfriend.  The ADHD like behaviors may be just from the depression itself.  She stills feels much happier having the female friend there than not.  Musculoskeletal: Strength & Muscle Tone: within normal limits Gait & Station: normal Patient leans: N/A   Psychiatric Specialty Exam: Physical Exam  ROS  Blood pressure 122/78, pulse 72, height 5' 4.25" (1.632 m), weight 214 lb 12.8 oz (97.433 kg).Body mass index is 36.58 kg/(m^2).  General Appearance: Well Groomed  Engineer, water::  Good  Speech:  Clear and Coherent  Volume:  Normal  Mood:  Depressed  Affect:  Congruent  Thought Process:  Coherent and Logical  Orientation:  Full (Time, Place, and Person)  Thought Content:  Negative  Suicidal Thoughts:  No  Homicidal Thoughts:  No  Memory:   Immediate;   Good Recent;   Good Remote;   Good  Judgement:  Good  Insight:  Good  Psychomotor Activity:  Normal  Concentration:  Good  Recall:  Good  Fund of Knowledge:Good  Language: Good  Akathisia:  Negative  Handed:  Right  AIMS (if indicated):     Assets:  Communication Skills Desire for Improvement Financial Resources/Insurance Housing Intimacy Leisure Time Physical Health Resilience Social Support Talents/Skills Transportation Vocational/Educational  ADL's:  Intact  Cognition: WNL  Sleep:        Current Medications: Current Outpatient Prescriptions  Medication Sig Dispense Refill  . aspirin 81 MG EC tablet Take 1  tablet (81 mg total) by mouth daily. For heart health 30 tablet 1  . buPROPion (WELLBUTRIN XL) 300 MG 24 hr tablet Take 1 tablet (300 mg total) by mouth daily. For depression (Patient taking differently: Take 300 mg by mouth daily. For depression and bipolar disorder) 30 tablet 5  . butalbital-acetaminophen-caffeine (FIORICET, ESGIC) 50-325-40 MG per tablet Take 1 tablet by mouth every 6 (six) hours as needed for headache.   0  . esomeprazole (NEXIUM) 40 MG capsule Take 40 mg by mouth as needed. GERD  3  . FLUoxetine (PROZAC) 20 MG capsule Take 1 capsule (20 mg total) by mouth daily. (Patient taking differently: Take 20 mg by mouth daily. ) 30 capsule 5  . fluticasone (FLONASE) 50 MCG/ACT nasal spray Place 1 spray into both nostrils as needed. Sinus problems  2  . lamoTRIgine (LAMICTAL) 200 MG tablet Take 1 tablet (200 mg total) by mouth daily. 30 tablet 5  . levothyroxine (SYNTHROID, LEVOTHROID) 88 MCG tablet Take 1 tablet (88 mcg total) by mouth daily before breakfast. For low thyroid function    . LORazepam (ATIVAN) 1 MG tablet Take 1 tablet (1 mg total) by mouth 2 (two) times daily as needed for anxiety. 60 tablet 3  . methylphenidate (RITALIN) 10 MG tablet Take 1 tablet (10 mg total) by mouth once. 30 tablet 0  . nebivolol (BYSTOLIC) 2.5 MG tablet  Take 1 tablet (2.5 mg total) by mouth daily. 90 tablet 3  . rOPINIRole (REQUIP) 0.5 MG tablet TAKE 1 TABLET BY MOUTH LATE IN THE AFTERNOON AND 2 TABS AFTER DINNER 90 tablet 5  . valsartan-hydrochlorothiazide (DIOVAN-HCT) 320-25 MG per tablet Take 1 tablet by mouth daily. High blood pressure control 30 tablet 0   No current facility-administered medications for this visit.    Lab Results: No results found for this or any previous visit (from the past 48 hour(s)).  Physical Findings: AIMS:  , ,  ,  ,    CIWA:    COWS:     Treatment Plan Summary: continue current medications. add methylphenidate 5 mg daily for one month to see if it can dislodge her lackof energy   Medical Decision Making:  Established Problem, Worsening (2)     [name] 11/06/2014, 1:53 PM

## 2014-11-07 ENCOUNTER — Telehealth (HOSPITAL_COMMUNITY): Payer: Self-pay | Admitting: Psychiatry

## 2014-11-07 NOTE — Telephone Encounter (Signed)
Recommended she take 5 mg methylphenidate up to 3 times daily as needed

## 2014-11-18 ENCOUNTER — Ambulatory Visit (HOSPITAL_COMMUNITY): Payer: Self-pay | Admitting: Psychiatry

## 2014-11-18 ENCOUNTER — Telehealth (HOSPITAL_COMMUNITY): Payer: Self-pay | Admitting: Psychiatry
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
## 2014-11-19 ENCOUNTER — Ambulatory Visit (INDEPENDENT_AMBULATORY_CARE_PROVIDER_SITE_OTHER): Payer: 59 | Admitting: Psychiatry

## 2014-11-19 DIAGNOSIS — F3130 Bipolar disorder, current episode depressed, mild or moderate severity, unspecified: Secondary | ICD-10-CM | POA: Diagnosis not present
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
## 2014-12-05 ENCOUNTER — Ambulatory Visit (INDEPENDENT_AMBULATORY_CARE_PROVIDER_SITE_OTHER): Payer: 59 | Admitting: Psychiatry

## 2014-12-05 ENCOUNTER — Encounter (HOSPITAL_COMMUNITY): Payer: Self-pay | Admitting: Psychiatry

## 2014-12-05 VITALS — BP 133/68 | HR 67 | Ht 65.0 in | Wt 212.0 lb

## 2014-12-05 DIAGNOSIS — F4310 Post-traumatic stress disorder, unspecified: Secondary | ICD-10-CM | POA: Diagnosis not present

## 2014-12-05 DIAGNOSIS — F3130 Bipolar disorder, current episode depressed, mild or moderate severity, unspecified: Secondary | ICD-10-CM

## 2014-12-05 MED ORDER — METHYLPHENIDATE HCL 10 MG PO TABS: 5.0000 mg | ORAL_TABLET | Freq: Once | ORAL | Status: DC

## 2014-12-05 MED ORDER — BUPROPION HCL ER (XL) 300 MG PO TB24: 300.0000 mg | ORAL_TABLET | Freq: Every day | ORAL | Status: DC

## 2014-12-05 MED ORDER — LORAZEPAM 1 MG PO TABS: 1.0000 mg | ORAL_TABLET | Freq: Two times a day (BID) | ORAL | Status: DC | PRN

## 2014-12-05 MED ORDER — FLUOXETINE HCL 40 MG PO CAPS: 40.0000 mg | ORAL_CAPSULE | Freq: Every day | ORAL | Status: DC

## 2014-12-05 MED ORDER — LAMOTRIGINE 200 MG PO TABS: 200.0000 mg | ORAL_TABLET | Freq: Every day | ORAL | Status: DC

## 2014-12-05 NOTE — Progress Notes (Signed)
Patient ID: [name], female   DOB: 28-May-1945, 69 y.o.   MRN: [id number]  [place] [id number] Progress Note  Brooke WESTERGREN [id number] 69 y.o.  12/05/2014 3:55 PM  Chief Complaint: "not doing well"  History of Present Illness: States she is slowly sliding downhill. Reports depression is getting worse. She was given Ritalin by Dr. Lovena Le a few weeks ago and it helps for 3-4 hrs/day.  Reports depressed mood, anhedonia, low motivation, crying , isolation, poor self care, worthlessness and hopelessness. Pt is not doing her chores. Pt feels very restless. Symptoms have been worsening over last few months. States it is difficult to live her boyfriend but he has emotional issues that she can't deal with right now. States it is a big stressor for her.  Wellbutrin and Prozac are no longer helping. States this is the time of her year her SAD starts usually.    Sleep is broken but she is getting about 10+ hrs/day. Appetite is poor and has lost 8 lbs but is happy about the weight loss. Energy is low and she naps a lot.  Denies manic and hypomanic symptoms including periods of decreased need for sleep, increased energy, impulsivity, FOI, and excessive spending. States mood was labile last week.   PTSD- HV and startle reaction are increased. She does experience a lot of anger towards her father. Pt is having nightmares and intrusive memories.   Anxiety is tolerable and Bystolic and Ativan are helping.   Wellbutrin, Lamictal, and Prozac are effective and denies SE.   Pt moved to a less expensive apartment.   Suicidal Ideation: No Plan Formed: No Patient has means to carry out plan: No  Homicidal Ideation: No Plan Formed: No Patient has means to carry out plan: No  Review of Systems: Psychiatric: Agitation: No Hallucination: No Depressed Mood: Yes Insomnia: No Hypersomnia: Yes Altered Concentration: Yes but she takes Ritalin and it helps Feels Worthless: Yes Grandiose  Ideas: No Belief In Special Powers: No New/Increased Substance Abuse: No Compulsions: No  Review of Systems  Constitutional: Positive for malaise/fatigue. Negative for fever and chills.  HENT: Negative for congestion, ear pain and sore throat.   Eyes: Negative for blurred vision, double vision and redness.  Respiratory: Negative for cough, sputum production and shortness of breath.   Cardiovascular: Negative for chest pain, palpitations and leg swelling.  Gastrointestinal: Negative for heartburn, nausea, vomiting and abdominal pain.  Musculoskeletal: Negative for back pain, joint pain and neck pain.  Skin: Negative for itching and rash.  Neurological: Negative for dizziness, tingling, seizures, loss of consciousness and headaches.  Psychiatric/Behavioral: Positive for depression. Negative for suicidal ideas, hallucinations and substance abuse. The patient is not nervous/anxious and does not have insomnia.    Neurologic: Headache: Yes Seizure: No Paresthesias: No  Past Medical Family, Social History: Pt is divorced and living in [place]. Pt lives alone. Unemployed. She has one daughter.  reports that she has never smoked. She has never used smokeless tobacco. She reports that she does not drink alcohol or use illicit drugs.  Family History  Problem Relation Age of Onset  . Allergies Father   . Skin cancer Father   . Bipolar disorder Father   . Alcohol abuse Father   . Allergies Sister     multiple  . Allergies Brother     multiple  . Allergies Daughter   . Heart disease Mother   . Depression Mother   . Hypertension Mother   . CAD Mother   .  Heart disease Maternal Grandfather   . Colon cancer Paternal Grandmother   . OCD Paternal Grandmother   . Cancer Paternal Grandfather    Past Medical History  Diagnosis Date  . Abscess of Bartholin's gland   . History of pleural effusion   . History of attention deficit disorder   . History of endometriosis   . RLS (restless  legs syndrome)   . Anxiety   . Obesity   . GERD (gastroesophageal reflux disease)   . Renal mass 02/15/2012  . Depression     Bipolar disorder/goes to [facility] for meds  . OSA (obstructive sleep apnea)     uses oral appliance instead of CPAP  . Vertigo   . Chronic kidney disease     kidney cancer- pt states she has elected to not have it treated.  [name] PONV (postoperative nausea and vomiting)   . Difficult intubation     told by [facility] that she was hard to intubate 15b yrs ago in [place]- surgery since then no problems  . Adverse effect of general anesthetic     felt paralyzed while receiving anesthesia  . Bipolar 1 disorder   . PTSD (post-traumatic stress disorder)   . ADHD (attention deficit hyperactivity disorder)   . Migraines   . rt renal ca dx'd 12/2009    no treatment/ no surg  . Acute vestibular neuronitis     Dr Lucia Gaskins  . RLS (restless legs syndrome)     Dr Gwenette Greet  . H/O echocardiogram 07/2012    Normla LVF w grade I siastolic dysfunction   . PVC's (premature ventricular contractions)   . Hypertension   . 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 Dysrhythmia     h/o atrial tachycardia- s/p ablation      Outpatient Encounter Prescriptions as of 12/05/2014  Medication Sig  . aspirin 81 MG EC tablet Take 1 tablet (81 mg total) by mouth daily. For heart health  . buPROPion (WELLBUTRIN XL) 300 MG 24 hr tablet Take 1 tablet (300 mg total) by mouth daily. For depression (Patient taking differently: Take 300 mg by mouth daily. For depression and bipolar disorder)  . butalbital-acetaminophen-caffeine (FIORICET, ESGIC) 50-325-40 MG per tablet Take 1 tablet by mouth every 6 (six) hours as needed for headache.   . esomeprazole (NEXIUM) 40 MG capsule Take 40 mg by mouth as needed. GERD  . FLUoxetine (PROZAC) 20 MG capsule Take 1 capsule (20 mg total) by mouth daily. (Patient taking differently: Take 20 mg by mouth daily. )  . fluticasone (FLONASE) 50  MCG/ACT nasal spray Place 1 spray into both nostrils as needed. Sinus problems  . lamoTRIgine (LAMICTAL) 200 MG tablet Take 1 tablet (200 mg total) by mouth daily.  [name] levothyroxine (SYNTHROID, LEVOTHROID) 88 MCG tablet Take 1 tablet (88 mcg total) by mouth daily before breakfast. For low thyroid function  . LORazepam (ATIVAN) 1 MG tablet Take 1 tablet (1 mg total) by mouth 2 (two) times daily as needed for anxiety.  . methylphenidate (RITALIN) 10 MG tablet Take 1 tablet (10 mg total) by mouth once. (Patient taking differently: Take 5 mg by mouth once. )  . nebivolol (BYSTOLIC) 2.5 MG tablet Take 1 tablet (2.5 mg total) by mouth daily.  [name] rOPINIRole (REQUIP) 0.5 MG tablet TAKE 1 TABLET BY MOUTH LATE IN THE AFTERNOON AND 2 TABS AFTER DINNER  . valsartan-hydrochlorothiazide (DIOVAN-HCT) 320-25 MG per tablet Take 1 tablet by mouth daily. High blood  pressure control   No facility-administered encounter medications on file as of 12/05/2014.    Past Psychiatric History/Hospitalization(s): Anxiety: Yes Bipolar Disorder: Yes Depression: Yes Mania: Yes Psychosis: No Schizophrenia: No Personality Disorder: No Hospitalization for psychiatric illness: Yes History of Electroconvulsive Shock Therapy: No Prior Suicide Attempts: No  Physical Exam: Constitutional:  BP 133/68 mmHg  Pulse 67  Ht 5\' 5"  (1.651 m)  Wt 212 lb (96.163 kg)  BMI 35.28 kg/m2  General Appearance: alert, oriented, no acute distress  Musculoskeletal: Strength & Muscle Tone: within normal limits Gait & Station: normal Patient leans: N/A  Mental Status Examination/Evaluation: Objective: Attitude: Calm and cooperative  Appearance: Fairly Groomed, appears to be stated age  Engineer, water::  Fair  Speech:  Clear and Coherent and Normal Rate  Volume:  Normal  Mood:  depressed  Affect:  Depressed and Tearful  Thought Process:  Intact, Linear and Logical  Orientation:  Full (Time, Place, and Person)  Thought Content:  Negative   Suicidal Thoughts:  No  Homicidal Thoughts:  No  Judgement:  Fair  Insight:  Fair  Concentration: good  Memory: Immediate-intact Recent-intact Remote-intact  Recall: fair  Language: fair  Gait and Station: normal  [employer] of Knowledge: average  Psychomotor Activity:  Normal  Akathisia:  No  Handed:  Right  AIMS (if indicated):  n/a      Medical Decision Making (Choose Three): Review of Psycho-Social Stressors (1), Established Problem, Worsening (2), Review of Medication Regimen & Side Effects (2) and Review of New Medication or Change in Dosage (2)  Assessment: Axis I: Bipolar d/o- current episode depressed;  PTSD Axis II: deferred Axis III:  Past Medical History  Diagnosis Date  . Abscess of Bartholin's gland   . History of pleural effusion   . History of attention deficit disorder   . History of endometriosis   . RLS (restless legs syndrome)   . Anxiety   . Obesity   . GERD (gastroesophageal reflux disease)   . Renal mass 02/15/2012  . Depression     Bipolar disorder/goes to [facility] for meds  . OSA (obstructive sleep apnea)     uses oral appliance instead of CPAP  . Vertigo   . Chronic kidney disease     kidney cancer- pt states she has elected to not have it treated.  [name] PONV (postoperative nausea and vomiting)   . Difficult intubation     told by [facility] that she was hard to intubate 15b yrs ago in [place]- surgery since then no problems  . Adverse effect of general anesthetic     felt paralyzed while receiving anesthesia  . Bipolar 1 disorder   . PTSD (post-traumatic stress disorder)   . ADHD (attention deficit hyperactivity disorder)   . Migraines   . rt renal ca dx'd 12/2009    no treatment/ no surg  . Acute vestibular neuronitis     Dr Lucia Gaskins  . RLS (restless legs syndrome)     Dr Gwenette Greet  . H/O echocardiogram 07/2012    Normla LVF w grade I siastolic dysfunction   . PVC's (premature ventricular contractions)   . Hypertension   .  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 Dysrhythmia     h/o atrial tachycardia- s/p ablation    Axis IV: poor coping skills  Axis V: GAF 60   Plan:  Ativan 1mg  BID prn anxiety Lamictal 200mg  po qD (Pt was only taking 150mg  previously) for  mood stabalization Continue Wellbutrin XL 300mg  po qD for depression Continue Prozac 20mg  po qD for depression and anxiety and PTSD Continue Ritalin 5mg  po qD for mood augmenation  Medication management with supportive therapy. Risks/benefits and SE of the medication discussed. Pt verbalized understanding and verbal consent obtained for treatment.  Affirm with the patient that the medications are taken as ordered. Patient expressed understanding of how their medications were to be used.  -worsening of symptoms  Recommended pt obtain light box or sitting in direct sunlight for 1-2 hrs/day  Reviewed labs from 02/07/2014: CBC WNL, CMP WNL,     Therapy: brief supportive therapy provided. Discussed psychosocial stressors in detail.  Encouraged to continue individual therapy with Anderson Malta.   Pt denies SI and is at an acute low risk for suicide.Patient told to call clinic if any problems occur. Patient advised to go to ER if they should develop SI/HI, side effects, or if symptoms worsen. Has crisis numbers to call if needed. Pt verbalized understanding.  F/up in 2 months or sooner if needed  [name], MD 12/05/2014

## 2014-12-06 ENCOUNTER — Ambulatory Visit (HOSPITAL_COMMUNITY): Payer: Self-pay | Admitting: Psychiatry

## 2014-12-09 ENCOUNTER — Ambulatory Visit (HOSPITAL_COMMUNITY): Payer: Self-pay | Admitting: Psychiatry

## 2014-12-10 ENCOUNTER — Ambulatory Visit (INDEPENDENT_AMBULATORY_CARE_PROVIDER_SITE_OTHER): Payer: Medicaid Other | Admitting: Psychiatry

## 2014-12-10 DIAGNOSIS — F411 Generalized anxiety disorder: Secondary | ICD-10-CM

## 2014-12-10 DIAGNOSIS — F4310 Post-traumatic stress disorder, unspecified: Secondary | ICD-10-CM

## 2014-12-10 DIAGNOSIS — F901 Attention-deficit hyperactivity disorder, predominantly hyperactive type: Secondary | ICD-10-CM

## 2014-12-10 DIAGNOSIS — F39 Unspecified mood [affective] disorder: Secondary | ICD-10-CM

## 2014-12-10 DIAGNOSIS — F332 Major depressive disorder, recurrent severe without psychotic features: Secondary | ICD-10-CM | POA: Diagnosis not present

## 2014-12-12 NOTE — Progress Notes (Signed)
   THERAPIST PROGRESS NOTE  Session Time: 3:05-3:55  Participation Level: Active   Behavioral Response: CasualAlertEuthymic  Type of Therapy: Individual Therapy   Treatment Goals addressed: Anxiety   Interventions: CBT, supportive  Summary: [name] is a 69 y.o. female who presents with anxiety and depression.   Suicidal/Homicidal: Nowithout intent/plan   Therapist Response:Pt. Presents with improved mood compared to last session. Pt. Reports that she continues to take the stimulant that was prescribed by Dr. Lovena Le. Pt. Reports that the stimulant has helped her with focus and productivity during the day and that the anxiety that she experiences 3-4 hours after taking the stimulant is manageable. Pt. Continues to report that her relationship with her significant other is major source of stress; however, Pt. Continues to set healthy contact boundaries. Pt. Reports that her partner has reduced his dependence on sedatives and is making progress in cleaning his home and mood is more stable. Pt. Discussed challenge of communicating her needs in intimate relationship. Significant part of session focused on Pt.'s grief related to loss of family relationships. Pt. Is also grieving emotional distance/loss of connection with several good friends. Session focused on identifying strengths and awareness of worthiness of love and connection with significant people in her life.   Plan: Pt. To continue with CBT based therapy. Return again in 1 week.   Diagnosis: Axis I: Depressive Disorder NOS    Nancie Neas, [facility] 12/12/2014

## 2014-12-13 ENCOUNTER — Ambulatory Visit
Admission: RE | Admit: 2014-12-13 | Discharge: 2014-12-13 | Disposition: A | Discharge status: Home / Self Care | Payer: Medicare Other | Source: Ambulatory Visit

## 2014-12-13 DIAGNOSIS — Z1231 Encounter for screening mammogram for malignant neoplasm of breast: Secondary | ICD-10-CM

## 2014-12-20 ENCOUNTER — Ambulatory Visit (HOSPITAL_COMMUNITY): Payer: Self-pay | Admitting: Psychiatry

## 2015-01-02 ENCOUNTER — Ambulatory Visit (HOSPITAL_COMMUNITY): Payer: Self-pay | Admitting: Psychiatry

## 2015-01-02 ENCOUNTER — Ambulatory Visit (INDEPENDENT_AMBULATORY_CARE_PROVIDER_SITE_OTHER): Payer: 59 | Admitting: Psychiatry

## 2015-01-02 DIAGNOSIS — F3130 Bipolar disorder, current episode depressed, mild or moderate severity, unspecified: Secondary | ICD-10-CM

## 2015-01-03 NOTE — Progress Notes (Signed)
   THERAPIST PROGRESS NOTE  Session Time: 3:05-4:00  Participation Level: Active   Behavioral Response: CasualAlertEuthymic  Type of Therapy: Individual Therapy   Treatment Goals addressed: Anxiety   Interventions: CBT, supportive  Summary: [name] is a 69 y.o. female who presents with anxiety and depression.   Suicidal/Homicidal: Nowithout intent/plan   Therapist Response:Pt. Continues to present with euthymic mood. Pt. Reports that her boyfriend asked her to marry him since our last session. Pt. Accepted the proposal, but no immediate plans. Pt. Reports that not sure how her life will change after the marriage, but she is looking forward to spending the rest of her life with him. Pt. Reports that she has managed stress this week by turning on music and dancing around her house. Pt. Also continues to manage stress by setting healthy boundaries with her boyfriend. Session focused on recognizing sensory triggers i.e., sound, clutter, touch, understanding how being overwhelmed by her environment affects her mood and setting limits so that she can maintain balance.   Plan: Pt. To continue with CBT based therapy. Return again in 1 week.   Diagnosis: Axis I: Depressive Disorder NOS    Nancie Neas, [facility] 01/03/2015

## 2015-01-06 ENCOUNTER — Ambulatory Visit: Payer: Self-pay | Admitting: Adult Health

## 2015-01-07 ENCOUNTER — Telehealth: Payer: Self-pay | Admitting: Oncology

## 2015-01-07 ENCOUNTER — Ambulatory Visit (HOSPITAL_COMMUNITY): Payer: Self-pay | Admitting: Psychiatry

## 2015-01-07 NOTE — Telephone Encounter (Signed)
Lvm advising appt chg from 11/17 to 11/29 @ 3.30pm. Also mailed appt calendar.

## 2015-01-10 ENCOUNTER — Encounter: Payer: Self-pay | Admitting: Adult Health

## 2015-01-10 ENCOUNTER — Ambulatory Visit (INDEPENDENT_AMBULATORY_CARE_PROVIDER_SITE_OTHER): Payer: Medicare Other | Admitting: Adult Health

## 2015-01-10 VITALS — BP 118/78 | HR 63 | Temp 98.8°F | Ht 65.0 in | Wt 212.0 lb

## 2015-01-10 DIAGNOSIS — G2581 Restless legs syndrome: Secondary | ICD-10-CM

## 2015-01-10 DIAGNOSIS — G4733 Obstructive sleep apnea (adult) (pediatric): Secondary | ICD-10-CM

## 2015-01-10 NOTE — Assessment & Plan Note (Signed)
Suspect pt underlying untreated OSA may be causing more RLS symptoms  She is aggreeable to new sleep study .   Plan  We are going to set you up for a Sleep study.  Work on weight loss  Follow up in 4-6 weeks and As needed

## 2015-01-10 NOTE — Patient Instructions (Signed)
We are going to set you up for a Sleep study.  Work on weight loss May try to decrease Requip dose to 1 in afternoon and 1 at bedtime Work on daily exercise.  This is a great way to treat your restless legs. Follow up in 4-6 weeks and As needed

## 2015-01-10 NOTE — Progress Notes (Signed)
   Subjective:    Patient ID: [name], female    DOB: October 24, 1945, 69 y.o.   MRN: [id number]  HPI 69 year old female previous patient of Dr. Gwenette Greet followed for restless leg syndrome and sleep apnea  TEST  NPSG 2004:  AHI 10/hr  01/10/2015 Acute OV : RLS/OSA  Patient presents for an acute office visit. Patient has had known restless leg syndrome. Says over the last few months that she's been having increased symptoms of restless leg. She says that her legs jerk and feel uncomfortable, especially when she takes a nap and watches TV anytime that she is sitting still and right before bedtime. She is currently on Requip 0.5 mg in afternoon and 2 tablets at bedtime. She does have known sleep apnea with a previous sleep test in 2004 show an AHI at 10 . She was intolerant of C Pap and has been using a dental appliance. She does inform me that she has not been using this dental appliance for the last several months to year. She does feel tired during the day. Says she had anemia panel repeat this year that was normal . Last year lab review showed normal levels.    Review of Systems Constitutional:   No  weight loss, night sweats,  Fevers, chills, fatigue, or  lassitude.  HEENT:   No headaches,  Difficulty swallowing,  Tooth/dental problems, or  Sore throat,                No sneezing, itching, ear ache, nasal congestion, post nasal drip,   CV:  No chest pain,  Orthopnea, PND, swelling in lower extremities, anasarca, dizziness, palpitations, syncope.   GI  No heartburn, indigestion, abdominal pain, nausea, vomiting, diarrhea, change in bowel habits, loss of appetite, bloody stools.   Resp: No shortness of breath with exertion or at rest.  No excess mucus, no productive cough,  No non-productive cough,  No coughing up of blood.  No change in color of mucus.  No wheezing.  No chest wall deformity  Skin: no rash or lesions.  GU: no dysuria, change in color of urine, no urgency or  frequency.  No flank pain, no hematuria   MS:  No joint pain or swelling.  No decreased range of motion.  No back pain.  Psych:  No change in mood or affect.+ depression          Objective:   Physical Exam GEN: A/Ox3; pleasant , NAD, obese   HEENT:  [place]/AT,  EACs-clear, TMs-wnl, NOSE-clear, THROAT-clear, no lesions, no postnasal drip or exudate noted. Class 2 MP airway   NECK:  Supple w/ fair ROM; no JVD; normal carotid impulses w/o bruits; no thyromegaly or nodules palpated; no lymphadenopathy.  RESP  Clear  P & A; w/o, wheezes/ rales/ or rhonchi.no accessory muscle use, no dullness to percussion  CARD:  RRR, no m/r/g  , no peripheral edema, pulses intact, no cyanosis or clubbing.  GI:   Soft & nt; nml bowel sounds; no organomegaly or masses detected.  Musco: Warm bil, no deformities or joint swelling noted.   Neuro: alert, no focal deficits noted.    Skin: Warm, no lesions or rashes         Assessment & Plan:

## 2015-01-10 NOTE — Assessment & Plan Note (Signed)
?   OSA is contributing to worsening symptoms.  Also on moderate dose requip, ? Augmentation effect. Advised to try lower dose to see if helps  We are going to set you up for a Sleep study.  Work on weight loss May try to decrease Requip dose to 1 in afternoon and 1 at bedtime to see if helps.  Work on daily exercise.  This is a great way to treat your restless legs. Follow up in 4-6 weeks and As needed

## 2015-01-13 NOTE — Progress Notes (Signed)
Reviewed & agree with plan  

## 2015-01-15 ENCOUNTER — Other Ambulatory Visit (HOSPITAL_COMMUNITY): Payer: Self-pay | Admitting: Psychiatry

## 2015-01-21 ENCOUNTER — Ambulatory Visit (HOSPITAL_BASED_OUTPATIENT_CLINIC_OR_DEPARTMENT_OTHER): Payer: Medicare Other | Attending: Adult Health | Admitting: Radiology

## 2015-01-21 VITALS — Ht 65.0 in | Wt 212.0 lb

## 2015-01-21 DIAGNOSIS — G4733 Obstructive sleep apnea (adult) (pediatric): Secondary | ICD-10-CM

## 2015-01-21 NOTE — Sleep Study (Unsigned)
Pt was sick and went home.

## 2015-01-23 ENCOUNTER — Ambulatory Visit (INDEPENDENT_AMBULATORY_CARE_PROVIDER_SITE_OTHER): Payer: 59 | Admitting: Psychiatry

## 2015-01-23 DIAGNOSIS — F3130 Bipolar disorder, current episode depressed, mild or moderate severity, unspecified: Secondary | ICD-10-CM | POA: Diagnosis not present

## 2015-01-23 NOTE — Progress Notes (Signed)
   THERAPIST PROGRESS NOTE  Session Time: 3:05-4:00  Participation Level: Active   Behavioral Response: CasualAlertEuthymic  Type of Therapy: Individual Therapy   Treatment Goals addressed: Anxiety   Interventions: CBT, supportive  Summary: [name] is a 69 y.o. female who presents with anxiety and depression.   Suicidal/Homicidal: Nowithout intent/plan   Therapist Response:Pt. Presents as talkative, makes good eye contact, reports moderate anxiety. Pt. Reports generally feeling overwhelmed by daily tasks that she was not able to complete during October. Pt. Reports that she is also overwhelmed by her boyfriend's requests that she spend more time in his apartment, but has not been able to do so because of his disorganization. Pt. Was encouraged to continue to set healthy boundaries in the relationship as to avoid sensory overload, which she states is not a problem because her boyfriend his responsive to her spatial boundaries. Pt. Reports that she did well during August and September which is is able to relate to taking prescribed stimulant for ADHD. Pt. Reports that during the last month she stopped taking the stimulant because she believed that it stopped working. Pt. Participated in meditation grounding exercise to help to calm anxiety. Pt. Was given homework to write master to-do list for things that she needs to accomplish, then prioritize the list based on the items that are time sensitive i.e., bills that need to be paid by due dates, and to create daily to-do list with a maximum of one task for each day. Pt. Reports disappointment in herself for not ordering light for seasonal affective disorder. Pt. Reports that she has access to porch at home and would be able to eat her meals on her porch, do meditation exercises and to-do list outside to benefit from sunlight.   Plan: Pt. To continue with CBT based therapy. Return again in 2 week.   Diagnosis: Axis I: Depressive Disorder  NOS    Nancie Neas, [facility] 01/23/2015

## 2015-01-31 ENCOUNTER — Other Ambulatory Visit (HOSPITAL_COMMUNITY): Payer: Self-pay | Admitting: Psychiatry

## 2015-01-31 DIAGNOSIS — F3130 Bipolar disorder, current episode depressed, mild or moderate severity, unspecified: Secondary | ICD-10-CM

## 2015-02-03 ENCOUNTER — Other Ambulatory Visit (HOSPITAL_COMMUNITY): Payer: Self-pay | Admitting: Psychiatry

## 2015-02-03 NOTE — Telephone Encounter (Signed)
Met with Dr. Salem Senate in the absence of Dr. Doyne Keel this date to inform patient called with request for Wellbutrin XL refill stating she had been out 2 days.  New order authorized by Dr. Salem Senate and e-scribed order to patient's [facility] on [address] per approval of Dr. Salem Senate.  Called patient and left a message to inform a new one time order for Wellbutrin XL was e-scribed into her pharmacy and reminded patient of need to keep appointment on 02/06/15 with Dr. Doyne Keel for any further refills.

## 2015-02-06 ENCOUNTER — Ambulatory Visit (HOSPITAL_COMMUNITY): Payer: Self-pay | Admitting: Psychiatry

## 2015-02-06 ENCOUNTER — Ambulatory Visit: Payer: Self-pay | Admitting: Oncology

## 2015-02-06 ENCOUNTER — Other Ambulatory Visit: Payer: Self-pay

## 2015-02-07 ENCOUNTER — Ambulatory Visit: Payer: Self-pay | Admitting: Adult Health

## 2015-02-12 ENCOUNTER — Ambulatory Visit (HOSPITAL_COMMUNITY): Payer: Self-pay | Admitting: Psychiatry

## 2015-02-17 ENCOUNTER — Other Ambulatory Visit: Payer: Self-pay | Admitting: *Deleted

## 2015-02-17 DIAGNOSIS — N2889 Other specified disorders of kidney and ureter: Secondary | ICD-10-CM

## 2015-02-18 ENCOUNTER — Ambulatory Visit (HOSPITAL_BASED_OUTPATIENT_CLINIC_OR_DEPARTMENT_OTHER): Payer: Medicare Other | Admitting: Oncology

## 2015-02-18 ENCOUNTER — Telehealth: Payer: Self-pay | Admitting: Oncology

## 2015-02-18 ENCOUNTER — Other Ambulatory Visit (HOSPITAL_BASED_OUTPATIENT_CLINIC_OR_DEPARTMENT_OTHER): Payer: Medicare Other

## 2015-02-18 VITALS — BP 137/78 | HR 65 | Temp 98.6°F | Resp 18 | Ht 65.0 in | Wt 214.3 lb

## 2015-02-18 DIAGNOSIS — N2889 Other specified disorders of kidney and ureter: Secondary | ICD-10-CM

## 2015-02-18 DIAGNOSIS — N289 Disorder of kidney and ureter, unspecified: Secondary | ICD-10-CM | POA: Diagnosis not present

## 2015-02-18 DIAGNOSIS — F329 Major depressive disorder, single episode, unspecified: Secondary | ICD-10-CM

## 2015-02-18 LAB — CBC WITH DIFFERENTIAL/PLATELET
BASO%: 0.5 % (ref 0.0–2.0)
Basophils Absolute: 0 10*3/uL (ref 0.0–0.1)
EOS%: 2.6 % (ref 0.0–7.0)
Eosinophils Absolute: 0.1 10*3/uL (ref 0.0–0.5)
HCT: 42.2 % (ref 34.8–46.6)
HGB: 13.6 g/dL (ref 11.6–15.9)
LYMPH%: 27.9 % (ref 14.0–49.7)
MCH: 27.1 pg (ref 25.1–34.0)
MCHC: 32.2 g/dL (ref 31.5–36.0)
MCV: 84.1 fL (ref 79.5–101.0)
MONO#: 0.4 10*3/uL (ref 0.1–0.9)
MONO%: 7.5 % (ref 0.0–14.0)
NEUT#: 3.2 10*3/uL (ref 1.5–6.5)
NEUT%: 61.5 % (ref 38.4–76.8)
Platelets: 263 10*3/uL (ref 145–400)
RBC: 5.01 10*6/uL (ref 3.70–5.45)
RDW: 14.3 % (ref 11.2–14.5)
WBC: 5.2 10*3/uL (ref 3.9–10.3)
lymph#: 1.5 10*3/uL (ref 0.9–3.3)

## 2015-02-18 LAB — COMPREHENSIVE METABOLIC PANEL (CC13)
ALT: 16 U/L (ref 0–55)
AST: 20 U/L (ref 5–34)
Albumin: 3.8 g/dL (ref 3.5–5.0)
Alkaline Phosphatase: 89 U/L (ref 40–150)
Anion Gap: 7 mEq/L (ref 3–11)
BUN: 19.1 mg/dL (ref 7.0–26.0)
CO2: 29 mEq/L (ref 22–29)
Calcium: 10.2 mg/dL (ref 8.4–10.4)
Chloride: 104 mEq/L (ref 98–109)
Creatinine: 0.9 mg/dL (ref 0.6–1.1)
EGFR: 67 mL/min/{1.73_m2} — ABNORMAL LOW (ref >90)
Glucose: 73 mg/dl (ref 70–140)
Potassium: 4.2 mEq/L (ref 3.5–5.1)
Sodium: 139 mEq/L (ref 136–145)
Total Bilirubin: 0.37 mg/dL (ref 0.20–1.20)
Total Protein: 7.4 g/dL (ref 6.4–8.3)

## 2015-02-18 NOTE — Progress Notes (Signed)
Hematology and Oncology Follow Up Visit  [name] [id number] 1945-08-16 69 y.o. 02/18/2015 3:07 PM  CC: [name] ([name]) Harrington Challenger, M.D.    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  The patient refused a biopsy or primary treatment. Diagnosed in 2011.   Current therapy: Observation and surveillance. Followup CT scan in November of 2014 continue to show stable disease.  Interim History:  [name] presents today for a followup visit.  Since the last visit, she reports no new complaints. She continues to have excellent performance status and activity level and her quality of life remains excellent.   She did not report any hematuria or dysuria. She has not reported any back pain. She is reporting that her depression is much improved at this time and her mood is excellent.   She did report some occasional bloating and nausea and is following up with gastroenterology in the near future.    She does not report any headaches, blurry vision, syncope or seizures. She had not had any fevers or chills or sweats. Has not had any abdominal pain or change in her bowel habits. she does not report any lymphadenopathy or petechiae. She does report nausea but no vomiting. Rest of her review of systems unremarkable.  Medications: I have reviewed the patient's current medications. Current Outpatient Prescriptions  Medication Sig Dispense Refill  . aspirin 81 MG EC tablet Take 1 tablet (81 mg total) by mouth daily. For heart health 30 tablet 1  . buPROPion (WELLBUTRIN XL) 300 MG 24 hr tablet TAKE 1 TABLET (300 MG TOTAL) BY MOUTH DAILY. FOR DEPRESSION 30 tablet 0  . butalbital-acetaminophen-caffeine (FIORICET, ESGIC) 50-325-40 MG per tablet Take 1 tablet by mouth every 6 (six) hours as needed for headache.   0  . esomeprazole (NEXIUM) 40 MG capsule Take 40 mg by mouth as needed. GERD  3  . FLUoxetine  (PROZAC) 40 MG capsule Take 1 capsule (40 mg total) by mouth daily. 30 capsule 1  . fluticasone (FLONASE) 50 MCG/ACT nasal spray Place 1 spray into both nostrils as needed. Sinus problems  2  . lamoTRIgine (LAMICTAL) 200 MG tablet Take 1 tablet (200 mg total) by mouth daily. 30 tablet 1  . levothyroxine (SYNTHROID, LEVOTHROID) 88 MCG tablet Take 1 tablet (88 mcg total) by mouth daily before breakfast. For low thyroid function    . LORazepam (ATIVAN) 1 MG tablet Take 1 tablet (1 mg total) by mouth 2 (two) times daily as needed for anxiety. 60 tablet 1  . methylphenidate (RITALIN) 10 MG tablet Take 0.5 tablets (5 mg total) by mouth once. (Patient not taking: Reported on 01/10/2015) 15 tablet 0  . nebivolol (BYSTOLIC) 2.5 MG tablet Take 1 tablet (2.5 mg total) by mouth daily. 90 tablet 3  . rOPINIRole (REQUIP) 0.5 MG tablet TAKE 1 TABLET BY MOUTH LATE IN THE AFTERNOON AND 2 TABS AFTER DINNER 90 tablet 5  . valsartan-hydrochlorothiazide (DIOVAN-HCT) 320-25 MG per tablet Take 1 tablet by mouth daily. High blood pressure control 30 tablet 0   No current facility-administered medications for this visit.    Allergies:  Allergies  Allergen Reactions  . Abilify [Aripiprazole] Other (See Comments)    jerking  . Compazine [Prochlorperazine Edisylate] Nausea And Vomiting  . Diflucan [Fluconazole] Nausea Only    HA  . Geodon [Ziprasidone Hydrochloride] Other (See Comments)    Extremely aggitated  .  Lithium Nausea Only and Other (See Comments)    Off balance, increased heart rate  . Talwin [Pentazocine] Other (See Comments)    Chest pain  . Toradol [Ketorolac Tromethamine] Other (See Comments)    hallucinations      Physical Exam: Blood pressure 137/78, pulse 65, temperature 98.6 F (37 C), temperature source Oral, resp. rate 18, height 5\' 5"  (1.651 m), weight 214 lb 4.8 oz (97.206 kg), SpO2 99 %. ECOG: 1 General appearance: alert healthy-appearing woman without distress.  Head: Normocephalic,  without obvious abnormality Neck: no adenopathy, no masses. Lymph nodes: Cervical, supraclavicular, and axillary nodes normal. Heart:regular rate and rhythm, S1, S2 normal, no murmur, click, rub or gallop Lung:chest clear, no wheezing, rales, normal symmetric air entry Abdomen: soft, non-tender, without masses or organomegaly no shifting dullness or ascites.  EXT:no erythema, induration, or nodules   Lab Results: Lab Results  Component Value Date   WBC 5.2 02/18/2015   HGB 13.6 02/18/2015   HCT 42.2 02/18/2015   MCV 84.1 02/18/2015   PLT 263 02/18/2015     Chemistry      Component Value Date/Time   NA 141 02/07/2014 1401   NA 138 04/26/2013 1715   NA 143 06/02/2011 1008   K 3.9 02/07/2014 1401   K 4.1 04/26/2013 1715   K 3.9 06/02/2011 1008   CL 97 04/26/2013 1715   CL 103 07/21/2012 1415   CL 95* 06/02/2011 1008   CO2 26 02/07/2014 1401   CO2 27 04/26/2013 1715   CO2 29 06/02/2011 1008   BUN 16.4 02/07/2014 1401   BUN 16 04/26/2013 1715   BUN 14 06/02/2011 1008   CREATININE 0.8 02/07/2014 1401   CREATININE 0.79 04/26/2013 1715   CREATININE 0.8 06/02/2011 1008      Component Value Date/Time   CALCIUM 9.9 02/07/2014 1401   CALCIUM 10.8* 04/26/2013 1715   CALCIUM 9.4 06/02/2011 1008   ALKPHOS 92 02/07/2014 1401   ALKPHOS 105 06/29/2012 1030   ALKPHOS 90* 06/02/2011 1008   AST 19 02/07/2014 1401   AST 25 06/29/2012 1030   AST 25 06/02/2011 1008   ALT 17 02/07/2014 1401   ALT 21 06/29/2012 1030   ALT 27 06/02/2011 1008   BILITOT 0.38 02/07/2014 1401   BILITOT 0.3 06/29/2012 1030   BILITOT 0.70 06/02/2011 1008        Impression and Plan:   69 year old female with the following issues:  1. Renal mass on the right side very suspicious for malignancy.  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 She provided records dating back to 2003 with a neoplasm in the kidney being present. At this point, this  appears to be less risk of metastasis but it's very possible. She continues to decline surgical options despite improvement in her mood. The plan is to continue with active surveillance and repeat imaging studies as needed. If she develops signs of symptoms of metastasis including worsening back pain or hematuria we will consider imaging studies at that time.  2. Depression.  mood is very stable at this time. 3. Followup will be in 12 months' time.     [facility] 11/29/20163:07 PM

## 2015-02-18 NOTE — Telephone Encounter (Signed)
Gave and printed appt sched and avs for pt for NOV 2017 °

## 2015-02-20 ENCOUNTER — Other Ambulatory Visit (HOSPITAL_COMMUNITY): Payer: Self-pay | Admitting: Psychiatry

## 2015-02-26 ENCOUNTER — Ambulatory Visit (INDEPENDENT_AMBULATORY_CARE_PROVIDER_SITE_OTHER): Payer: 59 | Admitting: Psychiatry

## 2015-02-26 DIAGNOSIS — F3130 Bipolar disorder, current episode depressed, mild or moderate severity, unspecified: Secondary | ICD-10-CM

## 2015-02-27 ENCOUNTER — Ambulatory Visit (INDEPENDENT_AMBULATORY_CARE_PROVIDER_SITE_OTHER): Payer: 59 | Admitting: Psychiatry

## 2015-02-27 ENCOUNTER — Encounter (HOSPITAL_COMMUNITY): Payer: Self-pay | Admitting: Psychiatry

## 2015-02-27 VITALS — BP 128/84 | HR 64 | Ht 65.0 in | Wt 213.4 lb

## 2015-02-27 DIAGNOSIS — F3130 Bipolar disorder, current episode depressed, mild or moderate severity, unspecified: Secondary | ICD-10-CM | POA: Diagnosis not present

## 2015-02-27 DIAGNOSIS — F4310 Post-traumatic stress disorder, unspecified: Secondary | ICD-10-CM | POA: Diagnosis not present

## 2015-02-27 MED ORDER — LORAZEPAM 1 MG PO TABS: 1.0000 mg | ORAL_TABLET | Freq: Two times a day (BID) | ORAL | Status: DC | PRN

## 2015-02-27 MED ORDER — BUPROPION HCL ER (XL) 300 MG PO TB24: ORAL_TABLET | ORAL | Status: DC

## 2015-02-27 MED ORDER — METHYLPHENIDATE HCL 10 MG PO TABS: 5.0000 mg | ORAL_TABLET | Freq: Once | ORAL | Status: DC

## 2015-02-27 MED ORDER — FLUOXETINE HCL 40 MG PO CAPS: 40.0000 mg | ORAL_CAPSULE | Freq: Every day | ORAL | Status: DC

## 2015-02-27 MED ORDER — LAMOTRIGINE 200 MG PO TABS: 200.0000 mg | ORAL_TABLET | Freq: Every day | ORAL | Status: DC

## 2015-02-27 NOTE — Progress Notes (Signed)
[facility] [phone] Progress Note  [name] [id number] 69 y.o.  02/27/2015 10:21 AM  Chief Complaint: "not doing well"  History of Present Illness: Pt stopped Ritalin 2 weeks ago due to increased BP. Pt states BP has been elevated on days that she doesn't take Ritalin as well. Pt has been taking her BP meds and plans to f/up with her PCP soon. She notes that her motivation decreased significantly after she stopped it. States mood, low energy and concentration were all better with Ritalin use.   Reports depressed mood, low energy, low motivation and she is spending a lot of time in bed. Reports anhedonia and some lonely feelings.  Wellbutrin and Prozac are helping some. States this is the time of her year her SAD starts usually. Pt has not bought a light box yet.    Sleeping 12-14 hrs/day. Appetite is poor. Energy is low and she naps a lot.  Reports for 3 days two weeks ago she was very irritable. Energy was low but she was not sleeping well. She wasn't sleeping well because her fiance was spending the night.   PTSD- stable.  HV and startle reaction are present. She does experience a lot of anger towards her father. Pt is having nightmares and intrusive memories.   Anxiety is tolerable and Bystolic and Ativan are helping. She usually takes Ativan once a day and it calms her. When she tired she has more anxiety.   Wellbutrin, Lamictal, and Prozac are effective and denies SE.   Reports libido is down and it is not affecting her relationship yet.   Suicidal Ideation: No Plan Formed: No Patient has means to carry out plan: No  Homicidal Ideation: No Plan Formed: No Patient has means to carry out plan: No  Review of Systems: Psychiatric: Agitation: No Hallucination: No Depressed Mood: Yes Insomnia: No Hypersomnia: Yes Altered Concentration: Yes but she takes Ritalin and it helps Feels Worthless: No Grandiose Ideas: No Belief In Special Powers: No New/Increased  Substance Abuse: No Compulsions: No  Review of Systems  Constitutional: Positive for malaise/fatigue. Negative for fever and chills.  HENT: Negative for congestion, ear pain and sore throat.   Eyes: Negative for blurred vision, double vision and redness.  Respiratory: Negative for cough, sputum production and shortness of breath.   Cardiovascular: Negative for chest pain, palpitations and leg swelling.  Gastrointestinal: Negative for heartburn, nausea, vomiting and abdominal pain.  Musculoskeletal: Negative for back pain, joint pain and neck pain.  Skin: Negative for itching and rash.  Neurological: Negative for dizziness, tingling, seizures, loss of consciousness and headaches.  Psychiatric/Behavioral: Positive for depression. Negative for suicidal ideas, hallucinations and substance abuse. The patient is nervous/anxious and has insomnia.    Neurologic: Headache: Yes Seizure: No Paresthesias: No  Past Medical Family, Social History: Pt is divorced and living in [place]. Pt lives alone. Unemployed. She has one daughter.  reports that she has never smoked. She has never used smokeless tobacco. She reports that she does not drink alcohol or use illicit drugs.  Family History  Problem Relation Age of Onset  . Allergies Father   . Skin cancer Father   . Bipolar disorder Father   . Alcohol abuse Father   . Allergies Sister     multiple  . Allergies Brother     multiple  . Allergies Daughter   . Heart disease Mother   . Depression Mother   . Hypertension Mother   . CAD Mother   .  Heart disease Maternal Grandfather   . Colon cancer Paternal Grandmother   . OCD Paternal Grandmother   . Cancer Paternal Grandfather    Past Medical History  Diagnosis Date  . Abscess of Bartholin's gland   . History of pleural effusion   . History of attention deficit disorder   . History of endometriosis   . RLS (restless legs syndrome)   . Anxiety   . Obesity   . GERD (gastroesophageal  reflux disease)   . Renal mass 02/15/2012  . Depression     Bipolar disorder/goes to [facility] for meds  . OSA (obstructive sleep apnea)     uses oral appliance instead of CPAP  . Vertigo   . Chronic kidney disease     kidney cancer- pt states she has elected to not have it treated.  Marland Kitchen PONV (postoperative nausea and vomiting)   . Difficult intubation     told by [facility] that she was hard to intubate 15b yrs ago in [place]- surgery since then no problems  . Adverse effect of general anesthetic     felt paralyzed while receiving anesthesia  . Bipolar 1 disorder ([place])   . PTSD (post-traumatic stress disorder)   . ADHD (attention deficit hyperactivity disorder)   . Migraines   . rt renal ca dx'd 12/2009    no treatment/ no surg  . Acute vestibular neuronitis     Dr Lucia Gaskins  . RLS (restless legs syndrome)     Dr Gwenette Greet  . H/O echocardiogram 07/2012    Normla LVF w grade I siastolic dysfunction   . PVC's (premature ventricular contractions)   . Hypertension   . 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 Dysrhythmia     h/o atrial tachycardia- s/p ablation      Outpatient Encounter Prescriptions as of 02/27/2015  Medication Sig  . aspirin 81 MG EC tablet Take 1 tablet (81 mg total) by mouth daily. For heart health  . buPROPion (WELLBUTRIN XL) 300 MG 24 hr tablet TAKE 1 TABLET (300 MG TOTAL) BY MOUTH DAILY. FOR DEPRESSION  . esomeprazole (NEXIUM) 40 MG capsule Take 40 mg by mouth as needed. GERD  . FLUoxetine (PROZAC) 40 MG capsule Take 1 capsule (40 mg total) by mouth daily.  . fluticasone (FLONASE) 50 MCG/ACT nasal spray Place 1 spray into both nostrils as needed. Sinus problems  . lamoTRIgine (LAMICTAL) 200 MG tablet TAKE 1 TABLET BY MOUTH EVERY DAY  . levothyroxine (SYNTHROID, LEVOTHROID) 88 MCG tablet Take 1 tablet (88 mcg total) by mouth daily before breakfast. For low thyroid function  . LORazepam (ATIVAN) 1 MG tablet Take 1 tablet (1 mg total) by  mouth 2 (two) times daily as needed for anxiety.  . nebivolol (BYSTOLIC) 2.5 MG tablet Take 1 tablet (2.5 mg total) by mouth daily.  Marland Kitchen rOPINIRole (REQUIP) 0.5 MG tablet TAKE 1 TABLET BY MOUTH LATE IN THE AFTERNOON AND 2 TABS AFTER DINNER  . valsartan-hydrochlorothiazide (DIOVAN-HCT) 320-25 MG per tablet Take 1 tablet by mouth daily. High blood pressure control  . butalbital-acetaminophen-caffeine (FIORICET, ESGIC) 50-325-40 MG per tablet Take 1 tablet by mouth every 6 (six) hours as needed for headache.   . methylphenidate (RITALIN) 10 MG tablet Take 0.5 tablets (5 mg total) by mouth once. (Patient not taking: Reported on 01/10/2015)   No facility-administered encounter medications on file as of 02/27/2015.    Past Psychiatric History/Hospitalization(s): Anxiety: Yes Bipolar Disorder: Yes Depression: Yes Mania: Yes Psychosis:  No Schizophrenia: No Personality Disorder: No Hospitalization for psychiatric illness: Yes History of Electroconvulsive Shock Therapy: No Prior Suicide Attempts: No  Physical Exam: Constitutional:  BP 128/84 mmHg  Pulse 64  Ht 5\' 5"  (1.651 m)  Wt 213 lb 6.4 oz (96.798 kg)  BMI 35.51 kg/m2  General Appearance: alert, oriented, no acute distress  Musculoskeletal: Strength & Muscle Tone: within normal limits Gait & Station: normal Patient leans: N/A  Mental Status Examination/Evaluation: Objective: Attitude: Calm and cooperative  Appearance: Fairly Groomed, appears to be stated age  Engineer, water::  Fair  Speech:  Clear and Coherent and Normal Rate  Volume:  Normal  Mood:  depressed  Affect:  Depressed  Thought Process:  Intact, Linear and Logical  Orientation:  Full (Time, Place, and Person)  Thought Content:  Negative  Suicidal Thoughts:  No  Homicidal Thoughts:  No  Judgement:  Fair  Insight:  Fair  Concentration: good  Memory: Immediate-intact Recent-intact Remote-intact  Recall: fair  Language: fair  Gait and Station: normal  [employer] of [employer]: average  Psychomotor Activity:  Normal  Akathisia:  No  Handed:  Right  AIMS (if indicated):  n/a      Medical Decision Making (Choose Three): Review of Psycho-Social Stressors (1), Established Problem, Worsening (2), Review of Last Therapy Session (1), Review of Medication Regimen & Side Effects (2) and Review of New Medication or Change in Dosage (2)  Assessment: Axis I: Bipolar d/o- current episode depressed;  PTSD Axis II: deferred   Plan:  Ativan 1mg  BID prn anxiety Lamictal 200mg  po qD (Pt was only taking 150mg  previously) for mood stabalization Continue Wellbutrin XL 300mg  po qD for depression Continue Prozac 40mg  po qD for depression and anxiety and PTSD Restart Ritalin 5mg  po qD for mood augmenation  Medication management with supportive therapy. Risks/benefits and SE of the medication discussed. Pt verbalized understanding and verbal consent obtained for treatment.  Affirm with the patient that the medications are taken as ordered. Patient expressed understanding of how their medications were to be used.  -worsening of symptoms  Recommended pt obtain light box or sitting in direct sunlight for 1-2 hrs/day  Reviewed labs from 02/07/2014: CBC WNL, CMP WNL,     Therapy: brief supportive therapy provided. Discussed psychosocial stressors in detail.  Encouraged to continue individual therapy with Anderson Malta.   Pt denies SI and is at an acute low risk for suicide.Patient told to call clinic if any problems occur. Patient advised to go to ER if they should develop SI/HI, side effects, or if symptoms worsen. Has crisis numbers to call if needed. Pt verbalized understanding.  F/up in 2 months or sooner if needed  [name], MD 02/27/2015

## 2015-02-27 NOTE — Progress Notes (Signed)
   THERAPIST PROGRESS NOTE   Session Time: 1:30-2:25  Participation Level: Active   Behavioral Response: CasualAlertMildlyDysphoric  Type of Therapy: Individual Therapy   Treatment Goals addressed: Anxiety   Interventions: CBT, supportive  Summary: [name] is a 69 y.o. female who presents with anxiety and depression.   Suicidal/Homicidal: Nowithout intent/plan   Therapist Response:Pt. Presents as talkative, makes good eye contact, appropriately tearful. Pt. Reports that she has been feeling more depressed in last few weeks and attributes to seasonal affective disorder, feeling overwhelmed by fears related to relationship and caregiving of significant other and estrangement from her sisters. Pt. Reports that she has had poor energy, but manages to get out of bed, get dressed, let her cat out, and prepare small meals. Pt. Reports that she would like to get started with craft projects but is set back by not having space on her dining room table. Pt. Reports ongoing problems with disorganization that she attributes to ADHD which is is currently not taking her medication. Pt. Discussed family history of poor relationships with her sisters and fears of further extending herself in these relationships because of fears of further rejection. Pt. Also discussed concerns related to significant other's mental illness which she is afraid is getting worse and thinks that she cannot intervene because of his paranoia. Session focused on developing acceptance of her family member's and creating new holiday traditions without them. Revisited homework from last session and Pt. Was encouraged to continue making master list of household tasks and choosing one item from the list to start each day.  Diagnosis: Axis I: Depressive Disorder NOS    Nancie Neas, [facility] 02/27/2015

## 2015-03-03 ENCOUNTER — Ambulatory Visit (INDEPENDENT_AMBULATORY_CARE_PROVIDER_SITE_OTHER): Payer: 59 | Admitting: Psychiatry

## 2015-03-03 DIAGNOSIS — F3130 Bipolar disorder, current episode depressed, mild or moderate severity, unspecified: Secondary | ICD-10-CM

## 2015-03-04 NOTE — Progress Notes (Signed)
   THERAPIST PROGRESS NOTE  Session Time: 2:05-2:55  Participation Level: Active   Behavioral Response: CasualAlertMildlyDysphoric  Type of Therapy: Individual Therapy   Treatment Goals addressed: Anxiety   Interventions: CBT, supportive  Summary: [name] is a 69 y.o. female who presents with anxiety and depression.   Suicidal/Homicidal: Nowithout intent/plan   Therapist Response:Pt. Continues to present as talkative, brightened mood compared to last week's session. Pt. Celebrated her 69th birthday last Friday. Pt. Reported that she enjoyed the day and was able to "stay in the moment". Pt. Was saddened during last meeting because she felt disconnected from her sister's. Pt. Reported feeling encouraged because they contacted her for her birthday. Pt. Reports that she continues to be happy in relationship with her significant other, but continues to have concerns about his mental health. Pt. Discussed concerns about her medication and believes that she is no longer receiving benefit from the medication. Pt. Reports that she has been taking her an anti-depressant for about 8 years. Pt. Reports that she has no suicidal thoughts, she has a good appetite, she sleeps well, no concerns about libido, she currently feels like engaging in self-care, and is able to organize herself and find joy in shopping and holiday and birthday celebrations. Pt. Discussed plans to spend the Christmas holiday in [place] or [place] with her significant other. Pt. Was encouraged to speak to her psychiatrist about medication concerns but acknowledged that she has not experienced a depression crisis in about the last 1 1/2 years. Of most significant concern is ability to complete unpacking and cleaning her home. This was discussed as consistent with her ADHD which she is no longer treating with medication. Pt. Reported that she started homework assignment of creating a master list of all of her tasks and committing  to one task a day. Pt. Stated that she would like to bring the list to our next session so that she can work on prioritizing the list and making plans to begin her highest priority tasks.   Diagnosis: Axis I: Depressive Disorder NOS   Plan: Pt. To continue with CBT based therapy and return in approximately 3 weeks after the holidays.   Nancie Neas, [facility] 03/04/2015

## 2015-03-07 ENCOUNTER — Ambulatory Visit: Payer: Self-pay | Admitting: Adult Health

## 2015-03-10 ENCOUNTER — Ambulatory Visit (INDEPENDENT_AMBULATORY_CARE_PROVIDER_SITE_OTHER): Payer: 59 | Admitting: Psychiatry

## 2015-03-10 DIAGNOSIS — F3130 Bipolar disorder, current episode depressed, mild or moderate severity, unspecified: Secondary | ICD-10-CM

## 2015-03-12 NOTE — Progress Notes (Signed)
   THERAPIST PROGRESS NOTE   Session Time: 3:05-3:55  Participation Level: Active   Behavioral Response: CasualAlertEuthymic  Type of Therapy: Individual Therapy   Treatment Goals addressed: Anxiety   Interventions: CBT, supportive  Summary: [name] is a 69 y.o. female who presents with anxiety and depression.   Suicidal/Homicidal: Nowithout intent/plan   Therapist Response:Pt. Continues to present as talkative, appropriately tearful. Pt. Reports that she continues to have difficulty focusing on household tasks specifically unpacking boxes. Counselor discussed clarifying of values and life purpose. Pt. Discussed interests in creativity i.e., beading, painting, decorating during the holidays, interest in color and fashion. Pt. Reports that the boxes that she has wanted to unpack for months make her feel depressed. Pt. Was encouraged to temporarily remove them from her "to-do" list. Pt. Discussed that she has not needed items in the boxes since she moved into her apartment in the spring and Pt. Identified that the items in the boxes are not needed for her to move toward her life purpose and dreams that she has had for herself. Pt. Also identified that the items int he box are not representative of her values. Pt. Was encouraged to refocus her "to-do list" on activities that affirm her purpose such as reconnecting with friends during the holidays, going to her community room for one hour a week with her art supplies and asking residents to join her. Pt. Also discussed sadness regarding the mental health of her boyfriend. Pt. Discussed concerns about his mental health and worry that she will not be able to take care of him. Pt. Discussed holiday plans and concerns that she will not feel like driving. Pt. Was encouraged to look at transportation alternative such as bus day trips and taking the train. Pt. Discussed plans to purchase a light to assist with her seasonal affective  disorder.  Diagnosis: Axis I: Depressive Disorder NOS   Plan: Pt. To continue with CBT based therapy and return in approximately 3 weeks after the holidays.  Nancie Neas, [facility] 03/12/2015

## 2015-03-25 ENCOUNTER — Encounter (HOSPITAL_BASED_OUTPATIENT_CLINIC_OR_DEPARTMENT_OTHER): Payer: Self-pay

## 2015-03-26 ENCOUNTER — Telehealth: Payer: Self-pay | Admitting: Adult Health

## 2015-03-26 DIAGNOSIS — G4733 Obstructive sleep apnea (adult) (pediatric): Secondary | ICD-10-CM

## 2015-03-26 NOTE — Telephone Encounter (Signed)
Spoke with pt, states that she had previously cancelled her sleep study and is ready to reschedule it.  Pt called sleep lab to reschedule this, was told that a new order is needed as too much time has passed from the sleep study ordered at pt's last ov on 10/21 with TP.    TP please advise if you're ok with reordering this sleep study.  Thanks!

## 2015-03-28 NOTE — Telephone Encounter (Signed)
That is fine  Thanks , make sure she has a follow up few weeks after test is done.

## 2015-03-28 NOTE — Telephone Encounter (Signed)
Order for Sleep Study entered. Patient notified that Order has been entered and she will receive call to schedule. Patient also advised to call to schedule follow up appointment once her Sleep Study has been scheduled. Nothing further needed.

## 2015-04-02 ENCOUNTER — Ambulatory Visit (INDEPENDENT_AMBULATORY_CARE_PROVIDER_SITE_OTHER): Payer: 59 | Admitting: Psychiatry

## 2015-04-02 DIAGNOSIS — F3130 Bipolar disorder, current episode depressed, mild or moderate severity, unspecified: Secondary | ICD-10-CM | POA: Diagnosis not present

## 2015-04-03 NOTE — Progress Notes (Signed)
   THERAPIST PROGRESS NOTE  Session Time: 2:50-3:45  Participation Level: Active   Behavioral Response: CasualAlertDepressed/Tearful  Type of Therapy: Individual Therapy   Treatment Goals addressed: Anxiety   Interventions: CBT, supportive  Summary: [name] is a 70 y.o. female who presents with anxiety and depression.   Suicidal/Homicidal: Nowithout intent/plan   Therapist Response:Pt. Presented as talkative, tearful, reports that she felt moderately depressed. Pt. Attributes depression to seasonal affective disorder. Pt. Reported that it has been difficult for her to get out of bed and exercise self-care, that food is repulsive to her, and that she frequently sleeps on top of her bed. Pt. Reports that she continues to be able to get out of bed and meets appointments for mental and physical health providers and she is eating enough to sustain herself. Significant time in session was spent normalizing her behavior for this time of the year, encouraging Pt. To focus on minimal behaviors and resisting perfectionism. Pt. Discussed feeling overwhelmed by relationship with significant other and recognizing that his mental illness may mean that the relationship will not be long term. Significant time in session was spent verbalizing boundaries in relationship i.e., Pt.'s commitment to not tolerating her significant other's abuse of medications and disruptive personality changes and commitment to healthy space boundaries. Pt. Reported that she was able to enjoy the Christmas holiday and that she was able to meaningfully reconnect with one of her sisters. Pt. Discussed plans to travel in the spring.   Diagnosis: Axis I: Depressive Disorder NOS   Plan: Pt. To continue with CBT based therapy and return in approximately 3 weeks after the holidays.  Nancie Neas, [facility] 04/03/2015

## 2015-04-07 ENCOUNTER — Ambulatory Visit (INDEPENDENT_AMBULATORY_CARE_PROVIDER_SITE_OTHER): Payer: 59 | Admitting: Psychiatry

## 2015-04-07 DIAGNOSIS — F3130 Bipolar disorder, current episode depressed, mild or moderate severity, unspecified: Secondary | ICD-10-CM

## 2015-04-09 NOTE — Progress Notes (Signed)
   THERAPIST PROGRESS NOTE  Session Time: 3:05-4:00  Participation Level: Active   Behavioral Response: CasualAlertDepressed  Type of Therapy: Individual Therapy   Treatment Goals addressed: Anxiety   Interventions: CBT, supportive  Summary: [name] is a 70 y.o. female who presents with anxiety and depression.   Suicidal/Homicidal: Nowithout intent/plan   Therapist Response:Pt. Presented as talkative, continues to report that she is moderately depressed with cycling between good days and bad days. Pt. Reports that her mood is significantly impacted by the weather, and has borrowed a light therapy lamp from her boyfriend. Pt. Continues to report that is often difficult for her to get out of bed. Pt. Discussed finding that her granddaughter was in a town close to her visiting from out of state and did not get in touch with her. Pt. Discussed pattern of challenging relationships and making the decision to let the relationship go. Pt. Was encouraged to make effort in the relationships in small ways that felt less threatening such as sending a post card with kind thoughts about the person instead of a phone call or gift. Pt. Discussed difficulty with self-care especially bathing which currently feels like a chore. Session focused on mindfulness and engaging in activities with mindfulness in order to encourage motivation i.e., focusing on the scent, warmth, image of the water flowing from her bath faucet.  Diagnosis: Axis I: Depressive Disorder NOS   Plan: Pt. To continue with CBT based therapy and return in approximately 3 weeks.  Nancie Neas, [facility] 04/09/2015

## 2015-04-21 ENCOUNTER — Ambulatory Visit (HOSPITAL_COMMUNITY): Payer: Self-pay | Admitting: Psychiatry

## 2015-05-01 ENCOUNTER — Ambulatory Visit (HOSPITAL_COMMUNITY): Payer: Self-pay | Admitting: Psychiatry

## 2015-05-05 ENCOUNTER — Encounter: Payer: Self-pay | Admitting: *Deleted

## 2015-05-05 ENCOUNTER — Ambulatory Visit (INDEPENDENT_AMBULATORY_CARE_PROVIDER_SITE_OTHER): Payer: 59 | Admitting: Psychiatry

## 2015-05-05 DIAGNOSIS — F3130 Bipolar disorder, current episode depressed, mild or moderate severity, unspecified: Secondary | ICD-10-CM

## 2015-05-06 NOTE — Progress Notes (Signed)
   THERAPIST PROGRESS NOTE  Session Time: 1:05-2:00  Participation Level: Active   Behavioral Response: CasualAlertEuthymic  Type of Therapy: Individual Therapy   Treatment Goals addressed: Anxiety   Interventions: CBT, supportive  Summary: [name] is a 70 y.o. female who presents with anxiety and depression.   Suicidal/Homicidal: Nowithout intent/plan   Therapist Response:Pt. Presented as talkative, engaged in therapeutic process. Pt. Reports that she is considerably less depressed and in better mood since our last session. Pt. Reports that her significant other who is major source of stress for her has been doing much better and they are spending more time together. Pt. Reports that she has made considerable progress in organizing her home by clearing off a sofa in her living area and unpacking several boxes and moving them to storage. Pt. Reported that she has also seen improvement in her ability to regulate her sleep is sleeping in a healthy range between 7-9 hours. Significant time in session was spent recognizing seasonal cues for depression, reminding Pt. Of patterns of seasonal affective disorder. Pt. Was encouraged to plan outings and vacations in warmer climates for winter. Pt. Stated that she has done this in the past and it was very helpful for her in managing her depression. Pt. Recognized pattern that her depression tends to worsen in late fall and begins to get better in late February- March. Pt. Reported that she is using her lamp for therapy daily for 30 minutes and that it has been helpful. Pt. Expressed concern about her diet, consumption of too many simple carbohydrates. Referral was made to [facility].  Plan: Pt. To continue with CBT based therapy and return in approximately 3 weeks.  Nancie Neas, [facility] 05/06/2015

## 2015-05-19 ENCOUNTER — Ambulatory Visit (HOSPITAL_COMMUNITY): Payer: Self-pay | Admitting: Psychiatry

## 2015-05-19 ENCOUNTER — Ambulatory Visit (HOSPITAL_BASED_OUTPATIENT_CLINIC_OR_DEPARTMENT_OTHER): Payer: Medicare Other

## 2015-05-27 ENCOUNTER — Other Ambulatory Visit (HOSPITAL_COMMUNITY): Payer: Self-pay | Admitting: Psychiatry

## 2015-05-28 ENCOUNTER — Other Ambulatory Visit (HOSPITAL_COMMUNITY): Payer: Self-pay | Admitting: Psychiatry

## 2015-06-03 ENCOUNTER — Encounter (HOSPITAL_COMMUNITY): Payer: Self-pay | Admitting: Psychiatry

## 2015-06-03 ENCOUNTER — Ambulatory Visit (INDEPENDENT_AMBULATORY_CARE_PROVIDER_SITE_OTHER): Payer: 59 | Admitting: Psychiatry

## 2015-06-03 VITALS — BP 137/83 | HR 77 | Ht 65.0 in | Wt 215.8 lb

## 2015-06-03 DIAGNOSIS — F411 Generalized anxiety disorder: Secondary | ICD-10-CM | POA: Diagnosis not present

## 2015-06-03 DIAGNOSIS — F4310 Post-traumatic stress disorder, unspecified: Secondary | ICD-10-CM | POA: Diagnosis not present

## 2015-06-03 DIAGNOSIS — F3130 Bipolar disorder, current episode depressed, mild or moderate severity, unspecified: Secondary | ICD-10-CM | POA: Diagnosis not present

## 2015-06-03 MED ORDER — METHYLPHENIDATE HCL 5 MG PO TABS: 5.0000 mg | ORAL_TABLET | Freq: Every day | ORAL | Status: DC

## 2015-06-03 MED ORDER — LAMOTRIGINE 200 MG PO TABS: 200.0000 mg | ORAL_TABLET | Freq: Every day | ORAL | Status: DC

## 2015-06-03 MED ORDER — LORAZEPAM 1 MG PO TABS: 1.0000 mg | ORAL_TABLET | Freq: Two times a day (BID) | ORAL | Status: DC | PRN

## 2015-06-03 MED ORDER — FLUOXETINE HCL 40 MG PO CAPS: 40.0000 mg | ORAL_CAPSULE | Freq: Every day | ORAL | Status: DC

## 2015-06-03 MED ORDER — BUPROPION HCL ER (XL) 300 MG PO TB24: ORAL_TABLET | ORAL | Status: DC

## 2015-06-03 NOTE — Progress Notes (Signed)
Patient ID: [name], female   DOB: 07-Jan-1946, 70 y.o.   MRN: [id number]  [place] [id number] Progress Note  [name] [id number] 70 y.o.  06/03/2015 8:48 AM  Chief Complaint: "not doing well for last one week"  History of Present Illness: Pt takes Ritalin about 3 times a week. Her mind is clearer and pt is more goal oriented.  States mood, low energy and concentration were all better with Ritalin use. When it wears off (wears off after 4 hrs) she feels anxious and treats with Ativan.   Reports depressed mood, low energy, low motivation and she is spending a lot of time in bed. Reports anhedonia and some lonely feelings.  Pt is not engaging hobbies and is not social. Wellbutrin and Prozac are helping some. States this is the time of her year she has SAD. Pt is using her light box about 30 min a day.    Reports repulsiveness to her sheets, showering, kitchen and food. It has been going on for 4 months. Pt is not eating meals and is eating snacks thru out the day. Food makes her want to gag.    Sleeping 12-14 hrs/day. Energy is low and she naps a lot.  Irritability is decreased.    PTSD- stable.  HV and startle reaction are present. She does experience a lot of anger towards her father. Pt is having nightmares and intrusive memories.   Anxiety is tolerable and Bystolic and Ativan are helping. She usually takes Ativan once a day and it calms her. When she tired she has more anxiety.   Wellbutrin, Lamictal, Ativan, Ritalin and Prozac are effective and denies SE.   Reports libido is down and it is not affecting her relationship yet.   Suicidal Ideation: No Plan Formed: No Patient has means to carry out plan: No  Homicidal Ideation: No Plan Formed: No Patient has means to carry out plan: No  Review of Systems: Psychiatric: Agitation: No Hallucination: No Depressed Mood: Yes Insomnia: No Hypersomnia: Yes Altered Concentration: Yes but she takes Ritalin and  it helps Feels Worthless: No Grandiose Ideas: No Belief In Special Powers: No New/Increased Substance Abuse: No Compulsions: No  Review of Systems  Constitutional: Positive for malaise/fatigue. Negative for fever and chills.  HENT: Negative for congestion, ear pain and sore throat.   Eyes: Negative for blurred vision, double vision and redness.  Respiratory: Negative for cough, sputum production and shortness of breath.   Cardiovascular: Negative for chest pain, palpitations and leg swelling.  Gastrointestinal: Negative for heartburn, nausea, vomiting and abdominal pain.  Musculoskeletal: Positive for joint pain and falls. Negative for back pain and neck pain.  Skin: Negative for itching and rash.  Neurological: Negative for dizziness, tingling, seizures, loss of consciousness and headaches.  Psychiatric/Behavioral: Positive for depression. Negative for suicidal ideas, hallucinations and substance abuse. The patient is nervous/anxious and has insomnia.     Past Medical Family, Social History: Pt is divorced and living in [place]. Pt lives alone. Unemployed. She has one daughter.  reports that she has never smoked. She has never used smokeless tobacco. She reports that she does not drink alcohol or use illicit drugs.  Family History  Problem Relation Age of Onset  . Allergies Father   . Skin cancer Father   . Bipolar disorder Father   . Alcohol abuse Father   . Allergies Sister     multiple  . Allergies Brother     multiple  . Allergies Daughter   .  Heart disease Mother   . Depression Mother   . Hypertension Mother   . CAD Mother   . Heart disease Maternal Grandfather   . Colon cancer Paternal Grandmother   . OCD Paternal Grandmother   . Cancer Paternal Grandfather    Past Medical History  Diagnosis Date  . Abscess of Bartholin's gland   . History of pleural effusion   . History of attention deficit disorder   . History of endometriosis   . RLS (restless legs  syndrome)   . Anxiety   . Obesity   . GERD (gastroesophageal reflux disease)   . Renal mass 02/15/2012  . Depression     Bipolar disorder/goes to [facility] for meds  . OSA (obstructive sleep apnea)     uses oral appliance instead of CPAP  . Vertigo   . Chronic kidney disease     kidney cancer- pt states she has elected to not have it treated.  Marland Kitchen PONV (postoperative nausea and vomiting)   . Difficult intubation     told by [facility] that she was hard to intubate 15b yrs ago in [place]- surgery since then no problems  . Adverse effect of general anesthetic     felt paralyzed while receiving anesthesia  . Bipolar 1 disorder ([place])   . PTSD (post-traumatic stress disorder)   . ADHD (attention deficit hyperactivity disorder)   . Migraines   . rt renal ca dx'd 12/2009    no treatment/ no surg  . Acute vestibular neuronitis     Dr Lucia Gaskins  . RLS (restless legs syndrome)     Dr Gwenette Greet  . H/O echocardiogram 07/2012    [place]la LVF w grade I siastolic dysfunction   . PVC's (premature ventricular contractions)   . Hypertension   . 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 Dysrhythmia     h/o atrial tachycardia- s/p ablation      Outpatient Encounter Prescriptions as of 06/03/2015  Medication Sig  . aspirin 81 MG EC tablet Take 1 tablet (81 mg total) by mouth daily. For heart health  . buPROPion (WELLBUTRIN XL) 300 MG 24 hr tablet TAKE 1 TABLET (300 MG TOTAL) BY MOUTH DAILY. FOR DEPRESSION  . butalbital-acetaminophen-caffeine (FIORICET, ESGIC) 50-325-40 MG per tablet Take 1 tablet by mouth every 6 (six) hours as needed for headache.   . esomeprazole (NEXIUM) 40 MG capsule Take 40 mg by mouth as needed. GERD  . FLUoxetine (PROZAC) 40 MG capsule Take 1 capsule (40 mg total) by mouth daily.  . fluticasone (FLONASE) 50 MCG/ACT nasal spray Place 1 spray into both nostrils as needed. Sinus problems  . lamoTRIgine (LAMICTAL) 200 MG tablet Take 1 tablet (200 mg total)  by mouth daily.  Marland Kitchen levothyroxine (SYNTHROID, LEVOTHROID) 88 MCG tablet Take 1 tablet (88 mcg total) by mouth daily before breakfast. For low thyroid function (Patient taking differently: Take 100 mcg by mouth daily before breakfast. For low thyroid function)  . LORazepam (ATIVAN) 1 MG tablet Take 1 tablet (1 mg total) by mouth 2 (two) times daily as needed for anxiety.  . methylphenidate (RITALIN) 10 MG tablet Take 0.5 tablets (5 mg total) by mouth once.  . nebivolol (BYSTOLIC) 2.5 MG tablet Take 1 tablet (2.5 mg total) by mouth daily.  Marland Kitchen rOPINIRole (REQUIP) 0.5 MG tablet TAKE 1 TABLET BY MOUTH LATE IN THE AFTERNOON AND 2 TABS AFTER DINNER  . valsartan-hydrochlorothiazide (DIOVAN-HCT) 320-25 MG per tablet Take 1 tablet by mouth  daily. High blood pressure control   No facility-administered encounter medications on file as of 06/03/2015.    Past Psychiatric History/Hospitalization(s): Anxiety: Yes Bipolar Disorder: Yes Depression: Yes Mania: Yes Psychosis: No Schizophrenia: No Personality Disorder: No Hospitalization for psychiatric illness: Yes History of Electroconvulsive Shock Therapy: No Prior Suicide Attempts: No  Physical Exam: Constitutional:  BP 137/83 mmHg  Pulse 77  Ht 5\' 5"  (1.651 m)  Wt 215 lb 12.8 oz (97.886 kg)  BMI 35.91 kg/m2  General Appearance: alert, oriented, no acute distress  Musculoskeletal: Strength & Muscle Tone: within normal limits Gait & Station: normal Patient leans: straight  Mental Status Examination/Evaluation: Objective: Attitude: Calm and cooperative  Appearance: Fairly Groomed, appears to be stated age  Engineer, water::  Fair  Speech:  Clear and Coherent and Normal Rate  Volume:  Normal  Mood:  depressed  Affect:  Depressed  Thought Process:  Intact, Linear and Logical  Orientation:  Full (Time, Place, and Person)  Thought Content:  Negative  Suicidal Thoughts:  No  Homicidal Thoughts:  No  Judgement:  Fair  Insight:  Fair   Concentration: good  Memory: Immediate-intact Recent-intact Remote-intact  Recall: fair  Language: fair  Gait and Station: normal  [employer] of Knowledge: average  Psychomotor Activity:  Normal  Akathisia:  No  Handed:  Right  AIMS (if indicated):  n/a  Assets: desire for improvement Clinical biochemist Social support      Medical Decision Making (Choose Three): Review of Psycho-Social Stressors (1), Established Problem, Worsening (2), Review of Last Therapy Session (1) and Review of Medication Regimen & Side Effects (2)  Assessment: Axis I: Bipolar d/o- current episode depressed;  PTSD; GAD Axis II: deferred   Plan:  Ativan 1mg  BID prn anxiety Lamictal 200mg  po qD (Pt was only taking 150mg  previously) for mood stabalization Continue Wellbutrin XL 300mg  po qD for depression Continue Prozac 40mg  po qD for depression and anxiety and PTSD Ritalin 5mg  po qD for mood augmentation Pt doesn't want meds changed today.   Medication management with supportive therapy. Risks/benefits and SE of the medication discussed. Pt verbalized understanding and verbal consent obtained for treatment.  Affirm with the patient that the medications are taken as ordered. Patient expressed understanding of how their medications were to be used.  -worsening of symptoms  Recommended pt obtain light box or sitting in direct sunlight for 1-2 hrs/day  Reviewed labs from 02/07/2014: CBC WNL, CMP WNL,     Therapy: brief supportive therapy provided. Discussed psychosocial stressors in detail.  Encouraged to continue individual therapy with Anderson Malta.   Pt denies SI and is at an acute low risk for suicide.Patient told to call clinic if any problems occur. Patient advised to go to ER if they should develop SI/HI, side effects, or if symptoms worsen. Has crisis numbers to call if needed. Pt verbalized understanding.  F/up in 2 months or sooner if needed  [name],  MD 06/03/2015

## 2015-07-09 ENCOUNTER — Ambulatory Visit (HOSPITAL_COMMUNITY): Payer: 59 | Admitting: Psychiatry

## 2015-07-09 DIAGNOSIS — F4310 Post-traumatic stress disorder, unspecified: Secondary | ICD-10-CM

## 2015-07-10 ENCOUNTER — Encounter: Payer: Self-pay | Admitting: Cardiology

## 2015-07-11 ENCOUNTER — Other Ambulatory Visit: Payer: Self-pay | Admitting: Pulmonary Disease

## 2015-07-11 NOTE — Progress Notes (Signed)
   THERAPIST PROGRESS NOTE   Session Time: 2:05-3:00  Participation Level: Active   Behavioral Response: CasualAlertEuthymic  Type of Therapy: Individual Therapy   Treatment Goals addressed: Anxiety   Interventions: CBT, supportive  Summary: [name] is a 70 y.o. female who presents with anxiety and depression.   Suicidal/Homicidal: Nowithout intent/plan   Therapist Response:Pt. Continues to present as talkative and engaged in the therapeutic process. Pt. Reports that she has felt increasingly depressed. Pt. Attributes her feelings of depression to ongoing relationship with her boyfriend who is diagnosed with severe mental illness and is managing his illness poorly. Pt. Shared that she has had a difficult time enforcing time and space boundaries with him. Pt. Shared her resentment toward him for not taking an active role in household chores. Pt. Also shared resentment for having responsibility for providing transportation for him daily. Pt. Shared that in the last few months she has become increasingly isolated from family and especially friends who have moved away or because of emotional or health problems. Pt. Discussed that her church is now providing a support group for persons with mental health diagnoses. Significant time in session was spent discussing patterns of isolating and what Pt. Can do now to shift those patterns such as returning to her church and joining the support group. Pt. Also discussed having conversation with her boyfriend about hiring a cleaning service so that she does not get overwhelmed by household responsibilities.   Plan: Pt. To continue with CBT based therapy and return in approximately 3 weeks.   Nancie Neas, [facility] 07/11/2015

## 2015-07-15 ENCOUNTER — Ambulatory Visit (HOSPITAL_BASED_OUTPATIENT_CLINIC_OR_DEPARTMENT_OTHER): Payer: Medicare Other | Attending: Adult Health | Admitting: Pulmonary Disease

## 2015-07-15 DIAGNOSIS — I10 Essential (primary) hypertension: Secondary | ICD-10-CM | POA: Diagnosis not present

## 2015-07-15 DIAGNOSIS — R0683 Snoring: Secondary | ICD-10-CM | POA: Insufficient documentation

## 2015-07-15 DIAGNOSIS — Z6835 Body mass index (BMI) 35.0-35.9, adult: Secondary | ICD-10-CM | POA: Insufficient documentation

## 2015-07-15 DIAGNOSIS — R5383 Other fatigue: Secondary | ICD-10-CM | POA: Diagnosis not present

## 2015-07-15 DIAGNOSIS — G4733 Obstructive sleep apnea (adult) (pediatric): Secondary | ICD-10-CM | POA: Diagnosis not present

## 2015-07-15 DIAGNOSIS — E669 Obesity, unspecified: Secondary | ICD-10-CM | POA: Insufficient documentation

## 2015-07-16 ENCOUNTER — Ambulatory Visit (HOSPITAL_COMMUNITY): Payer: Self-pay | Admitting: Psychiatry

## 2015-07-22 ENCOUNTER — Telehealth: Payer: Self-pay | Admitting: Pulmonary Disease

## 2015-07-22 DIAGNOSIS — G4733 Obstructive sleep apnea (adult) (pediatric): Secondary | ICD-10-CM

## 2015-07-22 NOTE — Telephone Encounter (Signed)
The severity of her OSA has worsened compared to 2004. Her events now 22/hour compared to 10/hour in 2004  Would recommend CPAP therapy rather than dental appliance Okay to proceed with CPAP titration study if willing

## 2015-07-22 NOTE — Telephone Encounter (Signed)
Please call pt and set up for CPAP titration study and ov with me couple weeks after study to discuss.

## 2015-07-22 NOTE — Progress Notes (Signed)
Patient Name: [name], [name] Date: 07/15/2015 Gender: Female D.O.B: [birth year] Age (years): 55 Referring Provider: Tammy Parrett Height (inches): 65 Interpreting Physician: Kara Mead MD, ABSM Weight (lbs): 212 RPSGT: Carolin Coy BMI: 35 MRN: [id number] Neck Size: 14.50   CLINICAL INFORMATION Sleep Study Type: NPSG Indication for sleep study: Fatigue, Hypertension, Obesity, OSA She does have known sleep apnea with a previous sleep test in 2004 that showed an AHI at 10 . She was intolerant of C Pap and was using a dental appliance Until she stopped about a year ago Epworth Sleepiness Score:9   SLEEP STUDY TECHNIQUE As per the AASM Manual for the Scoring of Sleep and Associated Events v2.3 (April 2016) with a hypopnea requiring 4% desaturations. The channels recorded and monitored were frontal, central and occipital EEG, electrooculogram (EOG), submentalis EMG (chin), nasal and oral airflow, thoracic and abdominal wall motion, anterior tibialis EMG, snore microphone, electrocardiogram, and pulse oximetry.  MEDICATIONS Patient's medications include: ATIVAN, REQUIP. Medications self-administered by patient during sleep study : No sleep medicine administered.   SLEEP ARCHITECTURE The study was initiated at 11:07:21 PM and ended at 5:21:44 AM. Sleep onset time was 122.5 minutes and the sleep efficiency was 50.9%. The total sleep time was 190.5 minutes. Stage REM latency was 226.5 minutes. The patient spent 10.76% of the night in stage N1 sleep, 78.22% in stage N2 sleep, 0.00% in stage N3 and 11.02% in REM. Alpha intrusion was absent. Supine sleep was 0.00%.   RESPIRATORY PARAMETERS The overall apnea/hypopnea index (AHI) was 22.7 per hour. There were 11 total apneas, including 11 obstructive, 0 central and 0 mixed apneas. There were 61 hypopneas and 33 RERAs. The AHI during Stage REM sleep was 8.6 per hour. AHI while supine was N/A per hour. The mean oxygen saturation  was 94.01%. The minimum SpO2 during sleep was 88.00%. Moderate snoring was noted during this study.   CARDIAC DATA The 2 lead EKG demonstrated sinus rhythm. The mean heart rate was 66.72 beats per minute. Other EKG findings include: None.   LEG MOVEMENT DATA The total PLMS were 75 with a resulting PLMS index of 23.62. Associated arousal with leg movement index was 2.8 .   IMPRESSIONS - Moderate obstructive sleep apnea occurred during this study (AHI = 22.7/h). - No significant central sleep apnea occurred during this study (CAI = 0.0/h). - Mild oxygen desaturation was noted during this study (Min O2 = 88.00%). - The patient snored with Moderate snoring volume. - No cardiac abnormalities were noted during this study. - Mild periodic limb movements of sleep occurred during the study. No significant associated arousals.   DIAGNOSIS - Obstructive Sleep Apnea (327.23 [G47.33 ICD-10]) -The severity has worsened compared to her sleep study in 2004 and may need CPAP therapy   RECOMMENDATIONS - Therapeutic CPAP titration to determine optimal pressure required to alleviate sleep disordered breathing. - Avoid alcohol, sedatives and other CNS depressants that may worsen sleep apnea and disrupt normal sleep architecture. - Sleep hygiene should be reviewed to assess factors that may improve sleep quality. - Weight management and regular exercise should be initiated or continued if appropriate.  Kara Mead MD. [place] Flood. [facility]   07/22/2015

## 2015-07-23 ENCOUNTER — Ambulatory Visit (INDEPENDENT_AMBULATORY_CARE_PROVIDER_SITE_OTHER): Payer: 59 | Admitting: Psychiatry

## 2015-07-23 DIAGNOSIS — F4310 Post-traumatic stress disorder, unspecified: Secondary | ICD-10-CM | POA: Diagnosis not present

## 2015-07-23 NOTE — Telephone Encounter (Signed)
LVM for pt to return call

## 2015-07-24 ENCOUNTER — Encounter (HOSPITAL_BASED_OUTPATIENT_CLINIC_OR_DEPARTMENT_OTHER): Payer: Medicare Other

## 2015-07-24 NOTE — Procedures (Signed)
Patient Name: [name], Raikes Date: 07/15/2015 Gender: Female D.O.B: [birth year] Age (years): 37 Referring Provider: Tammy Parrett Height (inches): 65 Interpreting Physician: Kara Mead MD, ABSM Weight (lbs): 212 RPSGT: Carolin Coy BMI: 35 MRN: [id number] Neck Size: 14.50   CLINICAL INFORMATION Sleep Study Type: NPSG Indication for sleep study: Fatigue, Hypertension, Obesity, OSA She does have known sleep apnea with a previous sleep test in 2004 that showed an AHI at 10 . She was intolerant of C Pap and was using a dental appliance Until she stopped about a year ago Epworth Sleepiness Score:9   SLEEP STUDY TECHNIQUE As per the AASM Manual for the Scoring of Sleep and Associated Events v2.3 (April 2016) with a hypopnea requiring 4% desaturations. The channels recorded and monitored were frontal, central and occipital EEG, electrooculogram (EOG), submentalis EMG (chin), nasal and oral airflow, thoracic and abdominal wall motion, anterior tibialis EMG, snore microphone, electrocardiogram, and pulse oximetry.  MEDICATIONS Patient's medications include: ATIVAN, REQUIP. Medications self-administered by patient during sleep study : No sleep medicine administered.   SLEEP ARCHITECTURE The study was initiated at 11:07:21 PM and ended at 5:21:44 AM. Sleep onset time was 122.5 minutes and the sleep efficiency was 50.9%. The total sleep time was 190.5 minutes. Stage REM latency was 226.5 minutes. The patient spent 10.76% of the night in stage N1 sleep, 78.22% in stage N2 sleep, 0.00% in stage N3 and 11.02% in REM. Alpha intrusion was absent. Supine sleep was 0.00%.   RESPIRATORY PARAMETERS The overall apnea/hypopnea index (AHI) was 22.7 per hour. There were 11 total apneas, including 11 obstructive, 0 central and 0 mixed apneas. There were 61 hypopneas and 33 RERAs. The AHI during Stage REM sleep was 8.6 per hour. AHI while supine was N/A per hour. The mean oxygen saturation  was 94.01%. The minimum SpO2 during sleep was 88.00%. Moderate snoring was noted during this study.   CARDIAC DATA The 2 lead EKG demonstrated sinus rhythm. The mean heart rate was 66.72 beats per minute. Other EKG findings include: None.   LEG MOVEMENT DATA The total PLMS were 75 with a resulting PLMS index of 23.62. Associated arousal with leg movement index was 2.8 .   IMPRESSIONS - Moderate obstructive sleep apnea occurred during this study (AHI = 22.7/h). - No significant central sleep apnea occurred during this study (CAI = 0.0/h). - Mild oxygen desaturation was noted during this study (Min O2 = 88.00%). - The patient snored with Moderate snoring volume. - No cardiac abnormalities were noted during this study. - Mild periodic limb movements of sleep occurred during the study. No significant associated arousals.   DIAGNOSIS - Obstructive Sleep Apnea (327.23 [G47.33 ICD-10]) -The severity has worsened compared to her sleep study in 2004 and may need CPAP therapy   RECOMMENDATIONS - Therapeutic CPAP titration to determine optimal pressure required to alleviate sleep disordered breathing. - Avoid alcohol, sedatives and other CNS depressants that may worsen sleep apnea and disrupt normal sleep architecture. - Sleep hygiene should be reviewed to assess factors that may improve sleep quality. - Weight management and regular exercise should be initiated or continued if appropriate.  Kara Mead MD. Shade Flood. [facility]   07/22/2015

## 2015-07-28 NOTE — Progress Notes (Signed)
   THERAPIST PROGRESS NOTE  Session Time: 2:05-3:00  Participation Level: Active   Behavioral Response: CasualAlertEuthymic  Type of Therapy: Individual Therapy   Treatment Goals addressed: Anxiety   Interventions: CBT, supportive  Summary: [name] is a 70 y.o. female who presents with anxiety and depression.   Suicidal/Homicidal: Nowithout intent/plan   Therapist Response:Pt. presents as talkative and engaged in the therapeutic process. Pt. Presents with improved mood compared to our last session. Pt. Reports that she made progress since our last session to setting more rigid space and time boundaries with her boyfriend. Pt. Also attributes her improved mood to change in season and SAD. Pt. Was encouraged to continue getting as much sunlight as possible. Pt. Was able to discuss strategies for getting more sun such as having her morning coffee outside and doing her daily meditation and breathing exercise outside. Pt. Reports that she attributes her change in mood to organizing in her house and has deciding to give her jewelry and cardmaking materials to volunteer organizations. Pt. Continues to express sadness related to distance in relationships with her sisters. Pt. Was encouraged to continue to reach out to them and to test assumptions about why they might not respond immediately and to resist pattern of internalizing the responses of others.   Plan: Pt. To continue with CBT based therapy and return in approximately 3 weeks.   Nancie Neas, [facility] 07/28/2015

## 2015-07-29 ENCOUNTER — Ambulatory Visit (INDEPENDENT_AMBULATORY_CARE_PROVIDER_SITE_OTHER): Payer: 59 | Admitting: Psychiatry

## 2015-07-29 DIAGNOSIS — F4310 Post-traumatic stress disorder, unspecified: Secondary | ICD-10-CM | POA: Diagnosis not present

## 2015-07-29 NOTE — Telephone Encounter (Signed)
Attempted to call [phone] but phone is no longer in use.  Called [phone] and LVM for pt to return call.

## 2015-07-30 ENCOUNTER — Ambulatory Visit (HOSPITAL_BASED_OUTPATIENT_CLINIC_OR_DEPARTMENT_OTHER): Payer: Self-pay

## 2015-07-30 NOTE — Telephone Encounter (Signed)
Pt scheduled for OV with TP on 08/21/15 at 2:30 to review CPAP Titration results.  Pt aware that someone will be calling her to schedule sleep study.   Please advise [facility] - pt needs CPAP Titration prior to August 21, 2015 Thanks.

## 2015-07-30 NOTE — Telephone Encounter (Signed)
Pt  returning call for results and can be reached @ [phone], says she never got results of sleep study.[name]

## 2015-07-30 NOTE — Telephone Encounter (Signed)
lmtcb x1 for pt. 

## 2015-07-30 NOTE — Telephone Encounter (Signed)
I spoke with [name] @the  [facility] and scheduled pt for 08/08/15.  Called and spoke with pt, she is aware & packet was mailed to the patient.

## 2015-07-31 NOTE — Progress Notes (Signed)
   THERAPIST PROGRESS NOTE  Session Time: 3:30-4:00  Participation Level: Active   Behavioral Response: CasualAlertDepressed  Type of Therapy: Individual Therapy   Treatment Goals addressed: Anxiety   Interventions: CBT, supportive  Summary: [name] is a 70 y.o. female who presents with anxiety and depression.   Suicidal/Homicidal: Nowithout intent/plan   Therapist Response:Pt. Arrived overwhelmed, tearful, depressed. Pt. Arrived a week early for her appointment stating that she was confused about the day. Pt. Was very tearful, apologetic. Pt. Reported that she has been missing appointments lately. Time in session was spent reviewing pt.'s organization. Pt. Shared that she was able to record her appointment cards in her planner that she keeps in her kitchen, but she has been too depressed to look at her planner. Counselor discussed patterns of co-dependence in current relationship and that level of caregiving for her partner was not healthy given her current level of depression. Pt. Was given the PHQ9 and scored a 21 indicating severe depression. Counselor discussed returning to IOP and Pt. Agreed that this would be good for her based on her last IOP admission. Pt agreed to return for individual session on 5/16 and to begin IOP on 5/17 and was scheduled for 9:00 assessment appointment.  Plan: Pt. To continue with CBT based therapy and return in approximately 1 weeks.   Nancie Neas, [facility] 07/31/2015

## 2015-08-05 ENCOUNTER — Ambulatory Visit (HOSPITAL_COMMUNITY): Payer: Self-pay | Admitting: Psychiatry

## 2015-08-06 ENCOUNTER — Encounter (HOSPITAL_COMMUNITY): Payer: Self-pay | Admitting: Psychiatry

## 2015-08-06 ENCOUNTER — Other Ambulatory Visit (HOSPITAL_COMMUNITY): Payer: Medicare Other | Attending: Psychiatry | Admitting: Psychiatry

## 2015-08-06 DIAGNOSIS — G2581 Restless legs syndrome: Secondary | ICD-10-CM | POA: Diagnosis not present

## 2015-08-06 DIAGNOSIS — F3130 Bipolar disorder, current episode depressed, mild or moderate severity, unspecified: Secondary | ICD-10-CM

## 2015-08-06 DIAGNOSIS — F319 Bipolar disorder, unspecified: Secondary | ICD-10-CM | POA: Insufficient documentation

## 2015-08-06 DIAGNOSIS — F4310 Post-traumatic stress disorder, unspecified: Secondary | ICD-10-CM

## 2015-08-06 DIAGNOSIS — F411 Generalized anxiety disorder: Secondary | ICD-10-CM
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
## 2015-08-06 NOTE — Progress Notes (Signed)
    Daily Group Progress Note  Program: IOP  Group Time: 9:00-10:30  Participation Level: Active  Behavioral Response: Appropriate  Type of Therapy:  Group Therapy  Summary of Progress: Pt.'s first day in group and completed intake assessment with case manager and psychiatrist. Pt. Introduced herself to the group and expressed gratitude to the group for being able to get treatment for her depression because she was aware that she was getting worse.      Group Time: 10:30-12:00  Participation Level:  Active  Behavioral Response: Appropriate  Type of Therapy: Psycho-education Group  Summary of Progress: Pt. Participated in yoga therapy with Jan Fireman, LPC.  Nancie Neas, LPC

## 2015-08-06 NOTE — Progress Notes (Signed)
Psychiatric Initial Adult Assessment   Patient Identification: [name] MRN:  [id number] Date of Evaluation:  08/06/2015 Referral Source: Dr Owens Shark Chief Complaint:   Chief Complaint    Depression; Anxiety; Stress     Visit Diagnosis:    ICD-9-CM ICD-10-CM   1. Bipolar I disorder, most recent episode depressed ([place]) 296.50 F31.30   2. PTSD (post-traumatic stress disorder) 309.81 F43.10   3. Generalized anxiety disorder 300.02 F41.1     History of Present Illness:  [name] has a long history of anxiety and depression with a diagnosis of bipolar disorder.  She recently has become more depressed to the point of daily crying, sadness, decreased motivation, energy and interest, increased irritation, increased anxiety.  No suicidal thoughts though she would be ready to die and be in heaven.  Stressors remain estrangement from her entire family, finances, her current fiance who is wonderful but has a mood disorder himself.    Associated Signs/Symptoms: Depression Symptoms:  depressed mood, hypersomnia, fatigue, difficulty concentrating, impaired memory, anxiety, (Hypo) Manic Symptoms:  Irritable Mood, Anxiety Symptoms:  Excessive Worry, Psychotic Symptoms:  none PTSD Symptoms: Negative  Past Psychiatric History: general anxiety and PTSD, previous inpatient stays and years of outpatient therapy as well as previous IOP stay, has gotten benefit from this therapy she says  Previous Psychotropic Medications: Yes   Substance Abuse History in the last 12 months:  No.  Consequences of Substance Abuse: Negative  Past Medical History:  Past Medical History  Diagnosis Date  . Abscess of Bartholin's gland   . History of pleural effusion   . History of attention deficit disorder   . History of endometriosis   . RLS (restless legs syndrome)   . Anxiety   . Obesity   . GERD (gastroesophageal reflux disease)   . Renal mass 02/15/2012  . Depression     Bipolar disorder/goes  to [facility] for meds  . OSA (obstructive sleep apnea)     uses oral appliance instead of CPAP  . Vertigo   . Chronic kidney disease     kidney cancer- pt states she has elected to not have it treated.  Marland Kitchen PONV (postoperative nausea and vomiting)   . Difficult intubation     told by [facility] that she was hard to intubate 15b yrs ago in [place]- surgery since then no problems  . Adverse effect of general anesthetic     felt paralyzed while receiving anesthesia  . Bipolar 1 disorder ([place])   . PTSD (post-traumatic stress disorder)   . ADHD (attention deficit hyperactivity disorder)   . Migraines   . rt renal ca dx'd 12/2009    no treatment/ no surg  . Acute vestibular neuronitis     Dr Lucia Gaskins  . RLS (restless legs syndrome)     Dr Gwenette Greet  . H/O echocardiogram 07/2012    Normla LVF w grade I siastolic dysfunction   . PVC's (premature ventricular contractions)   . Hypertension   . 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 Dysrhythmia     h/o atrial tachycardia- s/p ablation     Past Surgical History  Procedure Laterality Date  . Thyroidectomy  1990  . Tubal ligation  1970s  . Nasoseptal reconstruction  1990s  . Cardiac electrophysiology mapping and ablation  2000s  . Tonsillectomy      as a child  . Uterine mass removal  03/2012    was found to be benign  Family Psychiatric History: depression and alcohol abuse throughout family  Family History:  Family History  Problem Relation Age of Onset  . Allergies Father   . Skin cancer Father   . Bipolar disorder Father   . Alcohol abuse Father   . Allergies Sister     multiple  . Allergies Brother     multiple  . Allergies Daughter   . Heart disease Mother   . Depression Mother   . Hypertension Mother   . CAD Mother   . Heart disease Maternal Grandfather   . Colon cancer Paternal Grandmother   . OCD Paternal Grandmother   . Cancer Paternal Grandfather     Social History:   Social  History   Social History  . Marital Status: Single    Spouse Name: N/A  . Number of Children: N/A  . Years of Education: N/A   Occupational History  . 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 Smoking status: Never Smoker   . Smokeless tobacco: Never Used  . Alcohol Use: No  . Drug Use: No  . Sexual Activity: Yes    Birth Control/ Protection: None   Other Topics Concern  . None   Social History Narrative   Divorced and lives alone   Has children    Additional Social History: none  Allergies:   Allergies  Allergen Reactions  . Abilify [Aripiprazole] Other (See Comments)    jerking  . Compazine [Prochlorperazine Edisylate] Nausea And Vomiting  . Diflucan [Fluconazole] Nausea Only    HA  . Geodon [Ziprasidone Hydrochloride] Other (See Comments)    Extremely aggitated  . Lithium Nausea Only and Other (See Comments)    Off balance, increased heart rate  . Talwin [Pentazocine] Other (See Comments)    Chest pain  . Toradol [Ketorolac Tromethamine] Other (See Comments)    hallucinations    Metabolic Disorder Labs: No results found for: HGBA1C, MPG No results found for: PROLACTIN Lab Results  Component Value Date   CHOL 167 04/21/2013   TRIG 106 04/21/2013   HDL 44 04/21/2013   CHOLHDL 3.8 04/21/2013   VLDL 21 04/21/2013   LDLCALC 102* 04/21/2013     Current Medications: Current Outpatient Prescriptions  Medication Sig Dispense Refill  . aspirin 81 MG EC tablet Take 1 tablet (81 mg total) by mouth daily. For heart health 30 tablet 1  . buPROPion (WELLBUTRIN XL) 300 MG 24 hr tablet TAKE 1 TABLET (300 MG TOTAL) BY MOUTH DAILY. FOR DEPRESSION 90 tablet 0  . butalbital-acetaminophen-caffeine (FIORICET, ESGIC) 50-325-40 MG per tablet Take 1 tablet by mouth every 6 (six) hours as needed for headache.   0  . esomeprazole (NEXIUM) 40 MG capsule Take 40 mg by mouth as needed. GERD  3  . FLUoxetine (PROZAC) 40 MG capsule Take 1 capsule  (40 mg total) by mouth daily. 90 capsule 0  . fluticasone (FLONASE) 50 MCG/ACT nasal spray Place 1 spray into both nostrils as needed. Sinus problems  2  . lamoTRIgine (LAMICTAL) 200 MG tablet Take 1 tablet (200 mg total) by mouth daily. 90 tablet 0  . levothyroxine (SYNTHROID, LEVOTHROID) 88 MCG tablet Take 1 tablet (88 mcg total) by mouth daily before breakfast. For low thyroid function (Patient taking differently: Take 100 mcg by mouth daily before breakfast. For low thyroid function)    . LORazepam (ATIVAN) 1 MG tablet Take 1 tablet (1 mg total) by mouth 2 (two) times daily as needed  for anxiety. 180 tablet 0  . methylphenidate (RITALIN) 10 MG tablet Take 0.5 tablets (5 mg total) by mouth once. 15 tablet 0  . methylphenidate (RITALIN) 5 MG tablet Take 1 tablet (5 mg total) by mouth daily. 30 tablet 0  . methylphenidate (RITALIN) 5 MG tablet Take 1 tablet (5 mg total) by mouth daily. 30 tablet 0  . nebivolol (BYSTOLIC) 2.5 MG tablet Take 1 tablet (2.5 mg total) by mouth daily. 90 tablet 3  . rOPINIRole (REQUIP) 0.5 MG tablet Take 1 tablet po in the afternoon and 1 tablet po qhs 90 tablet 0  . valsartan-hydrochlorothiazide (DIOVAN-HCT) 320-25 MG per tablet Take 1 tablet by mouth daily. High blood pressure control 30 tablet 0   No current facility-administered medications for this visit.    Neurologic: Headache: Negative Seizure: Negative Paresthesias:Negative  Musculoskeletal: Strength & Muscle Tone: within normal limits Gait & Station: normal Patient leans: N/A  Psychiatric Specialty Exam: ROS  There were no vitals taken for this visit.There is no weight on file to calculate BMI.  General Appearance: Well Groomed  Eye Contact:  Good  Speech:  Clear and Coherent  Volume:  Normal  Mood:  Depressed  Affect:  Congruent  Thought Process:  Coherent and Logical  Orientation:  Full (Time, Place, and Person)  Thought Content:  Negative  Suicidal Thoughts:  No  Homicidal Thoughts:  No   Memory:  Immediate;   Good Recent;   Good Remote;   Good  Judgement:  Good  Insight:  Good  Psychomotor Activity:  Normal  Concentration:  Good  Recall:  Good  Fund of Knowledge:Good  Language: Good  Akathisia:  Negative  Handed:  Right  AIMS (if indicated):  0  Assets:  Communication Skills Desire for Improvement Financial Resources/Insurance Housing Intimacy Leisure Time Physical Health Resilience Social Support Talents/Skills Transportation Vocational/Educational  ADL's:  Intact  Cognition: WNL  Sleep:  Extra need for sleep    Treatment Plan Summary: daily group therapy   [name], MD 5/17/20172:17 PM

## 2015-08-07 ENCOUNTER — Other Ambulatory Visit (HOSPITAL_COMMUNITY): Payer: Medicare Other | Admitting: Psychiatry

## 2015-08-07 ENCOUNTER — Ambulatory Visit (HOSPITAL_COMMUNITY): Payer: Self-pay | Admitting: Psychiatry

## 2015-08-07 DIAGNOSIS — F319 Bipolar disorder, unspecified: Secondary | ICD-10-CM | POA: Diagnosis not present

## 2015-08-07 DIAGNOSIS — F3130 Bipolar disorder, current episode depressed, mild or moderate severity, unspecified: Secondary | ICD-10-CM

## 2015-08-08 ENCOUNTER — Other Ambulatory Visit (HOSPITAL_COMMUNITY): Payer: Medicare Other | Admitting: Psychiatry

## 2015-08-08 ENCOUNTER — Ambulatory Visit (HOSPITAL_BASED_OUTPATIENT_CLINIC_OR_DEPARTMENT_OTHER): Payer: Medicare Other

## 2015-08-08 DIAGNOSIS — F319 Bipolar disorder, unspecified: Secondary | ICD-10-CM | POA: Diagnosis not present

## 2015-08-08 DIAGNOSIS — F3130 Bipolar disorder, current episode depressed, mild or moderate severity, unspecified: Secondary | ICD-10-CM

## 2015-08-08 NOTE — Progress Notes (Signed)
    Daily Group Progress Note  Program: IOP  Group Time: 9:00-12:00  Participation Level: Active  Behavioral Response: Appropriate  Type of Therapy:  Group Therapy  Summary of Progress: Pt. Presents as talkative, alert, engaged in the group process. Pt. Participated in discussion about the role of social support in the therapeutic process.     Eloise Levels, Ph.D., [facility]

## 2015-08-11 ENCOUNTER — Other Ambulatory Visit (HOSPITAL_COMMUNITY): Payer: Medicare Other | Admitting: Psychiatry

## 2015-08-11 DIAGNOSIS — F3130 Bipolar disorder, current episode depressed, mild or moderate severity, unspecified: Secondary | ICD-10-CM

## 2015-08-11 DIAGNOSIS — F319 Bipolar disorder, unspecified: Secondary | ICD-10-CM | POA: Diagnosis not present

## 2015-08-11 NOTE — Progress Notes (Signed)
    Daily Group Progress Note  Program: IOP  Group Time:9-10:45am  Participation Level: Active  Behavioral Response: Appropriate  Type of Therapy:  Psycho-education Group  Summary of Progress: Pt participated in a group facilitated by [facility] Pharmacist     Group Time: 10:45-12  Participation Level:  Active  Behavioral Response: Appropriate  Type of Therapy: Group Therapy  Summary of Progress: Pt participated with flat affect but was appropriate in discussion. She reports she was in bed all weekend. Pt participated in a discussion about isolating self during depression.  Matix Henshaw S, Licensed Cli

## 2015-08-12 ENCOUNTER — Other Ambulatory Visit (HOSPITAL_COMMUNITY): Payer: Medicare Other | Admitting: Licensed Clinical Social Worker

## 2015-08-12 DIAGNOSIS — F319 Bipolar disorder, unspecified: Secondary | ICD-10-CM | POA: Diagnosis not present

## 2015-08-12 DIAGNOSIS — F3130 Bipolar disorder, current episode depressed, mild or moderate severity, unspecified: Secondary | ICD-10-CM

## 2015-08-12 DIAGNOSIS — F411 Generalized anxiety disorder: Secondary | ICD-10-CM

## 2015-08-12 NOTE — Progress Notes (Signed)
    Daily Group Progress Note  Program: IOP  Group Time: 10:30-12:00  Participation Level: Active  Behavioral Response: Appropriate  Type of Therapy:  Group Therapy  Summary of Progress: Pt. Presented as talkative, engaged in the group process. Pt. Shared her history of her father's alcohol abuse and how it affected her emotionally with expressing her anger and need to emotionally care for her mother. Pt. Also dicussed the pain of feeling disconnected from there family and losses of friends and social connection over the past year.     Group Time: 10:30-12:00  Participation Level:  Active  Behavioral Response: Appropriate  Type of Therapy: Psycho-education Group  Summary of Progress: Pt. Participated in grief and loss group with the Chaplain.  Nancie Neas, LPC

## 2015-08-12 NOTE — Progress Notes (Signed)
    Daily Group Progress Note  Program: IOP  Group Time: 10:45-12:00  Participation Level: Active  Behavioral Response: Appropriate  Type of Therapy:  Psycho-education Group  Summary of Progress: Pt participated in a discussion on "What does my depression personally look like." Pt participated in activity on identifying personal depression symptoms. Pt participated in a discussion about separating who she is from her depression.         Everitt Wenner S, Licensed Cli

## 2015-08-13 ENCOUNTER — Other Ambulatory Visit (HOSPITAL_COMMUNITY): Payer: Medicare Other | Admitting: Psychiatry

## 2015-08-13 DIAGNOSIS — F319 Bipolar disorder, unspecified: Secondary | ICD-10-CM | POA: Diagnosis not present

## 2015-08-13 DIAGNOSIS — F3130 Bipolar disorder, current episode depressed, mild or moderate severity, unspecified: Secondary | ICD-10-CM

## 2015-08-13 NOTE — Progress Notes (Signed)
    Daily Group Progress Note  Program: IOP  Group Time: 9:00-10:30  Participation Level: Active  Behavioral Response: Appropriate  Type of Therapy:  Group Therapy  Summary of Progress: Pt. Presented as talkative, engaged in the group process. Pt. Shared that she overslept, felt disorganized. Pt. Shared her experience with seeking support from the mental health association and support groups. Pt. Encouraged fellow group members to go to several groups and find one that feels comfortable.

## 2015-08-14 ENCOUNTER — Other Ambulatory Visit (HOSPITAL_COMMUNITY): Payer: Medicare Other | Admitting: Psychiatry

## 2015-08-14 DIAGNOSIS — F3130 Bipolar disorder, current episode depressed, mild or moderate severity, unspecified: Secondary | ICD-10-CM

## 2015-08-14 DIAGNOSIS — F319 Bipolar disorder, unspecified: Secondary | ICD-10-CM | POA: Diagnosis not present

## 2015-08-14 NOTE — Progress Notes (Signed)
    Daily Group Progress Note  Program: IOP  Group Time: 9:00-12:00  Participation Level: Active  Behavioral Response: Appropriate  Type of Therapy:  Group Therapy  Summary of Progress: Pt. Came to group 30 minutes late, reported to group that she overslept and has trouble organizing herself in the morning. Pt. Discussed problems that she is having with organizing her home and setting healthy boundaries in relationship with her fiance.    Nancie Neas, LPC

## 2015-08-15 ENCOUNTER — Other Ambulatory Visit (HOSPITAL_COMMUNITY): Payer: Medicare Other | Admitting: Psychiatry

## 2015-08-15 DIAGNOSIS — F319 Bipolar disorder, unspecified: Secondary | ICD-10-CM | POA: Diagnosis not present

## 2015-08-15 DIAGNOSIS — F3130 Bipolar disorder, current episode depressed, mild or moderate severity, unspecified: Secondary | ICD-10-CM

## 2015-08-15 NOTE — Progress Notes (Signed)
    Daily Group Progress Note  Program: IOP  Group Time: 9:00-12:00  Participation Level: Active  Behavioral Response: Appropriate  Type of Therapy:  Group Therapy  Summary of Progress: Pt. Presented as talkative, engaged in the group process. Pt. Shared experience of losing relationship with her daughter and associated grief. Pt. Discussed challenge of setting and enforcing healthy boundaries in relationship with significant other and how lack of boundaries is contributing to her anxiety and depression.     Eloise Levels, Ph.D., [facility]

## 2015-08-15 NOTE — Progress Notes (Signed)
    Daily Group Progress Note  Program: IOP  Group Time: 9:00-12:00  Participation Level: Active  Behavioral Response: Appropriate  Type of Therapy:  Group Therapy   Summary of Progress: Client discussed her connection to her belongings and her worry about what will happen after she passes away. Client was encouraged to give her most prized possessions to people that will appreciate them while she is still living.  Client stated she feels this will help her feel less anxious.  Client discussed the disarray in her apartment brings her anxiety each day.  Organizational methods, such as the use of baskets and bins, were discussed.  She mentioned she has an extensive collection of stuff pertaining to hobbies she has not worked on for more than a year.  This clutter exacerbates the anxiety.  Donating the goods to a local organization that could use the materials was discussed.  The client appeared open to the idea.  Taking small steps towards the organization of her apartment and donations was recommended.  Working on the clutter for five minutes each day.  Then working up to ten minutes, and so on.  Client appeared open and ready to start using small steps.   Nancie Neas, LPC

## 2015-08-19 ENCOUNTER — Telehealth (HOSPITAL_COMMUNITY): Payer: Self-pay | Admitting: Psychiatry

## 2015-08-19 ENCOUNTER — Other Ambulatory Visit (HOSPITAL_COMMUNITY): Payer: Medicare Other | Admitting: Psychiatry

## 2015-08-20 ENCOUNTER — Other Ambulatory Visit (HOSPITAL_COMMUNITY): Payer: Medicare Other | Admitting: Psychiatry

## 2015-08-21 ENCOUNTER — Ambulatory Visit: Payer: Self-pay | Admitting: Adult Health

## 2015-08-21 ENCOUNTER — Other Ambulatory Visit (HOSPITAL_COMMUNITY): Payer: 59 | Admitting: Psychiatry

## 2015-08-22 ENCOUNTER — Other Ambulatory Visit (HOSPITAL_COMMUNITY): Payer: 59 | Admitting: Psychiatry

## 2015-08-23 ENCOUNTER — Encounter (HOSPITAL_BASED_OUTPATIENT_CLINIC_OR_DEPARTMENT_OTHER): Payer: Medicare Other

## 2015-08-25 ENCOUNTER — Other Ambulatory Visit (HOSPITAL_COMMUNITY): Payer: 59 | Admitting: Psychiatry

## 2015-08-25 ENCOUNTER — Ambulatory Visit: Payer: Self-pay | Admitting: Adult Health

## 2015-08-26 ENCOUNTER — Other Ambulatory Visit (HOSPITAL_COMMUNITY): Payer: 59

## 2015-08-26 ENCOUNTER — Ambulatory Visit: Payer: Self-pay | Admitting: Adult Health

## 2015-08-27 ENCOUNTER — Other Ambulatory Visit (HOSPITAL_COMMUNITY): Payer: 59

## 2015-08-28 ENCOUNTER — Other Ambulatory Visit (HOSPITAL_COMMUNITY): Payer: 59

## 2015-08-29 ENCOUNTER — Other Ambulatory Visit (HOSPITAL_COMMUNITY): Payer: 59 | Admitting: Psychiatry

## 2015-09-01 ENCOUNTER — Other Ambulatory Visit (HOSPITAL_COMMUNITY): Payer: 59

## 2015-09-02 ENCOUNTER — Other Ambulatory Visit (HOSPITAL_COMMUNITY): Payer: 59 | Admitting: Psychiatry

## 2015-09-03 ENCOUNTER — Other Ambulatory Visit (HOSPITAL_COMMUNITY): Payer: 59 | Attending: Psychiatry | Admitting: Licensed Clinical Social Worker

## 2015-09-03 DIAGNOSIS — F4310 Post-traumatic stress disorder, unspecified: Secondary | ICD-10-CM | POA: Diagnosis not present

## 2015-09-03 DIAGNOSIS — F319 Bipolar disorder, unspecified: Secondary | ICD-10-CM | POA: Diagnosis not present

## 2015-09-03 DIAGNOSIS — F3130 Bipolar disorder, current episode depressed, mild or moderate severity, unspecified: Secondary | ICD-10-CM

## 2015-09-03 DIAGNOSIS — F411 Generalized anxiety disorder: Secondary | ICD-10-CM | POA: Diagnosis not present

## 2015-09-03 NOTE — Progress Notes (Signed)
    Daily Group Progress Note  Program: IOP  Group Time: 9:15-10:45 Participation Level: active Behavioral Response: engaged, responsive Type: Group Therapy Summary:  Patient mentioned that she is going to function "as she is." She mentioned how reflecting on journal entries from the past, had helped her see the pattern of her depression. Therapist encouraged group to journal consistently to remember good days, and for help on bad days.   Nancie Neas, LPC

## 2015-09-04 ENCOUNTER — Other Ambulatory Visit (HOSPITAL_COMMUNITY): Payer: 59 | Admitting: Psychiatry

## 2015-09-04 DIAGNOSIS — F4310 Post-traumatic stress disorder, unspecified: Secondary | ICD-10-CM

## 2015-09-04 DIAGNOSIS — F3130 Bipolar disorder, current episode depressed, mild or moderate severity, unspecified: Secondary | ICD-10-CM

## 2015-09-04 DIAGNOSIS — F319 Bipolar disorder, unspecified: Secondary | ICD-10-CM | POA: Diagnosis not present

## 2015-09-04 DIAGNOSIS — F411 Generalized anxiety disorder: Secondary | ICD-10-CM

## 2015-09-04 MED ORDER — LORAZEPAM 1 MG PO TABS: 1.0000 mg | ORAL_TABLET | Freq: Two times a day (BID) | ORAL | Status: DC | PRN

## 2015-09-04 MED ORDER — FLUOXETINE HCL 40 MG PO CAPS: 40.0000 mg | ORAL_CAPSULE | Freq: Every day | ORAL | Status: DC

## 2015-09-04 MED ORDER — BUPROPION HCL ER (XL) 300 MG PO TB24: ORAL_TABLET | ORAL | Status: DC

## 2015-09-04 MED ORDER — METHYLPHENIDATE HCL 5 MG PO TABS: 5.0000 mg | ORAL_TABLET | Freq: Every day | ORAL | Status: DC

## 2015-09-04 NOTE — Progress Notes (Signed)
    Daily Group Progress Note  Program: IOP Group Time: 9:00-12:00   Participation Level:  active   Behavioral Response: engaged   Type of Therapy:  group therapy   Summary of Progress: The client was well dressed and engaged in group.  Client stated she feels "all over the place."  She discussed the long list of things she needs to do which is overwhelming and causing anxiety.  Therapist encouraged client to break tasks into smaller, more manageable pieces so tasks do not feel so overwhelming.  Client was also encouraged to shift her expectations of what she "should" be or do.  Nancie Neas, LPC

## 2015-09-04 NOTE — Progress Notes (Signed)
Patient ID: [name], female   DOB: 04/06/1945, 70 y.o.   MRN: [id number] Discharge Note  Patient:  [name] is an 70 y.o., female DOB:  1945-10-09  Date of Admission:  08/06/2015  Date of Discharge:  09/05/2015  Reason for Admission:depression  IOP Course:  [name] attended and participated.  She was out for over a week because of no transportation.  She says the group was helpful and she is still learning coping skills after all these years of therapy.  Still anxious and depressed but feeling much better, she says.  Mental Status at Discharge:no suicidal thoughts, anxiety and depression present but manageable level  Lab Results: No results found for this or any previous visit (from the past 48 hour(s)).   Current outpatient prescriptions:  .  aspirin 81 MG EC tablet, Take 1 tablet (81 mg total) by mouth daily. For heart health, Disp: 30 tablet, Rfl: 1 .  buPROPion (WELLBUTRIN XL) 300 MG 24 hr tablet, TAKE 1 TABLET (300 MG TOTAL) BY MOUTH DAILY. FOR DEPRESSION, Disp: 90 tablet, Rfl: 0 .  butalbital-acetaminophen-caffeine (FIORICET, ESGIC) 50-325-40 MG per tablet, Take 1 tablet by mouth every 6 (six) hours as needed for headache. , Disp: , Rfl: 0 .  esomeprazole (NEXIUM) 40 MG capsule, Take 40 mg by mouth as needed. GERD, Disp: , Rfl: 3 .  FLUoxetine (PROZAC) 40 MG capsule, Take 1 capsule (40 mg total) by mouth daily., Disp: 90 capsule, Rfl: 0 .  fluticasone (FLONASE) 50 MCG/ACT nasal spray, Place 1 spray into both nostrils as needed. Sinus problems, Disp: , Rfl: 2 .  lamoTRIgine (LAMICTAL) 200 MG tablet, Take 1 tablet (200 mg total) by mouth daily., Disp: 90 tablet, Rfl: 0 .  levothyroxine (SYNTHROID, LEVOTHROID) 88 MCG tablet, Take 1 tablet (88 mcg total) by mouth daily before breakfast. For low thyroid function (Patient taking differently: Take 100 mcg by mouth daily before breakfast. For low thyroid function), Disp: , Rfl:  .  LORazepam (ATIVAN) 1 MG tablet, Take 1  tablet (1 mg total) by mouth 2 (two) times daily as needed for anxiety., Disp: 180 tablet, Rfl: 0 .  methylphenidate (RITALIN) 10 MG tablet, Take 0.5 tablets (5 mg total) by mouth once., Disp: 15 tablet, Rfl: 0 .  methylphenidate (RITALIN) 5 MG tablet, Take 1 tablet (5 mg total) by mouth daily., Disp: 30 tablet, Rfl: 0 .  methylphenidate (RITALIN) 5 MG tablet, Take 1 tablet (5 mg total) by mouth daily., Disp: 30 tablet, Rfl: 0 .  nebivolol (BYSTOLIC) 2.5 MG tablet, Take 1 tablet (2.5 mg total) by mouth daily., Disp: 90 tablet, Rfl: 3 .  rOPINIRole (REQUIP) 0.5 MG tablet, Take 1 tablet po in the afternoon and 1 tablet po qhs, Disp: 90 tablet, Rfl: 0 .  valsartan-hydrochlorothiazide (DIOVAN-HCT) 320-25 MG per tablet, Take 1 tablet by mouth daily. High blood pressure control, Disp: 30 tablet, Rfl: 0  Axis Diagnosis:  Bipolar 1 disorder most recent episode depressed.  PTSD.  Generalized anxiety disorder   Level of Care:  IOP  Discharge destination:  Other:  has appointments with therapist and psychiatrist  Is patient on multiple antipsychotic therapies at discharge:  No    Has Patient had three or more failed trials of antipsychotic monotherapy by history:  Negative  Patient phone:  [phone] (home)  Patient address:   [place] [address],   Follow-up recommendations:  Activity:  continue current activity Diet:  continue current diet  Comments:  none  The patient received suicide prevention pamphlet:  Yes   [name] 09/04/2015, 11:16 AM

## 2015-09-05 ENCOUNTER — Other Ambulatory Visit (HOSPITAL_COMMUNITY): Payer: 59 | Admitting: Psychiatry

## 2015-09-08 ENCOUNTER — Other Ambulatory Visit (HOSPITAL_COMMUNITY): Payer: 59 | Admitting: Licensed Clinical Social Worker

## 2015-09-08 ENCOUNTER — Ambulatory Visit (HOSPITAL_COMMUNITY): Payer: Self-pay | Admitting: Psychiatry

## 2015-09-08 DIAGNOSIS — F3130 Bipolar disorder, current episode depressed, mild or moderate severity, unspecified: Secondary | ICD-10-CM

## 2015-09-08 DIAGNOSIS — F319 Bipolar disorder, unspecified: Secondary | ICD-10-CM | POA: Diagnosis not present

## 2015-09-08 NOTE — Progress Notes (Signed)
Daily Group Progress Note  Program: IOP  Group Time: 9:00-10:45am  Participation Level: Active  Behavioral Response: Appropriate  Type of Therapy:  Psychoeducation  Summary of Progress: Pt. was present for presentation by [facility] Pharmacist on medication management.    Group Time: 10:45-12:00pm  Participation Level: Active  Behavioral Response: Appropriate  Type of Therapy:  Group Therapy  Summary of Progress: Pt presented as talkative, engaged in the group process. Pt participated in a discussion on open communication. Pt has begun communication with her estranged sister. She was encouraged to use her effective communication skills, such as I statements, when beginning the new relationship with her sister. Pt completes the program today.

## 2015-09-08 NOTE — Progress Notes (Signed)
[name] is a 70 y.o. , divorced, Caucasian female, who was referred per therapist Eloise Levels, PhD), treatment for worsening depressive and anxiety symptoms with passive SI. Pt denied a plan or intent. Pt is well known to this Probation officer. CC: previous MH-IOP chart/notes for history. Stressors: 1) Conflictual relationship with fiance'. According to pt, they were reunited ~ one yr ago. "We dated yrs ago." He is dx with Schizo-affective D/O. "He is difficult to deal with at times." Pt states he was hospitalized several times last yr. Pt reports he inherited money from a trust fund. He has asked pt to marry him and she said "yes." According to pt, they will keep separate homes. Pt states she continues to worry about her troublesome daughter. "She still doesn't talk to me, nor anyone in my family." Pt also reports her best friend just recently moved to [place]. 2) Renal Cancer. Pt is refusing any treatment for it. Family hx: Father (ETOH).  Pt completed MH-IOP, after missing quite a few days due to no transportation.  States overall mood is improving.  Reports increased energy.  "I'm able to get up and go and be more active now."  A:  D/C today.  F/U with Dr. Doyne Keel on 11-06-15 @ 4:15 pm and Eloise Levels, PhD on 09-08-15 @ 2 pm.  Strongly encouraged support groups; especially at [facility].  R:  Pt receptive.     Carlis Abbott, RITA, M.Ed, CNA

## 2015-09-08 NOTE — Patient Instructions (Signed)
Patient completed MH-IOP today.  Will follow up with Eloise Levels, PhD on 09-08-15 @ 2 pm and Dr. Doyne Keel on 11-06-15 @ 4:15 pm.  Encouraged support groups.

## 2015-09-09 ENCOUNTER — Other Ambulatory Visit (HOSPITAL_COMMUNITY): Payer: 59

## 2015-09-10 ENCOUNTER — Other Ambulatory Visit (HOSPITAL_COMMUNITY): Payer: 59

## 2015-09-11 ENCOUNTER — Other Ambulatory Visit: Payer: Self-pay | Admitting: Cardiology

## 2015-09-11 ENCOUNTER — Other Ambulatory Visit (HOSPITAL_COMMUNITY): Payer: 59

## 2015-09-12 ENCOUNTER — Other Ambulatory Visit (HOSPITAL_COMMUNITY): Payer: 59

## 2015-09-15 ENCOUNTER — Ambulatory Visit (HOSPITAL_COMMUNITY): Payer: 59 | Admitting: Psychiatry

## 2015-09-15 ENCOUNTER — Other Ambulatory Visit (HOSPITAL_COMMUNITY): Payer: 59

## 2015-09-15 DIAGNOSIS — F3130 Bipolar disorder, current episode depressed, mild or moderate severity, unspecified: Secondary | ICD-10-CM

## 2015-09-16 ENCOUNTER — Other Ambulatory Visit (HOSPITAL_COMMUNITY): Payer: 59

## 2015-09-16 NOTE — Progress Notes (Signed)
   THERAPIST PROGRESS NOTE  Session Time: 1:10-2:00  Participation Level: Active   Behavioral Response: CasualAlertEuthymic  Type of Therapy: Individual Therapy   Treatment Goals addressed: Depression  Interventions: CBT, supportive  Summary: [name] is a 70 y.o. female who presents with anxiety and depression.   Suicidal/Homicidal: Nowithout intent/plan   Therapist Response:Pt. Presented for first session since discharge from IOP program. Pt. Reported that her depression symptoms have been significantly reduced and that the IOP program was good for her. Pt. Reported that she was able to refocus herself on developing healthy boundaries in her romantic relationship and is clearer about how the poor boundaries are affecting her depression and generally feeling overwhelmed by her life. Pt. Reported feelings of "disgust" related to cleaning her kitchen i.e., dirty dish water and rotting food in her refrigerator. These feelings were normalized for the Pt., therapist spent time explaining that feeling disgusted or turned off by these areas of her life are normal and explored the purpose of the feeling to motivate herself to work on those areas of her life. Pt. Was encouraged to work on areas of her home for a minimum of 10 minutes everyday. On days that she feels that she could do more, she is to work no more than one hour. Pt.'s goal of 10 minutes of day is focused on pattern of all-or-nothing thinking and how this cognitive distortion keeps her stuck and not able to complete the mundane tasks in her life.  Plan: Pt. To continue with CBT based therapy and return in approximately 2 weeks.   Nancie Neas, [facility] 09/16/2015

## 2015-09-17 ENCOUNTER — Other Ambulatory Visit (HOSPITAL_COMMUNITY): Payer: 59

## 2015-09-18 ENCOUNTER — Other Ambulatory Visit (HOSPITAL_COMMUNITY): Payer: 59

## 2015-09-19 ENCOUNTER — Other Ambulatory Visit (HOSPITAL_COMMUNITY): Payer: 59

## 2015-09-21 ENCOUNTER — Ambulatory Visit (HOSPITAL_BASED_OUTPATIENT_CLINIC_OR_DEPARTMENT_OTHER): Payer: Medicare Other | Attending: Adult Health | Admitting: Pulmonary Disease

## 2015-09-21 VITALS — Ht 65.0 in | Wt 212.0 lb

## 2015-09-21 DIAGNOSIS — Z79899 Other long term (current) drug therapy: Secondary | ICD-10-CM | POA: Insufficient documentation

## 2015-09-21 DIAGNOSIS — G4733 Obstructive sleep apnea (adult) (pediatric): Secondary | ICD-10-CM | POA: Insufficient documentation

## 2015-09-21 DIAGNOSIS — G4761 Periodic limb movement disorder: Secondary | ICD-10-CM | POA: Insufficient documentation

## 2015-09-21 DIAGNOSIS — R0683 Snoring: Secondary | ICD-10-CM | POA: Diagnosis not present

## 2015-09-21 DIAGNOSIS — G4730 Sleep apnea, unspecified: Secondary | ICD-10-CM | POA: Diagnosis present

## 2015-09-22 ENCOUNTER — Other Ambulatory Visit (HOSPITAL_COMMUNITY): Payer: 59

## 2015-09-24 ENCOUNTER — Telehealth: Payer: Self-pay | Admitting: Pulmonary Disease

## 2015-09-24 ENCOUNTER — Other Ambulatory Visit (HOSPITAL_COMMUNITY): Payer: 59

## 2015-09-24 DIAGNOSIS — G4733 Obstructive sleep apnea (adult) (pediatric): Secondary | ICD-10-CM | POA: Diagnosis not present

## 2015-09-24 NOTE — Telephone Encounter (Signed)
If pt willing to start CPAP , send RX for  CPAP therapy on 9 cm H2O with a Medium size Resmed Full Face Mask AirFit F20 mask and heated humidification.  DL in 4 wks OV with TP in 6

## 2015-09-24 NOTE — Procedures (Signed)
Patient Name: [name], [name] Date: 09/21/2015 Gender: Female D.O.B: 02-07-1946 Age (years): 46 Referring Provider: Tammy Parrett Height (inches): 65 Interpreting Physician: Kara Mead MD, ABSM Weight (lbs): 212 RPSGT: Joni Reining BMI: 35 MRN: [id number] Neck Size: 14.50   CLINICAL INFORMATION The patient is referred for a CPAP titration to treat sleep apnea. Date of NPSG:06/2015 RDI 22/hour compared to 10/hour in 2004   [place] As per the AASM Manual for the Scoring of Sleep and Associated Events v2.3 (April 2016) with a hypopnea requiring 4% desaturations. The channels recorded and monitored were frontal, central and occipital EEG, electrooculogram (EOG), submentalis EMG (chin), nasal and oral airflow, thoracic and abdominal wall motion, anterior tibialis EMG, snore microphone, electrocardiogram, and pulse oximetry. Continuous positive airway pressure (CPAP) was initiated at the beginning of the study and titrated to treat sleep-disordered breathing.   MEDICATIONS Medications taken by the patient : ATIVAN, REQUIP  Medications administered by patient during sleep study : No sleep medicine administered.   TECHNICIAN COMMENTS Comments added by technician: Patient had difficulty initiating sleep. Patient was restless all through the night.  Comments added by scorer: N/A   RESPIRATORY PARAMETERS Optimal PAP Pressure (cm): 9 AHI at Optimal Pressure (/hr): 18.4 Overall Minimal O2 (%): 89.00 Supine % at Optimal Pressure (%): 100 Minimal O2 at Optimal Pressure (%): 91.0     SLEEP ARCHITECTURE The study was initiated at 10:55:33 PM and ended at 5:07:43 AM. Sleep onset time was 34.1 minutes and the sleep efficiency was 37.5%. The total sleep time was 139.6 minutes. The patient spent 35.11% of the night in stage N1 sleep, 64.89% in stage N2 sleep, 0.00% in stage N3 and 0.00% in REM.Stage REM latency was N/A minutes Wake after sleep onset was 198.5. Alpha  intrusion was absent. Supine sleep was 29.43%.   CARDIAC DATA The 2 lead EKG demonstrated sinus rhythm. The mean heart rate was 62.84 beats per minute. Other EKG findings include: None.   LEG MOVEMENT DATA The total Periodic Limb Movements of Sleep (PLMS) were 803. The PLMS index was 345.20. A PLMS index of <15 is considered normal in adults.   IMPRESSIONS - The optimal PAP pressure was 9 cm of water. - Central sleep apnea was not noted during this titration (CAI = 0.0/h). - Mild oxygen desaturations were observed during this titration (min O2 = 89.00%). - The patient snored with Moderate snoring volume during this titration study. - No cardiac abnormalities were observed during this study. - Severe periodic limb movements were observed during this study. Arousals associated with PLMs were significant. - Difficulty maintaining sleep during sthi CPAP study   DIAGNOSIS - Obstructive Sleep Apnea (327.23 [G47.33 ICD-10])    RECOMMENDATIONS - Trial of CPAP therapy on 9 cm H2O with a Medium size Resmed Full Face Mask AirFit F20 mask and heated humidification. - Avoid alcohol, sedatives and other CNS depressants that may worsen sleep apnea and disrupt normal sleep architecture. - Sleep hygiene should be reviewed to assess factors that may improve sleep quality. - Weight management and regular exercise should be initiated or continued. - Return to [facility] for re-evaluation after 4 weeks of therapy    Kara Mead MD. FCCP. [facility]   09/24/2015

## 2015-09-25 ENCOUNTER — Other Ambulatory Visit (HOSPITAL_COMMUNITY): Payer: 59

## 2015-09-26 ENCOUNTER — Other Ambulatory Visit (HOSPITAL_COMMUNITY): Payer: 59

## 2015-09-26 NOTE — Telephone Encounter (Signed)
Left message for patient to call back  

## 2015-09-29 ENCOUNTER — Other Ambulatory Visit (HOSPITAL_COMMUNITY): Payer: 59

## 2015-09-29 ENCOUNTER — Other Ambulatory Visit: Payer: Self-pay | Admitting: Pulmonary Disease

## 2015-09-29 ENCOUNTER — Ambulatory Visit (INDEPENDENT_AMBULATORY_CARE_PROVIDER_SITE_OTHER): Payer: 59 | Admitting: Psychiatry

## 2015-09-29 DIAGNOSIS — F3130 Bipolar disorder, current episode depressed, mild or moderate severity, unspecified: Secondary | ICD-10-CM

## 2015-09-29 NOTE — Progress Notes (Signed)
   THERAPIST PROGRESS NOTE  Session Time: 2:15-3:10  Participation Level: Active   Behavioral Response: CasualAlertEuthymic  Type of Therapy: Individual Therapy   Treatment Goals addressed: Depression  Interventions: CBT, supportive  Summary: [name] is a 70 y.o. female who presents with anxiety and depression.   Suicidal/Homicidal: Nowithout intent/plan   Therapist Response:Pt. Presents with euthymic mood, talkative, well-groomed, engaged in therapeutic process. Pt. 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 Pt. Also reports that she is making progress in planning to have her apartment cleaned. Pt. Reports that she was able to identify a neighbor who can help her clean up her apartment. Pt. Processed her shame regarding the condition of her apartment and how she would be able to address this issue with her neighbor. Pt. Was receptive to several suggestions including being honest with her friend regarding her shame. Pt. Also discussed concerns about her significant other and how he contributes to the condition of her home. Pt. Discussed having a couple session and reviewing prior plan of assigning household chores.  Plan: Pt. To continue with CBT based therapy and return in approximately 2 weeks.  Nancie Neas, [facility] 09/29/2015

## 2015-09-30 ENCOUNTER — Other Ambulatory Visit (HOSPITAL_COMMUNITY): Payer: 59

## 2015-10-01 ENCOUNTER — Telehealth: Payer: Self-pay | Admitting: Adult Health

## 2015-10-01 ENCOUNTER — Other Ambulatory Visit: Payer: Self-pay | Admitting: Pulmonary Disease

## 2015-10-01 ENCOUNTER — Other Ambulatory Visit (HOSPITAL_COMMUNITY): Payer: 59

## 2015-10-01 MED ORDER — ROPINIROLE HCL 0.5 MG PO TABS: ORAL_TABLET | ORAL | Status: DC

## 2015-10-01 NOTE — Telephone Encounter (Signed)
ATC fast busy signal, [facility]

## 2015-10-01 NOTE — Telephone Encounter (Signed)
Patient aware of Dr. Bari Mantis recommendations. Rx for CPAP entered. Patient will call back once CPAP has been set up and schedule 6 week follow up. Nothing further needed.

## 2015-10-01 NOTE — Telephone Encounter (Signed)
Called and spoke with pt and she stated that she will come in and see TP on 7/18 at 11:30 and this appt was made for her and she is aware that she will need to keep this appt.  Pt voiced her understanding.  Refill for the requip has been sent to her pharmacy.

## 2015-10-02 ENCOUNTER — Other Ambulatory Visit (HOSPITAL_COMMUNITY): Payer: 59

## 2015-10-07 ENCOUNTER — Ambulatory Visit: Payer: Self-pay | Admitting: Adult Health

## 2015-10-07 ENCOUNTER — Ambulatory Visit (HOSPITAL_COMMUNITY): Payer: Self-pay | Admitting: Psychiatry

## 2015-10-13 ENCOUNTER — Encounter: Payer: Self-pay | Admitting: Cardiology

## 2015-10-21 ENCOUNTER — Ambulatory Visit (HOSPITAL_COMMUNITY): Payer: Self-pay | Admitting: Psychiatry

## 2015-10-29 ENCOUNTER — Telehealth: Payer: Self-pay | Admitting: Adult Health

## 2015-10-29 NOTE — Telephone Encounter (Signed)
lmtcb x1 

## 2015-10-30 ENCOUNTER — Encounter: Payer: Self-pay | Admitting: Cardiology

## 2015-10-30 DIAGNOSIS — R0989 Other specified symptoms and signs involving the circulatory and respiratory systems: Secondary | ICD-10-CM

## 2015-10-30 NOTE — Progress Notes (Signed)
     ROS All other systems reviewed and are negative.   This encounter was created in error - please disregard.

## 2015-10-30 NOTE — Telephone Encounter (Signed)
lmomtcb x 2 for the pt.  

## 2015-10-31 NOTE — Telephone Encounter (Signed)
Spoke with pt. She heard from [facility] yesterday. They will delivering her CPAP in a few days. Nothing further was needed.

## 2015-11-03 ENCOUNTER — Ambulatory Visit (INDEPENDENT_AMBULATORY_CARE_PROVIDER_SITE_OTHER): Payer: 59 | Admitting: Psychiatry

## 2015-11-03 DIAGNOSIS — F3130 Bipolar disorder, current episode depressed, mild or moderate severity, unspecified: Secondary | ICD-10-CM

## 2015-11-03 NOTE — Progress Notes (Signed)
   THERAPIST PROGRESS NOTE  Session Time: 2:05-3:10  Participation Level: Active   Behavioral Response: CasualAlertEuthymic  Type of Therapy: Individual Therapy   Treatment Goals addressed: Depression  Interventions: CBT, supportive  Summary: [name] is a 70 y.o. female who presents with anxiety and depression.   Suicidal/Homicidal: Nowithout intent/plan   Therapist Response:Pt. Presents as talkative, well-groomed, engaged in therapeutic process. Pt. Reports sleepiness and lethargy that she attributes to sleeplessness due to interrupted sleep due to sleep apnea. Pt. Reports that she has completed recent sleep study and expects to get new CPAP machine this week. Pt. Also reports that her sleep has been interrupted due to chronic hip pain and has been seeing a chiropractor who has not been helpful. Pt. Was referred to chiropractor that specializes in pain management. Significant time in session was spent on recent conversation with younger sister. Pt. Reports that she spent "much emotional energy" reviewing events of her childhood with expectations that her sister would validate her experiences of her mother and father. When Pt.'s experiences were not validated, Pt. Concluded that her sister did not love or respect her and does not desire to continue with the relationship. Socratic questioning was used to process Pt.'s conclusions about her sister's care for her. Pt. Agrees that her sister may have had a different view of their parents, but that she needs validation of her experience in order to have a relationship with her sister. Pt. Also provided update of current relationship with her fiance and suggestion that she complete medical consent and health care power of attorney so that she can discuss concerns about his health with his healthcare providers.  Plan: Pt. To continue with CBT based therapy and return in approximately 2 weeks.    Nancie Neas,  [facility] 11/03/2015

## 2015-11-05 ENCOUNTER — Encounter: Payer: Self-pay | Admitting: Cardiology

## 2015-11-06 ENCOUNTER — Ambulatory Visit (HOSPITAL_COMMUNITY): Payer: Self-pay | Admitting: Psychiatry

## 2015-11-06 ENCOUNTER — Other Ambulatory Visit (HOSPITAL_COMMUNITY): Payer: Self-pay | Admitting: Psychiatry

## 2015-11-06 DIAGNOSIS — F3130 Bipolar disorder, current episode depressed, mild or moderate severity, unspecified: Secondary | ICD-10-CM

## 2015-11-11 ENCOUNTER — Ambulatory Visit (INDEPENDENT_AMBULATORY_CARE_PROVIDER_SITE_OTHER): Payer: 59 | Admitting: Psychiatry

## 2015-11-11 DIAGNOSIS — F3130 Bipolar disorder, current episode depressed, mild or moderate severity, unspecified: Secondary | ICD-10-CM | POA: Diagnosis not present

## 2015-11-11 NOTE — Progress Notes (Signed)
Session Time: 2:05-3:00  Participation Level:Active   Behavioral Response:CasualAlertDepressed  Type of Therapy: Individual Therapy   Treatment Goals addressed: Depression  Interventions:CBT, supportive  Summary: [name] is a 70 y.o. female who presents with anxiety and depression.   Suicidal/Homicidal:Nowithout intent/plan   Therapist Response:Pt. Presents as depressed, reports disorganization and low frustration tolerance since last session. Pt. Reports that she allowed her prescription for lamictal to lapse and has not taken for the last four days. Pt. Attributes current episode of depression to not having her medication. Dr. Lovena Le was consulted regarding restarting lamictal and that it was ok for patient to restart as prescribed because only four days since last used. Pt. Discussed ongoing stressor of her being caregiver for her significant other and overwhelmed by household tasks and concerns about memory loss. Pt. Was encouraged to restart on her medication as soon as possible and to continue focusing on self-care. Pt. Discussed her desire to start exercise program especially swimming to address body aches and pain in her hips.  Plan:Pt. To continue with CBT based therapy and return in approximately 2 weeks.

## 2015-11-25 ENCOUNTER — Ambulatory Visit (INDEPENDENT_AMBULATORY_CARE_PROVIDER_SITE_OTHER): Payer: 59 | Admitting: Psychiatry

## 2015-11-25 DIAGNOSIS — F3130 Bipolar disorder, current episode depressed, mild or moderate severity, unspecified: Secondary | ICD-10-CM

## 2015-11-27 NOTE — Progress Notes (Signed)
   THERAPIST PROGRESS NOTE  Session Time: 2:05-3:00  Participation Level:Active   Behavioral Response:CasualAlertDepressed  Type of Therapy: Individual Therapy   Treatment Goals addressed: Depression  Interventions:CBT, supportive  Summary: MAKYA GRILLIOT is a 70 y.o. female who presents with anxiety and depression.   Suicidal/Homicidal:Nowithout intent/plan   Therapist Response:Pt. Presents as mildly depressed, improved mood compared to last session. Pt. Discussed sadness regarding attachment to her lifestyle as a child and younger person, and not feeling as good about herself financially. Pt. Discussed social isolation that has been created with dependence on relationship with fiance. Session focused on history of seasonal affective disorder and social isolation and making a plan for social events with friends and neighbors over the next few months.   Plan:Pt. To continue with CBT based therapy and return in approximately 2 weeks.    Nancie Neas, [facility] 11/27/2015

## 2015-12-04 ENCOUNTER — Ambulatory Visit (INDEPENDENT_AMBULATORY_CARE_PROVIDER_SITE_OTHER): Payer: 59 | Admitting: Psychiatry

## 2015-12-04 DIAGNOSIS — F3130 Bipolar disorder, current episode depressed, mild or moderate severity, unspecified: Secondary | ICD-10-CM

## 2015-12-04 DIAGNOSIS — G4733 Obstructive sleep apnea (adult) (pediatric): Secondary | ICD-10-CM | POA: Diagnosis not present

## 2015-12-04 DIAGNOSIS — G2581 Restless legs syndrome: Secondary | ICD-10-CM | POA: Diagnosis not present

## 2015-12-04 NOTE — Progress Notes (Signed)
   THERAPIST PROGRESS NOTE  Session Time: 2:30-3:05  Participation Level:Active   Behavioral Response:CasualAlertDepressed  Type of Therapy: Individual Therapy   Treatment Goals addressed: Depression  Interventions:CBT, supportive  Summary: [name] is a 70 y.o. female who presents with anxiety and depression.   Suicidal/Homicidal:Nowithout intent/plan   Therapist Response:Pt. Presents as mildly depressed, talkative. Pt. Reports that she has felt better over the last few weeks. Pt.'s mood seems to be dependent on the mood of her boyfriend and difficulty of caregiving. Session focused on recognizing patterns of codependence and pt.'s commitment to her self-care despite her boyfriend's condition. Pt. Discussed use of creativity for her self-care. Pt. No longer has interest in beads, normalized change in interest and encouraged newer interests in paper crafts and [place]. Counselor checked in about planning for SAD and changes experienced with change in seasons.   Plan:Pt. To continue with CBT based therapy and return in approximately 2 weeks.    Nancie Neas, [facility] 12/04/2015

## 2015-12-05 ENCOUNTER — Telehealth: Payer: Self-pay | Admitting: Pulmonary Disease

## 2015-12-05 NOTE — Telephone Encounter (Signed)
Rodena Piety had CMN for patient needing CPAP supplies; per note from RA-pt needs OV before he will sign form. Pt will be seen Monday 12-08-15 at 3:00pm. Rodena Piety will make sure forms get to [facility] Monday for RA to sign. Nothing more needed at this time.

## 2015-12-08 ENCOUNTER — Encounter: Payer: Self-pay | Admitting: Pulmonary Disease

## 2015-12-08 ENCOUNTER — Ambulatory Visit (INDEPENDENT_AMBULATORY_CARE_PROVIDER_SITE_OTHER): Payer: Medicare Other | Admitting: Pulmonary Disease

## 2015-12-08 DIAGNOSIS — G2581 Restless legs syndrome: Secondary | ICD-10-CM

## 2015-12-08 DIAGNOSIS — G4733 Obstructive sleep apnea (adult) (pediatric): Secondary | ICD-10-CM

## 2015-12-08 NOTE — Progress Notes (Signed)
   Subjective:    Patient ID: [name], female    DOB: 1946/01/15, 70 y.o.   MRN: [id number]  HPI  70 year old female previous patient of Dr. Gwenette Greet followed for restless leg syndrome and sleep apnea\   12/08/2015  Chief Complaint  Patient presents with  . Sleep Apnea    Patient needs face to face visit in order to pick up her CPAP machine.  Doing well, no concerns.   She has been diagnosed with bipolar disorder and anxiety. Due to multiple medications for these, she has gained weight. Due to this she reports increased snoring and daytime somnolence. Epworth sleepiness score is 10 Reviewed sleep study and titration study, events were corrected by 9 cm with full face mask, PLM were few during Baseline study and not associated with arousals These seem to increase during titration study  She reports as creepy crawly sensations in her legs that start early evening and extending to bedtime. These are partially relieved by Requip. She reports being on Prozac for at least 2 years and onset of restless legs was around the same time  She was given prescription for CPAP but DME would not set up until total face-to-face visit performed  TEST  NPSG 2004:  AHI 10/hr PSG 07/2015 (214 lbs)  AHI 23/h, mild PLMs 23/h but no sig arousals  CPAP titration 09/2015 9 cm      Review of Systems neg for any significant sore throat, dysphagia, itching, sneezing, nasal congestion or excess/ purulent secretions, fever, chills, sweats, unintended wt loss, pleuritic or exertional cp, hempoptysis, orthopnea pnd or change in chronic leg swelling. Also denies presyncope, palpitations, heartburn, abdominal pain, nausea, vomiting, diarrhea or change in bowel or urinary habits, dysuria,hematuria, rash, arthralgias, visual complaints, headache, numbness weakness or ataxia.     Objective:   Physical Exam  Gen. Pleasant, well-nourished, in no distress ENT - no lesions, no post nasal drip Neck: No JVD,  no thyromegaly, no carotid bruits Lungs: no use of accessory muscles, no dullness to percussion, clear without rales or rhonchi  Cardiovascular: Rhythm regular, heart sounds  normal, no murmurs or gallops, no peripheral edema Musculoskeletal: No deformities, no cyanosis or clubbing        Assessment & Plan:

## 2015-12-08 NOTE — Assessment & Plan Note (Signed)
Prescription for CPAP 9 cm with full face mask sent to DME Download to be reviewed in 4 weeks  Weight loss encouraged, compliance with goal of at least 4-6 hrs every night is the expectation. Advised against medications with sedative side effects Cautioned against driving when sleepy - understanding that sleepiness will vary on a day to day basis

## 2015-12-08 NOTE — Patient Instructions (Signed)
Prescription for CPAP will be sent to DME 

## 2015-12-08 NOTE — Assessment & Plan Note (Signed)
Stay on Requip for now. Would be interesting to see if cpap decreases her symptoms. PLM seem to increase during the titration study.  Wonder if her psychiatric medications are making PLMs worse

## 2015-12-16 ENCOUNTER — Other Ambulatory Visit (HOSPITAL_COMMUNITY): Payer: Self-pay | Admitting: Surgery

## 2015-12-16 DIAGNOSIS — R1011 Right upper quadrant pain: Secondary | ICD-10-CM

## 2015-12-17 ENCOUNTER — Other Ambulatory Visit (HOSPITAL_COMMUNITY): Payer: Self-pay | Admitting: Psychiatry

## 2015-12-17 ENCOUNTER — Other Ambulatory Visit: Payer: Self-pay | Admitting: Pulmonary Disease

## 2015-12-17 ENCOUNTER — Telehealth (HOSPITAL_COMMUNITY): Payer: Self-pay

## 2015-12-17 ENCOUNTER — Other Ambulatory Visit: Payer: Self-pay | Admitting: *Deleted

## 2015-12-17 DIAGNOSIS — F3130 Bipolar disorder, current episode depressed, mild or moderate severity, unspecified: Secondary | ICD-10-CM

## 2015-12-17 MED ORDER — NEBIVOLOL HCL 2.5 MG PO TABS: ORAL_TABLET | ORAL | 0 refills | Status: DC

## 2015-12-17 NOTE — Telephone Encounter (Signed)
OK 

## 2015-12-17 NOTE — Telephone Encounter (Signed)
Pt is requesting refill on requip. Pt was seen on 12/08/15. Please advise Dr. Elsworth Soho thanks

## 2015-12-18 ENCOUNTER — Other Ambulatory Visit: Payer: Self-pay | Admitting: Pulmonary Disease

## 2015-12-18 ENCOUNTER — Other Ambulatory Visit (HOSPITAL_COMMUNITY): Payer: Self-pay | Admitting: Psychiatry

## 2015-12-18 ENCOUNTER — Ambulatory Visit (HOSPITAL_COMMUNITY): Payer: Self-pay | Admitting: Psychiatry

## 2015-12-18 DIAGNOSIS — F3130 Bipolar disorder, current episode depressed, mild or moderate severity, unspecified: Secondary | ICD-10-CM

## 2015-12-18 MED ORDER — BUPROPION HCL ER (XL) 300 MG PO TB24: ORAL_TABLET | ORAL | 1 refills | Status: DC

## 2015-12-18 NOTE — Telephone Encounter (Signed)
lmtcb x1 

## 2015-12-19 ENCOUNTER — Telehealth: Payer: Self-pay | Admitting: Pulmonary Disease

## 2015-12-19 MED ORDER — ROPINIROLE HCL 0.5 MG PO TABS: ORAL_TABLET | ORAL | 1 refills | Status: DC

## 2015-12-19 NOTE — Telephone Encounter (Signed)
Rx has been sent in. Pt aware.

## 2015-12-19 NOTE — Telephone Encounter (Signed)
See prior phone note. Duplicate message

## 2015-12-19 NOTE — Telephone Encounter (Signed)
LVM for pt to return.

## 2015-12-22 ENCOUNTER — Other Ambulatory Visit (HOSPITAL_COMMUNITY): Payer: Self-pay

## 2015-12-22 DIAGNOSIS — F3130 Bipolar disorder, current episode depressed, mild or moderate severity, unspecified: Secondary | ICD-10-CM

## 2015-12-22 MED ORDER — FLUOXETINE HCL 40 MG PO CAPS: 40.0000 mg | ORAL_CAPSULE | Freq: Every day | ORAL | 0 refills | Status: DC

## 2015-12-25 ENCOUNTER — Encounter (HOSPITAL_COMMUNITY): Admission: RE | Admit: 2015-12-25 | Payer: Medicare Other | Source: Ambulatory Visit

## 2015-12-29 ENCOUNTER — Inpatient Hospital Stay: Admission: RE | Admit: 2015-12-29 | Payer: Self-pay | Source: Ambulatory Visit

## 2016-01-01 ENCOUNTER — Ambulatory Visit (INDEPENDENT_AMBULATORY_CARE_PROVIDER_SITE_OTHER): Payer: 59 | Admitting: Psychiatry

## 2016-01-01 DIAGNOSIS — F3130 Bipolar disorder, current episode depressed, mild or moderate severity, unspecified: Secondary | ICD-10-CM

## 2016-01-02 ENCOUNTER — Other Ambulatory Visit: Payer: Self-pay

## 2016-01-02 NOTE — Progress Notes (Signed)
   THERAPIST PROGRESS NOTE  Session Time: 2:30-3:05  Participation Level:Active   Behavioral Response:CasualAlertDepressed  Type of Therapy: Individual Therapy   Treatment Goals addressed: Depression  Interventions: solution-focused, supportive  Summary: [name] is a 70 y.o. female who presents with anxiety and depression.   Suicidal/Homicidal:Nowithout intent/plan   Therapist Response:Pt. Continues to present as mildly depressive, overwhelmed by stress of disorganization and caregiving. Pt. Reported that she continues to be challenged to set healthy space boundaries with her boyfriend because sometimes she enjoys him and she is not able to tell him to leave until he has triggered her. Pt. Was encouraged to do a monthly calendar with him and plan each week which 3-4 days that she will stay in her apartment. Pt. Reports that she feels that she can do this and was offered a couples session to facilitate a joint discussion about boundaries. Pt. Reported that her home is messy and disorganized and she feels overwhelmed by  It. Pt. Was given referral to a cleaning service who will help her without a contract or minimum number of services. Pt. Reported that she would follow up on the referral for the next session. Pt. Also discussed desire to meet people and do volunteer work. Pt. Looked at volunteer opportunities with [employer] and discussed how this opportunity was consistent with past interests and values.  Plan:Pt. To continue with CBT based therapy and return in approximately 2 weeks.   Diagnosis: Axis I: major depressive disorder, moderate, recurrent    Axis II: none    Nancie Neas, [facility] 01/02/2016

## 2016-01-05 ENCOUNTER — Encounter (HOSPITAL_COMMUNITY): Admission: RE | Admit: 2016-01-05 | Payer: Medicare Other | Source: Ambulatory Visit

## 2016-01-09 ENCOUNTER — Ambulatory Visit
Admission: RE | Admit: 2016-01-09 | Discharge: 2016-01-09 | Disposition: A | Discharge status: Home / Self Care | Payer: Medicare Other | Source: Ambulatory Visit | Attending: Surgery | Admitting: Surgery

## 2016-01-09 DIAGNOSIS — R1011 Right upper quadrant pain: Secondary | ICD-10-CM

## 2016-01-09 MED ORDER — IOHEXOL 300 MG/ML  SOLN
30.0000 mL | Freq: Once | INTRAMUSCULAR | Status: AC | PRN
  Administered 2016-01-09: 30 mL via ORAL

## 2016-01-09 MED ORDER — IOPAMIDOL (ISOVUE-300) INJECTION 61%
125.0000 mL | Freq: Once | INTRAVENOUS | Status: AC | PRN
  Administered 2016-01-09: 125 mL via INTRAVENOUS

## 2016-01-12 NOTE — Progress Notes (Signed)
Cardiology Office Note    Date:  01/13/2016   ID:  [name], DOB 05-23-1945, MRN [id number]  PCP:  Brooke Heck, MD  Cardiologist:  Fransico Him, MD   Chief Complaint  Patient presents with  . Follow-up    atrial tachycardia, HTN and PVCs    History of Present Illness:  [name] is a 70 y.o. female with a history of PVC's, diastolic dysfunction, PAT s/p ablation and HTN who presents today for followup. She is doing well.   She denies any  LE edema,  palpitations, dizziness or syncope. She has had some problems recently with SOB when walking upstairs or going on a walk outside.  She will break out in a sweat and sometimes feels some pressure on her chest.     Past Medical History:  Diagnosis Date  . Abscess of Bartholin's gland   . Acute vestibular neuronitis    Dr [name]  . ADHD (attention deficit hyperactivity disorder)   . Adverse effect of general anesthetic    felt paralyzed while receiving anesthesia  . Anxiety   . Bipolar 1 disorder ([name])   . Chronic kidney disease    kidney cancer- pt states she has elected to not have it treated.  . Depression    Bipolar disorder/goes to [facility] for meds  . Difficult intubation    told by [facility] that she was hard to intubate 15b yrs ago in [place]- surgery since then no problems  . Dysrhythmia    h/o atrial tachycardia- s/p ablation   . GERD (gastroesophageal reflux disease)   . H/O echocardiogram 07/2012   [place] LVF w grade I siastolic dysfunction   . History of attention deficit disorder   . History of endometriosis   . History of pleural effusion   . Hypertension   . 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 Migraines   . Obesity   . OSA (obstructive sleep apnea)    uses oral appliance instead of CPAP  . PONV (postoperative nausea and vomiting)   . PTSD (post-traumatic stress disorder)   . PVC's (premature ventricular contractions)   . Renal mass  02/15/2012  . RLS (restless legs syndrome)   . RLS (restless legs syndrome)    Dr Gwenette Greet  . rt renal ca dx'd 12/2009   no treatment/ no surg  . Vertigo     Past Surgical History:  Procedure Laterality Date  . CARDIAC ELECTROPHYSIOLOGY [place] AND ABLATION  2000s  . nasoseptal reconstruction  1990s  . THYROIDECTOMY  1990  . TONSILLECTOMY     as a child  . TUBAL LIGATION  1970s  . uterine mass removal  03/2012   was found to be benign    Current Medications: Outpatient Medications Prior to Visit  Medication Sig Dispense Refill  . aspirin 81 MG EC tablet Take 1 tablet (81 mg total) by mouth daily. For heart health 30 tablet 1  . buPROPion (WELLBUTRIN XL) 300 MG 24 hr tablet TAKE 1 TABLET (300 MG TOTAL) BY MOUTH DAILY. FOR DEPRESSION 30 tablet 1  . FLUoxetine (PROZAC) 40 MG capsule Take 1 capsule (40 mg total) by mouth daily. 90 capsule 0  . fluticasone (FLONASE) 50 MCG/ACT nasal spray Place 1 spray into both nostrils as needed. Sinus problems  2  . lamoTRIgine (LAMICTAL) 200 MG tablet TAKE 1 TABLET BY MOUTH EVERY DAY 90 tablet 0  . levothyroxine (SYNTHROID, LEVOTHROID) 88 MCG tablet Take 1 tablet (  88 mcg total) by mouth daily before breakfast. For low thyroid function (Patient taking differently: Take 100 mcg by mouth daily before breakfast. For low thyroid function)    . LORazepam (ATIVAN) 1 MG tablet Take 1 tablet (1 mg total) by mouth 2 (two) times daily as needed for anxiety. 180 tablet 0  . nebivolol (BYSTOLIC) 2.5 MG tablet TAKE 1 TABLET (2.5 MG TOTAL) BY MOUTH DAILY. **Please keep 01/13/16 appointment for further refills** 30 tablet 0  . rOPINIRole (REQUIP) 0.5 MG tablet Take 1 tablet po in the afternoon and 1 tablet po qhs 90 tablet 1  . valsartan-hydrochlorothiazide (DIOVAN-HCT) 320-25 MG per tablet Take 1 tablet by mouth daily. High blood pressure control 30 tablet 0  . esomeprazole (NEXIUM) 40 MG capsule Take 40 mg by mouth as needed. GERD  3   No facility-administered  medications prior to visit.      Allergies:   Abilify [aripiprazole]; Compazine [prochlorperazine edisylate]; Diflucan [fluconazole]; Geodon [ziprasidone hydrochloride]; Lithium; Talwin [pentazocine]; and Toradol [ketorolac tromethamine]   Social History   Social History  . Marital status: Single    Spouse name: N/A  . Number of children: N/A  . Years of education: N/A   Occupational History  . 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 Smoking status: Never Smoker  . Smokeless tobacco: Never Used  . Alcohol use No  . Drug use: No  . Sexual activity: Yes    Birth control/ protection: None   Other Topics Concern  . None   Social History Narrative   Divorced and lives alone   Has children     Family History:  The patient's family history includes Alcohol abuse in her father; Allergies in her brother, daughter, father, and sister; Bipolar disorder in her father; CAD in her mother; Cancer in her paternal grandfather; Colon cancer in her paternal grandmother; Depression in her mother; Heart disease in her maternal grandfather and mother; Hypertension in her mother; OCD in her paternal grandmother; Skin cancer in her father.   ROS:   Please see the history of present illness.    ROS All other systems reviewed and are negative.  No flowsheet data found.     PHYSICAL EXAM:   VS:  BP 122/76   Pulse 66   Ht 5' 4.75" (1.645 m)   Wt 209 lb 12.8 oz (95.2 kg)   BMI 35.18 kg/m    GEN: Well nourished, well developed, in no acute distress  HEENT: normal  Neck: no JVD, carotid bruits, or masses Cardiac: RRR; no murmurs, rubs, or gallops,no edema.  Intact distal pulses bilaterally.  Respiratory:  clear to auscultation bilaterally, normal work of breathing GI: soft, nontender, nondistended, + BS MS: no deformity or atrophy  Skin: warm and dry, no rash Neuro:  Alert and Oriented x 3, Strength and sensation are intact Psych: euthymic mood, full  affect  Wt Readings from Last 3 Encounters:  01/13/16 209 lb 12.8 oz (95.2 kg)  12/08/15 214 lb 9.6 oz (97.3 kg)  09/21/15 212 lb (96.2 kg)      Studies/Labs Reviewed:   EKG:  EKG is ordered today.  The ekg ordered today demonstrates NSR with 66bpm with no ST changes  Recent Labs: 02/18/2015: ALT 16; BUN 19.1; Creatinine 0.9; HGB 13.6; Platelets 263; Potassium 4.2; Sodium 139   Lipid Panel    Component Value Date/Time   CHOL 167 04/21/2013 0530   TRIG 106 04/21/2013 0530   HDL  44 04/21/2013 0530   CHOLHDL 3.8 04/21/2013 0530   VLDL 21 04/21/2013 0530   LDLCALC 102 (H) 04/21/2013 0530    Additional studies/ records that were reviewed today include:  none    ASSESSMENT:    1. PAT (paroxysmal atrial tachycardia) ([facility])   2. Essential hypertension, benign   3. PVC's (premature ventricular contractions)   4. SOB (shortness of breath)      PLAN:  In order of problems listed above:  1. Paroxysmal atrial tachycardia - with no reoccurence.   Continue BB.   2. HTN - BP controlled on current meds.  Continue BB/ARB and diuretic.   3. PVC's controlled on BB. 4. SOB ? Etiology.  I will get a 2D echo to assess LVF.  She had a stress teat a year ago for similar symptoms which showed no ischemia.  Since her symptoms have worsened I will get a coronary CTA with morphology to assess for CAD.  I suspect her SOB is due to deconditioning and obesity.     Medication Adjustments/Labs and Tests Ordered: Current medicines are reviewed at length with the patient today.  Concerns regarding medicines are outlined above.  Medication changes, Labs and Tests ordered today are listed in the Patient Instructions below.  There are no Patient Instructions on file for this visit.   Signed, Fransico Him, MD  01/13/2016 2:24 PM    [address], [place], [place]  [address] Phone: [phone]; Fax: [phone]

## 2016-01-13 ENCOUNTER — Ambulatory Visit (INDEPENDENT_AMBULATORY_CARE_PROVIDER_SITE_OTHER): Payer: Medicare Other | Admitting: Cardiology

## 2016-01-13 ENCOUNTER — Encounter: Payer: Self-pay | Admitting: Cardiology

## 2016-01-13 VITALS — BP 122/76 | HR 66 | Ht 64.75 in | Wt 209.8 lb

## 2016-01-13 DIAGNOSIS — I471 Supraventricular tachycardia: Secondary | ICD-10-CM

## 2016-01-13 DIAGNOSIS — I493 Ventricular premature depolarization: Secondary | ICD-10-CM | POA: Diagnosis not present

## 2016-01-13 DIAGNOSIS — R079 Chest pain, unspecified: Secondary | ICD-10-CM

## 2016-01-13 DIAGNOSIS — I10 Essential (primary) hypertension: Secondary | ICD-10-CM

## 2016-01-13 DIAGNOSIS — R0602 Shortness of breath: Secondary | ICD-10-CM

## 2016-01-13 NOTE — Patient Instructions (Signed)
Medication Instructions:  Your physician recommends that you continue on your current medications as directed. Please refer to the Current Medication list given to you today.   Labwork: TODAY: BMET  Testing/Procedures: Your physician has requested that you have an echocardiogram. Echocardiography is a painless test that uses sound waves to create images of your heart. It provides your doctor with information about the size and shape of your heart and how well your heart's chambers and valves are working. This procedure takes approximately one hour. There are no restrictions for this procedure.   Dr. Radford Pax recommends you have a CARDIAC CT.  Follow-Up: Your physician wants you to follow-up in: 1 year with Dr. Radford Pax. You will receive a reminder letter in the mail two months in advance. If you don't receive a letter, please call our office to schedule the follow-up appointment.   Any Other Special Instructions Will Be Listed Below (If Applicable).     If you need a refill on your cardiac medications before your next appointment, please call your pharmacy.

## 2016-01-14 ENCOUNTER — Ambulatory Visit (HOSPITAL_COMMUNITY): Admission: RE | Admit: 2016-01-14 | Payer: Medicare Other | Source: Ambulatory Visit

## 2016-01-15 ENCOUNTER — Other Ambulatory Visit: Payer: Self-pay | Admitting: Cardiology

## 2016-01-15 ENCOUNTER — Ambulatory Visit (INDEPENDENT_AMBULATORY_CARE_PROVIDER_SITE_OTHER): Payer: 59 | Admitting: Psychiatry

## 2016-01-15 DIAGNOSIS — F331 Major depressive disorder, recurrent, moderate: Secondary | ICD-10-CM

## 2016-01-15 DIAGNOSIS — F3130 Bipolar disorder, current episode depressed, mild or moderate severity, unspecified: Secondary | ICD-10-CM

## 2016-01-16 ENCOUNTER — Ambulatory Visit: Payer: Self-pay | Admitting: Surgery

## 2016-01-16 NOTE — H&P (Signed)
[name] 01/16/2016 1:18 PM Location: [facility] Patient #: [id number] DOB: [birth year] Divorced / Language: [name] / Race: Refused to Report/Unreported Female  History of Present Illness ([name] A. Kae Heller MD; 01/16/2016 2:58 PM) Patient words: [name] returns to discuss the results of the tests we ordered last time. Since I saw her last month she says her symptoms have actually improved. She has been eating better and had lost 8lb, but old eating habits returned and she is back up 4lb. She has been seen in cardiology clinic this week where she noted some worsening shortness of breath with exertion and associated pressure on her chest, and she has an echo and coronary CTA pending. She also notes several stressors on her mind- her RCC, upcoming marriage and problems in their relationship/issues with personal space. She is unsure today about pursuing surgery.  To recall, this is a very nice 70 year old woman with multiple comorbidities who presents with a one-month history of abdominal discomfort, which most recently is in bilateral subcostal margins, associated with nausea and nonbilious nonbloody emesis, decreased appetite, intermittent diarrhea and constipation. She states that she had an x-ray about 5 years ago which showed some problems with her gallbladder. She denies fevers chills or unintentional weight loss but reports fatigue which is a chronic issue for her. She does not have any records in our system or in Epic of recent workup. Of note she has a history of renal cell carcinoma which she states was diagnosed in 2011 and for which she declined treatment, although she was offered surgery. She has never had a screening colonoscopy, and does have a family history of colon cancer. She also has a history of bipolar disorder, posttraumatic stress disorder, obstructive sleep apnea, depression, endometriosis status post diagnostic laparoscopy 3 (now resolved  post-menopause), hypothyroidism, hypertension, and SVT status post ablation.  She has had an [place] of her gb which confirms several stones up to 1.5cm in size, mildly distended GB, fatty liver and CBD 63mm. She underwent CT abdomen/pelvis showing the same, with the addition of slight increase size of right RCC to 6.1x5.2x4.8cm (was 5.7x5.5x4.5 in 2014). a couple liver and spleen cysts, uterine fibroids.  She had a CBC, LFT, BMP and these were unremarkable.  Recent EKG- NSR.  The patient is a 70 year old female.   Allergies Benjiman Core, CMA; 01/16/2016 2:26 PM) Lithium *ANTIPSYCHOTICS/ANTIMANIC AGENTS* Talwin Compound *ANALGESICS - OPIOID* Toradol *ANALGESICS - ANTI-INFLAMMATORY* Geodon *ANTIPSYCHOTICS/ANTIMANIC AGENTS* Abilify *ANTIPSYCHOTICS/ANTIMANIC AGENTS* Compazine *ANTIPSYCHOTICS/ANTIMANIC AGENTS* Diflucan *ANTIFUNGALS*  Medication History ([name], CMA; 01/16/2016 2:28 PM) Aspirin (81MG  Tablet, Oral) Active. LORazepam (1MG  Tablet, Oral) Active. Medications Reconciled Amoxicillin (875MG  Tablet, Oral) Active. BuPROPion HCl ER (XL) (300MG  Tablet ER 24HR, Oral) Active. Bystolic (2.5MG  Tablet, Oral) Active. FLUoxetine HCl (40MG  Capsule, Oral) Active. Fluticasone Propionate (50MCG/ACT Suspension, Nasal) Active. LamoTRIgine (200MG  Tablet, Oral) Active. Levothyroxine Sodium (88MCG Tablet, Oral) Active. ROPINIRole HCl (0.5MG  Tablet, Oral) Active. Valsartan-Hydrochlorothiazide (320-25MG  Tablet, Oral) Active.     Review of Systems ([name] A. Kae Heller MD; 01/16/2016 2:58 PM) All other systems negative  Vitals ([name] CMA; 01/16/2016 2:26 PM) 01/16/2016 2:25 PM Weight: 212 lb Height: 65in Body Surface Area: 2.03 m Body Mass Index: 35.28 kg/m  Temp.: 98.33F  Pulse: 83 (Regular)  P.OX: 98% (Room air) BP: 110/72 (Sitting, Left Arm, Standard)      Physical Exam ([name] A. Kae Heller MD; 01/16/2016 3:01 PM)  The physical exam findings are  as follows: Note:She is alert oriented and in no acute distress Pupils  equally round and reactive, extraocular motion intact, anicteric; moist mucous membranes Neck without masses or thyromegaly Unlabored respirations, clear to auscultation bilaterally, regular rate and rhythm, palpable radial pulses Abdomen is obese, no visible scars, no palpable hernia, organomegaly or mass. Nontender. Extremities are warm and well-perfused no edema or cyanosis, no deformity Neuro grossly intact, normal gait, sensation intact Psych normal mood and affect, appropriate insight to ongoing issues Skin- no visible or palpable lesions on limited skin exam    Assessment & Plan ([name] A. Kae Heller MD; 01/16/2016 3:09 PM)  ABDOMINAL PAIN (Principal Diagnosis) (R10.9) Story: Given her improvement with change in eating habits and results of this workup, I recommended that it is reasonable to proceed with cholecystectomy as it is more likely that biliary colic is in fact contributing to her upper abdominal pain, nausea and emesis. We discussed again the nature of laparoscopic cholecystectomy, risks, benefits, alternatives and we gave her a lap chole pamphlet. She brought up her own fears of somatization, and I reassured her that the cholelithiasis and maybe her R RCC are good reasons to have pain and such symptoms. We also discussed the possibility of ongoing symptoms despite cholecystectomy, in which case a different pathway for workup will need to be pursued. She states she is 80% sure she will proceed with surgery, she will call us to let us know if/when she is ready to schedule. She will need to go through with the echo/coronary CTA ordered by cards prior and will need cards clearance.

## 2016-01-19 ENCOUNTER — Encounter (HOSPITAL_COMMUNITY): Payer: Medicare Other

## 2016-01-19 ENCOUNTER — Encounter: Payer: Self-pay | Admitting: Cardiology

## 2016-01-20 ENCOUNTER — Ambulatory Visit: Payer: Self-pay | Admitting: Adult Health

## 2016-01-20 NOTE — Progress Notes (Signed)
   THERAPIST PROGRESS NOTE  Session Time: 1:10-2:00  Participation Level:Active   Behavioral Response:CasualAlertMildlyDepressed  Type of Therapy: Individual Therapy   Treatment Goals addressed: Depression  Interventions: solution-focused, supportive  Summary: [name] is a 70 y.o. female who presents with anxiety and depression.   Suicidal/Homicidal:Nowithout intent/plan   Therapist Response:Pt. Continues to present as mildly depressive. Pt. Reports that she doing better compared to last session. Pt. Reports that she has made progress in setting boundaries with her boyfriend and is more comfortable with asking him to leave when she feels overwhelmed. Pt. Also recognized that she is usually impacted by seasonal affective disorder at this time of the year, but this year she has not felt SAD symptoms and is doing much better. Session focused on wellness planning for the next few months. Pt. Discussed commitment to grocery shop ever other day so that she will have fresh food and not have problems with disorganization and spoilage. Pt. Also discussed efforts that she is making to connect with friends that she has lost touch with and acknowledging need for socialization outside of the relationship with her boyfriend.  Plan:Pt. To continue with CBT based therapy and return in approximately 2 weeks.   Diagnosis:      Axis I: major depressive disorder, moderate, recurrent                          Axis II: none    Nancie Neas, [facility] 01/20/2016

## 2016-01-22 ENCOUNTER — Ambulatory Visit (HOSPITAL_COMMUNITY): Payer: Self-pay | Admitting: Psychiatry

## 2016-02-03 ENCOUNTER — Telehealth: Payer: Self-pay | Admitting: Pulmonary Disease

## 2016-02-03 ENCOUNTER — Ambulatory Visit (INDEPENDENT_AMBULATORY_CARE_PROVIDER_SITE_OTHER): Payer: Medicare Other | Admitting: Adult Health

## 2016-02-03 ENCOUNTER — Ambulatory Visit (HOSPITAL_COMMUNITY): Payer: Medicare Other

## 2016-02-03 ENCOUNTER — Other Ambulatory Visit (HOSPITAL_COMMUNITY): Payer: Self-pay | Admitting: Psychiatry

## 2016-02-03 ENCOUNTER — Encounter: Payer: Self-pay | Admitting: Adult Health

## 2016-02-03 DIAGNOSIS — G4733 Obstructive sleep apnea (adult) (pediatric): Secondary | ICD-10-CM | POA: Diagnosis not present

## 2016-02-03 DIAGNOSIS — G2581 Restless legs syndrome: Secondary | ICD-10-CM

## 2016-02-03 DIAGNOSIS — F3130 Bipolar disorder, current episode depressed, mild or moderate severity, unspecified: Secondary | ICD-10-CM

## 2016-02-03 NOTE — Assessment & Plan Note (Signed)
Moderate OSA better sx control on CPAP  Check download  Pt education on OSA   Plan  Patient Instructions  Continue on CPAP At bedtime   CPAP download ordered  Follow up with Dr. Elsworth Soho  In 6 months and As needed

## 2016-02-03 NOTE — Addendum Note (Signed)
Addended by: Doroteo Glassman D on: 02/03/2016 03:23 PM   Modules accepted: Orders

## 2016-02-03 NOTE — Telephone Encounter (Signed)
LMOMTCB x 1 

## 2016-02-03 NOTE — Patient Instructions (Signed)
Continue on CPAP At bedtime   CPAP download ordered  Follow up with Dr. Elsworth Soho  In 6 months and As needed

## 2016-02-03 NOTE — Assessment & Plan Note (Signed)
Cont on Requip . Hope will improved on CPAP

## 2016-02-03 NOTE — Progress Notes (Signed)
Subjective:    Patient ID: [name], female    DOB: 15-May-1945, 70 y.o.   MRN: [id number]  HPI 44 -year-old female previous patient of Dr. Gwenette Greet followed for restless leg syndrome and sleep apnea  TEST  NPSG 2004:  AHI 10/hr PSG 07/2015 (214 lbs)  AHI 23/h, mild PLMs 23/h but no sig arousals  02/03/2016 Follow up : OSA  Pt returns for 2 month follow up . She was recently restarted on CPAP w/ new machine .  Says she is doing well, feels it is helping with her daytime sleepiness. She has not had to take a nap. She does need order for new cushion as hers tore this morning  Download was not available today . CPAP download was requested from DME.  Got her new machine 2 weeks ago. She has not noticed much change in her RLS but does sleep better she says.     Past Medical History:  Diagnosis Date  . Abscess of Bartholin's gland   . Acute vestibular neuronitis    Dr Lucia Gaskins  . ADHD (attention deficit hyperactivity disorder)   . Adverse effect of general anesthetic    felt paralyzed while receiving anesthesia  . Anxiety   . Bipolar 1 disorder ([place])   . Chronic kidney disease    kidney cancer- pt states she has elected to not have it treated.  . Depression    Bipolar disorder/goes to [facility] for meds  . Difficult intubation    told by [employer] that she was hard to intubate 15b yrs ago in [place]- surgery since then no problems  . Dysrhythmia    h/o atrial tachycardia- s/p ablation   . GERD (gastroesophageal reflux disease)   . H/O echocardiogram 07/2012   [place] LVF w grade I siastolic dysfunction   . History of attention deficit disorder   . History of endometriosis   . History of pleural effusion   . Hypertension   . 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 Migraines   . Obesity   . OSA (obstructive sleep apnea)    uses oral appliance instead of CPAP  . PONV (postoperative nausea and vomiting)   . PTSD (post-traumatic stress disorder)    . PVC's (premature ventricular contractions)   . Renal mass 02/15/2012  . RLS (restless legs syndrome)    Dr Gwenette Greet  . rt renal ca dx'd 12/2009   no treatment/ no surg  . Vertigo    Current Outpatient Prescriptions on File Prior to Visit  Medication Sig Dispense Refill  . aspirin 81 MG EC tablet Take 1 tablet (81 mg total) by mouth daily. For heart health 30 tablet 1  . buPROPion (WELLBUTRIN XL) 300 MG 24 hr tablet TAKE 1 TABLET (300 MG TOTAL) BY MOUTH DAILY. FOR DEPRESSION 30 tablet 1  . BYSTOLIC 2.5 MG tablet TAKE 1 TABLET BY MOUTH DAILY 30 tablet 11  . FLUoxetine (PROZAC) 40 MG capsule Take 1 capsule (40 mg total) by mouth daily. 90 capsule 0  . fluticasone (FLONASE) 50 MCG/ACT nasal spray Place 1 spray into both nostrils as needed. Sinus problems  2  . lamoTRIgine (LAMICTAL) 200 MG tablet TAKE 1 TABLET BY MOUTH EVERY DAY 90 tablet 0  . levothyroxine (SYNTHROID, LEVOTHROID) 88 MCG tablet Take 1 tablet (88 mcg total) by mouth daily before breakfast. For low thyroid function (Patient taking differently: Take 100 mcg by mouth daily before breakfast. For low thyroid function)    .  LORazepam (ATIVAN) 1 MG tablet Take 1 tablet (1 mg total) by mouth 2 (two) times daily as needed for anxiety. 180 tablet 0  . rOPINIRole (REQUIP) 0.5 MG tablet Take 1 tablet po in the afternoon and 1 tablet po qhs 90 tablet 1  . valsartan-hydrochlorothiazide (DIOVAN-HCT) 320-25 MG per tablet Take 1 tablet by mouth daily. High blood pressure control 30 tablet 0   No current facility-administered medications on file prior to visit.      Review of Systems Constitutional:   No  weight loss, night sweats,  Fevers, chills, fatigue, or  lassitude.  HEENT:   No headaches,  Difficulty swallowing,  Tooth/dental problems, or  Sore throat,                No sneezing, itching, ear ache, nasal congestion, post nasal drip,   CV:  No chest pain,  Orthopnea, PND, swelling in lower extremities, anasarca, dizziness,  palpitations, syncope.   GI  No heartburn, indigestion, abdominal pain, nausea, vomiting, diarrhea, change in bowel habits, loss of appetite, bloody stools.   Resp: No shortness of breath with exertion or at rest.  No excess mucus, no productive cough,  No non-productive cough,  No coughing up of blood.  No change in color of mucus.  No wheezing.  No chest wall deformity  Skin: no rash or lesions.  GU: no dysuria, change in color of urine, no urgency or frequency.  No flank pain, no hematuria   MS:  No joint pain or swelling.  No decreased range of motion.  No back pain.  Psych:  No change in mood or affect         Objective:   Physical Exam  Vitals:   02/03/16 1459  BP: 122/64  Pulse: 70  Temp: 98 F (36.7 C)  TempSrc: Oral  SpO2: 96%  Weight: 210 lb (95.3 kg)  Height: 5' 4.75" (1.645 m)  Body mass index is 35.22 kg/m.   GEN: A/Ox3; pleasant , NAD, obese    HEENT:  [place]/AT,  EACs-clear, TMs-wnl, NOSE-clear, THROAT-clear, no lesions, no postnasal drip or exudate noted. Class 2 MP airway   NECK:  Supple w/ fair ROM; no JVD; normal carotid impulses w/o bruits; no thyromegaly or nodules palpated; no lymphadenopathy.    RESP  Clear  P & A; w/o, wheezes/ rales/ or rhonchi. no accessory muscle use, no dullness to percussion  CARD:  RRR, no m/r/g  , no peripheral edema, pulses intact, no cyanosis or clubbing.  GI:   Soft & nt; nml bowel sounds; no organomegaly or masses detected.   Musco: Warm bil, no deformities or joint swelling noted.   Neuro: alert, no focal deficits noted.    Skin: Warm, no lesions or rashes    Horris Speros NP-C  [facility]  02/03/2016

## 2016-02-04 ENCOUNTER — Ambulatory Visit (HOSPITAL_COMMUNITY)
Admission: RE | Admit: 2016-02-04 | Discharge: 2016-02-04 | Disposition: A | Discharge status: Home / Self Care | Payer: Medicare Other | Source: Ambulatory Visit | Attending: Cardiology | Admitting: Cardiology

## 2016-02-04 DIAGNOSIS — R0602 Shortness of breath: Secondary | ICD-10-CM

## 2016-02-04 DIAGNOSIS — R079 Chest pain, unspecified: Secondary | ICD-10-CM

## 2016-02-04 LAB — POCT I-STAT CREATININE: Creatinine, Ser: 0.9 mg/dL (ref 0.44–1.00)

## 2016-02-04 MED ORDER — IOPAMIDOL (ISOVUE-370) INJECTION 76%
INTRAVENOUS | Status: AC
Start: 2016-02-04 — End: 2016-02-04
  Administered 2016-02-04: 80 mL
  Filled 2016-02-04: qty 100

## 2016-02-04 MED ORDER — NITROGLYCERIN 0.4 MG SL SUBL
0.4000 mg | SUBLINGUAL_TABLET | SUBLINGUAL | Status: DC | PRN
  Administered 2016-02-04: 0.4 mg via SUBLINGUAL

## 2016-02-04 MED ORDER — METOPROLOL TARTRATE 5 MG/5ML IV SOLN
INTRAVENOUS | Status: AC
  Filled 2016-02-04: qty 5

## 2016-02-04 MED ORDER — METOPROLOL TARTRATE 5 MG/5ML IV SOLN
5.0000 mg | INTRAVENOUS | Status: DC | PRN
  Administered 2016-02-04: 5 mg via INTRAVENOUS

## 2016-02-04 MED ORDER — NITROGLYCERIN 0.4 MG SL SUBL
SUBLINGUAL_TABLET | SUBLINGUAL | Status: AC
  Administered 2016-02-04: 0.4 mg via SUBLINGUAL
  Filled 2016-02-04: qty 1

## 2016-02-04 NOTE — Progress Notes (Signed)
Reviewed & agree with plan  

## 2016-02-05 ENCOUNTER — Ambulatory Visit (HOSPITAL_COMMUNITY): Payer: Self-pay | Admitting: Psychiatry

## 2016-02-05 NOTE — Telephone Encounter (Signed)
Called [employer] but was on hold x 5 min  WCB

## 2016-02-05 NOTE — Telephone Encounter (Signed)
TP please advise if the pt needed to be seen by RA in 1 month.  The pt stated that she was under the impression that she was to be seen in 1 month by RA, but the AVS stated 6 months.  thanks

## 2016-02-05 NOTE — Telephone Encounter (Signed)
I put in the download that she had to see if that was acceptable to DME/Insurance if so she will not need to come back until 6 months but she only had  2 weeks of data so may need to come back sooner. Can we check with DME to see.

## 2016-02-06 ENCOUNTER — Telehealth (HOSPITAL_COMMUNITY): Payer: Self-pay

## 2016-02-06 ENCOUNTER — Ambulatory Visit (HOSPITAL_COMMUNITY): Admission: RE | Admit: 2016-02-06 | Payer: Medicare Other | Source: Ambulatory Visit

## 2016-02-06 NOTE — Telephone Encounter (Signed)
Medication refill request - -Left message for patient her message was received with request for Ativan refill and would be sent to Dr. Doyne Keel who will be back in our office the morning of 02/10/16 only.  Informed patient this RN could not guarantee Dr. Doyne Keel would refill the medication as she had missed her last two evaluations on 11/06/15 and 01/22/16 and had not been evaluated by Dr. Doyne Keel since 06/03/15.  Requested patient call back to the office 04/12/15 AM to discuss with Dr. Doyne Keel if had not heard back by then regarding request.

## 2016-02-09 NOTE — Telephone Encounter (Signed)
Attempted to call [employer]. They are not open yet this morning. Will route to triage to handle later this AM.

## 2016-02-09 NOTE — Telephone Encounter (Signed)
I called and spoke with [employer] and they stated that the pt should be good for her next appt to be in 6 months.   I have called and lmom for pt.

## 2016-02-10 NOTE — Telephone Encounter (Signed)
lmtcb x2 for pt. 

## 2016-02-11 NOTE — Telephone Encounter (Signed)
lmtcb x3 for pt. 

## 2016-02-13 NOTE — Telephone Encounter (Signed)
Called and lmom to make the pt aware that she can follow up with RA in 6 months.

## 2016-02-17 ENCOUNTER — Ambulatory Visit (HOSPITAL_BASED_OUTPATIENT_CLINIC_OR_DEPARTMENT_OTHER): Payer: Medicare Other | Admitting: Oncology

## 2016-02-17 ENCOUNTER — Telehealth: Payer: Self-pay | Admitting: Oncology

## 2016-02-17 VITALS — BP 123/62 | HR 68 | Temp 97.7°F | Resp 16 | Ht 64.25 in | Wt 213.3 lb

## 2016-02-17 DIAGNOSIS — N289 Disorder of kidney and ureter, unspecified: Secondary | ICD-10-CM | POA: Diagnosis not present

## 2016-02-17 DIAGNOSIS — N2889 Other specified disorders of kidney and ureter: Secondary | ICD-10-CM

## 2016-02-17 DIAGNOSIS — F329 Major depressive disorder, single episode, unspecified: Secondary | ICD-10-CM | POA: Diagnosis not present

## 2016-02-17 NOTE — Telephone Encounter (Signed)
Appointment scheduled per 11/28 LOS. Patient given AVS report and calendars with scheduled appointments.

## 2016-02-17 NOTE — Progress Notes (Signed)
Hematology and Oncology Follow Up Visit  [name] [id number] [birth year] 70 y.o. 02/17/2016 11:57 AM  CC: [name] ([name]) Harrington Challenger, M.D.    Principle Diagnosis: 70 year old female with a renal mass, presented with 4.6 x 4.4 x 3.1 cm right renal mass suspicious for a neoplasm.  The patient refused a biopsy or primary treatment. Diagnosed in 2011.   Current therapy: Observation and surveillance. Followup CT scan in 2017 continue to show stable disease.  Interim History:  [name] presents today for a followup visit.  Since the last visit, she Continues to do very well without any recent complaints. She did report one episode of back pain and had repeat imaging studies of the abdomen and pelvis in October 2017. Her scan showed that her primary renal mass have minimally changed over the last 6 years. Her symptoms have improved since that time. She did not report any hematuria or dysuria. She has not reported any back pain. She is reporting that her depression is much improved at this time and her mood is excellent.    She does not report any headaches, blurry vision, syncope or seizures. She had not had any fevers or chills or sweats. Has not had any abdominal pain or change in her bowel habits. she does not report any lymphadenopathy or petechiae. She does report nausea but no vomiting. Rest of her review of systems unremarkable.  Medications: I have reviewed the patient's current medications. Current Outpatient Prescriptions  Medication Sig Dispense Refill  . aspirin 81 MG EC tablet Take 1 tablet (81 mg total) by mouth daily. For heart health 30 tablet 1  . buPROPion (WELLBUTRIN XL) 300 MG 24 hr tablet TAKE 1 TABLET (300 MG TOTAL) BY MOUTH DAILY. FOR DEPRESSION 30 tablet 1  . BYSTOLIC 2.5 MG tablet TAKE 1 TABLET BY MOUTH DAILY 30 tablet 11  . FLUoxetine (PROZAC) 40 MG capsule Take 1 capsule (40 mg total) by mouth daily. 90 capsule 0  . lamoTRIgine (LAMICTAL) 200 MG tablet TAKE 1 TABLET  BY MOUTH EVERY DAY 90 tablet 0  . levothyroxine (SYNTHROID, LEVOTHROID) 88 MCG tablet Take 1 tablet (88 mcg total) by mouth daily before breakfast. For low thyroid function (Patient taking differently: Take 100 mcg by mouth daily before breakfast. For low thyroid function)    . LORazepam (ATIVAN) 1 MG tablet Take 1 tablet (1 mg total) by mouth 2 (two) times daily as needed for anxiety. 180 tablet 0  . rOPINIRole (REQUIP) 0.5 MG tablet Take 1 tablet po in the afternoon and 1 tablet po qhs 90 tablet 1  . valsartan-hydrochlorothiazide (DIOVAN-HCT) 320-25 MG per tablet Take 1 tablet by mouth daily. High blood pressure control 30 tablet 0  . fluticasone (FLONASE) 50 MCG/ACT nasal spray Place 1 spray into both nostrils as needed. Sinus problems  2   No current facility-administered medications for this visit.     Allergies:  Allergies  Allergen Reactions  . Geodon [Ziprasidone Hydrochloride] Other (See Comments)    Extremely aggitated  . Lithium Nausea Only and Other (See Comments)    Off balance, increased heart rate  . Talwin [Pentazocine] Other (See Comments)    Chest pain  . Toradol [Ketorolac Tromethamine] Other (See Comments)    hallucinations  . Abilify [Aripiprazole] Other (See Comments)    jerking  . Compazine [Prochlorperazine Edisylate] Nausea And Vomiting  . Diflucan [Fluconazole] Nausea Only      Physical Exam: Blood pressure 123/62, pulse 68, temperature 97.7 F (36.5 C), temperature source  Oral, resp. rate 16, height 5' 4.25" (1.632 m), weight 213 lb 4.8 oz (96.8 kg), SpO2 99 %. ECOG: 1 General appearance: Well appearing woman appeared without distress. Head: Normocephalic, without obvious abnormality no oral ulcers or lesions. Neck: no adenopathy, no masses. Lymph nodes: Cervical, supraclavicular, and axillary nodes normal. Heart:regular rate and rhythm, S1, S2 normal, no murmur, click, rub or gallop Lung:chest clear, no wheezing, rales, normal symmetric air  entry Abdomen: soft, non-tender, without masses or organomegaly no rebound or guarding. EXT:no erythema, induration, or nodules   Lab Results: Lab Results  Component Value Date   WBC 5.2 02/18/2015   HGB 13.6 02/18/2015   HCT 42.2 02/18/2015   MCV 84.1 02/18/2015   PLT 263 02/18/2015     Chemistry      Component Value Date/Time   NA 139 02/18/2015 1440   K 4.2 02/18/2015 1440   CL 97 04/26/2013 1715   CL 103 07/21/2012 1415   CO2 29 02/18/2015 1440   BUN 19.1 02/18/2015 1440   CREATININE 0.90 02/04/2016 1149   CREATININE 0.9 02/18/2015 1440      Component Value Date/Time   CALCIUM 10.2 02/18/2015 1440   ALKPHOS 89 02/18/2015 1440   AST 20 02/18/2015 1440   ALT 16 02/18/2015 1440   BILITOT 0.37 02/18/2015 1440     CT scan on 01/09/2016: Heterogeneous enhancing mass within the lateral cortex of the right kidney measures 6.1 x 5.2 x 4.8 cm (volume = 80 cm^3). This mass measured 5.7 x 5.5 x 4.5 cm (volume = 74 cm^3) on the previous exam of 01/31/2013.  Impression and Plan:  70 year old female with the following issues:  1. Renal mass on the right side very suspicious for malignancy.  CT scan from October 2017 was reviewed and showed minimal changes in the size of the tumor. These findings indicate low-grade neoplasm. I offered her biopsy as well as surgical resection as a definitive modality but she continues to decline at this time. She is unwilling to proceed with surgery in any case and appears to be content with the current management approach. 2. Depression.  mood appears to be appropriate at this time. 3. Followup will be in 12 months' time.     Zyhir Cappella 11/28/201711:57 AM

## 2016-02-18 ENCOUNTER — Other Ambulatory Visit (HOSPITAL_COMMUNITY): Payer: Self-pay | Admitting: Psychiatry

## 2016-02-18 DIAGNOSIS — F3130 Bipolar disorder, current episode depressed, mild or moderate severity, unspecified: Secondary | ICD-10-CM

## 2016-02-18 DIAGNOSIS — F411 Generalized anxiety disorder: Secondary | ICD-10-CM

## 2016-02-18 DIAGNOSIS — F4310 Post-traumatic stress disorder, unspecified: Secondary | ICD-10-CM

## 2016-02-19 ENCOUNTER — Ambulatory Visit (INDEPENDENT_AMBULATORY_CARE_PROVIDER_SITE_OTHER): Payer: 59 | Admitting: Psychiatry

## 2016-02-19 DIAGNOSIS — F3130 Bipolar disorder, current episode depressed, mild or moderate severity, unspecified: Secondary | ICD-10-CM | POA: Diagnosis not present

## 2016-02-19 MED ORDER — BUPROPION HCL ER (XL) 300 MG PO TB24: ORAL_TABLET | ORAL | 2 refills | Status: DC

## 2016-02-19 MED ORDER — LORAZEPAM 1 MG PO TABS: 1.0000 mg | ORAL_TABLET | Freq: Two times a day (BID) | ORAL | 0 refills | Status: DC | PRN

## 2016-02-19 NOTE — Telephone Encounter (Signed)
Medication management - Patient left a message again requesting an Ativan refill to last until she can see Dr. Doyne Keel 04/15/16.  States she does not abuse it and requests new med orders to last until appointment.  Patient no showed for evaluations on 01/22/16 and 11/06/15 but has been keeping most therapy appointments.  Patient in need of Lorazepam, Lamictal, and Wellbutrin XL to last until newly scheduled appointment.  Patient requests another provider assist her with refills in Dr. Havery Moros absence.

## 2016-02-19 NOTE — Telephone Encounter (Signed)
Called in lorazepam 1 mg tabs #180 with no additional refills.  Called in wellbutrun and lamotrigine if she needed those refills as well though she was not requesting them

## 2016-02-19 NOTE — Progress Notes (Signed)
   THERAPIST PROGRESS NOTE  Session Time: 2:30-3:20  Participation Level:Active   Behavioral Response:CasualAlertMildlyEuthymic  Type of Therapy: Individual Therapy   Treatment Goals addressed: Depression  Interventions:solution-focused, supportive  Summary: JAEDIN MACNAMARA is a 70 y.o. female who presents with anxiety and depression.   Suicidal/Homicidal:Nowithout intent/plan   Therapist Response:Pt. Presents with euthymic mood, talkative, well-groomed, had energy to give herself a haircut. Pt. Reported significant improvement that she confronted her boyfriend regarding the need for healthy boundaries and recognized that he creates sensory overload for her and she needs to break occasionally. Pt. Discussed greater acceptance of not having relationship with her sisters, she will grieve the loss of the relationship that she hoped to have with them but understands that it is not possible. Counselor discussed creating her own family and Pt. Discussed plans that she has for having a small holiday party with friends. Pt. Discussed that this is first time in years that she has not been affected by seasonal affective disorder and is very happy. She has had energy to clean her apartment and has already contracted with a person to help her clean once a month. Pt. Was congratulated for her efforts and encouraged to continue to manage by doing one thing a day towards her goal of creating connection and organizing her home.  Plan:Pt. To continue with CBT based therapy and return in approximately 2 weeks.   Diagnosis:Axis I:major depressive disorder, moderate, recurrent  Axis [id number]  Nancie Neas, [facility] 02/19/2016

## 2016-02-25 ENCOUNTER — Ambulatory Visit (HOSPITAL_COMMUNITY)
Admission: RE | Admit: 2016-02-25 | Discharge: 2016-02-25 | Disposition: A | Discharge status: Home / Self Care | Payer: Medicare Other | Source: Ambulatory Visit | Attending: Cardiology | Admitting: Cardiology

## 2016-02-25 DIAGNOSIS — R079 Chest pain, unspecified: Secondary | ICD-10-CM | POA: Insufficient documentation

## 2016-02-25 DIAGNOSIS — K8020 Calculus of gallbladder without cholecystitis without obstruction: Secondary | ICD-10-CM | POA: Diagnosis not present

## 2016-02-25 DIAGNOSIS — R0602 Shortness of breath: Secondary | ICD-10-CM | POA: Diagnosis present

## 2016-02-25 MED ORDER — IOPAMIDOL (ISOVUE-300) INJECTION 61%
INTRAVENOUS | Status: AC
  Filled 2016-02-25: qty 100

## 2016-02-25 MED ORDER — NITROGLYCERIN 0.4 MG SL SUBL
SUBLINGUAL_TABLET | SUBLINGUAL | Status: AC
  Administered 2016-02-25: 0.08 mg
  Filled 2016-02-25: qty 2

## 2016-02-25 MED ORDER — IOPAMIDOL (ISOVUE-370) INJECTION 76%
INTRAVENOUS | Status: AC
  Administered 2016-02-25: 100 mL
  Filled 2016-02-25: qty 100

## 2016-02-26 ENCOUNTER — Other Ambulatory Visit (HOSPITAL_COMMUNITY): Payer: Self-pay

## 2016-03-04 ENCOUNTER — Ambulatory Visit (INDEPENDENT_AMBULATORY_CARE_PROVIDER_SITE_OTHER): Payer: 59 | Admitting: Psychiatry

## 2016-03-04 DIAGNOSIS — F3130 Bipolar disorder, current episode depressed, mild or moderate severity, unspecified: Secondary | ICD-10-CM | POA: Diagnosis not present

## 2016-03-04 NOTE — Progress Notes (Signed)
   THERAPIST PROGRESS NOTE  Session Time: 1:10-2:00  Participation Level: Active  Behavioral Response: CasualAlertEuthymic  Type of Therapy: Individual Therapy  Treatment Goals addressed: Coping  Interventions: CBT  Summary: [name] is a 70 y.o. female who presents with major depressive disorder.   Suicidal/Homicidal: Nowithout intent/plan  Therapist Response:Pt. Presents with euthymic mood, talkative, well-groomed. Pt. Reports that she is doing well, appetite and sleep are good. Pt. Reports that for the first time in several years she has not been affected by seasonal affective disorder, but continues to spend about 10 minutes a day under the sun simulation lamp. Pt. Attributes current mood to continued ability to set health boundaries with significant other. Pt. Discussed that she celebrated birthday this week and did not have contact with her sisters or daughter. Pt. Expressed acceptance of these relationships, continues to allow herself to feel the pain associated with the emotional distance, but states "I don't allow the distance to determine my ability to be happy". Pt. Discussed that she feels somewhat overwhelmed by the physical environment of her apartment but has made progress in identifying a cleaning service to help her. Pt. Discussed plan to have a holiday get together after the new year and join church fellowship.  Plan: Pt. To continue with CBT based therapy. Return again in 2 weeks.  Diagnosis: Axis I: Depressive Disorder NOS    Axis II: No diagnosis     Nancie Neas, [facility] 03/04/2016

## 2016-03-08 ENCOUNTER — Telehealth: Payer: Self-pay

## 2016-03-08 DIAGNOSIS — R931 Abnormal findings on diagnostic imaging of heart and coronary circulation: Secondary | ICD-10-CM

## 2016-03-08 DIAGNOSIS — R0602 Shortness of breath: Secondary | ICD-10-CM

## 2016-03-08 DIAGNOSIS — Z01812 Encounter for preprocedural laboratory examination: Secondary | ICD-10-CM

## 2016-03-08 NOTE — Telephone Encounter (Signed)
Informed patient of results and verbal understanding expressed.  Scheduled patient tomorrow, 12/19 with B. Rosita Fire, PA to discuss catheterization with FFR of LAD. She understands to come fasting to check cholesterol as well as pre-cath labs.  Labs ordered.  Patient also states she has experiences some palpitations in the last couple weeks. She does not report any other symptoms or worsening SOB when she gets them.  She will discuss further with PA tomorrow.

## 2016-03-08 NOTE — Telephone Encounter (Signed)
-----   Message from Sueanne Margarita, MD sent at 02/26/2016 10:53 PM EST ----- Coronary CTA showed increased coronary calcium score and moderate disease of the ostial/proximal LAD and moderate-possibly severe- stenosis of the mid LAD  And 25-50% RCA - given her worsening SOB please set up for left heart cath with FFR of the LAD

## 2016-03-09 ENCOUNTER — Other Ambulatory Visit: Payer: Self-pay

## 2016-03-09 ENCOUNTER — Ambulatory Visit: Payer: Self-pay | Admitting: Cardiology

## 2016-03-19 ENCOUNTER — Ambulatory Visit (HOSPITAL_COMMUNITY): Payer: Medicare Other | Attending: Cardiovascular Disease

## 2016-03-23 ENCOUNTER — Ambulatory Visit (HOSPITAL_COMMUNITY): Payer: Self-pay | Admitting: Psychiatry

## 2016-03-23 ENCOUNTER — Other Ambulatory Visit (HOSPITAL_COMMUNITY): Payer: Self-pay | Admitting: Psychiatry

## 2016-03-23 DIAGNOSIS — F3130 Bipolar disorder, current episode depressed, mild or moderate severity, unspecified: Secondary | ICD-10-CM

## 2016-03-26 ENCOUNTER — Ambulatory Visit: Payer: Self-pay | Admitting: Cardiology

## 2016-03-26 ENCOUNTER — Other Ambulatory Visit: Payer: Self-pay

## 2016-04-02 ENCOUNTER — Other Ambulatory Visit (HOSPITAL_COMMUNITY): Payer: Self-pay | Admitting: Psychiatry

## 2016-04-02 DIAGNOSIS — F3130 Bipolar disorder, current episode depressed, mild or moderate severity, unspecified: Secondary | ICD-10-CM

## 2016-04-06 ENCOUNTER — Encounter: Payer: Self-pay | Admitting: Cardiology

## 2016-04-06 ENCOUNTER — Ambulatory Visit (INDEPENDENT_AMBULATORY_CARE_PROVIDER_SITE_OTHER): Payer: Medicare Other | Admitting: Cardiology

## 2016-04-06 ENCOUNTER — Ambulatory Visit (INDEPENDENT_AMBULATORY_CARE_PROVIDER_SITE_OTHER): Payer: 59 | Admitting: Psychiatry

## 2016-04-06 VITALS — BP 120/72 | HR 64 | Ht 65.0 in | Wt 209.0 lb

## 2016-04-06 DIAGNOSIS — F3130 Bipolar disorder, current episode depressed, mild or moderate severity, unspecified: Secondary | ICD-10-CM

## 2016-04-06 DIAGNOSIS — R0600 Dyspnea, unspecified: Secondary | ICD-10-CM

## 2016-04-06 DIAGNOSIS — R0609 Other forms of dyspnea: Secondary | ICD-10-CM

## 2016-04-06 NOTE — Progress Notes (Signed)
04/06/2016 [name]   21-Feb-1946  [id number]  Primary Physician Gerrit Heck, MD Primary Cardiologist: Dr. Radford Pax    Reason for Visit/CC: Exertional Dyspnea and Abnormal Coronary CTA  HPI:  The patient is a 71 y/o female, followed by Dr. Radford Pax of PVC's, diastolic dysfunction, PAT s/p ablation and HTN. Also with h/o bipolar disorder and sever depression and anxiety, per patient report.   She recently underwent w/u for exertional dyspnea. Dr. Radford Pax ordered for her to undergo a coronary CTA and 2D echo. Pt completed order for her CT scan but failed to get her echo done. Her coronary CTA was performed 02/25/16. This was an abnormal study, showing Coronary calcium score of 364. This was 86 percentile for age and sex matched control. Normal coronary origin with right dominance, one vessel CAD with moderate disease in the ostial/proximal LAD and moderate, possibly severe stenosis in the mid LAD. The results were reviewed by Dr. Radford Pax who recommended proceeding with a LHC with FFR testing of the LAD.  Today in clinic, the patient is very tearful. She opened up regarding her struggles with depression. She is dealing with a lot of life stressors, including divorce. I spent 20 minutes explaining indication for cath and risk of MI/ SCD w/o treatment of CAD, if truly severe. She understands these risk and states that she does not want to schedule cath today. She wants to go home and think about it and states that she will call back later this week.  She denies thoughts of suicidal/homacidal ideation. She follows up closely with her PCP.   From a symptom standpoint, she continues to have exertional dyspnea but denies CP and symptoms at rest.     Current Meds  Medication Sig  . aspirin 81 MG EC tablet Take 1 tablet (81 mg total) by mouth daily. For heart health  . buPROPion (WELLBUTRIN XL) 300 MG 24 hr tablet TAKE 1 TABLET (300 MG TOTAL) BY MOUTH DAILY. FOR DEPRESSION  .  BYSTOLIC 2.5 MG tablet TAKE 1 TABLET BY MOUTH DAILY  . FLUoxetine (PROZAC) 40 MG capsule Take 1 capsule (40 mg total) by mouth daily.  . fluticasone (FLONASE) 50 MCG/ACT nasal spray Place 1 spray into both nostrils as needed. Sinus problems  . lamoTRIgine (LAMICTAL) 200 MG tablet TAKE 1 TABLET BY MOUTH EVERY DAY  . levothyroxine (SYNTHROID, LEVOTHROID) 88 MCG tablet Take 1 tablet (88 mcg total) by mouth daily before breakfast. For low thyroid function (Patient taking differently: Take 100 mcg by mouth daily before breakfast. For low thyroid function)  . LORazepam (ATIVAN) 1 MG tablet Take 1 tablet (1 mg total) by mouth 2 (two) times daily as needed for anxiety.  Marland Kitchen rOPINIRole (REQUIP) 0.5 MG tablet Take 0.5 mg by mouth at bedtime.  . valsartan-hydrochlorothiazide (DIOVAN-HCT) 320-25 MG per tablet Take 1 tablet by mouth daily. High blood pressure control   Allergies  Allergen Reactions  . Geodon [Ziprasidone Hydrochloride] Other (See Comments)    Extremely aggitated  . Lithium Nausea Only and Other (See Comments)    Off balance, increased heart rate  . Talwin [Pentazocine] Other (See Comments)    Chest pain  . Toradol [Ketorolac Tromethamine] Other (See Comments)    hallucinations  . Abilify [Aripiprazole] Other (See Comments)    jerking  . Compazine [Prochlorperazine Edisylate] Nausea And Vomiting  . Diflucan [Fluconazole] Nausea Only   Past Medical History:  Diagnosis Date  . Abscess of Bartholin's gland   . Acute vestibular neuronitis  Dr Lucia Gaskins  . ADHD (attention deficit hyperactivity disorder)   . Adverse effect of general anesthetic    felt paralyzed while receiving anesthesia  . Anxiety   . Bipolar 1 disorder ([place])   . Chronic kidney disease    kidney cancer- pt states she has elected to not have it treated.  . Depression    Bipolar disorder/goes to [facility] for meds  . Difficult intubation    told by [facility] that she was hard to intubate 15b yrs ago in [place]- surgery  since then no problems  . Dysrhythmia    h/o atrial tachycardia- s/p ablation   . GERD (gastroesophageal reflux disease)   . H/O echocardiogram 07/2012   Normla LVF w grade I siastolic dysfunction   . History of attention deficit disorder   . History of endometriosis   . History of pleural effusion   . Hypertension   . 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 Migraines   . Obesity   . OSA (obstructive sleep apnea)    uses oral appliance instead of CPAP  . PONV (postoperative nausea and vomiting)   . PTSD (post-traumatic stress disorder)   . PVC's (premature ventricular contractions)   . Renal mass 02/15/2012  . RLS (restless legs syndrome)    Dr Gwenette Greet  . rt renal ca dx'd 12/2009   no treatment/ no surg  . Vertigo    Family History  Problem Relation Age of Onset  . Allergies Father   . Skin cancer Father   . Bipolar disorder Father   . Alcohol abuse Father   . Heart disease Mother   . Depression Mother   . Hypertension Mother   . CAD Mother   . Colon cancer Paternal Grandmother   . OCD Paternal Grandmother   . Allergies Sister     multiple  . Allergies Brother     multiple  . Allergies Daughter   . Heart disease Maternal Grandfather   . Cancer Paternal Grandfather    Past Surgical History:  Procedure Laterality Date  . CARDIAC ELECTROPHYSIOLOGY [place] AND ABLATION  2000s  . nasoseptal reconstruction  1990s  . THYROIDECTOMY  1990  . TONSILLECTOMY     as a child  . TUBAL LIGATION  1970s  . uterine mass removal  03/2012   was found to be benign   Social History   Social History  . Marital status: Single    Spouse name: N/A  . Number of children: N/A  . Years of education: N/A   Occupational History  . 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 Smoking status: Never Smoker  . Smokeless tobacco: Never Used  . Alcohol use No  . Drug use: No  . Sexual activity: Yes    Birth control/  protection: None   Other Topics Concern  . Not on file   Social History Narrative   Divorced and lives alone   Has children     Review of Systems: General: negative for chills, fever, night sweats or weight changes.  Cardiovascular: negative for chest pain, dyspnea on exertion, edema, orthopnea, palpitations, paroxysmal nocturnal dyspnea or shortness of breath Dermatological: negative for rash Respiratory: negative for cough or wheezing Urologic: negative for hematuria Abdominal: negative for nausea, vomiting, diarrhea, bright red blood per rectum, melena, or hematemesis Neurologic: negative for visual changes, syncope, or dizziness All other systems reviewed and are otherwise negative except as noted above.  Physical Exam:  Blood pressure 120/72, pulse 64, height 5\' 5"  (1.651 m), weight 209 lb (94.8 kg).  General appearance: alert, cooperative and no distress Neck: no carotid bruit and no JVD Lungs: clear to auscultation bilaterally Heart: regular rate and rhythm, S1, S2 normal, no murmur, click, rub or gallop Extremities: extremities normal, atraumatic, no cyanosis or edema Pulses: 2+ and symmetric Skin: Skin color, texture, turgor normal. No rashes or lesions Neurologic: Grossly normal  EKG not performed.   ASSESSMENT AND PLAN:   1. Exertional Dyspnea + Abnormal Coronary CTA: Coronary calcium score of 364. This was 66 percentile for age and sex matched control. Normal coronary origin with right dominance, one vessel CAD with moderate disease in the ostial/proximal LAD and moderate, possibly severe stenosis in the mid LAD. Definitive LHC with FFR testing of LAD recommended.   I spent 20 minutes explaining indication for cath and risk of MI/ SCD w/o treatment of CAD, if truly severe. She understands these risk and states that she does not want to schedule cath today. She wants to go home and think about it and states that she will call back later this week. She is having  issues with depression currently but denies suicidal/ homicidal ideation. Will notify Dr. Radford Pax and will have our staff f/u with her later this week regarding scheduling her cath. She denies symptoms at rest. VSS.    Lyda Jester PA-C 04/06/2016 12:33 PM

## 2016-04-06 NOTE — Patient Instructions (Addendum)
Medication Instructions:   Your physician recommends that you continue on your current medications as directed. Please refer to the Current Medication list given to you today.   If you need a refill on your cardiac medications before your next appointment, please call your pharmacy.  Labwork: NONE ORDERED  TODAY    Testing/Procedures: NONE ORDERED  TODAY     Follow-Up: CONTACT BACK WHEN YOU READY TO SCHEDULED  FOR PROCEDURE    Any Other Special Instructions Will Be Listed Below (If Applicable).

## 2016-04-12 ENCOUNTER — Telehealth (HOSPITAL_COMMUNITY): Payer: Self-pay

## 2016-04-12 NOTE — Telephone Encounter (Signed)
Patient is calling for a refill on her Prozac, she has not been seen since March, has been out for 2 days and has a follow up on Thursday - please advise, thank you

## 2016-04-12 NOTE — Progress Notes (Signed)
   THERAPIST PROGRESS NOTE  Session Time: 1:10-2:00  Participation Level: Active  Behavioral Response: CasualAlertEuthymic  Type of Therapy: Individual Therapy  Treatment Goals addressed: Coping  Interventions: strength-based; solution focused  Summary: [name] is a 71 y.o. female who presents with major depressive disorder.   Suicidal/Homicidal: Nowithout intent/plan  Therapist Response:Pt. Continues to present with euthymic mood talkative, well-groomed. Pt. Reports that her depression was manageable throughout the holidays, that she was able to maintain her self-care despite becoming very sick and having to provide care for her boyfriend who was also sick with pneomonia. Pt. Discussed that she had no contact with her daughter or sisters during the holidays. Pt. Discussed that the emotional distance did not cause her severe pain as in previous years and she is recognizing that the sense of loss related to her sisters is deeper than the loss related to her child. Session focused on need for connection and community involvement. Counselor introduced Public librarian and processed with Pt. How it meets her interests and needs for connection. Pt indicated that she would be able to stand for the required amount of time and that transportation and travel to the location would not be problems. Pt. Processed with Pt. Potential barriers to participating in the opportunity. Pt. Committed to attending one of two trainings for the volunteer opportunity and Counselor would follow-up with Pt. Next week for accountability.  Plan: Pt. To continue with CBT based therapy. Return again in 2 weeks.  Diagnosis:      Axis I: Depressive Disorder NOS                          Axis II: No diagnosis   Nancie Neas, [facility] 04/12/2016

## 2016-04-15 ENCOUNTER — Ambulatory Visit (HOSPITAL_BASED_OUTPATIENT_CLINIC_OR_DEPARTMENT_OTHER): Payer: 59 | Admitting: Psychiatry

## 2016-04-15 ENCOUNTER — Encounter (HOSPITAL_COMMUNITY): Payer: Self-pay | Admitting: Psychiatry

## 2016-04-15 DIAGNOSIS — Z800 Family history of malignant neoplasm of digestive organs: Secondary | ICD-10-CM

## 2016-04-15 DIAGNOSIS — Z811 Family history of alcohol abuse and dependence: Secondary | ICD-10-CM

## 2016-04-15 DIAGNOSIS — Z79899 Other long term (current) drug therapy: Secondary | ICD-10-CM

## 2016-04-15 DIAGNOSIS — Z8249 Family history of ischemic heart disease and other diseases of the circulatory system: Secondary | ICD-10-CM | POA: Diagnosis not present

## 2016-04-15 DIAGNOSIS — Z818 Family history of other mental and behavioral disorders: Secondary | ICD-10-CM

## 2016-04-15 DIAGNOSIS — F411 Generalized anxiety disorder: Secondary | ICD-10-CM

## 2016-04-15 DIAGNOSIS — F4310 Post-traumatic stress disorder, unspecified: Secondary | ICD-10-CM | POA: Diagnosis not present

## 2016-04-15 DIAGNOSIS — F3130 Bipolar disorder, current episode depressed, mild or moderate severity, unspecified: Secondary | ICD-10-CM

## 2016-04-15 DIAGNOSIS — Z808 Family history of malignant neoplasm of other organs or systems: Secondary | ICD-10-CM | POA: Diagnosis not present

## 2016-04-15 DIAGNOSIS — Z7982 Long term (current) use of aspirin: Secondary | ICD-10-CM

## 2016-04-15 MED ORDER — LORAZEPAM 1 MG PO TABS: 1.0000 mg | ORAL_TABLET | Freq: Two times a day (BID) | ORAL | 1 refills | Status: DC | PRN

## 2016-04-15 MED ORDER — LAMOTRIGINE 200 MG PO TABS: 200.0000 mg | ORAL_TABLET | Freq: Every day | ORAL | 1 refills | Status: DC

## 2016-04-15 MED ORDER — FLUOXETINE HCL 40 MG PO CAPS: 40.0000 mg | ORAL_CAPSULE | Freq: Every day | ORAL | 1 refills | Status: DC

## 2016-04-15 MED ORDER — METHYLPHENIDATE HCL 5 MG PO TABS: 5.0000 mg | ORAL_TABLET | Freq: Every day | ORAL | 0 refills | Status: DC

## 2016-04-15 MED ORDER — BUPROPION HCL ER (XL) 300 MG PO TB24: ORAL_TABLET | ORAL | 1 refills | Status: DC

## 2016-04-15 NOTE — Progress Notes (Signed)
Patient ID: [name], female   DOB: 10-09-1945, 71 y.o.   MRN: [id number]  [place] [id number] Progress Note  HALEI SINEATH [id number] 71 y.o.  04/15/2016 11:37 AM  Chief Complaint: "2 days I was not in a good place"  History of Present Illness: reviewed information below with patient on 04/15/16 and same as previous visits except as noted  States everything is bad. Anxiety is high. She is very irritable. She is having racing thoughts and restless. She is taking Ativan BID and it relaxes her.   Pt reports depression is getting worse. She feels down several times week. Reports anhedonia, isolation, crying spells, low motivation, poor hygiene, worthlessness and hopelessness. Concentration is poor. Pt is not engaging hobbies and is not social. States this is the time of her year she has SAD. Pt is using her light box about 30 min a day and states it is not working.    Sleep is variable. She is up late and doesn't wake up till 10am-12pm. Energy is low and she naps a lot.  PTSD- worse.  HV and startle reaction are present. She does experience a lot of anger towards her father. Pt is having nightmares and intrusive memories due to recent triggers.   Wellbutrin, Lamictal, Ativan and Prozac are effective and denies SE. Reports she restarted 5mg  2 days ago.   Pt continues to attend therapy sessions.   Suicidal Ideation: No Plan Formed: No Patient has means to carry out plan: No  Homicidal Ideation: No Plan Formed: No Patient has means to carry out plan: No  Review of Systems: Psychiatric: Agitation: Yes Hallucination: No Depressed Mood: Yes Insomnia: Yes Hypersomnia: Yes Altered Concentration: Yes but she takes Ritalin and it helps Feels Worthless: Yes Grandiose Ideas: No Belief In Special Powers: No New/Increased Substance Abuse: No Compulsions: No  Review of Systems  Gastrointestinal: Positive for abdominal pain and nausea. Negative for vomiting.   Musculoskeletal: Positive for back pain, joint pain and neck pain.  Neurological: Negative for dizziness, tremors, sensory change, seizures and headaches.  Psychiatric/Behavioral: Positive for depression. Negative for hallucinations, substance abuse and suicidal ideas. The patient is nervous/anxious and has insomnia.     Past Medical Family, Social History: reviewed information below with patient on 04/15/16 and same as previous visits except as noted  Pt is divorced and living in [place]. Pt lives alone. Unemployed. She has one daughter.  reports that she has never smoked. She has never used smokeless tobacco. She reports that she does not drink alcohol or use drugs.  Family History  Problem Relation Age of Onset  . Allergies Father   . Skin cancer Father   . Bipolar disorder Father   . Alcohol abuse Father   . Heart disease Mother   . Depression Mother   . Hypertension Mother   . CAD Mother   . Colon cancer Paternal Grandmother   . OCD Paternal Grandmother   . Allergies Sister     multiple  . Allergies Brother     multiple  . Allergies Daughter   . Heart disease Maternal Grandfather   . Cancer Paternal Grandfather    Past Medical History:  Diagnosis Date  . Abscess of Bartholin's gland   . Acute vestibular neuronitis    Dr Lucia Gaskins  . ADHD (attention deficit hyperactivity disorder)   . Adverse effect of general anesthetic    felt paralyzed while receiving anesthesia  . Anxiety   . Bipolar 1 disorder ([place])   .  Chronic kidney disease    kidney cancer- pt states she has elected to not have it treated.  . Depression    Bipolar disorder/goes to [facility] for meds  . Difficult intubation    told by [facility] that she was hard to intubate 15b yrs ago in [place]- surgery since then no problems  . Dysrhythmia    h/o atrial tachycardia- s/p ablation   . GERD (gastroesophageal reflux disease)   . H/O echocardiogram 07/2012   Normla LVF w grade I siastolic dysfunction   . History  of attention deficit disorder   . History of endometriosis   . History of pleural effusion   . Hypertension   . 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 Migraines   . Obesity   . OSA (obstructive sleep apnea)    uses oral appliance instead of CPAP  . PONV (postoperative nausea and vomiting)   . PTSD (post-traumatic stress disorder)   . PVC's (premature ventricular contractions)   . Renal mass 02/15/2012  . RLS (restless legs syndrome)    Dr Gwenette Greet  . rt renal ca dx'd 12/2009   no treatment/ no surg  . Vertigo      Outpatient Encounter Prescriptions as of 04/15/2016  Medication Sig  . aspirin 81 MG EC tablet Take 1 tablet (81 mg total) by mouth daily. For heart health  . buPROPion (WELLBUTRIN XL) 300 MG 24 hr tablet TAKE 1 TABLET (300 MG TOTAL) BY MOUTH DAILY. FOR DEPRESSION  . BYSTOLIC 2.5 MG tablet TAKE 1 TABLET BY MOUTH DAILY  . FLUoxetine (PROZAC) 40 MG capsule Take 1 capsule (40 mg total) by mouth daily.  . fluticasone (FLONASE) 50 MCG/ACT nasal spray Place 1 spray into both nostrils as needed. Sinus problems  . lamoTRIgine (LAMICTAL) 200 MG tablet TAKE 1 TABLET BY MOUTH EVERY DAY  . levothyroxine (SYNTHROID, LEVOTHROID) 88 MCG tablet Take 1 tablet (88 mcg total) by mouth daily before breakfast. For low thyroid function (Patient taking differently: Take 100 mcg by mouth daily before breakfast. For low thyroid function)  . LORazepam (ATIVAN) 1 MG tablet Take 1 tablet (1 mg total) by mouth 2 (two) times daily as needed for anxiety.  . methylphenidate (RITALIN) 5 MG tablet Take 5 mg by mouth 2 (two) times daily.  Marland Kitchen rOPINIRole (REQUIP) 0.5 MG tablet Take 0.5 mg by mouth at bedtime.  . valsartan-hydrochlorothiazide (DIOVAN-HCT) 320-25 MG per tablet Take 1 tablet by mouth daily. High blood pressure control   No facility-administered encounter medications on file as of 04/15/2016.     Past Psychiatric History/Hospitalization(s): Anxiety: Yes Bipolar  Disorder: Yes Depression: Yes Mania: Yes Psychosis: No Schizophrenia: No Personality Disorder: No Hospitalization for psychiatric illness: Yes History of Electroconvulsive Shock Therapy: No Prior Suicide Attempts: No  Physical Exam: Constitutional:  BP 140/78   Pulse 70   Ht 5' 4.5" (1.638 m)   Wt 209 lb 6.4 oz (95 kg)   BMI 35.39 kg/m   General Appearance: alert, oriented, no acute distress  Musculoskeletal: Strength & Muscle Tone: within normal limits Gait & Station: normal Patient leans: straight  Mental Status Examination/Evaluation: reviewed MSE on 04/15/16 and same as previous visits except as noted  Objective: Attitude: Calm and cooperative  Appearance: Casual, appears to be stated age  Eye Contact::  Fair  Speech:  Clear and Coherent and Normal Rate  Volume:  Normal  Mood:  depressed  Affect:  Depressed and Tearful  Thought Process:  Intact, Linear  and Logical  Orientation:  Full (Time, Place, and Person)  Thought Content:  Negative  Suicidal Thoughts:  No  Homicidal Thoughts:  No  Judgement:  Fair  Insight:  Fair  Concentration: good  Memory: Immediate-intact Recent-intact Remote-intact  Recall: fair  Language: fair  Gait and Station: normal  [employer] of Knowledge: average  Psychomotor Activity:  Normal  Akathisia:  No  Handed:  Right  AIMS (if indicated):  n/a  Assets: desire for improvement Clinical biochemist Social support      reviewed A&P on 04/15/16 and same as previous visits except as noted   Assessment: Axis I: Bipolar d/o- current episode depressed;  PTSD; GAD Axis II: deferred   Plan:  Ativan 1mg  BID prn anxiety Lamictal 200mg  po qD (Pt was only taking 150mg  previously) for mood stabalization Continue Wellbutrin XL 300mg  po qD for depression Continue Prozac 40mg  po qD for depression and anxiety and PTSD Restart Ritalin 5mg  po qD for mood augmentation Pt doesn't want meds changed today.   Medication  management with supportive therapy. Risks/benefits and SE of the medication discussed. Pt verbalized understanding and verbal consent obtained for treatment.  Affirm with the patient that the medications are taken as ordered. Patient expressed understanding of how their medications were to be used.  -worsening of symptoms  Recommended pt use light box or sitting in direct sunlight for 1-2 hrs/day  Reviewed labs from 02/07/2014: CBC WNL, CMP WNL,     Therapy: brief supportive therapy provided. Discussed psychosocial stressors in detail.  Encouraged to continue individual therapy with Anderson Malta.   Pt denies SI and is at an acute low risk for suicide.Patient told to call clinic if any problems occur. Patient advised to go to ER if they should develop SI/HI, side effects, or if symptoms worsen. Has crisis numbers to call if needed. Pt verbalized understanding.  F/up in 6 weeks or sooner if needed  [name], MD 04/15/2016

## 2016-04-20 ENCOUNTER — Ambulatory Visit (INDEPENDENT_AMBULATORY_CARE_PROVIDER_SITE_OTHER): Payer: 59 | Admitting: Psychiatry

## 2016-04-20 DIAGNOSIS — F3130 Bipolar disorder, current episode depressed, mild or moderate severity, unspecified: Secondary | ICD-10-CM
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
## 2016-04-28 ENCOUNTER — Telehealth: Payer: Self-pay

## 2016-04-28 NOTE — Telephone Encounter (Signed)
Left message to call back  

## 2016-04-28 NOTE — Telephone Encounter (Signed)
-----   Message from Consuelo Pandy, [place] sent at 04/06/2016 12:56 PM EST ----- Please f/u later this week to see If pt wants to schedule LHC with FFR testing of LAD. I already discussed potential risk of cath. Thanks.

## 2016-04-29 ENCOUNTER — Ambulatory Visit (HOSPITAL_COMMUNITY): Payer: Self-pay | Admitting: Psychiatry

## 2016-04-29 ENCOUNTER — Other Ambulatory Visit: Payer: Self-pay | Admitting: Family Medicine

## 2016-04-29 DIAGNOSIS — Z1231 Encounter for screening mammogram for malignant neoplasm of breast: Secondary | ICD-10-CM

## 2016-05-04 NOTE — Telephone Encounter (Signed)
Patient now agrees to heart catheterization, but cannot schedule this week because she does not feel well and thinks she is getting bronchitis.  Scheduled patient for OV to arrange cath in one week. She will reschedule if she does not feel well. She will call PCP in the mean time. She was grateful for call.

## 2016-05-05 ENCOUNTER — Ambulatory Visit (INDEPENDENT_AMBULATORY_CARE_PROVIDER_SITE_OTHER): Payer: 59 | Admitting: Psychiatry

## 2016-05-05 DIAGNOSIS — F3130 Bipolar disorder, current episode depressed, mild or moderate severity, unspecified: Secondary | ICD-10-CM

## 2016-05-10 ENCOUNTER — Encounter: Payer: Self-pay | Admitting: Cardiology

## 2016-05-11 ENCOUNTER — Encounter: Payer: Self-pay | Admitting: Cardiology

## 2016-05-11 ENCOUNTER — Encounter: Payer: Self-pay | Admitting: *Deleted

## 2016-05-11 ENCOUNTER — Encounter (HOSPITAL_COMMUNITY): Payer: Self-pay | Admitting: Psychiatry

## 2016-05-11 ENCOUNTER — Ambulatory Visit (INDEPENDENT_AMBULATORY_CARE_PROVIDER_SITE_OTHER): Payer: Medicare Other | Admitting: Cardiology

## 2016-05-11 ENCOUNTER — Ambulatory Visit (INDEPENDENT_AMBULATORY_CARE_PROVIDER_SITE_OTHER): Payer: 59 | Admitting: Psychiatry

## 2016-05-11 VITALS — BP 102/62 | HR 83 | Ht 65.0 in | Wt 212.1 lb

## 2016-05-11 DIAGNOSIS — F3130 Bipolar disorder, current episode depressed, mild or moderate severity, unspecified: Secondary | ICD-10-CM

## 2016-05-11 DIAGNOSIS — N189 Chronic kidney disease, unspecified: Secondary | ICD-10-CM

## 2016-05-11 DIAGNOSIS — Z808 Family history of malignant neoplasm of other organs or systems: Secondary | ICD-10-CM

## 2016-05-11 DIAGNOSIS — Z8249 Family history of ischemic heart disease and other diseases of the circulatory system: Secondary | ICD-10-CM

## 2016-05-11 DIAGNOSIS — F4310 Post-traumatic stress disorder, unspecified: Secondary | ICD-10-CM | POA: Diagnosis not present

## 2016-05-11 DIAGNOSIS — Z01818 Encounter for other preprocedural examination: Secondary | ICD-10-CM

## 2016-05-11 DIAGNOSIS — Z811 Family history of alcohol abuse and dependence: Secondary | ICD-10-CM | POA: Diagnosis not present

## 2016-05-11 DIAGNOSIS — F411 Generalized anxiety disorder: Secondary | ICD-10-CM | POA: Diagnosis not present

## 2016-05-11 DIAGNOSIS — Z800 Family history of malignant neoplasm of digestive organs: Secondary | ICD-10-CM | POA: Diagnosis not present

## 2016-05-11 DIAGNOSIS — Z7982 Long term (current) use of aspirin: Secondary | ICD-10-CM

## 2016-05-11 DIAGNOSIS — Z818 Family history of other mental and behavioral disorders: Secondary | ICD-10-CM

## 2016-05-11 DIAGNOSIS — Z79899 Other long term (current) drug therapy: Secondary | ICD-10-CM

## 2016-05-11 MED ORDER — LORAZEPAM 1 MG PO TABS: 1.0000 mg | ORAL_TABLET | Freq: Two times a day (BID) | ORAL | 1 refills | Status: DC | PRN

## 2016-05-11 MED ORDER — METHYLPHENIDATE HCL 10 MG PO TABS: 10.0000 mg | ORAL_TABLET | Freq: Every day | ORAL | 0 refills | Status: DC

## 2016-05-11 MED ORDER — FLUOXETINE HCL 20 MG PO CAPS: 60.0000 mg | ORAL_CAPSULE | Freq: Every day | ORAL | 1 refills | Status: DC

## 2016-05-11 MED ORDER — BUPROPION HCL ER (XL) 300 MG PO TB24: ORAL_TABLET | ORAL | 1 refills | Status: DC

## 2016-05-11 MED ORDER — LAMOTRIGINE 200 MG PO TABS: 200.0000 mg | ORAL_TABLET | Freq: Every day | ORAL | 1 refills | Status: DC

## 2016-05-11 NOTE — Progress Notes (Signed)
Patient ID: [name], female   DOB: 1945-03-25, 71 y.o.   MRN: [id number]  [place] [address] Progress Note  [name] [id number] 71 y.o.  05/11/2016 7:58 AM  Chief Complaint: "terrible"  History of Present Illness: reviewed information below with patient on 05/11/16 and same as previous visits except as noted Reports her boyfriend had the flu and she thinks she may have caught it. She is having dizziness, nausea and chills.   States "everything is still bad".  Anxiety is overall unchanged. She is having racing thoughts and restless. Pt is scared to go out because something will happen to her. She has missed 2 trips due to overwhelming anxiety.  She is taking Ativan BID and it relaxes her.   Irritability remains high.   Pt reports depression is getting worse. She spent 3 days in bed. She did not change or bath. Reports anhedonia, isolation, crying spells, low motivation, worthlessness and hopelessness. Concentration is poor. Pt is not engaging hobbies and is not social. States this is the time of her year she has SAD. Pt is using her light box about 30 min a day and states it is not working.    Sleep is poor and she is waking multiple times a night. She has poor energy and is napping durnig the day as well.   PTSD- stable. HV and startle reaction are present. She does experience a lot of anger towards her father. Pt is having nightmares and intrusive memories due to recent triggers. They are occurring about once a week.   Wellbutrin, Lamictal, Ativan and Prozac are effective and denies SE.    Pt continues to attend therapy sessions.   Suicidal Ideation: No passive thoughts of death on/off Plan Formed: No Patient has means to carry out plan: No  Homicidal Ideation: No Plan Formed: No Patient has means to carry out plan: No  Review of Systems: Psychiatric: Agitation: Yes Hallucination: No Depressed Mood: Yes Insomnia: Yes Hypersomnia: Yes Altered  Concentration: Yes but she takes Ritalin and it helps Feels Worthless: Yes Grandiose Ideas: No Belief In Special Powers: No New/Increased Substance Abuse: No Compulsions: No  Review of Systems  Constitutional: Positive for chills. Negative for fever and malaise/fatigue.  HENT: Negative for congestion, nosebleeds and sore throat.   Gastrointestinal: Positive for nausea. Negative for abdominal pain and vomiting.  Neurological: Positive for dizziness. Negative for tremors, sensory change, seizures, weakness and headaches.  Psychiatric/Behavioral: Positive for depression. Negative for hallucinations, substance abuse and suicidal ideas. The patient is nervous/anxious and has insomnia.     Past Medical Family, Social History: reviewed information below with patient on 05/11/16 and same as previous visits except as noted  Pt is divorced and living in [place]. Pt lives alone. Unemployed. She has one daughter.  reports that she has never smoked. She has never used smokeless tobacco. She reports that she does not drink alcohol or use drugs.  Family History  Problem Relation Age of Onset  . Allergies Father   . Skin cancer Father   . Bipolar disorder Father   . Alcohol abuse Father   . Heart disease Mother   . Depression Mother   . Hypertension Mother   . CAD Mother   . Colon cancer Paternal Grandmother   . OCD Paternal Grandmother   . Allergies Sister     multiple  . Allergies Brother     multiple  . Allergies Daughter   . Heart disease Maternal Grandfather   .  Cancer Paternal Grandfather    Past Medical History:  Diagnosis Date  . Abscess of Bartholin's gland   . Acute vestibular neuronitis    Dr Lucia Gaskins  . ADHD (attention deficit hyperactivity disorder)   . Adverse effect of general anesthetic    felt paralyzed while receiving anesthesia  . Anxiety   . Bipolar 1 disorder ([place])   . Chronic kidney disease    kidney cancer- pt states she has elected to not have it treated.   . Depression    Bipolar disorder/goes to [facility] for meds  . Difficult intubation    told by [facility] that she was hard to intubate 15b yrs ago in [place]- surgery since then no problems  . Dysrhythmia    h/o atrial tachycardia- s/p ablation   . GERD (gastroesophageal reflux disease)   . H/O echocardiogram 07/2012   Normla LVF w grade I siastolic dysfunction   . History of attention deficit disorder   . History of endometriosis   . History of pleural effusion   . Hypertension   . 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 Migraines   . Obesity   . OSA (obstructive sleep apnea)    uses oral appliance instead of CPAP  . PONV (postoperative nausea and vomiting)   . PTSD (post-traumatic stress disorder)   . PVC's (premature ventricular contractions)   . Renal mass 02/15/2012  . RLS (restless legs syndrome)    Dr Gwenette Greet  . rt renal ca dx'd 12/2009   no treatment/ no surg  . Vertigo      Outpatient Encounter Prescriptions as of 05/11/2016  Medication Sig  . aspirin 81 MG EC tablet Take 1 tablet (81 mg total) by mouth daily. For heart health  . buPROPion (WELLBUTRIN XL) 300 MG 24 hr tablet TAKE 1 TABLET (300 MG TOTAL) BY MOUTH DAILY. FOR DEPRESSION  . BYSTOLIC 2.5 MG tablet TAKE 1 TABLET BY MOUTH DAILY  . FLUoxetine (PROZAC) 40 MG capsule Take 1 capsule (40 mg total) by mouth daily.  . fluticasone (FLONASE) 50 MCG/ACT nasal spray Place 1 spray into both nostrils as needed. Sinus problems  . lamoTRIgine (LAMICTAL) 200 MG tablet Take 1 tablet (200 mg total) by mouth daily.  Marland Kitchen levothyroxine (SYNTHROID, LEVOTHROID) 88 MCG tablet Take 1 tablet (88 mcg total) by mouth daily before breakfast. For low thyroid function (Patient taking differently: Take 100 mcg by mouth daily before breakfast. For low thyroid function)  . LORazepam (ATIVAN) 1 MG tablet Take 1 tablet (1 mg total) by mouth 2 (two) times daily as needed for anxiety.  . methylphenidate (RITALIN) 5 MG  tablet Take 1 tablet (5 mg total) by mouth daily.  Marland Kitchen rOPINIRole (REQUIP) 0.5 MG tablet Take 0.5 mg by mouth at bedtime.  . valsartan-hydrochlorothiazide (DIOVAN-HCT) 320-25 MG per tablet Take 1 tablet by mouth daily. High blood pressure control   No facility-administered encounter medications on file as of 05/11/2016.     Past Psychiatric History/Hospitalization(s): Anxiety: Yes Bipolar Disorder: Yes Depression: Yes Mania: Yes Psychosis: No Schizophrenia: No Personality Disorder: No Hospitalization for psychiatric illness: Yes History of Electroconvulsive Shock Therapy: No Prior Suicide Attempts: No  Physical Exam: Constitutional:  BP 120/76   Pulse 67   Ht 5\' 5"  (1.651 m)   Wt 210 lb 12.8 oz (95.6 kg)   BMI 35.08 kg/m   General Appearance: alert, oriented, no acute distress  Musculoskeletal: Strength & Muscle Tone: within normal limits Gait & Station: normal  Patient leans: straight  Mental Status Examination/Evaluation: reviewed MSE on 05/11/16 and same as previous visits except as noted  Objective: Attitude: Calm and cooperative  Appearance: Casual, appears to be stated age  Eye Contact::  Fair  Speech:  Clear and Coherent and Normal Rate  Volume:  Normal  Mood:  Depressed and anxious  Affect:  Depressed and Tearful  Thought Process:  Intact, Linear and Logical  Orientation:  Full (Time, Place, and Person)  Thought Content:  Negative  Suicidal Thoughts:  No endorsing passive thoughts of death  Homicidal Thoughts:  No  Judgement:  Fair  Insight:  Fair  Concentration: good  Memory: Immediate-intact Recent-intact Remote-intact  Recall: fair  Language: fair  Gait and Station: normal  [employer] of Knowledge: average  Psychomotor Activity:  Normal  Akathisia:  No  Handed:  Right  AIMS (if indicated):  n/a  Assets: desire for improvement Clinical biochemist Social support      reviewed A&P on 05/11/16 and same as previous visits  except as noted   Assessment: Axis I: Bipolar d/o- current episode depressed;  PTSD; GAD Axis II: deferred   Plan:  Ativan 1mg  BID prn anxiety Lamictal 200mg  po qD (Pt was only taking 150mg  previously) for mood stabalization Continue Wellbutrin XL 300mg  po qD for depression Increase Prozac 60mg  po qD for depression and anxiety and PTSD Increase Ritalin 10mg  po qD for mood augmentation Pt doesn't want meds changed today.   Medication management with supportive therapy. Risks/benefits and SE of the medication discussed. Pt verbalized understanding and verbal consent obtained for treatment.  Affirm with the patient that the medications are taken as ordered. Patient expressed understanding of how their medications were to be used.  -worsening of symptoms  Recommended pt use light box or sitting in direct sunlight for 1-2 hrs/day  Reviewed labs from 02/07/2014: CBC WNL, CMP WNL,     Therapy: brief supportive therapy provided. Discussed psychosocial stressors in detail.  Encouraged to continue individual therapy with Anderson Malta.   Pt denies SI and is at an acute low risk for suicide.Patient told to call clinic if any problems occur. Patient advised to go to ER if they should develop SI/HI, side effects, or if symptoms worsen. Has crisis numbers to call if needed. Pt verbalized understanding.  F/up in 6-8 weeks or sooner if needed  [name], MD 05/11/2016

## 2016-05-11 NOTE — Progress Notes (Signed)
 05/11/2016 [name]   06/15/1945  [id number]  Primary Physician BARNES,ELIZABETH STEWART, MD Primary Cardiologist: Dr. Turner    Reason for Visit/CC: Exertional Dyspnea and Abnormal Coronary CTA  HPI:  The patient is a 70 y/o female, followed by Dr. Turner of PVC's, diastolic dysfunction, PAT s/p ablation and HTN. Also with h/o bipolar disorder and sever depression and anxiety, per patient report.   She recently underwent w/u for exertional dyspnea. Dr. Turner ordered for her to undergo a coronary CTA and 2D echo. Pt completed order for her CT scan but failed to get her echo done. Her coronary CTA was performed 02/25/16. This was an abnormal study, showing Coronary calcium score of 364. This was 79 percentile for age and sex matched control. Normal coronary origin with right dominance, one vessel CAD with moderate disease in the ostial/proximal LAD and moderate, possibly severe stenosis in the mid LAD. The results were reviewed by Dr. Turner who recommended proceeding with a LHC with FFR testing of the LAD.  She presents to clinic today to discuss cath. She understands the risk.   Current Meds  Medication Sig  . aspirin 81 MG EC tablet Take 1 tablet (81 mg total) by mouth daily. For heart health  . buPROPion (WELLBUTRIN XL) 300 MG 24 hr tablet TAKE 1 TABLET (300 MG TOTAL) BY MOUTH DAILY. FOR DEPRESSION  . BYSTOLIC 2.5 MG tablet TAKE 1 TABLET BY MOUTH DAILY  . FLUoxetine HCl 60 MG TABS Take 60 mg by mouth daily.  . fluticasone (FLONASE) 50 MCG/ACT nasal spray Place 1 spray into both nostrils as needed for allergies. Sinus problems  . lamoTRIgine (LAMICTAL) 200 MG tablet Take 1 tablet (200 mg total) by mouth daily.  . levothyroxine (SYNTHROID, LEVOTHROID) 88 MCG tablet Take 1 tablet (88 mcg total) by mouth daily before breakfast. For low thyroid function  . LORazepam (ATIVAN) 1 MG tablet Take 1 tablet (1 mg total) by mouth 2 (two) times daily as needed for anxiety.  .  methylphenidate (RITALIN) 10 MG tablet Take 1 tablet (10 mg total) by mouth daily.  . rOPINIRole (REQUIP) 0.5 MG tablet Take 0.5 mg by mouth at bedtime.  . valsartan-hydrochlorothiazide (DIOVAN-HCT) 320-25 MG per tablet Take 1 tablet by mouth daily. High blood pressure control   Allergies  Allergen Reactions  . Geodon [Ziprasidone Hydrochloride] Other (See Comments)    Extremely aggitated  . Lithium Nausea Only and Other (See Comments)    Off balance, increased heart rate  . Talwin [Pentazocine] Other (See Comments)    Chest pain  . Toradol [Ketorolac Tromethamine] Other (See Comments)    hallucinations  . Abilify [Aripiprazole] Other (See Comments)    jerking  . Compazine [Prochlorperazine Edisylate] Nausea And Vomiting  . Diflucan [Fluconazole] Nausea Only   Past Medical History:  Diagnosis Date  . Abscess of Bartholin's gland   . Acute vestibular neuronitis    Dr Newman  . ADHD (attention deficit hyperactivity disorder)   . Adverse effect of general anesthetic    felt paralyzed while receiving anesthesia  . Anxiety   . Bipolar 1 disorder ([facility])   . Chronic kidney disease    kidney cancer- pt states she has elected to not have it treated.  . Depression    Bipolar disorder/goes to [facility] for meds  . Difficult intubation    told by [facility] that she was hard to intubate 15b yrs ago in [place]- surgery since then no problems  . Dysrhythmia      h/o atrial tachycardia- s/p ablation   . GERD (gastroesophageal reflux disease)   . H/O echocardiogram 07/2012   Normla LVF w grade I siastolic dysfunction   . History of attention deficit disorder   . History of endometriosis   . History of pleural effusion   . Hypertension   . 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 Migraines   . Obesity   . OSA (obstructive sleep apnea)    uses oral appliance instead of CPAP  . PONV (postoperative nausea and vomiting)   . PTSD (post-traumatic stress disorder)     . PVC's (premature ventricular contractions)   . Renal mass 02/15/2012  . RLS (restless legs syndrome)    Dr Clance  . rt renal ca dx'd 12/2009   no treatment/ no surg  . Vertigo    Family History  Problem Relation Age of Onset  . Allergies Father   . Skin cancer Father   . Bipolar disorder Father   . Alcohol abuse Father   . Heart disease Mother   . Depression Mother   . Hypertension Mother   . CAD Mother   . Colon cancer Paternal Grandmother   . OCD Paternal Grandmother   . Allergies Sister     multiple  . Allergies Brother     multiple  . Allergies Daughter   . Heart disease Maternal Grandfather   . Cancer Paternal Grandfather    Past Surgical History:  Procedure Laterality Date  . CARDIAC ELECTROPHYSIOLOGY MAPPING AND ABLATION  2000s  . nasoseptal reconstruction  1990s  . THYROIDECTOMY  1990  . TONSILLECTOMY     as a child  . TUBAL LIGATION  1970s  . uterine mass removal  03/2012   was found to be benign   Social History   Social History  . Marital status: Single    Spouse name: N/A  . Number of children: N/A  . Years of education: N/A   Occupational History  . 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 Smoking status: Never Smoker  . Smokeless tobacco: Never Used  . Alcohol use No  . Drug use: No  . Sexual activity: Yes    Birth control/ protection: None   Other Topics Concern  . Not on file   Social History Narrative   Divorced and lives alone   Has children     Review of Systems: General: negative for chills, fever, night sweats or weight changes.  Cardiovascular: negative for chest pain, dyspnea on exertion, edema, orthopnea, palpitations, paroxysmal nocturnal dyspnea or shortness of breath Dermatological: negative for rash Respiratory: negative for cough or wheezing Urologic: negative for hematuria Abdominal: negative for nausea, vomiting, diarrhea, bright red blood per rectum, melena, or  hematemesis Neurologic: negative for visual changes, syncope, or dizziness All other systems reviewed and are otherwise negative except as noted above.   Physical Exam:  Blood pressure 102/62, pulse 83, height 5' 5" (1.651 m), weight 212 lb 1.9 oz (96.2 kg).  General appearance: alert, cooperative and no distress Neck: no carotid bruit and no JVD Lungs: clear to auscultation bilaterally Heart: regular rate and rhythm, S1, S2 normal, no murmur, click, rub or gallop Extremities: extremities normal, atraumatic, no cyanosis or edema Pulses: 2+ and symmetric Skin: Skin color, texture, turgor normal. No rashes or lesions Neurologic: Grossly normal  EKG not performed   ASSESSMENT AND PLAN:   1. Exertional Dyspnea + Abnormal Coronary CTA: Coronary   calcium score of 364. This was 79 percentile for age and sex matched control. Normal coronary origin with right dominance, one vessel CAD with moderate disease in the ostial/proximal LAD and moderate, possibly severe stenosis in the mid LAD. Definitive LHC with FFR testing of LAD recommended, per Dr. Turner.  I have outlined potential associated risk including risk of death, MI, need for emergency surgery, stroke, nephrotoxicity, bleeding, vascular injury and allergic reaction. She understands these risk and agrees to proceed. We will arrange outpatient LCH at [facility].     Tahani Potier PA-C 05/11/2016 4:30 PM  

## 2016-05-11 NOTE — Patient Instructions (Addendum)
Medication Instructions:   Your physician recommends that you continue on your current medications as directed. Please refer to the Current Medication list given to you today.   If you need a refill on your cardiac medications before your next appointment, please call your pharmacy.  Labwork: CBC AND BMET TODAY    Testing/Procedures:  SEE LETTER FOR CATH    Follow-Up: WILL BE DETERMINED AFTER CATH    Any Other Special Instructions Will Be Listed Below (If Applicable).

## 2016-05-11 NOTE — Progress Notes (Signed)
   THERAPIST PROGRESS NOTE  Session Time: 2:35-3:30  Participation Level:Active  Behavioral Response:CasualAlertDepressed  Type of Therapy: Individual Therapy  Treatment Goals addressed: Coping  Interventions:strength-based; solution focused  Summary: WINDY COS a 71 y.o.femalewho presents with major depressive disorder.   Suicidal/Homicidal:Nowithout intent/plan  Therapist Response:Pt. Continues to present with a depressed mood. Pt. Discussed frustration with medication which  She does not believe are working anymore. Counselor discussed with there option for genetic testing and encouraged her discuss during her next appointment with her psychiatrist. Pt. Discussed that she continues to feel overwhelmed by caregiving responsibilities of significant other who has chronic mental illness and also has been very sick. Pt. Discussed that she has not been able to fully engage in self-care I.e., sleep, cleaning her apartment, preparing meals because of caregiving responsibilities. Counselor reviewed importance of boundaries in her relationship and seeking support for caregiving. Counselor also discussed housing options of visiting her significant other instead of him staying at her house. Pt. Discussed that she has more space in his home, but does not feel safe there. Counselor worked with Pt. To help develop perspective regarding his current physical illness that will improve and to return to prior pattern of schedule of time in his home and her home to avoid feeling overwhelmed.   Plan: Pt. To continue with solution-focused based therapy. Return again in 2weeks.  Diagnosis:Axis I:Depressive Disorder NOS  Axis II:No diagnosis   Nancie Neas, [facility] 05/11/2016

## 2016-05-12 LAB — BASIC METABOLIC PANEL
BUN/Creatinine Ratio: 23 (ref 12–28)
BUN: 21 mg/dL (ref 8–27)
CO2: 26 mmol/L (ref 18–29)
Calcium: 9.3 mg/dL (ref 8.7–10.3)
Chloride: 102 mmol/L (ref 96–106)
Creatinine, Ser: 0.92 mg/dL (ref 0.57–1.00)
GFR calc Af Amer: 72 (ref >59)
GFR calc non Af Amer: 63 (ref >59)
Glucose: 87 mg/dL (ref 65–99)
Potassium: 4.1 mmol/L (ref 3.5–5.2)
Sodium: 144 mmol/L (ref 134–144)

## 2016-05-12 LAB — CBC
Hematocrit: 39.5 % (ref 34.0–46.6)
Hemoglobin: 12.9 g/dL (ref 11.1–15.9)
MCH: 27.7 pg (ref 26.6–33.0)
MCHC: 32.7 g/dL (ref 31.5–35.7)
MCV: 85 fL (ref 79–97)
Platelets: 242 10*3/uL (ref 150–379)
RBC: 4.66 x10E6/uL (ref 3.77–5.28)
RDW: 15 % (ref 12.3–15.4)
WBC: 5.5 10*3/uL (ref 3.4–10.8)

## 2016-05-18 ENCOUNTER — Telehealth: Payer: Self-pay | Admitting: Cardiology

## 2016-05-18 NOTE — Telephone Encounter (Signed)
Pt requests to cancel cath scheduled for tomorrow, she has bipolar disorder and is very depressed right now, states she will call back when she is ready to reschedule.

## 2016-05-18 NOTE — Telephone Encounter (Signed)
New Message:   Pt needs to cancel Cath for tomorrow. She says she is very depressed,she will call later to reschedule.

## 2016-05-18 NOTE — Telephone Encounter (Signed)
I have cancelled cath for tomorrow (spoke with Santiago Glad) and will forward to Dr Radford Pax to let her know pt has cancelled cath and will call back to reschedule.

## 2016-05-18 NOTE — Telephone Encounter (Signed)
Please speak with her again.  I would prefer for her to move forward with cath so we can make sure there is not a high grade stenosis in her coronary artery.

## 2016-05-19 ENCOUNTER — Ambulatory Visit (HOSPITAL_COMMUNITY): Admission: RE | Admit: 2016-05-19 | Payer: Medicare Other | Source: Ambulatory Visit | Admitting: Cardiology

## 2016-05-19 ENCOUNTER — Encounter (HOSPITAL_COMMUNITY): Admission: RE | Payer: Self-pay | Source: Ambulatory Visit

## 2016-05-19 SURGERY — LEFT HEART CATH AND CORONARY ANGIOGRAPHY
Anesthesia: LOCAL

## 2016-05-20 NOTE — Telephone Encounter (Signed)
Patient understands need for catheterization, but refused to schedule at this time. She states she has been severely depressed and her bipolar medications have been changed. She knows she will not be able to handle the procedure if she is depressed. She will know in the next couple weeks if her medications are working and she will be able to schedule the procedure. She will call to update in a week or two.

## 2016-05-27 ENCOUNTER — Ambulatory Visit (HOSPITAL_COMMUNITY): Payer: Self-pay | Admitting: Psychiatry

## 2016-05-31 ENCOUNTER — Ambulatory Visit (HOSPITAL_COMMUNITY): Payer: Self-pay | Admitting: Psychiatry

## 2016-06-01 ENCOUNTER — Telehealth: Payer: Self-pay | Admitting: Pulmonary Disease

## 2016-06-01 ENCOUNTER — Other Ambulatory Visit: Payer: Self-pay | Admitting: Pulmonary Disease

## 2016-06-01 NOTE — Telephone Encounter (Signed)
Dr. Elsworth Soho, are you ok with refilling requip for the patient? You have never prescribed this for her before. Last RX came from Dr. Gwenette Greet. Thanks!

## 2016-06-01 NOTE — Telephone Encounter (Signed)
lmtcb x1 for pt. 

## 2016-06-01 NOTE — Telephone Encounter (Signed)
Okay to refill. Ensure 6 monthly follow-up with TP/ me

## 2016-06-02 NOTE — Telephone Encounter (Signed)
Pl see earlier phone note  OK to refill

## 2016-06-02 NOTE — Telephone Encounter (Signed)
Called and spoke to pt. Pt requesting refill for Requip 0.5mg . Last filled on 9.29.17 for #90 with 1 additional refill, prescribed by RA.  Pt last seen on 11.14.17 by TP.   Dr. Elsworth Soho, please advise if ok to refill. Thanks.

## 2016-06-03 NOTE — Telephone Encounter (Signed)
lmomtcb x1 

## 2016-06-04 NOTE — Telephone Encounter (Signed)
Pt aware that Requip is okay to refill.  This has been sent to [facility] per patient request on 06/03/16.  Nothing further needed.

## 2016-06-04 NOTE — Telephone Encounter (Signed)
lmomtcb x 2  

## 2016-06-07 ENCOUNTER — Encounter: Payer: Self-pay | Admitting: Cardiology

## 2016-06-07 ENCOUNTER — Other Ambulatory Visit: Payer: Self-pay | Admitting: Cardiology

## 2016-06-07 ENCOUNTER — Ambulatory Visit (INDEPENDENT_AMBULATORY_CARE_PROVIDER_SITE_OTHER): Payer: 59 | Admitting: Psychiatry

## 2016-06-07 DIAGNOSIS — I2510 Atherosclerotic heart disease of native coronary artery without angina pectoris: Secondary | ICD-10-CM

## 2016-06-07 DIAGNOSIS — F3130 Bipolar disorder, current episode depressed, mild or moderate severity, unspecified: Secondary | ICD-10-CM | POA: Diagnosis not present

## 2016-06-07 DIAGNOSIS — I2584 Coronary atherosclerosis due to calcified coronary lesion: Principal | ICD-10-CM

## 2016-06-07 NOTE — Telephone Encounter (Signed)
Returned patient's call. Left message to call back.

## 2016-06-07 NOTE — Telephone Encounter (Signed)
Patient calling about cath scheduled for tomorrow, has some questions. Please call, thanks.

## 2016-06-07 NOTE — Telephone Encounter (Signed)
Confirmed with patient she is to be at the [facility] tomorrow at [phone] for her catheterization with Dr. Tamala Julian. She understands she will have labs drawn upon arrival.  She still has instruction letter from previously scheduled catheterization - reviewed instructions and she has no further questions at this time.   To Dr. Radford Pax for cath orders.

## 2016-06-07 NOTE — Telephone Encounter (Signed)
Late entry: Spoke with patient last Thursday afternoon. She states she is willing to schedule cath Tuesday, 3/20. Informed her cath will be scheduled and she will be called with instructions.

## 2016-06-07 NOTE — Telephone Encounter (Signed)
Left message to call back  

## 2016-06-07 NOTE — Telephone Encounter (Signed)
This encounter was created in error - please disregard.

## 2016-06-07 NOTE — Telephone Encounter (Signed)
Scheduled patient for L heart catheterization with FFR of LAD tomorrow at noon with Dr. Tamala Julian.  Called patient to confirm time/date and review instructions. Left message to call back.

## 2016-06-08 ENCOUNTER — Ambulatory Visit (HOSPITAL_COMMUNITY)
Admission: RE | Admit: 2016-06-08 | Discharge: 2016-06-08 | Disposition: A | Discharge status: Home / Self Care | Payer: Medicare Other | Source: Ambulatory Visit | Attending: Interventional Cardiology | Admitting: Interventional Cardiology

## 2016-06-08 ENCOUNTER — Encounter (HOSPITAL_COMMUNITY): Payer: Self-pay | Admitting: *Deleted

## 2016-06-08 ENCOUNTER — Encounter (HOSPITAL_COMMUNITY)
Admission: RE | Disposition: A | Discharge status: Home / Self Care | Payer: Self-pay | Source: Ambulatory Visit | Attending: Interventional Cardiology

## 2016-06-08 DIAGNOSIS — K219 Gastro-esophageal reflux disease without esophagitis: Secondary | ICD-10-CM | POA: Diagnosis not present

## 2016-06-08 DIAGNOSIS — I2584 Coronary atherosclerosis due to calcified coronary lesion: Secondary | ICD-10-CM

## 2016-06-08 DIAGNOSIS — Z85528 Personal history of other malignant neoplasm of kidney: Secondary | ICD-10-CM | POA: Diagnosis not present

## 2016-06-08 DIAGNOSIS — E039 Hypothyroidism, unspecified: Secondary | ICD-10-CM | POA: Diagnosis not present

## 2016-06-08 DIAGNOSIS — F909 Attention-deficit hyperactivity disorder, unspecified type: Secondary | ICD-10-CM | POA: Insufficient documentation

## 2016-06-08 DIAGNOSIS — I2510 Atherosclerotic heart disease of native coronary artery without angina pectoris: Secondary | ICD-10-CM

## 2016-06-08 DIAGNOSIS — I519 Heart disease, unspecified: Secondary | ICD-10-CM | POA: Diagnosis present

## 2016-06-08 DIAGNOSIS — Z7951 Long term (current) use of inhaled steroids: Secondary | ICD-10-CM | POA: Diagnosis not present

## 2016-06-08 DIAGNOSIS — Z7982 Long term (current) use of aspirin: Secondary | ICD-10-CM | POA: Insufficient documentation

## 2016-06-08 DIAGNOSIS — N189 Chronic kidney disease, unspecified: Secondary | ICD-10-CM | POA: Insufficient documentation

## 2016-06-08 DIAGNOSIS — I129 Hypertensive chronic kidney disease with stage 1 through stage 4 chronic kidney disease, or unspecified chronic kidney disease: Secondary | ICD-10-CM | POA: Diagnosis not present

## 2016-06-08 DIAGNOSIS — G4733 Obstructive sleep apnea (adult) (pediatric): Secondary | ICD-10-CM | POA: Insufficient documentation

## 2016-06-08 DIAGNOSIS — G2581 Restless legs syndrome: Secondary | ICD-10-CM | POA: Insufficient documentation

## 2016-06-08 DIAGNOSIS — I493 Ventricular premature depolarization: Secondary | ICD-10-CM | POA: Diagnosis present

## 2016-06-08 DIAGNOSIS — R0609 Other forms of dyspnea: Secondary | ICD-10-CM | POA: Insufficient documentation

## 2016-06-08 DIAGNOSIS — F319 Bipolar disorder, unspecified: Secondary | ICD-10-CM | POA: Insufficient documentation

## 2016-06-08 DIAGNOSIS — Z6834 Body mass index (BMI) 34.0-34.9, adult: Secondary | ICD-10-CM | POA: Diagnosis not present

## 2016-06-08 DIAGNOSIS — Z8249 Family history of ischemic heart disease and other diseases of the circulatory system: Secondary | ICD-10-CM | POA: Insufficient documentation

## 2016-06-08 DIAGNOSIS — F4310 Post-traumatic stress disorder, unspecified: Secondary | ICD-10-CM | POA: Diagnosis not present

## 2016-06-08 DIAGNOSIS — E669 Obesity, unspecified: Secondary | ICD-10-CM | POA: Diagnosis not present

## 2016-06-08 DIAGNOSIS — F3130 Bipolar disorder, current episode depressed, mild or moderate severity, unspecified: Secondary | ICD-10-CM | POA: Diagnosis present

## 2016-06-08 HISTORY — PX: LEFT HEART CATH AND CORONARY ANGIOGRAPHY: CATH118249

## 2016-06-08 LAB — BASIC METABOLIC PANEL
Anion gap: 12 (ref 5–15)
BUN: 17 mg/dL (ref 6–20)
CO2: 26 mmol/L (ref 22–32)
Calcium: 9.8 mg/dL (ref 8.9–10.3)
Chloride: 101 mmol/L (ref 101–111)
Creatinine, Ser: 1.03 mg/dL — ABNORMAL HIGH (ref 0.44–1.00)
GFR calc Af Amer: >60 mL/min (ref >60)
GFR calc non Af Amer: 53 mL/min — ABNORMAL LOW (ref >60)
Glucose, Bld: 80 mg/dL (ref 65–99)
Potassium: 4.1 mmol/L (ref 3.5–5.1)
Sodium: 139 mmol/L (ref 135–145)

## 2016-06-08 LAB — PROTIME-INR
INR: 0.96
Prothrombin Time: 12.8 seconds (ref 11.4–15.2)

## 2016-06-08 LAB — CBC
HCT: 38.6 % (ref 36.0–46.0)
Hemoglobin: 12.5 g/dL (ref 12.0–15.0)
MCH: 27.7 pg (ref 26.0–34.0)
MCHC: 32.4 g/dL (ref 30.0–36.0)
MCV: 85.6 fL (ref 78.0–100.0)
Platelets: 217 10*3/uL (ref 150–400)
RBC: 4.51 MIL/uL (ref 3.87–5.11)
RDW: 15 % (ref 11.5–15.5)
WBC: 4 10*3/uL (ref 4.0–10.5)

## 2016-06-08 SURGERY — LEFT HEART CATH AND CORONARY ANGIOGRAPHY
Anesthesia: LOCAL

## 2016-06-08 MED ORDER — ACETAMINOPHEN 325 MG PO TABS: 650.0000 mg | ORAL_TABLET | ORAL | Status: DC | PRN

## 2016-06-08 MED ORDER — SODIUM CHLORIDE 0.9% FLUSH: 3.0000 mL | Freq: Two times a day (BID) | INTRAVENOUS | Status: DC

## 2016-06-08 MED ORDER — LIDOCAINE HCL (PF) 1 % IJ SOLN
INTRAMUSCULAR | Status: DC | PRN
  Administered 2016-06-08: 5 mL

## 2016-06-08 MED ORDER — ONDANSETRON HCL 4 MG/2ML IJ SOLN: 4.0000 mg | Freq: Four times a day (QID) | INTRAMUSCULAR | Status: DC | PRN

## 2016-06-08 MED ORDER — LIDOCAINE HCL (PF) 1 % IJ SOLN
INTRAMUSCULAR | Status: AC
  Filled 2016-06-08: qty 30

## 2016-06-08 MED ORDER — VERAPAMIL HCL 2.5 MG/ML IV SOLN
INTRAVENOUS | Status: AC
  Filled 2016-06-08: qty 2

## 2016-06-08 MED ORDER — FENTANYL CITRATE (PF) 100 MCG/2ML IJ SOLN
INTRAMUSCULAR | Status: DC | PRN
  Administered 2016-06-08: 50 ug via INTRAVENOUS

## 2016-06-08 MED ORDER — IOPAMIDOL (ISOVUE-370) INJECTION 76%
INTRAVENOUS | Status: AC
  Filled 2016-06-08: qty 50

## 2016-06-08 MED ORDER — IOPAMIDOL (ISOVUE-370) INJECTION 76%
INTRAVENOUS | Status: AC
  Filled 2016-06-08: qty 100

## 2016-06-08 MED ORDER — SODIUM CHLORIDE 0.9 % IV SOLN: 250.0000 mL | INTRAVENOUS | Status: DC | PRN

## 2016-06-08 MED ORDER — SODIUM CHLORIDE 0.9% FLUSH: 3.0000 mL | INTRAVENOUS | Status: DC | PRN

## 2016-06-08 MED ORDER — ASPIRIN 81 MG PO CHEW
CHEWABLE_TABLET | ORAL | Status: DC
Start: 2016-06-08 — End: 2016-06-08
  Filled 2016-06-08: qty 1

## 2016-06-08 MED ORDER — HEPARIN SODIUM (PORCINE) 1000 UNIT/ML IJ SOLN
INTRAMUSCULAR | Status: DC | PRN
  Administered 2016-06-08: 5000 [IU] via INTRAVENOUS

## 2016-06-08 MED ORDER — HEPARIN (PORCINE) IN NACL 2-0.9 UNIT/ML-% IJ SOLN
INTRAMUSCULAR | Status: AC
  Filled 2016-06-08: qty 1000

## 2016-06-08 MED ORDER — FENTANYL CITRATE (PF) 100 MCG/2ML IJ SOLN
INTRAMUSCULAR | Status: AC
  Filled 2016-06-08: qty 2

## 2016-06-08 MED ORDER — HEPARIN (PORCINE) IN NACL 2-0.9 UNIT/ML-% IJ SOLN
INTRAMUSCULAR | Status: DC | PRN
  Administered 2016-06-08: 1000 mL

## 2016-06-08 MED ORDER — MIDAZOLAM HCL 2 MG/2ML IJ SOLN
INTRAMUSCULAR | Status: AC
  Filled 2016-06-08: qty 2

## 2016-06-08 MED ORDER — ASPIRIN 81 MG PO CHEW: 81.0000 mg | CHEWABLE_TABLET | ORAL | Status: DC

## 2016-06-08 MED ORDER — SODIUM CHLORIDE 0.9 % WEIGHT BASED INFUSION
3.0000 mL/kg/h | INTRAVENOUS | Status: DC
  Administered 2016-06-08: 3 mL/kg/h via INTRAVENOUS

## 2016-06-08 MED ORDER — HEPARIN SODIUM (PORCINE) 1000 UNIT/ML IJ SOLN
INTRAMUSCULAR | Status: AC
  Filled 2016-06-08: qty 1

## 2016-06-08 MED ORDER — VERAPAMIL HCL 2.5 MG/ML IV SOLN
INTRAVENOUS | Status: DC | PRN
  Administered 2016-06-08: 14:00:00 via INTRA_ARTERIAL

## 2016-06-08 MED ORDER — IOPAMIDOL (ISOVUE-370) INJECTION 76%
INTRAVENOUS | Status: DC | PRN
  Administered 2016-06-08: 145 mL via INTRA_ARTERIAL

## 2016-06-08 MED ORDER — SODIUM CHLORIDE 0.9 % IV SOLN: INTRAVENOUS | Status: DC

## 2016-06-08 MED ORDER — OXYCODONE-ACETAMINOPHEN 5-325 MG PO TABS: 1.0000 | ORAL_TABLET | ORAL | Status: DC | PRN

## 2016-06-08 MED ORDER — SODIUM CHLORIDE 0.9 % WEIGHT BASED INFUSION: 1.0000 mL/kg/h | INTRAVENOUS | Status: DC

## 2016-06-08 MED ORDER — MIDAZOLAM HCL 2 MG/2ML IJ SOLN
INTRAMUSCULAR | Status: DC | PRN
  Administered 2016-06-08: 1 mg via INTRAVENOUS

## 2016-06-08 SURGICAL SUPPLY — 14 items
CATH INFINITI 5 FR JL3.5 (CATHETERS) ×1 IMPLANT
CATH INFINITI JR4 5F (CATHETERS) ×1 IMPLANT
CATH LAUNCHER 5F EBU3.0 (CATHETERS) IMPLANT
CATHETER LAUNCHER 5F EBU3.0 (CATHETERS) ×2
COVER PRB 48X5XTLSCP FOLD TPE (BAG) ×1 IMPLANT
COVER PROBE 5X48 (BAG) ×2
DEVICE RAD COMP TR BAND LRG (VASCULAR PRODUCTS) ×1 IMPLANT
GLIDESHEATH SLEND A-KIT 6F 22G (SHEATH) ×1 IMPLANT
GUIDEWIRE INQWIRE 1.5J.035X260 (WIRE) IMPLANT
INQWIRE 1.5J .035X260CM (WIRE) ×2
KIT HEART LEFT (KITS) ×2 IMPLANT
PACK CARDIAC CATHETERIZATION (CUSTOM PROCEDURE TRAY) ×2 IMPLANT
TRANSDUCER W/STOPCOCK (MISCELLANEOUS) ×2 IMPLANT
TUBING CIL FLEX 10 FLL-RA (TUBING) ×2 IMPLANT

## 2016-06-08 NOTE — H&P (View-Only) (Signed)
05/11/2016 [name]   1945/05/07  [id number]  Primary Physician Gerrit Heck, MD Primary Cardiologist: Dr. Radford Pax    Reason for Visit/CC: Exertional Dyspnea and Abnormal Coronary CTA  HPI:  The patient is a 71 y/o female, followed by Dr. Radford Pax of PVC's, diastolic dysfunction, PAT s/p ablation and HTN. Also with h/o bipolar disorder and sever depression and anxiety, per patient report.   She recently underwent w/u for exertional dyspnea. Dr. Radford Pax ordered for her to undergo a coronary CTA and 2D echo. Pt completed order for her CT scan but failed to get her echo done. Her coronary CTA was performed 02/25/16. This was an abnormal study, showing Coronary calcium score of 364. This was 45 percentile for age and sex matched control. Normal coronary origin with right dominance, one vessel CAD with moderate disease in the ostial/proximal LAD and moderate, possibly severe stenosis in the mid LAD. The results were reviewed by Dr. Radford Pax who recommended proceeding with a LHC with FFR testing of the LAD.  She presents to clinic today to discuss cath. She understands the risk.   Current Meds  Medication Sig  . aspirin 81 MG EC tablet Take 1 tablet (81 mg total) by mouth daily. For heart health  . buPROPion (WELLBUTRIN XL) 300 MG 24 hr tablet TAKE 1 TABLET (300 MG TOTAL) BY MOUTH DAILY. FOR DEPRESSION  . BYSTOLIC 2.5 MG tablet TAKE 1 TABLET BY MOUTH DAILY  . FLUoxetine HCl 60 MG TABS Take 60 mg by mouth daily.  . fluticasone (FLONASE) 50 MCG/ACT nasal spray Place 1 spray into both nostrils as needed for allergies. Sinus problems  . lamoTRIgine (LAMICTAL) 200 MG tablet Take 1 tablet (200 mg total) by mouth daily.  Marland Kitchen levothyroxine (SYNTHROID, LEVOTHROID) 88 MCG tablet Take 1 tablet (88 mcg total) by mouth daily before breakfast. For low thyroid function  . LORazepam (ATIVAN) 1 MG tablet Take 1 tablet (1 mg total) by mouth 2 (two) times daily as needed for anxiety.  .  methylphenidate (RITALIN) 10 MG tablet Take 1 tablet (10 mg total) by mouth daily.  Marland Kitchen rOPINIRole (REQUIP) 0.5 MG tablet Take 0.5 mg by mouth at bedtime.  . valsartan-hydrochlorothiazide (DIOVAN-HCT) 320-25 MG per tablet Take 1 tablet by mouth daily. High blood pressure control   Allergies  Allergen Reactions  . Geodon [Ziprasidone Hydrochloride] Other (See Comments)    Extremely aggitated  . Lithium Nausea Only and Other (See Comments)    Off balance, increased heart rate  . Talwin [Pentazocine] Other (See Comments)    Chest pain  . Toradol [Ketorolac Tromethamine] Other (See Comments)    hallucinations  . Abilify [Aripiprazole] Other (See Comments)    jerking  . Compazine [Prochlorperazine Edisylate] Nausea And Vomiting  . Diflucan [Fluconazole] Nausea Only   Past Medical History:  Diagnosis Date  . Abscess of Bartholin's gland   . Acute vestibular neuronitis    Dr Lucia Gaskins  . ADHD (attention deficit hyperactivity disorder)   . Adverse effect of general anesthetic    felt paralyzed while receiving anesthesia  . Anxiety   . Bipolar 1 disorder ([place])   . Chronic kidney disease    kidney cancer- pt states she has elected to not have it treated.  . Depression    Bipolar disorder/goes to [facility] meds  . Difficult intubation    told by [facility] that she was hard to intubate 15b yrs ago in [place]- surgery since then no problems  . Dysrhythmia  h/o atrial tachycardia- s/p ablation   . GERD (gastroesophageal reflux disease)   . H/O echocardiogram 07/2012   [place] LVF w grade I siastolic dysfunction   . History of attention deficit disorder   . History of endometriosis   . History of pleural effusion   . Hypertension   . 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 Migraines   . Obesity   . OSA (obstructive sleep apnea)    uses oral appliance instead of CPAP  . PONV (postoperative nausea and vomiting)   . PTSD (post-traumatic stress disorder)     . PVC's (premature ventricular contractions)   . Renal mass 02/15/2012  . RLS (restless legs syndrome)    Dr Gwenette Greet  . rt renal ca dx'd 12/2009   no treatment/ no surg  . Vertigo    Family History  Problem Relation Age of Onset  . Allergies Father   . Skin cancer Father   . Bipolar disorder Father   . Alcohol abuse Father   . Heart disease Mother   . Depression Mother   . Hypertension Mother   . CAD Mother   . Colon cancer Paternal Grandmother   . OCD Paternal Grandmother   . Allergies Sister     multiple  . Allergies Brother     multiple  . Allergies Daughter   . Heart disease Maternal Grandfather   . Cancer Paternal Grandfather    Past Surgical History:  Procedure Laterality Date  . CARDIAC ELECTROPHYSIOLOGY [place] AND ABLATION  2000s  . nasoseptal reconstruction  1990s  . THYROIDECTOMY  1990  . TONSILLECTOMY     as a child  . TUBAL LIGATION  1970s  . uterine mass removal  03/2012   was found to be benign   Social History   Social History  . Marital status: Single    Spouse name: N/A  . Number of children: N/A  . Years of education: N/A   Occupational History  . 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 Smoking status: Never Smoker  . Smokeless tobacco: Never Used  . Alcohol use No  . Drug use: No  . Sexual activity: Yes    Birth control/ protection: None   Other Topics Concern  . Not on file   Social History Narrative   Divorced and lives alone   Has children     Review of Systems: General: negative for chills, fever, night sweats or weight changes.  Cardiovascular: negative for chest pain, dyspnea on exertion, edema, orthopnea, palpitations, paroxysmal nocturnal dyspnea or shortness of breath Dermatological: negative for rash Respiratory: negative for cough or wheezing Urologic: negative for hematuria Abdominal: negative for nausea, vomiting, diarrhea, bright red blood per rectum, melena, or  hematemesis Neurologic: negative for visual changes, syncope, or dizziness All other systems reviewed and are otherwise negative except as noted above.   Physical Exam:  Blood pressure 102/62, pulse 83, height 5\' 5"  (1.651 m), weight 212 lb 1.9 oz (96.2 kg).  General appearance: alert, cooperative and no distress Neck: no carotid bruit and no JVD Lungs: clear to auscultation bilaterally Heart: regular rate and rhythm, S1, S2 normal, no murmur, click, rub or gallop Extremities: extremities normal, atraumatic, no cyanosis or edema Pulses: 2+ and symmetric Skin: Skin color, texture, turgor normal. No rashes or lesions Neurologic: Grossly normal  EKG not performed   ASSESSMENT AND PLAN:   1. Exertional Dyspnea + Abnormal Coronary CTA: Coronary  calcium score of 364. This was 103 percentile for age and sex matched control. Normal coronary origin with right dominance, one vessel CAD with moderate disease in the ostial/proximal LAD and moderate, possibly severe stenosis in the mid LAD. Definitive LHC with FFR testing of LAD recommended, per Dr. Radford Pax.  I have outlined potential associated risk including risk of death, MI, need for emergency surgery, stroke, nephrotoxicity, bleeding, vascular injury and allergic reaction. She understands these risk and agrees to proceed. We will arrange outpatient [facility] at [facility].     Lyda Jester PA-C 05/11/2016 4:30 PM

## 2016-06-08 NOTE — Discharge Instructions (Signed)
Radial Site Care °Refer to this sheet in the next few weeks. These instructions provide you with information about caring for yourself after your procedure. Your health care provider may also give you more specific instructions. Your treatment has been planned according to current medical practices, but problems sometimes occur. Call your health care provider if you have any problems or questions after your procedure. °What can I expect after the procedure? °After your procedure, it is typical to have the following: °· Bruising at the radial site that usually fades within 1-2 weeks. °· Blood collecting in the tissue (hematoma) that may be painful to the touch. It should usually decrease in size and tenderness within 1-2 weeks. °Follow these instructions at home: °· Take medicines only as directed by your health care provider. °· You may shower 24-48 hours after the procedure or as directed by your health care provider. Remove the bandage (dressing) and gently wash the site with plain soap and water. Pat the area dry with a clean towel. Do not rub the site, because this may cause bleeding. °· Do not take baths, swim, or use a hot tub until your health care provider approves. °· Check your insertion site every day for redness, swelling, or drainage. °· Do not apply powder or lotion to the site. °· Do not flex or bend the affected arm for 24 hours or as directed by your health care provider. °· Do not push or pull heavy objects with the affected arm for 24 hours or as directed by your health care provider. °· Do not lift over 10 lb (4.5 kg) for 5 days after your procedure or as directed by your health care provider. °· Ask your health care provider when it is okay to: °¨ Return to work or school. °¨ Resume usual physical activities or sports. °¨ Resume sexual activity. °· Do not drive home if you are discharged the same day as the procedure. Have someone else drive you. °· You may drive 24 hours after the procedure  unless otherwise instructed by your health care provider. °· Do not operate machinery or power tools for 24 hours after the procedure. °· If your procedure was done as an outpatient procedure, which means that you went home the same day as your procedure, a responsible adult should be with you for the first 24 hours after you arrive home. °· Keep all follow-up visits as directed by your health care provider. This is important. °Contact a health care provider if: °· You have a fever. °· You have chills. °· You have increased bleeding from the radial site. Hold pressure on the site. °Get help right away if: °· You have unusual pain at the radial site. °· You have redness, warmth, or swelling at the radial site. °· You have drainage (other than a small amount of blood on the dressing) from the radial site. °· The radial site is bleeding, and the bleeding does not stop after 30 minutes of holding steady pressure on the site. °· Your arm or hand becomes pale, cool, tingly, or numb. °This information is not intended to replace advice given to you by your health care provider. Make sure you discuss any questions you have with your health care provider. °Document Released: 04/10/2010 Document Revised: 08/14/2015 Document Reviewed: 09/24/2013 °Elsevier Interactive Patient Education © 2017 [employer] ° °

## 2016-06-08 NOTE — Interval H&P Note (Signed)
Cath Lab Visit (complete for each Cath Lab visit)  Clinical Evaluation Leading to the Procedure:   ACS: No.  Non-ACS:    Anginal Classification: CCS II  Anti-ischemic medical therapy: Minimal Therapy (1 class of medications)  Non-Invasive Test Results: No non-invasive testing performed  Prior CABG: No previous CABG      History and Physical Interval Note:  06/08/2016 2:04 PM  [name]  has presented today for surgery, with the diagnosis of abnormal ct  The various methods of treatment have been discussed with the patient and family. After consideration of risks, benefits and other options for treatment, the patient has consented to  Procedure(s): Left Heart Cath and Coronary Angiography (N/A) as a surgical intervention .  The patient's history has been reviewed, patient examined, no change in status, stable for surgery.  I have reviewed the patient's chart and labs.  Questions were answered to the patient's satisfaction.     Belva Crome III

## 2016-06-08 NOTE — Progress Notes (Signed)
   THERAPIST PROGRESS NOTE  Session Time: 3:20-4:00  Participation Level:Active  Behavioral Response:CasualAlertDepressed  Type of Therapy: Individual Therapy  Treatment Goals addressed: Coping  Interventions:strength-based; solution focused  Summary: [name] a 71 y.o.femalewho presents with major depressive disorder.   Suicidal/Homicidal:Nowithout intent/plan  Therapist Response:Pt. Presented with euthymic mood. Pt. Discussed plan for day surgery on tomorrow for heart. Pt. Discussed that she was calm and at peace about the procedure. Session focused on planning for tonight, return from surgery, and aftercare. Pt. Discussed that she will depend on her boyfriend and necessary boundaries so that her recovery is good with minimal stress. Pt. Discussed postponing organization of her home until after the procedure. Pt. Also discussed goal of travel and wanting to work on her anxiety related to travel in future sessions.   Plan: Pt. To continue with solution-focusedbased therapy. Return again in 2weeks.  Diagnosis:Axis I:Depressive Disorder NOS  Axis II:No diagnosis Nancie Neas, [facility] 06/08/2016

## 2016-06-09 ENCOUNTER — Encounter (HOSPITAL_COMMUNITY): Payer: Self-pay | Admitting: Interventional Cardiology

## 2016-06-09 ENCOUNTER — Encounter: Payer: Self-pay | Admitting: Cardiology

## 2016-06-09 ENCOUNTER — Telehealth: Payer: Self-pay

## 2016-06-09 DIAGNOSIS — I2510 Atherosclerotic heart disease of native coronary artery without angina pectoris: Secondary | ICD-10-CM

## 2016-06-09 MED ORDER — ATORVASTATIN CALCIUM 40 MG PO TABS: 40.0000 mg | ORAL_TABLET | Freq: Every day | ORAL | 11 refills | Status: DC

## 2016-06-09 NOTE — Telephone Encounter (Signed)
Called patient to review Dr. Theodosia Blender recommendations.  Left message to call back.

## 2016-06-09 NOTE — Telephone Encounter (Signed)
-----   Message from Sueanne Margarita, MD sent at 06/09/2016  9:23 AM EDT ----- Patient had cath done with moderate non obstructrive CAD.  Please start Lipitor 40mg  daily and get an FLP and ALT in 6 weeks.  Please have her check her BP and HR daily for 1 week and call with results.    Fransico Him, MD ----- Message ----- From: Belva Crome, MD Sent: 06/08/2016   6:33 PM To: Sueanne Margarita, MD  She has intermediate stenosis in the LAD and significant stenosis in a relatively small to moderate-sized first obtuse marginal. Her presentation was not acute coronary syndrome. She did not fit appropriate use criteria for intervention. Aggressive risk factor modification and anti-ischemic therapy should be used and if refractory symptoms then PCI could be considered.  The patient was not in favor of PCI at this time.  HS

## 2016-06-09 NOTE — Telephone Encounter (Signed)
Instructed patient to START LIPITOR 40 mg daily. FLP and ALT scheduled 5/18. Follow-up OV scheduled 4/5 with Dr. Radford Pax to discuss cath results. Patient was grateful for call and agrees with treatment plan.

## 2016-06-24 ENCOUNTER — Ambulatory Visit (INDEPENDENT_AMBULATORY_CARE_PROVIDER_SITE_OTHER): Payer: Medicare Other | Admitting: Cardiology

## 2016-06-24 ENCOUNTER — Ambulatory Visit (INDEPENDENT_AMBULATORY_CARE_PROVIDER_SITE_OTHER): Payer: 59 | Admitting: Psychiatry

## 2016-06-24 ENCOUNTER — Encounter (HOSPITAL_COMMUNITY): Payer: Self-pay | Admitting: Psychiatry

## 2016-06-24 ENCOUNTER — Encounter: Payer: Self-pay | Admitting: Cardiology

## 2016-06-24 VITALS — BP 110/68 | HR 59 | Ht 65.0 in | Wt 208.4 lb

## 2016-06-24 DIAGNOSIS — I471 Supraventricular tachycardia: Secondary | ICD-10-CM | POA: Diagnosis not present

## 2016-06-24 DIAGNOSIS — I10 Essential (primary) hypertension: Secondary | ICD-10-CM

## 2016-06-24 DIAGNOSIS — Z818 Family history of other mental and behavioral disorders: Secondary | ICD-10-CM | POA: Diagnosis not present

## 2016-06-24 DIAGNOSIS — Z811 Family history of alcohol abuse and dependence: Secondary | ICD-10-CM

## 2016-06-24 DIAGNOSIS — F411 Generalized anxiety disorder: Secondary | ICD-10-CM | POA: Diagnosis not present

## 2016-06-24 DIAGNOSIS — I2510 Atherosclerotic heart disease of native coronary artery without angina pectoris: Secondary | ICD-10-CM | POA: Diagnosis not present

## 2016-06-24 DIAGNOSIS — F4310 Post-traumatic stress disorder, unspecified: Secondary | ICD-10-CM

## 2016-06-24 DIAGNOSIS — F3130 Bipolar disorder, current episode depressed, mild or moderate severity, unspecified: Secondary | ICD-10-CM | POA: Diagnosis not present

## 2016-06-24 DIAGNOSIS — E785 Hyperlipidemia, unspecified: Secondary | ICD-10-CM | POA: Diagnosis not present

## 2016-06-24 DIAGNOSIS — Z79899 Other long term (current) drug therapy: Secondary | ICD-10-CM

## 2016-06-24 HISTORY — DX: Hyperlipidemia, unspecified: E78.5

## 2016-06-24 MED ORDER — LORAZEPAM 1 MG PO TABS: 1.0000 mg | ORAL_TABLET | Freq: Two times a day (BID) | ORAL | 1 refills | Status: DC | PRN

## 2016-06-24 MED ORDER — BUPROPION HCL ER (XL) 150 MG PO TB24: 450.0000 mg | ORAL_TABLET | Freq: Every day | ORAL | 1 refills | Status: DC

## 2016-06-24 MED ORDER — METHYLPHENIDATE HCL 10 MG PO TABS: 10.0000 mg | ORAL_TABLET | Freq: Every day | ORAL | 0 refills | Status: DC

## 2016-06-24 MED ORDER — LAMOTRIGINE 200 MG PO TABS: 200.0000 mg | ORAL_TABLET | Freq: Every day | ORAL | 1 refills | Status: DC

## 2016-06-24 MED ORDER — FLUOXETINE HCL 20 MG PO CAPS: 60.0000 mg | ORAL_CAPSULE | Freq: Every day | ORAL | 1 refills | Status: DC

## 2016-06-24 NOTE — Progress Notes (Signed)
Cardiology Office Note    Date:  06/24/2016   ID:  [name], DOB [birth year], MRN [id number]  PCP:  Gerrit Heck, MD  Cardiologist:  Fransico Him, MD   Chief Complaint  Patient presents with  . Coronary Artery Disease  . Hyperlipidemia  . Hypertension    History of Present Illness:  [name] is a 71 y.o. female with a history of PVC's, diastolic dysfunction, PAT s/p ablation and HTN.  She is here today for followup and is doing well.  When I last saw her she was complaining of DOE and chest pain and underwent coronary CTA which showed a calcium score of 364 and one vessel CAD with moderate disease of the ostial and prox LAD and moderate - severe stenosis of the mid LAD,  She subsequently underwent cath showing 50-70% mid LAD proximal to the D1 and 75-80% mid OM with luminal irregularities in the mid RCA with normal LVF.  Medical therapy was recommended first and if continues to have angina or ischemia in anterior wall then would consider PCI of the OM.  It was felt that the LAD would be higher risk due to significant calcification and angulation.  As this was not an ACS, PCI was not performed.   She is now back today for review and she denies any chest pain or pressure, PND, orthopnea, LE edema, palpitations, dizziness or syncope. She has had problems with her bipolar disorder recently and has been in bed due to depression so she has not exerted herself to know if she still has SOB.    Past Medical History:  Diagnosis Date  . Abscess of Bartholin's gland   . Acute vestibular neuronitis    Dr Lucia Gaskins  . ADHD (attention deficit hyperactivity disorder)   . Adverse effect of general anesthetic    felt paralyzed while receiving anesthesia  . Anxiety   . Bipolar 1 disorder ([place])   . CAD (coronary artery disease), native coronary artery    cath 05/2016 showing 50-70% stenosis in the mid LAD proximal to the first diagonal and 70-80% small OM1. FFR of LAD  not  performed because of difficulty with catheter control from the right radial.   . Chronic kidney disease    kidney cancer- pt states she has elected to not have it treated.  . Depression    Bipolar disorder/goes to [facility] for meds  . Difficult intubation    told by [facility] that she was hard to intubate 15b yrs ago in [place]- surgery since then no problems  . Dysrhythmia    h/o atrial tachycardia- s/p ablation   . GERD (gastroesophageal reflux disease)   . H/O echocardiogram 07/2012   Normla LVF w grade I siastolic dysfunction   . History of attention deficit disorder   . History of endometriosis   . History of pleural effusion   . Hyperlipidemia LDL goal <70 06/24/2016  . Hypertension   . 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 Migraines   . Obesity   . OSA (obstructive sleep apnea)    uses oral appliance instead of CPAP  . PONV (postoperative nausea and vomiting)   . PTSD (post-traumatic stress disorder)   . PVC's (premature ventricular contractions)   . Renal mass 02/15/2012  . RLS (restless legs syndrome)    Dr Gwenette Greet  . rt renal ca dx'd 12/2009   no treatment/ no surg  . Vertigo     Past Surgical  History:  Procedure Laterality Date  . CARDIAC ELECTROPHYSIOLOGY [place] AND ABLATION  2000s  . LEFT HEART CATH AND CORONARY ANGIOGRAPHY N/A 06/08/2016   Procedure: Left Heart Cath and Coronary Angiography;  Surgeon: Belva Crome, MD;  Location: [facility];  Service: Cardiovascular;  Laterality: N/A;  . nasoseptal reconstruction  1990s  . THYROIDECTOMY  1990  . TONSILLECTOMY     as a child  . TUBAL LIGATION  1970s  . uterine mass removal  03/2012   was found to be benign    Current Medications: Current Meds  Medication Sig  . aspirin 81 MG EC tablet Take 1 tablet (81 mg total) by mouth daily. For heart health  . atorvastatin (LIPITOR) 40 MG tablet Take 1 tablet (40 mg total) by mouth daily.  . beta carotene 25000 UNIT capsule Take  25,000 Units by mouth daily.  Marland Kitchen buPROPion (WELLBUTRIN XL) 150 MG 24 hr tablet Take 3 tablets (450 mg total) by mouth daily.  Marland Kitchen BYSTOLIC 2.5 MG tablet TAKE 1 TABLET BY MOUTH DAILY  . cholecalciferol (VITAMIN D) 1000 units tablet Take 2,000 Units by mouth daily.  Marland Kitchen dimenhyDRINATE (DRAMAMINE) 50 MG tablet Take 50 mg by mouth every 8 (eight) hours as needed for nausea or dizziness.  . diphenhydrAMINE (BENADRYL) 25 MG tablet Take 25 mg by mouth 2 (two) times daily as needed for allergies.  Marland Kitchen FLUoxetine (PROZAC) 20 MG capsule Take 3 capsules (60 mg total) by mouth daily.  . fluticasone (FLONASE) 50 MCG/ACT nasal spray Place 1 spray into both nostrils daily as needed for allergies. Sinus problems  . ibuprofen (ADVIL,MOTRIN) 200 MG tablet Take 600-800 mg by mouth 2 (two) times daily as needed (pain).   Marland Kitchen lamoTRIgine (LAMICTAL) 200 MG tablet Take 1 tablet (200 mg total) by mouth daily.  Marland Kitchen levothyroxine (SYNTHROID, LEVOTHROID) 88 MCG tablet Take 1 tablet (88 mcg total) by mouth daily before breakfast. For low thyroid function  . LORazepam (ATIVAN) 1 MG tablet Take 1 tablet (1 mg total) by mouth 2 (two) times daily as needed for anxiety.  . Magnesium 500 MG TABS Take 500 mg by mouth daily.  . Melatonin 5 MG CAPS Take 5 mg by mouth at bedtime as needed (sleep).  . methylphenidate (RITALIN) 10 MG tablet Take 1 tablet (10 mg total) by mouth daily.  . Multiple Vitamins-Minerals (EMERGEN-C VITAMIN C) PACK Take 1 packet by mouth daily.  . Omega-3 1000 MG CAPS Take 1,000 mg by mouth daily.  . Probiotic CAPS Take 3 capsules by mouth daily.  Marland Kitchen rOPINIRole (REQUIP) 0.5 MG tablet TAKE 1 TABLET BY MOUTH IN THE AFTERNOON AND 1 TABLET AT BEDTIME (Patient taking differently: Take 0.5mg s once daily in the evening)  . sodium chloride (OCEAN) 0.65 % SOLN nasal spray Place 1 spray into both nostrils as needed for congestion.  . valsartan-hydrochlorothiazide (DIOVAN-HCT) 320-25 MG per tablet Take 1 tablet by mouth daily. High  blood pressure control (Patient taking differently: Take 0.5 tablets by mouth daily. High blood pressure control)    Allergies:   Geodon [ziprasidone hydrochloride]; Lithium; Talwin [pentazocine]; Toradol [ketorolac tromethamine]; Abilify [aripiprazole]; Compazine [prochlorperazine edisylate]; and Diflucan [fluconazole]   Social History   Social History  . Marital status: Single    Spouse name: N/A  . Number of children: N/A  . Years of education: N/A   Occupational History  . 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 Smoking status: Never Smoker  . Smokeless  tobacco: Never Used  . Alcohol use No  . Drug use: No  . Sexual activity: Yes    Birth control/ protection: None   Other Topics Concern  . None   Social History Narrative   Divorced and lives alone   Has children     Family History:  The patient's family history includes Alcohol abuse in her father; Allergies in her brother, daughter, father, and sister; Bipolar disorder in her father; CAD in her mother; Cancer in her paternal grandfather; Colon cancer in her paternal grandmother; Depression in her mother; Heart disease in her maternal grandfather and mother; Hypertension in her mother; OCD in her paternal grandmother; Skin cancer in her father.   ROS:   Please see the history of present illness.    ROS All other systems reviewed and are negative.  No flowsheet data found.     PHYSICAL EXAM:   VS:  BP 110/68   Pulse (!) 59   Ht 5\' 5"  (1.651 m)   Wt 208 lb 6.4 oz (94.5 kg)   BMI 34.68 kg/m    GEN: Well nourished, well developed, in no acute distress  HEENT: normal  Neck: no JVD, carotid bruits, or masses Cardiac: RRR; no murmurs, rubs, or gallops,no edema.  Intact distal pulses bilaterally.  Respiratory:  clear to auscultation bilaterally, normal work of breathing GI: soft, nontender, nondistended, + BS MS: no deformity or atrophy  Skin: warm and dry, no rash Neuro:  Alert  and Oriented x 3, Strength and sensation are intact Psych: euthymic mood, full affect  Wt Readings from Last 3 Encounters:  06/24/16 208 lb 6.4 oz (94.5 kg)  06/08/16 210 lb (95.3 kg)  05/11/16 212 lb 1.9 oz (96.2 kg)      Studies/Labs Reviewed:   EKG:  EKG is ordered today and showed sinus bradycardia at 59bpm with no ST changes  Recent Labs: 06/08/2016: BUN 17; Creatinine, Ser 1.03; Hemoglobin 12.5; Platelets 217; Potassium 4.1; Sodium 139   Lipid Panel    Component Value Date/Time   CHOL 167 04/21/2013 0530   TRIG 106 04/21/2013 0530   HDL 44 04/21/2013 0530   CHOLHDL 3.8 04/21/2013 0530   VLDL 21 04/21/2013 0530   LDLCALC 102 (H) 04/21/2013 0530    Additional studies/ records that were reviewed today include:  Cardiac cath    ASSESSMENT:    1. Coronary artery disease involving native coronary artery of native heart without angina pectoris   2. Essential hypertension, benign   3. PAT (paroxysmal atrial tachycardia) ([place])   4. Hyperlipidemia LDL goal <70      PLAN:  In order of problems listed above:  1. ASCAD - cath showed 50-70% mid LAD proximal to the D1 and 75-80% mid OM with luminal irregularities in the mid RCA with normal LVF.  Medical therapy was recommended and if limiting symptoms occur on medications or convincing evidence of ischemia, then could consider obtuse marginal PCI and/or higher than usual risk PCI on the LAD (due to calcification and angulation). She has not had any angina symptoms.  She will continue on ASA, statin and BB.  She wants to hold off on adding long acting nitrates at this time since she is not having any anginal symptoms.    2. HTN - BP controlled today.  She will continue on diuretic, ARB and BB.  3. PAT s/p ablation - she has been maintaining NSR.  Continue Bystolic.    4. Hyperlipidemia - LDL goal is < 70.  She will continue on statin that was started 05/2016.  I will check FLP and ALT in 2 weeks.    Medication  Adjustments/Labs and Tests Ordered: Current medicines are reviewed at length with the patient today.  Concerns regarding medicines are outlined above.  Medication changes, Labs and Tests ordered today are listed in the Patient Instructions below.  Patient Instructions  Medication Instructions:  Your physician recommends that you continue on your current medications as directed. Please refer to the Current Medication list given to you today.   Labwork: You have a FASTING lab appointment May 18. You may come any time between 7:30 AM and 5:00 PM as long as you are FASTING.  Testing/Procedures: None  Follow-Up: Your physician wants you to follow-up in: 6 months with Dr. Radford Pax. You will receive a reminder letter in the mail two months in advance. If you don't receive a letter, please call our office to schedule the follow-up appointment.   Any Other Special Instructions Will Be Listed Below (If Applicable).     If you need a refill on your cardiac medications before your next appointment, please call your pharmacy.      Signed, Fransico Him, MD  06/24/2016 11:51 AM    [address], [place], [place]  [address] Phone: [phone]; Fax: [phone]

## 2016-06-24 NOTE — Patient Instructions (Signed)
Medication Instructions:  Your physician recommends that you continue on your current medications as directed. Please refer to the Current Medication list given to you today.   Labwork: You have a FASTING lab appointment May 18. You may come any time between 7:30 AM and 5:00 PM as long as you are FASTING.  Testing/Procedures: None  Follow-Up: Your physician wants you to follow-up in: 6 months with Dr. Radford Pax. You will receive a reminder letter in the mail two months in advance. If you don't receive a letter, please call our office to schedule the follow-up appointment.   Any Other Special Instructions Will Be Listed Below (If Applicable).     If you need a refill on your cardiac medications before your next appointment, please call your pharmacy.

## 2016-06-24 NOTE — Progress Notes (Signed)
[facility] MD/PA/NP OP Progress Note  06/24/2016 9:02 AM [name]  MRN:  [id number]  Chief Complaint:  Chief Complaint    Follow-up      HPI: Pt states she feels like she is 2 different people. She mood is up/down and changes quickly. One day she is ok and the next day she is depressed for 3 days.  Depression is much worse. 2 weeks ago she was in bed for 3 days. She felt apathetic, isolated, unmotivated with anhedonia. She was having passive SI during that time. She almost called the crisis line but stopped herself due to embarrassment.  Today denies SI/HI but does admit to passive thoughts of death.  Sleep is generally poor due to sleep apnea. She just got a new CPAP and that is helping some. She is sleeping a lot and energy is low.   Denies manic and hypomanic symptoms including periods of decreased need for sleep, increased energy,  impulsivity, FOI, and excessive spending.  Notes she has been having some difficulties with fiance.   PTSD is under control and notes no symptoms since last visit.  Anxiety is mild and better as compared to previous visits. States she has not noticed it because she is so focused on her depression.  Taking meds as prescribed and denies SE.   Today pt declined voluntary inpt psych admission.     Visit Diagnosis:    ICD-9-CM ICD-10-CM   1. Bipolar I disorder, most recent episode depressed ([place]) 296.50 F31.30 methylphenidate (RITALIN) 10 MG tablet     lamoTRIgine (LAMICTAL) 200 MG tablet     buPROPion (WELLBUTRIN XL) 150 MG 24 hr tablet  2. PTSD (post-traumatic stress disorder) 309.81 F43.10 LORazepam (ATIVAN) 1 MG tablet  3. GAD (generalized anxiety disorder) 300.02 F41.1 LORazepam (ATIVAN) 1 MG tablet    Past Psychiatric History: see H&P  Past Medical History:  Past Medical History:  Diagnosis Date  . Abscess of Bartholin's gland   . Acute vestibular neuronitis    Dr Lucia Gaskins  . ADHD (attention deficit hyperactivity disorder)   . Adverse  effect of general anesthetic    felt paralyzed while receiving anesthesia  . Anxiety   . Bipolar 1 disorder ([place])   . CAD (coronary artery disease), native coronary artery    cath 05/2016 showing 50-70% stenosis in the mid LAD proximal to the first diagonal and 70-80% small OM1. FFR of LAD  not performed because of difficulty with catheter control from the right radial.   . Chronic kidney disease    kidney cancer- pt states she has elected to not have it treated.  . Depression    Bipolar disorder/goes to [facility] for meds  . Difficult intubation    told by [facility] that she was hard to intubate 15b yrs ago in [place]- surgery since then no problems  . Dysrhythmia    h/o atrial tachycardia- s/p ablation   . GERD (gastroesophageal reflux disease)   . H/O echocardiogram 07/2012   Normla LVF w grade I siastolic dysfunction   . History of attention deficit disorder   . History of endometriosis   . History of pleural effusion   . Hypertension   . 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 Migraines   . Obesity   . OSA (obstructive sleep apnea)    uses oral appliance instead of CPAP  . PONV (postoperative nausea and vomiting)   . PTSD (post-traumatic stress disorder)   . PVC's (premature  ventricular contractions)   . Renal mass 02/15/2012  . RLS (restless legs syndrome)    Dr Gwenette Greet  . rt renal ca dx'd 12/2009   no treatment/ no surg  . Vertigo     Past Surgical History:  Procedure Laterality Date  . CARDIAC ELECTROPHYSIOLOGY [place] AND ABLATION  2000s  . LEFT HEART CATH AND CORONARY ANGIOGRAPHY N/A 06/08/2016   Procedure: Left Heart Cath and Coronary Angiography;  Surgeon: Belva Crome, MD;  Location: [facility];  Service: Cardiovascular;  Laterality: N/A;  . nasoseptal reconstruction  1990s  . THYROIDECTOMY  1990  . TONSILLECTOMY     as a child  . TUBAL LIGATION  1970s  . uterine mass removal  03/2012   was found to be benign    Family  Psychiatric History:  Family History  Problem Relation Age of Onset  . Allergies Father   . Skin cancer Father   . Bipolar disorder Father   . Alcohol abuse Father   . Heart disease Mother   . Depression Mother   . Hypertension Mother   . CAD Mother   . Colon cancer Paternal Grandmother   . OCD Paternal Grandmother   . Allergies Sister     multiple  . Allergies Brother     multiple  . Allergies Daughter   . Heart disease Maternal Grandfather   . Cancer Paternal Grandfather     Social History:  Social History   Social History  . Marital status: Single    Spouse name: N/A  . Number of children: N/A  . Years of education: N/A   Occupational History  . 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 Smoking status: Never Smoker  . Smokeless tobacco: Never Used  . Alcohol use No  . Drug use: No  . Sexual activity: Yes    Birth control/ protection: None   Other Topics Concern  . None   Social History Narrative   Divorced and lives alone   Has children    Allergies:  Allergies  Allergen Reactions  . Geodon [Ziprasidone Hydrochloride] Other (See Comments)    Extremely agitated  . Lithium Nausea Only and Other (See Comments)    Off balance, increased heart rate  . Talwin [Pentazocine] Other (See Comments)    Hallucinations   . Toradol [Ketorolac Tromethamine] Other (See Comments)    Chest pains  . Abilify [Aripiprazole] Other (See Comments)    jerking  . Compazine [Prochlorperazine Edisylate] Nausea And Vomiting  . Diflucan [Fluconazole] Nausea And Vomiting    Metabolic Disorder Labs: No results found for: HGBA1C, MPG No results found for: PROLACTIN Lab Results  Component Value Date   CHOL 167 04/21/2013   TRIG 106 04/21/2013   HDL 44 04/21/2013   CHOLHDL 3.8 04/21/2013   VLDL 21 04/21/2013   LDLCALC 102 (H) 04/21/2013     Current Medications: Current Outpatient Prescriptions  Medication Sig Dispense Refill  . aspirin  81 MG EC tablet Take 1 tablet (81 mg total) by mouth daily. For heart health 30 tablet 1  . atorvastatin (LIPITOR) 40 MG tablet Take 1 tablet (40 mg total) by mouth daily. 30 tablet 11  . beta carotene 25000 UNIT capsule Take 25,000 Units by mouth daily.    Marland Kitchen buPROPion (WELLBUTRIN XL) 300 MG 24 hr tablet TAKE 1 TABLET (300 MG TOTAL) BY MOUTH DAILY. FOR DEPRESSION 30 tablet 1  . BYSTOLIC 2.5 MG tablet TAKE 1 TABLET  BY MOUTH DAILY 30 tablet 11  . cholecalciferol (VITAMIN D) 1000 units tablet Take 2,000 Units by mouth daily.    Marland Kitchen dimenhyDRINATE (DRAMAMINE) 50 MG tablet Take 50 mg by mouth every 8 (eight) hours as needed for nausea or dizziness.    . diphenhydrAMINE (BENADRYL) 25 MG tablet Take 25 mg by mouth 2 (two) times daily as needed for allergies.    Marland Kitchen FLUoxetine (PROZAC) 20 MG capsule Take 60 mg by mouth daily.    . fluticasone (FLONASE) 50 MCG/ACT nasal spray Place 1 spray into both nostrils daily as needed for allergies. Sinus problems  2  . ibuprofen (ADVIL,MOTRIN) 200 MG tablet Take 600-800 mg by mouth 2 (two) times daily as needed (pain).     Marland Kitchen lamoTRIgine (LAMICTAL) 200 MG tablet Take 1 tablet (200 mg total) by mouth daily. 30 tablet 1  . levothyroxine (SYNTHROID, LEVOTHROID) 88 MCG tablet Take 1 tablet (88 mcg total) by mouth daily before breakfast. For low thyroid function    . LORazepam (ATIVAN) 1 MG tablet Take 1 tablet (1 mg total) by mouth 2 (two) times daily as needed for anxiety. 60 tablet 1  . Magnesium 500 MG TABS Take 500 mg by mouth daily.    . Melatonin 5 MG CAPS Take 5 mg by mouth at bedtime as needed (sleep).    . methylphenidate (RITALIN) 10 MG tablet Take 1 tablet (10 mg total) by mouth daily. 30 tablet 0  . Multiple Vitamins-Minerals (EMERGEN-C VITAMIN C) PACK Take 1 packet by mouth daily.    . Omega-3 1000 MG CAPS Take 1,000 mg by mouth daily.    . Probiotic CAPS Take 3 capsules by mouth daily.    Marland Kitchen rOPINIRole (REQUIP) 0.5 MG tablet TAKE 1 TABLET BY MOUTH IN THE  AFTERNOON AND 1 TABLET AT BEDTIME (Patient taking differently: Take 0.5mg s once daily in the evening) 90 tablet 1  . sodium chloride (OCEAN) 0.65 % SOLN nasal spray Place 1 spray into both nostrils as needed for congestion.    . valsartan-hydrochlorothiazide (DIOVAN-HCT) 320-25 MG per tablet Take 1 tablet by mouth daily. High blood pressure control (Patient taking differently: Take 0.5 tablets by mouth daily. High blood pressure control) 30 tablet 0   No current facility-administered medications for this visit.       Musculoskeletal: Strength & Muscle Tone: within normal limits Gait & Station: normal Patient leans: N/A  Psychiatric Specialty Exam: Review of Systems  Gastrointestinal: Positive for nausea and vomiting. Negative for abdominal pain and heartburn.  Musculoskeletal: Positive for back pain, joint pain and neck pain.  Neurological: Negative for dizziness, tremors, sensory change, seizures, loss of consciousness and headaches.  Psychiatric/Behavioral: Positive for depression. Negative for hallucinations, substance abuse and suicidal ideas. The patient has insomnia. The patient is not nervous/anxious.     Blood pressure 132/76, pulse 92, height 5\' 5"  (1.651 m), weight 208 lb (94.3 kg).Body mass index is 34.61 kg/m.  General Appearance: Casual  Eye Contact:  Good  Speech:  Clear and Coherent and Normal Rate  Volume:  Normal  Mood:  Depressed  Affect:  Congruent, Depressed and Tearful  Thought Process:  Goal Directed and Descriptions of Associations: Intact  Orientation:  Full (Time, Place, and Person)  Thought Content: Logical   Suicidal Thoughts:  No  Homicidal Thoughts:  No  Memory:  Immediate;   Good Recent;   Good Remote;   Good  Judgement:  Good  Insight:  Good  Psychomotor Activity:  Normal  Concentration:  Concentration: Good and Attention Span: Good  Recall:  Good  Fund of Knowledge: Good  Language: Good  Akathisia:  No  Handed:  Right  AIMS (if indicated):   n/a  Assets:  Communication Skills Desire for Improvement  ADL's:  Intact  Cognition: WNL  Sleep:  fair     Treatment Plan Summary:Medication management   Assessment: Bipolar disoder- current episode depressed; PTSD; GAD   Medication management with supportive therapy. Risks/benefits and SE of the medication discussed. Pt verbalized understanding and verbal consent obtained for treatment.  Affirm with the patient that the medications are taken as ordered. Patient expressed understanding of how their medications were to be used.    Meds: continue Ativan 1mg  po BID prn anxiety Continue Lamictal 200mg  po qD for Bipolar disorder. Pt states she has on/off mouth sores and is working with a Pharmacist, community for treatment. States it has been going on for a long while and is unsure when it started. Denies any other rash or sores anywhere else in the body.  Increase Wellbutrin XL 450mg  po qD for depression.  Continue Prozac 60mg  po qD for depression, anxiety and PTSD Continue Ritalin 10mg  po qD for mood augmentation.    Labs: 06/08/16 BMP WNL, CBC WNL, EKG NSR QTc 424  Therapy: brief supportive therapy provided. Discussed psychosocial stressors in detail.     Consultations:  Encouraged to continue individual therapy with [facility]  Pt denies SI and is at an acute low risk for suicide. Patient told to call clinic if any problems occur. Patient advised to go to ER if they should develop SI/HI, side effects, or if symptoms worsen. Has crisis numbers to call if needed. Pt verbalized understanding.  F/up in 1 months or sooner if needed    [name], MD 06/24/2016, 9:02 AM

## 2016-07-02 ENCOUNTER — Other Ambulatory Visit (HOSPITAL_COMMUNITY): Payer: Self-pay

## 2016-07-02 DIAGNOSIS — F3130 Bipolar disorder, current episode depressed, mild or moderate severity, unspecified: Secondary | ICD-10-CM

## 2016-07-02 MED ORDER — BUPROPION HCL ER (XL) 150 MG PO TB24: 450.0000 mg | ORAL_TABLET | Freq: Every day | ORAL | 0 refills | Status: DC

## 2016-07-02 MED ORDER — FLUOXETINE HCL 20 MG PO CAPS: 60.0000 mg | ORAL_CAPSULE | Freq: Every day | ORAL | 0 refills | Status: DC

## 2016-07-02 MED ORDER — LAMOTRIGINE 200 MG PO TABS: 200.0000 mg | ORAL_TABLET | Freq: Every day | ORAL | 0 refills | Status: DC

## 2016-07-02 NOTE — Progress Notes (Signed)
Patients pharmacy called, her insurance will not pay for a 30 day supply, patient will have to pay out of pocket. I went ahead and sent in 90 day supplies of her Wellbutrin, Lamictal and Prozac so they will be covered. Patient is on a fixed income and can not afford to pay out of pocket.

## 2016-07-13 ENCOUNTER — Ambulatory Visit (HOSPITAL_COMMUNITY): Payer: 59 | Admitting: Psychiatry

## 2016-08-03 ENCOUNTER — Ambulatory Visit (HOSPITAL_COMMUNITY): Payer: 59 | Admitting: Psychiatry

## 2016-08-06 ENCOUNTER — Other Ambulatory Visit: Payer: Self-pay

## 2016-08-12 ENCOUNTER — Encounter (HOSPITAL_COMMUNITY): Payer: Self-pay | Admitting: Psychiatry

## 2016-08-12 ENCOUNTER — Ambulatory Visit (INDEPENDENT_AMBULATORY_CARE_PROVIDER_SITE_OTHER): Payer: 59 | Admitting: Psychiatry

## 2016-08-12 DIAGNOSIS — F411 Generalized anxiety disorder: Secondary | ICD-10-CM | POA: Diagnosis not present

## 2016-08-12 DIAGNOSIS — Z79899 Other long term (current) drug therapy: Secondary | ICD-10-CM

## 2016-08-12 DIAGNOSIS — F4310 Post-traumatic stress disorder, unspecified: Secondary | ICD-10-CM | POA: Diagnosis not present

## 2016-08-12 DIAGNOSIS — Z818 Family history of other mental and behavioral disorders: Secondary | ICD-10-CM

## 2016-08-12 DIAGNOSIS — Z7982 Long term (current) use of aspirin: Secondary | ICD-10-CM | POA: Diagnosis not present

## 2016-08-12 DIAGNOSIS — F3130 Bipolar disorder, current episode depressed, mild or moderate severity, unspecified: Secondary | ICD-10-CM

## 2016-08-12 DIAGNOSIS — Z888 Allergy status to other drugs, medicaments and biological substances status: Secondary | ICD-10-CM | POA: Diagnosis not present

## 2016-08-12 DIAGNOSIS — Z791 Long term (current) use of non-steroidal anti-inflammatories (NSAID): Secondary | ICD-10-CM | POA: Diagnosis not present

## 2016-08-12 MED ORDER — LORAZEPAM 1 MG PO TABS: 1.0000 mg | ORAL_TABLET | Freq: Two times a day (BID) | ORAL | 1 refills | Status: DC | PRN

## 2016-08-12 MED ORDER — FLUOXETINE HCL 20 MG PO CAPS: 60.0000 mg | ORAL_CAPSULE | Freq: Every day | ORAL | 0 refills | Status: DC

## 2016-08-12 MED ORDER — METHYLPHENIDATE HCL 10 MG PO TABS: 15.0000 mg | ORAL_TABLET | Freq: Every day | ORAL | 0 refills | Status: DC

## 2016-08-12 MED ORDER — LAMOTRIGINE 200 MG PO TABS: 200.0000 mg | ORAL_TABLET | Freq: Every day | ORAL | 0 refills | Status: DC

## 2016-08-12 MED ORDER — BUPROPION HCL ER (XL) 150 MG PO TB24: 450.0000 mg | ORAL_TABLET | Freq: Every day | ORAL | 0 refills | Status: DC

## 2016-08-12 NOTE — Progress Notes (Signed)
BH MD/PA/NP OP Progress Note  08/12/2016 10:32 AM [name]  MRN:  [id number]  Chief Complaint:  Chief Complaint    Follow-up      HPI: "Terrible". States she is very depressed. She has no energy and is unmotivated and reports anhedonia. She spends all her time in bed or napping. She is not eating well and food doesn't taste good. This has been good on for about 2 months. She feels down for about half a week but each day is different. She feels a lot of peace in her bed. She denies SI/HI but admits that she has had passive thoughts of death.  A huge stressors for her is her fiances mental health and it is affecting their relationship. She understands she can not take care of both of them.   Sleep is poor at night but since she naps all day.   Denies manic and hypomanic symptoms including periods of decreased need for sleep, increased energy, mood lability, impulsivity, FOI, and excessive spending. She has been talking fast and loud.  Anxiety is mild and tolerable. She states her depression is more overwhelming.  PTSD- HV remains high. She was startled when someone came up behind her. Pt is having dreams. On separate occassions she has heard someone calling her name.  Taking meds as prescribed and denies SE. Med changes did not help at all.   Visit Diagnosis:    ICD-9-CM ICD-10-CM   1. Bipolar I disorder, most recent episode depressed ([place]) 296.50 F31.30 buPROPion (WELLBUTRIN XL) 150 MG 24 hr tablet     lamoTRIgine (LAMICTAL) 200 MG tablet     methylphenidate (RITALIN) 10 MG tablet  2. PTSD (post-traumatic stress disorder) 309.81 F43.10 LORazepam (ATIVAN) 1 MG tablet  3. GAD (generalized anxiety disorder) 300.02 F41.1 LORazepam (ATIVAN) 1 MG tablet      Past Psychiatric History:  Anxiety: Yes Bipolar Disorder: Yes Depression: Yes Mania: Yes Psychosis: No Schizophrenia: No Personality Disorder: No Hospitalization for psychiatric illness: Yes History of  Electroconvulsive Shock Therapy: No Prior Suicide Attempts: No   Past Medical History:  Past Medical History:  Diagnosis Date  . Abscess of Bartholin's gland   . Acute vestibular neuronitis    Dr Lucia Gaskins  . ADHD (attention deficit hyperactivity disorder)   . Adverse effect of general anesthetic    felt paralyzed while receiving anesthesia  . Anxiety   . Bipolar 1 disorder ([place])   . CAD (coronary artery disease)   . CAD (coronary artery disease), native coronary artery    cath 05/2016 showing 50-70% stenosis in the mid LAD proximal to the first diagonal and 70-80% small OM1. FFR of LAD  not performed because of difficulty with catheter control from the right radial.   . Chronic kidney disease    kidney cancer- pt states she has elected to not have it treated.  . Depression    Bipolar disorder/goes to [facility] for meds  . Difficult intubation    told by [facility] that she was hard to intubate 15b yrs ago in [place]- surgery since then no problems  . Dysrhythmia    h/o atrial tachycardia- s/p ablation   . GERD (gastroesophageal reflux disease)   . H/O echocardiogram 07/2012   [place] LVF w grade I siastolic dysfunction   . History of attention deficit disorder   . History of endometriosis   . History of pleural effusion   . Hyperlipidemia LDL goal <70 06/24/2016  . Hypertension   . 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 Migraines   . Obesity   . OSA (obstructive sleep apnea)    uses oral appliance instead of CPAP  . PONV (postoperative nausea and vomiting)   . PTSD (post-traumatic stress disorder)   . PVC's (premature ventricular contractions)   . Renal mass 02/15/2012  . RLS (restless legs syndrome)    Dr Gwenette Greet  . rt renal ca dx'd 12/2009   no treatment/ no surg  . Vertigo     Past Surgical History:  Procedure Laterality Date  . CARDIAC ELECTROPHYSIOLOGY [place] AND ABLATION  2000s  . LEFT HEART CATH AND CORONARY ANGIOGRAPHY N/A 06/08/2016    Procedure: Left Heart Cath and Coronary Angiography;  Surgeon: Belva Crome, MD;  Location: [facility];  Service: Cardiovascular;  Laterality: N/A;  . nasoseptal reconstruction  1990s  . THYROIDECTOMY  1990  . TONSILLECTOMY     as a child  . TUBAL LIGATION  1970s  . uterine mass removal  03/2012   was found to be benign    Family Psychiatric and Medical  History: Family History  Problem Relation Age of Onset  . Allergies Father   . Skin cancer Father   . Bipolar disorder Father   . Alcohol abuse Father   . Heart disease Mother   . Depression Mother   . Hypertension Mother   . CAD Mother   . Colon cancer Paternal Grandmother   . OCD Paternal Grandmother   . Allergies Sister        multiple  . Allergies Brother        multiple  . Allergies Daughter   . Heart disease Maternal Grandfather   . Cancer Paternal Grandfather     Social History:  Social History   Social History  . Marital status: Single    Spouse name: N/A  . Number of children: N/A  . Years of education: N/A   Occupational History  . 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 Smoking status: Never Smoker  . Smokeless tobacco: Never Used  . Alcohol use No  . Drug use: No  . Sexual activity: Yes    Birth control/ protection: None   Other Topics Concern  . None   Social History Narrative   Divorced and lives alone   Has children    Allergies:  Allergies  Allergen Reactions  . Geodon [Ziprasidone Hydrochloride] Other (See Comments)    Extremely agitated  . Lithium Nausea Only and Other (See Comments)    Off balance, increased heart rate  . Talwin [Pentazocine] Other (See Comments)    Hallucinations   . Toradol [Ketorolac Tromethamine] Other (See Comments)    Chest pains  . Abilify [Aripiprazole] Other (See Comments)    jerking  . Compazine [Prochlorperazine Edisylate] Nausea And Vomiting  . Diflucan [Fluconazole] Nausea And Vomiting    Metabolic  Disorder Labs: No results found for: HGBA1C, MPG No results found for: PROLACTIN Lab Results  Component Value Date   CHOL 167 04/21/2013   TRIG 106 04/21/2013   HDL 44 04/21/2013   CHOLHDL 3.8 04/21/2013   VLDL 21 04/21/2013   LDLCALC 102 (H) 04/21/2013     Current Medications: Current Outpatient Prescriptions  Medication Sig Dispense Refill  . aspirin 81 MG EC tablet Take 1 tablet (81 mg total) by mouth daily. For heart health 30 tablet 1  . atorvastatin (LIPITOR) 40 MG tablet Take 1 tablet (40  mg total) by mouth daily. 30 tablet 11  . beta carotene 25000 UNIT capsule Take 25,000 Units by mouth daily.    Marland Kitchen buPROPion (WELLBUTRIN XL) 150 MG 24 hr tablet Take 3 tablets (450 mg total) by mouth daily. 270 tablet 0  . BYSTOLIC 2.5 MG tablet TAKE 1 TABLET BY MOUTH DAILY 30 tablet 11  . cholecalciferol (VITAMIN D) 1000 units tablet Take 2,000 Units by mouth daily.    Marland Kitchen dimenhyDRINATE (DRAMAMINE) 50 MG tablet Take 50 mg by mouth every 8 (eight) hours as needed for nausea or dizziness.    . diphenhydrAMINE (BENADRYL) 25 MG tablet Take 25 mg by mouth 2 (two) times daily as needed for allergies.    Marland Kitchen FLUoxetine (PROZAC) 20 MG capsule Take 3 capsules (60 mg total) by mouth daily. 270 capsule 0  . fluticasone (FLONASE) 50 MCG/ACT nasal spray Place 1 spray into both nostrils daily as needed for allergies. Sinus problems  2  . lamoTRIgine (LAMICTAL) 200 MG tablet Take 1 tablet (200 mg total) by mouth daily. 90 tablet 0  . levothyroxine (SYNTHROID, LEVOTHROID) 88 MCG tablet Take 1 tablet (88 mcg total) by mouth daily before breakfast. For low thyroid function    . LORazepam (ATIVAN) 1 MG tablet Take 1 tablet (1 mg total) by mouth 2 (two) times daily as needed for anxiety. 60 tablet 1  . Magnesium 500 MG TABS Take 500 mg by mouth daily.    . Melatonin 5 MG CAPS Take 5 mg by mouth at bedtime as needed (sleep).    . methylphenidate (RITALIN) 10 MG tablet Take 1 tablet (10 mg total) by mouth daily. 30  tablet 0  . Multiple Vitamins-Minerals (EMERGEN-C VITAMIN C) PACK Take 1 packet by mouth daily.    . Omega-3 1000 MG CAPS Take 1,000 mg by mouth daily.    . Probiotic CAPS Take 3 capsules by mouth daily.    Marland Kitchen rOPINIRole (REQUIP) 0.5 MG tablet TAKE 1 TABLET BY MOUTH IN THE AFTERNOON AND 1 TABLET AT BEDTIME (Patient taking differently: Take 0.5mg s once daily in the evening) 90 tablet 1  . sodium chloride (OCEAN) 0.65 % SOLN nasal spray Place 1 spray into both nostrils as needed for congestion.    . valsartan-hydrochlorothiazide (DIOVAN-HCT) 320-25 MG per tablet Take 1 tablet by mouth daily. High blood pressure control (Patient taking differently: Take 0.5 tablets by mouth daily. High blood pressure control) 30 tablet 0  . ibuprofen (ADVIL,MOTRIN) 200 MG tablet Take 600-800 mg by mouth 2 (two) times daily as needed (pain).      No current facility-administered medications for this visit.       Musculoskeletal: Strength & Muscle Tone: within normal limits Gait & Station: normal Patient leans: N/A  Psychiatric Specialty Exam: Review of Systems  Musculoskeletal: Positive for joint pain and myalgias. Negative for back pain and neck pain.  Neurological: Positive for headaches. Negative for dizziness, sensory change and speech change.  Psychiatric/Behavioral: Positive for depression. Negative for hallucinations, substance abuse and suicidal ideas. The patient has insomnia. The patient is not nervous/anxious.     Blood pressure 110/66, pulse 72, height 5\' 5"  (1.651 m), weight 205 lb (93 kg), SpO2 98 %.Body mass index is 34.11 kg/m.  General Appearance: Fairly Groomed  Eye Contact:  Good  Speech:  Clear and Coherent and Normal Rate  Volume:  Normal  Mood:  Depressed  Affect:  Congruent  Thought Process:  Goal Directed and Descriptions of Associations: Circumstantial  Orientation:  Full (Time,  Place, and Person)  Thought Content: Logical   Suicidal Thoughts:  No  Homicidal Thoughts:  No   Memory:  Immediate;   Good Recent;   Good Remote;   Good  Judgement:  Good  Insight:  Good  Psychomotor Activity:  Normal  Concentration:  Concentration: Good and Attention Span: Good  Recall:  Good  Fund of Knowledge: Good  Language: Good  Akathisia:  No  Handed:  Right  AIMS (if indicated):  n/a  Assets:  Communication Skills Desire for Improvement Housing Intimacy Leisure Time Social Support Talents/Skills  ADL's:  Intact  Cognition: WNL  Sleep:  poor     Treatment Plan Summary:Medication management  Assessment: Bipolar disorder- current episode depressed; PTSD; GAD   Medication management with supportive therapy. Risks/benefits and SE of the medication discussed. Pt verbalized understanding and verbal consent obtained for treatment.  Affirm with the patient that the medications are taken as ordered. Patient expressed understanding of how their medications were to be used.   Meds: Ativan 1mg  po BID prn anxiety Lamictal 200mg  po qD for Bipolar disorder Wellbutrin XL 450mg  po qD for depression Prozac 60mg  po qD for depression, anxiety and PTSD Increase Ritalin to 15mg  po qD for depression mood augmentation  Labs: none  Therapy: brief supportive therapy provided. Discussed psychosocial stressors in detail.    Consultations:  Encouraged to continue individual therapy  Pt denies SI and is at an acute low risk for suicide. Patient told to call clinic if any problems occur. Patient advised to go to ER if they should develop SI/HI, side effects, or if symptoms worsen. Has crisis numbers to call if needed. Pt verbalized understanding.  F/up in 2  months or sooner if needed   [name], MD 08/12/2016, 10:32 AM

## 2016-08-24 ENCOUNTER — Ambulatory Visit (HOSPITAL_COMMUNITY): Payer: Self-pay | Admitting: Psychiatry

## 2016-08-25 ENCOUNTER — Telehealth (HOSPITAL_COMMUNITY): Payer: Self-pay

## 2016-08-25 NOTE — Telephone Encounter (Signed)
Medication management - Patient left a message wanting to confirm correct dosage of Wellbutrin XL as thought she was going to be going down on dosage of 150 mg, 3 a day.  Wants MD to verify dosage.

## 2016-08-26 NOTE — Telephone Encounter (Signed)
No she is supposed to stay on 450mg 

## 2016-08-31 ENCOUNTER — Ambulatory Visit: Payer: Self-pay

## 2016-09-02 ENCOUNTER — Other Ambulatory Visit: Payer: Self-pay | Admitting: Pulmonary Disease

## 2016-09-15 ENCOUNTER — Ambulatory Visit (INDEPENDENT_AMBULATORY_CARE_PROVIDER_SITE_OTHER): Payer: 59 | Admitting: Psychiatry

## 2016-09-15 DIAGNOSIS — F329 Major depressive disorder, single episode, unspecified: Secondary | ICD-10-CM | POA: Diagnosis not present

## 2016-09-15 DIAGNOSIS — F3130 Bipolar disorder, current episode depressed, mild or moderate severity, unspecified: Secondary | ICD-10-CM

## 2016-09-18 NOTE — Progress Notes (Signed)
   THERAPIST PROGRESS NOTE   Session Time: 1:00-2:00  Participation Level:Active  Behavioral Response:CasualAlertDepressed  Type of Therapy: Individual Therapy  Treatment Goals addressed: Coping  Interventions:strength-based; solution focused  Summary: ESABELLA STOCKINGER a 71 y.o.femalewho presents with major depressive disorder.   Suicidal/Homicidal:Nowithout intent/plan  Therapist Response:Pt. Presented with depressed mood. Counselor discussed briefly Pt.'s genetic testing results which indicated limited class of antidepressants that work for her. Pt. Was encouraged to discuss the result with her psychiatrist next week. Pt. Was encouraged to think of this as positive and further confirmation of her chemical depression. Pt. Discussed that her boyfriend's depression and psychosis have worsened which has affected her depression. Pt. Discussed that she often feels helpless to help him and she fears that she will not be able to maintain her role as caregiver in the relationship. Pt. Was encouraged to have him sign a release so that she can receive phone call reminders about his mental health care. Pt. Discussed that she worries about the condition of her home and feels unable to make changes about her home. Pt. Was encouraged to make her medication management her first priority and once this has been addressed that we would return to management of her home. Pt. Discussed that she has lost 12-13 pounds, that she feels better in her clothes, and just has not felt like eating as much.   Plan: Pt. To continue with solution-focusedbased therapy. Return again in 2weeks.  Diagnosis:Axis I:Depressive Disorder NOS  Axis II:No diagnosis  Nancie Neas, [facility] 09/18/2016

## 2016-10-07 ENCOUNTER — Ambulatory Visit (HOSPITAL_COMMUNITY): Payer: Self-pay | Admitting: Psychiatry

## 2016-10-20 ENCOUNTER — Ambulatory Visit (INDEPENDENT_AMBULATORY_CARE_PROVIDER_SITE_OTHER): Payer: 59 | Admitting: Psychiatry

## 2016-10-20 DIAGNOSIS — F3130 Bipolar disorder, current episode depressed, mild or moderate severity, unspecified: Secondary | ICD-10-CM

## 2016-10-22 NOTE — Progress Notes (Signed)
   THERAPIST PROGRESS NOTE  Session Time: 1:35-2:30  Participation Level:Active  Behavioral Response:CasualAlertEuthymic  Type of Therapy: Individual Therapy  Treatment Goals addressed: Coping  Interventions:strength-based; solution focused  Summary: [name] a 71 y.o.femalewho presents with major depressive disorder.   Suicidal/Homicidal:No without intent/plan  Therapist Response:Pt. Presented with significantly improved mood compared to last session. Pt. Reports that her appetite is good, she is sleeping, and has been more active. Pt. Discussed that she is feeling better, has made considerable progress cleaning her apartment and getting rid of clutter. Pt. Discussed that she accepted invitations on three occasions to attend church with friends and she initiated conversations with several of her neighbors in the last week. Pt. Discussed that generally she has been isolating less and feeling more motivation to do things. Pt. Discussed her plans to go the the mountains for a few days with her boyfriend. Pt. Discussed that after receiving her genetic testing results from the [facility] that she started tapering off of her medications without consult from her psychiatrist. Pt. Was advised that she was not to do this without direction form her psychiatrist.   Plan: Pt. To continue with solution-focusedbased therapy. Return again in 2weeks.  Diagnosis:Axis I:Depressive Disorder NOS  Axis II:No diagnosis    Nancie Neas, [facility] 10/22/2016

## 2016-10-25 ENCOUNTER — Ambulatory Visit: Payer: Medicare Other | Admitting: Neurology

## 2016-10-25 ENCOUNTER — Telehealth: Payer: Self-pay | Admitting: Neurology

## 2016-10-25 NOTE — Telephone Encounter (Signed)
Error

## 2016-10-25 NOTE — Telephone Encounter (Signed)
This patient canceled same day of a new patient appointment. Patient claims that she was sick.

## 2016-10-26 ENCOUNTER — Telehealth: Payer: Self-pay | Admitting: Pulmonary Disease

## 2016-10-26 ENCOUNTER — Encounter: Payer: Self-pay | Admitting: Neurology

## 2016-10-26 NOTE — Telephone Encounter (Signed)
lmtcb for pt.  

## 2016-10-27 NOTE — Telephone Encounter (Signed)
Called and lmomtcb x 2 for the pt.  Pt will need to be scheduled for appt with RA to discus.. Thanks

## 2016-10-27 NOTE — Telephone Encounter (Signed)
Pt returned phone call [phone].

## 2016-10-28 NOTE — Telephone Encounter (Signed)
Noted will close message. 

## 2016-10-28 NOTE — Telephone Encounter (Signed)
Patient is scheduled with Dr. Elsworth Soho on 10/29/2016 at 3:45.

## 2016-10-28 NOTE — Telephone Encounter (Signed)
lmomtcb x 3 for the pt.   

## 2016-10-29 ENCOUNTER — Ambulatory Visit (INDEPENDENT_AMBULATORY_CARE_PROVIDER_SITE_OTHER): Payer: Medicare Other | Admitting: Pulmonary Disease

## 2016-10-29 ENCOUNTER — Encounter: Payer: Self-pay | Admitting: Pulmonary Disease

## 2016-10-29 VITALS — BP 120/78 | HR 68 | Ht 65.0 in | Wt 210.0 lb

## 2016-10-29 DIAGNOSIS — G4733 Obstructive sleep apnea (adult) (pediatric): Secondary | ICD-10-CM

## 2016-10-29 DIAGNOSIS — G2581 Restless legs syndrome: Secondary | ICD-10-CM

## 2016-10-29 NOTE — Assessment & Plan Note (Signed)
Based on DME requirements, we will repeat home sleep study. Based on this we will start her on CPAP of 9 cm. Compliance was again emphasized

## 2016-10-29 NOTE — Progress Notes (Signed)
   Subjective:    Patient ID: [name], female    DOB: [birth year], 71 y.o.   MRN: [id number]  HPI  71 year old woman for FU of restless leg syndrome and sleep apnea  She has  bipolar disorder and anxiety On multiple medications. Prozac is being tapered to off. On her sleep study in 2017, AHI was worse compared to thousand 4, she was started on CPAP of 9 cm but had poor compliance and this was discontinued by DME She now continues to have daytime fatigue and somnolence and would like to get back on CPAP but was told by DME to undergo repeat sleep study She also complains of sadness of mood and daily fatigue.  She reports some hoarseness and wonders if she has asthma, has never needed inhalers of any kind. Restless leg symptoms are better, over the years she has been able to decrease her Requip to once at bedtime. She denies any symptoms suggestive augmentation   Significant tests/ events reviewed   NPSG 2004: AHI 10/hr PSG 07/2015 (214 lbs)  AHI 23/h, mild PLMs 23/h but no sig arousals  CPAP titration 09/2015 9 cm     Review of Systems Patient denies significant dyspnea,cough, hemoptysis,  chest pain, palpitations, pedal edema, orthopnea, paroxysmal nocturnal dyspnea, lightheadedness, nausea, vomiting, abdominal or  leg pains      Objective:   Physical Exam  Gen. Pleasant, well-nourished, in no distress ENT - no thrush, no post nasal drip Neck: No JVD, no thyromegaly, no carotid bruits Lungs: no use of accessory muscles, no dullness to percussion, clear without rales or rhonchi  Cardiovascular: Rhythm regular, heart sounds  normal, no murmurs or gallops, no peripheral edema Musculoskeletal: No deformities, no cyanosis or clubbing        Assessment & Plan:

## 2016-10-29 NOTE — Patient Instructions (Signed)
Home sleep study Continue on requip

## 2016-10-29 NOTE — Assessment & Plan Note (Signed)
Continue Requip 0.5 mg at bedtime This seems to control her symptoms and there is no evidence of augmentation

## 2016-11-03 ENCOUNTER — Ambulatory Visit (INDEPENDENT_AMBULATORY_CARE_PROVIDER_SITE_OTHER): Payer: 59 | Admitting: Psychiatry

## 2016-11-03 DIAGNOSIS — F3130 Bipolar disorder, current episode depressed, mild or moderate severity, unspecified: Secondary | ICD-10-CM | POA: Diagnosis not present

## 2016-11-04 ENCOUNTER — Other Ambulatory Visit: Payer: Self-pay | Admitting: Pulmonary Disease

## 2016-11-04 DIAGNOSIS — G4733 Obstructive sleep apnea (adult) (pediatric): Secondary | ICD-10-CM

## 2016-11-05 ENCOUNTER — Telehealth: Payer: Self-pay | Admitting: Pulmonary Disease

## 2016-11-05 ENCOUNTER — Other Ambulatory Visit: Payer: Self-pay | Admitting: *Deleted

## 2016-11-05 DIAGNOSIS — G4733 Obstructive sleep apnea (adult) (pediatric): Secondary | ICD-10-CM | POA: Diagnosis not present

## 2016-11-05 NOTE — Telephone Encounter (Signed)
Received a note from RA that stated that the patient ate lobster the night before her test and became sick. Because of this, she will need to repeat her HST. Will order this again once the patient is aware.

## 2016-11-08 NOTE — Telephone Encounter (Signed)
Left message for patient to call back  

## 2016-11-10 NOTE — Progress Notes (Signed)
   THERAPIST PROGRESS NOTE  Session Time: 1:40-2:35  Participation Level:Active  Behavioral Response:CasualAlertEuthymic  Type of Therapy: Individual Therapy  Treatment Goals addressed: Coping  Interventions:strength-based; solution focused  Summary: QUINCI GAVIDIA a 71 y.o.femalewho presents with major depressive disorder.   Suicidal/Homicidal:No without intent/plan  Therapist Response:Pt. Continues to present with euthymic mood. Pt. Reports that her appetite continues to be good and that she is sleeping well. Pt. Presents as talkative and engaged in the therapeutic process. Pt. Discussed ongoing stressor related to her partner's mental illness and pattern of co-dependent behavior. Pt. Discussed her partner's coming out to her as a cross dresser to her and implications in regards to their relationship. Significant time in session was spent in processing was this meant in terms of his sexuality and what it meant in terms of acceptance of their relationship. Pt. Is struggling with acceptance of what this means for their relationships and dealing with range of feelings from sadness, anger, and disappointment. Pt. Continues to have discontinued her anti-depressant and was discouraged from doing so without consult with her psychiatrist.   Plan: Pt. To continue with strength-based therapy. Return again in 2weeks.  Diagnosis:Axis I:Depressive Disorder NOS  Axis II:No diagnosis    Nancie Neas, [facility] 11/10/2016

## 2016-11-11 ENCOUNTER — Telehealth (HOSPITAL_COMMUNITY): Payer: Self-pay

## 2016-11-11 NOTE — Telephone Encounter (Signed)
HST has been reordered. Pt is aware and voiced her understanding. Nothing further needed.

## 2016-11-11 NOTE — Telephone Encounter (Signed)
Can she come in at [place] on Sept 13?

## 2016-11-11 NOTE — Telephone Encounter (Signed)
Patient is returning phone call.  °

## 2016-11-11 NOTE — Telephone Encounter (Signed)
Patient called, she does not have an appointment until October -she needs a refill on her Ritalin and she said that she can not wait until October to see you. Patient is on the cancellation list but is asking if you could get her in sooner

## 2016-11-15 ENCOUNTER — Telehealth (HOSPITAL_COMMUNITY): Payer: Self-pay

## 2016-11-15 ENCOUNTER — Other Ambulatory Visit (HOSPITAL_COMMUNITY): Payer: Self-pay | Admitting: Psychiatry

## 2016-11-15 DIAGNOSIS — F3130 Bipolar disorder, current episode depressed, mild or moderate severity, unspecified: Secondary | ICD-10-CM

## 2016-11-15 MED ORDER — METHYLPHENIDATE HCL 10 MG PO TABS: 15.0000 mg | ORAL_TABLET | Freq: Every day | ORAL | 0 refills | Status: DC

## 2016-11-15 NOTE — Telephone Encounter (Signed)
Yes that is fine, NCCSd reviewed

## 2016-11-15 NOTE — Telephone Encounter (Signed)
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 Loni Muse addressed the earlier appointment but not the Ritalin and patient is out, can you refill? Please review and advise, thank you

## 2016-11-16 ENCOUNTER — Telehealth: Payer: Self-pay | Admitting: Pulmonary Disease

## 2016-11-16 ENCOUNTER — Telehealth (HOSPITAL_COMMUNITY): Payer: Self-pay | Admitting: Psychiatry

## 2016-11-16 NOTE — Telephone Encounter (Signed)
11/16/16 Patient came to pick-up rx script EA#[id number].Marland KitchenMariana Frank

## 2016-11-16 NOTE — Telephone Encounter (Signed)
Spoke with patient. She stated that her throat has been scratchy for the past month and she has been hoarse. She wanted to see RA. Advised that RA's next appt wouldn't be until October. Scheduled patient with TP in [place] at 1145. She is aware of the location and area. Patient verbalized understanding. Nothing else needed at time of visit.

## 2016-11-18 ENCOUNTER — Ambulatory Visit: Payer: Self-pay | Admitting: Adult Health

## 2016-11-23 ENCOUNTER — Ambulatory Visit (INDEPENDENT_AMBULATORY_CARE_PROVIDER_SITE_OTHER): Payer: Medicare Other | Admitting: Psychiatry

## 2016-11-23 DIAGNOSIS — F3130 Bipolar disorder, current episode depressed, mild or moderate severity, unspecified: Secondary | ICD-10-CM

## 2016-11-23 NOTE — Progress Notes (Signed)
   THERAPIST PROGRESS NOTE  Session Time: 1:10-2:00  Participation Level:Active  Behavioral Response:CasualAlertEuthymic  Type of Therapy: Individual Therapy  Treatment Goals addressed: Coping  Interventions:strength-based; solution focused  Summary: [name] a 71 y.o.femalewho presents with major depressive disorder.   Suicidal/Homicidal:No without intent/plan  Therapist Response:Pt. Presented with euthymic mood. Pt. Reported that she was feeling much better, had not felt any signs of depression in several weeks. Pt. Attributed improvement in mood to renewed interest in travel and gems that she shares with her boyfriend. Pt. Reported that since last session that she went on another trip with her boyfriend to look at [employer] and that they had two more trips planned. Pt. Discussed that she continues to have concerns about her boyfriend's mental health especially his poor self-care and recent self-harming behavior that required her to set new boundaries in their relationship. Pt. Discussed that she has been able to make progress in organizing her home and has prepared several boxes to give to homeless organizations and is feeling less overwhelmed by material possessions and is developing greater comfort with giving things away that do not have value for her or she does not think she will have a future use.   Plan: Pt. To continue with strength-based therapy. Return again in 2weeks.  Diagnosis:Axis I:Depressive Disorder NOS  Axis II:No diagnosis    Nancie Neas, [facility] 11/23/2016

## 2016-12-01 ENCOUNTER — Telehealth: Payer: Self-pay | Admitting: Pulmonary Disease

## 2016-12-01 DIAGNOSIS — G4733 Obstructive sleep apnea (adult) (pediatric): Secondary | ICD-10-CM

## 2016-12-01 NOTE — Telephone Encounter (Signed)
[name] just spoke to pt & she is coming today to pick up machine.  Nothing further needed.

## 2016-12-02 ENCOUNTER — Ambulatory Visit (HOSPITAL_COMMUNITY): Payer: Medicare Other | Admitting: Psychiatry

## 2016-12-07 ENCOUNTER — Ambulatory Visit (INDEPENDENT_AMBULATORY_CARE_PROVIDER_SITE_OTHER): Payer: Medicare Other | Admitting: Psychiatry

## 2016-12-07 DIAGNOSIS — F3130 Bipolar disorder, current episode depressed, mild or moderate severity, unspecified: Secondary | ICD-10-CM | POA: Diagnosis not present

## 2016-12-09 ENCOUNTER — Ambulatory Visit: Payer: Medicare Other | Admitting: Neurology

## 2016-12-10 ENCOUNTER — Other Ambulatory Visit: Payer: Self-pay | Admitting: *Deleted

## 2016-12-10 ENCOUNTER — Ambulatory Visit: Payer: Self-pay | Admitting: Adult Health

## 2016-12-10 ENCOUNTER — Institutional Professional Consult (permissible substitution): Payer: Self-pay | Admitting: Internal Medicine

## 2016-12-10 ENCOUNTER — Telehealth: Payer: Self-pay | Admitting: Pulmonary Disease

## 2016-12-10 DIAGNOSIS — G4733 Obstructive sleep apnea (adult) (pediatric): Secondary | ICD-10-CM | POA: Diagnosis not present

## 2016-12-10 NOTE — Telephone Encounter (Signed)
Per RA, HST showed OSA. Wishes to have her start on 9cm CPAP machine.

## 2016-12-10 NOTE — Telephone Encounter (Signed)
LMTCB

## 2016-12-13 NOTE — Progress Notes (Signed)
Session Time: 1:10-2:00  Participation Level:Active  Behavioral Response:CasualAlertEuthymic  Type of Therapy: Individual Therapy  Treatment Goals addressed: Coping  Interventions:strength-based; solution focused  Summary: [name] a 71 y.o.femalewho presents with major depressive disorder; ADD  Suicidal/Homicidal:No without intent/plan  Therapist Response:Pt.continues to present with euthymic mood. Pt. Continues to report that major concern that contributes to her depression is disorganization in her home. Pt. Discussed that she understands cognitively all of the the things to tell herself to do such as "I have not used this in years" "I can donate this" "I don't enjoy this" but is not able to physically part with items. Pt. Requested a referral to a therapist that will help her to make decision about items and assist her with services that will remove clutter from her home. Pt. Was able to make the connection between the items and her mood. Pt. Stated " I understand the items that need to be given away, but for some reason I am not able to actually part with them." Pt. Processed during session the difficulty that she is having parting with the item and the emotional connection that she has with some of the items. Pt. Indicated that if she has emotional support during the process that it will be easier for her to let the items go.   Plan: Pt. To continue with strength-based therapy. Return again in 2weeks.  Diagnosis:Depressive Disorder NOS

## 2016-12-16 NOTE — Telephone Encounter (Signed)
LMTCB x2  

## 2016-12-17 NOTE — Telephone Encounter (Signed)
LMTCB x3. Will close this message.

## 2016-12-21 ENCOUNTER — Ambulatory Visit (HOSPITAL_COMMUNITY): Payer: Self-pay | Admitting: Psychiatry

## 2016-12-23 ENCOUNTER — Telehealth: Payer: Self-pay | Admitting: Pulmonary Disease

## 2016-12-23 DIAGNOSIS — G4733 Obstructive sleep apnea (adult) (pediatric): Secondary | ICD-10-CM

## 2016-12-23 NOTE — Telephone Encounter (Signed)
Frank, Brooke M, CMA      1:32 PM  Note    Per RA, HST showed OSA. Wishes to have her start on 9cm CPAP machine     Advised pt of results. Pt understood and nothing further is needed.   CPAP ordered.  Advised pt of results. Pt understood and nothing further is needed.

## 2016-12-24 ENCOUNTER — Other Ambulatory Visit (HOSPITAL_COMMUNITY): Payer: Medicare Other | Attending: Psychiatry | Admitting: Psychiatry

## 2016-12-24 ENCOUNTER — Encounter (HOSPITAL_COMMUNITY): Payer: Self-pay | Admitting: Psychiatry

## 2016-12-24 DIAGNOSIS — F4310 Post-traumatic stress disorder, unspecified: Secondary | ICD-10-CM | POA: Diagnosis not present

## 2016-12-24 DIAGNOSIS — F331 Major depressive disorder, recurrent, moderate: Secondary | ICD-10-CM | POA: Insufficient documentation

## 2016-12-24 DIAGNOSIS — F3130 Bipolar disorder, current episode depressed, mild or moderate severity, unspecified: Secondary | ICD-10-CM

## 2016-12-24 DIAGNOSIS — F411 Generalized anxiety disorder: Secondary | ICD-10-CM | POA: Insufficient documentation

## 2016-12-24 NOTE — Progress Notes (Signed)
[name] is a 71 y.o. , divorced, Caucasian female, who was a self referral treatment for worsening depressive and anxiety symptoms with passive SI. Pt denied a plan or intent. Pt is well known to this Probation officer. CC: previous MH-IOP chart/notes for history. Stressors: 1) Conflictual relationship with fiance'. According to pt, they were reunited ~ two ysr ago. "We dated yrs ago." He is dx with Schizo-affective D/O. "He is difficult to deal with at times." Pt states he was hospitalized several times last yr. Pt reports he inherited money from a trust fund. He has asked pt to marry him and she said "yes." According to pt, they will keep separate homes. According to pt, he isn't doing very well.  "He hasn't been seeing his psychiatrist and therapist."  Pt states he made a suicide gesture a couple of weeks by cutting his wrists at her home.  Pt states she continues to worry about her troublesome daughter. "She still doesn't talk to me, nor anyone in my family." Pt also reports she misses her best friend who moved to [place] last yr. 2) Renal Cancer. Pt is refusing any treatment for it. Family hx: Father (ETOH).  A:  Re-oriented pt to MH-IOP.  F/U with Dr. Doyne Keel and Eloise Levels, PhD.  Strongly encouraged support groups; especially at [facility].  R:  Pt receptive.         Carlis Abbott, RITA, M.Ed, CNA

## 2016-12-27 ENCOUNTER — Encounter (HOSPITAL_COMMUNITY): Payer: Self-pay | Admitting: Psychiatry

## 2016-12-27 ENCOUNTER — Other Ambulatory Visit (HOSPITAL_COMMUNITY): Payer: Medicare Other | Admitting: Licensed Clinical Social Worker

## 2016-12-27 DIAGNOSIS — F331 Major depressive disorder, recurrent, moderate: Secondary | ICD-10-CM | POA: Diagnosis not present

## 2016-12-27 MED ORDER — METHYLPHENIDATE HCL ER (LA) 20 MG PO CP24: 20.0000 mg | ORAL_CAPSULE | Freq: Every day | ORAL | 0 refills | Status: DC

## 2016-12-27 NOTE — Progress Notes (Signed)
Psychiatric Initial Adult Assessment   Patient Identification: [name] MRN:  [id number] Date of Evaluation:  12/27/2016 Referral Source: self Chief Complaint: depression worsenedVisit Diagnosis: major depression recurrent moderate.  History of bipolar 1 depressed diagnosis, PTSD.  GAD  History of Present Illness:  [name] has been in this IOP in the past.  Since last seen she has resumed a relationship with a man which over all has been good for her but recently has created stress.  The man has mental issues of his own and when he takes his medications he is goo but when he does not he can be awful to her emotionally.  He cut his wrists about 2 weeks ago and that started the current spiral downward.  She was already depressed and unable to take care of usual things around the house so that it is like a hoarder's house and it depresses her she has to live in the mess.  She still feels lonely and anticipates this relationship ending and making it worse.  She still has no relationship with her daughter and that makes everything worse.depressive symptoms will be listed below.  She had a genetic testing done and the 2 medications was taking were deemed not effective.  So she stopped the fluoxetine and buproprion and has felt no worse she says after 2 months.  The methylphenidate helps with attention which helps deal with depression better but lasts only about 3 hours she says.  Associated Signs/Symptoms: Depression Symptoms:  depressed mood, anhedonia, insomnia, hypersomnia, fatigue, difficulty concentrating, hopelessness, impaired memory, anxiety, (Hypo) Manic Symptoms:  Irritable Mood, Anxiety Symptoms:  Excessive Worry, Psychotic Symptoms:  none PTSD Symptoms: Negative  Past Psychiatric History: ongoing outpatient therapy and history of inpatient stays  Previous Psychotropic Medications: Yes   Substance Abuse History in the last 12 months:  No.  Consequences of Substance  Abuse: Negative  Past Medical History:  Past Medical History:  Diagnosis Date  . Abscess of Bartholin's gland   . Acute vestibular neuronitis    Dr Lucia Gaskins  . ADHD (attention deficit hyperactivity disorder)   . Adverse effect of general anesthetic    felt paralyzed while receiving anesthesia  . Anxiety   . Bipolar 1 disorder ([place])   . CAD (coronary artery disease)   . CAD (coronary artery disease), native coronary artery    cath 05/2016 showing 50-70% stenosis in the mid LAD proximal to the first diagonal and 70-80% small OM1. FFR of LAD  not performed because of difficulty with catheter control from the right radial.   . Chronic kidney disease    kidney cancer- pt states she has elected to not have it treated.  . Depression    Bipolar disorder/goes to [facility] for meds  . Difficult intubation    told by [facility] that she was hard to intubate 15b yrs ago in [place]- surgery since then no problems  . Dysrhythmia    h/o atrial tachycardia- s/p ablation   . GERD (gastroesophageal reflux disease)   . H/O echocardiogram 07/2012   [place] LVF w grade I siastolic dysfunction   . History of attention deficit disorder   . History of endometriosis   . History of pleural effusion   . Hyperlipidemia LDL goal <70 06/24/2016  . Hypertension   . 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 Migraines   . Obesity   . OSA (obstructive sleep apnea)    uses oral appliance instead of CPAP  .  PONV (postoperative nausea and vomiting)   . PTSD (post-traumatic stress disorder)   . PVC's (premature ventricular contractions)   . Renal mass 02/15/2012  . RLS (restless legs syndrome)    Dr Gwenette Greet  . rt renal ca dx'd 12/2009   no treatment/ no surg  . Vertigo     Past Surgical History:  Procedure Laterality Date  . CARDIAC ELECTROPHYSIOLOGY [place] AND ABLATION  2000s  . LEFT HEART CATH AND CORONARY ANGIOGRAPHY N/A 06/08/2016   Procedure: Left Heart Cath and Coronary  Angiography;  Surgeon: Belva Crome, MD;  Location: [facility];  Service: Cardiovascular;  Laterality: N/A;  . nasoseptal reconstruction  1990s  . THYROIDECTOMY  1990  . TONSILLECTOMY     as a child  . TUBAL LIGATION  1970s  . uterine mass removal  03/2012   was found to be benign    Family Psychiatric History: no changes  Family History:  Family History  Problem Relation Age of Onset  . Allergies Father   . Skin cancer Father   . Bipolar disorder Father   . Alcohol abuse Father   . Heart disease Mother   . Depression Mother   . Hypertension Mother   . CAD Mother   . Colon cancer Paternal Grandmother   . OCD Paternal Grandmother   . Allergies Sister        multiple  . Allergies Brother        multiple  . Allergies Daughter   . Heart disease Maternal Grandfather   . Cancer Paternal Grandfather     Social History:   Social History   Social History  . Marital status: Single    Spouse name: N/A  . Number of children: N/A  . Years of education: N/A   Occupational History  . 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 Smoking status: Never Smoker  . Smokeless tobacco: Never Used  . Alcohol use No  . Drug use: No  . Sexual activity: Yes    Birth control/ protection: None   Other Topics Concern  . Not on file   Social History Narrative   Divorced and lives alone   Has children    Additional Social History: none  Allergies:   Allergies  Allergen Reactions  . Geodon [Ziprasidone Hydrochloride] Other (See Comments)    Extremely agitated  . Lithium Nausea Only and Other (See Comments)    Off balance, increased heart rate  . Talwin [Pentazocine] Other (See Comments)    Hallucinations   . Toradol [Ketorolac Tromethamine] Other (See Comments)    Chest pains  . Abilify [Aripiprazole] Other (See Comments)    jerking  . Compazine [Prochlorperazine Edisylate] Nausea And Vomiting  . Diflucan [Fluconazole] Nausea And  Vomiting    Metabolic Disorder Labs: No results found for: HGBA1C, MPG No results found for: PROLACTIN Lab Results  Component Value Date   CHOL 167 04/21/2013   TRIG 106 04/21/2013   HDL 44 04/21/2013   CHOLHDL 3.8 04/21/2013   VLDL 21 04/21/2013   LDLCALC 102 (H) 04/21/2013     Current Medications: Current Outpatient Prescriptions  Medication Sig Dispense Refill  . aspirin 81 MG EC tablet Take 1 tablet (81 mg total) by mouth daily. For heart health 30 tablet 1  . beta carotene 25000 UNIT capsule Take 25,000 Units by mouth daily.    Marland Kitchen buPROPion (WELLBUTRIN XL) 150 MG 24 hr tablet Take 3 tablets (450 mg  total) by mouth daily. 270 tablet 0  . BYSTOLIC 2.5 MG tablet TAKE 1 TABLET BY MOUTH DAILY 30 tablet 11  . cholecalciferol (VITAMIN D) 1000 units tablet Take 2,000 Units by mouth daily.    Marland Kitchen dimenhyDRINATE (DRAMAMINE) 50 MG tablet Take 50 mg by mouth every 8 (eight) hours as needed for nausea or dizziness.    . diphenhydrAMINE (BENADRYL) 25 MG tablet Take 25 mg by mouth 2 (two) times daily as needed for allergies.    Marland Kitchen FLUoxetine (PROZAC) 20 MG capsule Take 3 capsules (60 mg total) by mouth daily. 270 capsule 0  . ibuprofen (ADVIL,MOTRIN) 200 MG tablet Take 600-800 mg by mouth 2 (two) times daily as needed (pain).     Marland Kitchen ipratropium (ATROVENT) 0.06 % nasal spray USE 2 SPRAYS IN EACH NOSTRIL 4 TIMES A DAY  0  . lamoTRIgine (LAMICTAL) 200 MG tablet Take 1 tablet (200 mg total) by mouth daily. 90 tablet 0  . levothyroxine (SYNTHROID, LEVOTHROID) 88 MCG tablet Take 1 tablet (88 mcg total) by mouth daily before breakfast. For low thyroid function    . LORazepam (ATIVAN) 1 MG tablet Take 1 tablet (1 mg total) by mouth 2 (two) times daily as needed for anxiety. 60 tablet 1  . losartan-hydrochlorothiazide (HYZAAR) 100-25 MG tablet Take 1 tablet by mouth daily.  2  . Magnesium 500 MG TABS Take 500 mg by mouth daily.    . Melatonin 5 MG CAPS Take 5 mg by mouth at bedtime as needed (sleep).     . methylphenidate (RITALIN) 10 MG tablet Take 1.5 tablets (15 mg total) by mouth daily. 45 tablet 0  . Multiple Vitamins-Minerals (EMERGEN-C VITAMIN C) PACK Take 1 packet by mouth daily.    . Omega-3 1000 MG CAPS Take 1,000 mg by mouth daily.    . Probiotic CAPS Take 3 capsules by mouth daily.    Marland Kitchen rOPINIRole (REQUIP) 0.5 MG tablet TAKE 1 TABLET BY MOUTH IN THE AFTERNOON AND 1 TABLET AT BEDTIME 30 tablet 1  . sodium chloride (OCEAN) 0.65 % SOLN nasal spray Place 1 spray into both nostrils as needed for congestion.     No current facility-administered medications for this visit.     Neurologic: Headache: Negative Seizure: Negative Paresthesias:Negative  Musculoskeletal: Strength & Muscle Tone: within normal limits Gait & Station: normal Patient leans: N/A  Psychiatric Specialty Exam: ROS  There were no vitals taken for this visit.There is no height or weight on file to calculate BMI.  General Appearance: Well Groomed  Eye Contact:  Good  Speech:  Clear and Coherent  Volume:  Normal  Mood:  Anxious and Depressed  Affect:  Congruent  Thought Process:  Coherent and Goal Directed  Orientation:  Full (Time, Place, and Person)  Thought Content:  Logical  Suicidal Thoughts:  No  Homicidal Thoughts:  No  Memory:  Immediate;   Good Recent;   Good Remote;   Good  Judgement:  Good  Insight:  Good  Psychomotor Activity:  Normal  Concentration:  Concentration: Good and Attention Span: Good  Recall:  Good  Fund of Knowledge:Good  Language: Good  Akathisia:  Negative  Handed:  Right  AIMS (if indicated):  0  Assets:  Communication Skills Desire for Improvement Financial Resources/Insurance Physical Health Talents/Skills Transportation Vocational/Educational  ADL's:  Intact  Cognition: WNL  Sleep:  poor    Treatment Plan Summary: Admit to IOP with daily group therapy.  Continue lamotrigine for mood swings and depression.  No  anti depressant as none have helped much in the  past.  Will try a long acting methylphenidate as it should last longer and let her get more done   [name], MD 10/8/201812:07 PM

## 2016-12-28 ENCOUNTER — Other Ambulatory Visit (HOSPITAL_COMMUNITY): Payer: Medicare Other | Admitting: Psychiatry

## 2016-12-28 DIAGNOSIS — F331 Major depressive disorder, recurrent, moderate: Secondary | ICD-10-CM | POA: Diagnosis not present

## 2016-12-28 NOTE — Progress Notes (Signed)
    Daily Group Progress Note  Program: IOP  Group Time: 9:00-12:00  Participation Level: Active  Behavioral Response: Appropriate  Type of Therapy:  Group Therapy  Summary of Progress: Pt. Presents as talkative, engaged in the group process. Pt. Discussed that she struggles to find the motivation to get out of bed, continues to isolate, is sleeping a lot. Pt. Discussed that sleeping feels good to her, even though she has lost many relationships and the ability to engage in many hobbies and interests. Pt. Participated in discussion of importance of engaging in use of medication, change of environment, and use of coping skills. Pt. Participated in grief and loss with the Chaplain.      Nancie Neas, LPC

## 2016-12-28 NOTE — Progress Notes (Signed)
    Daily Group Progress Note  Program: IOP Group Time: 9:00-12:00   Participation Level: Active   Behavioral Response: Appropriate   Type of Therapy:  Group Therapy   Summary of Progress: [name] presented as engaged.  [name] said she is doing well today.  [name] expressed curiosity about the concept of vulnerability-that she typically perceives it as weakness and wondered how it could be a positive quality.  Counselors explained the practices of meditation and mindfulness and how they may be used appropriately in different contexts.  [name] watched Bren Fenix Ruppe's "The Power of Vulnerability" Clare Gandy Talk and engaged in discussion about it.  [name] said she is working on her confidence.   Nancie Neas, LPC

## 2016-12-29 ENCOUNTER — Other Ambulatory Visit (HOSPITAL_COMMUNITY): Payer: Medicare Other | Admitting: Psychiatry

## 2016-12-29 DIAGNOSIS — F331 Major depressive disorder, recurrent, moderate: Secondary | ICD-10-CM | POA: Diagnosis not present

## 2016-12-29 NOTE — Progress Notes (Signed)
    Daily Group Progress Note  Program: IOP Group Time: 9:00-12:00   Participation Level: Active   Behavioral Response: Appropriate   Type of Therapy:  Group Therapy   Summary of Progress: Pt presented with anxious and depressed mood. Pt reports feeling overwhelmed with her life currently and is glad to be in a supportive environment. Pt met with psychiatrist for intake. Pt participated in medication group with psychiatrist. Pt engaged in coping skill discussion and offered support to other group members. Pt denies SI/HI.    Lorin Glass, LCSW

## 2016-12-30 ENCOUNTER — Other Ambulatory Visit (HOSPITAL_COMMUNITY): Payer: Medicare Other

## 2016-12-30 ENCOUNTER — Ambulatory Visit (HOSPITAL_COMMUNITY): Payer: Self-pay | Admitting: Psychiatry

## 2016-12-31 ENCOUNTER — Ambulatory Visit (INDEPENDENT_AMBULATORY_CARE_PROVIDER_SITE_OTHER)
Admission: RE | Admit: 2016-12-31 | Discharge: 2016-12-31 | Disposition: A | Discharge status: Home / Self Care | Payer: Medicare Other | Source: Ambulatory Visit | Attending: Pulmonary Disease | Admitting: Pulmonary Disease

## 2016-12-31 ENCOUNTER — Encounter: Payer: Self-pay | Admitting: Pulmonary Disease

## 2016-12-31 ENCOUNTER — Ambulatory Visit (INDEPENDENT_AMBULATORY_CARE_PROVIDER_SITE_OTHER): Payer: Medicare Other | Admitting: Pulmonary Disease

## 2016-12-31 ENCOUNTER — Other Ambulatory Visit (HOSPITAL_COMMUNITY): Payer: Medicare Other

## 2016-12-31 VITALS — BP 116/72 | HR 77 | Ht 65.0 in | Wt 221.0 lb

## 2016-12-31 DIAGNOSIS — G2581 Restless legs syndrome: Secondary | ICD-10-CM | POA: Diagnosis not present

## 2016-12-31 DIAGNOSIS — R0600 Dyspnea, unspecified: Secondary | ICD-10-CM

## 2016-12-31 DIAGNOSIS — G4733 Obstructive sleep apnea (adult) (pediatric): Secondary | ICD-10-CM

## 2016-12-31 DIAGNOSIS — F411 Generalized anxiety disorder: Secondary | ICD-10-CM | POA: Diagnosis not present

## 2016-12-31 NOTE — Assessment & Plan Note (Signed)
She will restart on CPAP of 9 cm. Prescription has been sent to DME and hopefully machine should be delivered soon. We will either in between 30 and 90 days to check on her compliance again  Weight loss encouraged, compliance with goal of at least 4-6 hrs every night is the expectation. Advised against medications with sedative side effects Cautioned against driving when sleepy - understanding that sleepiness will vary on a day to day basis

## 2016-12-31 NOTE — Assessment & Plan Note (Signed)
File her symptoms of hoarseness and dyspnea are more related to anxiety rather than true asthma.  We'll hold off using bronchodilators that may worsen her anxiety and monitor her symptoms We'll obtain chest x-ray for completion

## 2016-12-31 NOTE — Progress Notes (Signed)
   Subjective:    Patient ID: [name], female    DOB: 1945-12-15, 71 y.o.   MRN: [id number]  HPI  71 year old woman for FU of restless leg syndrome and OSA  She has  bipolar disorder and anxiety On multiple medications. Prozac is being tapered to off.  01/2015  AHI was worse compared to 2004, she was started on CPAP of 9 cm but had poor compliance and this was discontinued by DME She underwent repeat home sleep study 11/2016 which showed AHI of 12/hour and prescription was sent to DME but she has not received her CPAP yet  She has been tapered off Prozac and her restless leg symptoms have improved tremendously, she continues on Requip at bedtime low-dose but is able to sleep on some nights without this.  She reports some hoarseness and wonders if she has asthma, has never needed inhalers of any kind. She also reports heaviness in her chest on occasion and dyspnea on exertion and wonders if this could be related to her weight or to her anxiety. She continues to undergo treatment with psychiatry for anxiety and depression She reports feeling worse when entering her apartment and wonders if this could be related to exposure to mold from her carpets  Spirometry was a poor effort and showed ratio 68, FEV1 of 67% and FVC of 75% suggesting mild obstruction  Significant tests/ events reviewed   NPSG 2004: AHI 10/hr PSG 07/2015 (214 lbs) AHI 23/h, mild PLMs 23/h but no sig arousals  CPAP titration 09/2015 9 cm  HST 11/2016 AHI 12/h, 7h TST  Review of Systems neg for any significant sore throat, dysphagia, itching, sneezing, nasal congestion or excess/ purulent secretions, fever, chills, sweats, unintended wt loss, pleuritic or exertional cp, hempoptysis, orthopnea pnd or change in chronic leg swelling. Also denies presyncope, palpitations, heartburn, abdominal pain, nausea, vomiting, diarrhea or change in bowel or urinary habits, dysuria,hematuria, rash, arthralgias, visual  complaints, headache, numbness weakness or ataxia.     Objective:   Physical Exam  Gen. Pleasant, obese, in no distress ENT - no lesions, no post nasal drip Neck: No JVD, no thyromegaly, no carotid bruits Lungs: no use of accessory muscles, no dullness to percussion, decreased without rales or rhonchi  Cardiovascular: Rhythm regular, heart sounds  normal, no murmurs or gallops, no peripheral edema Musculoskeletal: No deformities, no cyanosis or clubbing , no tremors       Assessment & Plan:

## 2016-12-31 NOTE — Assessment & Plan Note (Signed)
Much improved after stopping Prozac She will continue her Requip 0.5 mg at bedtime

## 2016-12-31 NOTE — Patient Instructions (Addendum)
Good luck with starting CPAP therapy, prescription has been sent to DME.  Chest x-ray today  Your shortness of breath may be related to anxiety

## 2017-01-03 ENCOUNTER — Other Ambulatory Visit (HOSPITAL_COMMUNITY): Payer: Medicare Other | Admitting: Psychiatry

## 2017-01-03 DIAGNOSIS — F331 Major depressive disorder, recurrent, moderate: Secondary | ICD-10-CM | POA: Diagnosis not present

## 2017-01-03 NOTE — Progress Notes (Signed)
    Daily Group Progress Note  Program: IOP  Group Time: 9:00-12:00   Participation Level: Active   Behavioral Response: Appropriate   Type of Therapy:  Group Therapy   Summary of Progress: Pt presented as engaged.  Pt listened to and engaged in presentation from pharmacist about medications.  Pt expressed that she was exhausted and overwhelmed from everything that had happened that weekend.  Pt said she had a reaction to the prednisone shots that she got for her knees on Wednesday.  Pt said that she asked her fiance to do her laundry for her and was exasperated when he called her this morning to say that the colors all mixed together.  Counselors introduced idea of acceptance and having realistic expectations of what others can provide for [place] in relationships.  Pt spoke about how her fiance struggles with mental illness and addiction but she loves him and has empathy for him.  Nancie Neas, LPC
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
## 2017-01-04 ENCOUNTER — Other Ambulatory Visit (HOSPITAL_COMMUNITY): Payer: Medicare Other

## 2017-01-04 ENCOUNTER — Ambulatory Visit (HOSPITAL_COMMUNITY): Payer: Self-pay | Admitting: Psychiatry

## 2017-01-05 ENCOUNTER — Other Ambulatory Visit (HOSPITAL_COMMUNITY): Payer: Medicare Other | Admitting: Psychiatry

## 2017-01-05 DIAGNOSIS — F331 Major depressive disorder, recurrent, moderate: Secondary | ICD-10-CM

## 2017-01-06 ENCOUNTER — Other Ambulatory Visit (HOSPITAL_COMMUNITY): Payer: Medicare Other

## 2017-01-06 ENCOUNTER — Telehealth: Payer: Self-pay | Admitting: Pulmonary Disease

## 2017-01-06 ENCOUNTER — Encounter (HOSPITAL_COMMUNITY): Payer: Self-pay | Admitting: Psychiatry

## 2017-01-06 ENCOUNTER — Ambulatory Visit (INDEPENDENT_AMBULATORY_CARE_PROVIDER_SITE_OTHER): Payer: Medicare Other | Admitting: Psychiatry

## 2017-01-06 DIAGNOSIS — Z79899 Other long term (current) drug therapy: Secondary | ICD-10-CM

## 2017-01-06 DIAGNOSIS — F515 Nightmare disorder: Secondary | ICD-10-CM

## 2017-01-06 DIAGNOSIS — F411 Generalized anxiety disorder: Secondary | ICD-10-CM

## 2017-01-06 DIAGNOSIS — F4310 Post-traumatic stress disorder, unspecified: Secondary | ICD-10-CM | POA: Diagnosis not present

## 2017-01-06 DIAGNOSIS — F3130 Bipolar disorder, current episode depressed, mild or moderate severity, unspecified: Secondary | ICD-10-CM

## 2017-01-06 DIAGNOSIS — Z811 Family history of alcohol abuse and dependence: Secondary | ICD-10-CM | POA: Diagnosis not present

## 2017-01-06 DIAGNOSIS — Z818 Family history of other mental and behavioral disorders: Secondary | ICD-10-CM

## 2017-01-06 MED ORDER — LAMOTRIGINE 25 MG PO TABS: ORAL_TABLET | ORAL | 0 refills | Status: DC

## 2017-01-06 MED ORDER — METHYLPHENIDATE HCL 10 MG PO TABS: 15.0000 mg | ORAL_TABLET | Freq: Every day | ORAL | 0 refills | Status: DC

## 2017-01-06 MED ORDER — LORAZEPAM 1 MG PO TABS: 1.0000 mg | ORAL_TABLET | Freq: Two times a day (BID) | ORAL | 1 refills | Status: DC | PRN

## 2017-01-06 MED ORDER — DESVENLAFAXINE SUCCINATE ER 50 MG PO TB24: 50.0000 mg | ORAL_TABLET | Freq: Every day | ORAL | 1 refills | Status: DC

## 2017-01-06 MED ORDER — DIVALPROEX SODIUM ER 500 MG PO TB24: 500.0000 mg | ORAL_TABLET | Freq: Every day | ORAL | 1 refills | Status: DC

## 2017-01-06 NOTE — Telephone Encounter (Signed)
Left message for patient to call back  

## 2017-01-06 NOTE — Progress Notes (Signed)
[facility] MD/PA/NP OP Progress Note  01/06/2017 3:33 PM [name]  MRN:  [id number]  Chief Complaint:  Chief Complaint    Follow-up     HPI: Pt reports she has periods of time where she is off balance, fall, dropping things. She has dizziness when she lies on her right side. She is going to a neurologist in 3 weeks.   Pt stopped Wellbutrin and Prozac several months ago when she got the results of the genesite therapy.  Pt states she has been very "hypo" because she has been buying a lot more impulsively. This month she has been spent $400 which is a lot for her. She states for the last 3 days she has been feeling good for the first time in several months. Pt denies manic and hypomanic symptoms including periods of decreased need for sleep, increased energy, mood lability, impulsivity and FOI.  Pt states she does not think she was feeling depressed. She states she was though and you could tell she was not engaging in self care or taking care of her apartment. Pt was not bathing or cooking or doing her nails or caring for her clothing properly. She is eating a lot of carbs. Her apartment is a mess. Her motivation is very low. Pt has not been going out much and feels a lot of anxiety when she goes to stores. She is holding on to everything and is unable to determine what she can get rid of. She feels like she is "hoarding" and wants to get rid of things but everything feels important. Sleep is variable but better since she stopped Prozac. Pt denies SI/HI.  Pt states her fiances mental health is declining and she has basically become his caretaker.  Pt's anxiety is mild. She is very irritable. Pt has had a few nightmares. She states she has random intrusive memories.  She has been taking Ativan, Ritalin and Lamictal as prescribed and reports SE of palpitations with Ritalin LA 20mg .  Visit Diagnosis:    ICD-10-CM   1. Bipolar I disorder, most recent episode depressed (HCC) F31.30 lamoTRIgine  (LAMICTAL) 25 MG tablet    divalproex (DEPAKOTE ER) 500 MG 24 hr tablet    desvenlafaxine (PRISTIQ) 50 MG 24 hr tablet    methylphenidate (RITALIN) 10 MG tablet  2. PTSD (post-traumatic stress disorder) F43.10 LORazepam (ATIVAN) 1 MG tablet    desvenlafaxine (PRISTIQ) 50 MG 24 hr tablet  3. GAD (generalized anxiety disorder) F41.1 LORazepam (ATIVAN) 1 MG tablet    desvenlafaxine (PRISTIQ) 50 MG 24 hr tablet      Past Psychiatric History:  Anxiety:Yes Bipolar Disorder:Yes Depression:Yes Mania:Yes Psychosis:No Schizophrenia:No Personality Disorder:No Hospitalization for psychiatric illness:Yes History of Electroconvulsive Shock Therapy:No Prior Suicide Attempts:No  Past Medical History:  Past Medical History:  Diagnosis Date  . Abscess of Bartholin's gland   . Acute vestibular neuronitis    Dr Lucia Gaskins  . ADHD (attention deficit hyperactivity disorder)   . Adverse effect of general anesthetic    felt paralyzed while receiving anesthesia  . Anxiety   . Bipolar 1 disorder ([place])   . CAD (coronary artery disease)   . CAD (coronary artery disease), native coronary artery    cath 05/2016 showing 50-70% stenosis in the mid LAD proximal to the first diagonal and 70-80% small OM1. FFR of LAD  not performed because of difficulty with catheter control from the right radial.   . Chronic kidney disease    kidney cancer- pt states she has elected  to not have it treated.  . Depression    Bipolar disorder/goes to [facility] for meds  . Difficult intubation    told by [facility] that she was hard to intubate 15b yrs ago in [place]- surgery since then no problems  . Dysrhythmia    h/o atrial tachycardia- s/p ablation   . GERD (gastroesophageal reflux disease)   . H/O echocardiogram 07/2012   [place] LVF w grade I siastolic dysfunction   . History of attention deficit disorder   . History of endometriosis   . History of pleural effusion   . Hyperlipidemia LDL goal <70 06/24/2016  .  Hypertension   . 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 Migraines   . Obesity   . OSA (obstructive sleep apnea)    uses oral appliance instead of CPAP  . PONV (postoperative nausea and vomiting)   . PTSD (post-traumatic stress disorder)   . PVC's (premature ventricular contractions)   . Renal mass 02/15/2012  . RLS (restless legs syndrome)    Dr Gwenette Greet  . rt renal ca dx'd 12/2009   no treatment/ no surg  . Vertigo     Past Surgical History:  Procedure Laterality Date  . CARDIAC ELECTROPHYSIOLOGY [place] AND ABLATION  2000s  . LEFT HEART CATH AND CORONARY ANGIOGRAPHY N/A 06/08/2016   Procedure: Left Heart Cath and Coronary Angiography;  Surgeon: Belva Crome, MD;  Location: [facility];  Service: Cardiovascular;  Laterality: N/A;  . nasoseptal reconstruction  1990s  . THYROIDECTOMY  1990  . TONSILLECTOMY     as a child  . TUBAL LIGATION  1970s  . uterine mass removal  03/2012   was found to be benign    Family Psychiatric History: Family History  Problem Relation Age of Onset  . Allergies Father   . Skin cancer Father   . Bipolar disorder Father   . Alcohol abuse Father   . Heart disease Mother   . Depression Mother   . Hypertension Mother   . CAD Mother   . Colon cancer Paternal Grandmother   . OCD Paternal Grandmother   . Allergies Sister        multiple  . Allergies Brother        multiple  . Allergies Daughter   . Heart disease Maternal Grandfather   . Cancer Paternal Grandfather     Social History:  Social History   Social History  . Marital status: Single    Spouse name: N/A  . Number of children: N/A  . Years of education: N/A   Occupational History  . 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 Smoking status: Never Smoker  . Smokeless tobacco: Never Used  . Alcohol use No  . Drug use: No  . Sexual activity: Yes    Birth control/ protection: None   Other Topics Concern   . None   Social History Narrative   Divorced and lives alone   Has children    Allergies:  Allergies  Allergen Reactions  . Geodon [Ziprasidone Hydrochloride] Other (See Comments)    Extremely agitated  . Lithium Nausea Only and Other (See Comments)    Off balance, increased heart rate  . Talwin [Pentazocine] Other (See Comments)    Hallucinations   . Toradol [Ketorolac Tromethamine] Other (See Comments)    Chest pains  . Abilify [Aripiprazole] Other (See Comments)    jerking  . Compazine [  Prochlorperazine Edisylate] Nausea And Vomiting  . Diflucan [Fluconazole] Nausea And Vomiting    Metabolic Disorder Labs: No results found for: HGBA1C, MPG No results found for: PROLACTIN Lab Results  Component Value Date   CHOL 167 04/21/2013   TRIG 106 04/21/2013   HDL 44 04/21/2013   CHOLHDL 3.8 04/21/2013   VLDL 21 04/21/2013   LDLCALC 102 (H) 04/21/2013   Lab Results  Component Value Date   TSH 2.109 04/21/2013   TSH 5.271 (H) 07/01/2012    Therapeutic Level Labs: No results found for: LITHIUM No results found for: VALPROATE No components found for:  CBMZ  Current Medications: Current Outpatient Prescriptions  Medication Sig Dispense Refill  . aspirin 81 MG EC tablet Take 1 tablet (81 mg total) by mouth daily. For heart health 30 tablet 1  . BYSTOLIC 2.5 MG tablet TAKE 1 TABLET BY MOUTH DAILY 30 tablet 11  . cholecalciferol (VITAMIN D) 1000 units tablet Take 2,000 Units by mouth daily.    Marland Kitchen dimenhyDRINATE (DRAMAMINE) 50 MG tablet Take 50 mg by mouth every 8 (eight) hours as needed for nausea or dizziness.    . diphenhydrAMINE (BENADRYL) 25 MG tablet Take 25 mg by mouth 2 (two) times daily as needed for allergies.    Marland Kitchen ibuprofen (ADVIL,MOTRIN) 200 MG tablet Take 600-800 mg by mouth 2 (two) times daily as needed (pain).     Marland Kitchen ipratropium (ATROVENT) 0.06 % nasal spray USE 2 SPRAYS IN EACH NOSTRIL 4 TIMES A DAY  0  . lamoTRIgine (LAMICTAL) 200 MG tablet Take 1 tablet (200  mg total) by mouth daily. (Patient taking differently: Take 150 mg by mouth daily. ) 90 tablet 0  . levothyroxine (SYNTHROID, LEVOTHROID) 88 MCG tablet Take 1 tablet (88 mcg total) by mouth daily before breakfast. For low thyroid function    . LORazepam (ATIVAN) 1 MG tablet Take 1 tablet (1 mg total) by mouth 2 (two) times daily as needed for anxiety. 60 tablet 1  . losartan-hydrochlorothiazide (HYZAAR) 100-25 MG tablet Take 1 tablet by mouth daily.  2  . Magnesium 500 MG TABS Take 500 mg by mouth daily.    . methylphenidate (RITALIN LA) 20 MG 24 hr capsule Take 1 capsule (20 mg total) by mouth daily. 30 capsule 0  . Multiple Vitamins-Minerals (EMERGEN-C VITAMIN C) PACK Take 1 packet by mouth daily.    . Omega-3 1000 MG CAPS Take 1,000 mg by mouth daily.    . Probiotic CAPS Take 3 capsules by mouth daily.    Marland Kitchen rOPINIRole (REQUIP) 0.5 MG tablet TAKE 1 TABLET BY MOUTH IN THE AFTERNOON AND 1 TABLET AT BEDTIME 30 tablet 1  . sodium chloride (OCEAN) 0.65 % SOLN nasal spray Place 1 spray into both nostrils as needed for congestion.     No current facility-administered medications for this visit.      Musculoskeletal: Strength & Muscle Tone: within normal limits Gait & Station: normal Patient leans: N/A  Psychiatric Specialty Exam: Review of Systems  Musculoskeletal: Positive for back pain, falls and joint pain. Negative for neck pain.  Neurological: Positive for dizziness. Negative for tingling, tremors, sensory change and headaches.    Blood pressure 132/74, pulse 69, height 5\' 4"  (1.626 m), weight 216 lb (98 kg).Body mass index is 37.08 kg/m.  General Appearance: Fairly Groomed  Eye Contact:  Good  Speech:  Clear and Coherent and Normal Rate  Volume:  Normal  Mood:  Depressed  Affect:  Congruent  Thought Process:  Coherent  and Descriptions of Associations: Circumstantial  Orientation:  Full (Time, Place, and Person)  Thought Content: Rumination   Suicidal Thoughts:  No  Homicidal  Thoughts:  No  Memory:  Immediate;   Good Recent;   Good Remote;   Good  Judgement:  Good  Insight:  Good  Psychomotor Activity:  Normal  Concentration:  Concentration: Good and Attention Span: Good  Recall:  Good  Fund of Knowledge: Good  Language: Good  Akathisia:  No  Handed:  Right  AIMS (if indicated): not done  Assets:  Communication Skills Desire for Improvement Financial Resources/Insurance Housing Intimacy Leisure Time Transportation  ADL's:  Intact  Cognition: WNL  Sleep:  Poor   Screenings: AUDIT     Admission (Discharged) from 04/26/2013 in [facility] Admission (Discharged) from 06/29/2012 in [facility]  Alcohol Use Disorder Identification Test Final Score (AUDIT)  0  0    PHQ2-9     Counselor from 08/06/2015 in [facility]  PHQ-2 Total Score  6  PHQ-9 Total Score  26       Assessment and Plan: Bipolar disorder-depressed; PTSD; GAD   Medication management with supportive therapy. Risks/benefits and SE of the medication discussed. Pt verbalized understanding and verbal consent obtained for treatment.  Affirm with the patient that the medications are taken as ordered. Patient expressed understanding of how their medications were to be used.   Meds:  Ativan 1mg  po BID prn anxiety Taper off the Lamictal and d/c Start trial of Depakote ER 500mg  po qHS for Bipolar disorder Start trial of Pristiq 50mg  po qD for mood and anxiety D/c Wellbutrin XL  D/c Prozac  Ritalin 15mg  po qD for depression mood augmentation    Labs: none  Therapy: brief supportive therapy provided. Discussed psychosocial stressors in detail.   Pt advised to get a UV spectrum light  Consultations: Encouraged to follow up with therapist Encouraged to follow up with PCP as needed  Pt denies SI and is at an acute low risk for suicide. Patient told to call clinic if any problems occur. Patient advised  to go to ER if they should develop SI/HI, side effects, or if symptoms worsen. Has crisis numbers to call if needed. Pt verbalized understanding.  F/up in 1 months or sooner if needed   [name], MD 01/06/2017, 3:33 PM

## 2017-01-07 ENCOUNTER — Other Ambulatory Visit (HOSPITAL_COMMUNITY): Payer: Medicare Other | Admitting: Psychiatry

## 2017-01-07 DIAGNOSIS — F331 Major depressive disorder, recurrent, moderate: Secondary | ICD-10-CM | POA: Diagnosis not present

## 2017-01-07 DIAGNOSIS — F3130 Bipolar disorder, current episode depressed, mild or moderate severity, unspecified: Secondary | ICD-10-CM

## 2017-01-07 NOTE — Telephone Encounter (Signed)
ATC pt, no answer. Left message for pt to call back.  

## 2017-01-07 NOTE — Telephone Encounter (Signed)
Gave patient her results. She verbalized understanding. Nothing else needed at time of conversation.

## 2017-01-07 NOTE — Telephone Encounter (Signed)
Patient in lobby wanting results -pr

## 2017-01-10 ENCOUNTER — Other Ambulatory Visit (HOSPITAL_COMMUNITY): Payer: Medicare Other | Admitting: Psychiatry

## 2017-01-10 DIAGNOSIS — F331 Major depressive disorder, recurrent, moderate: Secondary | ICD-10-CM | POA: Diagnosis not present

## 2017-01-10 DIAGNOSIS — F3130 Bipolar disorder, current episode depressed, mild or moderate severity, unspecified: Secondary | ICD-10-CM

## 2017-01-10 NOTE — Progress Notes (Signed)
    Daily Group Progress Note  Program: IOP  Group Time: 9:00-12:00   Participation Level: Active   Behavioral Response: Appropriate   Type of Therapy:  Group Therapy   Summary of Progress: Pt presented as engaged.  Pt said that she would like to improve her boundaries.  Pt confessed to the group that she stole something from a store a few days ago, which she reports she has never done before.  Pt said that she feels horrible about it now and doesn't know why she did it.  Pt said the next day, she also took four Ativan pills unintentionally.  Counselor and other group members processed briefly with pt about both experiences.  Pt participated in activity about forgiveness and chose quotes that particularly stood out to her.  Pt participated in guided meditation.  Nancie Neas, LPC

## 2017-01-10 NOTE — Progress Notes (Signed)
    Daily Group Progress Note  Program: IOP  Group Time: 9:00-12:00   Participation Level: Active   Behavioral Response: Agitated, Attention-Seeking   Type of Therapy:  Group Therapy   Summary of Progress: Pt presented as agitated and tearful.  For song check-in, pt chose "I Will Always Love You," because she said she is trying to love herself and her inner child.  Pt said that she has been overthinking a lot lately and feels somewhat manic but also unmotivated and tearful.  Counselor challenged pt to separate her feelings from judgment of those feelings.  Pt watched Bren Chantee Cerino's video on Boundaries and participated in discussion about healthy boundaries.  Pt said she was struggling with the concept of boundaries being inclusionary instead of just exclusionary.  Nancie Neas, LPC

## 2017-01-11 ENCOUNTER — Other Ambulatory Visit (HOSPITAL_COMMUNITY): Payer: Medicare Other

## 2017-01-12 ENCOUNTER — Other Ambulatory Visit (HOSPITAL_COMMUNITY): Payer: Medicare Other | Admitting: Psychiatry

## 2017-01-12 ENCOUNTER — Ambulatory Visit: Payer: Self-pay | Admitting: Cardiology

## 2017-01-12 DIAGNOSIS — F331 Major depressive disorder, recurrent, moderate: Secondary | ICD-10-CM | POA: Diagnosis not present

## 2017-01-12 DIAGNOSIS — F3130 Bipolar disorder, current episode depressed, mild or moderate severity, unspecified: Secondary | ICD-10-CM

## 2017-01-13 ENCOUNTER — Encounter: Payer: Self-pay | Admitting: Cardiology

## 2017-01-13 ENCOUNTER — Other Ambulatory Visit (HOSPITAL_COMMUNITY): Payer: Medicare Other | Admitting: Psychiatry

## 2017-01-13 DIAGNOSIS — F3130 Bipolar disorder, current episode depressed, mild or moderate severity, unspecified: Secondary | ICD-10-CM

## 2017-01-13 DIAGNOSIS — F331 Major depressive disorder, recurrent, moderate: Secondary | ICD-10-CM | POA: Diagnosis not present

## 2017-01-14 ENCOUNTER — Other Ambulatory Visit (HOSPITAL_COMMUNITY): Payer: Medicare Other | Admitting: Psychiatry

## 2017-01-14 DIAGNOSIS — F331 Major depressive disorder, recurrent, moderate: Secondary | ICD-10-CM | POA: Diagnosis not present

## 2017-01-14 DIAGNOSIS — F3130 Bipolar disorder, current episode depressed, mild or moderate severity, unspecified: Secondary | ICD-10-CM

## 2017-01-14 NOTE — Progress Notes (Signed)
    Daily Group Progress Note  Program: IOP  Group Time: 9:00-12:00   Participation Level: Active   Behavioral Response: Appropriate   Type of Therapy:  Group Therapy   Summary of Progress: Pt presented as quiet and somewhat disengaged.  Pt participated in discussion regarding self-image and boundaries.  Pt said she thinks she presents as needy but she is trying to become less of a pushover.  Pt engaged with other group members and provided feedback.  Pt listened to and participated in presentation about Wellness (exercise, nutrition and sleep).  Nancie Neas, LPC

## 2017-01-14 NOTE — Patient Instructions (Signed)
D:  Patient will complete MH-IOP on 01-17-17. (Case Mgr will be off that day).  A:  Follow up with Eloise Levels, PhD on 01-18-17 1:00 pm and Dr. Doyne Keel on 02-03-17 @ 10:45 a.m.  Encouraged support groups.  R:  Pt receptive.

## 2017-01-14 NOTE — Progress Notes (Signed)
    Daily Group Progress Note  Program: IOP  Group Time: 9:00-12:00   Participation Level: Active   Behavioral Response: Appropriate   Type of Therapy:  Group Therapy   Summary of Progress: Pt presented as engaged.  Pt heard and participated in medication management presentation from pharmacist.  Pt said hearing about someone else's experience of ECT made her start thinking about possibly trying to get ECT for herself, which made her feel anxious.  Pt said she took a walk and tried to slow her breathing during the break, which was helpful.  Counselor affirmed pt's use of positive coping skills.  Counselor led group in brief grounding exercise.

## 2017-01-14 NOTE — Progress Notes (Signed)
[name] is a 71 y.o. , divorced, Caucasian female, who was a self referral treatment for worsening depressive and anxiety symptoms with passive SI. Pt denied a plan or intent. Pt is well known to this Probation officer. CC: previous MH-IOP chart/notes for history. Stressors: 1) Conflictual relationship with fiance'. According to pt, they were reunited ~ two ysr ago. "We dated yrs ago." He is dx with Schizo-affective D/O. "He is difficult to deal with at times." Pt states he was hospitalized several times last yr. Pt reports he inherited money from a trust fund. He has asked pt to marry him and she said "yes." According to pt, they will keep separate homes. According to pt, he isn't doing very well.  "He hasn't been seeing his psychiatrist and therapist."  Pt states he made a suicide gesture a couple of weeks by cutting his wrists at her home.  Pt states she continues to worry about her troublesome daughter. "She still doesn't talk to me, nor anyone in my family." Pt also reports she misses her best friend who moved to [place] last yr. 2) Renal Cancer. Pt is refusing any treatment for it. Family hx: Father (ETOH). Pt will completed MH-IOP on 01-17-17 (case mgr will be off on that day).  According to pt, she is feeling "pretty good."  Continues to struggle with being isolative and sleeping throughout the day at times.  States she was able to go out to dinner with a friend yesterday.  "The wellness person who spoke the other day here really gave me the encouragement to exercise."  Pt states she exercised yesterday and danced this morning. Pt denies SI/HI or Visual Hallucinations.  Admits to Auditory Hallucinations.  Reports she's been hearing her daughter calling her name [facility]") and hearing water running while in bed.  Pt states that the pharmacist talked about antipsychotics and she is interested in starting one.  A:  D/C on 01-17-17.  Re-directed pt to discuss medications with Dr. Doyne Keel on  02-03-17.  F/U with Eloise Levels, PhD on 01-18-17.  Discussed possibility of referring pt to [facility], in order to get the assistance she needs.  Writer will need to complete a referral form first.  Made contact with [facility].  They don't accept pt's insurance at this time, but is currently working on getting on the panel.  According to secretary they will do a sliding scale for pt.  Encouraged pt to attend support groups, etc at [facility].  R:  Pt receptive.      Carlis Abbott, RITA, M.Ed, CNA

## 2017-01-14 NOTE — Progress Notes (Signed)
    Daily Group Progress Note  Program: IOP  Group Time: 9:00-12:00  Participation Level: Active  Behavioral Response: Appropriate  Type of Therapy:  Group Therapy  Summary of Progress: Pt.presented as talkative, engaged in the group process. Pt. Participated in discussion about development of self-love, positive body image, use of positive affirmation and self-talk to develop positive self-concept. Pt. Participated in discussion about the use of meditation for emotion regulation and management of overwhelming thoughts.      Nancie Neas, LPC

## 2017-01-17 ENCOUNTER — Other Ambulatory Visit (HOSPITAL_COMMUNITY): Payer: Medicare Other

## 2017-01-18 ENCOUNTER — Other Ambulatory Visit (HOSPITAL_COMMUNITY): Payer: Medicare Other

## 2017-01-18 ENCOUNTER — Ambulatory Visit (HOSPITAL_COMMUNITY): Payer: Self-pay | Admitting: Psychiatry

## 2017-01-18 ENCOUNTER — Ambulatory Visit (INDEPENDENT_AMBULATORY_CARE_PROVIDER_SITE_OTHER): Payer: Medicare Other | Admitting: Psychiatry

## 2017-01-18 DIAGNOSIS — F3130 Bipolar disorder, current episode depressed, mild or moderate severity, unspecified: Secondary | ICD-10-CM

## 2017-01-18 NOTE — Progress Notes (Signed)
    Daily Group Progress Note  Program: IOP  Group Time: 9:00-12:00  Participation Level: Active  Behavioral Response: Appropriate  Type of Therapy:  Group Therapy  Summary of Progress: Pt. actively engaged and stated she was fine.  She went out to dinner with some friends last night and had a good time.  However, she is still having some issues coping with her fiance spending too much time at her place,  ironing his  clothes,  doing chores and keeping things in order at home.  She wants to set boundaries so that he can take on some responsibilities for himself.   The therapist suggested laundry service and In-home Counseling as options that she might want to consider.  Pt. stated she would look into that.  Pt.'s behavioral response was appropriate.       Nancie Neas, LPC

## 2017-01-19 ENCOUNTER — Other Ambulatory Visit (HOSPITAL_COMMUNITY): Payer: Medicare Other

## 2017-01-19 NOTE — Progress Notes (Signed)
   THERAPIST PROGRESS NOTE   Session Time: 1:10-2:00  Participation Level:Active  Behavioral Response:CasualAlertEuthymic  Type of Therapy: Individual Therapy  Treatment Goals addressed: Coping  Interventions:strength-based; solution focused  Summary: [name] a 71 y.o.femalewho presents with major depressive disorder.   Suicidal/Homicidal:No without intent/plan  Therapist Response:Pt. Presented with euthymic mood. Pt. Presents for first session since discharge from mental health IOP. Pt. Did not attend final session of IOP. Pt. Reports that she did not attend the final session because problems with vertigo. Pt. Reports that she has made significant improvements in her self-care and her depression since participation in IOP. Pt. Reports that her boundaries with her boyfriend are improved and she is working on developing greater acceptance of his mental health problems and understanding him as limited emotional support. Pt. Discussed renewed interest in gemstones, crafting, caligraphy, fashion. Pt. Discussed new friendship that developed during IOP, and generally becoming more social and becoming more confident in her social relationships. Pt. Discussed upcoming challenge of the the holiday season and beginning to plan now for having a successful holiday season. Pt. Discussed transitioning to [facility] to work on challenges that she is having in her home and then planning to transition back to individual counseling with current individual counselor in a few months.   Plan: Pt. To follow up with individual counseling with [facility] for in home counseling services within the next 2weeks.  Diagnosis:Depressive Disorder NOS      Nancie Neas, [facility] 01/19/2017

## 2017-01-20 ENCOUNTER — Other Ambulatory Visit (HOSPITAL_COMMUNITY): Payer: Medicare Other

## 2017-01-20 NOTE — Progress Notes (Signed)
Patient ID: VIDYA BAMFORD, female   DOB: [birth year], 71 y.o.   MRN: [id number]  [facility] IOP DISCHARGE NOTE  Patient:  [name] DOB:  [birth year]  Date of Admission: 12/27/2016  Date of Discharge: 01/18/2017  Reason for Admission:depression  IOP Course:attended and participated for the most part.  She was described at discharge as much less depressed and optimistic about her future  Mental Status at Discharge:no suicidal thinkng  Diagnosis: major depression recurrent moderate.  PTSD.  Generalized anxiety disorder.  History of bipolar 1 disorder Level of Care:  IOP  Discharge destination:  Has appointments with her psychiatrist and therapist    Comments:  Mood much improved.  Relationship issues with her female friend seem to be the major issue. No med changes   The patient received suicide prevention pamphlet:  Yes   [name], MD

## 2017-01-21 ENCOUNTER — Other Ambulatory Visit (HOSPITAL_COMMUNITY): Payer: Medicare Other

## 2017-01-24 ENCOUNTER — Other Ambulatory Visit (HOSPITAL_COMMUNITY): Payer: Medicare Other

## 2017-01-25 ENCOUNTER — Other Ambulatory Visit (HOSPITAL_COMMUNITY): Payer: Medicare Other

## 2017-01-25 ENCOUNTER — Ambulatory Visit (HOSPITAL_COMMUNITY): Payer: Self-pay | Admitting: Psychiatry

## 2017-01-26 ENCOUNTER — Other Ambulatory Visit: Payer: Self-pay | Admitting: Cardiology

## 2017-01-26 ENCOUNTER — Encounter: Payer: Self-pay | Admitting: Neurology

## 2017-01-26 ENCOUNTER — Ambulatory Visit (INDEPENDENT_AMBULATORY_CARE_PROVIDER_SITE_OTHER): Payer: Medicare Other | Admitting: Neurology

## 2017-01-26 ENCOUNTER — Telehealth: Payer: Self-pay | Admitting: Pulmonary Disease

## 2017-01-26 VITALS — BP 122/64 | HR 68 | Ht 64.0 in | Wt 220.0 lb

## 2017-01-26 DIAGNOSIS — R413 Other amnesia: Secondary | ICD-10-CM

## 2017-01-26 DIAGNOSIS — R42 Dizziness and giddiness: Secondary | ICD-10-CM

## 2017-01-26 DIAGNOSIS — H81399 Other peripheral vertigo, unspecified ear: Secondary | ICD-10-CM

## 2017-01-26 DIAGNOSIS — E538 Deficiency of other specified B group vitamins: Secondary | ICD-10-CM

## 2017-01-26 HISTORY — DX: Other amnesia: R41.3

## 2017-01-26 NOTE — Telephone Encounter (Signed)
Instructions      Return in about 2 months (around 03/02/2017) for TP.  Good luck with starting CPAP therapy, prescription has been sent to DME.  Chest x-ray today  Your shortness of breath may be related to anxiety     After Visit Summary (Printed 12/31/2016)   Dr. Elsworth Soho, [facility].

## 2017-01-26 NOTE — Patient Instructions (Signed)
   We will get MRI of the brain and get blood work today. 

## 2017-01-26 NOTE — Progress Notes (Signed)
Reason for visit: Vertigo  Referring physician: Dr. Saul Fordyce is a 71 y.o. female  History of present illness:  [name] is a 71 year old right-handed white female with a history of bipolar disorder and obesity.  The patient has come in to the office today for several reasons.  She has a 1 year history of episodic vertigo which occurs when she lays down and then turns her head to the right and slightly upwards.  The patient can get over the vertigo within 1 or 2 minutes if she puts her head into a neutral position and stays still.  She has also noted some mild gait instability when walking, she will stumble on occasion.  She does not have any other symptoms with the dizziness to include headache, hearing changes or ear pain.  The patient has no double vision or slurred speech.  She has reported increased problems with balance, she may stumble on occasion.  The patient also reports some issues with memory, she has had difficulty with misplacing things about the house, she has short-term memory issues.  She has a history of sleep apnea, she more recently has not been on her CPAP as the machine is getting changed.  She has reported sudden episodes of sleep onset while driving and while working at a desk.  The patient is going to be seen by her sleep physician in the next week or so.  She denies any early morning headaches.  She reports no problems with control of the bowels of the bladder.  She does have some back pain and left leg pain, she is getting physical therapy for this.  She has been placed on Depakote recently.  The patient is sent to this office for an evaluation.  Past Medical History:  Diagnosis Date  . Abscess of Bartholin's gland   . Acute vestibular neuronitis    Dr Lucia Gaskins  . ADHD (attention deficit hyperactivity disorder)   . Adverse effect of general anesthetic    felt paralyzed while receiving anesthesia  . Anxiety   . Bipolar 1 disorder ([place])   . CAD  (coronary artery disease)   . CAD (coronary artery disease), native coronary artery    cath 05/2016 showing 50-70% stenosis in the mid LAD proximal to the first diagonal and 70-80% small OM1. FFR of LAD  not performed because of difficulty with catheter control from the right radial.   . Chronic kidney disease    kidney cancer- pt states she has elected to not have it treated.  . Depression    Bipolar disorder/goes to [facility] for meds  . Difficult intubation    told by [facility] that she was hard to intubate 15b yrs ago in [place]- surgery since then no problems  . Dysrhythmia    h/o atrial tachycardia- s/p ablation   . GERD (gastroesophageal reflux disease)   . H/O echocardiogram 07/2012   [place] LVF w grade I siastolic dysfunction   . History of attention deficit disorder   . History of endometriosis   . History of pleural effusion   . Hyperlipidemia LDL goal <70 06/24/2016  . Hypertension   . 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 Migraines   . Obesity   . OSA (obstructive sleep apnea)    uses oral appliance instead of CPAP  . PONV (postoperative nausea and vomiting)   . PTSD (post-traumatic stress disorder)   . PVC's (premature ventricular contractions)   .  Renal mass 02/15/2012  . RLS (restless legs syndrome)    Dr Gwenette Greet  . rt renal ca dx'd 12/2009   no treatment/ no surg  . Vertigo     Past Surgical History:  Procedure Laterality Date  . CARDIAC ELECTROPHYSIOLOGY [place] AND ABLATION  2000s  . nasoseptal reconstruction  1990s  . THYROIDECTOMY  1990  . TONSILLECTOMY     as a child  . TUBAL LIGATION  1970s  . uterine mass removal  03/2012   was found to be benign    Family History  Problem Relation Age of Onset  . Allergies Father   . Skin cancer Father   . Bipolar disorder Father   . Alcohol abuse Father   . Heart disease Mother   . Depression Mother   . Hypertension Mother   . CAD Mother   . Colon cancer Paternal Grandmother     . OCD Paternal Grandmother   . Allergies Sister        multiple  . Allergies Brother        multiple  . Allergies Daughter   . Heart disease Maternal Grandfather   . Cancer Paternal Grandfather     Social history:  reports that  has never smoked. she has never used smokeless tobacco. She reports that she drinks alcohol. She reports that she does not use drugs.  Medications:  Prior to Admission medications   Medication Sig Start Date End Date Taking? Authorizing Provider  aspirin 81 MG EC tablet Take 1 tablet (81 mg total) by mouth daily. For heart health 05/08/13  Yes Lindell Spar I, NP  BYSTOLIC 2.5 MG tablet TAKE 1 TABLET BY MOUTH DAILY 01/16/16  Yes Turner, Eber Hong, MD  cholecalciferol (VITAMIN D) 1000 units tablet Take 2,000 Units by mouth daily.   Yes [provider]  desvenlafaxine (PRISTIQ) 50 MG 24 hr tablet Take 1 tablet (50 mg total) by mouth daily. 01/06/17  Yes [name], MD  dimenhyDRINATE (DRAMAMINE) 50 MG tablet Take 50 mg by mouth every 8 (eight) hours as needed for nausea or dizziness.   Yes [provider]  diphenhydrAMINE (BENADRYL) 25 MG tablet Take 25 mg by mouth 2 (two) times daily as needed for allergies.   Yes [provider]  ibuprofen (ADVIL,MOTRIN) 200 MG tablet Take 600-800 mg by mouth 2 (two) times daily as needed (pain).    Yes [provider]  ipratropium (ATROVENT) 0.06 % nasal spray USE 2 SPRAYS IN EACH NOSTRIL 4 TIMES A DAY prn 09/05/16  Yes [provider]  levothyroxine (SYNTHROID, LEVOTHROID) 88 MCG tablet Take 1 tablet (88 mcg total) by mouth daily before breakfast. For low thyroid function 05/08/13  Yes Nwoko, Agnes I, NP  LORazepam (ATIVAN) 1 MG tablet Take 1 tablet (1 mg total) by mouth 2 (two) times daily as needed for anxiety. 01/06/17 01/06/18 Yes [name], MD  losartan-hydrochlorothiazide (HYZAAR) 100-25 MG tablet Take 1 tablet by mouth daily. 10/25/16  Yes [provider]  Magnesium  500 MG TABS Take 500 mg by mouth daily.   Yes [provider]  methylphenidate (RITALIN) 10 MG tablet Take 1.5 tablets (15 mg total) by mouth daily. Patient taking differently: Take 15 mg daily by mouth. Pt reports she takes 15mg -20mg  daily 01/06/17 01/06/18 Yes [name], MD  Multiple Vitamins-Minerals (EMERGEN-C VITAMIN C) PACK Take 1 packet by mouth daily.   Yes [provider]  Omega-3 1000 MG CAPS Take 1,000 mg by mouth daily.  Yes [provider]  Probiotic CAPS Take 3 capsules by mouth daily.   Yes [provider]  rOPINIRole (REQUIP) 0.5 MG tablet TAKE 1 TABLET BY MOUTH IN THE AFTERNOON AND 1 TABLET AT BEDTIME 11/05/16  Yes Rigoberto Noel, MD  sodium chloride (OCEAN) 0.65 % SOLN nasal spray Place 1 spray into both nostrils as needed for congestion.   Yes [provider]  divalproex (DEPAKOTE ER) 500 MG 24 hr tablet Take 1 tablet (500 mg total) by mouth daily. Patient not taking: Reported on 01/26/2017 01/06/17   [name], MD      Allergies  Allergen Reactions  . Geodon [Ziprasidone Hydrochloride] Other (See Comments)    Extremely agitated  . Lithium Nausea Only and Other (See Comments)    Off balance, increased heart rate  . Talwin [Pentazocine] Other (See Comments)    Hallucinations   . Toradol [Ketorolac Tromethamine] Other (See Comments)    Chest pains  . Abilify [Aripiprazole] Other (See Comments)    jerking  . Compazine [Prochlorperazine Edisylate] Nausea And Vomiting  . Diflucan [Fluconazole] Nausea And Vomiting    ROS:  Out of a complete 14 system review of symptoms, the patient complains only of the following symptoms, and all other reviewed systems are negative.  Fatigue Shortness of breath Feeling hot Joint pain Memory loss, confusion, dizziness Depression, anxiety, too much sleep, decreased energy, change in appetite, disinterest in activities, racing thoughts Restless legs  Blood pressure 122/64,  pulse 68, height 5\' 4"  (1.626 m), weight 220 lb (99.8 kg).  Physical Exam  General: The patient is alert and cooperative at the time of the examination.  The patient is markedly obese.  Eyes: Pupils are equal, round, and reactive to light. Discs are flat bilaterally.   Ears: Tympanic membranes are clear bilaterally.  Neck: The neck is supple, no carotid bruits are noted.  Respiratory: The respiratory examination is clear.  Cardiovascular: The cardiovascular examination reveals a regular rate and rhythm, no obvious murmurs or rubs are noted.  Skin: Extremities are without significant edema.  Neurologic Exam  Mental status: The patient is alert and oriented x 3 at the time of the examination. The patient has apparent normal recent and remote memory, with an apparently normal attention span and concentration ability.  Mini-Mental status examination done today shows a total score of 28/30.  Cranial nerves: Facial symmetry is present. There is good sensation of the face to pinprick and soft touch bilaterally. The strength of the facial muscles and the muscles to head turning and shoulder shrug are normal bilaterally. Speech is well enunciated, no aphasia or dysarthria is noted. Extraocular movements are full. Visual fields are full. The tongue is midline, and the patient has symmetric elevation of the soft palate. No obvious hearing deficits are noted.  Motor: The motor testing reveals 5 over 5 strength of all 4 extremities. Good symmetric motor tone is noted throughout.  Sensory: Sensory testing is intact to pinprick, soft touch, vibration sensation, and position sense on all 4 extremities. No evidence of extinction is noted.  Coordination: Cerebellar testing reveals good finger-nose-finger and heel-to-shin bilaterally. The Nyan-Barrany procedure was done, the patient reports some dizziness with lying back, but no nystagmus was seen during symptomatic periods.  A mild intention tremor seen  with finger-nose-finger bilaterally.  Gait and station: Gait is normal. Tandem gait is slightly unsteady. Romberg is negative. No drift is seen.  Reflexes: Deep tendon reflexes are symmetric and normal bilaterally. Toes are downgoing bilaterally.  Assessment/Plan:  1.  Vertigo, possible positional vertigo  2.  Report of memory disorder  3.  Sleep apnea, excessive daytime drowsiness  The patient will be sent for MRI evaluation of the brain.  Blood work will be done today.  We will follow the memory issues over time.  If the MRI of the brain is relatively unremarkable, we may consider vestibular rehabilitation.  The patient will follow-up in 6 months.   Jill Alexanders MD 01/26/2017 10:00 AM  [facility] [address] [place], [place] [address]  Phone [phone] Fax [phone]

## 2017-01-26 NOTE — Telephone Encounter (Signed)
lmomtcb x 1 for the pt to call us back .

## 2017-01-26 NOTE — Telephone Encounter (Signed)
Please call patient and reschedule appointment and let her know that she needs a face-to-face otherwise CPAP machine will be taken away again -

## 2017-01-27 ENCOUNTER — Other Ambulatory Visit (HOSPITAL_COMMUNITY): Payer: Medicare Other

## 2017-01-27 LAB — VITAMIN B12: Vitamin B-12: 802 pg/mL (ref 232–1245)

## 2017-01-27 LAB — AMMONIA: Ammonia: 67 ug/dL (ref 19–87)

## 2017-01-27 LAB — HIV ANTIBODY (ROUTINE TESTING W REFLEX): HIV Screen 4th Generation wRfx: NONREACTIVE

## 2017-01-27 LAB — RPR: RPR Ser Ql: NONREACTIVE

## 2017-01-27 LAB — SEDIMENTATION RATE: Sed Rate: 12 mm/hr (ref 0–40)

## 2017-01-28 ENCOUNTER — Other Ambulatory Visit (HOSPITAL_COMMUNITY): Payer: Medicare Other

## 2017-01-28 NOTE — Telephone Encounter (Signed)
lmomtcb x 2  

## 2017-01-31 ENCOUNTER — Other Ambulatory Visit (HOSPITAL_COMMUNITY): Payer: Self-pay | Admitting: Psychiatry

## 2017-01-31 ENCOUNTER — Other Ambulatory Visit (HOSPITAL_COMMUNITY): Payer: Medicare Other

## 2017-01-31 DIAGNOSIS — F3130 Bipolar disorder, current episode depressed, mild or moderate severity, unspecified: Secondary | ICD-10-CM

## 2017-01-31 NOTE — Telephone Encounter (Signed)
lmtcb x3 for pt. 

## 2017-02-01 ENCOUNTER — Other Ambulatory Visit (HOSPITAL_COMMUNITY): Payer: Medicare Other

## 2017-02-01 NOTE — Telephone Encounter (Signed)
lmtcb X4 for pt.  Pt has OV on 12/12 with TP.

## 2017-02-02 ENCOUNTER — Other Ambulatory Visit (HOSPITAL_COMMUNITY): Payer: Medicare Other

## 2017-02-02 NOTE — Telephone Encounter (Signed)
Closing msg per protocol

## 2017-02-03 ENCOUNTER — Other Ambulatory Visit (HOSPITAL_COMMUNITY): Payer: Medicare Other

## 2017-02-03 ENCOUNTER — Ambulatory Visit (INDEPENDENT_AMBULATORY_CARE_PROVIDER_SITE_OTHER): Payer: Medicare Other | Admitting: Psychiatry

## 2017-02-03 ENCOUNTER — Encounter (HOSPITAL_COMMUNITY): Payer: Self-pay | Admitting: Psychiatry

## 2017-02-03 DIAGNOSIS — F4310 Post-traumatic stress disorder, unspecified: Secondary | ICD-10-CM

## 2017-02-03 DIAGNOSIS — Z818 Family history of other mental and behavioral disorders: Secondary | ICD-10-CM

## 2017-02-03 DIAGNOSIS — F3130 Bipolar disorder, current episode depressed, mild or moderate severity, unspecified: Secondary | ICD-10-CM

## 2017-02-03 DIAGNOSIS — M2550 Pain in unspecified joint: Secondary | ICD-10-CM

## 2017-02-03 DIAGNOSIS — R460 Very low level of personal hygiene: Secondary | ICD-10-CM

## 2017-02-03 DIAGNOSIS — F411 Generalized anxiety disorder: Secondary | ICD-10-CM

## 2017-02-03 DIAGNOSIS — Z560 Unemployment, unspecified: Secondary | ICD-10-CM

## 2017-02-03 DIAGNOSIS — Z811 Family history of alcohol abuse and dependence: Secondary | ICD-10-CM | POA: Diagnosis not present

## 2017-02-03 MED ORDER — LORAZEPAM 1 MG PO TABS: 1.0000 mg | ORAL_TABLET | Freq: Two times a day (BID) | ORAL | 0 refills | Status: DC | PRN

## 2017-02-03 MED ORDER — DIVALPROEX SODIUM ER 250 MG PO TB24: 750.0000 mg | ORAL_TABLET | Freq: Every day | ORAL | 0 refills | Status: DC

## 2017-02-03 MED ORDER — DESVENLAFAXINE SUCCINATE ER 100 MG PO TB24: 100.0000 mg | ORAL_TABLET | Freq: Every day | ORAL | 0 refills | Status: DC

## 2017-02-03 NOTE — Progress Notes (Signed)
[facility] MD/PA/NP OP Progress Note  02/03/2017 11:18 AM [name]  MRN:  [id number]  Chief Complaint:  Chief Complaint    Follow-up     HPI: Pt states she is the same as her last visit. Pt states she is severely depressed. She is crying, sad, unmotivated and fatigued. She is not taking care of her personal hygiene, her cat or her apartment. Pt is not able to concentrate. Pt states she has not social support especially from her fiance. Her problems are compounded by her fiance's mental health. Sleep is fair and she is spending all day in bed. Pt denies SI/HI.  Pt denies manic and hypomanic symptoms including periods of decreased need for sleep, increased energy, mood lability, impulsivity, FOI, and excessive spending.  Pt states-taking meds as prescribed and denies SE.    Visit Diagnosis:    ICD-10-CM   1. Bipolar I disorder, most recent episode depressed (HCC) F31.30 desvenlafaxine (PRISTIQ) 100 MG 24 hr tablet    divalproex (DEPAKOTE ER) 250 MG 24 hr tablet  2. PTSD (post-traumatic stress disorder) F43.10 desvenlafaxine (PRISTIQ) 100 MG 24 hr tablet    LORazepam (ATIVAN) 1 MG tablet  3. GAD (generalized anxiety disorder) F41.1 desvenlafaxine (PRISTIQ) 100 MG 24 hr tablet    LORazepam (ATIVAN) 1 MG tablet    Past Psychiatric History:  Anxiety: Yes Bipolar Disorder: Yes Depression: Yes Mania: Yes Psychosis: No Schizophrenia: No Personality Disorder: No Hospitalization for psychiatric illness: Yes History of Electroconvulsive Shock Therapy: No Prior Suicide Attempts: No   Past Medical History:  Past Medical History:  Diagnosis Date  . Abscess of Bartholin's gland   . Acute vestibular neuronitis    Dr Lucia Gaskins  . ADHD (attention deficit hyperactivity disorder)   . Adverse effect of general anesthetic    felt paralyzed while receiving anesthesia  . Anxiety   . Bipolar 1 disorder ([place])   . CAD (coronary artery disease)   . CAD (coronary artery disease), native coronary  artery    cath 05/2016 showing 50-70% stenosis in the mid LAD proximal to the first diagonal and 70-80% small OM1. FFR of LAD  not performed because of difficulty with catheter control from the right radial.   . Chronic kidney disease    kidney cancer- pt states she has elected to not have it treated.  . Depression    Bipolar disorder/goes to [facility] for meds  . Difficult intubation    told by [employer] that she was hard to intubate 15b yrs ago in [place]- surgery since then no problems  . Dysrhythmia    h/o atrial tachycardia- s/p ablation   . GERD (gastroesophageal reflux disease)   . H/O echocardiogram 07/2012   Normla LVF w grade I siastolic dysfunction   . History of attention deficit disorder   . History of endometriosis   . History of pleural effusion   . Hyperlipidemia LDL goal <70 06/24/2016  . Hypertension   . 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 Memory difficulty 01/26/2017  . Migraines   . Obesity   . OSA (obstructive sleep apnea)    uses oral appliance instead of CPAP  . PONV (postoperative nausea and vomiting)   . PTSD (post-traumatic stress disorder)   . PVC's (premature ventricular contractions)   . Renal mass 02/15/2012  . RLS (restless legs syndrome)    Dr Gwenette Greet  . rt renal ca dx'd 12/2009   no treatment/ no surg  . Vertigo  Past Surgical History:  Procedure Laterality Date  . CARDIAC ELECTROPHYSIOLOGY [place] AND ABLATION  2000s  . LEFT HEART CATH AND CORONARY ANGIOGRAPHY N/A 06/08/2016   Procedure: Left Heart Cath and Coronary Angiography;  Surgeon: Belva Crome, MD;  Location: [facility];  Service: Cardiovascular;  Laterality: N/A;  . nasoseptal reconstruction  1990s  . THYROIDECTOMY  1990  . TONSILLECTOMY     as a child  . TUBAL LIGATION  1970s  . uterine mass removal  03/2012   was found to be benign    Family Psychiatric History:  Family History  Problem Relation Age of Onset  . Allergies Father   .  Skin cancer Father   . Bipolar disorder Father   . Alcohol abuse Father   . Heart disease Mother   . Depression Mother   . Hypertension Mother   . CAD Mother   . Colon cancer Paternal Grandmother   . OCD Paternal Grandmother   . Allergies Sister        multiple  . Allergies Brother        multiple  . Allergies Daughter   . Heart disease Maternal Grandfather   . Cancer Paternal Grandfather     Social History:  Social History   Socioeconomic History  . Marital status: Single    Spouse name: None  . Number of children: 1  . Years of education: 22  . Highest education level: None  Social Needs  . Financial resource strain: None  . Food insecurity - worry: None  . Food insecurity - inability: None  . Transportation needs - medical: None  . Transportation needs - non-medical: None  Occupational History  . 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 Smoking status: Never Smoker  . Smokeless tobacco: Never Used  Substance and Sexual Activity  . Alcohol use: Yes    Comment: rare  . Drug use: No  . Sexual activity: Yes    Birth control/protection: None  Other Topics Concern  . None  Social History Narrative   Divorced and lives alone   Has children   Caffeine use: none   Right handed     Allergies:  Allergies  Allergen Reactions  . Geodon [Ziprasidone Hydrochloride] Other (See Comments)    Extremely agitated  . Lithium Nausea Only and Other (See Comments)    Off balance, increased heart rate  . Talwin [Pentazocine] Other (See Comments)    Hallucinations   . Toradol [Ketorolac Tromethamine] Other (See Comments)    Chest pains  . Abilify [Aripiprazole] Other (See Comments)    jerking  . Compazine [Prochlorperazine Edisylate] Nausea And Vomiting  . Diflucan [Fluconazole] Nausea And Vomiting    Metabolic Disorder Labs: No results found for: HGBA1C, MPG No results found for: PROLACTIN Lab Results  Component Value Date   CHOL 167  04/21/2013   TRIG 106 04/21/2013   HDL 44 04/21/2013   CHOLHDL 3.8 04/21/2013   VLDL 21 04/21/2013   LDLCALC 102 (H) 04/21/2013   Lab Results  Component Value Date   TSH 2.109 04/21/2013   TSH 5.271 (H) 07/01/2012    Therapeutic Level Labs: No results found for: LITHIUM No results found for: VALPROATE No components found for:  CBMZ  Current Medications: Current Outpatient Medications  Medication Sig Dispense Refill  . aspirin 81 MG EC tablet Take 1 tablet (81 mg total) by mouth daily. For heart health 30 tablet 1  . BYSTOLIC 2.5 MG tablet  TAKE 1 TABLET BY MOUTH DAILY 30 tablet 4  . cholecalciferol (VITAMIN D) 1000 units tablet Take 2,000 Units by mouth daily.    Marland Kitchen desvenlafaxine (PRISTIQ) 50 MG 24 hr tablet Take 1 tablet (50 mg total) by mouth daily. 50 tablet 1  . dimenhyDRINATE (DRAMAMINE) 50 MG tablet Take 50 mg by mouth every 8 (eight) hours as needed for nausea or dizziness.    . diphenhydrAMINE (BENADRYL) 25 MG tablet Take 25 mg by mouth 2 (two) times daily as needed for allergies.    Marland Kitchen divalproex (DEPAKOTE ER) 500 MG 24 hr tablet Take 1 tablet (500 mg total) by mouth daily. 30 tablet 1  . ibuprofen (ADVIL,MOTRIN) 200 MG tablet Take 600-800 mg by mouth 2 (two) times daily as needed (pain).     Marland Kitchen levothyroxine (SYNTHROID, LEVOTHROID) 88 MCG tablet Take 1 tablet (88 mcg total) by mouth daily before breakfast. For low thyroid function    . LORazepam (ATIVAN) 1 MG tablet Take 1 tablet (1 mg total) by mouth 2 (two) times daily as needed for anxiety. 60 tablet 1  . losartan-hydrochlorothiazide (HYZAAR) 100-25 MG tablet Take 1 tablet by mouth daily.  2  . Magnesium 500 MG TABS Take 500 mg by mouth daily.    . Multiple Vitamins-Minerals (EMERGEN-C VITAMIN C) PACK Take 1 packet by mouth daily.    . Omega-3 1000 MG CAPS Take 1,000 mg by mouth daily.    . Probiotic CAPS Take 3 capsules by mouth daily.    Marland Kitchen rOPINIRole (REQUIP) 0.5 MG tablet TAKE 1 TABLET BY MOUTH IN THE AFTERNOON AND 1  TABLET AT BEDTIME 30 tablet 1  . sodium chloride (OCEAN) 0.65 % SOLN nasal spray Place 1 spray into both nostrils as needed for congestion.    Marland Kitchen ipratropium (ATROVENT) 0.06 % nasal spray USE 2 SPRAYS IN EACH NOSTRIL 4 TIMES A DAY prn  0  . methylphenidate (RITALIN) 10 MG tablet Take 1.5 tablets (15 mg total) by mouth daily. (Patient not taking: Reported on 02/03/2017) 45 tablet 0   No current facility-administered medications for this visit.      Musculoskeletal: Strength & Muscle Tone: within normal limits Gait & Station: normal Patient leans: N/A  Psychiatric Specialty Exam: Review of Systems  Constitutional: Positive for malaise/fatigue. Negative for chills and fever.  Musculoskeletal: Positive for joint pain. Negative for back pain, myalgias and neck pain.  Neurological: Negative for weakness.    Blood pressure 128/86, pulse 76, height 5\' 4"  (1.626 m), weight 223 lb (101.2 kg).Body mass index is 38.28 kg/m.  General Appearance: Disheveled  Eye Contact:  Good  Speech:  Clear and Coherent and Normal Rate  Volume:  Decreased  Mood:  Depressed  Affect:  Depressed and Tearful  Thought Process:  Goal Directed and Descriptions of Associations: Intact  Orientation:  Full (Time, Place, and Person)  Thought Content: Logical   Suicidal Thoughts:  No  Homicidal Thoughts:  No  Memory:  Immediate;   Fair Recent;   Poor Remote;   Fair  Judgement:  Fair  Insight:  Fair  Psychomotor Activity:  Normal  Concentration:  Concentration: Poor and Attention Span: Poor  Recall:  Poor  Fund of Knowledge: Fair  Language: Fair  Akathisia:  No  Handed:  Right  AIMS (if indicated): not done  Assets:  Communication Skills Desire for Improvement Housing Transportation  ADL's:  Intact  Cognition: WNL  Sleep:  Good   Screenings: AUDIT     Admission (Discharged) from 04/26/2013  in [facility] Admission (Discharged) from 06/29/2012 in [facility]  Alcohol Use Disorder Identification Test Final Score (AUDIT)  0  0    Mini-Mental     Office Visit from 01/26/2017 in [facility]  Total Score (max 30 points )  28    PHQ2-9     Counselor from 08/06/2015 in [facility]  PHQ-2 Total Score  6  PHQ-9 Total Score  26       Assessment and Plan: Bipolar disorder- depressed; PTSD; GAD    Medication management with supportive therapy. Risks/benefits and SE of the medication discussed. Pt verbalized understanding and verbal consent obtained for treatment.  Affirm with the patient that the medications are taken as ordered. Patient expressed understanding of how their medications were to be used.   Meds:  Ativan 1mg  po BID prn anxiety Increase Depakote ER 750mg  po qHS for Bipolar disorder Increase Pristiq 100mg  po qD for mood and anxiety D/c Ritalin  Again recommended pt buy a UV light machine  Labs: none  Therapy: brief supportive therapy provided. Discussed psychosocial stressors in detail.   Encouraged pt to develop daily routine and work on daily goal setting as a way to improve mood symptoms.    Consultations: Encouraged to follow up with therapist- helped her set up appointment for this Monday Encouraged to follow up with PCP as needed  Pt denies SI and is at an acute low risk for suicide. Patient told to call clinic if any problems occur. Patient advised to go to ER if they should develop SI/HI, side effects, or if symptoms worsen. Has crisis numbers to call if needed. Pt verbalized understanding.  F/up in 2 weeks or sooner if needed    [name], MD 02/03/2017, 11:18 AM

## 2017-02-04 ENCOUNTER — Other Ambulatory Visit (HOSPITAL_COMMUNITY): Payer: Medicare Other

## 2017-02-05 ENCOUNTER — Other Ambulatory Visit: Payer: Self-pay | Admitting: Pulmonary Disease

## 2017-02-07 ENCOUNTER — Other Ambulatory Visit (HOSPITAL_COMMUNITY): Payer: Medicare Other

## 2017-02-07 ENCOUNTER — Ambulatory Visit (HOSPITAL_COMMUNITY): Payer: Self-pay | Admitting: Psychiatry

## 2017-02-08 ENCOUNTER — Other Ambulatory Visit (HOSPITAL_COMMUNITY): Payer: Medicare Other

## 2017-02-09 ENCOUNTER — Other Ambulatory Visit (HOSPITAL_COMMUNITY): Payer: Medicare Other

## 2017-02-11 ENCOUNTER — Other Ambulatory Visit (HOSPITAL_COMMUNITY): Payer: Medicare Other

## 2017-02-12 ENCOUNTER — Other Ambulatory Visit: Payer: Self-pay

## 2017-02-14 ENCOUNTER — Other Ambulatory Visit (HOSPITAL_COMMUNITY): Payer: Medicare Other

## 2017-02-15 ENCOUNTER — Other Ambulatory Visit (HOSPITAL_COMMUNITY): Payer: Medicare Other

## 2017-02-15 NOTE — Addendum Note (Signed)
Addended by: Eloise Levels B on: 02/15/2017 04:36 PM   Modules accepted: Level of Service

## 2017-02-16 ENCOUNTER — Ambulatory Visit: Payer: Self-pay | Admitting: Oncology

## 2017-02-17 ENCOUNTER — Other Ambulatory Visit (HOSPITAL_COMMUNITY): Payer: Medicare Other

## 2017-02-17 ENCOUNTER — Ambulatory Visit (HOSPITAL_COMMUNITY): Payer: Self-pay | Admitting: Psychiatry

## 2017-02-23 MED ORDER — NEBIVOLOL HCL 2.5 MG PO TABS: 2.5000 mg | ORAL_TABLET | Freq: Every day | ORAL | 0 refills | Status: DC

## 2017-02-23 NOTE — Addendum Note (Signed)
Addended by: Juventino Slovak on: 02/23/2017 03:44 PM   Modules accepted: Orders

## 2017-02-24 ENCOUNTER — Ambulatory Visit (HOSPITAL_COMMUNITY): Payer: Self-pay | Admitting: Psychiatry

## 2017-03-02 ENCOUNTER — Ambulatory Visit: Payer: Self-pay | Admitting: Adult Health

## 2017-03-03 ENCOUNTER — Encounter (HOSPITAL_COMMUNITY): Payer: Self-pay | Admitting: Psychiatry

## 2017-03-03 ENCOUNTER — Other Ambulatory Visit (HOSPITAL_COMMUNITY): Payer: Self-pay | Admitting: Psychiatry

## 2017-03-03 ENCOUNTER — Ambulatory Visit (INDEPENDENT_AMBULATORY_CARE_PROVIDER_SITE_OTHER): Payer: Medicare Other | Admitting: Psychiatry

## 2017-03-03 DIAGNOSIS — F4310 Post-traumatic stress disorder, unspecified: Secondary | ICD-10-CM

## 2017-03-03 DIAGNOSIS — Z560 Unemployment, unspecified: Secondary | ICD-10-CM

## 2017-03-03 DIAGNOSIS — Z811 Family history of alcohol abuse and dependence: Secondary | ICD-10-CM

## 2017-03-03 DIAGNOSIS — F411 Generalized anxiety disorder: Secondary | ICD-10-CM

## 2017-03-03 DIAGNOSIS — F3130 Bipolar disorder, current episode depressed, mild or moderate severity, unspecified: Secondary | ICD-10-CM

## 2017-03-03 DIAGNOSIS — Z818 Family history of other mental and behavioral disorders: Secondary | ICD-10-CM | POA: Diagnosis not present

## 2017-03-03 MED ORDER — BREXPIPRAZOLE 0.5 MG PO TABS: 0.5000 mg | ORAL_TABLET | Freq: Every day | ORAL | 0 refills | Status: DC

## 2017-03-03 MED ORDER — LORAZEPAM 1 MG PO TABS: 1.0000 mg | ORAL_TABLET | Freq: Two times a day (BID) | ORAL | 0 refills | Status: DC | PRN

## 2017-03-03 MED ORDER — DIVALPROEX SODIUM ER 250 MG PO TB24: 750.0000 mg | ORAL_TABLET | Freq: Every day | ORAL | 0 refills | Status: DC

## 2017-03-03 NOTE — Progress Notes (Signed)
[facility] [facility] MD/PA/NP OP Progress Note  03/03/2017 3:09 PM [name]  MRN:  [id number]  Chief Complaint:  Chief Complaint    Follow-up; Depression; Anxiety     HPI: Pt reports depression is significantly worse. Pt has been lying in bed all day and night. She has no energy and is sleeping all the time. She has no motivation and is endorsing anhedonia. She is feeling hopeless. Pt doesn't go anywhere and has cancelled many doctors appointments. Pt has on/off passive thoughts of death and the worst was a few days ago. She can see more and more how people commit suicide. Pt denies suicidal plan or intent. She feels that no one would miss her.  Pt denies manic and hypomanic symptoms including periods of decreased need for sleep, increased energy, mood lability, impulsivity, FOI, and excessive spending.  Pt denies any nightmares related to her trauma. Pt denies other symptoms of PTSD.  Anxiety is mild and she is overall feeling numb.  Pt states-taking meds as prescribed and denies SE.   Visit Diagnosis:    ICD-10-CM   1. Bipolar I disorder, most recent episode depressed (HCC) F31.30 divalproex (DEPAKOTE ER) 250 MG 24 hr tablet    Brexpiprazole (REXULTI) 0.5 MG TABS  2. PTSD (post-traumatic stress disorder) F43.10 LORazepam (ATIVAN) 1 MG tablet  3. GAD (generalized anxiety disorder) F41.1 LORazepam (ATIVAN) 1 MG tablet      Past Psychiatric History:  Anxiety: Yes Bipolar Disorder: Yes Depression: Yes Mania: Yes Psychosis: No Schizophrenia: No Personality Disorder: No Hospitalization for psychiatric illness: Yes History of Electroconvulsive Shock Therapy: No Prior Suicide Attempts: No   Past Medical History:  Past Medical History:  Diagnosis Date  . Abscess of Bartholin's gland   . Acute vestibular neuronitis    Dr Lucia Gaskins  . ADHD (attention deficit hyperactivity disorder)   . Adverse effect of general anesthetic    felt paralyzed while receiving anesthesia  . Anxiety   .  Bipolar 1 disorder ([place])   . CAD (coronary artery disease)   . CAD (coronary artery disease), native coronary artery    cath 05/2016 showing 50-70% stenosis in the mid LAD proximal to the first diagonal and 70-80% small OM1. FFR of LAD  not performed because of difficulty with catheter control from the right radial.   . Chronic kidney disease    kidney cancer- pt states she has elected to not have it treated.  . Depression    Bipolar disorder/goes to [facility] for meds  . Difficult intubation    told by [facility] that she was hard to intubate 15b yrs ago in [place]- surgery since then no problems  . Dysrhythmia    h/o atrial tachycardia- s/p ablation   . GERD (gastroesophageal reflux disease)   . H/O echocardiogram 07/2012   [place] LVF w grade I siastolic dysfunction   . History of attention deficit disorder   . History of endometriosis   . History of pleural effusion   . Hyperlipidemia LDL goal <70 06/24/2016  . Hypertension   . 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 Memory difficulty 01/26/2017  . Migraines   . Obesity   . OSA (obstructive sleep apnea)    uses oral appliance instead of CPAP  . PONV (postoperative nausea and vomiting)   . PTSD (post-traumatic stress disorder)   . PVC's (premature ventricular contractions)   . Renal mass 02/15/2012  . RLS (restless legs syndrome)    Dr Gwenette Greet  .  rt renal ca dx'd 12/2009   no treatment/ no surg  . Vertigo     Past Surgical History:  Procedure Laterality Date  . CARDIAC ELECTROPHYSIOLOGY [place] AND ABLATION  2000s  . LEFT HEART CATH AND CORONARY ANGIOGRAPHY N/A 06/08/2016   Procedure: Left Heart Cath and Coronary Angiography;  Surgeon: Belva Crome, MD;  Location: [facility];  Service: Cardiovascular;  Laterality: N/A;  . nasoseptal reconstruction  1990s  . THYROIDECTOMY  1990  . TONSILLECTOMY     as a child  . TUBAL LIGATION  1970s  . uterine mass removal  03/2012   was found to be benign     Family Psychiatric History:  Family History  Problem Relation Age of Onset  . Allergies Father   . Skin cancer Father   . Bipolar disorder Father   . Alcohol abuse Father   . Heart disease Mother   . Depression Mother   . Hypertension Mother   . CAD Mother   . Colon cancer Paternal Grandmother   . OCD Paternal Grandmother   . Allergies Sister        multiple  . Allergies Brother        multiple  . Allergies Daughter   . Heart disease Maternal Grandfather   . Cancer Paternal Grandfather     Social History:  Social History   Socioeconomic History  . Marital status: Single    Spouse name: None  . Number of children: 1  . Years of education: 29  . Highest education level: None  Social Needs  . Financial resource strain: None  . Food insecurity - worry: None  . Food insecurity - inability: None  . Transportation needs - medical: None  . Transportation needs - non-medical: None  Occupational History  . 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 Smoking status: Never Smoker  . Smokeless tobacco: Never Used  Substance and Sexual Activity  . Alcohol use: Yes    Alcohol/week: 0.6 oz    Types: 1 Glasses of wine per week  . Drug use: No  . Sexual activity: Yes    Partners: Male    Birth control/protection: None  Other Topics Concern  . None  Social History Narrative   Divorced and lives alone   Has children   Caffeine use: none   Right handed     Allergies:  Allergies  Allergen Reactions  . Geodon [Ziprasidone Hydrochloride] Other (See Comments)    Extremely agitated  . Lithium Nausea Only and Other (See Comments)    Off balance, increased heart rate  . Talwin [Pentazocine] Other (See Comments)    Hallucinations   . Toradol [Ketorolac Tromethamine] Other (See Comments)    Chest pains  . Abilify [Aripiprazole] Other (See Comments)    jerking  . Compazine [Prochlorperazine Edisylate] Nausea And Vomiting  . Diflucan [Fluconazole]  Nausea And Vomiting    Metabolic Disorder Labs: No results found for: HGBA1C, MPG No results found for: PROLACTIN Lab Results  Component Value Date   CHOL 167 04/21/2013   TRIG 106 04/21/2013   HDL 44 04/21/2013   CHOLHDL 3.8 04/21/2013   VLDL 21 04/21/2013   LDLCALC 102 (H) 04/21/2013   Lab Results  Component Value Date   TSH 2.109 04/21/2013   TSH 5.271 (H) 07/01/2012    Therapeutic Level Labs: No results found for: LITHIUM No results found for: VALPROATE No components found for:  CBMZ  Current Medications: Current Outpatient  Medications  Medication Sig Dispense Refill  . aspirin 81 MG EC tablet Take 1 tablet (81 mg total) by mouth daily. For heart health 30 tablet 1  . cholecalciferol (VITAMIN D) 1000 units tablet Take 2,000 Units by mouth daily.    Marland Kitchen desvenlafaxine (PRISTIQ) 100 MG 24 hr tablet Take 1 tablet (100 mg total) daily by mouth. 30 tablet 0  . dimenhyDRINATE (DRAMAMINE) 50 MG tablet Take 50 mg by mouth every 8 (eight) hours as needed for nausea or dizziness.    . diphenhydrAMINE (BENADRYL) 25 MG tablet Take 25 mg by mouth 2 (two) times daily as needed for allergies.    Marland Kitchen divalproex (DEPAKOTE ER) 250 MG 24 hr tablet Take 3 tablets (750 mg total) daily by mouth. 90 tablet 0  . ibuprofen (ADVIL,MOTRIN) 200 MG tablet Take 600-800 mg by mouth 2 (two) times daily as needed (pain).     Marland Kitchen ipratropium (ATROVENT) 0.06 % nasal spray USE 2 SPRAYS IN EACH NOSTRIL 4 TIMES A DAY prn  0  . levothyroxine (SYNTHROID, LEVOTHROID) 88 MCG tablet Take 1 tablet (88 mcg total) by mouth daily before breakfast. For low thyroid function    . LORazepam (ATIVAN) 1 MG tablet Take 1 tablet (1 mg total) 2 (two) times daily as needed by mouth for anxiety. 60 tablet 0  . losartan-hydrochlorothiazide (HYZAAR) 100-25 MG tablet Take 1 tablet by mouth daily.  2  . Magnesium 500 MG TABS Take 500 mg by mouth daily.    . Multiple Vitamins-Minerals (EMERGEN-C VITAMIN C) PACK Take 1 packet by mouth  daily.    . nebivolol (BYSTOLIC) 2.5 MG tablet Take 1 tablet (2.5 mg total) by mouth daily. 90 tablet 0  . Omega-3 1000 MG CAPS Take 1,000 mg by mouth daily.    . Probiotic CAPS Take 3 capsules by mouth daily.    Marland Kitchen rOPINIRole (REQUIP) 0.5 MG tablet TAKE 1 TABLET BY MOUTH IN THE AFTERNOON AND 1 TABLET AT BEDTIME 30 tablet 1  . sodium chloride (OCEAN) 0.65 % SOLN nasal spray Place 1 spray into both nostrils as needed for congestion.     No current facility-administered medications for this visit.      Musculoskeletal: Strength & Muscle Tone: within normal limits Gait & Station: normal Patient leans: N/A  Psychiatric Specialty Exam: Review of Systems  Constitutional: Negative for chills and fever.  HENT: Negative for congestion, ear pain, sinus pain and sore throat.   Neurological: Negative for weakness.    Blood pressure 128/72, height 5\' 4"  (1.626 m), weight 222 lb (100.7 kg).Body mass index is 38.11 kg/m.  General Appearance: Casual  Eye Contact:  Good  Speech:  Clear and Coherent and Normal Rate  Volume:  Normal  Mood:  Anxious and Depressed  Affect:  Congruent, Depressed and Tearful  Thought Process:  Goal Directed and Descriptions of Associations: Intact  Orientation:  Full (Time, Place, and Person)  Thought Content: Logical   Suicidal Thoughts:  No last time was a few days ago  Homicidal Thoughts:  No  Memory:  Immediate;   Good Recent;   Good Remote;   Good  Judgement:  Good  Insight:  Good  Psychomotor Activity:  Normal  Concentration:  Concentration: Good and Attention Span: Good  Recall:  Good  Fund of Knowledge: Good  Language: Good  Akathisia:  No  Handed:  Right  AIMS (if indicated): not done  Assets:  Communication Skills Desire for Improvement Housing  ADL's:  Intact  Cognition: WNL  Sleep:  Fair   Screenings: AUDIT     Admission (Discharged) from 04/26/2013 in [facility] Admission (Discharged) from 06/29/2012 in  [facility]  Alcohol Use Disorder Identification Test Final Score (AUDIT)  0  0    Mini-Mental     Office Visit from 01/26/2017 in [facility]  Total Score (max 30 points )  28    PHQ2-9     Counselor from 08/06/2015 in [place]  PHQ-2 Total Score  6  PHQ-9 Total Score  26       Assessment and Plan: Bipolar disorder- depressed; PTSD; GAD    Medication management with supportive therapy. Risks/benefits and SE of the medication discussed. Pt verbalized understanding and verbal consent obtained for treatment.  Affirm with the patient that the medications are taken as ordered. Patient expressed understanding of how their medications were to be used.   Meds:  Ativan 1mg  po BID prn anxiety Depakote ER 750mg  po qHS for Bipolar disorder D/c Pristiq  Start Rexulti 0.5mg  po qD for mood  Again recommended pt buy a UV light machine Pt declined voluntary inpatient psych admission  Labs: 01/26/17: [facility]  Therapy: brief supportive therapy provided. Discussed psychosocial stressors in detail.   Encouraged pt to develop daily routine and work on daily goal setting as a way to improve mood symptoms.    Consultations: Encouraged to follow up with therapist Encouraged to follow up with PCP as needed  Pt denies SI and is at an acute low risk for suicide. Patient told to call clinic if any problems occur. Patient advised to go to ER if they should develop SI/HI, side effects, or if symptoms worsen. Has crisis numbers to call if needed. Pt verbalized understanding.  F/up in 1 months or sooner if needed    [name], MD 03/03/2017, 3:09 PM

## 2017-03-08 ENCOUNTER — Telehealth: Payer: Self-pay | Admitting: Pulmonary Disease

## 2017-03-08 NOTE — Telephone Encounter (Signed)
Spoke with pt. She was wanting to know if she needs to be seen. Pt had an appointment on 03/02/17 but due to the weather she didn't keep the OV. This had already been rescheduled to 03/30/17. Pt apologized that she didn't remember this had been done. Nothing further was needed.

## 2017-03-09 ENCOUNTER — Ambulatory Visit (INDEPENDENT_AMBULATORY_CARE_PROVIDER_SITE_OTHER): Payer: Medicare Other | Admitting: Psychiatry

## 2017-03-09 DIAGNOSIS — F329 Major depressive disorder, single episode, unspecified: Secondary | ICD-10-CM | POA: Diagnosis not present

## 2017-03-09 DIAGNOSIS — F3130 Bipolar disorder, current episode depressed, mild or moderate severity, unspecified: Secondary | ICD-10-CM

## 2017-03-10 NOTE — Progress Notes (Signed)
   THERAPIST PROGRESS NOTE   Session Time: 1:10-2:00  Participation Level:Active  Behavioral Response:CasualAlertEuthymic  Type of Therapy: Individual Therapy  Treatment Goals addressed: Coping  Interventions:strength-based; solution focused  Summary: [name] a 71 y.o.femalewho presents with major depressive disorder.   Suicidal/Homicidal:No without intent/plan  Therapist Response:Pt. Presented with euthymic mood, talkative, engaged in the therapy process. Pt. Reports that she has participated in individual in-home therapy since our last session and it has been helpful for her. Pt. Reports that she has changed medication for her depression and she has noticed improvement in her depression over the last week. Pt. Also reports that her blood pressure has been elevated 150/95 which she attributes to the medication and that her sleep has been interrupted, up every few hours and she has been waking at 9:00 am which she understands is a positive change that she will be able to adjust to. Pt. Reports that she is not anxious at night, is calm and comfortable in her bed, "I just don't want to go to sleep". Pt. Was encouraged to get out of bed and work on other creative projects that she now has energy to start I.e., beading and caligraphy. Pt. Also reports that she has been on new C-PAP machine for last month and that she is thinking more clearly and has more energy. Another significant improvement is that Pt. Stopped her prozac and now has relief from her restless leg syndrome. Pt. Brought in old family pictures of herself from early childhood, wedding day from her second marriage. Pt. Was able to see herself as "open", "courageous", and "strong" and continues to see these attributes in herself today. Pt. Also brought in pictures of her grandchildren both of whom are estranged. Pt. Discussed her heart's desire to reunite with these children. Pt. Received encouragement from the  therapist to communicate her desire to reconnect without expectation for their response. Pt. Discussed that she felt that she was strong enough to do this. Pt. Discussed her plans to go to [facility] for the Christmas holiday with her boyfriend and discussed greater acceptance of his mental illness.    Plan: Pt. To follow up with individual counseling in this office after completing final session with [facility] after the first of the year.  Diagnosis:Depressive Disorder NOS    Nancie Neas, [facility] 03/10/2017

## 2017-03-12 ENCOUNTER — Telehealth: Payer: Self-pay | Admitting: Oncology

## 2017-03-12 NOTE — Telephone Encounter (Signed)
Called patient and unable to leave message due to mailbox being full. Mailed Calendar to patient/ per sched msg 12/21

## 2017-03-16 DIAGNOSIS — I10 Essential (primary) hypertension: Secondary | ICD-10-CM | POA: Insufficient documentation

## 2017-03-16 DIAGNOSIS — G2581 Restless legs syndrome: Secondary | ICD-10-CM | POA: Insufficient documentation

## 2017-03-16 DIAGNOSIS — F329 Major depressive disorder, single episode, unspecified: Secondary | ICD-10-CM | POA: Insufficient documentation

## 2017-03-16 DIAGNOSIS — F319 Bipolar disorder, unspecified: Secondary | ICD-10-CM | POA: Insufficient documentation

## 2017-03-16 DIAGNOSIS — F32A Depression, unspecified: Secondary | ICD-10-CM | POA: Insufficient documentation

## 2017-03-16 DIAGNOSIS — I2510 Atherosclerotic heart disease of native coronary artery without angina pectoris: Secondary | ICD-10-CM | POA: Insufficient documentation

## 2017-03-16 DIAGNOSIS — Z8659 Personal history of other mental and behavioral disorders: Secondary | ICD-10-CM | POA: Insufficient documentation

## 2017-03-16 DIAGNOSIS — H8120 Vestibular neuronitis, unspecified ear: Secondary | ICD-10-CM | POA: Insufficient documentation

## 2017-03-16 DIAGNOSIS — Z9889 Other specified postprocedural states: Secondary | ICD-10-CM | POA: Insufficient documentation

## 2017-03-16 DIAGNOSIS — T41205A Adverse effect of unspecified general anesthetics, initial encounter: Secondary | ICD-10-CM | POA: Insufficient documentation

## 2017-03-16 DIAGNOSIS — I499 Cardiac arrhythmia, unspecified: Secondary | ICD-10-CM | POA: Insufficient documentation

## 2017-03-16 DIAGNOSIS — Z8709 Personal history of other diseases of the respiratory system: Secondary | ICD-10-CM | POA: Insufficient documentation

## 2017-03-16 DIAGNOSIS — Z8742 Personal history of other diseases of the female genital tract: Secondary | ICD-10-CM | POA: Insufficient documentation

## 2017-03-16 DIAGNOSIS — K219 Gastro-esophageal reflux disease without esophagitis: Secondary | ICD-10-CM | POA: Insufficient documentation

## 2017-03-16 DIAGNOSIS — F419 Anxiety disorder, unspecified: Secondary | ICD-10-CM | POA: Insufficient documentation

## 2017-03-16 DIAGNOSIS — N189 Chronic kidney disease, unspecified: Secondary | ICD-10-CM | POA: Insufficient documentation

## 2017-03-16 DIAGNOSIS — F909 Attention-deficit hyperactivity disorder, unspecified type: Secondary | ICD-10-CM | POA: Insufficient documentation

## 2017-03-16 DIAGNOSIS — G43909 Migraine, unspecified, not intractable, without status migrainosus: Secondary | ICD-10-CM | POA: Insufficient documentation

## 2017-03-16 DIAGNOSIS — R112 Nausea with vomiting, unspecified: Secondary | ICD-10-CM | POA: Insufficient documentation

## 2017-03-16 DIAGNOSIS — N751 Abscess of Bartholin's gland: Secondary | ICD-10-CM | POA: Insufficient documentation

## 2017-03-18 ENCOUNTER — Telehealth: Payer: Self-pay | Admitting: Pulmonary Disease

## 2017-03-18 NOTE — Telephone Encounter (Signed)
Called spoke with patient who stated she does not need to speak with Cherina, she would like to leave message for Cherina and Dr. Elsworth Soho:  Pt has stopped her Paxil and this has greatly improved her restless legs.  Because her RLS is decreased, she has restarted CPAP and is beginning to see improvement.  Pt very happy with progress so far.  FYI for RA Will sign and forward

## 2017-03-28 ENCOUNTER — Ambulatory Visit (INDEPENDENT_AMBULATORY_CARE_PROVIDER_SITE_OTHER): Payer: Medicare Other | Admitting: Psychiatry

## 2017-03-28 ENCOUNTER — Other Ambulatory Visit: Payer: Self-pay | Admitting: Orthopedic Surgery

## 2017-03-28 DIAGNOSIS — F3130 Bipolar disorder, current episode depressed, mild or moderate severity, unspecified: Secondary | ICD-10-CM

## 2017-03-28 DIAGNOSIS — M7061 Trochanteric bursitis, right hip: Secondary | ICD-10-CM

## 2017-03-28 DIAGNOSIS — M25561 Pain in right knee: Secondary | ICD-10-CM

## 2017-03-29 ENCOUNTER — Ambulatory Visit
Admission: RE | Admit: 2017-03-29 | Discharge: 2017-03-29 | Disposition: A | Discharge status: Home / Self Care | Payer: Medicare Other | Source: Ambulatory Visit | Attending: Orthopedic Surgery | Admitting: Orthopedic Surgery

## 2017-03-29 DIAGNOSIS — M7061 Trochanteric bursitis, right hip: Secondary | ICD-10-CM

## 2017-03-29 DIAGNOSIS — M25561 Pain in right knee: Secondary | ICD-10-CM

## 2017-03-30 ENCOUNTER — Telehealth: Payer: Self-pay | Admitting: Adult Health

## 2017-03-30 ENCOUNTER — Ambulatory Visit: Payer: Self-pay | Admitting: Adult Health

## 2017-03-30 ENCOUNTER — Ambulatory Visit (HOSPITAL_COMMUNITY)
Admission: RE | Admit: 2017-03-30 | Discharge: 2017-03-30 | Disposition: A | Discharge status: Home / Self Care | Payer: Medicare Other | Source: Home / Self Care | Attending: Psychiatry | Admitting: Psychiatry

## 2017-03-30 ENCOUNTER — Emergency Department (HOSPITAL_COMMUNITY)
Admission: EM | Admit: 2017-03-30 | Discharge: 2017-03-31 | Disposition: A | Discharge status: Other Acute Inpatient Hospital | Payer: Medicare Other | Attending: Emergency Medicine | Admitting: Emergency Medicine

## 2017-03-30 ENCOUNTER — Other Ambulatory Visit: Payer: Self-pay

## 2017-03-30 ENCOUNTER — Encounter (HOSPITAL_COMMUNITY): Payer: Self-pay

## 2017-03-30 DIAGNOSIS — N189 Chronic kidney disease, unspecified: Secondary | ICD-10-CM | POA: Insufficient documentation

## 2017-03-30 DIAGNOSIS — I2510 Atherosclerotic heart disease of native coronary artery without angina pectoris: Secondary | ICD-10-CM | POA: Diagnosis not present

## 2017-03-30 DIAGNOSIS — Z1330 Encounter for screening examination for mental health and behavioral disorders, unspecified: Secondary | ICD-10-CM | POA: Insufficient documentation

## 2017-03-30 DIAGNOSIS — F322 Major depressive disorder, single episode, severe without psychotic features: Secondary | ICD-10-CM

## 2017-03-30 DIAGNOSIS — R110 Nausea: Secondary | ICD-10-CM | POA: Diagnosis not present

## 2017-03-30 DIAGNOSIS — F332 Major depressive disorder, recurrent severe without psychotic features: Secondary | ICD-10-CM | POA: Diagnosis not present

## 2017-03-30 DIAGNOSIS — Z818 Family history of other mental and behavioral disorders: Secondary | ICD-10-CM | POA: Diagnosis not present

## 2017-03-30 DIAGNOSIS — Z049 Encounter for examination and observation for unspecified reason: Secondary | ICD-10-CM

## 2017-03-30 DIAGNOSIS — Z7982 Long term (current) use of aspirin: Secondary | ICD-10-CM | POA: Diagnosis not present

## 2017-03-30 DIAGNOSIS — F411 Generalized anxiety disorder: Secondary | ICD-10-CM | POA: Diagnosis present

## 2017-03-30 DIAGNOSIS — Z046 Encounter for general psychiatric examination, requested by authority: Secondary | ICD-10-CM | POA: Insufficient documentation

## 2017-03-30 DIAGNOSIS — R45851 Suicidal ideations: Secondary | ICD-10-CM | POA: Insufficient documentation

## 2017-03-30 DIAGNOSIS — F419 Anxiety disorder, unspecified: Secondary | ICD-10-CM | POA: Diagnosis not present

## 2017-03-30 DIAGNOSIS — I130 Hypertensive heart and chronic kidney disease with heart failure and stage 1 through stage 4 chronic kidney disease, or unspecified chronic kidney disease: Secondary | ICD-10-CM | POA: Diagnosis not present

## 2017-03-30 DIAGNOSIS — Z811 Family history of alcohol abuse and dependence: Secondary | ICD-10-CM | POA: Diagnosis not present

## 2017-03-30 DIAGNOSIS — F329 Major depressive disorder, single episode, unspecified: Secondary | ICD-10-CM | POA: Diagnosis present

## 2017-03-30 DIAGNOSIS — Z560 Unemployment, unspecified: Secondary | ICD-10-CM | POA: Diagnosis not present

## 2017-03-30 DIAGNOSIS — I5030 Unspecified diastolic (congestive) heart failure: Secondary | ICD-10-CM | POA: Insufficient documentation

## 2017-03-30 DIAGNOSIS — E039 Hypothyroidism, unspecified: Secondary | ICD-10-CM | POA: Insufficient documentation

## 2017-03-30 DIAGNOSIS — Z85520 Personal history of malignant carcinoid tumor of kidney: Secondary | ICD-10-CM | POA: Diagnosis not present

## 2017-03-30 DIAGNOSIS — Z79899 Other long term (current) drug therapy: Secondary | ICD-10-CM | POA: Insufficient documentation

## 2017-03-30 NOTE — ED Triage Notes (Signed)
Accompanied to [facility] ED w/ [facility] Staff member, pt states that she is requesting medication to address her Major Depression Disorder. Pt states that her routine meds for this were discontinued this past Monday. Pt denies SI/HI/AH/VH. Pt A+OX4, denies physiological pain, ambulatory independently to triage.

## 2017-03-30 NOTE — ED Notes (Signed)
Bed: WTR9 Expected date:  Expected time:  Means of arrival:  Comments: 

## 2017-03-30 NOTE — Telephone Encounter (Signed)
Pt returning phone call. Cb is [phone].

## 2017-03-30 NOTE — Progress Notes (Signed)
   THERAPIST PROGRESS NOTE  Session Time: 2:10-3:00  Participation Level:Active  Behavioral Response:CasualAlertDepressedTearful  Type of Therapy: Individual Therapy  Treatment Goals addressed: Coping  Interventions: solution focused  Summary: [name] a 72 y.o.femalewho presents with bipolar disorder   Suicidal/Homicidal:No without intent/plan  Therapist Response:Pt. Presents tearful, shaking, very depressed with her boyfriend. Pt. Reports multiple physical an emotional symptoms that she attributes to her recent medication change. Pt. States that she has hip, knee, and shoulder pain and can hardly walk, difficulty getting in and out of bed and car and had been using a cane. Pt. States that since starting rexulti her ankles and hands have been swollen, her BP has been high, she has developing a clicking with her tongue and teeth. In the first two weeks depression seemed to lessen and felt almost euphoric, but in the last week noticed a significant decline such that she cannot sleep, wakes up with anxiety, very short of breath. Pt. Reports that she is very irritable, that she is always hungry, she is cursing, yelling, has had to apologize to neighbors for her mood and temper, and that she has drunk 3 bottles of wine which is out of character for her. Counselor consulted with Beather Arbour, RN who contacted Dr. Darrall Dears and recommended that Pt. Begin full, immediate taper off of the rexulti. Pt. Was advised to go the ER or to call the office back for an earlier appointment if her symptoms worsen.   Plan:Pt. To return in two weeks for follow up appointment.  Diagnosis:Bipolar Disorder  Nancie Neas, [facility] 03/30/2017

## 2017-03-30 NOTE — Telephone Encounter (Signed)
Attempted to contact pt. No answer, no option to leave a message due to pt's voicemail being full. Will try back.

## 2017-03-30 NOTE — H&P (Signed)
Behavioral Health Medical Screening Exam  [name] is an 72 y.o. female presenting to [facility] as a walk-in, endorsing pervasive depressive symptoms, coupled with daily alcohol consumption x several weeks duration. Her symptoms followed a brief period of endorsed mania around Thanksgiving Holiday. She endorses hypersomnolence, fatigue, lack of motivation, not bathing, eating, or leaving her home. She denies SI with plan, intent or means, but she wants "God to take her life" She has been compliant with her medications, but reportedly had a reaction with Rexulti, which she had been taking for a brief period of time.  Total Time spent with patient: 20 minutes  Psychiatric Specialty Exam: Physical Exam  Constitutional: She is oriented to person, place, and time. She appears well-developed and well-nourished. No distress.  HENT:  Head: Normocephalic.  Eyes: Pupils are equal, round, and reactive to light.  Cardiovascular: Normal rate and regular rhythm.  Respiratory: Effort normal and breath sounds normal. No respiratory distress.  Neurological: She is alert and oriented to person, place, and time. No cranial nerve deficit.  Skin: Skin is warm and dry. She is not diaphoretic.  Psychiatric: Her speech is normal. Judgment normal. She is withdrawn. Cognition and memory are normal. She exhibits a depressed mood. She expresses suicidal ideation.    Review of Systems  Constitutional: Negative for chills, fever and weight loss.  Neurological: Positive for weakness.  Psychiatric/Behavioral: Positive for depression, substance abuse and suicidal ideas. The patient has insomnia.     There were no vitals taken for this visit.There is no height or weight on file to calculate BMI.  General Appearance: Disheveled  Eye Contact:  Fair  Speech:  Normal Rate  Volume:  Normal  Mood:  Depressed  Affect:  Congruent  Thought Process:  Goal Directed  Orientation:  Full (Time, Place, and Person)  Thought  Content:  Logical  Suicidal Thoughts:  Yes.  without intent/plan  Homicidal Thoughts:  No  Memory:  Immediate;   Fair  Judgement:  Fair  Insight:  Fair  Psychomotor Activity:  Negative  Concentration: Concentration: Fair  Recall:  [employer] of Knowledge:Fair  Language: Fair  Akathisia:  Negative  Handed:  Right  AIMS (if indicated):     Assets:  Desire for Improvement  Sleep:       Musculoskeletal: Strength & Muscle Tone: within normal limits Gait & Station: normal Patient leans: N/A  There were no vitals taken for this visit.  Recommendations:  Based on my evaluation the patient appears to have an emergency medical condition for which I recommend the patient be transferred to the emergency department for further evaluation.  Laverle Hobby, PA-C 03/30/2017, 10:37 PM

## 2017-03-30 NOTE — BH Assessment (Signed)
Assessment Note  [name] is an 72 y.o. female presents to [facility] with fiance and voluntarily. Pt reports depression progressively getting worse. Pt reports she is sleeping a lot and "I feel peaceful when I am asleep and wish I could go to sleep and just not wake up". Pt reports a hx of depression and SI. Pt reports he is "struggling to take care of myself, I don't bath, clean, I can't do my laundry, this just isn't me". Pt denies homicidal thoughts or physical aggression. Pt denies having access to firearms. Pt denies having any legal problems at this time. Pt denies hallucinations. Pt does not appear to be responding to internal stimuli and exhibits no delusional thought. Pt's reality testing appears to be intact. Pt reports she is drinking more than normal. She drank a bottle of wine in just a few days.   Pt is unclear of stressors. Pt lives alone but is engaged. Pt has outpatient services with [facility], Brooke. Volanda Frank and [name]. Pt is dressed in street clothes, alert, oriented x4 with normal speech and normal motor behavior. Eye contact is good and Pt is tearful. Pt's mood is depressed and affect is anxious. Thought process is coherent and relevant. Pt's insight is fair and judgement is impaired. There is no indication Pt is currently responding to internal stimuli or experiencing delusional thought content. Pt was cooperative throughout assessment. She says she is willing to sign voluntarily into a psychiatric facility.    Diagnosis:  F32.2 Major depressive disorder, Single episode, Severe   Past Medical History:  Past Medical History:  Diagnosis Date  . Abscess of Bartholin's gland   . Acute vestibular neuronitis    [name]  . ADHD (attention deficit hyperactivity disorder)   . Adverse effect of general anesthetic    felt paralyzed while receiving anesthesia  . Anxiety   . Bipolar 1 disorder ([place])   . CAD (coronary artery disease)   . CAD (coronary artery disease), native  coronary artery    cath 05/2016 showing 50-70% stenosis in the mid LAD proximal to the first diagonal and 70-80% small OM1. FFR of LAD  not performed because of difficulty with catheter control from the right radial.   . Chronic kidney disease    kidney cancer- pt states she has elected to not have it treated.  . Depression    Bipolar disorder/goes to [facility] for meds  . Difficult intubation    told by [facility] that she was hard to intubate 15b yrs ago in [place]- surgery since then no problems  . Dysrhythmia    h/o atrial tachycardia- s/p ablation   . GERD (gastroesophageal reflux disease)   . H/O echocardiogram 07/2012   Normla LVF w grade I siastolic dysfunction   . History of attention deficit disorder   . History of endometriosis   . History of pleural effusion   . Hyperlipidemia LDL goal <70 06/24/2016  . Hypertension   . 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 Memory difficulty 01/26/2017  . Migraines   . Obesity   . OSA (obstructive sleep apnea)    uses oral appliance instead of CPAP  . PONV (postoperative nausea and vomiting)   . PTSD (post-traumatic stress disorder)   . PVC's (premature ventricular contractions)   . Renal mass 02/15/2012  . RLS (restless legs syndrome)    Brooke Gwenette Greet  . rt renal ca dx'd 12/2009   no treatment/ no surg  . Vertigo  Past Surgical History:  Procedure Laterality Date  . CARDIAC ELECTROPHYSIOLOGY [place] AND ABLATION  2000s  . LEFT HEART CATH AND CORONARY ANGIOGRAPHY N/A 06/08/2016   Procedure: Left Heart Cath and Coronary Angiography;  Surgeon: Belva Crome, MD;  Location: [place] [facility];  Service: Cardiovascular;  Laterality: N/A;  . nasoseptal reconstruction  1990s  . THYROIDECTOMY  1990  . TONSILLECTOMY     as a child  . TUBAL LIGATION  1970s  . uterine mass removal  03/2012   was found to be benign    Family History:  Family History  Problem Relation Age of Onset  . Allergies Father   . Skin  cancer Father   . Bipolar disorder Father   . Alcohol abuse Father   . Heart disease Mother   . Depression Mother   . Hypertension Mother   . CAD Mother   . Colon cancer Paternal Grandmother   . OCD Paternal Grandmother   . Allergies Sister        multiple  . Allergies Brother        multiple  . Allergies Daughter   . Heart disease Maternal Grandfather   . Cancer Paternal Grandfather     Social History:  reports that  has never smoked. she has never used smokeless tobacco. She reports that she drinks about 0.6 oz of alcohol per week. She reports that she does not use drugs.  Additional Social History:  Alcohol / Drug Use Pain Medications: See MAR Prescriptions: See MAR Over the Counter: See MAR History of alcohol / drug use?: No history of alcohol / drug abuse  CIWA:   COWS:    Allergies:  Allergies  Allergen Reactions  . Geodon [Ziprasidone Hydrochloride] Other (See Comments)    Extremely agitated  . Lithium Nausea Only and Other (See Comments)    Off balance, increased heart rate  . Talwin [Pentazocine] Other (See Comments)    Hallucinations   . Toradol [Ketorolac Tromethamine] Other (See Comments)    Chest pains  . Abilify [Aripiprazole] Other (See Comments)    jerking  . Compazine [Prochlorperazine Edisylate] Nausea And Vomiting  . Diflucan [Fluconazole] Nausea And Vomiting    Home Medications:  (Not in a hospital admission)  OB/GYN Status:  No LMP recorded. Patient is postmenopausal.  General Assessment Data Location of Assessment: [facility] Assessment Services TTS Assessment: In system Is this a Tele or Face-to-Face Assessment?: Face-to-Face Is this an Initial Assessment or a Re-assessment for this encounter?: Initial Assessment Marital status: Divorced Dante name: [name] Is patient pregnant?: No Pregnancy Status: No Living Arrangements: Alone Can pt return to current living arrangement?: Yes Admission Status: Voluntary Is patient capable of signing  voluntary admission?: Yes Referral Source: Self/Family/Friend Insurance type: [place] Screening Exam ([place]) Medical Exam completed: Yes  Crisis Care Plan Living Arrangements: Alone Name of Psychiatrist: [place] Name of Therapist: Clint Frank  Education Status Is patient currently in school?: No Highest grade of school patient has completed: Some college  Risk to self with the past 6 months Suicidal Ideation: Yes-Currently Present Has patient been a risk to self within the past 6 months prior to admission? : No Suicidal Intent: No Has patient had any suicidal intent within the past 6 months prior to admission? : No Is patient at risk for suicide?: No Suicidal Plan?: No Has patient had any suicidal plan within the past 6 months prior to admission? : No Access to Means: No What has been  your use of drugs/alcohol within the last 12 months?: Etoh Previous Attempts/Gestures: No Other Self Harm Risks: None Intentional Self Injurious Behavior: None Family Suicide History: No Recent stressful life event(s): Trauma (Comment) Persecutory voices/beliefs?: No Depression: Yes Depression Symptoms: Tearfulness, Isolating, Loss of interest in usual pleasures, Feeling worthless/self pity, Feeling angry/irritable Substance abuse history and/or treatment for substance abuse?: No Suicide prevention information given to non-admitted patients: Not applicable  Risk to Others within the past 6 months Homicidal Ideation: No Does patient have any lifetime risk of violence toward others beyond the six months prior to admission? : No Thoughts of Harm to Others: No Current Homicidal Intent: No Current Homicidal Plan: No Access to Homicidal Means: No History of harm to others?: No Assessment of Violence: None Noted Does patient have access to weapons?: No Criminal Charges Pending?: No Does patient have a court date: No Is patient on probation?: No  Psychosis Hallucinations:  None noted Delusions: None noted  Mental Status Report Appearance/Hygiene: Poor hygiene Eye Contact: Good Motor Activity: Freedom of movement Speech: Rapid, Logical/coherent Level of Consciousness: Alert Mood: Depressed, Anxious Affect: Anxious, Depressed Anxiety Level: Minimal Thought Processes: Coherent, Relevant Judgement: Impaired Orientation: Person, Place, Time, Situation, Appropriate for developmental age Obsessive Compulsive Thoughts/Behaviors: None  Cognitive Functioning Concentration: Normal Memory: Recent Intact IQ: Average Impulse Control: Fair Appetite: Good Total Hours of Sleep: 12 Vegetative Symptoms: None  ADLScreening [facility] Assessment Services) Patient's cognitive ability adequate to safely complete daily activities?: Yes Patient able to express need for assistance with ADLs?: Yes Independently performs ADLs?: Yes (appropriate for developmental age)  Prior Inpatient Therapy Prior Inpatient Therapy: Yes Prior Therapy Dates: 2011,2015 Prior Therapy Facilty/Provider(s): [facility] Reason for Treatment: SI/Depression  Prior Outpatient Therapy Prior Outpatient Therapy: Yes Prior Therapy Dates: 2018/19 Ongoing Prior Therapy Facilty/Provider(s): [facility] Reason for Treatment: SI/Depression Does patient have an ACCT team?: No Does patient have Intensive In-House Services?  : No Does patient have [facility] services? : No Does patient have P4CC services?: No  ADL Screening (condition at time of admission) Patient's cognitive ability adequate to safely complete daily activities?: Yes Is the patient deaf or have difficulty hearing?: No Does the patient have difficulty seeing, even when wearing glasses/contacts?: No Does the patient have difficulty concentrating, remembering, or making decisions?: No Patient able to express need for assistance with ADLs?: Yes Does the patient have difficulty dressing or bathing?: No Independently performs ADLs?: Yes (appropriate  for developmental age) Does the patient have difficulty walking or climbing stairs?: No Weakness of Legs: None Weakness of Arms/Hands: None       Abuse/Neglect Assessment (Assessment to be complete while patient is alone) Abuse/Neglect Assessment Can Be Completed: Yes Physical Abuse: Denies Verbal Abuse: Denies Sexual Abuse: Denies Exploitation of patient/patient's resources: Denies Self-Neglect: Denies   Consults Spiritual Care Consult Needed: No Social Work Consult Needed: No Regulatory affairs officer (For Healthcare) Does Patient Have a Medical Advance Directive?: No Would patient like information on creating a medical advance directive?: No - Patient declined    Additional Information 1:1 In Past 12 Months?: No CIRT Risk: No Elopement Risk: No Does patient have medical clearance?: Yes     Disposition:  Disposition Initial Assessment Completed for this Encounter: Yes Disposition of Patient: Inpatient treatment program Type of inpatient treatment program: Adult   Per Patriciaann Clan, PA pt meets inpatient criteria.   On Site Evaluation by:   Reviewed with Physician:    Steffanie Rainwater, MA, LPCA 03/30/2017 10:07 PM

## 2017-03-30 NOTE — Telephone Encounter (Signed)
Spoke with patient. She had an appt for a follow up on CPAP today with TP. Patient cancelled appt. Due to her feeling well and not having any problems with the machine or with her breathing. She stated that she has been using it every night and even bought a SoClean machine to help keep it clean.   Will route this to TP so she is aware.

## 2017-03-31 ENCOUNTER — Emergency Department (HOSPITAL_COMMUNITY): Payer: Medicare Other

## 2017-03-31 ENCOUNTER — Ambulatory Visit: Payer: Self-pay | Admitting: Oncology

## 2017-03-31 ENCOUNTER — Telehealth (HOSPITAL_COMMUNITY): Payer: Self-pay | Admitting: Psychiatry

## 2017-03-31 ENCOUNTER — Telehealth: Payer: Self-pay | Admitting: Oncology

## 2017-03-31 DIAGNOSIS — F332 Major depressive disorder, recurrent severe without psychotic features: Secondary | ICD-10-CM | POA: Diagnosis not present

## 2017-03-31 DIAGNOSIS — Z811 Family history of alcohol abuse and dependence: Secondary | ICD-10-CM | POA: Diagnosis not present

## 2017-03-31 DIAGNOSIS — Z560 Unemployment, unspecified: Secondary | ICD-10-CM | POA: Diagnosis not present

## 2017-03-31 DIAGNOSIS — R45851 Suicidal ideations: Secondary | ICD-10-CM

## 2017-03-31 DIAGNOSIS — F1010 Alcohol abuse, uncomplicated: Secondary | ICD-10-CM | POA: Diagnosis not present

## 2017-03-31 DIAGNOSIS — Z818 Family history of other mental and behavioral disorders: Secondary | ICD-10-CM

## 2017-03-31 LAB — COMPREHENSIVE METABOLIC PANEL
ALT: 21 U/L (ref 14–54)
AST: 30 U/L (ref 15–41)
Albumin: 4.1 g/dL (ref 3.5–5.0)
Alkaline Phosphatase: 79 U/L (ref 38–126)
Anion gap: 7 (ref 5–15)
BUN: 30 mg/dL — ABNORMAL HIGH (ref 6–20)
CO2: 26 mmol/L (ref 22–32)
Calcium: 9.7 mg/dL (ref 8.9–10.3)
Chloride: 106 mmol/L (ref 101–111)
Creatinine, Ser: 1.07 mg/dL — ABNORMAL HIGH (ref 0.44–1.00)
GFR calc Af Amer: 59 mL/min — ABNORMAL LOW (ref >60)
GFR calc non Af Amer: 51 mL/min — ABNORMAL LOW (ref >60)
Glucose, Bld: 99 mg/dL (ref 65–99)
Potassium: 3.8 mmol/L (ref 3.5–5.1)
Sodium: 139 mmol/L (ref 135–145)
Total Bilirubin: 0.6 mg/dL (ref 0.3–1.2)
Total Protein: 7.8 g/dL (ref 6.5–8.1)

## 2017-03-31 LAB — VALPROIC ACID LEVEL: Valproic Acid Lvl: 15 ug/mL — ABNORMAL LOW (ref 50.0–100.0)

## 2017-03-31 LAB — URINALYSIS, ROUTINE W REFLEX MICROSCOPIC
Bilirubin Urine: NEGATIVE
Glucose, UA: NEGATIVE mg/dL
Hgb urine dipstick: NEGATIVE
Ketones, ur: NEGATIVE mg/dL
Leukocytes, UA: NEGATIVE
Nitrite: NEGATIVE
Protein, ur: NEGATIVE mg/dL
Specific Gravity, Urine: 1.013 (ref 1.005–1.030)
pH: 7 (ref 5.0–8.0)

## 2017-03-31 LAB — RAPID URINE DRUG SCREEN, HOSP PERFORMED
Amphetamines: NOT DETECTED
Barbiturates: NOT DETECTED
Benzodiazepines: POSITIVE — AB
Cocaine: NOT DETECTED
Opiates: NOT DETECTED
Tetrahydrocannabinol: NOT DETECTED

## 2017-03-31 LAB — ETHANOL: Alcohol, Ethyl (B): <10 mg/dL (ref <10)

## 2017-03-31 LAB — SALICYLATE LEVEL: Salicylate Lvl: <7 mg/dL (ref 2.8–30.0)

## 2017-03-31 LAB — ACETAMINOPHEN LEVEL: Acetaminophen (Tylenol), Serum: <10 ug/mL — ABNORMAL LOW (ref 10–30)

## 2017-03-31 LAB — CBC
HCT: 40.5 % (ref 36.0–46.0)
Hemoglobin: 13.5 g/dL (ref 12.0–15.0)
MCH: 28.5 pg (ref 26.0–34.0)
MCHC: 33.3 g/dL (ref 30.0–36.0)
MCV: 85.6 fL (ref 78.0–100.0)
Platelets: 245 10*3/uL (ref 150–400)
RBC: 4.73 MIL/uL (ref 3.87–5.11)
RDW: 15.2 % (ref 11.5–15.5)
WBC: 6.6 10*3/uL (ref 4.0–10.5)

## 2017-03-31 MED ORDER — LORAZEPAM 1 MG PO TABS: 1.0000 mg | ORAL_TABLET | Freq: Two times a day (BID) | ORAL | Status: DC | PRN

## 2017-03-31 MED ORDER — LOSARTAN POTASSIUM 50 MG PO TABS
100.0000 mg | ORAL_TABLET | Freq: Every day | ORAL | Status: DC
  Administered 2017-03-31: 100 mg via ORAL
  Filled 2017-03-31: qty 2

## 2017-03-31 MED ORDER — ACETAMINOPHEN 325 MG PO TABS
650.0000 mg | ORAL_TABLET | ORAL | Status: DC | PRN
  Administered 2017-03-31: 650 mg via ORAL
  Filled 2017-03-31: qty 2

## 2017-03-31 MED ORDER — CITALOPRAM HYDROBROMIDE 10 MG PO TABS
10.0000 mg | ORAL_TABLET | Freq: Every day | ORAL | Status: DC
  Administered 2017-03-31: 10 mg via ORAL
  Filled 2017-03-31: qty 1

## 2017-03-31 MED ORDER — ONDANSETRON HCL 4 MG PO TABS: 4.0000 mg | ORAL_TABLET | Freq: Three times a day (TID) | ORAL | Status: DC | PRN

## 2017-03-31 MED ORDER — LOSARTAN POTASSIUM-HCTZ 100-25 MG PO TABS: 1.0000 | ORAL_TABLET | Freq: Every day | ORAL | Status: DC

## 2017-03-31 MED ORDER — ALUM & MAG HYDROXIDE-SIMETH 200-200-20 MG/5ML PO SUSP: 30.0000 mL | Freq: Four times a day (QID) | ORAL | Status: DC | PRN

## 2017-03-31 MED ORDER — HYDROCHLOROTHIAZIDE 25 MG PO TABS
25.0000 mg | ORAL_TABLET | Freq: Every day | ORAL | Status: DC
  Administered 2017-03-31: 25 mg via ORAL
  Filled 2017-03-31: qty 1

## 2017-03-31 MED ORDER — VITAMIN D3 25 MCG (1000 UNIT) PO TABS
2000.0000 [IU] | ORAL_TABLET | Freq: Every day | ORAL | Status: DC
  Administered 2017-03-31: 2000 [IU] via ORAL
  Filled 2017-03-31: qty 2

## 2017-03-31 MED ORDER — MAGNESIUM OXIDE 400 (241.3 MG) MG PO TABS
400.0000 mg | ORAL_TABLET | Freq: Every day | ORAL | Status: DC
  Administered 2017-03-31: 400 mg via ORAL
  Filled 2017-03-31: qty 1

## 2017-03-31 MED ORDER — NEBIVOLOL HCL 2.5 MG PO TABS
2.5000 mg | ORAL_TABLET | Freq: Every day | ORAL | Status: DC
  Administered 2017-03-31: 2.5 mg via ORAL
  Filled 2017-03-31: qty 1

## 2017-03-31 MED ORDER — ASPIRIN EC 81 MG PO TBEC
81.0000 mg | DELAYED_RELEASE_TABLET | Freq: Every day | ORAL | Status: DC
  Administered 2017-03-31: 81 mg via ORAL
  Filled 2017-03-31: qty 1

## 2017-03-31 MED ORDER — ONDANSETRON 4 MG PO TBDP
4.0000 mg | ORAL_TABLET | Freq: Once | ORAL | Status: AC
  Administered 2017-03-31: 4 mg via ORAL
  Filled 2017-03-31: qty 1

## 2017-03-31 MED ORDER — LEVOTHYROXINE SODIUM 88 MCG PO TABS
88.0000 ug | ORAL_TABLET | Freq: Every day | ORAL | Status: DC
  Administered 2017-03-31: 88 ug via ORAL
  Filled 2017-03-31: qty 1

## 2017-03-31 MED ORDER — DIVALPROEX SODIUM ER 500 MG PO TB24
750.0000 mg | ORAL_TABLET | Freq: Every day | ORAL | Status: DC
  Administered 2017-03-31: 750 mg via ORAL
  Filled 2017-03-31: qty 1

## 2017-03-31 MED ORDER — DIMENHYDRINATE 50 MG PO TABS
50.0000 mg | ORAL_TABLET | Freq: Three times a day (TID) | ORAL | Status: DC | PRN
  Filled 2017-03-31: qty 1

## 2017-03-31 MED ORDER — LORAZEPAM 0.5 MG PO TABS
0.5000 mg | ORAL_TABLET | Freq: Two times a day (BID) | ORAL | Status: DC | PRN
  Administered 2017-03-31: 0.5 mg via ORAL
  Filled 2017-03-31: qty 1

## 2017-03-31 MED ORDER — ROPINIROLE HCL 0.5 MG PO TABS
0.5000 mg | ORAL_TABLET | Freq: Two times a day (BID) | ORAL | Status: DC
  Filled 2017-03-31 (×3): qty 1

## 2017-03-31 MED ORDER — DIPHENHYDRAMINE HCL 25 MG PO CAPS: 25.0000 mg | ORAL_CAPSULE | Freq: Two times a day (BID) | ORAL | Status: DC | PRN

## 2017-03-31 NOTE — ED Notes (Signed)
Report called to [facility], nurse Gala Romney took report.  [facility] contacted for transport.

## 2017-03-31 NOTE — ED Provider Notes (Signed)
[facility] DEPT Provider Note   CSN: [id number] Arrival date & time: 03/30/17  2255     History   Chief Complaint Chief Complaint  Patient presents with  . Medical Clearance  . Depression    HPI [name] is a 72 y.o. female.  Pt presents to the ED today with depression and SI.  Pt initially presented to [facility] tonight with these sx.  She has been on 4 different antidepressants without help in her sx.  She was sent here for medical clearance.  Pt feels a little nauseous, but otherwise physically ok.      Past Medical History:  Diagnosis Date  . Abscess of Bartholin's gland   . Acute vestibular neuronitis    Dr Lucia Gaskins  . ADHD (attention deficit hyperactivity disorder)   . Adverse effect of general anesthetic    felt paralyzed while receiving anesthesia  . Anxiety   . Bipolar 1 disorder ([place])   . CAD (coronary artery disease), native coronary artery    cath 05/2016 showing 50-70% stenosis in the mid LAD proximal to the first diagonal and 70-80% small OM1. FFR of LAD  not performed because of difficulty with catheter control from the right radial.   . Chronic kidney disease    kidney cancer- pt states she has elected to not have it treated.  . Depression    Bipolar disorder/goes to [facility] for meds  . Difficult intubation    told by [facility] that she was hard to intubate 15b yrs ago in [place]- surgery since then no problems  . GERD (gastroesophageal reflux disease)   . H/O echocardiogram 07/2012   Normla LVF w grade I siastolic dysfunction   . History of attention deficit disorder   . History of endometriosis   . History of pleural effusion   . Hyperlipidemia LDL goal <70 06/24/2016  . Hypertension   . 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 Memory difficulty 01/26/2017  . Migraines   . Obesity   . OSA (obstructive sleep apnea)    uses oral appliance instead of CPAP  . PAT (paroxysmal atrial  tachycardia) (HCC)    s/p ablation  . PONV (postoperative nausea and vomiting)   . PTSD (post-traumatic stress disorder)   . PVC's (premature ventricular contractions)   . Renal mass 02/15/2012  . RLS (restless legs syndrome)    Dr Gwenette Greet  . rt renal ca dx'd 12/2009   no treatment/ no surg  . Vertigo     Patient Active Problem List   Diagnosis Date Noted  . RLS (restless legs syndrome)   . PONV (postoperative nausea and vomiting)   . Migraines   . History of pleural effusion   . History of attention deficit disorder   . History of endometriosis   . GERD (gastroesophageal reflux disease)   . Depression   . Chronic kidney disease   . CAD (coronary artery disease)   . Bipolar 1 disorder ([place])   . Anxiety   . Adverse effect of general anesthetic   . Acute vestibular neuronitis   . Abscess of Bartholin's gland   . ADHD (attention deficit hyperactivity disorder)   . Memory difficulty 01/26/2017  . Vertigo 01/26/2017  . Hyperlipidemia LDL goal <70 06/24/2016  . GAD (generalized anxiety disorder) 06/03/2015  . PTSD (post-traumatic stress disorder) 01/01/2014  . Bipolar I disorder, most recent episode depressed ([place]) 01/01/2014  . Major depressive disorder, recurrent severe without psychotic  features ([place]) 09/25/2013  . PVC's (premature ventricular contractions) 05/31/2013  . Diastolic dysfunction, left ventricle 05/31/2013  . PAT (paroxysmal atrial tachycardia) ([place]) 05/31/2013  . Essential hypertension, benign 05/31/2013  . Obesity 05/31/2013  . Recurrent major depression-severe ([place]) 04/27/2013  . H/O echocardiogram 07/20/2012  . Generalized anxiety disorder 06/30/2012  . Renal mass 02/15/2012  . GALLSTONES 12/31/2009  . ATTENTION DEFICIT DISORDER 02/20/2007  . Obstructive sleep apnea 02/20/2007  . RESTLESS LEGS SYNDROME 02/20/2007  . Hypothyroidism 03/22/1988    Past Surgical History:  Procedure Laterality Date  . CARDIAC ELECTROPHYSIOLOGY [place] AND ABLATION   2000s  . LEFT HEART CATH AND CORONARY ANGIOGRAPHY N/A 06/08/2016   Procedure: Left Heart Cath and Coronary Angiography;  Surgeon: Belva Crome, MD;  Location: [place] CV [facility];  Service: Cardiovascular;  Laterality: N/A;  . nasoseptal reconstruction  1990s  . THYROIDECTOMY  1990  . TONSILLECTOMY     as a child  . TUBAL LIGATION  1970s  . uterine mass removal  03/2012   was found to be benign    OB History    No data available       Home Medications    Prior to Admission medications   Medication Sig Start Date End Date Taking? Authorizing Provider  aspirin 81 MG EC tablet Take 1 tablet (81 mg total) by mouth daily. For heart health 05/08/13  Yes Lindell Spar I, NP  cholecalciferol (VITAMIN D) 1000 units tablet Take 2,000 Units by mouth daily.   Yes [provider]  dimenhyDRINATE (DRAMAMINE) 50 MG tablet Take 50 mg by mouth every 8 (eight) hours as needed for nausea or dizziness.   Yes [provider]  diphenhydrAMINE (BENADRYL) 25 MG tablet Take 25 mg by mouth 2 (two) times daily as needed for allergies.   Yes [provider]  divalproex (DEPAKOTE ER) 250 MG 24 hr tablet Take 3 tablets (750 mg total) by mouth daily. 03/03/17  Yes [name], MD  ibuprofen (ADVIL,MOTRIN) 200 MG tablet Take 600-800 mg by mouth every 6 (six) hours as needed (pain).    Yes [provider]  levothyroxine (SYNTHROID, LEVOTHROID) 88 MCG tablet Take 1 tablet (88 mcg total) by mouth daily before breakfast. For low thyroid function 05/08/13  Yes Nwoko, Agnes I, NP  LORazepam (ATIVAN) 1 MG tablet Take 1 tablet (1 mg total) by mouth 2 (two) times daily as needed for anxiety. 03/03/17 03/03/18 Yes [name], MD  losartan-hydrochlorothiazide (HYZAAR) 100-25 MG tablet Take 1 tablet by mouth daily. 10/25/16  Yes [provider]  Magnesium 500 MG TABS Take 500 mg by mouth daily.   Yes [provider]  nebivolol (BYSTOLIC) 2.5 MG tablet Take 1 tablet (2.5 mg  total) by mouth daily. 02/23/17  Yes Turner, Eber Hong, MD  rOPINIRole (REQUIP) 0.5 MG tablet TAKE 1 TABLET BY MOUTH IN THE AFTERNOON AND 1 TABLET AT BEDTIME Patient taking differently: TAKE 1 TABLET BY MOUTH IN THE AFTERNOON AND 1 TABLET AT BEDTIME AS NEEDED for restless legs 02/07/17  Yes Rigoberto Noel, MD  Brexpiprazole (REXULTI) 0.5 MG TABS Take 1 tablet (0.5 mg total) by mouth daily. Patient not taking: Reported on 03/30/2017 03/03/17   [name], MD    Family History Family History  Problem Relation Age of Onset  . Allergies Father   . Skin cancer Father   . Bipolar disorder Father   . Alcohol abuse Father   . Heart disease Mother   . Depression Mother   .  Hypertension Mother   . CAD Mother   . Colon cancer Paternal Grandmother   . OCD Paternal Grandmother   . Allergies Sister        multiple  . Allergies Brother        multiple  . Allergies Daughter   . Heart disease Maternal Grandfather   . Cancer Paternal Grandfather     Social History Social History   Tobacco Use  . Smoking status: Never Smoker  . Smokeless tobacco: Never Used  Substance Use Topics  . Alcohol use: Yes    Alcohol/week: 0.6 oz    Types: 1 Glasses of wine per week  . Drug use: No     Allergies   Geodon [ziprasidone hydrochloride]; Lithium; Talwin [pentazocine]; Toradol [ketorolac tromethamine]; Abilify [aripiprazole]; Compazine [prochlorperazine edisylate]; Pristiq [desvenlafaxine succinate er]; Rexulti [brexpiprazole]; and Diflucan [fluconazole]   Review of Systems Review of Systems  Psychiatric/Behavioral: Positive for dysphoric mood and suicidal ideas.  All other systems reviewed and are negative.    Physical Exam Updated Vital Signs BP (!) 144/79 (BP Location: Left Arm)   Pulse 61   Temp 98.2 F (36.8 C) (Oral)   Resp 16   SpO2 100%   Physical Exam  Constitutional: She is oriented to person, place, and time. She appears well-developed and well-nourished.  HENT:  Head:  Normocephalic and atraumatic.  Right Ear: External ear normal.  Left Ear: External ear normal.  Nose: Nose normal.  Mouth/Throat: Oropharynx is clear and moist.  Eyes: Conjunctivae and EOM are normal. Pupils are equal, round, and reactive to light.  Neck: Normal range of motion. Neck supple.  Cardiovascular: Normal rate, regular rhythm, normal heart sounds and intact distal pulses.  Pulmonary/Chest: Effort normal and breath sounds normal.  Abdominal: Soft. Bowel sounds are normal.  Musculoskeletal: Normal range of motion.  Neurological: She is alert and oriented to person, place, and time.  Skin: Skin is warm. Capillary refill takes less than 2 seconds.  Psychiatric: Her mood appears anxious. She exhibits a depressed mood.  Nursing note and vitals reviewed.    ED Treatments / Results  Labs (all labs ordered are listed, but only abnormal results are displayed) Labs Reviewed  COMPREHENSIVE METABOLIC PANEL - Abnormal; Notable for the following components:      Result Value   BUN 30 (*)    Creatinine, Ser 1.07 (*)    GFR calc non Af Amer 51 (*)    GFR calc Af Amer 59 (*)    All other components within normal limits  ACETAMINOPHEN LEVEL - Abnormal; Notable for the following components:   Acetaminophen (Tylenol), Serum <10 (*)    All other components within normal limits  RAPID URINE DRUG SCREEN, HOSP PERFORMED - Abnormal; Notable for the following components:   Benzodiazepines POSITIVE (*)    All other components within normal limits  VALPROIC ACID LEVEL - Abnormal; Notable for the following components:   Valproic Acid Lvl 15 (*)    All other components within normal limits  URINALYSIS, ROUTINE W REFLEX MICROSCOPIC - Abnormal; Notable for the following components:   Color, Urine STRAW (*)    All other components within normal limits  ETHANOL  SALICYLATE LEVEL  CBC    EKG  EKG Interpretation  Date/Time:  Thursday March 31 2017 01:51:28 EST Ventricular Rate:  67 PR  Interval:    QRS Duration: 101 QT Interval:  414 QTC Calculation: 437 R Axis:   73 Text Interpretation:  Sinus rhythm Low voltage, precordial leads  Confirmed by [name] [phone]) on 03/31/2017 2:24:36 AM Also confirmed by [name] [phone]), editor Hattie Perch ([id number])  on 03/31/2017 7:25:07 AM       Radiology No results found.  Procedures Procedures (including critical care time)  Medications Ordered in ED Medications  ondansetron (ZOFRAN-ODT) disintegrating tablet 4 mg (4 mg Oral Given 03/31/17 0221)     Initial Impression / Assessment and Plan / ED Course  I have reviewed the triage vital signs and the nursing notes.  Pertinent labs & imaging results that were available during my care of the patient were reviewed by me and considered in my medical decision making (see chart for details).    Pt is medically clear.  TTS consult pending.  Final Clinical Impressions(s) / ED Diagnoses   Final diagnoses:  Severe episode of recurrent major depressive disorder, without psychotic features [facility])    ED Discharge Orders    None       [name], MD 04/04/17 1211

## 2017-03-31 NOTE — Telephone Encounter (Signed)
3:00pm 03/31/17 Patient called requesting to see Dr. Doyne Keel because she was admitted over at [facility].   Recommendation is to discharge her to another facility - the patient stated that she didn't want that because it's out of [facility]- she want to see Dr. Doyne Keel today - informed that provider schedule is full and that I would have to speak with Dr. Doyne Keel.  I  Sunday Spillers)  presented Dr. Doyne Keel and Raquel Sarna [facility] [place] Nurse Manager) of the situation and  Shawn spoke with the patient and gave her Dr. Doyne Keel recommendation.  Sunday Spillers

## 2017-03-31 NOTE — ED Notes (Signed)
Pt requesting a laxative. States she hasn't had a BM in a few days and normally goes QD.

## 2017-03-31 NOTE — BH Assessment (Signed)
[facility] Assessment Progress Note  Per Buford Dresser, DO, this pt requires psychiatric hospitalization at this time.  At 14:26 Gerald Stabs calls from [facility].  Pt has been accepted to their facility by Annie Sable, NP to the service of Dr Launa Grill, to the [place] [facility], Rm 151-1.  Waylan Boga, DNP, concurs with this disposition, as does the pt who is currently under voluntary status; pt assures this writer that she will sign consent for admission at the accepting facility.  Pt's nurse, Caren Griffins, has been notified, and agrees to call report to [phone].  Pt is to be transported via [facility] stipulates that pt must arrive either before 23:00 tonight, or after 06:00 tomorrow.  Pt has signed Consent to Release Information to Dr Doyne Keel, pt's outpatient provider, and this writer has placed a notification call.  Jalene Mullet, [place] Coordinator [phone]

## 2017-03-31 NOTE — Telephone Encounter (Signed)
Patient called to cancel she is in the hospital  °

## 2017-03-31 NOTE — ED Notes (Signed)
TTS contacted related to pt status [name] has been accepted for inpatient but no beds available at this time  Pt waiting placement.

## 2017-03-31 NOTE — Consult Note (Signed)
[facility] Consult   Reason for Consult:  Depression with suicidal ideations Referring Physician:  EDP Patient Identification: [name] MRN:  [id number] Principal Diagnosis: Severe episode of recurrent major depressive disorder [facility]) Diagnosis:   Patient Active Problem List   Diagnosis Date Noted  . Severe episode of recurrent major depressive disorder ([place]) [F33.2] 04/28/2013    Priority: High  . Generalized anxiety disorder [F41.1] 06/30/2012    Priority: High  . RLS (restless legs syndrome) [G25.81]   . PONV (postoperative nausea and vomiting) [R11.2, Z98.890]   . OSA (obstructive sleep apnea) [G47.33]   . Migraines [G43.909]   . Hypertension [I10]   . History of pleural effusion [Z87.09]   . History of attention deficit disorder [Z86.59]   . History of endometriosis [Z87.42]   . GERD (gastroesophageal reflux disease) [K21.9]   . Dysrhythmia [I49.9]   . Depression [F32.9]   . Chronic kidney disease [N18.9]   . CAD (coronary artery disease) [I25.10]   . Bipolar 1 disorder ([name]) [F31.9]   . Anxiety [F41.9]   . Adverse effect of general anesthetic [T41.205A]   . Acute vestibular neuronitis [H81.20]   . Abscess of Bartholin's gland [N75.1]   . ADHD (attention deficit hyperactivity disorder) [F90.9]   . Memory difficulty [R41.3] 01/26/2017  . Vertigo [R42] 01/26/2017  . Hyperlipidemia LDL goal <70 [E78.5] 06/24/2016  . CAD (coronary artery disease), native coronary artery [I25.10]   . GAD (generalized anxiety disorder) [F41.1] 06/03/2015  . PTSD (post-traumatic stress disorder) [F43.10] 01/01/2014  . Bipolar I disorder, most recent episode depressed ([name]) [F31.30] 01/01/2014  . MDD (major depressive disorder), recurrent severe, without psychosis ([name]) [F33.2] 09/25/2013  . PVC's (premature ventricular contractions) [I49.3] 05/31/2013  . Diastolic dysfunction, left ventricle [I51.9] 05/31/2013  . PAT (paroxysmal atrial tachycardia) ([place]) [I47.1]  05/31/2013  . Essential hypertension, benign [I10] 05/31/2013  . Obesity [E66.9] 05/31/2013  . Recurrent major depression-severe ([name]) [F33.2] 04/27/2013  . H/O echocardiogram [Z92.89] 07/20/2012  . Renal mass [N28.89] 02/15/2012  . GALLSTONES [K80.20] 12/31/2009  . ATTENTION DEFICIT DISORDER [F98.8] 02/20/2007  . Obstructive sleep apnea [G47.33] 02/20/2007  . RESTLESS LEGS SYNDROME [G25.81] 02/20/2007  . Hypothyroidism [E03.9] 03/22/1988    Total Time spent with patient: 45 minutes  HPI:   Patient is a 72 y.o. female patient admitted to the ER following increasing anxiety and feelings of sadness over the last 2 months. Patient previously taking Rexulti for this issue but started having "significant side effects" to include nausea and increasing fatigue and her psychiatric provider Dr. Loma Sender stopped this medication and patient notes that no other medication was restarted in its place for an unknown reason. Patient also states she was recently diagnosed with a slowly progressive form of kidney cancer that is followed by Dr. Keene Breath that patient declines to treat. Patient states increased feelings of isolation, worthlessness, fatigue, and inability to accomplish things that she is normally able to do at home. Patient admits to feeling suicidal and no longer wants to be in this world. Patient denies HI. Patient is hoping for help to get her depression under control so that she may better function and not feel this way at home.  Past Psychiatric History:  Major Depressive Disorder General Anxiety Disorder Attention Deficit Disorder  Risk to Self: Is patient at risk for suicide?: No, but patient needs Medical Clearance Risk to Others:  None. Denies HI.  Prior Inpatient Therapy:  Yes Prior Outpatient Therapy:  She has an outpatient mental health provider.  Past Medical History:  Past Medical History:  Diagnosis Date  . Abscess of Bartholin's gland   . Acute vestibular neuronitis    Dr  Lucia Gaskins  . ADHD (attention deficit hyperactivity disorder)   . Adverse effect of general anesthetic    felt paralyzed while receiving anesthesia  . Anxiety   . Bipolar 1 disorder ([name])   . CAD (coronary artery disease)   . CAD (coronary artery disease), native coronary artery    cath 05/2016 showing 50-70% stenosis in the mid LAD proximal to the first diagonal and 70-80% small OM1. FFR of LAD  not performed because of difficulty with catheter control from the right radial.   . Chronic kidney disease    kidney cancer- pt states she has elected to not have it treated.  . Depression    Bipolar disorder/goes to [facility] for meds  . Difficult intubation    told by [facility] that she was hard to intubate 15b yrs ago in [place]- surgery since then no problems  . Dysrhythmia    h/o atrial tachycardia- s/p ablation   . GERD (gastroesophageal reflux disease)   . H/O echocardiogram 07/2012   [place] LVF w grade I siastolic dysfunction   . History of attention deficit disorder   . History of endometriosis   . History of pleural effusion   . Hyperlipidemia LDL goal <70 06/24/2016  . Hypertension   . 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 Memory difficulty 01/26/2017  . Migraines   . Obesity   . OSA (obstructive sleep apnea)    uses oral appliance instead of CPAP  . PONV (postoperative nausea and vomiting)   . PTSD (post-traumatic stress disorder)   . PVC's (premature ventricular contractions)   . Renal mass 02/15/2012  . RLS (restless legs syndrome)    Dr [name]  . rt renal ca dx'd 12/2009   no treatment/ no surg  . Vertigo     Past Surgical History:  Procedure Laterality Date  . CARDIAC ELECTROPHYSIOLOGY [place] AND ABLATION  2000s  . LEFT HEART CATH AND CORONARY ANGIOGRAPHY N/A 06/08/2016   Procedure: Left Heart Cath and Coronary Angiography;  Surgeon: Belva Crome, MD;  Location: [facility];  Service: Cardiovascular;  Laterality: N/A;  . nasoseptal  reconstruction  1990s  . THYROIDECTOMY  1990  . TONSILLECTOMY     as a child  . TUBAL LIGATION  1970s  . uterine mass removal  03/2012   was found to be benign   Family History:  Family History  Problem Relation Age of Onset  . Allergies Father   . Skin cancer Father   . Bipolar disorder Father   . Alcohol abuse Father   . Heart disease Mother   . Depression Mother   . Hypertension Mother   . CAD Mother   . Colon cancer Paternal Grandmother   . OCD Paternal Grandmother   . Allergies Sister        multiple  . Allergies Brother        multiple  . Allergies Daughter   . Heart disease Maternal Grandfather   . Cancer Paternal Grandfather     Social History:  Social History   Substance and Sexual Activity  Alcohol Use Yes  . Alcohol/week: 0.6 oz  . Types: 1 Glasses of wine per week     Social History   Substance and Sexual Activity  Drug Use No    Social History  Socioeconomic History  . Marital status: Single    Spouse name: None  . Number of children: 1  . Years of education: 37  . Highest education level: None  Social Needs  . Financial resource strain: None  . Food insecurity - worry: None  . Food insecurity - inability: None  . Transportation needs - medical: None  . Transportation needs - non-medical: None  Occupational History  . 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 Smoking status: Never Smoker  . Smokeless tobacco: Never Used  Substance and Sexual Activity  . Alcohol use: Yes    Alcohol/week: 0.6 oz    Types: 1 Glasses of wine per week  . Drug use: No  . Sexual activity: Yes    Partners: Male    Birth control/protection: None  Other Topics Concern  . None  Social History Narrative   Divorced and lives alone   Has children   Caffeine use: none   Right handed     Allergies:   Allergies  Allergen Reactions  . Geodon [Ziprasidone Hydrochloride] Other (See Comments)    Extremely agitated  . Lithium Nausea  Only and Other (See Comments)    Off balance, increased heart rate  . Talwin [Pentazocine] Other (See Comments)    Hallucinations   . Toradol [Ketorolac Tromethamine] Other (See Comments)    Chest pains  . Abilify [Aripiprazole] Other (See Comments)    jerking  . Compazine [Prochlorperazine Edisylate] Nausea And Vomiting  . Pristiq [Desvenlafaxine Succinate Er] Other (See Comments)    Did not work, prefers not to take  . Rexulti [Brexpiprazole] Swelling    Elevated BP, created aggression  . Diflucan [Fluconazole] Nausea And Vomiting    Labs:  Results for orders placed or performed during the hospital encounter of 03/30/17 (from the past 48 hour(s))  Comprehensive metabolic panel     Status: Abnormal   Collection Time: 03/31/17 12:44 AM  Result Value Ref Range   Sodium 139 135 - 145 mmol/L   Potassium 3.8 3.5 - 5.1 mmol/L   Chloride 106 101 - 111 mmol/L   CO2 26 22 - 32 mmol/L   Glucose, Bld 99 65 - 99 mg/dL   BUN 30 (H) 6 - 20 mg/dL   Creatinine, Ser 1.07 (H) 0.44 - 1.00 mg/dL   Calcium 9.7 8.9 - 10.3 mg/dL   Total Protein 7.8 6.5 - 8.1 g/dL   Albumin 4.1 3.5 - 5.0 g/dL   AST 30 15 - 41 U/L   ALT 21 14 - 54 U/L   Alkaline Phosphatase 79 38 - 126 U/L   Total Bilirubin 0.6 0.3 - 1.2 mg/dL   GFR calc non Af Amer 51 (L) >60 mL/min   GFR calc Af Amer 59 (L) >60 mL/min    Comment: (NOTE) The eGFR has been calculated using the CKD EPI equation. This calculation has not been validated in all clinical situations. eGFR's persistently <60 mL/min signify possible Chronic Kidney Disease.    Anion gap 7 5 - 15  Ethanol     Status: None   Collection Time: 03/31/17 12:44 AM  Result Value Ref Range   Alcohol, Ethyl (B) <10 <10 mg/dL    Comment:        LOWEST DETECTABLE LIMIT FOR SERUM ALCOHOL IS 10 mg/dL FOR MEDICAL PURPOSES ONLY   Salicylate level     Status: None   Collection Time: 03/31/17 12:44 AM  Result Value Ref Range   Salicylate Lvl <  7.0 2.8 - 30.0 mg/dL   Acetaminophen level     Status: Abnormal   Collection Time: 03/31/17 12:44 AM  Result Value Ref Range   Acetaminophen (Tylenol), Serum <10 (L) 10 - 30 ug/mL    Comment:        THERAPEUTIC CONCENTRATIONS VARY SIGNIFICANTLY. A RANGE OF 10-30 ug/mL MAY BE AN EFFECTIVE CONCENTRATION FOR MANY PATIENTS. HOWEVER, SOME ARE BEST TREATED AT CONCENTRATIONS OUTSIDE THIS RANGE. ACETAMINOPHEN CONCENTRATIONS >150 ug/mL AT 4 HOURS AFTER INGESTION AND >50 ug/mL AT 12 HOURS AFTER INGESTION ARE OFTEN ASSOCIATED WITH TOXIC REACTIONS.   cbc     Status: None   Collection Time: 03/31/17 12:44 AM  Result Value Ref Range   WBC 6.6 4.0 - 10.5 K/uL   RBC 4.73 3.87 - 5.11 MIL/uL   Hemoglobin 13.5 12.0 - 15.0 g/dL   HCT 40.5 36.0 - 46.0 %   MCV 85.6 78.0 - 100.0 fL   MCH 28.5 26.0 - 34.0 pg   MCHC 33.3 30.0 - 36.0 g/dL   RDW 15.2 11.5 - 15.5 %   Platelets 245 150 - 400 K/uL  Rapid urine drug screen (hospital performed)     Status: Abnormal   Collection Time: 03/31/17 12:44 AM  Result Value Ref Range   Opiates NONE DETECTED NONE DETECTED   Cocaine NONE DETECTED NONE DETECTED   Benzodiazepines POSITIVE (A) NONE DETECTED   Amphetamines NONE DETECTED NONE DETECTED   Tetrahydrocannabinol NONE DETECTED NONE DETECTED   Barbiturates NONE DETECTED NONE DETECTED    Comment: (NOTE) DRUG SCREEN FOR MEDICAL PURPOSES ONLY.  IF CONFIRMATION IS NEEDED FOR ANY PURPOSE, NOTIFY LAB WITHIN 5 DAYS. LOWEST DETECTABLE LIMITS FOR URINE DRUG SCREEN Drug Class                     Cutoff (ng/mL) Amphetamine and metabolites    1000 Barbiturate and metabolites    200 Benzodiazepine                 100 Tricyclics and metabolites     300 Opiates and metabolites        300 Cocaine and metabolites        300 THC                            50   Valproic acid level     Status: Abnormal   Collection Time: 03/31/17  2:21 AM  Result Value Ref Range   Valproic Acid Lvl 15 (L) 50.0 - 100.0 ug/mL    Current  Facility-Administered Medications  Medication Dose Route Frequency Provider Last Rate Last Dose  . acetaminophen (TYLENOL) tablet 650 mg  650 mg Oral Q4H PRN [name], MD      . alum & mag hydroxide-simeth (MAALOX/MYLANTA) 200-200-20 MG/5ML suspension 30 mL  30 mL Oral Q6H PRN [name], MD      . aspirin EC tablet 81 mg  81 mg Oral Daily [name], MD   81 mg at 03/31/17 0925  . cholecalciferol (VITAMIN D) tablet 2,000 Units  2,000 Units Oral Daily [name], MD   2,000 Units at 03/31/17 [id number]  . dimenhyDRINATE (DRAMAMINE) tablet 50 mg  50 mg Oral Q8H PRN [name], MD      . diphenhydrAMINE (BENADRYL) capsule 25 mg  25 mg Oral BID PRN [name], MD      . divalproex (DEPAKOTE ER) 24 hr tablet 750 mg  750 mg  Oral Daily [name], MD   750 mg at 03/31/17 5284  . hydrochlorothiazide (HYDRODIURIL) tablet 25 mg  25 mg Oral Daily [name], MD   25 mg at 03/31/17 1324  . levothyroxine (SYNTHROID, LEVOTHROID) tablet 88 mcg  88 mcg Oral QAC breakfast [name], MD   88 mcg at 03/31/17 4010  . losartan (COZAAR) tablet 100 mg  100 mg Oral Daily [name], MD   100 mg at 03/31/17 2725  . magnesium oxide (MAG-OX) tablet 400 mg  400 mg Oral Daily [name], MD   400 mg at 03/31/17 0924  . nebivolol (BYSTOLIC) tablet 2.5 mg  2.5 mg Oral Daily [name], MD   2.5 mg at 03/31/17 3664  . ondansetron (ZOFRAN) tablet 4 mg  4 mg Oral Q8H PRN [name], MD      . rOPINIRole (REQUIP) tablet 0.5 mg  0.5 mg Oral BID [name], MD       Current Outpatient Medications  Medication Sig Dispense Refill  . aspirin 81 MG EC tablet Take 1 tablet (81 mg total) by mouth daily. For heart health 30 tablet 1  . cholecalciferol (VITAMIN D) 1000 units tablet Take 2,000 Units by mouth daily.    Marland Kitchen dimenhyDRINATE (DRAMAMINE) 50 MG tablet Take 50 mg by mouth every 8 (eight) hours as needed for nausea or dizziness.    . diphenhydrAMINE (BENADRYL) 25 MG  tablet Take 25 mg by mouth 2 (two) times daily as needed for allergies.    Marland Kitchen divalproex (DEPAKOTE ER) 250 MG 24 hr tablet Take 3 tablets (750 mg total) by mouth daily. 90 tablet 0  . ibuprofen (ADVIL,MOTRIN) 200 MG tablet Take 600-800 mg by mouth every 6 (six) hours as needed (pain).     Marland Kitchen levothyroxine (SYNTHROID, LEVOTHROID) 88 MCG tablet Take 1 tablet (88 mcg total) by mouth daily before breakfast. For low thyroid function    . LORazepam (ATIVAN) 1 MG tablet Take 1 tablet (1 mg total) by mouth 2 (two) times daily as needed for anxiety. 60 tablet 0  . losartan-hydrochlorothiazide (HYZAAR) 100-25 MG tablet Take 1 tablet by mouth daily.  2  . Magnesium 500 MG TABS Take 500 mg by mouth daily.    . nebivolol (BYSTOLIC) 2.5 MG tablet Take 1 tablet (2.5 mg total) by mouth daily. 90 tablet 0  . rOPINIRole (REQUIP) 0.5 MG tablet TAKE 1 TABLET BY MOUTH IN THE AFTERNOON AND 1 TABLET AT BEDTIME (Patient taking differently: TAKE 1 TABLET BY MOUTH IN THE AFTERNOON AND 1 TABLET AT BEDTIME AS NEEDED for restless legs) 30 tablet 1  . Brexpiprazole (REXULTI) 0.5 MG TABS Take 1 tablet (0.5 mg total) by mouth daily. (Patient not taking: Reported on 03/30/2017) 30 tablet 0    Musculoskeletal: Strength & Muscle Tone: within normal limits Gait & Station: normal  Psychiatric Specialty Exam: Physical Exam  Nursing note and vitals reviewed. Constitutional: She is oriented to person, place, and time. She appears well-developed and well-nourished.  HENT:  Head: Normocephalic and atraumatic.  Neck: Normal range of motion.  Respiratory: Effort normal.  Musculoskeletal: Normal range of motion.  Neurological: She is alert and oriented to person, place, and time. She has normal strength.  Psychiatric: Her speech is normal. Judgment normal. She is withdrawn. Cognition and memory are normal. She exhibits a depressed mood. She expresses suicidal ideation. She expresses suicidal plans.  Tearful    ROS  Blood pressure  124/65, pulse 63, temperature 99 F (37.2 C), temperature source  Oral, resp. rate 18, SpO2 99 %.There is no height or weight on file to calculate BMI.  General Appearance: Casual  Eye Contact:  Fair  Speech:  Clear and Coherent  Volume:  Normal  Mood:  Hopeless  Affect:  Tearful  Thought Process:  Goal Directed  Orientation:  Full (Time, Place, and Person)  Thought Content:  Negative  Suicidal Thoughts:  Yes.  without intent/plan  Homicidal Thoughts:  No  Memory:  Immediate;   Good Recent;   Good Remote;   Good  Judgement:  Fair  Insight:  Lacking  Psychomotor Activity:  Normal  Concentration:  Concentration: Good and Attention Span: Fair  Recall:  Good  Fund of Knowledge:  Good  Language:  Good  Akathisia:  No  AIMS (if indicated):    N/A  Assets:  Communication Skills Desire for Improvement Housing  ADL's:  Impaired  Cognition:  Impaired,  Mild  Sleep:    N/A     Treatment Plan Summary: Daily contact with patient to assess and evaluate symptoms and progress in treatment, Medication management and Plan major depressive disorder, recurrent, severe without psychosis:  -Crisis stabilization -Medication management:  Medical medications restarted along with Depakote 750 mg daily for mood stabilization, started Celexa 10 mg daily for depression -Individual counseling  Disposition: Recommend psychiatric Inpatient admission when medically cleared.  Waylan Boga, NP 03/31/2017 10:46 AM   Patient seen face-to-face for psychiatric evaluation, chart reviewed and case discussed with the physician extender and developed treatment plan. Reviewed the information documented and agree with the treatment plan.  Buford Dresser, DO

## 2017-04-01 NOTE — Telephone Encounter (Signed)
atc pt, no answer and no vm.  Wcb.  

## 2017-04-01 NOTE — Telephone Encounter (Signed)
Noted, thank you.  Patient's chart shows that an order to start CPAP was sent in October 2018.  To be compliant, she needs an office visit 31-90 days after starting CPAP therapy.  Thank you.

## 2017-04-03 ENCOUNTER — Encounter: Payer: Self-pay | Admitting: Cardiology

## 2017-04-03 NOTE — Progress Notes (Deleted)
Cardiology Office Note:    Date:  04/03/2017   ID:  [name], DOB 1945-11-20, MRN [id number]  PCP:  Leighton Ruff, MD  Cardiologist:  No primary care provider on file.    Referring MD: Leighton Ruff, MD   No chief complaint on file.   History of Present Illness:    [name] is a 72 y.o. female with a hx of with a history of PVC's, diastolic dysfunction, PAT s/p ablation and HTN.  She also has a history of ASCAD with coronary CTA showing a calcium score of 364 and one vessel CAD with moderate disease of the ostial and prox LAD and moderate - severe stenosis of the mid LAD,  She subsequently underwent cath showing 50-70% mid LAD proximal to the D1 and 75-80% mid OM with luminal irregularities in the mid RCA with normal LVF.  Medical therapy was recommended first and if continued to have angina or ischemia in anterior wall then would consider PCI of the OM.  It was felt that the LAD would be higher risk due to significant calcification and angulation.  As this was not an ACS, PCI was not performed.   She is here today for followup and is doing well.  She denies any chest pain or pressure, SOB, DOE, PND, orthopnea, LE edema, dizziness, palpitations or syncope. She is compliant with her meds and is tolerating meds with no SE.       Past Medical History:  Diagnosis Date  . Abscess of Bartholin's gland   . Acute vestibular neuronitis    Dr Lucia Gaskins  . ADHD (attention deficit hyperactivity disorder)   . Adverse effect of general anesthetic    felt paralyzed while receiving anesthesia  . Anxiety   . Bipolar 1 disorder ([place])   . CAD (coronary artery disease)   . CAD (coronary artery disease), native coronary artery    cath 05/2016 showing 50-70% stenosis in the mid LAD proximal to the first diagonal and 70-80% small OM1. FFR of LAD  not performed because of difficulty with catheter control from the right radial.   . Chronic kidney disease    kidney cancer- pt states  she has elected to not have it treated.  . Depression    Bipolar disorder/goes to [facility] for meds  . Difficult intubation    told by [employer] that she was hard to intubate 15b yrs ago in [place]- surgery since then no problems  . Dysrhythmia    h/o atrial tachycardia- s/p ablation   . GERD (gastroesophageal reflux disease)   . H/O echocardiogram 07/2012   Normla LVF w grade I siastolic dysfunction   . History of attention deficit disorder   . History of endometriosis   . History of pleural effusion   . Hyperlipidemia LDL goal <70 06/24/2016  . Hypertension   . 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 Memory difficulty 01/26/2017  . Migraines   . Obesity   . OSA (obstructive sleep apnea)    uses oral appliance instead of CPAP  . PONV (postoperative nausea and vomiting)   . PTSD (post-traumatic stress disorder)   . PVC's (premature ventricular contractions)   . Renal mass 02/15/2012  . RLS (restless legs syndrome)    Dr Gwenette Greet  . rt renal ca dx'd 12/2009   no treatment/ no surg  . Vertigo     Past Surgical History:  Procedure Laterality Date  . CARDIAC ELECTROPHYSIOLOGY MAPPING AND ABLATION  2000s  . LEFT HEART CATH AND CORONARY ANGIOGRAPHY N/A 06/08/2016   Procedure: Left Heart Cath and Coronary Angiography;  Surgeon: Belva Crome, MD;  Location: [facility];  Service: Cardiovascular;  Laterality: N/A;  . nasoseptal reconstruction  1990s  . THYROIDECTOMY  1990  . TONSILLECTOMY     as a child  . TUBAL LIGATION  1970s  . uterine mass removal  03/2012   was found to be benign    Current Medications: No outpatient medications have been marked as taking for the 04/04/17 encounter (Appointment) with Sueanne Margarita, MD.     Allergies:   Geodon [ziprasidone hydrochloride]; Lithium; Talwin [pentazocine]; Toradol [ketorolac tromethamine]; Abilify [aripiprazole]; Compazine [prochlorperazine edisylate]; Pristiq [desvenlafaxine succinate er];  Rexulti [brexpiprazole]; and Diflucan [fluconazole]   Social History   Socioeconomic History  . Marital status: Single    Spouse name: Not on file  . Number of children: 1  . Years of education: 12  . Highest education level: Not on file  Social Needs  . Financial resource strain: Not on file  . Food insecurity - worry: Not on file  . Food insecurity - inability: Not on file  . Transportation needs - medical: Not on file  . Transportation needs - non-medical: Not on file  Occupational History  . 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 Smoking status: Never Smoker  . Smokeless tobacco: Never Used  Substance and Sexual Activity  . Alcohol use: Yes    Alcohol/week: 0.6 oz    Types: 1 Glasses of wine per week  . Drug use: No  . Sexual activity: Yes    Partners: Male    Birth control/protection: None  Other Topics Concern  . Not on file  Social History Narrative   Divorced and lives alone   Has children   Caffeine use: none   Right handed      Family History: The patient's family history includes Alcohol abuse in her father; Allergies in her brother, daughter, father, and sister; Bipolar disorder in her father; CAD in her mother; Cancer in her paternal grandfather; Colon cancer in her paternal grandmother; Depression in her mother; Heart disease in her maternal grandfather and mother; Hypertension in her mother; OCD in her paternal grandmother; Skin cancer in her father.  ROS:   Please see the history of present illness.    ROS  All other systems reviewed and negative.   EKGs/Labs/Other Studies Reviewed:    The following studies were reviewed today: none  EKG:  EKG is not ordered today.   Recent Labs: 03/31/2017: ALT 21; BUN 30; Creatinine, Ser 1.07; Hemoglobin 13.5; Platelets 245; Potassium 3.8; Sodium 139   Recent Lipid Panel    Component Value Date/Time   CHOL 167 04/21/2013 0530   TRIG 106 04/21/2013 0530   HDL 44 04/21/2013 0530     CHOLHDL 3.8 04/21/2013 0530   VLDL 21 04/21/2013 0530   LDLCALC 102 (H) 04/21/2013 0530    Physical Exam:    VS:  There were no vitals taken for this visit.    Wt Readings from Last 3 Encounters:  01/26/17 220 lb (99.8 kg)  12/31/16 221 lb (100.2 kg)  10/29/16 210 lb (95.3 kg)     GEN:  Well nourished, well developed in no acute distress HEENT: Normal NECK: No JVD; No carotid bruits LYMPHATICS: No lymphadenopathy CARDIAC: RRR, no murmurs, rubs, gallops RESPIRATORY:  Clear to auscultation without rales, wheezing or rhonchi  ABDOMEN:  Soft, non-tender, non-distended MUSCULOSKELETAL:  No edema; No deformity  SKIN: Warm and dry NEUROLOGIC:  Alert and oriented x 3 PSYCHIATRIC:  Normal affect   ASSESSMENT:    1. Coronary artery disease involving native coronary artery of native heart without angina pectoris   2. Essential hypertension, benign   3. PAT (paroxysmal atrial tachycardia) ([place])   4. PVC's (premature ventricular contractions)   5. Hyperlipidemia LDL goal <70    PLAN:    In order of problems listed above:  1.  ASCAD - cath showed 50-70% mid LAD proximal to the D1 and 75-80% mid OM with luminal irregularities in the mid RCA with normal LVF.  She will remain on medical therapy.  If she has recurrent angina or ischemia in anterior wall then would consider PCI of the OM.  It was felt that the LAD would be higher risk due to significant calcification and angulation.  She will continue on ASA and BB.  2.  HTN - BP is well controlled on exam today.  She will continue on Bystolic 2.5mg  daily and Losartan-HCT 100-25mg  daily.    3.  PAT - she is s/p ablation with no reoccurrence of her arrhythmia.  She will continue on BB.    4.  PVCs - these are well suppressed on BB.  She will continue on Bystolic.   5.  Hyperlipidemia with LDL goal < 70.  She will continue on     Medication Adjustments/Labs and Tests Ordered: Current medicines are reviewed at length with the patient  today.  Concerns regarding medicines are outlined above.  No orders of the defined types were placed in this encounter.  No orders of the defined types were placed in this encounter.   Signed, Fransico Him, MD  04/03/2017 8:38 PM    [place]

## 2017-04-04 ENCOUNTER — Ambulatory Visit: Payer: Self-pay | Admitting: Cardiology

## 2017-04-04 NOTE — Telephone Encounter (Signed)
Attempted to contact pt. No answer, no option to leave a message. Will try back.  

## 2017-04-05 NOTE — Telephone Encounter (Signed)
Attempted to call pt but line went directly to vm. Pt's vm box full so unable to leave message. Per triage protocol, due to third attempt to reach out to pt and unable to do so, will close this encounter.

## 2017-04-07 ENCOUNTER — Other Ambulatory Visit (HOSPITAL_COMMUNITY): Payer: Self-pay | Admitting: Psychiatry

## 2017-04-07 ENCOUNTER — Ambulatory Visit (INDEPENDENT_AMBULATORY_CARE_PROVIDER_SITE_OTHER): Payer: Medicare Other | Admitting: Psychiatry

## 2017-04-07 ENCOUNTER — Encounter (HOSPITAL_COMMUNITY): Payer: Self-pay | Admitting: Psychiatry

## 2017-04-07 DIAGNOSIS — F411 Generalized anxiety disorder: Secondary | ICD-10-CM

## 2017-04-07 DIAGNOSIS — R454 Irritability and anger: Secondary | ICD-10-CM

## 2017-04-07 DIAGNOSIS — F3130 Bipolar disorder, current episode depressed, mild or moderate severity, unspecified: Secondary | ICD-10-CM | POA: Diagnosis not present

## 2017-04-07 DIAGNOSIS — F1099 Alcohol use, unspecified with unspecified alcohol-induced disorder: Secondary | ICD-10-CM | POA: Diagnosis not present

## 2017-04-07 DIAGNOSIS — R110 Nausea: Secondary | ICD-10-CM

## 2017-04-07 DIAGNOSIS — F4310 Post-traumatic stress disorder, unspecified: Secondary | ICD-10-CM

## 2017-04-07 DIAGNOSIS — Z560 Unemployment, unspecified: Secondary | ICD-10-CM

## 2017-04-07 DIAGNOSIS — Z811 Family history of alcohol abuse and dependence: Secondary | ICD-10-CM

## 2017-04-07 DIAGNOSIS — Z818 Family history of other mental and behavioral disorders: Secondary | ICD-10-CM

## 2017-04-07 DIAGNOSIS — R4584 Anhedonia: Secondary | ICD-10-CM | POA: Diagnosis not present

## 2017-04-07 MED ORDER — LEVOTHYROXINE SODIUM 88 MCG PO TABS
ORAL_TABLET | ORAL | Status: DC
Start: 2017-04-06 — End: 2017-04-07

## 2017-04-07 MED ORDER — CARVEDILOL 6.25 MG PO TABS
3.13 | ORAL_TABLET | ORAL | Status: DC
Start: 2017-04-05 — End: 2017-04-07

## 2017-04-07 MED ORDER — ONDANSETRON HCL 4 MG PO TABS
4.00 | ORAL_TABLET | ORAL | Status: DC
Start: ? — End: 2017-04-07

## 2017-04-07 MED ORDER — ANTACID & ANTIGAS 200-200-20 MG/5ML PO SUSP
30.00 | ORAL | Status: DC
Start: ? — End: 2017-04-07

## 2017-04-07 MED ORDER — ONDANSETRON HCL 4 MG/2ML IJ SOLN
4.00 | INTRAMUSCULAR | Status: DC
Start: ? — End: 2017-04-07

## 2017-04-07 MED ORDER — BISACODYL 5 MG PO TBEC
5.00 | DELAYED_RELEASE_TABLET | ORAL | Status: DC
Start: ? — End: 2017-04-07

## 2017-04-07 MED ORDER — LORAZEPAM 1 MG PO TABS: 1.0000 mg | ORAL_TABLET | Freq: Two times a day (BID) | ORAL | 0 refills | Status: DC | PRN

## 2017-04-07 MED ORDER — IBUPROFEN 600 MG PO TABS
600.00 | ORAL_TABLET | ORAL | Status: DC
Start: ? — End: 2017-04-07

## 2017-04-07 MED ORDER — THERA PO TABS
ORAL_TABLET | ORAL | Status: DC
Start: 2017-04-06 — End: 2017-04-07

## 2017-04-07 MED ORDER — ASPIRIN EC 81 MG PO TBEC
81.00 | DELAYED_RELEASE_TABLET | ORAL | Status: DC
Start: 2017-04-06 — End: 2017-04-07

## 2017-04-07 MED ORDER — TRAZODONE HCL 50 MG PO TABS
50.00 | ORAL_TABLET | ORAL | Status: DC
Start: ? — End: 2017-04-07

## 2017-04-07 MED ORDER — VITAMIN D3 25 MCG (1000 UT) PO TABS
ORAL_TABLET | ORAL | Status: DC
Start: 2017-04-06 — End: 2017-04-07

## 2017-04-07 MED ORDER — BENZTROPINE MESYLATE 1 MG PO TABS
0.50 | ORAL_TABLET | ORAL | Status: DC
Start: ? — End: 2017-04-07

## 2017-04-07 MED ORDER — DIVALPROEX SODIUM ER 500 MG PO TB24: 1000.0000 mg | ORAL_TABLET | Freq: Every day | ORAL | 1 refills | Status: DC

## 2017-04-07 MED ORDER — MAGNESIUM HYDROXIDE 400 MG/5ML PO SUSP
15.00 | ORAL | Status: DC
Start: ? — End: 2017-04-07

## 2017-04-07 MED ORDER — LURASIDONE HCL 40 MG PO TABS
40.00 | ORAL_TABLET | ORAL | Status: DC
Start: 2017-04-05 — End: 2017-04-07

## 2017-04-07 MED ORDER — GENERIC EXTERNAL MEDICATION
Status: DC
Start: 2017-04-06 — End: 2017-04-07

## 2017-04-07 MED ORDER — LURASIDONE HCL 40 MG PO TABS: 40.0000 mg | ORAL_TABLET | Freq: Every day | ORAL | 1 refills | Status: DC

## 2017-04-07 MED ORDER — LORAZEPAM 1 MG PO TABS
1.00 | ORAL_TABLET | ORAL | Status: DC
Start: ? — End: 2017-04-07

## 2017-04-07 MED ORDER — DIVALPROEX SODIUM ER 500 MG PO TB24
1000.00 | ORAL_TABLET | ORAL | Status: DC
Start: 2017-04-06 — End: 2017-04-07

## 2017-04-07 MED ORDER — OLANZAPINE 5 MG PO TBDP
2.50 | ORAL_TABLET | ORAL | Status: DC
Start: ? — End: 2017-04-07

## 2017-04-07 NOTE — Progress Notes (Signed)
[facility] MD/PA/NP OP Progress Note  04/07/2017 11:42 AM [name]  MRN:  [id number]  Chief Complaint:  Chief Complaint    Depression; Follow-up     HPI: Pt was hospitalized for 5 days at [facility] psychiatry unit in Jan 10-15, 2019 due to severe depression. She was d/c yesterday and has not yet filled her scripts.  Pt feels it was "terrible" experience as the nursing staff was chaotic and rude. It was irritating and didn't get much relief. Some of the groups were helpful. Pt was started on Latuda and Depakote was increased to 1000mg . Pt states she will never go back. Since d/c she has returned to stressors and is very irritable.   Pt states she was not able to tolerate the Rexulti as it made her depression worse and she was experiencing SE of tongue clicking, ankle swelling and increased irritability. Pt states most of this has resolved.   Pt states now she is depressed b/c she is experiencing pain and not being able to doing things. It is a little better. She is no longer staying in bed. She has ongoing anhedonia. Pt is sleeping well and is not stress eating. Pt is not isolating as much and is reaching to a friend for support. Pt denies SI/HI.   Pt denies manic and hypomanic symptoms including periods of decreased need for sleep, increased energy, mood lability, impulsivity, FOI, and excessive spending.  Due to everything that has happened she is not feeling or focused on her anxiety. She is taking Ativan twice a day and it keeps her calm.  Pt had a flashback on the night she was admitted to [facility] but no other signs of PTSD.  Pt states-taking meds as prescribed and denies SE. Today no nausea with Latuda.    Visit Diagnosis:    ICD-10-CM   1. Bipolar I disorder, most recent episode depressed (HCC) F31.30 lurasidone (LATUDA) 40 MG TABS tablet    divalproex (DEPAKOTE ER) 500 MG 24 hr tablet  2. GAD (generalized anxiety disorder) F41.1 LORazepam (ATIVAN) 1 MG tablet  3. PTSD  (post-traumatic stress disorder) F43.10 LORazepam (ATIVAN) 1 MG tablet      Past Psychiatric History:  Anxiety: Yes Bipolar Disorder: Yes Depression: Yes Mania: Yes Psychosis: No Schizophrenia: No Personality Disorder: No Hospitalization for psychiatric illness: Yes History of Electroconvulsive Shock Therapy: No Prior Suicide Attempts: No Previous meds- Rexulti unable to tolerate, Trazodone-unable to tolerate; Pristiq-ineffective, Wellbutrin-ineffective, Lamictal was ok but seemed ineffective; Lithium   Past Medical History:  Past Medical History:  Diagnosis Date  . Abscess of Bartholin's gland   . Acute vestibular neuronitis    Dr Lucia Gaskins  . ADHD (attention deficit hyperactivity disorder)   . Adverse effect of general anesthetic    felt paralyzed while receiving anesthesia  . Anxiety   . Bipolar 1 disorder ([place])   . CAD (coronary artery disease), native coronary artery    cath 05/2016 showing 50-70% stenosis in the mid LAD proximal to the first diagonal and 70-80% small OM1. FFR of LAD  not performed because of difficulty with catheter control from the right radial.   . Chronic kidney disease    kidney cancer- pt states she has elected to not have it treated.  . Depression    Bipolar disorder/goes to [facility] for meds  . Difficult intubation    told by [employer] that she was hard to intubate 15b yrs ago in [place]- surgery since then no problems  . GERD (gastroesophageal reflux  disease)   . H/O echocardiogram 07/2012   Normla LVF w grade I siastolic dysfunction   . History of attention deficit disorder   . History of endometriosis   . History of pleural effusion   . Hyperlipidemia LDL goal <70 06/24/2016  . Hypertension   . 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 Memory difficulty 01/26/2017  . Migraines   . Obesity   . OSA (obstructive sleep apnea)    uses oral appliance instead of CPAP  . PAT (paroxysmal atrial tachycardia) (HCC)    s/p  ablation  . PONV (postoperative nausea and vomiting)   . PTSD (post-traumatic stress disorder)   . PVC's (premature ventricular contractions)   . Renal mass 02/15/2012  . RLS (restless legs syndrome)    Dr Gwenette Greet  . rt renal ca dx'd 12/2009   no treatment/ no surg  . Vertigo     Past Surgical History:  Procedure Laterality Date  . CARDIAC ELECTROPHYSIOLOGY [place] AND ABLATION  2000s  . LEFT HEART CATH AND CORONARY ANGIOGRAPHY N/A 06/08/2016   Procedure: Left Heart Cath and Coronary Angiography;  Surgeon: Belva Crome, MD;  Location: [facility];  Service: Cardiovascular;  Laterality: N/A;  . nasoseptal reconstruction  1990s  . THYROIDECTOMY  1990  . TONSILLECTOMY     as a child  . TUBAL LIGATION  1970s  . uterine mass removal  03/2012   was found to be benign    Family Psychiatric History:  Family History  Problem Relation Age of Onset  . Allergies Father   . Skin cancer Father   . Bipolar disorder Father   . Alcohol abuse Father   . Heart disease Mother   . Depression Mother   . Hypertension Mother   . CAD Mother   . Colon cancer Paternal Grandmother   . OCD Paternal Grandmother   . Allergies Sister        multiple  . Allergies Brother        multiple  . Allergies Daughter   . Heart disease Maternal Grandfather   . Cancer Paternal Grandfather     Social History:  Social History   Socioeconomic History  . Marital status: Single    Spouse name: Not on file  . Number of children: 1  . Years of education: 80  . Highest education level: Not on file  Social Needs  . Financial resource strain: Not on file  . Food insecurity - worry: Not on file  . Food insecurity - inability: Not on file  . Transportation needs - medical: Not on file  . Transportation needs - non-medical: Not on file  Occupational History  . 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 Smoking status: Never Smoker  . Smokeless tobacco: Never Used  Substance and  Sexual Activity  . Alcohol use: Yes    Alcohol/week: 0.6 oz    Types: 1 Glasses of wine per week  . Drug use: No  . Sexual activity: Yes    Partners: Male    Birth control/protection: None  Other Topics Concern  . Not on file  Social History Narrative   Divorced and lives alone   Has children   Caffeine use: none   Right handed     Allergies:  Allergies  Allergen Reactions  . Geodon [Ziprasidone Hydrochloride] Other (See Comments)    Extremely agitated  . Lithium Nausea Only and Other (See Comments)  Off balance, increased heart rate  . Talwin [Pentazocine] Other (See Comments)    Hallucinations   . Toradol [Ketorolac Tromethamine] Other (See Comments)    Chest pains  . Abilify [Aripiprazole] Other (See Comments)    jerking  . Compazine [Prochlorperazine Edisylate] Nausea And Vomiting  . Pristiq [Desvenlafaxine Succinate Er] Other (See Comments)    Did not work, prefers not to take  . Rexulti [Brexpiprazole] Swelling    Elevated BP, created aggression  . Diflucan [Fluconazole] Nausea And Vomiting    Metabolic Disorder Labs: No results found for: HGBA1C, MPG No results found for: PROLACTIN Lab Results  Component Value Date   CHOL 167 04/21/2013   TRIG 106 04/21/2013   HDL 44 04/21/2013   CHOLHDL 3.8 04/21/2013   VLDL 21 04/21/2013   LDLCALC 102 (H) 04/21/2013   Lab Results  Component Value Date   TSH 2.109 04/21/2013   TSH 5.271 (H) 07/01/2012    Therapeutic Level Labs: No results found for: LITHIUM Lab Results  Component Value Date   VALPROATE 15 (L) 03/31/2017   No components found for:  CBMZ  Current Medications: Current Outpatient Medications  Medication Sig Dispense Refill  . aspirin 81 MG EC tablet Take 1 tablet (81 mg total) by mouth daily. For heart health 30 tablet 1  . Brexpiprazole (REXULTI) 0.5 MG TABS Take 1 tablet (0.5 mg total) by mouth daily. (Patient not taking: Reported on 03/30/2017) 30 tablet 0  . cholecalciferol (VITAMIN D) 1000  units tablet Take 2,000 Units by mouth daily.    Marland Kitchen dimenhyDRINATE (DRAMAMINE) 50 MG tablet Take 50 mg by mouth every 8 (eight) hours as needed for nausea or dizziness.    . diphenhydrAMINE (BENADRYL) 25 MG tablet Take 25 mg by mouth 2 (two) times daily as needed for allergies.    Marland Kitchen divalproex (DEPAKOTE ER) 250 MG 24 hr tablet Take 3 tablets (750 mg total) by mouth daily. 90 tablet 0  . ibuprofen (ADVIL,MOTRIN) 200 MG tablet Take 600-800 mg by mouth every 6 (six) hours as needed (pain).     Marland Kitchen levothyroxine (SYNTHROID, LEVOTHROID) 88 MCG tablet Take 1 tablet (88 mcg total) by mouth daily before breakfast. For low thyroid function    . LORazepam (ATIVAN) 1 MG tablet Take 1 tablet (1 mg total) by mouth 2 (two) times daily as needed for anxiety. 60 tablet 0  . losartan-hydrochlorothiazide (HYZAAR) 100-25 MG tablet Take 1 tablet by mouth daily.  2  . Magnesium 500 MG TABS Take 500 mg by mouth daily.    . nebivolol (BYSTOLIC) 2.5 MG tablet Take 1 tablet (2.5 mg total) by mouth daily. 90 tablet 0  . rOPINIRole (REQUIP) 0.5 MG tablet TAKE 1 TABLET BY MOUTH IN THE AFTERNOON AND 1 TABLET AT BEDTIME (Patient taking differently: TAKE 1 TABLET BY MOUTH IN THE AFTERNOON AND 1 TABLET AT BEDTIME AS NEEDED for restless legs) 30 tablet 1   No current facility-administered medications for this visit.      Musculoskeletal: Strength & Muscle Tone: within normal limits Gait & Station: normal Patient leans: N/A  Psychiatric Specialty Exam: Review of Systems  Gastrointestinal: Positive for nausea. Negative for abdominal pain, heartburn and vomiting.  Psychiatric/Behavioral: Positive for depression. Negative for hallucinations, substance abuse and suicidal ideas. The patient is not nervous/anxious and does not have insomnia.     Blood pressure (!) 145/80, pulse 65, height 5\' 4"  (1.626 m), weight 223 lb (101.2 kg).Body mass index is 38.28 kg/m.  General Appearance: Fairly Groomed  Eye Contact:  Good  Speech:   Clear and Coherent and Normal Rate  Volume:  Normal  Mood:  Depressed  Affect:  Congruent and less so than at previous appt  Thought Process:  Goal Directed and Descriptions of Associations: Intact  Orientation:  Full (Time, Place, and Person)  Thought Content: Logical   Suicidal Thoughts:  No  Homicidal Thoughts:  No  Memory:  Immediate;   Good Recent;   Good Remote;   Good  Judgement:  Fair  Insight:  Shallow  Psychomotor Activity:  Normal  Concentration:  Concentration: Good and Attention Span: Good  Recall:  Good  Fund of Knowledge: Good  Language: Good  Akathisia:  No  Handed:  Right  AIMS (if indicated): not done  Assets:  Communication Skills Desire for Improvement Housing Leisure Time Social Support Transportation  ADL's:  Intact  Cognition: WNL  Sleep:  Good   Screenings: AUDIT     Admission (Discharged) from 04/26/2013 in [facility] Admission (Discharged) from 06/29/2012 in [facility]  Alcohol Use Disorder Identification Test Final Score (AUDIT)  0  0    Mini-Mental     Office Visit from 01/26/2017 in [facility]  Total Score (max 30 points )  28    PHQ2-9     Counselor from 08/06/2015 in [place]  PHQ-2 Total Score  6  PHQ-9 Total Score  26       Assessment and Plan: Bipolar disorder-current episode depressed-severe and recurrent; PTSD; GAD    Medication management with supportive therapy. Risks/benefits and SE of the medication discussed. Pt verbalized understanding and verbal consent obtained for treatment.  Affirm with the patient that the medications are taken as ordered. Patient expressed understanding of how their medications were to be used.   Meds: Ativan 1mg  po BID for GAD Latuda 40mg  qD for Bipolar disorder Depakote ER 1000mg  po qHS for Bipolar disorder  Reviewed pt's discharge summary and will scan into chart  Labs: 04/01/2017  HBA1c 5.6, Lipid panel WNL, TSH 4.27, 04/02/2017 Depakote 42.7 Will scan copy in chart  Therapy: brief supportive therapy provided. Discussed psychosocial stressors in detail.   Encouraged pt to develop daily routine and work on daily goal setting as a way to improve mood symptoms.    Consultations: Encouraged to follow up with therapist Encouraged to follow up with PCP as needed  Pt denies SI and is at an acute low risk for suicide. Patient told to call clinic if any problems occur. Patient advised to go to ER if they should develop SI/HI, side effects, or if symptoms worsen. Has crisis numbers to call if needed. Pt verbalized understanding.  F/up in 1 months or sooner if needed    [name], MD 04/07/2017, 11:42 AM

## 2017-04-11 ENCOUNTER — Ambulatory Visit (HOSPITAL_COMMUNITY): Payer: Medicare Other | Admitting: Psychiatry

## 2017-04-11 ENCOUNTER — Telehealth: Payer: Self-pay | Admitting: Oncology

## 2017-04-11 NOTE — Telephone Encounter (Signed)
Rescheduled missed appts - scheduled in Feb.

## 2017-04-12 ENCOUNTER — Ambulatory Visit (INDEPENDENT_AMBULATORY_CARE_PROVIDER_SITE_OTHER): Payer: Medicare Other | Admitting: Psychiatry

## 2017-04-12 DIAGNOSIS — F3130 Bipolar disorder, current episode depressed, mild or moderate severity, unspecified: Secondary | ICD-10-CM | POA: Diagnosis not present

## 2017-04-18 ENCOUNTER — Telehealth (HOSPITAL_COMMUNITY): Payer: Self-pay

## 2017-04-18 NOTE — Telephone Encounter (Signed)
Patient is calling to ask if you can restart the Ritalin prescription that was d/c'd in November. Please review and advise, thank you

## 2017-04-19 ENCOUNTER — Ambulatory Visit (HOSPITAL_COMMUNITY): Payer: Self-pay | Admitting: Psychiatry

## 2017-04-19 NOTE — Progress Notes (Signed)
   THERAPIST PROGRESS NOTE  Session Time: 3:10-4:00  Participation Level:Active  Behavioral Response:CasualAlertDepressedTearful  Type of Therapy: Individual Therapy  Treatment Goals addressed: Coping  Interventions: solution focused  Summary: [name] a 71y.o.femalewho presents with bipolar disorder   Suicidal/Homicidal:No without intent/plan  Therapist Response:Pt. Presents as very tearful, depressed with her boyfriend. Pt. Discussed her recent inpatient hospitalization in the geriatric unit in [place]. Pt. Discussed that she was treated horribly and that it was not helpful for her. Pt. Believes that she was able to detox from rexulti and started on new anti-depressant that she was not sure was working. Pt. Continues to be troubled by her depression and body pain. Pt. Continues to complain that she is overwhelmed by the condition of her home. Pt.'s boyfriend offered to help with having someone to come and clean as an occasional gift to her.    Plan:Pt. To return in two weeks for follow up appointment.  Diagnosis:Bipolar Disorder  Nancie Neas, [facility] 04/19/2017

## 2017-04-21 ENCOUNTER — Ambulatory Visit (INDEPENDENT_AMBULATORY_CARE_PROVIDER_SITE_OTHER): Payer: Medicare Other | Admitting: Psychiatry

## 2017-04-21 DIAGNOSIS — F4310 Post-traumatic stress disorder, unspecified: Secondary | ICD-10-CM | POA: Diagnosis not present

## 2017-04-21 DIAGNOSIS — R451 Restlessness and agitation: Secondary | ICD-10-CM | POA: Diagnosis not present

## 2017-04-21 DIAGNOSIS — R454 Irritability and anger: Secondary | ICD-10-CM

## 2017-04-21 DIAGNOSIS — Z560 Unemployment, unspecified: Secondary | ICD-10-CM

## 2017-04-21 DIAGNOSIS — F411 Generalized anxiety disorder: Secondary | ICD-10-CM

## 2017-04-21 DIAGNOSIS — M2550 Pain in unspecified joint: Secondary | ICD-10-CM

## 2017-04-21 DIAGNOSIS — F1099 Alcohol use, unspecified with unspecified alcohol-induced disorder: Secondary | ICD-10-CM

## 2017-04-21 DIAGNOSIS — M549 Dorsalgia, unspecified: Secondary | ICD-10-CM

## 2017-04-21 DIAGNOSIS — Z811 Family history of alcohol abuse and dependence: Secondary | ICD-10-CM | POA: Diagnosis not present

## 2017-04-21 DIAGNOSIS — Z818 Family history of other mental and behavioral disorders: Secondary | ICD-10-CM | POA: Diagnosis not present

## 2017-04-21 DIAGNOSIS — F3130 Bipolar disorder, current episode depressed, mild or moderate severity, unspecified: Secondary | ICD-10-CM | POA: Diagnosis not present

## 2017-04-21 NOTE — Progress Notes (Signed)
[facility] MD/PA/NP OP Progress Note  04/21/2017 3:59 PM [name]  MRN:  [id number]  Chief Complaint:  Chief Complaint    Depression; Follow-up     HPI: Pt was last seen 2 weeks ago. Pt states "I have been worse". She states "I get crazy". She is easily agitated and irritated. She can't tolerate people are talking. She is more argumentative than usual. The Ativan doesn't help when she gets like this. She thinks it might be mania. Eiliyah reports she was taking Ritalin and within 45 min she was better.   Pt denies anxiety and depression. Pt has no motivation unless she takes Ritalin. Pt is feeling hopeless. Pt states she is very emotional and cries about everything. She is not getting out much but tries to sit on her rocker in the mornings. Pt states appetite is down and nothing tastes good. She feels like she can't relax. Sleep is generally poor. Pt denies SI/HI.  Pt stopped Latuda and Depakote 2 weeks ago because the [place] was causing SE of swelling. She does not know why she stopped the Depakote.  Pt is not drinking alcohol anymore.   She wants her boyfriend to come in and tell me about how she has been lately. Pt feels she can not tolerate him anymore.   Visit Diagnosis:    ICD-10-CM   1. Bipolar I disorder, most recent episode depressed ([place]) F31.30        Past Psychiatric History:  Anxiety: Yes Bipolar Disorder: Yes Depression: Yes Mania: Yes Psychosis: No Schizophrenia: No Personality Disorder: No Hospitalization for psychiatric illness: Yes History of Electroconvulsive Shock Therapy: No Prior Suicide Attempts: No Previous meds- Rexulti unable to tolerate, Trazodone-unable to tolerate; Pristiq-ineffective, Wellbutrin-ineffective, Lamictal was ok but seemed ineffective; Lithium   Past Medical History:  Past Medical History:  Diagnosis Date  . Abscess of Bartholin's gland   . Acute vestibular neuronitis    Dr Lucia Gaskins  . ADHD (attention deficit hyperactivity disorder)    . Adverse effect of general anesthetic    felt paralyzed while receiving anesthesia  . Anxiety   . Bipolar 1 disorder ([place])   . CAD (coronary artery disease), native coronary artery    cath 05/2016 showing 50-70% stenosis in the mid LAD proximal to the first diagonal and 70-80% small OM1. FFR of LAD  not performed because of difficulty with catheter control from the right radial.   . Chronic kidney disease    kidney cancer- pt states she has elected to not have it treated.  . Depression    Bipolar disorder/goes to [facility] for meds  . Difficult intubation    told by [facility] that she was hard to intubate 15b yrs ago in [place]- surgery since then no problems  . GERD (gastroesophageal reflux disease)   . H/O echocardiogram 07/2012   Normla LVF w grade I siastolic dysfunction   . History of attention deficit disorder   . History of endometriosis   . History of pleural effusion   . Hyperlipidemia LDL goal <70 06/24/2016  . Hypertension   . 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 Memory difficulty 01/26/2017  . Migraines   . Obesity   . OSA (obstructive sleep apnea)    uses oral appliance instead of CPAP  . PAT (paroxysmal atrial tachycardia) (HCC)    s/p ablation  . PONV (postoperative nausea and vomiting)   . PTSD (post-traumatic stress disorder)   . PVC's (premature ventricular contractions)   .  Renal mass 02/15/2012  . RLS (restless legs syndrome)    Dr Gwenette Greet  . rt renal ca dx'd 12/2009   no treatment/ no surg  . Vertigo     Past Surgical History:  Procedure Laterality Date  . CARDIAC ELECTROPHYSIOLOGY [place] AND ABLATION  2000s  . LEFT HEART CATH AND CORONARY ANGIOGRAPHY N/A 06/08/2016   Procedure: Left Heart Cath and Coronary Angiography;  Surgeon: Belva Crome, MD;  Location: [facility];  Service: Cardiovascular;  Laterality: N/A;  . nasoseptal reconstruction  1990s  . THYROIDECTOMY  1990  . TONSILLECTOMY     as a child  . TUBAL  LIGATION  1970s  . uterine mass removal  03/2012   was found to be benign    Family Psychiatric History:  Family History  Problem Relation Age of Onset  . Allergies Father   . Skin cancer Father   . Bipolar disorder Father   . Alcohol abuse Father   . Heart disease Mother   . Depression Mother   . Hypertension Mother   . CAD Mother   . Colon cancer Paternal Grandmother   . OCD Paternal Grandmother   . Allergies Sister        multiple  . Allergies Brother        multiple  . Allergies Daughter   . Heart disease Maternal Grandfather   . Cancer Paternal Grandfather     Social History:  Social History   Socioeconomic History  . Marital status: Single    Spouse name: Not on file  . Number of children: 1  . Years of education: 73  . Highest education level: Not on file  Social Needs  . Financial resource strain: Not on file  . Food insecurity - worry: Not on file  . Food insecurity - inability: Not on file  . Transportation needs - medical: Not on file  . Transportation needs - non-medical: Not on file  Occupational History  . 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 Smoking status: Never Smoker  . Smokeless tobacco: Never Used  Substance and Sexual Activity  . Alcohol use: Yes    Alcohol/week: 0.6 oz    Types: 1 Glasses of wine per week  . Drug use: No  . Sexual activity: Yes    Partners: Male    Birth control/protection: None  Other Topics Concern  . Not on file  Social History Narrative   Divorced and lives alone   Has children   Caffeine use: none   Right handed     Allergies:  Allergies  Allergen Reactions  . Geodon [Ziprasidone Hydrochloride] Other (See Comments)    Extremely agitated  . Lithium Nausea Only and Other (See Comments)    Off balance, increased heart rate  . Talwin [Pentazocine] Other (See Comments)    Hallucinations   . Toradol [Ketorolac Tromethamine] Other (See Comments)    Chest pains  . Abilify  [Aripiprazole] Other (See Comments)    jerking  . Compazine [Prochlorperazine Edisylate] Nausea And Vomiting  . Pristiq [Desvenlafaxine Succinate Er] Other (See Comments)    Did not work, prefers not to take  . Rexulti [Brexpiprazole] Swelling    Elevated BP, created aggression  . Diflucan [Fluconazole] Nausea And Vomiting    Metabolic Disorder Labs: No results found for: HGBA1C, MPG No results found for: PROLACTIN Lab Results  Component Value Date   CHOL 167 04/21/2013   TRIG 106 04/21/2013   HDL 44  04/21/2013   CHOLHDL 3.8 04/21/2013   VLDL 21 04/21/2013   LDLCALC 102 (H) 04/21/2013   Lab Results  Component Value Date   TSH 2.109 04/21/2013   TSH 5.271 (H) 07/01/2012    Therapeutic Level Labs: No results found for: LITHIUM Lab Results  Component Value Date   VALPROATE 15 (L) 03/31/2017   No components found for:  CBMZ  Current Medications: Current Outpatient Medications  Medication Sig Dispense Refill  . acetaminophen (TYLENOL) 500 MG tablet Take 500 mg by mouth every 6 (six) hours as needed.    Marland Kitchen aspirin 81 MG EC tablet Take 1 tablet (81 mg total) by mouth daily. For heart health 30 tablet 1  . cholecalciferol (VITAMIN D) 1000 units tablet Take 2,000 Units by mouth daily.    Marland Kitchen dimenhyDRINATE (DRAMAMINE) 50 MG tablet Take 50 mg by mouth every 8 (eight) hours as needed for nausea or dizziness.    . diphenhydrAMINE (BENADRYL) 25 MG tablet Take 25 mg by mouth 2 (two) times daily as needed for allergies.    Marland Kitchen divalproex (DEPAKOTE ER) 500 MG 24 hr tablet Take 2 tablets (1,000 mg total) by mouth daily. 60 tablet 1  . ibuprofen (ADVIL,MOTRIN) 200 MG tablet Take 600-800 mg by mouth every 6 (six) hours as needed (pain).     Marland Kitchen levothyroxine (SYNTHROID, LEVOTHROID) 88 MCG tablet Take 1 tablet (88 mcg total) by mouth daily before breakfast. For low thyroid function    . LORazepam (ATIVAN) 1 MG tablet Take 1 tablet (1 mg total) by mouth 2 (two) times daily as needed for anxiety.  60 tablet 0  . losartan-hydrochlorothiazide (HYZAAR) 100-25 MG tablet Take 1 tablet by mouth daily.  2  . lurasidone (LATUDA) 40 MG TABS tablet Take 1 tablet (40 mg total) by mouth daily with breakfast. 30 tablet 1  . Magnesium 500 MG TABS Take 500 mg by mouth daily.    . nebivolol (BYSTOLIC) 2.5 MG tablet Take 1 tablet (2.5 mg total) by mouth daily. 90 tablet 0  . rOPINIRole (REQUIP) 0.5 MG tablet TAKE 1 TABLET BY MOUTH IN THE AFTERNOON AND 1 TABLET AT BEDTIME (Patient taking differently: TAKE 1 TABLET BY MOUTH IN THE AFTERNOON AND 1 TABLET AT BEDTIME AS NEEDED for restless legs) 30 tablet 1   No current facility-administered medications for this visit.      Musculoskeletal: Strength & Muscle Tone: within normal limits Gait & Station: normal Patient leans: N/A  Psychiatric Specialty Exam: Review of Systems  Constitutional: Negative for chills and fever.  Musculoskeletal: Positive for back pain and joint pain. Negative for myalgias and neck pain.  Neurological: Negative for weakness.    Blood pressure 111/75, pulse 68, height 5\' 4"  (1.626 m), weight 220 lb (99.8 kg).Body mass index is 37.76 kg/m.  General Appearance: Fairly Groomed  Eye Contact:  Good  Speech:  Clear and Coherent and Normal Rate  Volume:  Normal  Mood:  Irritable  Affect:  Congruent and Tearful  Thought Process:  Goal Directed and Descriptions of Associations: Intact  Orientation:  Full (Time, Place, and Person)  Thought Content: Logical   Suicidal Thoughts:  No  Homicidal Thoughts:  No  Memory:  Immediate;   Good Recent;   Good Remote;   Good  Judgement:  Fair  Insight:  Shallow  Psychomotor Activity:  Normal  Concentration:  Concentration: Good and Attention Span: Good  Recall:  Good  Fund of Knowledge: Good  Language: Good  Akathisia:  No  Handed:  Right  AIMS (if indicated): not done  Assets:  Communication Skills Desire for Improvement  ADL's:  Intact  Cognition: WNL  Sleep:  Good    Screenings: AUDIT     Admission (Discharged) from 04/26/2013 in [facility] Admission (Discharged) from 06/29/2012 in [facility]  Alcohol Use Disorder Identification Test Final Score (AUDIT)  0  0    Mini-Mental     Office Visit from 01/26/2017 in [facility]  Total Score (max 30 points )  28    PHQ2-9     Counselor from 08/06/2015 in [facility]  PHQ-2 Total Score  6  PHQ-9 Total Score  26      I reviewed information below on 04/21/17 and same as previous visits except as noted  Assessment and Plan: Bipolar disorder-current episode depressed-severe and recurrent; GAD; PTSD; r/o Cluster B personality disorder     Medication management with supportive therapy. Risks/benefits and SE of the medication discussed. Pt verbalized understanding and verbal consent obtained for treatment.  Affirm with the patient that the medications are taken as ordered. Patient expressed understanding of how their medications were to be used.    Meds: Ativan 1mg  po BID for GAD D/c Latuda  Restart Depakote ER 1000mg  po qHS for Bipolar disorder I will hold off on restarting Ritalin. She is pushing for another script saying that it is helping but I think she would benefit more from a mood stabilizer right now     Labs: 04/01/2017 HBA1c 5.6, Lipid panel WNL, TSH 4.27, 04/02/2017 Depakote 42.7    Therapy: brief supportive therapy provided. Discussed psychosocial stressors in detail.   Encouraged pt to develop daily routine and work on daily goal setting as a way to improve mood symptoms.      Consultations: Encouraged to follow up with therapist Encouraged to follow up with PCP as needed   Pt denies SI and is at an acute low risk for suicide. Patient told to call clinic if any problems occur. Patient advised to go to ER if they should develop SI/HI, side effects, or if symptoms worsen. Has crisis  numbers to call if needed. Pt verbalized understanding.   F/up in 1 months or sooner if needed     [name], MD 04/21/2017, 3:59 PM

## 2017-04-26 ENCOUNTER — Ambulatory Visit (INDEPENDENT_AMBULATORY_CARE_PROVIDER_SITE_OTHER): Payer: Medicare Other | Admitting: Psychiatry

## 2017-04-26 DIAGNOSIS — F3130 Bipolar disorder, current episode depressed, mild or moderate severity, unspecified: Secondary | ICD-10-CM | POA: Diagnosis not present

## 2017-04-28 NOTE — Progress Notes (Signed)
   THERAPIST PROGRESS NOTE  Session Time:2:10-3:00  Participation Level:Active  Behavioral Response:CasualAlertEuthymicAppropriatelyTearful  Type of Therapy: Individual Therapy  Treatment Goals addressed: Coping  Interventions: solution focused  Summary: [name] a 72y.o.femalewho presents withbipolar disorder  Suicidal/Homicidal:No without intent/plan  Therapist Response:Pt.Presents as euthymic, appropriately tearful. Pt. Appears for without her boyfriend for the first time in last two sessions. Pt. Reports that she is not taking any psychoactive medications and is feeling "ok". Pt. Discussed that she is concerned about having another crisis and will discuss with Dr. Darrall Dears during her next visit. Pt. Discussed the that she is using dry needling for her joint pain and her provider discussed its usefulness for depression and anxiety. I indicated that I was not aware of its indication for mental health, but was not aware of any adverse side affects. Pt. Discussed that her partner has become very ill again and is not available to support her. Session focused on ongoing issue of acceptance of his mental health status and is inability to be of ongoing long-term support for her. Pt. Discussed that she is focused on addressing her physical health with maintenance of her primary care and reducing inflammation and physical pain that is impacting her emotional well-being.   Plan:Pt.To return in two weeks for follow up appointment.  Diagnosis:Bipolar Disorder  Nancie Neas, [facility] 04/28/2017

## 2017-05-02 NOTE — Progress Notes (Deleted)
Cardiology Office Note:    Date:  05/02/2017   ID:  [name], DOB [birth year], MRN [id number]  PCP:  Leighton Ruff, MD  Cardiologist:  No primary care provider on file.    Referring MD: Leighton Ruff, MD   No chief complaint on file.   History of Present Illness:    [name] is a 72 y.o. female with a hx of PVC's, diastolic dysfunction, PAT s/p ablation and HTN.  She also has a history of ASCAD with coronary CTA showing a calcium score of 364 and moderate 1 vessel CAD of the ostial and prox LAD and severe mid stenosis . This was confirmed at cath which showed 50-70% mid LAD proximal to the D1 and 75-80% mid OM with luminal irregularities in the mid RCA with normal LVF.  Medical therapy was recommended first and if continued to have angina or ischemia in anterior wall then would consider PCI of the OM.  It was felt that the LAD would be higher risk due to significant calcification and angulation.  As this was not an ACS, PCI was not performed.   She is here today for followup and is doing well.  She denies any chest pain or pressure, SOB, DOE, PND, orthopnea, LE edema, dizziness, palpitations or syncope. She is compliant with her meds and is tolerating meds with no SE.    Past Medical History:  Diagnosis Date  . Abscess of Bartholin's gland   . Acute vestibular neuronitis    Dr Lucia Gaskins  . ADHD (attention deficit hyperactivity disorder)   . Adverse effect of general anesthetic    felt paralyzed while receiving anesthesia  . Anxiety   . Bipolar 1 disorder ([place])   . CAD (coronary artery disease), native coronary artery    cath 05/2016 showing 50-70% stenosis in the mid LAD proximal to the first diagonal and 70-80% small OM1. FFR of LAD  not performed because of difficulty with catheter control from the right radial.   . Chronic kidney disease    kidney cancer- pt states she has elected to not have it treated.  . Depression    Bipolar disorder/goes to [place]  [facility] for meds  . Difficult intubation    told by [facility] that she was hard to intubate 15b yrs ago in [place]- surgery since then no problems  . GERD (gastroesophageal reflux disease)   . H/O echocardiogram 07/2012   Normla LVF w grade I siastolic dysfunction   . History of attention deficit disorder   . History of endometriosis   . History of pleural effusion   . Hyperlipidemia LDL goal <70 06/24/2016  . Hypertension   . 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 Memory difficulty 01/26/2017  . Migraines   . Obesity   . OSA (obstructive sleep apnea)    uses oral appliance instead of CPAP  . PAT (paroxysmal atrial tachycardia) (HCC)    s/p ablation  . PONV (postoperative nausea and vomiting)   . PTSD (post-traumatic stress disorder)   . PVC's (premature ventricular contractions)   . Renal mass 02/15/2012  . RLS (restless legs syndrome)    Dr Gwenette Greet  . rt renal ca dx'd 12/2009   no treatment/ no surg  . Vertigo     Past Surgical History:  Procedure Laterality Date  . CARDIAC ELECTROPHYSIOLOGY [place] AND ABLATION  2000s  . LEFT HEART CATH AND CORONARY ANGIOGRAPHY N/A 06/08/2016   Procedure: Left Heart Cath and Coronary  Angiography;  Surgeon: Belva Crome, MD;  Location: [facility];  Service: Cardiovascular;  Laterality: N/A;  . nasoseptal reconstruction  1990s  . THYROIDECTOMY  1990  . TONSILLECTOMY     as a child  . TUBAL LIGATION  1970s  . uterine mass removal  03/2012   was found to be benign    Current Medications: No outpatient medications have been marked as taking for the 05/03/17 encounter (Appointment) with Sueanne Margarita, MD.     Allergies:   Geodon [ziprasidone hydrochloride]; Lithium; Talwin [pentazocine]; Toradol [ketorolac tromethamine]; Abilify [aripiprazole]; Compazine [prochlorperazine edisylate]; Pristiq [desvenlafaxine succinate er]; Rexulti [brexpiprazole]; and Diflucan [fluconazole]   Social History   Socioeconomic  History  . Marital status: Single    Spouse name: Not on file  . Number of children: 1  . Years of education: 13  . Highest education level: Not on file  Social Needs  . Financial resource strain: Not on file  . Food insecurity - worry: Not on file  . Food insecurity - inability: Not on file  . Transportation needs - medical: Not on file  . Transportation needs - non-medical: Not on file  Occupational History  . 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 Smoking status: Never Smoker  . Smokeless tobacco: Never Used  Substance and Sexual Activity  . Alcohol use: Yes    Alcohol/week: 0.6 oz    Types: 1 Glasses of wine per week  . Drug use: No  . Sexual activity: Yes    Partners: Male    Birth control/protection: None  Other Topics Concern  . Not on file  Social History Narrative   Divorced and lives alone   Has children   Caffeine use: none   Right handed      Family History: The patient's family history includes Alcohol abuse in her father; Allergies in her brother, daughter, father, and sister; Bipolar disorder in her father; CAD in her mother; Cancer in her paternal grandfather; Colon cancer in her paternal grandmother; Depression in her mother; Heart disease in her maternal grandfather and mother; Hypertension in her mother; OCD in her paternal grandmother; Skin cancer in her father.  ROS:   Please see the history of present illness.    ROS  All other systems reviewed and negative.   EKGs/Labs/Other Studies Reviewed:    The following studies were reviewed today: none  EKG:  EKG is  ordered today.  The ekg ordered today demonstrates ***  Recent Labs: 03/31/2017: ALT 21; BUN 30; Creatinine, Ser 1.07; Hemoglobin 13.5; Platelets 245; Potassium 3.8; Sodium 139   Recent Lipid Panel    Component Value Date/Time   CHOL 167 04/21/2013 0530   TRIG 106 04/21/2013 0530   HDL 44 04/21/2013 0530   CHOLHDL 3.8 04/21/2013 0530   VLDL 21 04/21/2013  0530   LDLCALC 102 (H) 04/21/2013 0530    Physical Exam:    VS:  There were no vitals taken for this visit.    Wt Readings from Last 3 Encounters:  01/26/17 220 lb (99.8 kg)  12/31/16 221 lb (100.2 kg)  10/29/16 210 lb (95.3 kg)     GEN:  Well nourished, well developed in no acute distress HEENT: Normal NECK: No JVD; No carotid bruits LYMPHATICS: No lymphadenopathy CARDIAC: RRR, no murmurs, rubs, gallops RESPIRATORY:  Clear to auscultation without rales, wheezing or rhonchi  ABDOMEN: Soft, non-tender, non-distended MUSCULOSKELETAL:  No edema; No deformity  SKIN: Warm and dry  NEUROLOGIC:  Alert and oriented x 3 PSYCHIATRIC:  Normal affect   ASSESSMENT:    1. Coronary artery disease involving native coronary artery of native heart without angina pectoris   2. Essential hypertension, benign   3. Hyperlipidemia LDL goal <70    PLAN:    In order of problems listed above:  1.  ASCAD - cath showed 50-70% mid LAD proximal to the D1 and 75-80% mid OM with luminal irregularities in the mid RCA with normal LVF.  Medical therapy was recommended first and if continued to have angina or ischemia in anterior wall then would consider PCI of the OM.  It was felt that the LAD would be higher risk due to significant calcification and angulation.  As this was not an ACS, PCI was not performed. She has not had any anginal symptoms since I saw her last.  She will continue on BB and ASA.    2.  HTN - BP is well controlled on current meds.  She will continue on Losartan HCT 100-25mg  daily and  Bystolic 2.5mg  daily.  3.  Hyperlipidemia with LDL goal < 70.  She will continue on      Medication Adjustments/Labs and Tests Ordered: Current medicines are reviewed at length with the patient today.  Concerns regarding medicines are outlined above.  No orders of the defined types were placed in this encounter.  No orders of the defined types were placed in this encounter.   Signed, Fransico Him,  MD  05/02/2017 7:51 PM    [place]

## 2017-05-03 ENCOUNTER — Ambulatory Visit: Payer: Self-pay | Admitting: Cardiology

## 2017-05-03 ENCOUNTER — Ambulatory Visit (INDEPENDENT_AMBULATORY_CARE_PROVIDER_SITE_OTHER): Payer: Medicare Other | Admitting: Psychiatry

## 2017-05-03 ENCOUNTER — Ambulatory Visit: Payer: Self-pay | Admitting: Oncology

## 2017-05-03 DIAGNOSIS — F3130 Bipolar disorder, current episode depressed, mild or moderate severity, unspecified: Secondary | ICD-10-CM | POA: Diagnosis not present

## 2017-05-05 ENCOUNTER — Encounter (HOSPITAL_COMMUNITY): Payer: Self-pay | Admitting: Psychiatry

## 2017-05-05 ENCOUNTER — Ambulatory Visit (HOSPITAL_COMMUNITY): Payer: Self-pay | Admitting: Psychiatry

## 2017-05-05 ENCOUNTER — Ambulatory Visit (INDEPENDENT_AMBULATORY_CARE_PROVIDER_SITE_OTHER): Payer: Medicare Other | Admitting: Psychiatry

## 2017-05-05 VITALS — BP 111/72 | HR 64 | Ht 64.0 in | Wt 219.8 lb

## 2017-05-05 DIAGNOSIS — Z560 Unemployment, unspecified: Secondary | ICD-10-CM

## 2017-05-05 DIAGNOSIS — Z811 Family history of alcohol abuse and dependence: Secondary | ICD-10-CM

## 2017-05-05 DIAGNOSIS — F411 Generalized anxiety disorder: Secondary | ICD-10-CM | POA: Diagnosis not present

## 2017-05-05 DIAGNOSIS — F3130 Bipolar disorder, current episode depressed, mild or moderate severity, unspecified: Secondary | ICD-10-CM | POA: Diagnosis not present

## 2017-05-05 DIAGNOSIS — F1099 Alcohol use, unspecified with unspecified alcohol-induced disorder: Secondary | ICD-10-CM

## 2017-05-05 DIAGNOSIS — R42 Dizziness and giddiness: Secondary | ICD-10-CM | POA: Diagnosis not present

## 2017-05-05 DIAGNOSIS — Z818 Family history of other mental and behavioral disorders: Secondary | ICD-10-CM | POA: Diagnosis not present

## 2017-05-05 DIAGNOSIS — H53149 Visual discomfort, unspecified: Secondary | ICD-10-CM | POA: Diagnosis not present

## 2017-05-05 DIAGNOSIS — F4310 Post-traumatic stress disorder, unspecified: Secondary | ICD-10-CM

## 2017-05-05 DIAGNOSIS — R251 Tremor, unspecified: Secondary | ICD-10-CM

## 2017-05-05 MED ORDER — DIVALPROEX SODIUM ER 500 MG PO TB24: 1000.0000 mg | ORAL_TABLET | Freq: Every day | ORAL | 1 refills | Status: DC

## 2017-05-05 MED ORDER — DIAZEPAM 2 MG PO TABS: 2.0000 mg | ORAL_TABLET | Freq: Two times a day (BID) | ORAL | 1 refills | Status: DC | PRN

## 2017-05-05 NOTE — Progress Notes (Signed)
[place] MD/PA/NP OP Progress Note  05/05/2017 2:41 PM [name]  MRN:  [id number]  Chief Complaint:  Chief Complaint    Depression; Anxiety; Follow-up     HPI: Pt states she is feeling weak and like she wants to lie down. She is feeling lightheaded and dizzy and anxious. She states she started in the waiting room but she has been feeling nauseated and SOB all day. Pt ate lunch around one hour ago. She does have a hx of migraines.  Pt states the anxiety is "terrible and constant". She wakes up with anxiety. Pt is always shaking and is never comfortable. Pt is taking Ativan 1.5mg  but it is not working anymore so she stopped taking it 2 days ago.   Pt states she forgot about Depakote and has not been taking it since the last visit. She is very irritable and is yelling all the time. Pt states everything makes her irritable and she sometimes stopping talking to people. Pt states she "does not know what depression is or not anymore". She is isolating and crying more often. She is having a lot of pain in the legs and knees. She is in PT but it still affects her ability to walk. Pt is sleeping but it is restful. Pt is using her CPAP. Energy is low. Pt denies SI/HI.   Pt denies manic and hypomanic symptoms including periods of decreased need for sleep, increased energy, mood lability, impulsivity, FOI, and excessive spending.  Pt saw someone who resembled her father recently. It caused HV, flashback and intrusive memories. Pt denies nightmares.   Pt states-taking meds as prescribed and denies SE.   Visit Diagnosis:    ICD-10-CM   1. Bipolar I disorder, most recent episode depressed (HCC) F31.30 divalproex (DEPAKOTE ER) 500 MG 24 hr tablet    diazepam (VALIUM) 2 MG tablet  2. PTSD (post-traumatic stress disorder) F43.10 diazepam (VALIUM) 2 MG tablet  3. GAD (generalized anxiety disorder) F41.1 diazepam (VALIUM) 2 MG tablet     Past Psychiatric History:  Anxiety: Yes Bipolar Disorder:  Yes Depression: Yes Mania: Yes Psychosis: No Schizophrenia: No Personality Disorder: No Hospitalization for psychiatric illness: Yes History of Electroconvulsive Shock Therapy: No Prior Suicide Attempts: No Previous meds- Rexulti unable to tolerate, Trazodone-unable to tolerate; Pristiq-ineffective, Wellbutrin-ineffective, Lamictal was ok but seemed ineffective; Lithium   Past Medical History:  Past Medical History:  Diagnosis Date  . Abscess of Bartholin's gland   . Acute vestibular neuronitis    Dr Lucia Gaskins  . ADHD (attention deficit hyperactivity disorder)   . Adverse effect of general anesthetic    felt paralyzed while receiving anesthesia  . Anxiety   . Bipolar 1 disorder ([place])   . CAD (coronary artery disease), native coronary artery    cath 05/2016 showing 50-70% stenosis in the mid LAD proximal to the first diagonal and 70-80% small OM1. FFR of LAD  not performed because of difficulty with catheter control from the right radial.   . Chronic kidney disease    kidney cancer- pt states she has elected to not have it treated.  . Depression    Bipolar disorder/goes to [facility] for meds  . Difficult intubation    told by [facility] that she was hard to intubate 15b yrs ago in [place]- surgery since then no problems  . GERD (gastroesophageal reflux disease)   . H/O echocardiogram 07/2012   [place] LVF w grade I siastolic dysfunction   . History of attention deficit disorder   .  History of endometriosis   . History of pleural effusion   . Hyperlipidemia LDL goal <70 06/24/2016  . Hypertension   . 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 Memory difficulty 01/26/2017  . Migraines   . Obesity   . OSA (obstructive sleep apnea)    uses oral appliance instead of CPAP  . PAT (paroxysmal atrial tachycardia) (HCC)    s/p ablation  . PONV (postoperative nausea and vomiting)   . PTSD (post-traumatic stress disorder)   . PVC's (premature ventricular  contractions)   . Renal mass 02/15/2012  . RLS (restless legs syndrome)    Dr Gwenette Greet  . rt renal ca dx'd 12/2009   no treatment/ no surg  . Vertigo     Past Surgical History:  Procedure Laterality Date  . CARDIAC ELECTROPHYSIOLOGY [place] AND ABLATION  2000s  . LEFT HEART CATH AND CORONARY ANGIOGRAPHY N/A 06/08/2016   Procedure: Left Heart Cath and Coronary Angiography;  Surgeon: Belva Crome, MD;  Location: [facility];  Service: Cardiovascular;  Laterality: N/A;  . nasoseptal reconstruction  1990s  . THYROIDECTOMY  1990  . TONSILLECTOMY     as a child  . TUBAL LIGATION  1970s  . uterine mass removal  03/2012   was found to be benign    Family Psychiatric History:  Family History  Problem Relation Age of Onset  . Allergies Father   . Skin cancer Father   . Bipolar disorder Father   . Alcohol abuse Father   . Heart disease Mother   . Depression Mother   . Hypertension Mother   . CAD Mother   . Colon cancer Paternal Grandmother   . OCD Paternal Grandmother   . Allergies Sister        multiple  . Allergies Brother        multiple  . Allergies Daughter   . Heart disease Maternal Grandfather   . Cancer Paternal Grandfather     Social History:  Social History   Socioeconomic History  . Marital status: Single    Spouse name: None  . Number of children: 1  . Years of education: 60  . Highest education level: None  Social Needs  . Financial resource strain: None  . Food insecurity - worry: None  . Food insecurity - inability: None  . Transportation needs - medical: None  . Transportation needs - non-medical: None  Occupational History  . 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 Smoking status: Never Smoker  . Smokeless tobacco: Never Used  Substance and Sexual Activity  . Alcohol use: Yes    Alcohol/week: 0.6 oz    Types: 1 Glasses of wine per week  . Drug use: No  . Sexual activity: Yes    Partners: Male    Birth  control/protection: None  Other Topics Concern  . None  Social History Narrative   Divorced and lives alone   Has children   Caffeine use: none   Right handed     Allergies:  Allergies  Allergen Reactions  . Geodon [Ziprasidone Hydrochloride] Other (See Comments)    Extremely agitated  . Lithium Nausea Only and Other (See Comments)    Off balance, increased heart rate  . Talwin [Pentazocine] Other (See Comments)    Hallucinations   . Toradol [Ketorolac Tromethamine] Other (See Comments)    Chest pains  . Abilify [Aripiprazole] Other (See Comments)    jerking  .  Compazine [Prochlorperazine Edisylate] Nausea And Vomiting  . Pristiq [Desvenlafaxine Succinate Er] Other (See Comments)    Did not work, prefers not to take  . Rexulti [Brexpiprazole] Swelling    Elevated BP, created aggression  . Diflucan [Fluconazole] Nausea And Vomiting    Metabolic Disorder Labs: No results found for: HGBA1C, MPG No results found for: PROLACTIN Lab Results  Component Value Date   CHOL 167 04/21/2013   TRIG 106 04/21/2013   HDL 44 04/21/2013   CHOLHDL 3.8 04/21/2013   VLDL 21 04/21/2013   LDLCALC 102 (H) 04/21/2013   Lab Results  Component Value Date   TSH 2.109 04/21/2013   TSH 5.271 (H) 07/01/2012    Therapeutic Level Labs: No results found for: LITHIUM Lab Results  Component Value Date   VALPROATE 15 (L) 03/31/2017   No components found for:  CBMZ  Current Medications: Current Outpatient Medications  Medication Sig Dispense Refill  . acetaminophen (TYLENOL) 500 MG tablet Take 500 mg by mouth every 6 (six) hours as needed.    Marland Kitchen aspirin 81 MG EC tablet Take 1 tablet (81 mg total) by mouth daily. For heart health 30 tablet 1  . cholecalciferol (VITAMIN D) 1000 units tablet Take 2,000 Units by mouth daily.    Marland Kitchen dimenhyDRINATE (DRAMAMINE) 50 MG tablet Take 50 mg by mouth every 8 (eight) hours as needed for nausea or dizziness.    . diphenhydrAMINE (BENADRYL) 25 MG tablet Take  25 mg by mouth 2 (two) times daily as needed for allergies.    Marland Kitchen divalproex (DEPAKOTE ER) 500 MG 24 hr tablet Take 2 tablets (1,000 mg total) by mouth daily. 60 tablet 1  . ibuprofen (ADVIL,MOTRIN) 200 MG tablet Take 600-800 mg by mouth every 6 (six) hours as needed (pain).     Marland Kitchen levothyroxine (SYNTHROID, LEVOTHROID) 88 MCG tablet Take 1 tablet (88 mcg total) by mouth daily before breakfast. For low thyroid function    . LORazepam (ATIVAN) 1 MG tablet Take 1 tablet (1 mg total) by mouth 2 (two) times daily as needed for anxiety. 60 tablet 0  . losartan-hydrochlorothiazide (HYZAAR) 100-25 MG tablet Take 1 tablet by mouth daily.  2  . Magnesium 500 MG TABS Take 500 mg by mouth daily.    . nebivolol (BYSTOLIC) 2.5 MG tablet Take 1 tablet (2.5 mg total) by mouth daily. 90 tablet 0  . rOPINIRole (REQUIP) 0.5 MG tablet TAKE 1 TABLET BY MOUTH IN THE AFTERNOON AND 1 TABLET AT BEDTIME 30 tablet 1   No current facility-administered medications for this visit.      Musculoskeletal: Strength & Muscle Tone: within normal limits Gait & Station: normal Patient leans: N/A  Psychiatric Specialty Exam: Review of Systems  Eyes: Positive for photophobia. Negative for blurred vision, double vision and pain.  Neurological: Positive for dizziness and tremors. Negative for speech change and headaches.    Blood pressure 111/72, pulse 64, height 5\' 4"  (1.626 m), weight 219 lb 12.8 oz (99.7 kg).Body mass index is 37.73 kg/m.  General Appearance: Fairly Groomed. Pt lied down on the couch and closed her eyes after 5 min.   Eye Contact:  Good  Speech:  Clear and Coherent and Normal Rate  Volume:  Normal  Mood:  Anxious and Depressed  Affect:  Congruent and Tearful  Thought Process:  Coherent and Descriptions of Associations: Circumstantial  Orientation:  Full (Time, Place, and Person)  Thought Content: Logical   Suicidal Thoughts:  No  Homicidal Thoughts:  No  Memory:  Immediate;   Good Recent;    Good Remote;   Good  Judgement:  Fair  Insight:  Shallow  Psychomotor Activity:  Tremor  Concentration:  Concentration: Good and Attention Span: Good  Recall:  [employer] of Knowledge: Fair  Language: Fair  Akathisia:  No  Handed:  Right  AIMS (if indicated): not done  Assets:  Communication Skills Desire for Improvement Housing  ADL's:  Intact  Cognition: WNL  Sleep:  Poor   Screenings: AUDIT     Admission (Discharged) from 04/26/2013 in [facility] Admission (Discharged) from 06/29/2012 in [facility]  Alcohol Use Disorder Identification Test Final Score (AUDIT)  0  0    Mini-Mental     Office Visit from 01/26/2017 in [facility]  Total Score (max 30 points )  28    PHQ2-9     Counselor from 08/06/2015 in [facility]  PHQ-2 Total Score  6  PHQ-9 Total Score  26     I reviewed the information below on 05/05/17 and agree except where noted  Assessment and Plan: Bipolar disorder-current episode depressed-severe and recurrent, without psychotic features; GAD; PTSD; r/o Cluster B personality disorder      Medication management with supportive therapy. Risks/benefits and SE of the medication discussed. Pt verbalized understanding and verbal consent obtained for treatment.  Affirm with the patient that the medications are taken as ordered. Patient expressed understanding of how their medications were to be used.    Meds: d/c Ativan  Start trial of Valium 2mg  po BID for GAD Restart Depakote ER 1000mg  po qHS for Bipolar disorder I will hold off on restarting Ritalin. She is pushing for another script saying that it is helping but I think she would benefit more from a mood stabilizer right now     Labs: 04/01/2017 HBA1c 5.6, Lipid panel WNL, TSH 4.27, 04/02/2017 Depakote 42.7     Therapy: brief supportive therapy provided. Discussed psychosocial stressors in detail.    Encouraged pt to develop daily routine and work on daily goal setting as a way to improve mood symptoms.      Consultations: Encouraged to follow up with therapist Encouraged to follow up with PCP as needed   Pt denies SI and is at an acute low risk for suicide. Patient told to call clinic if any problems occur. Patient advised to go to ER if they should develop SI/HI, side effects, or if symptoms worsen. Has crisis numbers to call if needed. Pt verbalized understanding.   F/up in 6 weeks or sooner if needed    [name], MD 05/05/2017, 2:41 PM

## 2017-05-05 NOTE — Progress Notes (Signed)
   THERAPIST PROGRESS NOTE    Session Time:2:10-3:00  Participation Level:Active  Behavioral Response:CasualAlertDepressedAppropriatelyTearful  Type of Therapy: Individual Therapy  Treatment Goals addressed: Coping  Interventions: solution focused  Summary: [name] a 71y.o.femalewho presents withbipolar disorder  Suicidal/Homicidal:No without intent/plan  Therapist Response:Pt.Presentsas depressed, appropriately tearful. Pt. Reports that she has struggled with anxiety and depression. Pt. Has been off of her medication for the last few weeks and was hopeful that she would be able to be medication free, but now believes that she needs to be on medication and will request during visit on 2/14. Pt. Reports that she has had difficulty sleeping, poor concentration, poor frustration tolerance, ongoing issues with organization in her home, sadness related to acceptance of her boyfriend's mental illness. Pt. Reports that her blood pressure has been consistently high. Therapist requested CNA to check blood pressure before leaving the office and reading was normal. Pt. Requested a meditation exercise for calming which was done for approximately 15 minutes. Therapist led a heart focused guided meditation and breathing exercise (4-3-8 breathing) and psychoeducation so that Pt. Will be able to repeat the process to help with calming and lowering her blood pressure at home.  Plan:Pt.To return in two weeks for follow up appointment.  Diagnosis:Bipolar Disorder  Nancie Neas, [facility] 05/05/2017

## 2017-05-07 ENCOUNTER — Other Ambulatory Visit (HOSPITAL_COMMUNITY): Payer: Self-pay | Admitting: Psychiatry

## 2017-05-07 ENCOUNTER — Telehealth (HOSPITAL_COMMUNITY): Payer: Self-pay

## 2017-05-07 DIAGNOSIS — F411 Generalized anxiety disorder: Secondary | ICD-10-CM

## 2017-05-07 MED ORDER — LORAZEPAM 1 MG PO TABS: 1.0000 mg | ORAL_TABLET | Freq: Two times a day (BID) | ORAL | 0 refills | Status: DC | PRN

## 2017-05-07 NOTE — Telephone Encounter (Signed)
I will send in Ativan to her pharmacy. Tell her to stop the Valium

## 2017-05-07 NOTE — Telephone Encounter (Signed)
Patient is calling to let you know that she can not take the Valium - it causes paranoia, she felt dizzy and she was very aware of her heartbeat. She wants to go back on the Ativan and would like a new prescription. Please review and advise, thank you.

## 2017-05-07 NOTE — Telephone Encounter (Signed)
I called the patient and let her know, she will pick up new prescription at the pharmacy and bring them the rest of the Xanax for the pharmacy to dispose of.

## 2017-05-10 ENCOUNTER — Telehealth: Payer: Self-pay | Admitting: Oncology

## 2017-05-10 ENCOUNTER — Other Ambulatory Visit: Payer: Self-pay | Admitting: Family Medicine

## 2017-05-10 ENCOUNTER — Ambulatory Visit (INDEPENDENT_AMBULATORY_CARE_PROVIDER_SITE_OTHER): Payer: Medicare Other | Admitting: Psychiatry

## 2017-05-10 DIAGNOSIS — F3130 Bipolar disorder, current episode depressed, mild or moderate severity, unspecified: Secondary | ICD-10-CM

## 2017-05-10 DIAGNOSIS — R197 Diarrhea, unspecified: Secondary | ICD-10-CM

## 2017-05-10 DIAGNOSIS — R195 Other fecal abnormalities: Secondary | ICD-10-CM

## 2017-05-10 DIAGNOSIS — K8020 Calculus of gallbladder without cholecystitis without obstruction: Secondary | ICD-10-CM

## 2017-05-10 DIAGNOSIS — R110 Nausea: Secondary | ICD-10-CM

## 2017-05-10 DIAGNOSIS — C649 Malignant neoplasm of unspecified kidney, except renal pelvis: Secondary | ICD-10-CM

## 2017-05-10 DIAGNOSIS — F319 Bipolar disorder, unspecified: Secondary | ICD-10-CM

## 2017-05-10 DIAGNOSIS — R109 Unspecified abdominal pain: Secondary | ICD-10-CM

## 2017-05-10 NOTE — Telephone Encounter (Signed)
Patient stopped by scheduling to Reschedule her missed appointment from 2/12. Appointment made

## 2017-05-11 ENCOUNTER — Ambulatory Visit: Payer: Self-pay | Admitting: Oncology

## 2017-05-11 ENCOUNTER — Telehealth: Payer: Self-pay | Admitting: *Deleted

## 2017-05-11 ENCOUNTER — Other Ambulatory Visit: Payer: Self-pay

## 2017-05-11 NOTE — Telephone Encounter (Signed)
Received voice mail from patient stating,"I am not able to make it this morning because I am sick and not feeling well. I will call later in the day to reschedule. Thank you."

## 2017-05-12 ENCOUNTER — Ambulatory Visit
Admission: RE | Admit: 2017-05-12 | Discharge: 2017-05-12 | Disposition: A | Discharge status: Home / Self Care | Payer: Medicare Other | Source: Ambulatory Visit | Attending: Family Medicine | Admitting: Family Medicine

## 2017-05-12 ENCOUNTER — Encounter: Payer: Self-pay | Admitting: Cardiology

## 2017-05-12 DIAGNOSIS — R197 Diarrhea, unspecified: Secondary | ICD-10-CM

## 2017-05-12 DIAGNOSIS — R109 Unspecified abdominal pain: Secondary | ICD-10-CM

## 2017-05-12 DIAGNOSIS — K8020 Calculus of gallbladder without cholecystitis without obstruction: Secondary | ICD-10-CM

## 2017-05-12 DIAGNOSIS — R110 Nausea: Secondary | ICD-10-CM

## 2017-05-12 DIAGNOSIS — C649 Malignant neoplasm of unspecified kidney, except renal pelvis: Secondary | ICD-10-CM

## 2017-05-12 DIAGNOSIS — R195 Other fecal abnormalities: Secondary | ICD-10-CM

## 2017-05-13 NOTE — Progress Notes (Signed)
   THERAPIST PROGRESS NOTE  Session Time:2:10-3:00  Participation Level:Active  Behavioral Response:CasualAlertDepressedAppropriatelyTearful  Type of Therapy: Individual Therapy  Treatment Goals addressed: Coping  Interventions: solution focused  Summary: [name] a 71y.o.femalewho presents withbipolar disorder  Suicidal/Homicidal:No without intent/plan  Therapist Response:Pt.continues to presentasdepressed, tearful.Pt. Shared that she continues to be challenged by lack of support from her significant other because of his mental illness. Pt. Discussed that she is taking medication again, but is experiencing minimal relief from her depression. Pt. Complained of ongoing knee pain and difficulty sleeping and feeling of hopelessness that physical therapy has not been helpful for her alleviating her pain. Pt. Discussed that she needs help for her home because she is not able to take care of her home and also is worried about her difficulty engaging in daily self-care and this is beginning to worry her. Briefly discussed Pt.'s options for assisted living. Pt. Became tearful with the thought of assisted living because she does not want to think that her condition has deteriorated to this point. Pt. Discussed feeling very alone as she is isolated from her family and friend group and feels very helpless in reconnecting these relationships.  Plan:Pt.To return in two weeks for follow up appointment.  Diagnosis:Bipolar Disorder    Nancie Neas, [facility] 05/13/2017

## 2017-05-17 ENCOUNTER — Ambulatory Visit (INDEPENDENT_AMBULATORY_CARE_PROVIDER_SITE_OTHER): Payer: Medicare Other | Admitting: Psychiatry

## 2017-05-17 DIAGNOSIS — F3130 Bipolar disorder, current episode depressed, mild or moderate severity, unspecified: Secondary | ICD-10-CM

## 2017-05-18 ENCOUNTER — Other Ambulatory Visit: Payer: Self-pay

## 2017-05-24 ENCOUNTER — Other Ambulatory Visit: Payer: Self-pay | Admitting: Gastroenterology

## 2017-05-24 ENCOUNTER — Ambulatory Visit (INDEPENDENT_AMBULATORY_CARE_PROVIDER_SITE_OTHER): Payer: Medicare Other | Admitting: Psychiatry

## 2017-05-24 DIAGNOSIS — Z630 Problems in relationship with spouse or partner: Secondary | ICD-10-CM | POA: Diagnosis not present

## 2017-05-24 DIAGNOSIS — F319 Bipolar disorder, unspecified: Secondary | ICD-10-CM

## 2017-05-24 DIAGNOSIS — F3130 Bipolar disorder, current episode depressed, mild or moderate severity, unspecified: Secondary | ICD-10-CM

## 2017-05-24 DIAGNOSIS — Z1211 Encounter for screening for malignant neoplasm of colon: Secondary | ICD-10-CM

## 2017-05-24 NOTE — Progress Notes (Signed)
   THERAPIST PROGRESS NOTE  Session Time:1:10-2:00  Participation Level:Active  Behavioral Response:CasualAlertEuthymic  Type of Therapy: Individual Therapy  Treatment Goals addressed: Coping  Interventions: solution focused  Summary: [name] a 71y.o.femalewho presents withbipolar disorder  Suicidal/Homicidal:No without intent/plan  Therapist Response:Pt.presents with significantly improved mood. Pt. Discussed that her knee pain has improved. Pt. Discussed that she is starting to feel better as evidenced as wanting to get dressed and wear bright colors and return of her appetite. Pt. Discussed that she continues to have stomach problems which she discovered is caused by her gallbladder. Pt. Discussed decision of whether to have her gallbladder removed or to alter her diet. Pt. Discussed that this makes her feel sad. Counselor asked her why it makes her feel sad and Pt. Discussed that it causes her to think about her mortality and that she does not have friends and family to take care of her. Pt. Was encouraged to think about the support that she has in her life her partner, neighbors in her apartment complex and her healthcare providers. Pt. Discussed the pros and cons of the surgery and determined that having the surgery would probably cause her less stress than the constant stomach upset that she has been experiencing over the last few months. Pt. Discussed that she has been doing well on the depacote and that it has helped with her anxiety and irritability. Pt. Discussed ongoing stress of acceptance of her partner's mental instability.  Plan:Pt.To return in one week for follow up appointment.  Diagnosis:Bipolar Disorder    Nancie Neas, [facility] 05/24/2017

## 2017-05-26 ENCOUNTER — Ambulatory Visit (INDEPENDENT_AMBULATORY_CARE_PROVIDER_SITE_OTHER): Payer: Medicare Other | Admitting: Pulmonary Disease

## 2017-05-26 ENCOUNTER — Encounter: Payer: Self-pay | Admitting: Pulmonary Disease

## 2017-05-26 DIAGNOSIS — F332 Major depressive disorder, recurrent severe without psychotic features: Secondary | ICD-10-CM | POA: Diagnosis not present

## 2017-05-26 DIAGNOSIS — G4733 Obstructive sleep apnea (adult) (pediatric): Secondary | ICD-10-CM | POA: Diagnosis not present

## 2017-05-26 NOTE — Assessment & Plan Note (Signed)
This seems to be a major perhaps playing into CPAP compliance

## 2017-05-26 NOTE — Patient Instructions (Signed)
CPAP is set at 9 cm and seems to be working well. CPAP supplies will be renewed

## 2017-05-26 NOTE — Assessment & Plan Note (Signed)
I reassured her that CPAP of 9 cm seems to be working well at this pressure.  She has used dental appliance in the past    Weight loss encouraged, compliance with goal of at least 4-6 hrs every night is the expectation. Advised against medications with sedative side effects Cautioned against driving when sleepy - understanding that sleepiness will vary on a day to day basis

## 2017-05-26 NOTE — Progress Notes (Signed)
   Subjective:    Patient ID: [name], female    DOB: Oct 22, 1945, 72 y.o.   MRN: [id number]  HPI  72 yo woman for FU of restless leg syndrome and OSA  She has bipolar disorder and anxiety -on antidepressants for more than 30 years, now off 01/2015  AHI was worse compared to 2004, she was started on CPAP of 9 cm but had poor compliance and this was discontinued by DME  She underwent repeat home sleep study 11/2016 which showed AHI of 12/hour and was started back on CPAP. She has usedDental appliance in the interim  She initially use this regularly but in the last month compliance has dropped off a little bit. She is very tearful today and admits to worsening symptoms of depression and anxiety.  She likes the nasal pillows well and significant other reports minimal leak at night. CPAP download was reviewed which shows excellent control of events on 9 cm with mild leak and average compliance around 3 hours 45 minutes. She feels like the pressure needs to be increased  She was tapered off Prozac and her restless leg symptoms  improved tremendously    Significant tests/ events reviewed  NPSG 2004: AHI 10/hr PSG 07/2015 (214 lbs) AHI 23/h, mild PLMs 23/h but no sig arousals  CPAP titration 09/2015 9 cm  HST 11/2016 AHI 12/h, 7h TST  12/2016 Spirometry - poor effort , ratio 68, FEV1 of 67% and FVC of 75% suggesting mild obstruction  Review of Systems Patient denies significant dyspnea,cough, hemoptysis,  chest pain, palpitations, pedal edema, orthopnea, paroxysmal nocturnal dyspnea, lightheadedness, nausea, vomiting, abdominal or  leg pains      Objective:   Physical Exam  Gen. Pleasant, obese, in no distress ENT - no lesions, no post nasal drip Neck: No JVD, no thyromegaly, no carotid bruits Lungs: no use of accessory muscles, no dullness to percussion, decreased without rales or rhonchi  Cardiovascular: Rhythm regular, heart sounds  normal, no murmurs or  gallops, no peripheral edema Musculoskeletal: No deformities, no cyanosis or clubbing , no tremors       Assessment & Plan:

## 2017-05-27 ENCOUNTER — Other Ambulatory Visit: Payer: Self-pay | Admitting: *Deleted

## 2017-05-27 MED ORDER — NEBIVOLOL HCL 2.5 MG PO TABS: 2.5000 mg | ORAL_TABLET | Freq: Every day | ORAL | 0 refills | Status: DC

## 2017-05-30 ENCOUNTER — Telehealth: Payer: Self-pay

## 2017-05-30 NOTE — Telephone Encounter (Signed)
Called and left a voice mail concerning upcoming appointment. Per 3/11 in basket, also mailed a calender of this appointment.

## 2017-05-31 ENCOUNTER — Ambulatory Visit (INDEPENDENT_AMBULATORY_CARE_PROVIDER_SITE_OTHER): Payer: Medicare Other | Admitting: Psychiatry

## 2017-05-31 DIAGNOSIS — F3130 Bipolar disorder, current episode depressed, mild or moderate severity, unspecified: Secondary | ICD-10-CM | POA: Diagnosis not present

## 2017-05-31 NOTE — Progress Notes (Signed)
   THERAPIST PROGRESS NOTE  Session Time:1:05-1:55  Participation Level:Active  Behavioral Response:CasualAlertEuthymic  Type of Therapy: Individual Therapy  Treatment Goals addressed: Coping  Interventions: solution focused  Summary: [name] a 71y.o.femalewho presents withbipolar disorder  Suicidal/Homicidal:No without intent/plan  Therapist Response:Pt.presents as talkative, engaged in the therapeutic process, primarily euthymic mood, appropriately tearful. Pt. Reports that her self-care is slowly improving, she is recovering from knee pain and moving around better. Pt. Discussed her frustration with her boyfriend who is helping her with driving and appointments, but is less supportive of her than in the past. Pt. Also expressed that he is communicating growing resentment about money with her. Significant time in session was spent around developing patient's perspective of communication patterns between herself and her boyfriend. Pt. Tends to go between patterns of aggression which leaves her feeling guilty and passivity which leaves her feeling angry. Pt. Was encouraged to speak candidly with her boyfriend about issues related to money and expectations about their mutual spending as it has become an recurrent issue in their relationship that is now contributing to her anxiety and depression. Counselor observed and asked Pt. About the connection between her tearfulness and not feeling supported by her partner. Pt. Developed awareness that she continues to not feel emotional support from her partner of several years and the recent money issues are further distancing them from one another.  Plan:Pt.To return in one week for follow up appointment.  Diagnosis:Bipolar Disorder   Nancie Neas, [facility] 05/31/2017

## 2017-06-03 NOTE — Progress Notes (Signed)
   THERAPIST PROGRESS NOTE  Session Time:1:05-1:55  Participation Level:Active  Behavioral Response:CasualAlertEuthymic  Type of Therapy: Individual Therapy  Treatment Goals addressed: Coping  Interventions: solution focused  Summary: [name] a 71y.o.femalewho presents withbipolar disorder  Suicidal/Homicidal:No without intent/plan  Therapist Response:Pt.continues to present as talkative, engaged in the therapeutic process, primarily euthymic mood, appropriately tearful. Pt. Discussed that she continues to move around better and is experiencing relief from knee pain.Pt. Met with gastroenterologist and determined that gallbladder surgery is not needed. Pt. Expressed relief that she will not need surgery. Pt. Discussed ongoing monitoring of kidney cancer and difficulty scheduling follow up appointments with the cancer center that greatly frustrates her. Pt. Discussed recent communication on social media with estranged sister. Pt. Continues to be challenged by loneliness and difficulty maintaining positive connections with her family and friends. Pt discussed that she is feeling less dependent on her boyfriend because her car was repaired and is able to drive. Pt. Discussed ongoing dissatisfaction in this relationship and developing awareness of need to further distance herself from the relationship because it has not been emotionally healthy for a long time.   Plan:Pt.To return intwo weeksfor follow up appointment. Continue with CBT based therapy.  Diagnosis:Bipolar Disorder  [name], [facility] 06/03/2017

## 2017-06-09 ENCOUNTER — Ambulatory Visit: Payer: Self-pay

## 2017-06-14 ENCOUNTER — Ambulatory Visit (INDEPENDENT_AMBULATORY_CARE_PROVIDER_SITE_OTHER): Payer: Medicare Other | Admitting: Psychiatry

## 2017-06-14 DIAGNOSIS — F3130 Bipolar disorder, current episode depressed, mild or moderate severity, unspecified: Secondary | ICD-10-CM

## 2017-06-17 NOTE — Progress Notes (Signed)
   THERAPIST PROGRESS NOTE   Session Time:1:10-2:00  Participation Level:Active  Behavioral Response:CasualAlertEuthymic  Type of Therapy: Individual Therapy  Treatment Goals addressed: Coping skills; emotional regulation  Interventions: solution focused  Summary: [name] a 71y.o.femalewho presents withbipolar disorder  Suicidal/Homicidal:No without intent/plan  Therapist Response:Pt.continues to presentas talkative, engaged in the therapeutic process, primarily euthymic mood, appropriately tearful. Pt. Discussed behavior of stealing food from grocery store buffet. Counselor engaged Pt. In a discussion about the purpose of this behavior in her life. Pt. Admitted to feeling intense loneliness and feeling without purpose in her life. Counselor discussed safer ways that Pt. Can get he attention that she desires in her life and begin to be seen in a secure and intentional way. Pt. Discussed that her relationship with her significant other has improved and she feels somewhat relieved that things are better in this area of her life. Pt. Continues to struggle with her medication and is hopeful that she will find an anti-depressant that will help her.  Plan:Pt.To return intwo weeksfor follow up appointment. Continue with CBT based therapy.  Diagnosis:Bipolar Disorder  [name]   [name], [facility] 06/17/2017

## 2017-06-21 ENCOUNTER — Ambulatory Visit (INDEPENDENT_AMBULATORY_CARE_PROVIDER_SITE_OTHER): Payer: Medicare Other | Admitting: Psychiatry

## 2017-06-21 ENCOUNTER — Inpatient Hospital Stay: Payer: Medicare Other | Attending: Oncology | Admitting: Oncology

## 2017-06-21 ENCOUNTER — Telehealth: Payer: Self-pay

## 2017-06-21 VITALS — BP 116/58 | HR 51 | Temp 97.8°F | Resp 18 | Ht 64.0 in | Wt 213.0 lb

## 2017-06-21 DIAGNOSIS — F3130 Bipolar disorder, current episode depressed, mild or moderate severity, unspecified: Secondary | ICD-10-CM | POA: Diagnosis not present

## 2017-06-21 DIAGNOSIS — F319 Bipolar disorder, unspecified: Secondary | ICD-10-CM | POA: Diagnosis not present

## 2017-06-21 DIAGNOSIS — N2889 Other specified disorders of kidney and ureter: Secondary | ICD-10-CM | POA: Diagnosis present

## 2017-06-21 DIAGNOSIS — N289 Disorder of kidney and ureter, unspecified: Secondary | ICD-10-CM | POA: Diagnosis not present

## 2017-06-21 NOTE — Telephone Encounter (Signed)
Printed avs and calender of upcoming appointment. Per 4/2 los. To return in 1 year MD. only

## 2017-06-21 NOTE — Progress Notes (Signed)
Hematology and Oncology Follow Up Visit  [name] [id number] Apr 05, 1945 72 y.o. 06/21/2017 9:47 AM  CC: Brooke Haste ([name]) Harrington Challenger, M.D.    Principle Diagnosis: 72 year old woman with a renal neoplasm diagnosed in 2011.  She presented with 4.6 x 4.4 x 3.1 cm right renal mass and has refused any intervention at the time.  Current therapy: Active surveillance.  Interim History:  Ms. Luoma is here for a follow-up visit.  Since her last visit, she reports no major changes in her health.  She continues to deal with issues of depression and bipolar disorder.  She remains on Depakote which helps stabilizes her mood however caused her some excessive fatigue and tiredness.  She did have a right upper quadrant pain and ultrasound showed cholelithiasis.  Her appetite remain about the same and weight is stable.  She denies any flank pain or hematuria.  Her performance status and activity level remain unchanged.  She does not report any headaches, blurry vision, syncope or seizures. She had not had any fevers or chills or sweats.  He denies any chest pain, palpitation, orthopnea or leg edema.  He denies any cough, wheezing or hemoptysis.  Has not had any abdominal pain or change in her bowel habits. she does not report any lymphadenopathy or petechiae.  She denies any skin rashes or lesions.  She denies any frequency urgency or hesitancy.  She denies any hematuria.  Rest of her review of systems is negative.  Medications: I have reviewed the patient's current medications. Current Outpatient Medications  Medication Sig Dispense Refill  . acetaminophen (TYLENOL) 500 MG tablet Take 500 mg by mouth every 6 (six) hours as needed.    Marland Kitchen aspirin 81 MG EC tablet Take 1 tablet (81 mg total) by mouth daily. For heart health 30 tablet 1  . cholecalciferol (VITAMIN D) 1000 units tablet Take 2,000 Units by mouth daily.    Marland Kitchen dimenhyDRINATE (DRAMAMINE) 50 MG tablet Take 50 mg by mouth every 8 (eight) hours as needed for  nausea or dizziness.    . diphenhydrAMINE (BENADRYL) 25 MG tablet Take 25 mg by mouth 2 (two) times daily as needed for allergies.    Marland Kitchen divalproex (DEPAKOTE ER) 500 MG 24 hr tablet Take 2 tablets (1,000 mg total) by mouth daily. 60 tablet 1  . ibuprofen (ADVIL,MOTRIN) 200 MG tablet Take 600-800 mg by mouth every 6 (six) hours as needed (pain).     Marland Kitchen levothyroxine (SYNTHROID, LEVOTHROID) 88 MCG tablet Take 1 tablet (88 mcg total) by mouth daily before breakfast. For low thyroid function    . LORazepam (ATIVAN) 1 MG tablet Take 1 tablet (1 mg total) by mouth 2 (two) times daily as needed for anxiety. 60 tablet 0  . losartan-hydrochlorothiazide (HYZAAR) 100-25 MG tablet Take 1 tablet by mouth daily.  2  . Magnesium 500 MG TABS Take 500 mg by mouth daily.    . nebivolol (BYSTOLIC) 2.5 MG tablet Take 1 tablet (2.5 mg total) by mouth daily. 30 tablet 0  . rOPINIRole (REQUIP) 0.5 MG tablet TAKE 1 TABLET BY MOUTH IN THE AFTERNOON AND 1 TABLET AT BEDTIME (Patient taking differently: Take 1 tablet as needed for sleep) 30 tablet 1   No current facility-administered medications for this visit.     Allergies:  Allergies  Allergen Reactions  . Geodon [Ziprasidone Hydrochloride] Other (See Comments)    Extremely agitated  . Lithium Nausea Only and Other (See Comments)    Off balance, increased heart rate  .  Talwin [Pentazocine] Other (See Comments)    Hallucinations   . Toradol [Ketorolac Tromethamine] Other (See Comments)    Chest pains  . Abilify [Aripiprazole] Other (See Comments)    jerking  . Compazine [Prochlorperazine Edisylate] Nausea And Vomiting  . Pristiq [Desvenlafaxine Succinate Er] Other (See Comments)    Did not work, prefers not to take  . Rexulti [Brexpiprazole] Swelling    Elevated BP, created aggression  . Diflucan [Fluconazole] Nausea And Vomiting   Social history, family history and surgical history are reviewed and remain unchanged.   Physical Exam:  ECOG: 1 General  appearance: Alert, awake woman appeared comfortable. Head: Atraumatic without abnormalities. Eyes: No scleral icterus. Oropharynx without any thrush or ulcers. Lymph nodes: Cervical, supraclavicular, and axillary nodes normal. Heart: Regular rate and rhythm without any murmurs or gallops. Lung: Clear to auscultation without any rhonchi, wheezes or dullness to percussion. Abdomen: Soft, nontender without any rebound or guarding.. Musculoskeletal: No joint deformity or effusion. Skin: No ecchymosis or petechiae.     Lab Results: Lab Results  Component Value Date   WBC 6.6 03/31/2017   HGB 13.5 03/31/2017   HCT 40.5 03/31/2017   MCV 85.6 03/31/2017   PLT 245 03/31/2017     Chemistry      Component Value Date/Time   NA 139 03/31/2017 0044   NA 144 05/11/2016 1450   NA 139 02/18/2015 1440   K 3.8 03/31/2017 0044   K 4.2 02/18/2015 1440   CL 106 03/31/2017 0044   CL 103 07/21/2012 1415   CO2 26 03/31/2017 0044   CO2 29 02/18/2015 1440   BUN 30 (H) 03/31/2017 0044   BUN 21 05/11/2016 1450   BUN 19.1 02/18/2015 1440   CREATININE 1.07 (H) 03/31/2017 0044   CREATININE 0.9 02/18/2015 1440      Component Value Date/Time   CALCIUM 9.7 03/31/2017 0044   CALCIUM 10.2 02/18/2015 1440   ALKPHOS 79 03/31/2017 0044   ALKPHOS 89 02/18/2015 1440   AST 30 03/31/2017 0044   AST 20 02/18/2015 1440   ALT 21 03/31/2017 0044   ALT 16 02/18/2015 1440   BILITOT 0.6 03/31/2017 0044   BILITOT 0.37 02/18/2015 1440      EXAM: ABDOMEN ULTRASOUND COMPLETE  COMPARISON:  CT Abdomen and Pelvis 01/09/2016  FINDINGS: Gallbladder: Shadowing echogenic gallstones are identified, individually measuring up to 19 millimeters diameter. Gallbladder wall thickness remains normal at 2 to 3 millimeters. No pericholecystic fluid. No sonographic Murphy sign elicited.  Common bile duct: Diameter: 5-6 millimeters, upper limits of normal.  Liver: No intrahepatic biliary ductal dilatation. Liver  echogenicity is at the upper limits of normal to increased. No discrete liver lesion. Portal vein is patent on color Doppler imaging with normal direction of blood flow towards the liver.  IVC: No abnormality visualized.  Pancreas: Visualized portion unremarkable.  Spleen: Size and appearance within normal limits.  Right Kidney: Length: 13.1 centimeters. There is a large benign upper pole right renal cyst Re demonstrated. The rounded midpole region solid renal mass is best demonstrated on images 86-90 today and appears stable in size since 2017. No right hydronephrosis.  Left Kidney: Length: 12.2 centimeters. Several simple left renal cysts are Re demonstrated up to 2.8 centimeters diameter. Echogenicity within normal limits. No solid mass or hydronephrosis visualized.  Abdominal aorta: Incompletely visualized due to overlying bowel gas, visualized portions within normal limits.  Other findings: None.  IMPRESSION: 1. Solid right renal mass compatible with chronic renal cell carcinoma is grossly  stable since the 2017 CT Abdomen and Pelvis. 2. Chronic cholelithiasis without evidence of acute cholecystitis or biliary obstruction. 3. No acute findings identified in the abdomen   72 year old woman with the following issues:  1.  Right renal neoplasm diagnosed in 2011: She presented with a large mass that has been stable since that time.  She refused intervention at the time and continues to do so currently.  She declined a biopsy either.  The natural course of this disease and these findings were reviewed with the patient today.  Options of therapy would include definitive therapy with surgery versus a biopsy versus continued active surveillance as we are doing.  Risks and benefits of all these approaches were reviewed again.  After discussion today, she continues to defer any treatment other than observation and surveillance.  2.  Mood disorder: She continues to follow  with psychiatry regarding this issue.  3.  Sleep apnea: She has a CPAP that she has been using periodically.  4.  Follow-up: We will be in 12 months sooner if needed to.  15  minutes was spent with the patient face-to-face today.  More than 50% of time was dedicated to patient counseling, education and answering questions regarding diagnosis and prognosis and future plan of care.    Zola Button 4/2/20199:47 AM

## 2017-06-21 NOTE — Progress Notes (Signed)
   THERAPIST PROGRESS NOTE  Session Time:2:05-3:00  Participation Level:Active  Behavioral Response:CasualAlertEuthymic  Type of Therapy: Individual Therapy  Treatment Goals addressed: Coping skills; emotional regulation  Interventions: solution focused  Summary: Brooke Gafford Sciandrais a 71y.o.femalewho presents withbipolar disorder  Suicidal/Homicidal:No without intent/plan  Therapist Response:Pt.continues to presentas talkative, engaged in the therapeutic process, primarily euthymic mood, appropriately tearful. Pt. Discussed that she continues to have low energy which she attributed to the depacote. Counselor utilized the "miracle question" to assist patient in developing awareness around the root cause of her depression. Pt. Stated that if things were good in her life she would feel loved and supported. Pt. Discussed what is currently keeping her from feeling loved. Significant time was spent Pt.'s current relationship with her significant other. Pt. Discussed that when his mental health is good she feels better physically and emotionally, but when his mental health is poorly managed she feels very depressed and her self-care suffers. Pt. Was encouraged to "act as if" a healthy relationship was available to her and what would that look like in her daily life. Pt. Responded that she would begin to do her hair and make-up again. Pt. Indicated that she would be able to do this regardless of whether she stays in the relationship or not. Pt.also indicated her desire for female friends and discussed returning to mental health association for support and companionship. Pt. Was given current [facility] information and encouraged to schedule an orientation session and begin attending the support group. Pt. Discussed current friendship with a woman she met through [facility] and that she continues to have coffee with this friend.   Plan:Pt.To return intwoweeksfor follow up appointment.  Continue with CBT based therapy.  Diagnosis:Bipolar Disorder  Nancie Neas, LPC    Eloise Levels B, [facility] 06/21/2017

## 2017-06-28 ENCOUNTER — Ambulatory Visit (INDEPENDENT_AMBULATORY_CARE_PROVIDER_SITE_OTHER): Payer: Medicare Other | Admitting: Psychiatry

## 2017-06-28 DIAGNOSIS — F3130 Bipolar disorder, current episode depressed, mild or moderate severity, unspecified: Secondary | ICD-10-CM

## 2017-06-30 ENCOUNTER — Encounter (HOSPITAL_COMMUNITY): Payer: Self-pay | Admitting: Psychiatry

## 2017-06-30 ENCOUNTER — Ambulatory Visit (INDEPENDENT_AMBULATORY_CARE_PROVIDER_SITE_OTHER): Payer: Medicare Other | Admitting: Psychiatry

## 2017-06-30 DIAGNOSIS — F411 Generalized anxiety disorder: Secondary | ICD-10-CM | POA: Diagnosis not present

## 2017-06-30 DIAGNOSIS — Z811 Family history of alcohol abuse and dependence: Secondary | ICD-10-CM

## 2017-06-30 DIAGNOSIS — Z818 Family history of other mental and behavioral disorders: Secondary | ICD-10-CM

## 2017-06-30 DIAGNOSIS — F3130 Bipolar disorder, current episode depressed, mild or moderate severity, unspecified: Secondary | ICD-10-CM

## 2017-06-30 DIAGNOSIS — Z560 Unemployment, unspecified: Secondary | ICD-10-CM

## 2017-06-30 MED ORDER — LORAZEPAM 1 MG PO TABS: 1.0000 mg | ORAL_TABLET | Freq: Two times a day (BID) | ORAL | 1 refills | Status: DC | PRN

## 2017-06-30 MED ORDER — DIVALPROEX SODIUM ER 500 MG PO TB24: 500.0000 mg | ORAL_TABLET | Freq: Every day | ORAL | 1 refills | Status: DC

## 2017-06-30 NOTE — Progress Notes (Signed)
[place] MD/PA/NP OP Progress Note  06/30/2017 3:16 PM [name]  MRN:  [id number]  Chief Complaint:  Chief Complaint    Manic Behavior; Follow-up     HPI: Pt states her life is always so full of things for her to do. Pt states she is taking a step back from her relationship to take care of herself so that is helping. She is not sleeping well at night and it takes her hours to fall asleep and then she wakes up several times a night. Pt has been so tired and spends her days lying around despite decreasing the Depakote to 500mg . Yesterday was the first day she went out by herself in a long while. She is depressed and not sure what she should be feeling. She was started on Cymbalta 2 weeks ago due to pain and thinks it is helping. Pt is very emotional and cries easily. She is not as depressed as she was in January. She always gets better in the spring. Pt denies SI/HI. She admits to feeling unhappy and hopeless. Pt is lonely. She was disappointed when she got news that her tumor was not growing. Pt denies recent manic and hypomanic symptoms including periods of decreased need for sleep, increased energy, mood lability, impulsivity, FOI, and excessive spending. Pt often wakes up with extreme anxiety and sometimes will calm down and go back to sleep and other times takes the Ativan. Pt takes Ativan again if she goes out to stores or [place]. Pt is having HV when she goes out. She has increased startle. Pt denies nightmares and intrusive memories. Pt states-taking meds as prescribed and denies SE.   Visit Diagnosis:    ICD-10-CM   1. Bipolar I disorder, most recent episode depressed (HCC) F31.30 divalproex (DEPAKOTE ER) 500 MG 24 hr tablet  2. GAD (generalized anxiety disorder) F41.1 LORazepam (ATIVAN) 1 MG tablet     Past Psychiatric History:  Anxiety:Yes Bipolar Disorder:Yes Depression:Yes Mania:Yes Psychosis:No Schizophrenia:No Personality Disorder:No Hospitalization for psychiatric  illness:Yes History of Electroconvulsive Shock Therapy:No Prior Suicide Attempts:No Previous meds- Rexulti unable to tolerate, Trazodone-unable to tolerate; Pristiq-ineffective, Wellbutrin-ineffective, Lamictal was ok but seemed ineffective; Lithium     Past Medical History:  Past Medical History:  Diagnosis Date  . Abscess of Bartholin's gland   . Acute vestibular neuronitis    Dr Lucia Gaskins  . ADHD (attention deficit hyperactivity disorder)   . Adverse effect of general anesthetic    felt paralyzed while receiving anesthesia  . Anxiety   . Bipolar 1 disorder ([place])   . CAD (coronary artery disease), native coronary artery    cath 05/2016 showing 50-70% stenosis in the mid LAD proximal to the first diagonal and 70-80% small OM1. FFR of LAD  not performed because of difficulty with catheter control from the right radial.   . Chronic kidney disease    kidney cancer- pt states she has elected to not have it treated.  . Depression    Bipolar disorder/goes to [facility] for meds  . Difficult intubation    told by [facility] that she was hard to intubate 15b yrs ago in [place]- surgery since then no problems  . GERD (gastroesophageal reflux disease)   . H/O echocardiogram 07/2012   [place] LVF w grade I siastolic dysfunction   . History of attention deficit disorder   . History of endometriosis   . History of pleural effusion   . Hyperlipidemia LDL goal <70 06/24/2016  . Hypertension   . 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 Memory difficulty 01/26/2017  . Migraines   . Obesity   . OSA (obstructive sleep apnea)    uses oral appliance instead of CPAP  . PAT (paroxysmal atrial tachycardia) (HCC)    s/p ablation  . PONV (postoperative nausea and vomiting)   . PTSD (post-traumatic stress disorder)   . PVC's (premature ventricular contractions)   . Renal mass 02/15/2012  . RLS (restless legs syndrome)    Dr Gwenette Greet  . rt renal ca dx'd 12/2009   no  treatment/ no surg  . Vertigo     Past Surgical History:  Procedure Laterality Date  . CARDIAC ELECTROPHYSIOLOGY [place] AND ABLATION  2000s  . LEFT HEART CATH AND CORONARY ANGIOGRAPHY N/A 06/08/2016   Procedure: Left Heart Cath and Coronary Angiography;  Surgeon: Belva Crome, MD;  Location: [facility];  Service: Cardiovascular;  Laterality: N/A;  . nasoseptal reconstruction  1990s  . THYROIDECTOMY  1990  . TONSILLECTOMY     as a child  . TUBAL LIGATION  1970s  . uterine mass removal  03/2012   was found to be benign    Family Psychiatric History:  Family History  Problem Relation Age of Onset  . Allergies Father   . Skin cancer Father   . Bipolar disorder Father   . Alcohol abuse Father   . Heart disease Mother   . Depression Mother   . Hypertension Mother   . CAD Mother   . Colon cancer Paternal Grandmother   . OCD Paternal Grandmother   . Allergies Sister        multiple  . Allergies Brother        multiple  . Allergies Daughter   . Heart disease Maternal Grandfather   . Cancer Paternal Grandfather     Social History:  Social History   Socioeconomic History  . Marital status: Single    Spouse name: Not on file  . Number of children: 1  . Years of education: 75  . Highest education level: Not on file  Occupational History  . 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 Financial resource strain: Not on file  . Food insecurity:    Worry: Not on file    Inability: Not on file  . Transportation needs:    Medical: Not on file    Non-medical: Not on file  Tobacco Use  . Smoking status: Never Smoker  . Smokeless tobacco: Never Used  Substance and Sexual Activity  . Alcohol use: Yes    Alcohol/week: 0.6 oz    Types: 1 Glasses of wine per week  . Drug use: No  . Sexual activity: Yes    Partners: Male    Birth control/protection: None  Lifestyle  . Physical activity:    Days per week: Not on file    Minutes per session:  Not on file  . Stress: Not on file  Relationships  . Social connections:    Talks on phone: Not on file    Gets together: Not on file    Attends religious service: Not on file    Active member of club or organization: Not on file    Attends meetings of clubs or organizations: Not on file    Relationship status: Not on file  Other Topics Concern  . Not on file  Social History Narrative   Divorced and lives alone   Has children   Caffeine use:  none   Right handed     Allergies:  Allergies  Allergen Reactions  . Geodon [Ziprasidone Hydrochloride] Other (See Comments)    Extremely agitated  . Lithium Nausea Only and Other (See Comments)    Off balance, increased heart rate  . Talwin [Pentazocine] Other (See Comments)    Hallucinations   . Toradol [Ketorolac Tromethamine] Other (See Comments)    Chest pains  . Abilify [Aripiprazole] Other (See Comments)    jerking  . Compazine [Prochlorperazine Edisylate] Nausea And Vomiting  . Latuda [Lurasidone Hcl] Other (See Comments)    Reports made her mind race more and irritable   . Pristiq [Desvenlafaxine Succinate Er] Other (See Comments)    Did not work, prefers not to take  . Rexulti [Brexpiprazole] Swelling    Elevated BP, created aggression  . Diflucan [Fluconazole] Nausea And Vomiting    Metabolic Disorder Labs: No results found for: HGBA1C, MPG No results found for: PROLACTIN Lab Results  Component Value Date   CHOL 167 04/21/2013   TRIG 106 04/21/2013   HDL 44 04/21/2013   CHOLHDL 3.8 04/21/2013   VLDL 21 04/21/2013   LDLCALC 102 (H) 04/21/2013   Lab Results  Component Value Date   TSH 2.109 04/21/2013   TSH 5.271 (H) 07/01/2012    Therapeutic Level Labs: No results found for: LITHIUM Lab Results  Component Value Date   VALPROATE 15 (L) 03/31/2017   No components found for:  CBMZ  Current Medications: Current Outpatient Medications  Medication Sig Dispense Refill  . acetaminophen (TYLENOL) 500 MG  tablet Take 500 mg by mouth every 6 (six) hours as needed.    Marland Kitchen aspirin 81 MG EC tablet Take 1 tablet (81 mg total) by mouth daily. For heart health 30 tablet 1  . cholecalciferol (VITAMIN D) 1000 units tablet Take 2,000 Units by mouth daily.    Marland Kitchen dimenhyDRINATE (DRAMAMINE) 50 MG tablet Take 50 mg by mouth every 8 (eight) hours as needed for nausea or dizziness.    . diphenhydrAMINE (BENADRYL) 25 MG tablet Take 25 mg by mouth 2 (two) times daily as needed for allergies.    Marland Kitchen divalproex (DEPAKOTE ER) 500 MG 24 hr tablet Take 1 tablet (500 mg total) by mouth daily. 30 tablet 1  . DULoxetine (CYMBALTA) 30 MG capsule Take 60 mg by mouth daily.  0  . ibuprofen (ADVIL,MOTRIN) 200 MG tablet Take 600-800 mg by mouth every 6 (six) hours as needed (pain).     Marland Kitchen levothyroxine (SYNTHROID, LEVOTHROID) 88 MCG tablet Take 1 tablet (88 mcg total) by mouth daily before breakfast. For low thyroid function    . LORazepam (ATIVAN) 1 MG tablet Take 1 tablet (1 mg total) by mouth 2 (two) times daily as needed for anxiety. 60 tablet 1  . losartan-hydrochlorothiazide (HYZAAR) 100-25 MG tablet Take 1 tablet by mouth daily.  2  . Magnesium 500 MG TABS Take 500 mg by mouth daily.    . nebivolol (BYSTOLIC) 2.5 MG tablet Take 1 tablet (2.5 mg total) by mouth daily. 30 tablet 0  . rOPINIRole (REQUIP) 0.5 MG tablet TAKE 1 TABLET BY MOUTH IN THE AFTERNOON AND 1 TABLET AT BEDTIME (Patient taking differently: Take 1 tablet as needed for sleep) 30 tablet 1   No current facility-administered medications for this visit.      Musculoskeletal: Strength & Muscle Tone: within normal limits Gait & Station: normal Patient leans: N/A  Psychiatric Specialty Exam: Review of Systems  Gastrointestinal: Negative for abdominal pain,  heartburn, nausea and vomiting.  Neurological: Negative for dizziness, tingling, tremors, sensory change and headaches.    Blood pressure 110/64, pulse 74, height 5' 3.25" (1.607 m), weight 217 lb (98.4 kg),  SpO2 97 %.Body mass index is 38.14 kg/m.  General Appearance: Fairly Groomed  Eye Contact:  Good  Speech:  Clear and Coherent and Normal Rate  Volume:  Normal  Mood:  Anxious and Depressed  Affect:  Congruent  Thought Process:  Goal Directed and Descriptions of Associations: Intact  Orientation:  Full (Time, Place, and Person)  Thought Content: Logical   Suicidal Thoughts:  No  Homicidal Thoughts:  No  Memory:  Immediate;   Good Recent;   Good Remote;   Good  Judgement:  Good  Insight:  Good  Psychomotor Activity:  Normal  Concentration:  Concentration: Good and Attention Span: Good  Recall:  Good  Fund of Knowledge: Good  Language: Good  Akathisia:  No  Handed:  Right  AIMS (if indicated): not done  Assets:  Communication Skills Desire for Improvement Housing Transportation  ADL's:  Intact  Cognition: WNL  Sleep:  Poor   Screenings: AUDIT     Admission (Discharged) from 04/26/2013 in [facility] Admission (Discharged) from 06/29/2012 in [facility]  Alcohol Use Disorder Identification Test Final Score (AUDIT)  0  0    Mini-Mental     Office Visit from 01/26/2017 in [facility]  Total Score (max 30 points )  28    PHQ2-9     Counselor from 08/06/2015 in [facility]  PHQ-2 Total Score  6  PHQ-9 Total Score  26       Assessment and Plan: Bipolar disorder-current episode depressed, severe and recurrent without psychotic features; GAD; PTSD; cluster B personality disorder    Medication management with supportive therapy. Risks and benefits, side effects and alternative treatment options discussed with patient. Pt was given an opportunity to ask questions about medication, illness, and treatment. All current psychiatric medications have been reviewed and discussed with the patient and adjusted as clinically appropriate. The patient has been provided an accurate and  updated list of the medications being now prescribed. Patient expressed understanding of how their medications were to be used.  Pt verbalized understanding and verbal consent obtained for treatment.  The risk of un-intended pregnancy is low based on the fact that pt reports she is post menopausal. Pt is aware that these meds carry a teratogenic risk. Pt will discuss plan of action if she does or plans to become pregnant in the future.  Status of current problems: ongoing depression and anxiety  Meds: Ativan 1mg  po BID prn anxiety- take one at bedtime Decrease Depakote ER 500 mg p.o. nightly for bipolar disorder. Pt was not able to tolerate the sedation with 1000mg  Cymbalta 60mg  po qD for mood  Labs: none  Therapy: brief supportive therapy provided. Discussed psychosocial stressors in detail.     Consultations: encouraged to follow up with therapist Encouraged to follow up with PCP as needed Pt plans to start classes at [facility]  Pt denies SI and is at an acute low risk for suicide. Patient told to call clinic if any problems occur. Patient advised to go to ER if they should develop SI/HI, side effects, or if symptoms worsen. Has crisis numbers to call if needed. Pt verbalized understanding.  F/up in 2 months or sooner if needed    [name], MD 06/30/2017,  3:15 PM

## 2017-07-04 NOTE — Progress Notes (Signed)
   THERAPIST PROGRESS NOTE Session Time:2:05-2:35  Participation Level:Active  Behavioral Response:CasualAlertEuthymic  Type of Therapy: Individual Therapy  Treatment Goals addressed: [place]; emotional regulation  Interventions: solution focused  Summary: [name] a 71y.o.femalewho presents withbipolar disorder  Suicidal/Homicidal:No without intent/plan  Therapist Response:Pt. presentedas talkative, engaged in the therapeutic process, primarily euthymic mood, appropriately tearful. Pt. Shared that she continues to be challenged by significant body pain, inflammation in her knee. Pt. Discussed that she is doing very well with recent psychiatric medication change, is not a sluggish, and feels that her depression is much better than it has been in several weeks. Pt. Discussed that her relationship with her significant other which is very good this week which she attributes to her good mood. Pt. Discussed that she is very concerned about her landlord's threat to evict her because she took quarters that were lying on a washing machine that she believed belonged to her and a smell that the landlord found coming from her apartment due to her cats. Pt. Was encouraged to seek legal assistance from legal aid regarding her rights related to the eviction and to set up an appointment with the [employer] regarding planning for alternative housing in case she has to move. Pt. Was also encouraged to communicate with her landlord ASAP to get clarity regarding her options for rectifying the situation and options for remaining in the apartment. Pt. Indicated that she understood the importance on receiving legal advice and not remaining passive on the matter.  Plan:Pt.To return intwoweeksfor follow up appointment. Continue with CBT based therapy.     Nancie Neas, [facility] 07/04/2017

## 2017-07-05 ENCOUNTER — Ambulatory Visit (HOSPITAL_COMMUNITY): Payer: Medicare Other | Admitting: Psychiatry

## 2017-07-06 ENCOUNTER — Other Ambulatory Visit: Payer: Self-pay | Admitting: Cardiology

## 2017-07-06 NOTE — Telephone Encounter (Signed)
Left message to call back to regarding Lipitor refill and to schedule f/u appt with Dr. Radford Pax.

## 2017-07-06 NOTE — Telephone Encounter (Signed)
Pt's pharmacy is requesting a refill on atorvastatin. This medication is no longer on pt's medication list and I do not see where Dr. Radford Pax D/C this medication. Please address

## 2017-07-19 ENCOUNTER — Telehealth (HOSPITAL_COMMUNITY): Payer: Self-pay

## 2017-07-19 ENCOUNTER — Ambulatory Visit (INDEPENDENT_AMBULATORY_CARE_PROVIDER_SITE_OTHER): Payer: Medicare Other | Admitting: Psychiatry

## 2017-07-19 DIAGNOSIS — F3130 Bipolar disorder, current episode depressed, mild or moderate severity, unspecified: Secondary | ICD-10-CM

## 2017-07-19 NOTE — Telephone Encounter (Signed)
Patient was prescribed Cymbalta 30 mg daily by PCP, she is doing well on it and Dr. Drema Dallas would like you to continue it. Please review and advise, thank you

## 2017-07-21 NOTE — Telephone Encounter (Signed)
I have been prescribing Cymbalta 60mg . I can continue to manage it but she needs an appt

## 2017-07-21 NOTE — Progress Notes (Signed)
   THERAPIST PROGRESS NOTE  Session Time:2:10-3:00  Participation Level:Active  Behavioral Response:CasualAlertDepressed  Type of Therapy: Individual Therapy  Treatment Goals addressed: [place]; emotional regulation; problem solving  Interventions: solution focused  Summary: [name] a 71y.o.femalewho presents withbipolar disorder  Suicidal/Homicidal:No without intent/plan  Therapist Response:Pt. presentedas talkative, engaged in the therapeutic process, depressed mood, tearful. Pt. Presented as depressed, overwhelmed. Pt. Discussed that she is very concerned that she will soon receive notice of eviction and has been asked by her landlord to provide letter of intention to vacate her apartment. Pt. Was referred by therapist to legal aid for housing law consultation and to [employer] for housing resources in case she is evicted. Pt. Was strongly encouraged to make an appointment as soon as possible as time was a factor. Pt. Discussed that she also is without a cell phone because hers was broken and she had to return it to the manufacturer for a replacement. Pt. Also discussed that she ran into her boyfriend/fiance at a pharmacy and was hurt that he did not ask to leave with her. Pt. Worked through her thoughts of rejection. Pt. Was able to consider that her boyfriend had not planned to spend the day with her and likely assumed that she also had plans for the day and this interaction was not a rejection of her. Pt. Was able to identify a few positive interactions which involved inviting a neighbor to her home for breakfast and running into a friend and engaging him in conversation. Pt. Was acknowledged that these interactions made her feel better and that she would be able to repeat this type of interaction in the future.   Plan:Pt.To return intwoweeksfor follow up appointment. Continue with CBT based therapy.  Diagnosis: Bipolar  1     [name], [facility] 07/21/2017

## 2017-07-22 NOTE — Telephone Encounter (Signed)
Okay, I called patient and let her know, she has an appointment in June

## 2017-07-26 ENCOUNTER — Other Ambulatory Visit (HOSPITAL_COMMUNITY): Payer: Self-pay

## 2017-07-26 ENCOUNTER — Ambulatory Visit (INDEPENDENT_AMBULATORY_CARE_PROVIDER_SITE_OTHER): Payer: Medicare Other | Admitting: Psychiatry

## 2017-07-26 DIAGNOSIS — F3130 Bipolar disorder, current episode depressed, mild or moderate severity, unspecified: Secondary | ICD-10-CM | POA: Diagnosis not present

## 2017-07-26 MED ORDER — DULOXETINE HCL 30 MG PO CPEP
60.0000 mg | ORAL_CAPSULE | Freq: Every day | ORAL | 0 refills | Status: DC
Start: 2017-07-26 — End: 2017-11-10

## 2017-07-27 ENCOUNTER — Ambulatory Visit: Payer: Medicare Other | Admitting: Adult Health

## 2017-07-27 ENCOUNTER — Telehealth: Payer: Self-pay | Admitting: *Deleted

## 2017-07-27 NOTE — Telephone Encounter (Signed)
Patient was no show for follow up with NP today.  

## 2017-07-27 NOTE — Progress Notes (Signed)
   THERAPIST PROGRESS NOTE  Session Time:2:10-3:00  Participation Level:Active  Behavioral Response:CasualAlertDepressed  Type of Therapy: Individual Therapy  Treatment Goals addressed: [place]; emotional regulation; problem solving  Interventions: solution focused  Summary: [name] a 71y.o.femalewho presents withbipolar disorder  Suicidal/Homicidal:No without intent/plan  Therapist Response:Pt. Continues to presentas talkative, engaged in the therapeutic process, depressed mood, and tearful. Pt. Discussed that she is "angry at the world" and very sad. Pt. Stated that it seems that she is always up against a major challenge. Pt. Shared that she moved forward with contacting legal aid and received advice regarding her housing issue. Pt. Also followed up with the section 8 office and received confirmation that her landlord had informed them of their intent not to renew her lease, but that her voucher could be used elsewhere. Pt. Discussed ongoing concerns and dissatisfaction about her relationship with long-term partner feeling very irritable towards him. Pt. Reported as significant that she was confronted by grocery store management for stealing food from the self-service bar. Pt. Indicated that she likely did not feel the appropriate guilt about the incident and was sure that she would return but intended to pay for the food the next time. The Counselor helped Pt. To process the incident as a the need to be seen-not invisible and the need to fill emotional hunger and needs for connection from others. Pt. Indicated that this resonated with her, but mostly resonated with the satisfaction of "getting something for nothing". Pt. Also discussed foraging through the grocery garbage of flowers and visiting a closed plant nursery and digging up bulbs to take home. Pt. Received feedback about meeting these needs in ways that would be less harming to her.    Plan:Pt.To return intwoweeksfor follow up appointment. Continue with CBT based therapy.  Diagnosis: Bipolar 1     Nancie Neas, [name] 07/27/2017

## 2017-07-28 ENCOUNTER — Encounter: Payer: Self-pay | Admitting: Adult Health

## 2017-08-02 ENCOUNTER — Ambulatory Visit (HOSPITAL_COMMUNITY): Payer: Medicare Other | Admitting: Psychiatry

## 2017-08-10 ENCOUNTER — Ambulatory Visit (HOSPITAL_COMMUNITY): Payer: Self-pay | Admitting: Psychiatry

## 2017-08-11 ENCOUNTER — Ambulatory Visit (INDEPENDENT_AMBULATORY_CARE_PROVIDER_SITE_OTHER): Payer: Medicare Other | Admitting: Psychiatry

## 2017-08-11 DIAGNOSIS — F3130 Bipolar disorder, current episode depressed, mild or moderate severity, unspecified: Secondary | ICD-10-CM | POA: Diagnosis not present

## 2017-08-12 NOTE — Progress Notes (Signed)
   THERAPIST PROGRESS NOTE  Session Time:1:10-2:00  Participation Level:Active  Behavioral Response:CasualAlertDepressed  Type of Therapy: Individual Therapy  Treatment Goals addressed: [place]; emotional regulation; problem solving  Interventions: solution focused  Summary: [name] a 71y.o.femalewho presents withbipolar disorder  Suicidal/Homicidal:No without intent/plan  Therapist Response:Pt. Continues to presentas talkative, engaged in the therapeutic process,depressed mood, and tearful. Pt. Discussed ongoing challenge of housing. Pt. Reported that this is the most difficult time that she has experienced. Pt. Discussed pattern of buying food, but then she does not have the appetite to eat it. She received notice that she must leave her apartment and she has until September to find a place or she will use her Section 8 voucher. Pt. Discussed that she has been overwhelmed with her sadness that she has not been able to move forward with plans to move. Counselor worked with Pt. To develop goal of calling one apartment that accepts Section 8 each week. Pt. Discussed that if she is not able to find an apartment that she can live in the spare room of her boyfriend's home. This option is very concerning for her because her boyfriend suffers from severe mental illness and patient has not been disciplined with setting boundaries with him. Pt. Discussed setting a boundary that he cannot be overmedicated in her presence and that she wants the ability to consult on his behalf with his medical providers.    Plan:Pt.To return intwoweeksfor follow up appointment. Continue with CBT based therapy.  Diagnosis:Bipolar 1     Nancie Neas, [place] 08/12/2017

## 2017-08-17 ENCOUNTER — Ambulatory Visit (INDEPENDENT_AMBULATORY_CARE_PROVIDER_SITE_OTHER): Payer: Medicare Other | Admitting: Psychiatry

## 2017-08-17 DIAGNOSIS — F3130 Bipolar disorder, current episode depressed, mild or moderate severity, unspecified: Secondary | ICD-10-CM | POA: Diagnosis not present

## 2017-08-18 NOTE — Progress Notes (Signed)
   THERAPIST PROGRESS NOTE  Session Time:1:10-2:00  Participation Level:Active  Behavioral Response:CasualAlertBrightenedAffect  Type of Therapy: Individual Therapy  Treatment Goals addressed: [place]; emotional regulation; problem solving  Interventions: solution focused  Summary: [name] a 71y.o.femalewho presents withbipolar disorder  Suicidal/Homicidal:No without intent/plan  Therapist Response:Pt. Continues topresentas talkative, engaged in the therapeutic process. Pt. Presented with significantly improved mood compared to the last session. Pt. Discussed that she accepted that she will have to move from her apartment in 2 weeks and have found another Section 8 apartment by September 11 in order to keep her voucher. Pt. Has made a plan to find an apartment and if she has not found one to live with her boyfriend temporarily. Pt. Discussed that she has begun engaging in her artwork and beading which in the past has been an indicator that her depression was lifting. Pt. Also discussed that her appetite has improved and that she feels like cooking again. Pt. Discussed that she has had a conversation with her boyfriend about setting space boundaries when they move in together so that they can get along while living together. Pt. Also discussed plans for packing and moving her apartment. Pt. Stated that she was sleeping better, and felt that she was doing well with her medication.   Plan:Pt.To return intwoweeksfor follow up appointment. Continue with CBT based therapy.  Diagnosis:Bipolar 1     Nancie Neas, [place] 08/18/2017

## 2017-08-24 ENCOUNTER — Ambulatory Visit (INDEPENDENT_AMBULATORY_CARE_PROVIDER_SITE_OTHER): Payer: Medicare Other | Admitting: Psychiatry

## 2017-08-24 DIAGNOSIS — F3130 Bipolar disorder, current episode depressed, mild or moderate severity, unspecified: Secondary | ICD-10-CM | POA: Diagnosis not present

## 2017-08-25 ENCOUNTER — Other Ambulatory Visit: Payer: Self-pay | Admitting: Cardiology

## 2017-08-25 NOTE — Progress Notes (Signed)
   THERAPIST PROGRESS NOTE  Session Time:1:35-2:30  Participation Level:Active  Behavioral Response:CasualAlertBrightenedAffect  Type of Therapy: Individual Therapy  Treatment Goals addressed: [place]; emotional regulation; problem solving  Interventions: solution focused  Summary: [name] a 71y.o.femalewho presents withbipolar disorder  Suicidal/Homicidal:No without intent/plan  Therapist Response:Pt. Continues topresentas talkative, engaged in the therapeutic process. Pt. Continues to present with bright affect, well groomed. Pt. Discussed that she did not get a response from letter that she wrote to her apartment management requesting to stay in her apartment. Pt. Processed that she will likely need to continue planning to leave her apartment.Pt. Has made plans to move into her boyfriend's home and has hired someone to come in and clean his home before her move-in date. Pt. Discussed that she followed with elder law attorney through legal aid and is waiting to hear from them. Pt. Discussed that her cat continues to suffer from thyroid disease and she has made the decision to have her put down and wants to consult with vet about process of doing this. Pt. Was appropriately tearful when discussing the plan of her cat dying, aware that the cat has been a constant companion to her for many years. Pt. Discussed her plans of reconnecting with several friends when she completes relocation to her boyfriend's home.  Plan:Pt.To return intwoweeksfor follow up appointment. Continue with CBT based therapy.  Diagnosis:Bipolar 1      Nancie Neas, [place] 08/25/2017

## 2017-08-31 ENCOUNTER — Ambulatory Visit (INDEPENDENT_AMBULATORY_CARE_PROVIDER_SITE_OTHER): Payer: Medicare Other | Admitting: Psychiatry

## 2017-08-31 DIAGNOSIS — F3130 Bipolar disorder, current episode depressed, mild or moderate severity, unspecified: Secondary | ICD-10-CM

## 2017-09-01 ENCOUNTER — Ambulatory Visit (HOSPITAL_COMMUNITY): Payer: Medicare Other | Admitting: Psychiatry

## 2017-09-01 NOTE — Progress Notes (Deleted)
[facility] MD/PA/NP OP Progress Note  09/01/2017 8:55 AM [name]  MRN:  [id number]  Chief Complaint:  HPI: *** Visit Diagnosis: No diagnosis found.  Past Psychiatric History:  Anxiety:Yes Bipolar Disorder:Yes Depression:Yes Mania:Yes Psychosis:No Schizophrenia:No Personality Disorder:No Hospitalization for psychiatric illness:Yes History of Electroconvulsive Shock Therapy:No Prior Suicide Attempts:No Previous meds- Rexulti unable to tolerate, Trazodone-unable to tolerate; Pristiq-ineffective, Wellbutrin-ineffective, Lamictal was ok but seemed ineffective; Lithium    Past Medical History:  Past Medical History:  Diagnosis Date  . Abscess of Bartholin's gland   . Acute vestibular neuronitis    Dr Lucia Gaskins  . ADHD (attention deficit hyperactivity disorder)   . Adverse effect of general anesthetic    felt paralyzed while receiving anesthesia  . Anxiety   . Bipolar 1 disorder ([place])   . CAD (coronary artery disease), native coronary artery    cath 05/2016 showing 50-70% stenosis in the mid LAD proximal to the first diagonal and 70-80% small OM1. FFR of LAD  not performed because of difficulty with catheter control from the right radial.   . Chronic kidney disease    kidney cancer- pt states she has elected to not have it treated.  . Depression    Bipolar disorder/goes to [facility] for meds  . Difficult intubation    told by [facility] that she was hard to intubate 15b yrs ago in [place]- surgery since then no problems  . GERD (gastroesophageal reflux disease)   . H/O echocardiogram 07/2012   [place] LVF w grade I siastolic dysfunction   . History of attention deficit disorder   . History of endometriosis   . History of pleural effusion   . Hyperlipidemia LDL goal <70 06/24/2016  . Hypertension   . 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 Memory difficulty 01/26/2017  . Migraines   . Obesity   . OSA (obstructive sleep apnea)    uses oral  appliance instead of CPAP  . PAT (paroxysmal atrial tachycardia) (HCC)    s/p ablation  . PONV (postoperative nausea and vomiting)   . PTSD (post-traumatic stress disorder)   . PVC's (premature ventricular contractions)   . Renal mass 02/15/2012  . RLS (restless legs syndrome)    Dr Gwenette Greet  . rt renal ca dx'd 12/2009   no treatment/ no surg  . Vertigo     Past Surgical History:  Procedure Laterality Date  . CARDIAC ELECTROPHYSIOLOGY [place] AND ABLATION  2000s  . LEFT HEART CATH AND CORONARY ANGIOGRAPHY N/A 06/08/2016   Procedure: Left Heart Cath and Coronary Angiography;  Surgeon: Belva Crome, MD;  Location: [facility];  Service: Cardiovascular;  Laterality: N/A;  . nasoseptal reconstruction  1990s  . THYROIDECTOMY  1990  . TONSILLECTOMY     as a child  . TUBAL LIGATION  1970s  . uterine mass removal  03/2012   was found to be benign    Family Psychiatric History:  Family History  Problem Relation Age of Onset  . Allergies Father   . Skin cancer Father   . Bipolar disorder Father   . Alcohol abuse Father   . Heart disease Mother   . Depression Mother   . Hypertension Mother   . CAD Mother   . Colon cancer Paternal Grandmother   . OCD Paternal Grandmother   . Allergies Sister        multiple  . Allergies Brother        multiple  . Allergies Daughter   .  Heart disease Maternal Grandfather   . Cancer Paternal Grandfather     Social History:  Social History   Socioeconomic History  . Marital status: Single    Spouse name: Not on file  . Number of children: 1  . Years of education: 67  . Highest education level: Not on file  Occupational History  . 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 Financial resource strain: Not on file  . Food insecurity:    Worry: Not on file    Inability: Not on file  . Transportation needs:    Medical: Not on file    Non-medical: Not on file  Tobacco Use  . Smoking status: Never Smoker  .  Smokeless tobacco: Never Used  Substance and Sexual Activity  . Alcohol use: Yes    Alcohol/week: 0.6 oz    Types: 1 Glasses of wine per week  . Drug use: No  . Sexual activity: Yes    Partners: Male    Birth control/protection: None  Lifestyle  . Physical activity:    Days per week: Not on file    Minutes per session: Not on file  . Stress: Not on file  Relationships  . Social connections:    Talks on phone: Not on file    Gets together: Not on file    Attends religious service: Not on file    Active member of club or organization: Not on file    Attends meetings of clubs or organizations: Not on file    Relationship status: Not on file  Other Topics Concern  . Not on file  Social History Narrative   Divorced and lives alone   Has children   Caffeine use: none   Right handed     Allergies:  Allergies  Allergen Reactions  . Geodon [Ziprasidone Hydrochloride] Other (See Comments)    Extremely agitated  . Lithium Nausea Only and Other (See Comments)    Off balance, increased heart rate  . Talwin [Pentazocine] Other (See Comments)    Hallucinations   . Toradol [Ketorolac Tromethamine] Other (See Comments)    Chest pains  . Abilify [Aripiprazole] Other (See Comments)    jerking  . Compazine [Prochlorperazine Edisylate] Nausea And Vomiting  . Latuda [Lurasidone Hcl] Other (See Comments)    Reports made her mind race more and irritable   . Pristiq [Desvenlafaxine Succinate Er] Other (See Comments)    Did not work, prefers not to take  . Rexulti [Brexpiprazole] Swelling    Elevated BP, created aggression  . Diflucan [Fluconazole] Nausea And Vomiting    Metabolic Disorder Labs: No results found for: HGBA1C, MPG No results found for: PROLACTIN Lab Results  Component Value Date   CHOL 167 04/21/2013   TRIG 106 04/21/2013   HDL 44 04/21/2013   CHOLHDL 3.8 04/21/2013   VLDL 21 04/21/2013   LDLCALC 102 (H) 04/21/2013   Lab Results  Component Value Date   TSH  2.109 04/21/2013   TSH 5.271 (H) 07/01/2012    Therapeutic Level Labs: No results found for: LITHIUM Lab Results  Component Value Date   VALPROATE 15 (L) 03/31/2017   No components found for:  CBMZ  Current Medications: Current Outpatient Medications  Medication Sig Dispense Refill  . acetaminophen (TYLENOL) 500 MG tablet Take 500 mg by mouth every 6 (six) hours as needed.    Marland Kitchen aspirin 81 MG EC tablet Take 1 tablet (81 mg total) by mouth daily. For heart health  30 tablet 1  . BYSTOLIC 2.5 MG tablet TAKE 1 TABLET BY MOUTH EVERY DAY 30 tablet 0  . cholecalciferol (VITAMIN D) 1000 units tablet Take 2,000 Units by mouth daily.    Marland Kitchen dimenhyDRINATE (DRAMAMINE) 50 MG tablet Take 50 mg by mouth every 8 (eight) hours as needed for nausea or dizziness.    . diphenhydrAMINE (BENADRYL) 25 MG tablet Take 25 mg by mouth 2 (two) times daily as needed for allergies.    Marland Kitchen divalproex (DEPAKOTE ER) 500 MG 24 hr tablet Take 1 tablet (500 mg total) by mouth daily. 30 tablet 1  . DULoxetine (CYMBALTA) 30 MG capsule Take 2 capsules (60 mg total) by mouth daily. 60 capsule 0  . ibuprofen (ADVIL,MOTRIN) 200 MG tablet Take 600-800 mg by mouth every 6 (six) hours as needed (pain).     Marland Kitchen levothyroxine (SYNTHROID, LEVOTHROID) 88 MCG tablet Take 1 tablet (88 mcg total) by mouth daily before breakfast. For low thyroid function    . LORazepam (ATIVAN) 1 MG tablet Take 1 tablet (1 mg total) by mouth 2 (two) times daily as needed for anxiety. 60 tablet 1  . losartan-hydrochlorothiazide (HYZAAR) 100-25 MG tablet Take 1 tablet by mouth daily.  2  . Magnesium 500 MG TABS Take 500 mg by mouth daily.    Marland Kitchen rOPINIRole (REQUIP) 0.5 MG tablet TAKE 1 TABLET BY MOUTH IN THE AFTERNOON AND 1 TABLET AT BEDTIME (Patient taking differently: Take 1 tablet as needed for sleep) 30 tablet 1   No current facility-administered medications for this visit.      Musculoskeletal: Strength & Muscle Tone: {desc; muscle tone:32375} Gait &  Station: {PE GAIT ED OJJK:09381} Patient leans: {Patient Leans:21022755}  Psychiatric Specialty Exam: ROS  There were no vitals taken for this visit.There is no height or weight on file to calculate BMI.  General Appearance: {Appearance:22683}  Eye Contact:  {BHH EYE CONTACT:22684}  Speech:  {Speech:22685}  Volume:  {Volume (PAA):22686}  Mood:  {BHH MOOD:22306}  Affect:  {Affect (PAA):22687}  Thought Process:  {Thought Process (PAA):22688}  Orientation:  {BHH ORIENTATION (PAA):22689}  Thought Content: {Thought Content:22690}   Suicidal Thoughts:  {ST/HT (PAA):22692}  Homicidal Thoughts:  {ST/HT (PAA):22692}  Memory:  {BHH MEMORY:22881}  Judgement:  {Judgement (PAA):22694}  Insight:  {Insight (PAA):22695}  Psychomotor Activity:  {Psychomotor (PAA):22696}  Concentration:  {Concentration:21399}  Recall:  {BHH GOOD/FAIR/POOR:22877}  Fund of Knowledge: {BHH GOOD/FAIR/POOR:22877}  Language: {BHH GOOD/FAIR/POOR:22877}  Akathisia:  {BHH YES OR NO:22294}  Handed:  {Handed:22697}  AIMS (if indicated): {Desc; done/not:10129}  Assets:  {Assets (PAA):22698}  ADL's:  {BHH WEX'H:37169}  Cognition: {chl bhh cognition:304700322}  Sleep:  {BHH GOOD/FAIR/POOR:22877}   Screenings: AUDIT     Admission (Discharged) from 04/26/2013 in [facility] Admission (Discharged) from 06/29/2012 in [facility]  Alcohol Use Disorder Identification Test Final Score (AUDIT)  0  0    Mini-Mental     Office Visit from 01/26/2017 in [facility]  Total Score (max 30 points )  28    PHQ2-9     Counselor from 08/06/2015 in [facility]  PHQ-2 Total Score  6  PHQ-9 Total Score  26      I reviewed the information below on 09/01/2017 and agree except where noted/changed Assessment and Plan: Bipolar disorder-current episode depressed, severe and recurrent without psychotic features; GAD; PTSD; cluster B  personality disorder    Medication management with supportive therapy. Risks and benefits, side effects and  alternative treatment options discussed with patient. Pt was given an opportunity to ask questions about medication, illness, and treatment. All current psychiatric medications have been reviewed and discussed with the patient and adjusted as clinically appropriate. The patient has been provided an accurate and updated list of the medications being now prescribed. Patient expressed understanding of how their medications were to be used.  Pt verbalized understanding and verbal consent obtained for treatment.  The risk of un-intended pregnancy is low based on the fact that pt reports she is post menopausal. Pt is aware that these meds carry a teratogenic risk. Pt will discuss plan of action if she does or plans to become pregnant in the future.  Status of current problems: ongoing depression and anxiety  Meds: Ativan 1mg  po BID prn anxiety- take one at bedtime Decrease Depakote ER 500 mg p.o. nightly for bipolar disorder. Pt was not able to tolerate the sedation with 1000mg  Cymbalta 60mg  po qD for mood  Labs: none  Therapy: brief supportive therapy provided. Discussed psychosocial stressors in detail.     Consultations: encouraged to follow up with therapist Encouraged to follow up with PCP as needed Pt plans to start classes at [facility]  Pt denies SI and is at an acute low risk for suicide. Patient told to call clinic if any problems occur. Patient advised to go to ER if they should develop SI/HI, side effects, or if symptoms worsen. Has crisis numbers to call if needed. Pt verbalized understanding.  F/up in 2 months or sooner if needed      [name], MD 09/01/2017, 8:55 AM

## 2017-09-05 NOTE — Progress Notes (Signed)
   THERAPIST PROGRESS NOTE  Session Time:1:40-2:30  Participation Level:Active  Behavioral Response:CasualAlertBrightenedAffect  Type of Therapy: Individual Therapy  Treatment Goals addressed: [place]; emotional regulation; problem solving  Interventions: solution focused  Summary: [name] a 71y.o.femalewho presents withbipolar disorder  Suicidal/Homicidal:No without intent/plan  Therapist Response:Pt. Continues topresentas talkative, engaged in the therapeutic process. Pt. Discussed ongoing challenge of planning to move. Pt. Reports that this continues to be stressful for her. Pt. Reported that the person hired to clean her boyfriend's home started cleaning and she was happy about that. Pt. Discussed that she wrote a second letter to the apartment management requesting to stay but had not been given a response. Pt. Discussed that neighbors told her that they heard that she would be moving and she was angered by this because she believes that management shared this information that she considered private. Pt. Discussed that the most anxiety causing issue was waiting to hear from the manager. Counselor suggested that Pt. Stop waiting to hear from the manager as she has already been given notice to move and counsel from attorney that there was nothing that she can do. Counselor told Pt. That she did not have to wait for the manager to give her certainty, that this was something that she could give to herself by moving forward with her plan to move. Pt. Connected with this thought and stated that she would begin the process of actively moving.  Plan:Pt.To return intwoweeksfor follow up appointment. Continue with CBT based therapy.  Diagnosis:Bipolar 1    [name], [facility] 09/05/2017

## 2017-09-07 ENCOUNTER — Ambulatory Visit (HOSPITAL_COMMUNITY): Payer: Medicare Other | Admitting: Psychiatry

## 2017-09-13 ENCOUNTER — Other Ambulatory Visit (HOSPITAL_COMMUNITY): Payer: Self-pay | Admitting: Psychiatry

## 2017-09-13 ENCOUNTER — Ambulatory Visit: Payer: Self-pay | Admitting: Cardiology

## 2017-09-13 ENCOUNTER — Ambulatory Visit (HOSPITAL_COMMUNITY): Payer: Medicare Other | Admitting: Psychiatry

## 2017-09-13 DIAGNOSIS — F3130 Bipolar disorder, current episode depressed, mild or moderate severity, unspecified: Secondary | ICD-10-CM

## 2017-09-14 ENCOUNTER — Encounter: Payer: Self-pay | Admitting: Cardiology

## 2017-09-15 ENCOUNTER — Ambulatory Visit (HOSPITAL_COMMUNITY): Payer: Medicare Other | Admitting: Psychiatry

## 2017-09-20 ENCOUNTER — Ambulatory Visit (INDEPENDENT_AMBULATORY_CARE_PROVIDER_SITE_OTHER): Payer: Medicare Other | Admitting: Psychiatry

## 2017-09-20 DIAGNOSIS — F3130 Bipolar disorder, current episode depressed, mild or moderate severity, unspecified: Secondary | ICD-10-CM

## 2017-09-20 NOTE — Progress Notes (Signed)
   THERAPIST PROGRESS NOTE  Session Time:1:40-2:10  Participation Level:Active  Behavioral Response:CasualAlertBrightenedAffectAppropriatelyTearful  Type of Therapy: Individual Therapy  Treatment Goals addressed: [place]; emotional regulation; problem solving  Interventions: solution focused  Summary: Jeannine Kitten a 71y.o.femalewho presents withbipolar disorder  Suicidal/Homicidal:No without intent/plan  Therapist Response:Pt. Continues topresentas talkative, engaged in the therapeutic process.Pt. Discussed that she has continued to move forward with plans to move. Pt. Called her church and requested assistance for moving and several people came to her home to help her pack and 3 men will be coming to help her move next week. Pt. Discussed that she used assertiveness skills learned in the IOP program to motivate her to call and ask for the help that she needed. Pt. Discussed that she had her cat put to sleep and has been grieving the loss of her cat. Pt. Discussed plans to move into her home with her boyfriend and then begin process of looking for her own apartment.  Plan:Pt.To return intwoweeksfor follow up appointment. Continue with CBT based therapy.  Diagnosis:Bipolar 1     Nancie Neas, [place] 09/20/2017

## 2017-10-04 ENCOUNTER — Ambulatory Visit (HOSPITAL_COMMUNITY)
Admission: RE | Admit: 2017-10-04 | Discharge: 2017-10-04 | Disposition: A | Discharge status: Home / Self Care | Payer: Medicare Other | Attending: Psychiatry | Admitting: Psychiatry

## 2017-10-04 DIAGNOSIS — Z886 Allergy status to analgesic agent status: Secondary | ICD-10-CM | POA: Diagnosis not present

## 2017-10-04 DIAGNOSIS — F319 Bipolar disorder, unspecified: Secondary | ICD-10-CM | POA: Diagnosis not present

## 2017-10-04 DIAGNOSIS — Z808 Family history of malignant neoplasm of other organs or systems: Secondary | ICD-10-CM | POA: Insufficient documentation

## 2017-10-04 DIAGNOSIS — Z883 Allergy status to other anti-infective agents status: Secondary | ICD-10-CM | POA: Diagnosis not present

## 2017-10-04 DIAGNOSIS — Z884 Allergy status to anesthetic agent status: Secondary | ICD-10-CM | POA: Insufficient documentation

## 2017-10-04 DIAGNOSIS — Z9889 Other specified postprocedural states: Secondary | ICD-10-CM | POA: Diagnosis not present

## 2017-10-04 DIAGNOSIS — Z8489 Family history of other specified conditions: Secondary | ICD-10-CM | POA: Insufficient documentation

## 2017-10-04 DIAGNOSIS — Z809 Family history of malignant neoplasm, unspecified: Secondary | ICD-10-CM | POA: Insufficient documentation

## 2017-10-04 DIAGNOSIS — Z811 Family history of alcohol abuse and dependence: Secondary | ICD-10-CM | POA: Insufficient documentation

## 2017-10-04 DIAGNOSIS — Z885 Allergy status to narcotic agent status: Secondary | ICD-10-CM | POA: Diagnosis not present

## 2017-10-04 DIAGNOSIS — Z888 Allergy status to other drugs, medicaments and biological substances status: Secondary | ICD-10-CM | POA: Diagnosis not present

## 2017-10-04 DIAGNOSIS — Z800 Family history of malignant neoplasm of digestive organs: Secondary | ICD-10-CM | POA: Insufficient documentation

## 2017-10-04 DIAGNOSIS — I10 Essential (primary) hypertension: Secondary | ICD-10-CM | POA: Insufficient documentation

## 2017-10-04 DIAGNOSIS — Z818 Family history of other mental and behavioral disorders: Secondary | ICD-10-CM | POA: Diagnosis not present

## 2017-10-04 NOTE — H&P (Signed)
Behavioral Health Medical Screening Exam  [name] is an 72 y.o. female presents as walk in at [facility].  Patient states that she has not been taking her medication.  States that she is feeling overwhelmed because she has to move but has no one to help.  States she has called the church but they have not called her back.  Patient states that she has been "loud and angry"  Patient reports that she has services at [facility] but her psychiatrist has been out of town; states that she wants to restart her medication but doesn't know what dose to restart back on.  Patient also states that she has done IOP before and it helped.  Patient denies suicidal/self-harm/homicidal ideation, psychosis, and paranoia.    Total Time spent with patient: 30 minutes  Psychiatric Specialty Exam: Physical Exam  Constitutional: She is oriented to person, place, and time. She appears well-nourished.  Neck: Normal range of motion.  Respiratory: Effort normal.  Musculoskeletal: Normal range of motion.  Neurological: She is alert and oriented to person, place, and time.  Skin: Skin is warm and dry.  Psychiatric: Judgment normal. Her mood appears anxious. She is agitated. Paranoid: Denies. Cognition and memory are normal. Homicidal: Denies. Suicidal: Denies.    Review of Systems  Psychiatric/Behavioral: Depression: Denies. Hallucinations: Denies. Substance abuse: Denies. Suicidal ideas: Denies. The patient is nervous/anxious. Insomnia: Denies.        Patient states that she has been stressed out because she is trying to move and can't find anyone to help her and it has caused her to feel overwhelmed  All other systems reviewed and are negative.   Blood pressure 111/72, pulse 66, temperature 98 F (36.7 C), resp. rate 18, SpO2 100 %.There is no height or weight on file to calculate BMI.  General Appearance: Casual  Eye Contact:  Good  Speech:  Clear and Coherent and Normal Rate  Volume:  Normal  Mood:  Anxious  and Irritable  Affect:  Congruent  Thought Process:  Coherent  Orientation:  Full (Time, Place, and Person)  Thought Content:  Logical  Suicidal Thoughts:  No  Homicidal Thoughts:  No  Memory:  Immediate;   Good Recent;   Good Remote;   Good  Judgement:  Intact  Insight:  Present  Psychomotor Activity:  Normal  Concentration: Concentration: Fair and Attention Span: Fair  Recall:  Good  Fund of Knowledge:Fair  Language: Good  Akathisia:  No  Handed:  Right  AIMS (if indicated):     Assets:  Communication Skills Desire for Improvement Housing Social Support  Sleep:       Musculoskeletal: Strength & Muscle Tone: within normal limits Gait & Station: normal Patient leans: N/A  Blood pressure 111/72, pulse 66, temperature 98 F (36.7 C), resp. rate 18, SpO2 100 %.  Recommendations:  Referral to [facility] IOP program.  Disposition: No evidence of imminent risk to self or others at present.   Patient does not meet criteria for psychiatric inpatient admission. Supportive therapy provided about ongoing stressors. Refer to IOP. Discussed crisis plan, support from social network, calling 911, coming to the Emergency Department, and calling Suicide Hotline.  Based on my evaluation the patient does not appear to have an emergency medical condition.  Ambrose Wile, NP 10/04/2017, 5:52 PM
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
## 2017-10-05 ENCOUNTER — Telehealth (HOSPITAL_COMMUNITY): Payer: Self-pay | Admitting: Psychiatry

## 2017-10-05 ENCOUNTER — Telehealth (HOSPITAL_COMMUNITY): Payer: Self-pay | Admitting: Professional

## 2017-10-06 ENCOUNTER — Telehealth (HOSPITAL_COMMUNITY): Payer: Self-pay | Admitting: Psychiatry

## 2017-10-06 ENCOUNTER — Other Ambulatory Visit (HOSPITAL_COMMUNITY): Payer: Medicare Other | Attending: Psychiatry | Admitting: Psychiatry

## 2017-10-06 ENCOUNTER — Ambulatory Visit (HOSPITAL_COMMUNITY): Payer: Medicare Other | Admitting: Psychiatry

## 2017-10-06 ENCOUNTER — Encounter (HOSPITAL_COMMUNITY): Payer: Self-pay | Admitting: Psychiatry
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
## 2017-10-06 NOTE — Progress Notes (Signed)
    Daily Group Progress Note  Program: IOP  Group Time: 0900-1200  Participation Level: Active  Behavioral Response: Appropriate and Sharing  Type of Therapy:  Process Group  Summary of Progress: Today was patient's first day back in [place].  Patient processed her losses.  Recent loss of a beloved cat that she had put to sleep due to illness.  According to pt, she is currently packing up her apartment.  Reports conflict with management.  "I will be moving in with my fiance' for one month then I will be homeless unless Section 8 finds me something." Pt also reports hip pain; in which she got up to walk around a few times due to the pain.  After the first group, pt appeared very drowsy.  States she couldn't keep her eyes open and requested to go to her car for a quick 10 minute nap.  Later found patient outside in the hallway on a bench asleep.  Pt was awakened and walked around before driving home.      Carlis Abbott, RITA, M.Ed,CNA

## 2017-10-07 ENCOUNTER — Encounter (HOSPITAL_COMMUNITY): Payer: Self-pay | Admitting: Family

## 2017-10-07 ENCOUNTER — Other Ambulatory Visit (HOSPITAL_COMMUNITY): Payer: Medicare Other | Admitting: Professional

## 2017-10-07 DIAGNOSIS — F331 Major depressive disorder, recurrent, moderate: Secondary | ICD-10-CM

## 2017-10-07 DIAGNOSIS — F3130 Bipolar disorder, current episode depressed, mild or moderate severity, unspecified: Secondary | ICD-10-CM

## 2017-10-07 MED ORDER — DIVALPROEX SODIUM ER 250 MG PO TB24: 250.0000 mg | ORAL_TABLET | Freq: Every day | ORAL | 0 refills | Status: DC

## 2017-10-07 MED ORDER — TRAZODONE HCL 50 MG PO TABS: 50.0000 mg | ORAL_TABLET | Freq: Every day | ORAL | 0 refills | Status: DC
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
## 2017-10-08 ENCOUNTER — Other Ambulatory Visit (HOSPITAL_COMMUNITY): Payer: Self-pay

## 2017-10-08 DIAGNOSIS — Z79899 Other long term (current) drug therapy: Secondary | ICD-10-CM

## 2017-10-10 ENCOUNTER — Other Ambulatory Visit (HOSPITAL_COMMUNITY): Payer: Self-pay

## 2017-10-10 ENCOUNTER — Other Ambulatory Visit (HOSPITAL_COMMUNITY): Payer: Medicare Other

## 2017-10-10 DIAGNOSIS — F3130 Bipolar disorder, current episode depressed, mild or moderate severity, unspecified: Secondary | ICD-10-CM

## 2017-10-10 LAB — TSH: TSH: 1.7 u[IU]/mL (ref 0.450–4.500)

## 2017-10-10 LAB — SPECIMEN STATUS REPORT

## 2017-10-10 MED ORDER — DIVALPROEX SODIUM ER 250 MG PO TB24: 250.0000 mg | ORAL_TABLET | Freq: Every day | ORAL | 0 refills | Status: DC

## 2017-10-11 ENCOUNTER — Ambulatory Visit (INDEPENDENT_AMBULATORY_CARE_PROVIDER_SITE_OTHER): Payer: Medicare Other | Admitting: Psychiatry

## 2017-10-11 ENCOUNTER — Other Ambulatory Visit (HOSPITAL_COMMUNITY): Payer: Medicare Other

## 2017-10-11 DIAGNOSIS — F3130 Bipolar disorder, current episode depressed, mild or moderate severity, unspecified: Secondary | ICD-10-CM | POA: Diagnosis not present

## 2017-10-11 LAB — VITAMIN D 1,25 DIHYDROXY

## 2017-10-11 LAB — VITAMIN D 25 HYDROXY (VIT D DEFICIENCY, FRACTURES): Vit D, 25-Hydroxy: 70.2 ng/mL (ref 30.0–100.0)

## 2017-10-11 LAB — SPECIMEN STATUS REPORT

## 2017-10-11 LAB — VALPROIC ACID LEVEL: Valproic Acid Lvl: 37 ug/mL — ABNORMAL LOW (ref 50–100)

## 2017-10-11 NOTE — Progress Notes (Signed)
Daily Group Progress Note   Program: IOP   Group Time: 9:00-12:00   Participation Level: Active   Behavioral Response: Appropriate   Type of Therapy:  Group Therapy  Summary of Progress: Pt. Presented with depressed affect, talkative, engaged in the group process. Pt. discussed issues with nosey neighbors and being assertive, not aggressive to address these issues. Pt participated in values and goals discussion. Group discussed SMART goals and how to break big goals into manageable parts. Pt identified goal of moving.  Pt spent time breaking goal down using SMART goals. Pt set goal of having 2 boxes packed by the end of the weekend. Pt reports this aligns with her value of living in a comfortable, safe environment.   Sena Clouatre, LPCA, LCASA

## 2017-10-12 ENCOUNTER — Encounter (HOSPITAL_COMMUNITY): Payer: Self-pay | Admitting: Psychiatry

## 2017-10-12 ENCOUNTER — Other Ambulatory Visit (HOSPITAL_COMMUNITY): Payer: Medicare Other | Admitting: Psychiatry
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
## 2017-10-12 NOTE — Progress Notes (Unsigned)
[name] is a 72 y.o. ,divorced, Caucasian female, who was referred by TTS; treatment for worsening depressive and anxiety symptoms with passive SI.  Pt denies a plan or intent.  Able to contract for safety.  Pt was referred to TTS by her PCP for evaluation because she had presented to PCP's office distraught.  Patient states she's been off her medications for six months.  Pt decided to stop the meds after she had a genetic test done at [facility].  Pt is well known to this Probation officer d/t previous admits.   CC: previous MH-IOP chart/notes for history. Stressors: 1) Conflictual relationship with fiance'. According to pt, they were reunited ~ threeysr ago. "We dated yrs ago." He is dx with Schizo-affective D/O. "He is difficult to deal with at times." Pt states he was hospitalized several times two yrs ago.  Pt reports he inherited money from a trust fund. Pt is upset with him because she is about to be evicted from her apartment and can only live with him for one month or her [address] 8 will cease.  "I feel he should buy me a place because he can afford it."  Pt states she has been having conflict with her landlord.   2) States she had to have her beloved cat euthanized recently.  3) Chronic Pain:  Pt states she has been having hip pain for ~ 4 months.   Family hx: Father (ETOH). Pt last completed MH-IOP on 01-17-17.  Patient only attended two days in [place]; came to appt and met with her therapist Brooke Levels, PhD) yesterday.  It was decided that pt would follow up with PACE Program instead of attending MH-IOP.  A:  Discharge today.  Referred pt PACE by patient's therapist.  Encouraged support groups at [facility].  Will f/u with Brooke Levels, PhD.  Writer will inquire if Dr. Doyne Frank would be willing to see patient at least one more time before she transfers to the [facility] program; if not patient will be provided with a psychiatrist referral.  R:  Pt receptive.       [name],  [name], M.Ed,CNA

## 2017-10-13 ENCOUNTER — Telehealth (HOSPITAL_COMMUNITY): Payer: Self-pay | Admitting: Psychiatry

## 2017-10-13 ENCOUNTER — Other Ambulatory Visit (HOSPITAL_COMMUNITY): Payer: Medicare Other

## 2017-10-13 NOTE — Telephone Encounter (Signed)
D:  Patient was discharged from [facility] due to referral to [facility]; which was facilitated by pt's therapist Eloise Levels, PhD).  Placed call to pt to discuss aftercare plan.  Writer had spoken to Dr. Doyne Keel to inform her of pt's aftercare plan.  Informed Dr. Doyne Keel that referral wasn't made until yesterday and it is uncertain when pt would be assessed for their services.  Inquired if Dr. Doyne Keel would be willing to see patient at least one more time before transferring services to [facility].  Dr. Doyne Keel agreed to see patient at least one more time.   Upon talking to pt, she was very tearful and distraught.  "I have been packing up my place.  I've been evicted.  I have no help.  I am to go to court next week due to this eviction." Pt admits to passive SI.  Denies a plan, means or intent.  Discussed safety options with pt.  Pt agreed to go to her nearest ED or [facility] if she can't contract for safety any longer.  A:  Appt with Dr. Doyne Keel is 11-10-17 @ 2 pm.  F/U with Eloise Levels, PhD on 10-18-17 and Dr. Doyne Keel 11-10-17 (encouraged pt to call weekly to see if there's a cancellation).  Encouraged support groups.  Informed Dr. Doyne Keel.  R:  Pt receptive.

## 2017-10-13 NOTE — Progress Notes (Signed)
   THERAPIST PROGRESS NOTE  Session Time:2:10-3:00  Participation Level:Active  Behavioral Response:CasualAlertBrightenedAffectAppropriatelyTearful  Type of Therapy: Individual Therapy  Treatment Goals addressed: [place]; emotional regulation; problem solving  Interventions: solution focused  Summary: [name] a 71y.o.femalewho presents withbipolar disorder1  Suicidal/Homicidal:No without intent/plan  Therapist Response:Pt. Continues topresentas talkative, engaged in the therapeutic process.Pt. Reported that she did not think that the [facility] program was a good fit for her because she perceived that the other patients were in a better place emotionally. Pt. Discussed that she continues to work on moving from her apartment and moving in temporarily with her boyfriend. Pt. Discussed that she feels very overwhelmed by moving and is not sure how she will do it or find another Section 8 approved apartment within her time limites. Pt. Discussed that she has significant body pain that has not been successfully treated despite being seen by orthodpedics and rheumatology. Pt. Discussed returning to her primary care for further referrals regarding pain management. Pt. Discussed history of taking herself off of her psychiatric medication and pattern of eventually feeling more depressed. Pt. Discussed her current prescription for depacote which she was able to get filled, but reports that she did not think the dose was helping her. Therapist discussed the [facility]ACE program, gave Pt. Information about the program, and completed referral during the session. Pt. Indicated that PACE sounded like a good fit with her and would follow-through with the assessment process.  Plan:Pt.was referred to the PACE program; Pt. To follow up with assessment for the program.  Diagnosis:Bipolar 1      [name], [facility] 10/13/2017

## 2017-10-14 ENCOUNTER — Other Ambulatory Visit: Payer: Self-pay

## 2017-10-14 ENCOUNTER — Emergency Department (HOSPITAL_COMMUNITY): Payer: Medicare Other

## 2017-10-14 ENCOUNTER — Encounter (HOSPITAL_COMMUNITY): Payer: Self-pay

## 2017-10-14 ENCOUNTER — Other Ambulatory Visit (HOSPITAL_COMMUNITY): Payer: Medicare Other

## 2017-10-14 ENCOUNTER — Emergency Department (HOSPITAL_COMMUNITY)
Admission: EM | Admit: 2017-10-14 | Discharge: 2017-10-15 | Disposition: A | Discharge status: Home / Self Care | Payer: Medicare Other | Attending: Emergency Medicine | Admitting: Emergency Medicine

## 2017-10-14 DIAGNOSIS — F909 Attention-deficit hyperactivity disorder, unspecified type: Secondary | ICD-10-CM | POA: Insufficient documentation

## 2017-10-14 DIAGNOSIS — I5030 Unspecified diastolic (congestive) heart failure: Secondary | ICD-10-CM | POA: Insufficient documentation

## 2017-10-14 DIAGNOSIS — R45851 Suicidal ideations: Secondary | ICD-10-CM | POA: Insufficient documentation

## 2017-10-14 DIAGNOSIS — I110 Hypertensive heart disease with heart failure: Secondary | ICD-10-CM | POA: Diagnosis not present

## 2017-10-14 DIAGNOSIS — F39 Unspecified mood [affective] disorder: Secondary | ICD-10-CM

## 2017-10-14 DIAGNOSIS — Z046 Encounter for general psychiatric examination, requested by authority: Secondary | ICD-10-CM | POA: Insufficient documentation

## 2017-10-14 DIAGNOSIS — I2510 Atherosclerotic heart disease of native coronary artery without angina pectoris: Secondary | ICD-10-CM | POA: Diagnosis not present

## 2017-10-14 DIAGNOSIS — E039 Hypothyroidism, unspecified: Secondary | ICD-10-CM | POA: Insufficient documentation

## 2017-10-14 DIAGNOSIS — F329 Major depressive disorder, single episode, unspecified: Secondary | ICD-10-CM | POA: Insufficient documentation

## 2017-10-14 DIAGNOSIS — Z79899 Other long term (current) drug therapy: Secondary | ICD-10-CM | POA: Diagnosis not present

## 2017-10-14 DIAGNOSIS — Z7982 Long term (current) use of aspirin: Secondary | ICD-10-CM | POA: Insufficient documentation

## 2017-10-14 LAB — CBC
HCT: 35.6 % — ABNORMAL LOW (ref 36.0–46.0)
Hemoglobin: 11.5 g/dL — ABNORMAL LOW (ref 12.0–15.0)
MCH: 28 pg (ref 26.0–34.0)
MCHC: 32.3 g/dL (ref 30.0–36.0)
MCV: 86.8 fL (ref 78.0–100.0)
Platelets: 231 10*3/uL (ref 150–400)
RBC: 4.1 MIL/uL (ref 3.87–5.11)
RDW: 14.2 % (ref 11.5–15.5)
WBC: 4.7 10*3/uL (ref 4.0–10.5)

## 2017-10-14 LAB — COMPREHENSIVE METABOLIC PANEL
ALT: 15 U/L (ref 0–44)
AST: 20 U/L (ref 15–41)
Albumin: 3.6 g/dL (ref 3.5–5.0)
Alkaline Phosphatase: 64 U/L (ref 38–126)
Anion gap: 5 (ref 5–15)
BUN: 21 mg/dL (ref 8–23)
CO2: 31 mmol/L (ref 22–32)
Calcium: 9.5 mg/dL (ref 8.9–10.3)
Chloride: 107 mmol/L (ref 98–111)
Creatinine, Ser: 0.87 mg/dL (ref 0.44–1.00)
GFR calc Af Amer: >60 mL/min (ref >60)
GFR calc non Af Amer: >60 mL/min (ref >60)
Glucose, Bld: 99 mg/dL (ref 70–99)
Potassium: 3.9 mmol/L (ref 3.5–5.1)
Sodium: 143 mmol/L (ref 135–145)
Total Bilirubin: 0.7 mg/dL (ref 0.3–1.2)
Total Protein: 6.9 g/dL (ref 6.5–8.1)

## 2017-10-14 LAB — URINALYSIS, ROUTINE W REFLEX MICROSCOPIC
Bilirubin Urine: NEGATIVE
Glucose, UA: NEGATIVE mg/dL
Hgb urine dipstick: NEGATIVE
Ketones, ur: NEGATIVE mg/dL
Leukocytes, UA: NEGATIVE
Nitrite: NEGATIVE
Protein, ur: NEGATIVE mg/dL
Specific Gravity, Urine: 1.025 (ref 1.005–1.030)
pH: 5 (ref 5.0–8.0)

## 2017-10-14 LAB — SALICYLATE LEVEL: Salicylate Lvl: <7 mg/dL (ref 2.8–30.0)

## 2017-10-14 LAB — RAPID URINE DRUG SCREEN, HOSP PERFORMED
Amphetamines: NOT DETECTED
Barbiturates: NOT DETECTED
Benzodiazepines: POSITIVE — AB
Cocaine: NOT DETECTED
Opiates: NOT DETECTED
Tetrahydrocannabinol: NOT DETECTED

## 2017-10-14 LAB — ETHANOL: Alcohol, Ethyl (B): <10 mg/dL (ref <10)

## 2017-10-14 LAB — ACETAMINOPHEN LEVEL: Acetaminophen (Tylenol), Serum: 10 ug/mL (ref 10–30)

## 2017-10-14 MED ORDER — ACETAMINOPHEN 325 MG PO TABS
650.0000 mg | ORAL_TABLET | Freq: Once | ORAL | Status: AC
  Administered 2017-10-14: 650 mg via ORAL
  Filled 2017-10-14 (×2): qty 2

## 2017-10-14 MED ORDER — LOSARTAN POTASSIUM-HCTZ 100-25 MG PO TABS: 1.0000 | ORAL_TABLET | Freq: Every day | ORAL | Status: DC

## 2017-10-14 MED ORDER — TRAZODONE HCL 50 MG PO TABS: 50.0000 mg | ORAL_TABLET | Freq: Every day | ORAL | Status: DC

## 2017-10-14 MED ORDER — VITAMIN D3 25 MCG (1000 UNIT) PO TABS
2000.0000 [IU] | ORAL_TABLET | Freq: Every day | ORAL | Status: DC
  Administered 2017-10-14: 2000 [IU] via ORAL
  Filled 2017-10-14 (×2): qty 2

## 2017-10-14 MED ORDER — LEVOTHYROXINE SODIUM 88 MCG PO TABS
88.0000 ug | ORAL_TABLET | Freq: Every day | ORAL | Status: DC
  Filled 2017-10-14: qty 1

## 2017-10-14 MED ORDER — LOSARTAN POTASSIUM 50 MG PO TABS
100.0000 mg | ORAL_TABLET | Freq: Every day | ORAL | Status: DC
  Administered 2017-10-14: 100 mg via ORAL
  Filled 2017-10-14 (×2): qty 2

## 2017-10-14 MED ORDER — DIMENHYDRINATE 50 MG PO TABS
50.0000 mg | ORAL_TABLET | Freq: Three times a day (TID) | ORAL | Status: DC | PRN
  Filled 2017-10-14: qty 1

## 2017-10-14 MED ORDER — HYDROCHLOROTHIAZIDE 25 MG PO TABS
25.0000 mg | ORAL_TABLET | Freq: Every day | ORAL | Status: DC
  Administered 2017-10-14: 25 mg via ORAL
  Filled 2017-10-14 (×2): qty 1

## 2017-10-14 MED ORDER — NEBIVOLOL HCL 2.5 MG PO TABS: 2.5000 mg | ORAL_TABLET | Freq: Every day | ORAL | Status: DC

## 2017-10-14 MED ORDER — LORAZEPAM 1 MG PO TABS
1.0000 mg | ORAL_TABLET | Freq: Two times a day (BID) | ORAL | Status: DC | PRN
  Administered 2017-10-14 (×2): 1 mg via ORAL
  Filled 2017-10-14 (×2): qty 1

## 2017-10-14 MED ORDER — DIVALPROEX SODIUM ER 250 MG PO TB24
250.0000 mg | ORAL_TABLET | Freq: Every day | ORAL | Status: DC
  Administered 2017-10-14: 250 mg via ORAL
  Filled 2017-10-14 (×2): qty 1

## 2017-10-14 MED ORDER — TRAZODONE HCL 100 MG PO TABS: 100.0000 mg | ORAL_TABLET | Freq: Every day | ORAL | Status: DC

## 2017-10-14 MED ORDER — DIPHENHYDRAMINE HCL 25 MG PO TABS: 25.0000 mg | ORAL_TABLET | Freq: Two times a day (BID) | ORAL | Status: DC | PRN

## 2017-10-14 MED ORDER — HYDROXYZINE HCL 25 MG PO TABS: 25.0000 mg | ORAL_TABLET | Freq: Three times a day (TID) | ORAL | Status: DC | PRN

## 2017-10-14 MED ORDER — MAGNESIUM GLUCONATE 500 MG PO TABS
500.0000 mg | ORAL_TABLET | Freq: Every day | ORAL | Status: DC
  Administered 2017-10-14: 500 mg via ORAL
  Filled 2017-10-14 (×2): qty 1

## 2017-10-14 NOTE — ED Notes (Signed)
Pts clothing and jewelry was taken and placed into her locker #27. Pt had a green top white pants and red flat shoes along with three rings and a red purse. Allowed pt do get phone numbers from cell phone in order to use our phone in tcu.

## 2017-10-14 NOTE — BH Assessment (Signed)
[facility] Assessment Progress Note  Case was staffed with Romilda Garret FNP who recommended a inpatient admission to assist with stabilization as appropriate placement is investigated.

## 2017-10-14 NOTE — ED Provider Notes (Signed)
[facility] DEPT Provider Note   CSN: [id number] Arrival date & time: 10/14/17  1032     History   Chief Complaint Chief Complaint  Patient presents with  . Medical Clearance  . Suicidal    HPI [name] is a 72 y.o. female.  The history is provided by the patient. No language interpreter was used.   [name] is a 72 y.o. female who presents to the Emergency Department complaining of SI. She presents to the emergency department complaining of psychiatric illness. She states that she is having severe anxiety and depression. She is feeling hopeless at home. She does not have a suicide plan but states that she does not want to live like this anymore. She denies any fevers, chest pain, shortness of breath, abdominal pain, nausea, vomiting. No hematochezia or melena. She lives at home alone and presents voluntarily for evaluation. She denies any tobacco or alcohol use. Past Medical History:  Diagnosis Date  . Abscess of Bartholin's gland   . Acute vestibular neuronitis    Dr Lucia Gaskins  . ADHD (attention deficit hyperactivity disorder)   . Adverse effect of general anesthetic    felt paralyzed while receiving anesthesia  . Anxiety   . Bipolar 1 disorder ([place])   . CAD (coronary artery disease), native coronary artery    cath 05/2016 showing 50-70% stenosis in the mid LAD proximal to the first diagonal and 70-80% small OM1. FFR of LAD  not performed because of difficulty with catheter control from the right radial.   . Chronic kidney disease    kidney cancer- pt states she has elected to not have it treated.  . Depression    Bipolar disorder/goes to [facility] for meds  . Difficult intubation    told by [employer] that she was hard to intubate 15b yrs ago in [place]- surgery since then no problems  . GERD (gastroesophageal reflux disease)   . H/O echocardiogram 07/2012   Normla LVF w grade I siastolic dysfunction   . History of attention deficit  disorder   . History of endometriosis   . History of pleural effusion   . Hyperlipidemia LDL goal <70 06/24/2016  . Hypertension   . 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 Memory difficulty 01/26/2017  . Migraines   . Obesity   . OSA (obstructive sleep apnea)    uses oral appliance instead of CPAP  . PAT (paroxysmal atrial tachycardia) (HCC)    s/p ablation  . PONV (postoperative nausea and vomiting)   . PTSD (post-traumatic stress disorder)   . PVC's (premature ventricular contractions)   . Renal mass 02/15/2012  . RLS (restless legs syndrome)    Dr Gwenette Greet  . rt renal ca dx'd 12/2009   no treatment/ no surg  . Vertigo     Patient Active Problem List   Diagnosis Date Noted  . RLS (restless legs syndrome)   . PONV (postoperative nausea and vomiting)   . Migraines   . History of pleural effusion   . History of attention deficit disorder   . History of endometriosis   . GERD (gastroesophageal reflux disease)   . Depression   . Chronic kidney disease   . CAD (coronary artery disease)   . Bipolar 1 disorder ([place])   . Anxiety   . Adverse effect of general anesthetic   . Acute vestibular neuronitis   . Abscess of Bartholin's gland   . ADHD (attention  deficit hyperactivity disorder)   . Memory difficulty 01/26/2017  . Vertigo 01/26/2017  . Hyperlipidemia LDL goal <70 06/24/2016  . GAD (generalized anxiety disorder) 06/03/2015  . PTSD (post-traumatic stress disorder) 01/01/2014  . Bipolar I disorder, most recent episode depressed ([place]) 01/01/2014  . Major depressive disorder, recurrent severe without psychotic features ([place]) 09/25/2013  . PVC's (premature ventricular contractions) 05/31/2013  . Diastolic dysfunction, left ventricle 05/31/2013  . PAT (paroxysmal atrial tachycardia) ([place]) 05/31/2013  . Essential hypertension, benign 05/31/2013  . Obesity 05/31/2013  . Recurrent major depression-severe ([place]) 04/27/2013  . H/O echocardiogram  07/20/2012  . Generalized anxiety disorder 06/30/2012  . Renal mass 02/15/2012  . GALLSTONES 12/31/2009  . ATTENTION DEFICIT DISORDER 02/20/2007  . Obstructive sleep apnea 02/20/2007  . RESTLESS LEGS SYNDROME 02/20/2007  . Hypothyroidism 03/22/1988    Past Surgical History:  Procedure Laterality Date  . CARDIAC ELECTROPHYSIOLOGY [place] AND ABLATION  2000s  . LEFT HEART CATH AND CORONARY ANGIOGRAPHY N/A 06/08/2016   Procedure: Left Heart Cath and Coronary Angiography;  Surgeon: Belva Crome, MD;  Location: [facility];  Service: Cardiovascular;  Laterality: N/A;  . nasoseptal reconstruction  1990s  . THYROIDECTOMY  1990  . TONSILLECTOMY     as a child  . TUBAL LIGATION  1970s  . uterine mass removal  03/2012   was found to be benign     OB History   None      Home Medications    Prior to Admission medications   Medication Sig Start Date End Date Taking? Authorizing Provider  acetaminophen (TYLENOL) 500 MG tablet Take 500 mg by mouth every 6 (six) hours as needed (joint pain).    Yes [provider]  acetaminophen (TYLENOL) 650 MG CR tablet Take 650 mg by mouth every 8 (eight) hours as needed for pain.   Yes [provider]  aspirin 81 MG EC tablet Take 1 tablet (81 mg total) by mouth daily. For heart health 05/08/13  Yes Nwoko, Herbert Pun I, NP  BYSTOLIC 2.5 MG tablet TAKE 1 TABLET BY MOUTH EVERY DAY 08/25/17  Yes Turner, Eber Hong, MD  cholecalciferol (VITAMIN D) 1000 units tablet Take 2,000 Units by mouth daily.   Yes [provider]  dimenhyDRINATE (DRAMAMINE) 50 MG tablet Take 50 mg by mouth every 8 (eight) hours as needed for nausea or dizziness.   Yes [provider]  diphenhydrAMINE (BENADRYL) 25 MG tablet Take 25 mg by mouth 2 (two) times daily as needed for allergies.   Yes [provider]  divalproex (DEPAKOTE ER) 250 MG 24 hr tablet Take 1 tablet (250 mg total) by mouth daily. 10/10/17  Yes Derrill Center, NP  levothyroxine  (SYNTHROID, LEVOTHROID) 88 MCG tablet Take 1 tablet (88 mcg total) by mouth daily before breakfast. For low thyroid function 05/08/13  Yes Nwoko, Herbert Pun I, NP  LORazepam (ATIVAN) 1 MG tablet Take 1 tablet (1 mg total) by mouth 2 (two) times daily as needed for anxiety. 06/30/17 06/30/18 Yes [name], MD  losartan-hydrochlorothiazide (HYZAAR) 100-25 MG tablet Take 1 tablet by mouth daily. 10/25/16  Yes [provider]  Magnesium 500 MG TABS Take 500 mg by mouth daily.   Yes [provider]  OVER THE COUNTER MEDICATION CBD Oil-1 dropperfull twice daily   Yes [provider]  rOPINIRole (REQUIP) 0.5 MG tablet TAKE 1 TABLET BY MOUTH IN THE AFTERNOON AND 1 TABLET AT BEDTIME Patient taking differently: Take 1 tablet as needed for sleep  02/07/17  Yes Rigoberto Noel, MD  traZODone (DESYREL) 50 MG tablet Take 1 tablet (50 mg total) by mouth at bedtime. 10/07/17  Yes Derrill Center, NP  DULoxetine (CYMBALTA) 30 MG capsule Take 2 capsules (60 mg total) by mouth daily. Patient not taking: Reported on 10/14/2017 07/26/17   [name], MD    Family History Family History  Problem Relation Age of Onset  . Allergies Father   . Skin cancer Father   . Bipolar disorder Father   . Alcohol abuse Father   . Heart disease Mother   . Depression Mother   . Hypertension Mother   . CAD Mother   . Colon cancer Paternal Grandmother   . OCD Paternal Grandmother   . Allergies Sister        multiple  . Allergies Brother        multiple  . Allergies Daughter   . Heart disease Maternal Grandfather   . Cancer Paternal Grandfather     Social History Social History   Tobacco Use  . Smoking status: Never Smoker  . Smokeless tobacco: Never Used  Substance Use Topics  . Alcohol use: Yes    Alcohol/week: 0.6 oz    Types: 1 Glasses of wine per week  . Drug use: No     Allergies   Geodon [ziprasidone hydrochloride]; Lithium; Talwin [pentazocine]; Toradol [ketorolac tromethamine];  Ziprasidone; Abilify [aripiprazole]; Compazine [prochlorperazine edisylate]; Latuda [lurasidone hcl]; Pristiq [desvenlafaxine succinate er]; Rexulti [brexpiprazole]; and Diflucan [fluconazole]   Review of Systems Review of Systems  All other systems reviewed and are negative.    Physical Exam Updated Vital Signs BP (!) 129/56 (BP Location: Right Arm)   Pulse 65   Temp 97.7 F (36.5 C) (Oral)   Resp 20   Ht 5\' 5"  (1.651 m)   Wt 97.5 kg (215 lb)   SpO2 100%   BMI 35.78 kg/m   Physical Exam  Constitutional: She is oriented to person, place, and time. She appears well-developed and well-nourished.  HENT:  Head: Normocephalic and atraumatic.  Cardiovascular: Normal rate and regular rhythm.  No murmur heard. Pulmonary/Chest: Effort normal and breath sounds normal. No respiratory distress.  Abdominal: Soft. There is no tenderness. There is no rebound and no guarding.  Musculoskeletal: She exhibits no edema or tenderness.  Neurological: She is alert and oriented to person, place, and time.  Skin: Skin is warm and dry.  Psychiatric:  Tearful and anxious  Nursing note and vitals reviewed.    ED Treatments / Results  Labs (all labs ordered are listed, but only abnormal results are displayed) Labs Reviewed  CBC - Abnormal; Notable for the following components:      Result Value   Hemoglobin 11.5 (*)    HCT 35.6 (*)    All other components within normal limits  RAPID URINE DRUG SCREEN, HOSP PERFORMED - Abnormal; Notable for the following components:   Benzodiazepines POSITIVE (*)    All other components within normal limits  COMPREHENSIVE METABOLIC PANEL  ETHANOL  SALICYLATE LEVEL  ACETAMINOPHEN LEVEL    EKG None  Radiology No results found.  Procedures Procedures (including critical care time)  Medications Ordered in ED Medications - No data to display   Initial Impression / Assessment and Plan / ED Course  I have reviewed the triage vital signs and the  nursing notes.  Pertinent labs & imaging results that were available during my care of the patient were reviewed by me and considered in my  medical decision making (see chart for details).     Patient with history of anxiety and depression presents voluntarily for psychiatric evaluation. She is anxious and tearful on examination. She has been medically cleared for psychiatric evaluation and treatment.  Final Clinical Impressions(s) / ED Diagnoses   Final diagnoses:  None    ED Discharge Orders    None       Quintella Reichert, MD 10/14/17 1432
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
## 2017-10-14 NOTE — ED Notes (Signed)
Pt A&O x 3, no distress noted, calm & cooperative, slightly anxious.  Pt watching TV at present.  Monitoring for safety, Q 15 min checks in effect.

## 2017-10-14 NOTE — ED Notes (Signed)
Pt endorses intermittent SI and reports seeing occasional gray shadows. Denies HI. She contracts on unit. She says her outpatient MD urged her to seek inpatient treatment. Pt reports that she has a court date Monday regarding unpaid rent. Pt is pleasant, tearful, and cooperative.

## 2017-10-14 NOTE — ED Notes (Signed)
Pt reading magazine, pt behavior cooperative, pleasant. Denies SI/HI/AVH. Special checks q 15 mins in place for safety, Video monitoring in place. Will continue to monitor.

## 2017-10-14 NOTE — ED Triage Notes (Signed)
Arrives via POV for SI, states hx of depression and bipolar. Has been off meds for "months"

## 2017-10-14 NOTE — ED Notes (Signed)
Pt refused transport to [facility] because, Security would not retrieve valuables out of vehicle, parked in parking lot. Spiral notebooks and clothing on hangers in car.  Pt states there is jewelry in her car also.  Social Worker Roderic Palau Riffey notified that pt refused admission.  He will notify Dr Wilson Singer who is covering.

## 2017-10-14 NOTE — Progress Notes (Addendum)
CSW received a call from [place] Pt has been accepted by: [facility] Number for report is: [phone] Pt's unit/room/bed number will be: 74 Bed 2 Accepting physician: Dr. Jacqulyn Ducking  Pt can arrive by 11pm tonight or after 6am on 7/27  Please call the report number to tell them pt is arriving tonight or tomorrow.  Pt must be agreeable to sign in voluntary.  CSW will update RN and EDP.  Alphonse Guild. [name], LCSW, LCAS, CSI Clinical Social Worker Ph: [phone]

## 2017-10-14 NOTE — Progress Notes (Signed)
CSW met with the EDP and ED AD who stated they would be agreeable with the pt, who is voluntary, to be escorted to her car to retrieve valuables if pt was agreeable in order to assist the pt in D/C'ing to the facility which accepted her [facility]).   Security presented concerns with security safety due to the pt's care appearing as if she lived out of it and due to the fears of possible weapons and also that items that the pt is retrieving would not be able to be screened for pt and staff safety if the pt is retrieving said items.    CSW agreed to ask the pt if the pt still wanted to retrieve items and if so, the EDP can be notified in order to make a decision as to the safety of the pt and staff if so.  CSW met with pt again and pt began making accusations that security and staff were spreading falsehoods, regarding the pt, due to the pt thinking she overheard the conversations between the [place] and security who were in the secure SAPU enclosure.  CSW stated to pt he was unsure of the pt was referring to and the pt stated, "You know what, I just want to go".  CSW asked what pt's wishes are regarding D/C'ing to [facility] and pt stated, "I just want to leave I don't want to go to there anymore".    CSW asked again if pt still wanted to go to the facility at [facility] and pt again, stated, "No, I just want to leave here, I don't want to go there".  CSW stated he understood and updated pt's RN, Security and EPD.  All voiced understanding.  Please reconsult if future social work needs arise.  CSW signing off, as social work intervention is no longer needed.  Alphonse Guild. Nely Dedmon, LCSW, LCAS, CSI Clinical Social Worker Ph: [phone]     '

## 2017-10-14 NOTE — BH Assessment (Signed)
[facility] Assessment Progress Note  Per Ethelene Hal, FNP, this pt requires psychiatric hospitalization at a facility that provides geriatric specialty services this time.  This pt is under voluntary status, and does not meet criteria for IVC.  I spoke to her, reporting these recommendations to pt.  She reports that she does not want to go to a [facility] system facility, especially [facility], but she understands that she would need to be transferred to the first facility that agrees to accept her, to which she agrees.  The following facilities have been contacted to seek placement for this pt, with results as noted:  Beds available, information sent, decision pending:  [place], [place] Coordinator [phone]

## 2017-10-14 NOTE — Progress Notes (Signed)
CSW spoke to the pt and confirmed what the pt's RN had told the pt, which was that the pt had been accepted to [facility] in [place].  Pt was initial;l;y hesitant and CSW reminded the pt that Disposition had counseled the earlier in the day that, "I spoke to her, reporting these recommendations to pt.  She reports that she does not want to go to a [facility] system facility, especially [facility], but she understands that she would need to be transferred to the first facility that agrees to accept her, to which she agrees", per disposition. Pt voiced agreement that this was the case and now states that she is agreeable to go.  RN updated by the CSW.  9:45 PM  CSW received a call from pt's RN that pt had wanted items from her car, such as clothing, jewelry, etc. [employer] went to the car and stated the items inside were not as the pt described and they did not feel comfortable going through the pt's items without the pt being present, whiuch is not possible.  Pt was updated and now refuses to go, without some items from her car.  CSW will update the EDP for a decision.  Of note:  CSW received a call from disposition earlier in the day that, as stated above, the pt had agreed to go to the first facility that accepted her or she would be D/C'd.  CSW related this to the pt who agreed this compromise had been reached with disposition and that she had been agreeable to go to the first facility to accept her.  CSW will continue to follow for D/C needs.  Alphonse Guild. Jahlon Baines, LCSW, LCAS, CSI Clinical Social Worker Ph: [phone]

## 2017-10-14 NOTE — ED Notes (Signed)
Pt talking on hallway phone.  

## 2017-10-14 NOTE — ED Notes (Signed)
Report called to RN Andrew Au, [facility] in [place], [place].  Pending Actor.

## 2017-10-14 NOTE — Progress Notes (Signed)
CSW received a call from Disposition and was told pt is now agreeable to being placed at [facility], [facility], [facility] or [facility] and is voluntary but that if pt refuses to be D/C'd to one of these facilities if accepted, then pt understand she will be D/C'd immediately.   Please reconsult if future social work needs arise.  CSW signing off, as social work intervention is no longer needed.  Alphonse Guild. Cadel Stairs, LCSW, LCAS, CSI Clinical Social Worker Ph: [phone]

## 2017-10-16 ENCOUNTER — Other Ambulatory Visit (HOSPITAL_COMMUNITY): Payer: Self-pay | Admitting: Psychiatry

## 2017-10-16 DIAGNOSIS — F411 Generalized anxiety disorder: Secondary | ICD-10-CM

## 2017-10-17 ENCOUNTER — Other Ambulatory Visit (HOSPITAL_COMMUNITY): Payer: Medicare Other

## 2017-10-18 ENCOUNTER — Other Ambulatory Visit (HOSPITAL_COMMUNITY): Payer: Self-pay

## 2017-10-18 ENCOUNTER — Ambulatory Visit (INDEPENDENT_AMBULATORY_CARE_PROVIDER_SITE_OTHER): Payer: Medicare Other | Admitting: Psychiatry

## 2017-10-18 ENCOUNTER — Other Ambulatory Visit (HOSPITAL_COMMUNITY): Payer: Medicare Other

## 2017-10-18 DIAGNOSIS — F3130 Bipolar disorder, current episode depressed, mild or moderate severity, unspecified: Secondary | ICD-10-CM

## 2017-10-18 DIAGNOSIS — F411 Generalized anxiety disorder: Secondary | ICD-10-CM

## 2017-10-19 ENCOUNTER — Other Ambulatory Visit (HOSPITAL_COMMUNITY): Payer: Medicare Other

## 2017-10-20 ENCOUNTER — Other Ambulatory Visit (HOSPITAL_COMMUNITY): Payer: Medicare Other

## 2017-10-20 ENCOUNTER — Telehealth (HOSPITAL_COMMUNITY): Payer: Self-pay

## 2017-10-20 NOTE — Progress Notes (Signed)
   THERAPIST PROGRESS NOTE  Session Time:2:10-3:00  Participation Level:Active  Behavioral Response:CasualAlertBrightenedAffectAppropriatelyTearful  Type of Therapy: Individual Therapy  Treatment Goals addressed: [place]; emotional regulation; problem solving  Interventions: solution focused  Summary: [name] a 71y.o.femalewho presents withbipolar disorder1  Suicidal/Homicidal:No without intent/plan  Therapist Response:Pt. Continues topresentas talkative, engaged in the therapeutic process.Pt. Reports that she received the follow-up from the PACE program and is scheduled for assessment in the next week. Counselor provided reassurance about the program and encouraged Pt. That this would allow her to receive medical, dental, mental, health, and case management services with less stress than receiving these services is currently causing her. Pt. Reported that she is highly motivated to find housing quickly because she has moved from her apartment and is now living with her boyfriend but it is unacceptable to her because of problems with disorganization and lack of cleanliness in her boyfriend's home. Pt. Discussed that process of moving was extremely stressful for her and she did not know how she would complete the process until volunteers from her church arrived to help her. Pt . Discussed that she is taking her medication as prescribed, but feels that she needs medication for increased anxiety and lack of sleep. Counselor worked to normalize the anxiety response as part of the moving process and adjustment into her boyfriend's apartment. Counselor reviewed use of breathing exercises and meditation to help with coping and calming the stress response.   Plan:Pt.to complete assessment for the PACE program and follow up with counselor in 1 week.  Diagnosis:Bipolar 1    Nancie Neas, [place] 10/20/2017

## 2017-10-21 ENCOUNTER — Other Ambulatory Visit (HOSPITAL_COMMUNITY): Payer: Medicare Other

## 2017-10-24 ENCOUNTER — Other Ambulatory Visit (HOSPITAL_COMMUNITY): Payer: Medicare Other

## 2017-10-25 ENCOUNTER — Other Ambulatory Visit (HOSPITAL_COMMUNITY): Payer: Medicare Other

## 2017-10-26 ENCOUNTER — Other Ambulatory Visit (HOSPITAL_COMMUNITY): Payer: Medicare Other

## 2017-10-27 ENCOUNTER — Other Ambulatory Visit (HOSPITAL_COMMUNITY): Payer: Medicare Other

## 2017-10-27 ENCOUNTER — Other Ambulatory Visit: Payer: Self-pay | Admitting: Family Medicine

## 2017-10-27 DIAGNOSIS — Z1231 Encounter for screening mammogram for malignant neoplasm of breast: Secondary | ICD-10-CM

## 2017-10-28 ENCOUNTER — Other Ambulatory Visit (HOSPITAL_COMMUNITY): Payer: Medicare Other

## 2017-10-31 ENCOUNTER — Other Ambulatory Visit (HOSPITAL_COMMUNITY): Payer: Medicare Other

## 2017-11-01 ENCOUNTER — Ambulatory Visit (INDEPENDENT_AMBULATORY_CARE_PROVIDER_SITE_OTHER): Payer: Medicare Other | Admitting: Psychiatry

## 2017-11-01 ENCOUNTER — Other Ambulatory Visit (HOSPITAL_COMMUNITY): Payer: Medicare Other

## 2017-11-01 DIAGNOSIS — F319 Bipolar disorder, unspecified: Secondary | ICD-10-CM | POA: Diagnosis not present

## 2017-11-02 ENCOUNTER — Other Ambulatory Visit (HOSPITAL_COMMUNITY): Payer: Medicare Other

## 2017-11-03 ENCOUNTER — Ambulatory Visit (INDEPENDENT_AMBULATORY_CARE_PROVIDER_SITE_OTHER): Payer: Medicare Other | Admitting: Psychiatry

## 2017-11-03 ENCOUNTER — Other Ambulatory Visit (HOSPITAL_COMMUNITY): Payer: Medicare Other

## 2017-11-03 DIAGNOSIS — F319 Bipolar disorder, unspecified: Secondary | ICD-10-CM

## 2017-11-04 ENCOUNTER — Other Ambulatory Visit (HOSPITAL_COMMUNITY): Payer: Medicare Other

## 2017-11-04 NOTE — Progress Notes (Signed)
   THERAPIST PROGRESS NOTE   Session Time:1:10-2:00  Participation Level:Active  Behavioral Response:CasualAlertBrightenedAffectAppropriatelyTearful  Type of Therapy: Individual Therapy  Treatment Goals addressed: [place]; emotional regulation; problem solving  Interventions: solution focused  Summary: [name] a 71y.o.femalewho presents withbipolar disorder1  Suicidal/Homicidal:No without intent/plan  Therapist Response:Pt. Presents for second session within one week. Counselor called Pt. For explanation of rationale for the session. Pt. Was very tearful and expressed that she was extremely stressed regarding her living situation and concerns about her boyfriend and wanted her boyfriend to accompany her to the session. Pt. Arrived without her boyfriend, disheveled, tearful, stressed. Pt. Reported that she was not able to convince him to come due to his paranoia. Pt. Reported that she has her [facility] home assessment scheduled for next Tuesday. Pt. Discussed that she has not secured housing,but has two apartments identified that she will call today after the session. Pt. Discussed that her boyfriend's health is deteriorating, she is not able to help take care of him and the condition of his home is becoming difficult for her to manage. Pt. Discussed that when she spends time with him she frequently feels more overwhelmed and depressed. Counselor discussed strategy of moving forward as soon as possible with her plans to move, limiting contact with her boyfriend by leaving the house daily for meals and appointments and limiting her time in the home to sleeping in her bedroom which is the guest bedroom. Session focused on anticipating the end of the relationship and the loss/grief associated with the end and processing the sadness that she feels related to his declining health.   Plan:Pt.to complete assessment for the PACE program and follow up with counselor  in 1 week.  Diagnosis:Bipolar 1   Nancie Neas, [facility] 11/04/2017

## 2017-11-04 NOTE — Progress Notes (Signed)
   THERAPIST PROGRESS NOTE Session Time:1:10-2:00  Participation Level:Active  Behavioral Response:CasualAlertBrightenedAffectAppropriatelyTearful  Type of Therapy: Individual Therapy  Treatment Goals addressed: [place]; emotional regulation; problem solving  Interventions: solution focused  Summary: [name] a 71y.o.femalewho presents withbipolar disorder1  Suicidal/Homicidal:No without intent/plan  Therapist Response:Pt. Continues topresentas talkative, engaged in the therapeutic process, tearful. Pt. Discussed that she is not happy living with her boyfriend. Pt. Has concerns that his mental illness is poorly managed and that he is becoming increasingly paranoid. Pt. Discussed that he has become resentful of her and no longer emotionally or financially supportive in the relationship. Pt. Expressed her fear that she will not be able to find a suitable place to live before her voucher runs out. Counselor reviewed coping behaviors that have been helpful in the past such as deep breathing which patient indicates that she uses regularly. Counselor encouraged Pt. To discuss her plan of contacting at least two potential apartments that accept Section 8 each day so that she does not lose her housing voucher. Pt. Stated that she is still attempting to schedule PACE program in home assessment and is hopeful that it will happen in the next week.  Plan:Pt.to complete assessment for the PACE program and follow up with counselor in 1 week.  Diagnosis:Bipolar 1   [name], [facility] 11/04/2017

## 2017-11-07 ENCOUNTER — Other Ambulatory Visit (HOSPITAL_COMMUNITY): Payer: Medicare Other

## 2017-11-08 ENCOUNTER — Ambulatory Visit (HOSPITAL_COMMUNITY): Payer: Medicare Other | Admitting: Psychiatry

## 2017-11-08 ENCOUNTER — Other Ambulatory Visit (HOSPITAL_COMMUNITY): Payer: Medicare Other

## 2017-11-09 ENCOUNTER — Other Ambulatory Visit (HOSPITAL_COMMUNITY): Payer: Medicare Other

## 2017-11-10 ENCOUNTER — Ambulatory Visit (INDEPENDENT_AMBULATORY_CARE_PROVIDER_SITE_OTHER): Payer: Medicare Other | Admitting: Psychiatry

## 2017-11-10 ENCOUNTER — Encounter (HOSPITAL_COMMUNITY): Payer: Self-pay | Admitting: Psychiatry

## 2017-11-10 ENCOUNTER — Other Ambulatory Visit (HOSPITAL_COMMUNITY): Payer: Medicare Other

## 2017-11-10 VITALS — BP 128/74 | HR 70 | Ht 63.5 in | Wt 210.0 lb

## 2017-11-10 DIAGNOSIS — F4310 Post-traumatic stress disorder, unspecified: Secondary | ICD-10-CM

## 2017-11-10 DIAGNOSIS — F3130 Bipolar disorder, current episode depressed, mild or moderate severity, unspecified: Secondary | ICD-10-CM | POA: Diagnosis not present

## 2017-11-10 DIAGNOSIS — F411 Generalized anxiety disorder: Secondary | ICD-10-CM

## 2017-11-10 MED ORDER — TRAZODONE HCL 50 MG PO TABS: 75.0000 mg | ORAL_TABLET | Freq: Every day | ORAL | 1 refills | Status: DC

## 2017-11-10 MED ORDER — LORAZEPAM 1 MG PO TABS: 1.0000 mg | ORAL_TABLET | Freq: Two times a day (BID) | ORAL | 1 refills | Status: DC | PRN

## 2017-11-10 MED ORDER — DULOXETINE HCL 30 MG PO CPEP: 60.0000 mg | ORAL_CAPSULE | Freq: Every day | ORAL | 1 refills | Status: DC

## 2017-11-10 MED ORDER — DIVALPROEX SODIUM ER 250 MG PO TB24: 250.0000 mg | ORAL_TABLET | Freq: Every day | ORAL | 1 refills | Status: DC

## 2017-11-10 NOTE — Progress Notes (Signed)
[place] MD/PA/NP OP Progress Note  11/10/2017 2:28 PM [name]  MRN:  [id number]  Chief Complaint:  Chief Complaint    Depression; Follow-up     HPI: I last saw the patient in April 2019.  She reports that since then she has been going through a multitude of problems.  She was evicted from her low income housing due to a series of complaints about 1 month ago.  Patient was relieved to be out of their but did not find other housing options.  As a result she moved in with her fianc.  She states that his been extremely stressful as his mental health issues have worsened and she has been physically and mentally taking care of them.  She is also trying to manage the house as well as take care of his personal, legal issues.  She states she feels as though she is walking around on her tiptoes due to him having unexpected, random "altered states".  She denies feeling unsafe but states that she wants to move into her own place as soon as possible.  She has lost her section 8 housing due to the eviction and cannot find any other safe alternatives.  She is exploring options around the area.  She states that through at all her depression has been "okay, not too bad".  She denies SI/HI.  She denies any manic or hypomanic-like symptoms.  Sleep has been on the poor side.  Trazodone 50 mg has been ineffective and she has not increased the dose.  She reports in the past she tried 100 mg and it gave her a hung over-like effect.  She is also tried 75 mg and stated that was more tolerable and effective so she will try it again now.  She states her PTSD symptoms are relatively tolerable.  She has been taking the Ativan 2-3 times a day until this week.  For the last 2 days she has been feeling better "happy" as she does not have to deal with the stress of her fianc since she he is sick in the hospital.  She states that says a lot about their relationship.  Visit Diagnosis:    ICD-10-CM   1. GAD (generalized anxiety  disorder) F41.1 LORazepam (ATIVAN) 1 MG tablet  2. Bipolar I disorder, most recent episode depressed (HCC) F31.30 divalproex (DEPAKOTE ER) 250 MG 24 hr tablet  3. PTSD (post-traumatic stress disorder) F43.10 DULoxetine (CYMBALTA) 30 MG capsule    LORazepam (ATIVAN) 1 MG tablet     Past Psychiatric History:  Anxiety:Yes Bipolar Disorder:Yes Depression:Yes Mania:Yes Psychosis:No Schizophrenia:No Personality Disorder:No Hospitalization for psychiatric illness:Yes History of Electroconvulsive Shock Therapy:No Prior Suicide Attempts:No Previous meds- Rexulti unable to tolerate, Trazodone-unable to tolerate; Pristiq-ineffective, Wellbutrin-ineffective, Lamictal was ok but seemed ineffective; Lithium     Past Medical History:  Past Medical History:  Diagnosis Date  . Abscess of Bartholin's gland   . Acute vestibular neuronitis    Dr Lucia Gaskins  . ADHD (attention deficit hyperactivity disorder)   . Adverse effect of general anesthetic    felt paralyzed while receiving anesthesia  . Anxiety   . Bipolar 1 disorder ([place])   . CAD (coronary artery disease), native coronary artery    cath 05/2016 showing 50-70% stenosis in the mid LAD proximal to the first diagonal and 70-80% small OM1. FFR of LAD  not performed because of difficulty with catheter control from the right radial.   . Chronic kidney disease    kidney cancer- pt states  she has elected to not have it treated.  . Depression    Bipolar disorder/goes to [facility] for meds  . Difficult intubation    told by [facility] that she was hard to intubate 15b yrs ago in [place]- surgery since then no problems  . GERD (gastroesophageal reflux disease)   . H/O echocardiogram 07/2012   [place] LVF w grade I siastolic dysfunction   . History of attention deficit disorder   . History of endometriosis   . History of pleural effusion   . Hyperlipidemia LDL goal <70 06/24/2016  . Hypertension   . 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 Memory difficulty 01/26/2017  . Migraines   . Obesity   . OSA (obstructive sleep apnea)    uses oral appliance instead of CPAP  . PAT (paroxysmal atrial tachycardia) (HCC)    s/p ablation  . PONV (postoperative nausea and vomiting)   . PTSD (post-traumatic stress disorder)   . PVC's (premature ventricular contractions)   . Renal mass 02/15/2012  . RLS (restless legs syndrome)    Dr Gwenette Greet  . rt renal ca dx'd 12/2009   no treatment/ no surg  . Vertigo     Past Surgical History:  Procedure Laterality Date  . CARDIAC ELECTROPHYSIOLOGY [place] AND ABLATION  2000s  . LEFT HEART CATH AND CORONARY ANGIOGRAPHY N/A 06/08/2016   Procedure: Left Heart Cath and Coronary Angiography;  Surgeon: Belva Crome, MD;  Location: [facility];  Service: Cardiovascular;  Laterality: N/A;  . nasoseptal reconstruction  1990s  . THYROIDECTOMY  1990  . TONSILLECTOMY     as a child  . TUBAL LIGATION  1970s  . uterine mass removal  03/2012   was found to be benign    Family Psychiatric History:  Family History  Problem Relation Age of Onset  . Allergies Father   . Skin cancer Father   . Bipolar disorder Father   . Alcohol abuse Father   . Heart disease Mother   . Depression Mother   . Hypertension Mother   . CAD Mother   . Colon cancer Paternal Grandmother   . OCD Paternal Grandmother   . Allergies Sister        multiple  . Allergies Brother        multiple  . Allergies Daughter   . Heart disease Maternal Grandfather   . Cancer Paternal Grandfather     Social History:  Social History   Socioeconomic History  . Marital status: Single    Spouse name: Not on file  . Number of children: 1  . Years of education: 69  . Highest education level: Not on file  Occupational History  . 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 Financial resource strain: Not on file  . Food insecurity:    Worry: Not on file    Inability:  Not on file  . Transportation needs:    Medical: Not on file    Non-medical: Not on file  Tobacco Use  . Smoking status: Never Smoker  . Smokeless tobacco: Never Used  Substance and Sexual Activity  . Alcohol use: Yes    Alcohol/week: 1.0 standard drinks    Types: 1 Glasses of wine per week  . Drug use: No  . Sexual activity: Yes    Partners: Male    Birth control/protection: None  Lifestyle  . Physical activity:    Days per week: Not  on file    Minutes per session: Not on file  . Stress: Not on file  Relationships  . Social connections:    Talks on phone: Not on file    Gets together: Not on file    Attends religious service: Not on file    Active member of club or organization: Not on file    Attends meetings of clubs or organizations: Not on file    Relationship status: Not on file  Other Topics Concern  . Not on file  Social History Narrative   Divorced and lives alone   Has children   Caffeine use: none   Right handed     Allergies:  Allergies  Allergen Reactions  . Geodon [Ziprasidone Hydrochloride] Other (See Comments)    Extremely agitated  . Lithium Nausea Only and Other (See Comments)    Off balance, increased heart rate  . Talwin [Pentazocine] Other (See Comments)    Hallucinations   . Toradol [Ketorolac Tromethamine] Other (See Comments)    Chest pains  . Ziprasidone Other (See Comments)    Extremely agitated  . Abilify [Aripiprazole] Other (See Comments)    jerking  . Compazine [Prochlorperazine Edisylate] Nausea And Vomiting  . Latuda [Lurasidone Hcl] Other (See Comments)    Reports made her mind race more and irritable   . Pristiq [Desvenlafaxine Succinate Er] Other (See Comments)    Did not work, prefers not to take  . Rexulti [Brexpiprazole] Swelling    Elevated BP, created aggression  . Diflucan [Fluconazole] Nausea And Vomiting    Metabolic Disorder Labs: No results found for: HGBA1C, MPG No results found for: PROLACTIN Lab Results   Component Value Date   CHOL 167 04/21/2013   TRIG 106 04/21/2013   HDL 44 04/21/2013   CHOLHDL 3.8 04/21/2013   VLDL 21 04/21/2013   LDLCALC 102 (H) 04/21/2013   Lab Results  Component Value Date   TSH 1.700 10/07/2017   TSH 2.109 04/21/2013    Therapeutic Level Labs: No results found for: LITHIUM Lab Results  Component Value Date   VALPROATE 37 (L) 10/07/2017   VALPROATE 15 (L) 03/31/2017   No components found for:  CBMZ  Current Medications: Current Outpatient Medications  Medication Sig Dispense Refill  . acetaminophen (TYLENOL) 500 MG tablet Take 500 mg by mouth every 6 (six) hours as needed (joint pain).     Marland Kitchen acetaminophen (TYLENOL) 650 MG CR tablet Take 650 mg by mouth every 8 (eight) hours as needed for pain.    Marland Kitchen aspirin 81 MG EC tablet Take 1 tablet (81 mg total) by mouth daily. For heart health 30 tablet 1  . BYSTOLIC 2.5 MG tablet TAKE 1 TABLET BY MOUTH EVERY DAY 30 tablet 0  . cholecalciferol (VITAMIN D) 1000 units tablet Take 2,000 Units by mouth daily.    Marland Kitchen dimenhyDRINATE (DRAMAMINE) 50 MG tablet Take 50 mg by mouth every 8 (eight) hours as needed for nausea or dizziness.    . diphenhydrAMINE (BENADRYL) 25 MG tablet Take 25 mg by mouth 2 (two) times daily as needed for allergies.    Marland Kitchen divalproex (DEPAKOTE ER) 250 MG 24 hr tablet Take 1 tablet (250 mg total) by mouth daily. 30 tablet 1  . DULoxetine (CYMBALTA) 30 MG capsule Take 2 capsules (60 mg total) by mouth daily. 60 capsule 1  . levothyroxine (SYNTHROID, LEVOTHROID) 88 MCG tablet Take 1 tablet (88 mcg total) by mouth daily before breakfast. For low thyroid function    .  LORazepam (ATIVAN) 1 MG tablet Take 1 tablet (1 mg total) by mouth 2 (two) times daily as needed for anxiety. 60 tablet 1  . losartan-hydrochlorothiazide (HYZAAR) 100-25 MG tablet Take 1 tablet by mouth daily.  2  . Magnesium 500 MG TABS Take 500 mg by mouth daily.    Marland Kitchen OVER THE COUNTER MEDICATION CBD Oil-1 dropperfull twice daily    .  rOPINIRole (REQUIP) 0.5 MG tablet TAKE 1 TABLET BY MOUTH IN THE AFTERNOON AND 1 TABLET AT BEDTIME (Patient taking differently: Take 1 tablet as needed for sleep) 30 tablet 1  . traZODone (DESYREL) 50 MG tablet Take 1.5 tablets (75 mg total) by mouth at bedtime. 45 tablet 1   No current facility-administered medications for this visit.      Musculoskeletal: Strength & Muscle Tone: within normal limits Gait & Station: normal Patient leans: N/A  Psychiatric Specialty Exam: Review of Systems  Constitutional: Negative for chills, diaphoresis and fever.  Psychiatric/Behavioral: Positive for depression. Negative for hallucinations and suicidal ideas. The patient is nervous/anxious. The patient does not have insomnia.     Blood pressure 128/74, pulse 70, height 5' 3.5" (1.613 m), weight 210 lb (95.3 kg).Body mass index is 36.62 kg/m.  General Appearance: Fairly Groomed  Eye Contact:  Good  Speech:  Clear and Coherent and Normal Rate  Volume:  Normal  Mood:  Euthymic  Affect:  Full Range and Tearful  Thought Process:  Goal Directed and Descriptions of Associations: Intact  Orientation:  Full (Time, Place, and Person)  Thought Content:  Rumination  Suicidal Thoughts:  No  Homicidal Thoughts:  No  Memory:  Immediate;   Good Recent;   Good Remote;   Good  Judgement:  Poor  Insight:  Fair  Psychomotor Activity:  Normal  Concentration:  Concentration: Good and Attention Span: Good  Recall:  Good  Fund of Knowledge:  Good  Language:  Good  Akathisia:  No  Handed:  Right  AIMS (if indicated):     Assets:  Communication Skills Desire for Improvement Transportation  ADL's:  Intact  Cognition:  WNL  Sleep:   poor     Screenings: AUDIT     Admission (Discharged) from 04/26/2013 in [facility] Admission (Discharged) from 06/29/2012 in [facility]  Alcohol Use Disorder Identification Test Final Score (AUDIT)  0  0     Mini-Mental     Office Visit from 01/26/2017 in [facility]  Total Score (max 30 points )  28    PHQ2-9     Counselor from 08/06/2015 in [facility]  PHQ-2 Total Score  6  PHQ-9 Total Score  26      I reviewed the information below on 11/10/2017 and have updated it Assessment and Plan: Bipolar disorder-current episode depressed, severe and recurrent without psychotic features; GAD; PTSD; cluster B personality disorder    Medication management with supportive therapy. Risks and benefits, side effects and alternative treatment options discussed with patient. Pt was given an opportunity to ask questions about medication, illness, and treatment. All current psychiatric medications have been reviewed and discussed with the patient and adjusted as clinically appropriate. The patient has been provided an accurate and updated list of the medications being now prescribed. Patient expressed understanding of how their medications were to be used.  Pt verbalized understanding and verbal consent obtained for treatment.  The risk of un-intended pregnancy is low based on the fact that pt  reports she is post menopausal. Pt is aware that these meds carry a teratogenic risk. Pt will discuss plan of action if she does or plans to become pregnant in the future.  Status of current problems: overall stable  Meds: Ativan 1mg  po BID prn anxiety- take one at bedtime Decrease Depakote ER 250 mg p.o. nightly for bipolar disorder. Pt was not able to tolerate the sedation with 1000mg  Cymbalta 60mg  po qD for mood Increase Trazodone 75mg  po qHS for insomnia- 50mg  ineffective  Labs: none  Therapy: brief supportive therapy provided. Discussed psychosocial stressors in detail.     Consultations: encouraged to follow up with therapist Encouraged to follow up with PCP as needed   Pt denies SI and is at an acute low risk for suicide. Patient told to call clinic if any problems  occur. Patient advised to go to ER if they should develop SI/HI, side effects, or if symptoms worsen. Has crisis numbers to call if needed. Pt verbalized understanding.  F/up in 2 months or sooner if needed.  Patient states her therapist suggested she tore with the pace program.  Patient states that she has set up an appointment and will let us know of her decision.  If she accepts then her medical and psychiatric care will be transferred to their physicians.    [name], MD 11/10/2017, 2:28 PM

## 2017-11-11 ENCOUNTER — Other Ambulatory Visit (HOSPITAL_COMMUNITY): Payer: Medicare Other

## 2017-11-14 ENCOUNTER — Other Ambulatory Visit (HOSPITAL_COMMUNITY): Payer: Medicare Other

## 2017-11-15 ENCOUNTER — Ambulatory Visit (INDEPENDENT_AMBULATORY_CARE_PROVIDER_SITE_OTHER): Payer: Medicare Other | Admitting: Psychiatry

## 2017-11-15 ENCOUNTER — Other Ambulatory Visit (HOSPITAL_COMMUNITY): Payer: Medicare Other

## 2017-11-15 DIAGNOSIS — F319 Bipolar disorder, unspecified: Secondary | ICD-10-CM

## 2017-11-16 ENCOUNTER — Other Ambulatory Visit (HOSPITAL_COMMUNITY): Payer: Medicare Other

## 2017-11-17 ENCOUNTER — Other Ambulatory Visit (HOSPITAL_COMMUNITY): Payer: Medicare Other

## 2017-11-17 NOTE — Progress Notes (Signed)
   THERAPIST PROGRESS NOTE  Session Time:1:15-2:00  Participation Level:Active  Behavioral Response:CasualAlertBrightenedAffect  Type of Therapy: Individual Therapy  Treatment Goals addressed: [place]; emotional regulation; problem solving  Interventions: solution focused  Summary: [name] a 71y.o.femalewho presents withbipolar disorder1  Suicidal/Homicidal:No without intent/plan  Therapist Response:Pt. presents with significantly improved presentation, calm, brightened affect, talkative. Pt. Discussed that she missed last week's session because her boyfriend was hospitalized with sepsis. Pt. Discussed that has been very busy with caregiving which has energized her during the past week. Pt. Discussed that her appetite has been good, but she has been eating less food and has lost some weight and she was happy about this. Pt. Expressed some hesitance about the [facility] program, but she received encouragement from the therapist that she should at a minimum go through the assessment process and orient with the program before making a decision. Pt. Agreed that it would be helpful to have her healthcare providers in one place and that she needed the socialization that the program would provide. Pt. Discussed that she has not applied for an extention for her section 8 and that she continues to be very passive in looking for a new apartment, despite being clear that she cannot afford to lose her voucher. Therapist attempted to raise awareness to thought blocks to Pt.'s low motivation to look for a job. Pt. Received encouragement from the therapist to move forward in the process and to sit with her fear of moving to a new place.  Plan:Pt.to complete assessment for the [facility]ACE program and follow up with counselor in 1 week.  [place], [facility] 11/17/2017

## 2017-11-18 ENCOUNTER — Other Ambulatory Visit (HOSPITAL_COMMUNITY): Payer: Medicare Other

## 2017-11-21 ENCOUNTER — Ambulatory Visit (HOSPITAL_COMMUNITY): Payer: Self-pay | Admitting: Psychiatry

## 2017-11-22 ENCOUNTER — Other Ambulatory Visit (HOSPITAL_COMMUNITY): Payer: Medicare Other

## 2017-11-23 ENCOUNTER — Ambulatory Visit: Payer: Self-pay

## 2017-11-23 ENCOUNTER — Other Ambulatory Visit (HOSPITAL_COMMUNITY): Payer: Medicare Other

## 2017-11-24 ENCOUNTER — Other Ambulatory Visit (HOSPITAL_COMMUNITY): Payer: Medicare Other

## 2017-11-25 ENCOUNTER — Other Ambulatory Visit (HOSPITAL_COMMUNITY): Payer: Medicare Other

## 2017-11-28 ENCOUNTER — Ambulatory Visit (INDEPENDENT_AMBULATORY_CARE_PROVIDER_SITE_OTHER): Payer: Medicare Other | Admitting: Psychiatry

## 2017-11-28 ENCOUNTER — Ambulatory Visit: Payer: Self-pay | Admitting: Adult Health

## 2017-11-28 DIAGNOSIS — F319 Bipolar disorder, unspecified: Secondary | ICD-10-CM

## 2017-11-29 ENCOUNTER — Other Ambulatory Visit (HOSPITAL_COMMUNITY): Payer: Medicare Other

## 2017-11-29 ENCOUNTER — Other Ambulatory Visit (HOSPITAL_COMMUNITY): Payer: Self-pay | Admitting: Family

## 2017-11-29 DIAGNOSIS — F3130 Bipolar disorder, current episode depressed, mild or moderate severity, unspecified: Secondary | ICD-10-CM

## 2017-11-30 ENCOUNTER — Other Ambulatory Visit (HOSPITAL_COMMUNITY): Payer: Medicare Other

## 2017-12-01 NOTE — Progress Notes (Signed)
   THERAPIST PROGRESS NOTE  Session Time:1:10-2:00  Participation Level:Active  Behavioral Response:CasualAlertBrightenedAffect  Type of Therapy: Individual Therapy  Treatment Goals addressed: [place]; emotional regulation; problem solving  Interventions: solution focused  Summary: [name] a 71y.o.femalewho presents withbipolar disorder1  Suicidal/Homicidal:No without intent/plan  Therapist Response:Pt.continues to present with calm, brightened affect, talkative. Pt. Discussed that she made the decision to let her section 8 voucher lapse in favor of pursuing private landlords. Pt. Reports that she and her boyfriend are getting along better and she has had more energy to engage in her self-care and cleaning the house. Pt. Discussed her decision to not pursue the [facility] program because she was not comfortable transferring her providers to the program. Pt. Counselor informed Pt. That she would be leaving the clinic on 9/20 and that we needed to discuss plans for the Pt.'s transfer to another therapist. Pt. Became very tearful and quiet. Significant time was spent processing the pt.'s progress in learning to manage her depression, recognized her triggers, and work on self-care, personal boundaries, problem solving challenges with self-care and communication with her boyfriend. Pt. Agreed to complete final session next week to discuss her transition plan.   Plan:Pt. To follow up with counselor in 1 week for transition planning.  [place], [facility] 12/01/2017

## 2017-12-05 ENCOUNTER — Telehealth (HOSPITAL_COMMUNITY): Payer: Self-pay

## 2017-12-05 ENCOUNTER — Ambulatory Visit (INDEPENDENT_AMBULATORY_CARE_PROVIDER_SITE_OTHER): Payer: Medicare Other | Admitting: Psychiatry

## 2017-12-05 DIAGNOSIS — F319 Bipolar disorder, unspecified: Secondary | ICD-10-CM | POA: Diagnosis not present

## 2017-12-05 DIAGNOSIS — F900 Attention-deficit hyperactivity disorder, predominantly inattentive type: Secondary | ICD-10-CM

## 2017-12-05 NOTE — Telephone Encounter (Signed)
Patient has been discharged by [facility] for multiple missed new patient appointments. Patient wants to know if she is able to continue to see you. Please review and advise, thank you

## 2017-12-07 ENCOUNTER — Other Ambulatory Visit (HOSPITAL_COMMUNITY): Payer: Self-pay | Admitting: Psychiatry

## 2017-12-07 NOTE — Progress Notes (Signed)
THERAPIST PROGRESS NOTE  Session Time:1:10-2:00  Participation Level:Active  Behavioral Response:CasualAlertBrightenedAffect  Type of Therapy: Individual Therapy  Treatment Goals addressed: [place]; emotional regulation; problem solving  Interventions: cognitive behavioral therapy  Summary: Brooke Filo Sciandrais a 71y.o.femalewho presents withbipolar disorder1  Suicidal/Homicidal:No without intent/plan  Therapist Response:Pt.continues to present with calm, brightened affect, talkative. Pt. Discussed that she was feeling very good, much better than in past few months. Counselor discussed trend of her doing better emotionally when things were good in her relationship with her boyfriend/cycle of codependence. Pt. Discussed that she recognizes that she does not get her emotional needs met consistently in the relationship but is making the choice to stay with him right now and is aware of the consequences of her choice.  Pt. Reported that she is no longer looking for private landlords because she does not think she will be able to afford it long-term and is planning for her appeal for her section 8 voucher. Counselor discussed plans for transition to new therapist when Counselor leaves the office on 9/20. Counselor recommended Oliver Pila [facility] with [facility] and provided contact and referral information. Pt. Discussed that she feels that she needs a stimulant because she was doing well when it was prescribed for her. Pt. Was encouraged to discuss during her next appointment with Dr. Darrall Dears.  Plan:Pt. To follow up Dr. Darrall Dears for ongoing psychiatry and with Oliver Pila, [facility].  [place], [facility] 12/07/2017

## 2017-12-10 ENCOUNTER — Other Ambulatory Visit (HOSPITAL_COMMUNITY): Payer: Self-pay | Admitting: Psychiatry

## 2017-12-10 DIAGNOSIS — F3130 Bipolar disorder, current episode depressed, mild or moderate severity, unspecified: Secondary | ICD-10-CM

## 2017-12-12 ENCOUNTER — Ambulatory Visit (HOSPITAL_COMMUNITY): Payer: Self-pay | Admitting: Psychiatry

## 2017-12-15 ENCOUNTER — Other Ambulatory Visit: Payer: Self-pay

## 2017-12-15 ENCOUNTER — Telehealth: Payer: Self-pay | Admitting: Cardiology

## 2017-12-15 MED ORDER — NEBIVOLOL HCL 2.5 MG PO TABS: 2.5000 mg | ORAL_TABLET | Freq: Every day | ORAL | 0 refills | Status: DC

## 2017-12-15 NOTE — Telephone Encounter (Signed)
Patient would be better served by a provider who is available full-time.  She was unhappy with her previous care and asked to be referred out.

## 2017-12-15 NOTE — Telephone Encounter (Signed)
New Message     *STAT* If patient is at the pharmacy, call can be transferred to refill team.   1. Which medications need to be refilled? (please list name of each medication and dose if known) BYSTOLIC 2.5 MG tablet  2. Which pharmacy/location (including street and city if local pharmacy) is medication to be sent to? [id number] - [place], [place] [address]  3. Do they need a 30 day or 90 day supply? 90 day

## 2017-12-19 ENCOUNTER — Ambulatory Visit (HOSPITAL_COMMUNITY): Payer: Self-pay | Admitting: Psychiatry

## 2017-12-22 NOTE — Progress Notes (Deleted)
Cardiology Office Note:    Date:  12/22/2017   ID:  [name], DOB 11-01-1945, MRN [id number]  PCP:  Leighton Ruff, MD  Cardiologist:  Fransico Him, MD    Referring MD: Leighton Ruff, MD   No chief complaint on file.   History of Present Illness:    [name] is a 72 y.o. female with a hx of PVC's, PAT s/p ablation and HTN.  She also has ASCAD with  coronary CTA which showing  a calcium score of 364 and one vessel CAD with moderate disease of the ostial and prox LAD and moderate - severe stenosis of the mid LAD.  Cath showed 50-70% mid LAD proximal to the D1 and 75-80% mid OM with luminal irregularities in the mid RCA with normal LVF.  Medical therapy was recommended first and if continues to have angina or ischemia in anterior wall then would consider PCI of the OM.  It was felt that the LAD would be higher risk due to significant calcification and angulation.    She is here today for followup and is doing well.  She denies any chest pain or pressure, SOB, DOE, PND, orthopnea, LE edema, dizziness, palpitations or syncope. She is compliant with her meds and is tolerating meds with no SE.    Past Medical History:  Diagnosis Date  . Abscess of Bartholin's gland   . Acute vestibular neuronitis    Dr Lucia Gaskins  . ADHD (attention deficit hyperactivity disorder)   . Adverse effect of general anesthetic    felt paralyzed while receiving anesthesia  . Anxiety   . Bipolar 1 disorder ([facility])   . CAD (coronary artery disease), native coronary artery    cath 05/2016 showing 50-70% stenosis in the mid LAD proximal to the first diagonal and 70-80% small OM1. FFR of LAD  not performed because of difficulty with catheter control from the right radial.   . Chronic kidney disease    kidney cancer- pt states she has elected to not have it treated.  . Depression    Bipolar disorder/goes to [facility] for meds  . Difficult intubation    told by [facility] that she was hard to intubate  15b yrs ago in [place]- surgery since then no problems  . GERD (gastroesophageal reflux disease)   . H/O echocardiogram 07/2012   [place] LVF w grade I siastolic dysfunction   . History of attention deficit disorder   . History of endometriosis   . History of pleural effusion   . Hyperlipidemia LDL goal <70 06/24/2016  . Hypertension   . 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 Memory difficulty 01/26/2017  . Migraines   . Obesity   . OSA (obstructive sleep apnea)    uses oral appliance instead of CPAP  . PAT (paroxysmal atrial tachycardia) ([facility])    s/p ablation  . PONV (postoperative nausea and vomiting)   . PTSD (post-traumatic stress disorder)   . PVC's (premature ventricular contractions)   . Renal mass 02/15/2012  . RLS (restless legs syndrome)    Dr Gwenette Greet  . rt renal ca dx'd 12/2009   no treatment/ no surg  . Vertigo     Past Surgical History:  Procedure Laterality Date  . CARDIAC ELECTROPHYSIOLOGY [place] AND ABLATION  2000s  . LEFT HEART CATH AND CORONARY ANGIOGRAPHY N/A 06/08/2016   Procedure: Left Heart Cath and Coronary Angiography;  Surgeon: Belva Crome, MD;  Location: [facility];  Service: Cardiovascular;  Laterality: N/A;  . nasoseptal reconstruction  1990s  . THYROIDECTOMY  1990  . TONSILLECTOMY     as a child  . TUBAL LIGATION  1970s  . uterine mass removal  03/2012   was found to be benign    Current Medications: No outpatient medications have been marked as taking for the 12/23/17 encounter (Appointment) with Sueanne Margarita, MD.     Allergies:   Geodon [ziprasidone hydrochloride]; Lithium; Talwin [pentazocine]; Toradol [ketorolac tromethamine]; Ziprasidone; Abilify [aripiprazole]; Compazine [prochlorperazine edisylate]; Latuda [lurasidone hcl]; Pristiq [desvenlafaxine succinate er]; Rexulti [brexpiprazole]; and Diflucan [fluconazole]   Social History   Socioeconomic History  . Marital status: Single    Spouse name:  Not on file  . Number of children: 1  . Years of education: 32  . Highest education level: Not on file  Occupational History  . 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 Financial resource strain: Not on file  . Food insecurity:    Worry: Not on file    Inability: Not on file  . Transportation needs:    Medical: Not on file    Non-medical: Not on file  Tobacco Use  . Smoking status: Never Smoker  . Smokeless tobacco: Never Used  Substance and Sexual Activity  . Alcohol use: Yes    Alcohol/week: 1.0 standard drinks    Types: 1 Glasses of wine per week  . Drug use: No  . Sexual activity: Yes    Partners: Male    Birth control/protection: None  Lifestyle  . Physical activity:    Days per week: Not on file    Minutes per session: Not on file  . Stress: Not on file  Relationships  . Social connections:    Talks on phone: Not on file    Gets together: Not on file    Attends religious service: Not on file    Active member of club or organization: Not on file    Attends meetings of clubs or organizations: Not on file    Relationship status: Not on file  Other Topics Concern  . Not on file  Social History Narrative   Divorced and lives alone   Has children   Caffeine use: none   Right handed      Family History: The patient's family history includes Alcohol abuse in her father; Allergies in her brother, daughter, father, and sister; Bipolar disorder in her father; CAD in her mother; Cancer in her paternal grandfather; Colon cancer in her paternal grandmother; Depression in her mother; Heart disease in her maternal grandfather and mother; Hypertension in her mother; OCD in her paternal grandmother; Skin cancer in her father.  ROS:   Please see the history of present illness.    ROS  All other systems reviewed and negative.   EKGs/Labs/Other Studies Reviewed:    The following studies were reviewed today: none  EKG:  EKG is  ordered today.   The ekg ordered today demonstrates ***  Recent Labs: 10/07/2017: TSH 1.700 10/14/2017: ALT 15; BUN 21; Creatinine, Ser 0.87; Hemoglobin 11.5; Platelets 231; Potassium 3.9; Sodium 143   Recent Lipid Panel    Component Value Date/Time   CHOL 167 04/21/2013 0530   TRIG 106 04/21/2013 0530   HDL 44 04/21/2013 0530   CHOLHDL 3.8 04/21/2013 0530   VLDL 21 04/21/2013 0530   LDLCALC 102 (H) 04/21/2013 0530    Physical Exam:    VS:  There were no vitals  taken for this visit.    Wt Readings from Last 3 Encounters:  10/14/17 215 lb (97.5 kg)  06/21/17 213 lb (96.6 kg)  01/26/17 220 lb (99.8 kg)     GEN:  Well nourished, well developed in no acute distress HEENT: Normal NECK: No JVD; No carotid bruits LYMPHATICS: No lymphadenopathy CARDIAC: RRR, no murmurs, rubs, gallops RESPIRATORY:  Clear to auscultation without rales, wheezing or rhonchi  ABDOMEN: Soft, non-tender, non-distended MUSCULOSKELETAL:  No edema; No deformity  SKIN: Warm and dry NEUROLOGIC:  Alert and oriented x 3 PSYCHIATRIC:  Normal affect   ASSESSMENT:    1. PAT (paroxysmal atrial tachycardia) ([facility])   2. Essential hypertension, benign   3. PVC's (premature ventricular contractions)   4. Coronary artery disease involving native coronary artery of native heart without angina pectoris   5. Hyperlipidemia LDL goal <70    PLAN:    In order of problems listed above:  1.  PAT -  S/p ablation.  She has not had any further palpitations.  Continue on Bystolic 2.5mg  daily.    2  HTN - BP is well controlled on exam today.  She will continue on Hyzaar 100-25mg  daily and Bystolic 2.5mg  daily.    3.  PVCs - theses are well suppressed on BB.    4.  ASCAD - coronary CTA showed a calcium score of 364 and one vessel CAD with moderate disease of the ostial and prox LAD and moderate - severe stenosis of the mid LAD.  Cath showed 50-70% mid LAD proximal to the D1 and 75-80% mid OM with luminal irregularities in the mid RCA with  normal LVF.  Medical therapy was recommended first and if continues to have angina or ischemia in anterior wall then would consider PCI of the OM.  It was felt that the LAD would be higher risk due to significant calcification and angulation.  She has not had any anginal sx.  She will continue on ASA 81mg  daily, BB and    5.  Hyperlipidemia - LDL goal is < 70.  She will continue on    Medication Adjustments/Labs and Tests Ordered: Current medicines are reviewed at length with the patient today.  Concerns regarding medicines are outlined above.  No orders of the defined types were placed in this encounter.  No orders of the defined types were placed in this encounter.   Signed, Fransico Him, MD  12/22/2017 10:02 PM    [place]

## 2017-12-23 ENCOUNTER — Ambulatory Visit: Payer: Self-pay | Admitting: Cardiology

## 2017-12-27 ENCOUNTER — Ambulatory Visit: Payer: Self-pay

## 2017-12-30 ENCOUNTER — Other Ambulatory Visit: Payer: Self-pay | Admitting: Pulmonary Disease

## 2018-01-02 ENCOUNTER — Ambulatory Visit (INDEPENDENT_AMBULATORY_CARE_PROVIDER_SITE_OTHER): Payer: Medicare Other | Admitting: Licensed Clinical Social Worker

## 2018-01-02 DIAGNOSIS — F319 Bipolar disorder, unspecified: Secondary | ICD-10-CM

## 2018-01-02 NOTE — Progress Notes (Signed)
   THERAPIST PROGRESS NOTE  Session Time: 4:05pm-4:55pm  Participation Level: Active  Behavioral Response: CasualAlertAnxious and Depressed  Type of Therapy: Individual Therapy  Treatment Goals addressed: Coping  Interventions: CBT, Motivational Interviewing and Supportive  Summary: [name] is a 72 y.o. female who presents with Bipolar Disorder with increased symptoms of depression. Client reports frustration with current relationship and needing to find a new living arrangement. Client reports she knows her depression has gotten worse because she stopped taking care of herself as well and her eating patterns have changed. Client was open to group therapy to learn new skills to cope with negative thinking. Client also reports she will be engaging with [facility] and is already involved in a group with her church.   Suicidal/Homicidal: Nowithout intent/plan  Therapist Response: Clinician inquired about current problematic symptoms and goals for therapy. Clinician provided client the opportunity for group therapy and explained types of therapy (CBT, DBT.) Clinician utilized active listening and reflective statements to build rapport with client. Clinician provided client with examples of creating more helpful thoughts to improve mood based on client's statements. Clinician assessed for SI/HI/psychosis.  Plan: Return again in 1-2 weeks for CBT group Therapy Monday at 2pm. Individual sessions as needed  Diagnosis: Axis I: Bipolar, Depressed   Olegario Messier, LCSW 01/02/2018

## 2018-01-09 ENCOUNTER — Ambulatory Visit (INDEPENDENT_AMBULATORY_CARE_PROVIDER_SITE_OTHER): Payer: Medicare Other | Admitting: Licensed Clinical Social Worker

## 2018-01-09 DIAGNOSIS — F319 Bipolar disorder, unspecified: Secondary | ICD-10-CM

## 2018-01-11 ENCOUNTER — Ambulatory Visit: Payer: Self-pay | Admitting: Adult Health

## 2018-01-11 NOTE — Progress Notes (Signed)
   THERAPIST PROGRESS NOTE  Session Time: 2-3pm  Participation Level: Active  Behavioral Response: CasualAlertAnxious, Depressed and Hopeless  Type of Therapy: Group Therapy  Treatment Goals addressed: Diagnosis: Bipolar 1 Disorder  Interventions: CBT, Motivational Interviewing and Supportive  Summary: [name] is a 72 y.o. female who presents with bipolar disorder. Client verbalized continued concern over her living situations but states she has not made progress toward finding a new arrangement. Client appeared distraught over having to sleep on a bed she was not comfortable with due to sanitary reasons. Client became tearful several times during discussion but remained redirectable. Client problem solved that she could request mattresses be traded so she could have her original mattress, even if on a different bed frame.  Suicidal/Homicidal: Nowithout intent/plan  Therapist Response: Therapist checked in with group members, assessing for SI/HI/psychosis. Clinician requested group members identify which symptoms are currently most problematic and rank symptoms. Clinician presented several breathing exercises as a mindfulness activity to help alleviate some tension in a stressful moment. Clinician demonstrated box breathing and requested client participate. Clinician provided psycho-education on CBT and the connection of thoughts, emotions and behaviors. Clinician and group members briefly processed how expectations for interactions, which are thoughts, play a factor in our responses to situations, or behaviors. Clinician encouraged group members to add gratitude into situations which cause frustration and end the interaction, rather than escalating the event.   Plan: Return again in 1-2 weeks.  Diagnosis: Axis I: Bipolar, Depressed    Axis II: r/o BPD    Olegario Messier, LCSW 01/09/2018

## 2018-01-12 ENCOUNTER — Ambulatory Visit: Payer: Self-pay | Admitting: Adult Health

## 2018-01-12 ENCOUNTER — Ambulatory Visit (INDEPENDENT_AMBULATORY_CARE_PROVIDER_SITE_OTHER): Payer: Medicare Other | Admitting: Psychiatry

## 2018-01-12 ENCOUNTER — Encounter (HOSPITAL_COMMUNITY): Payer: Self-pay | Admitting: Psychiatry

## 2018-01-12 VITALS — BP 128/66 | HR 74 | Ht 63.5 in | Wt 212.0 lb

## 2018-01-12 DIAGNOSIS — F4310 Post-traumatic stress disorder, unspecified: Secondary | ICD-10-CM

## 2018-01-12 DIAGNOSIS — F411 Generalized anxiety disorder: Secondary | ICD-10-CM | POA: Diagnosis not present

## 2018-01-12 DIAGNOSIS — F3130 Bipolar disorder, current episode depressed, mild or moderate severity, unspecified: Secondary | ICD-10-CM

## 2018-01-12 MED ORDER — DIVALPROEX SODIUM ER 250 MG PO TB24: 750.0000 mg | ORAL_TABLET | Freq: Every day | ORAL | 1 refills | Status: DC

## 2018-01-12 MED ORDER — LORAZEPAM 1 MG PO TABS: 1.0000 mg | ORAL_TABLET | Freq: Two times a day (BID) | ORAL | 1 refills | Status: DC | PRN
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
## 2018-01-16 ENCOUNTER — Ambulatory Visit (HOSPITAL_COMMUNITY): Payer: Medicare Other | Admitting: Licensed Clinical Social Worker

## 2018-01-18 ENCOUNTER — Other Ambulatory Visit: Payer: Self-pay | Admitting: Cardiology

## 2018-01-19 ENCOUNTER — Encounter: Payer: Self-pay | Admitting: Cardiology

## 2018-01-19 ENCOUNTER — Encounter

## 2018-01-19 ENCOUNTER — Ambulatory Visit (INDEPENDENT_AMBULATORY_CARE_PROVIDER_SITE_OTHER): Payer: Medicare Other | Admitting: Cardiology

## 2018-01-19 VITALS — BP 108/58 | HR 76 | Ht 63.5 in | Wt 212.4 lb

## 2018-01-19 DIAGNOSIS — I10 Essential (primary) hypertension: Secondary | ICD-10-CM | POA: Diagnosis not present

## 2018-01-19 DIAGNOSIS — R0602 Shortness of breath: Secondary | ICD-10-CM

## 2018-01-19 DIAGNOSIS — I471 Supraventricular tachycardia: Secondary | ICD-10-CM | POA: Diagnosis not present

## 2018-01-19 DIAGNOSIS — E785 Hyperlipidemia, unspecified: Secondary | ICD-10-CM

## 2018-01-19 DIAGNOSIS — I2510 Atherosclerotic heart disease of native coronary artery without angina pectoris: Secondary | ICD-10-CM

## 2018-01-19 MED ORDER — LOSARTAN POTASSIUM-HCTZ 50-12.5 MG PO TABS: 1.0000 | ORAL_TABLET | Freq: Every day | ORAL | 3 refills | Status: DC

## 2018-01-19 NOTE — Progress Notes (Signed)
Cardiology Office Note:    Date:  01/19/2018   ID:  [name], DOB [birth year], MRN [id number]  PCP:  Leighton Ruff, MD  Cardiologist:  Fransico Him, MD    Referring MD: Leighton Ruff, MD   Chief Complaint  Patient presents with  . Follow-up    PAT, HTN, CAD,    History of Present Illness:    [name] is a 72 y.o. female with a hx of PVC's, diastolic dysfunction, PAT s/p ablation and HTN.  She also has ASCAD with coronary CTA showing a calcium score of 364 and one vessel CAD with moderate disease of the ostial and prox LAD and moderate - severe stenosis of the mid LAD.  She subsequently underwent cath showing 50-70% mid LAD proximal to the D1 and 75-80% mid OM with luminal irregularities in the mid RCA with normal LVF.  Medical therapy was recommended first and if continues to have angina or ischemia in anterior wall then would consider PCI of the OM.  It was felt that the LAD would be higher risk due to significant calcification and angulation.  As this was not an ACS, PCI was not performed.  She is here today for followup ot. She is having a lot of medical problems with arthritic pain and depression being her worst problems.  She was Bipolar dz and has not been able to find medications that stabilize her sx.  She has severe fatigue and gets very winded just walking across the parking lot. She is very sedentary due to her severe arthritis.  She denies any chest pain or pressure,  LE edema, dizziness, palpitations or syncope. She is compliant with her meds and is tolerating meds with no SE.     Past Medical History:  Diagnosis Date  . Abscess of Bartholin's gland   . Acute vestibular neuronitis    Dr Lucia Gaskins  . ADHD (attention deficit hyperactivity disorder)   . Adverse effect of general anesthetic    felt paralyzed while receiving anesthesia  . Anxiety   . Bipolar 1 disorder ([place])   . CAD (coronary artery disease), native coronary artery    cath 05/2016  showing 50-70% stenosis in the mid LAD proximal to the first diagonal and 70-80% small OM1. FFR of LAD  not performed because of difficulty with catheter control from the right radial.   . Chronic kidney disease    kidney cancer- pt states she has elected to not have it treated.  . Depression    Bipolar disorder/goes to [facility] for meds  . Difficult intubation    told by [facility] that she was hard to intubate 15b yrs ago in [place]- surgery since then no problems  . GERD (gastroesophageal reflux disease)   . H/O echocardiogram 07/2012   [place] LVF w grade I siastolic dysfunction   . History of attention deficit disorder   . History of endometriosis   . History of pleural effusion   . Hyperlipidemia LDL goal <70 06/24/2016  . Hypertension   . 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 Memory difficulty 01/26/2017  . Migraines   . Obesity   . OSA (obstructive sleep apnea)    uses oral appliance instead of CPAP  . PAT (paroxysmal atrial tachycardia) ([facility])    s/p ablation  . PONV (postoperative nausea and vomiting)   . PTSD (post-traumatic stress disorder)   . PVC's (premature ventricular contractions)   . Renal mass 02/15/2012  . RLS (  restless legs syndrome)    Dr Gwenette Greet  . rt renal ca dx'd 12/2009   no treatment/ no surg  . Vertigo     Past Surgical History:  Procedure Laterality Date  . CARDIAC ELECTROPHYSIOLOGY [place] AND ABLATION  2000s  . LEFT HEART CATH AND CORONARY ANGIOGRAPHY N/A 06/08/2016   Procedure: Left Heart Cath and Coronary Angiography;  Surgeon: Belva Crome, MD;  Location: [facility];  Service: Cardiovascular;  Laterality: N/A;  . nasoseptal reconstruction  1990s  . THYROIDECTOMY  1990  . TONSILLECTOMY     as a child  . TUBAL LIGATION  1970s  . uterine mass removal  03/2012   was found to be benign    Current Medications: Current Meds  Medication Sig  . acetaminophen (TYLENOL) 500 MG tablet Take 500 mg by mouth every 6  (six) hours as needed (joint pain).   Marland Kitchen acetaminophen (TYLENOL) 650 MG CR tablet Take 650 mg by mouth every 8 (eight) hours as needed for pain.  Marland Kitchen aspirin 81 MG EC tablet Take 1 tablet (81 mg total) by mouth daily. For heart health  . Cholecalciferol (VITAMIN D PO) Take 8,000 Units by mouth daily.  Marland Kitchen dimenhyDRINATE (DRAMAMINE) 50 MG tablet Take 25 mg by mouth every 8 (eight) hours as needed for nausea or dizziness.   . diphenhydrAMINE (BENADRYL) 25 MG tablet Take 25 mg by mouth 2 (two) times daily as needed for allergies.  Marland Kitchen divalproex (DEPAKOTE ER) 250 MG 24 hr tablet Take 3 tablets (750 mg total) by mouth daily. (Patient taking differently: Take 250 mg by mouth daily. )  . levothyroxine (SYNTHROID, LEVOTHROID) 88 MCG tablet Take 1 tablet (88 mcg total) by mouth daily before breakfast. For low thyroid function  . LORazepam (ATIVAN) 1 MG tablet Take 1 tablet (1 mg total) by mouth 2 (two) times daily as needed for anxiety.  Marland Kitchen losartan-hydrochlorothiazide (HYZAAR) 100-25 MG tablet Take 1 tablet by mouth daily.  . Magnesium 500 MG TABS Take 500 mg by mouth daily.  . nebivolol (BYSTOLIC) 2.5 MG tablet Take 1 tablet (2.5 mg total) by mouth daily.  Marland Kitchen OVER THE COUNTER MEDICATION CBD Oil-1 dropperfull twice daily  . rOPINIRole (REQUIP) 0.5 MG tablet Take 1 tablet as needed for sleep (Patient taking differently: Take 0.5 mg by mouth at bedtime as needed (Restless leg). Take 1 tablet as needed for sleep)     Allergies:   Geodon [ziprasidone hydrochloride]; Lithium; Talwin [pentazocine]; Toradol [ketorolac tromethamine]; Ziprasidone; Abilify [aripiprazole]; Compazine [prochlorperazine edisylate]; Latuda [lurasidone hcl]; Cbd kings [lido-menthol-methyl sal-camph]; Pristiq [desvenlafaxine succinate er]; Rexulti [brexpiprazole]; and Diflucan [fluconazole]   Social History   Socioeconomic History  . Marital status: Single    Spouse name: Not on file  . Number of children: 1  . Years of education: 4  .  Highest education level: Not on file  Occupational History  . 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 Financial resource strain: Not on file  . Food insecurity:    Worry: Not on file    Inability: Not on file  . Transportation needs:    Medical: Not on file    Non-medical: Not on file  Tobacco Use  . Smoking status: Never Smoker  . Smokeless tobacco: Never Used  Substance and Sexual Activity  . Alcohol use: Yes    Alcohol/week: 1.0 standard drinks    Types: 1 Glasses of wine per week  . Drug use: No  . Sexual activity:  Yes    Partners: Male    Birth control/protection: None  Lifestyle  . Physical activity:    Days per week: Not on file    Minutes per session: Not on file  . Stress: Not on file  Relationships  . Social connections:    Talks on phone: Not on file    Gets together: Not on file    Attends religious service: Not on file    Active member of club or organization: Not on file    Attends meetings of clubs or organizations: Not on file    Relationship status: Not on file  Other Topics Concern  . Not on file  Social History Narrative   Divorced and lives alone   Has children   Caffeine use: none   Right handed      Family History: The patient's family history includes Alcohol abuse in her father; Allergies in her brother, daughter, father, and sister; Bipolar disorder in her father; CAD in her mother; Cancer in her paternal grandfather; Colon cancer in her paternal grandmother; Depression in her mother; Heart disease in her maternal grandfather and mother; Hypertension in her mother; OCD in her paternal grandmother; Skin cancer in her father.  ROS:   Please see the history of present illness.    ROS  All other systems reviewed and negative.   EKGs/Labs/Other Studies Reviewed:    The following studies were reviewed today: none  EKG:  EKG is not  ordered today.    Recent Labs: 10/07/2017: TSH 1.700 10/14/2017: ALT 15; BUN 21;  Creatinine, Ser 0.87; Hemoglobin 11.5; Platelets 231; Potassium 3.9; Sodium 143   Recent Lipid Panel    Component Value Date/Time   CHOL 167 04/21/2013 0530   TRIG 106 04/21/2013 0530   HDL 44 04/21/2013 0530   CHOLHDL 3.8 04/21/2013 0530   VLDL 21 04/21/2013 0530   LDLCALC 102 (H) 04/21/2013 0530    Physical Exam:    VS:  BP (!) 108/58   Pulse 76   Ht 5' 3.5" (1.613 m)   Wt 212 lb 6.4 oz (96.3 kg)   SpO2 96%   BMI 37.03 kg/m     Wt Readings from Last 3 Encounters:  01/19/18 212 lb 6.4 oz (96.3 kg)  10/14/17 215 lb (97.5 kg)  06/21/17 213 lb (96.6 kg)     GEN:  Well nourished, well developed in no acute distress HEENT: Normal NECK: No JVD; No carotid bruits LYMPHATICS: No lymphadenopathy CARDIAC: RRR, no murmurs, rubs, gallops RESPIRATORY:  Clear to auscultation without rales, wheezing or rhonchi  ABDOMEN: Soft, non-tender, non-distended MUSCULOSKELETAL:  No edema; No deformity  SKIN: Warm and dry NEUROLOGIC:  Alert and oriented x 3 PSYCHIATRIC:  Normal affect   ASSESSMENT:    1. Coronary artery disease involving native coronary artery of native heart without angina pectoris   2. Essential hypertension, benign   3. PAT (paroxysmal atrial tachycardia) ([place])   4. Hyperlipidemia LDL goal <70    PLAN:    In order of problems listed above:  1.  ASCAD - coronary CTA showing a calcium score of 364 and one vessel CAD with moderate disease of the ostial and prox LAD and moderate - severe stenosis of the mid LAD.  She subsequently underwent cath showing 50-70% mid LAD proximal to the D1 and 75-80% mid OM with luminal irregularities in the mid RCA with normal LVF.  Medical therapy was recommended first and if continues to have angina or ischemia in anterior  wall then would consider PCI of the OM.  It was felt that the LAD would be higher risk due to significant calcification and angulation.  As this was not an ACS, PCI was not performed.  She has not had any  Chest pain but  has significant DOE with minimal exertional activity.  Difficult to determine if this is an anginal equivalent or related to her severe depression with sedentary state as well as arthritic pain. She will continue on ASA 81mg  daily and BB. I have recommended a lexiscan myoview since she has known CAD.  2.  HTN - BP is controlled on exam today but is on the soft side and she has had problems recently with SBP dropping into the 80-90's.  This may be the etiology of some of her fatigue.  I have recommended decreasing Losartan HCT to 50-12.5mg  daily.  She will check her BP daily for a week and call with the results..  She will continue on Bystolic 2.5mg  daily  Her creatinine was 0.9 and K+ 4.4 on 10/27/2017.  3.  PAT - s/p remote ablation.  She has not had any further episodes of palpitations.  She will continue on bystolic 2.5mg  daily.    4.  Hyperlipidemia - LDL goal was < 70.  Her LDL was 117 on 10/27/2017.  She is off statin due to severe arthritic pain.  I will refer her to lipid clinic for evaluation for PCSK9i but may be cost prohibitive.    Medication Adjustments/Labs and Tests Ordered: Current medicines are reviewed at length with the patient today.  Concerns regarding medicines are outlined above.  No orders of the defined types were placed in this encounter.  No orders of the defined types were placed in this encounter.   Signed, Fransico Him, MD  01/19/2018 10:56 AM    [place]

## 2018-01-19 NOTE — Patient Instructions (Signed)
Medication Instructions:   Decrease: Losartan/ HCTZ 50-12.5 mg, day, by mouth  If you need a refill on your cardiac medications before your next appointment, please call your pharmacy.   Lab work:  If you have labs (blood work) drawn today and your tests are completely normal, you will receive your results only by: Marland Kitchen MyChart Message (if you have MyChart) OR . A paper copy in the mail If you have any lab test that is abnormal or we need to change your treatment, we will call you to review the results.  Testing/Procedures: Your physician has requested that you have a lexiscan myoview. For further information please visit [email or web address]. Please follow instruction sheet, as given.  Follow-Up: At [facility], you and your health needs are our priority.  As part of our continuing mission to provide you with exceptional heart care, we have created designated Provider Care Teams.  These Care Teams include your primary Cardiologist (physician) and Advanced Practice Providers (APPs -  Physician Assistants and Nurse Practitioners) who all work together to provide you with the care you need, when you need it. You will need a follow up appointment in 1 years.  Please call our office 2 months in advance to schedule this appointment.  You may see Fransico Him, MD or one of the following Advanced Practice Providers on your designated Care Team:   [place], PA-C Melina Copa, PA-C . Ermalinda Barrios, PA-C  Any Other Special Instructions Will Be Listed Below (If Applicable).  Blood pressure readings for 1 week, call our office with the results. Take the blood pressure around the same time in the morning 1-2 hrs after taking your medications.

## 2018-01-23 ENCOUNTER — Telehealth (HOSPITAL_COMMUNITY): Payer: Self-pay

## 2018-01-23 ENCOUNTER — Ambulatory Visit (INDEPENDENT_AMBULATORY_CARE_PROVIDER_SITE_OTHER): Payer: Medicare Other | Admitting: Licensed Clinical Social Worker

## 2018-01-23 DIAGNOSIS — F3130 Bipolar disorder, current episode depressed, mild or moderate severity, unspecified: Secondary | ICD-10-CM

## 2018-01-23 NOTE — Progress Notes (Signed)
   THERAPIST PROGRESS NOTE  Session Time: 2pm-3:20pm  Participation Level: Active  Behavioral Response: Fairly GroomedAlertDepressed  Type of Therapy: Group Therapy  Treatment Goals addressed: Coping  Interventions: CBT  Summary: [name] is a 72 y.o. female who presents with Bipolar 1 disorder with reported increased symptoms of depression. Client verbalized after group a decrease in ADLs, passive SI, trouble sleeping, which she reports is abnormal for her. Client verbalized ongoing frustrating living situation however has not made plans or followed through with plans to move. Client verbalized being angry that the person she is living with has said they would not take care of her. Client showed empathy toward group member sharing about the decision she made to keep herself healthy over having contact with her biological family. Client declined higher level of care internally but did verbalize wanting a medication change and plans to take herself to the hospital if her mood did not improve.   Suicidal/Homicidal: Nowithout intent/plan  Therapist Response: Clinician met with group and reviewed CBT triangle and connection of thoughts, emotions, and behaviors. Clinician provided communication handout with focus on effective communication skills to help cope with situations. Clinician inquired why group members did or did not want to ask questions that could result in uncomfortable situations/responses. Clinician encouraged clients to specifically identify thoughts and feelings and what the benefit of leaving them unresolved. Clinician encouraged clients to be mindful of the possibility of other perspectives and managing expectations of others based only on our own behaviors and thoughts without previously expressing our needs.   Plan: Return again in 1 weeks.; client considering voluntary hospitalization at [facility] due to passive SI and reported decrease in depressive  symptoms.  Diagnosis: Axis I: Bipolar, Depressed    Olegario Messier, LCSW 01/23/2018

## 2018-01-23 NOTE — Telephone Encounter (Signed)
Patient came in to see therapist, she is not doing well and is thinking of going to [place] to check herself in. Patient is wanting to know if she can go back on Prozac. She does not have an appointment until January. Please review and advise, thank you

## 2018-01-24 ENCOUNTER — Telehealth: Payer: Self-pay

## 2018-01-24 DIAGNOSIS — E785 Hyperlipidemia, unspecified: Secondary | ICD-10-CM

## 2018-01-24 NOTE — Telephone Encounter (Signed)
Spoke with the patient she expressed understanding and accepted the lipid clinic referral.

## 2018-01-24 NOTE — Telephone Encounter (Signed)
-----   Message from Sueanne Margarita, MD sent at 01/19/2018  2:51 PM EDT ----- Please refer to lipid clinic since she is off statin therapy due to severe joint pain.

## 2018-01-25 ENCOUNTER — Other Ambulatory Visit: Payer: Self-pay | Admitting: Cardiology

## 2018-01-25 MED ORDER — NEBIVOLOL HCL 2.5 MG PO TABS: 2.5000 mg | ORAL_TABLET | Freq: Every day | ORAL | 3 refills | Status: DC

## 2018-01-26 ENCOUNTER — Telehealth: Payer: Self-pay | Admitting: Cardiology

## 2018-01-26 NOTE — Telephone Encounter (Signed)
Left message to call office

## 2018-01-26 NOTE — Telephone Encounter (Signed)
Called patient to make her the appointment with the [facility].   Per patient she is not understanding why she need this appointment.   Please call to advise.

## 2018-01-27 ENCOUNTER — Other Ambulatory Visit: Payer: Self-pay | Admitting: Pulmonary Disease

## 2018-01-27 NOTE — Telephone Encounter (Signed)
lpmtcb 1/18 

## 2018-01-30 ENCOUNTER — Ambulatory Visit (INDEPENDENT_AMBULATORY_CARE_PROVIDER_SITE_OTHER): Payer: Medicare Other | Admitting: Licensed Clinical Social Worker

## 2018-01-30 DIAGNOSIS — F3130 Bipolar disorder, current episode depressed, mild or moderate severity, unspecified: Secondary | ICD-10-CM

## 2018-01-30 NOTE — Telephone Encounter (Signed)
PT AWARE OF THE NEED TO MAKE LIPID CLINIC APPT. PER PT WAS LYING DOWN WILL CALL BACK LATER AND MAKE AN APPT .Adonis Housekeeper

## 2018-01-31 ENCOUNTER — Ambulatory Visit: Payer: Self-pay

## 2018-01-31 NOTE — Progress Notes (Signed)
   THERAPIST PROGRESS NOTE  Session Time: 2-3pm  Participation Level: Active  Behavioral Response: Fairly GroomedAlertAnxious and Depressed  Type of Therapy: Group Therapy  Treatment Goals addressed: Coping  Interventions: CBT  Summary: [name] is a 72 y.o. female who presents with symptoms of depression and anxiety. Client reports she has improved since last group noting improved sleep, mood, and coping. Client participated in skills and discussion adding she finds mindfulness skills helpful. Client was able to identify times she could implement these skills. Client noted she often does have passive-aggressive communication styles depending on the situation.   Suicidal/Homicidal: Nowithout intent/plan  Therapist Response:  The purpose of this group is to utilize CBT skills in a group setting to increase use of healthy coping skills and decrease active mental health symptoms.  Clinician checked in with group members assessing for SI/HI/psychosis and overall level of functioning. Clinician and group members discussed the purpose of group and clinician inquired about specific needs of clients. Clinician inquired about clients recent use of any skills and the effects on relationships. Clinician praised clients attempted use of skills. Clinician and clients processed the frustration of others having different values and expectations not being met. Clinician presented 5 senses skill and utilizing mindfulness in situations to manage levels of anxiety and frustration when clients have a lack of control. Clinician and group members discussed situations where this could be helpful. Clients commented on types of communication styles, which will be a topic for next group in addition to mindfulness relating to eating as an unhelpful coping skill or unhealthy habit.    Plan: Return again in 1-2 weeks.  Diagnosis: Axis I: Bipolar, mixed      Micheline Chapman, LCSW 01/30/2018

## 2018-02-01 ENCOUNTER — Ambulatory Visit: Payer: Self-pay | Admitting: Pulmonary Disease

## 2018-02-01 ENCOUNTER — Ambulatory Visit: Payer: Self-pay

## 2018-02-01 ENCOUNTER — Encounter: Payer: Self-pay | Admitting: Cardiology

## 2018-02-06 ENCOUNTER — Telehealth: Payer: Self-pay

## 2018-02-06 ENCOUNTER — Ambulatory Visit (HOSPITAL_COMMUNITY): Payer: Medicare Other | Admitting: Licensed Clinical Social Worker

## 2018-02-06 NOTE — Telephone Encounter (Signed)
New message    Just an FYI.  We have made several attempts to contact this patient including sending a letter to schedule or reschedule their MYOCARDIAL PERFUSION. We will be removing the patient from the WQ.   Thank you 

## 2018-02-07 NOTE — Telephone Encounter (Signed)
Patient was deciding if she wanted to proceed due to personal concerns. Advised if she wanted to go through with the test to call the office.

## 2018-02-10 ENCOUNTER — Telehealth: Payer: Self-pay | Admitting: *Deleted

## 2018-02-10 NOTE — Telephone Encounter (Signed)
Patient calling crying. States she is having increased pain in her joints, back and legs. Is taking ibuprofen and it is not helping. States her PCP dr Leighton Ruff suggests she see dr Alen Blew before her regularly scheduled appt for April 2020

## 2018-02-10 NOTE — Telephone Encounter (Signed)
Her pain is unrelated to her diagnosis. Will get her for an earlier appointment than April however.

## 2018-02-11 ENCOUNTER — Other Ambulatory Visit (HOSPITAL_COMMUNITY): Payer: Self-pay | Admitting: Psychiatry

## 2018-02-11 DIAGNOSIS — F3130 Bipolar disorder, current episode depressed, mild or moderate severity, unspecified: Secondary | ICD-10-CM

## 2018-02-13 ENCOUNTER — Ambulatory Visit (INDEPENDENT_AMBULATORY_CARE_PROVIDER_SITE_OTHER): Payer: Medicare Other | Admitting: Licensed Clinical Social Worker

## 2018-02-13 ENCOUNTER — Ambulatory Visit: Payer: Self-pay | Admitting: Adult Health

## 2018-02-13 ENCOUNTER — Telehealth: Payer: Self-pay | Admitting: Oncology

## 2018-02-13 DIAGNOSIS — F3130 Bipolar disorder, current episode depressed, mild or moderate severity, unspecified: Secondary | ICD-10-CM | POA: Diagnosis not present

## 2018-02-13 NOTE — Telephone Encounter (Signed)
Called pt and left message for patient to call back to set up follow up appt at a sooner date.

## 2018-02-13 NOTE — Progress Notes (Signed)
   THERAPIST PROGRESS NOTE  Session Time: 2pm-3pm  Participation Level: Active  Behavioral Response: CasualAlertDepressed  Type of Therapy: Group Therapy  Treatment Goals addressed: Coping  Interventions: CBT and Supportive  Summary: [name] is a 72 y.o. female who presents with Bipolar I Disorder, most recent episode depressed. Client verbalizes ongoing concerns with living situation and was provided information for care coordination through [place] due to reported [employer]. Client is planning on seeing friends for the holiday and having a game night with friends which she hopes to enjoy being a part of something. Client reports improvement on snacking and throwing out unhealthy snacks to avoid temptation.   Suicidal/Homicidal: Nowithout intent/plan  Therapist Response: The purpose of this group is to utilize CBT and DBT skills in a group setting to increase knowledge of healthy coping skills and decrease intensity of active mental health symptoms. Clinician checked in with group members assessing for SI/HI/psychosis. Clinician inquired about recent use of skills or increase in life stressors. Clinician and group members processed unmet needs being the basis behind some uncomfortable emotions clients have verbalized. Clinician and group members reviewed BASICS, a guideline of healthy eating process, as clients previously identified over-snacking as an unhealthy coping skill. Clinician and group members discussed how to add mindfulness into eating including identifying hunger vs other uncomfortable feelings, focusing attention and 5 senses on eating, assessing levels of satisfaction throughout meal.    Plan: Return again in 1-2 weeks.  Diagnosis: Axis I: Bipolar, Depressed    Olegario Messier, LCSW 02/13/2018

## 2018-02-14 ENCOUNTER — Other Ambulatory Visit (HOSPITAL_COMMUNITY): Payer: Self-pay

## 2018-02-14 MED ORDER — DIVALPROEX SODIUM ER 250 MG PO TB24: 750.0000 mg | ORAL_TABLET | Freq: Every day | ORAL | 0 refills | Status: DC

## 2018-02-15 ENCOUNTER — Telehealth (HOSPITAL_COMMUNITY): Payer: Self-pay

## 2018-02-15 NOTE — Telephone Encounter (Signed)
Patient was called and offered an appointment on 12/5 with Dr. Doyne Keel, patient stated she is not coming in that early and does not know why the doctor did not call herself. Patient stated she will wait for her appointment in January.

## 2018-02-15 NOTE — Telephone Encounter (Signed)
Dr. Doyne Keel called me after speaking with the patients PCP. Patient was trying to get in to see her PCP to start on Prozac. I had discussed with the patient yesterday about her coming in to the Saturday clinic on 12/14, however patient did not want to wait that long. Dr. Doyne Keel told me to have the front desk call patient and put her on the schedule for 12/5. I will pass that message along. [facility] which [facility] a much better program for her.

## 2018-02-20 ENCOUNTER — Ambulatory Visit (INDEPENDENT_AMBULATORY_CARE_PROVIDER_SITE_OTHER): Payer: Medicare Other | Admitting: Licensed Clinical Social Worker

## 2018-02-20 DIAGNOSIS — F3130 Bipolar disorder, current episode depressed, mild or moderate severity, unspecified: Secondary | ICD-10-CM

## 2018-02-20 NOTE — Progress Notes (Signed)
   THERAPIST PROGRESS NOTE  Session Time: 2pm-3pm  Participation Level: Active  Behavioral Response: CasualAlertEuthymic  Type of Therapy: Group Therapy  Treatment Goals addressed: Coping  Interventions: CBT, Motivational Interviewing and Supportive  Summary: [name] is a 72 y.o. female who presents with Bipolar I disorder. Client reports having a good thanksgiving meal with friends and attended a religious service which improved her outlook. Client states she made a 'contract' with the man with whom she is living to set expectations. Client has been staying with a friend for several nights rather than sleeping in her car while waiting for her own bedroom at the house. Client spoke with primary doctor prescribing prednesone and reports will be stopping this due to the mood swings. Client was able to empathize with group member and provide other perspectives. Client was open to feedback from group member and therapist.  Suicidal/Homicidal: Nowithout intent/plan  Therapist Response: The purpose of this group is to utilize CBT and DBT skills in a group setting to increase use of healthy coping skills and decrease intensity of active mental health symptoms. Clinician checked in with client assessing for SI/HI/psychosis. Clinician inquired about stressors over the holiday and skills attempted and the effectiveness. Clinician presented the topic of Anger and Emotional Regulation. Clinician and group members discussed emotions which could be expressed as anger and why this could be. Clinician provided reflective statements and utilize socratic questioning to encourage clients to identify specific thoughts related to triggering events. Clinician validated client feelings. Clinician presented Emotional Regulation Skills of Opposite Action and Check the Facts.   Plan: Return again in 1-2 weeks.  Diagnosis: Axis I: Bipolar, Depressed    Olegario Messier, LCSW 02/20/2018

## 2018-02-22 ENCOUNTER — Telehealth: Payer: Self-pay | Admitting: Oncology

## 2018-02-22 NOTE — Telephone Encounter (Signed)
Call ed patient per 12/4 sch message - left message for patient to call back to r/s

## 2018-02-27 ENCOUNTER — Ambulatory Visit (INDEPENDENT_AMBULATORY_CARE_PROVIDER_SITE_OTHER): Payer: Medicare Other | Admitting: Licensed Clinical Social Worker

## 2018-02-27 DIAGNOSIS — F3130 Bipolar disorder, current episode depressed, mild or moderate severity, unspecified: Secondary | ICD-10-CM | POA: Diagnosis not present

## 2018-02-27 NOTE — Progress Notes (Signed)
   THERAPIST PROGRESS NOTE  Session Time: 2-3pm  Participation Level: Active  Behavioral Response: Fairly GroomedAlertDepressed and Irritable  Type of Therapy: Group Therapy  Treatment Goals addressed: Coping  Interventions: CBT, Motivational Interviewing and Supportive  Summary: [name] is a 72 y.o. female who presents with Bipolar 1 Disorder. Client reports she is proud of herself for not being hospitalized recently as she reports she is often more depressed in the winter. Client has decorated for the holiday and is proud of being productive. Client remains frustrated with housing and legal situations however was able to problem solve when prompted.  Suicidal/Homicidal: Nowithout intent/plan  Therapist Response: Clinician met with group assessing for SI/HI/psychosis/substance use, and overall level of functioning. Clinician inquired about skills used and increased stressors in the past week. Clinician facilitated discussion on getting needs met and how unmet needs affects our mood and behaviors. Clinician presented clients with skills reminder Life SAVERS, reviewing mindfulness, meditation, visualization, exercise, reading, and journaling. Clinician challenged group members to add an action of self care into their week, focusing on finding positive experiences even on difficult days. Homework: complete Positive Self-Talk Journal.  Plan: Return again in 2 weeks.  Diagnosis: Axis I: Bipolar, Depressed   Olegario Messier, LCSW 02/27/2018

## 2018-03-03 ENCOUNTER — Telehealth: Payer: Self-pay | Admitting: Oncology

## 2018-03-03 NOTE — Telephone Encounter (Signed)
Returned call to patient re message left requesting appointment with Dr. Alen Blew due to pain. Patient upset re calling multiple time. Schedule message sent 12/4, however scheduling has not been able to make contact with patient. Not able to reach patient today or leave message (voicemail full). Will call again Monday, if patient still cannot be reached schedule/letter will be mailed.

## 2018-03-06 ENCOUNTER — Ambulatory Visit (INDEPENDENT_AMBULATORY_CARE_PROVIDER_SITE_OTHER): Payer: Medicare Other | Admitting: Licensed Clinical Social Worker

## 2018-03-06 DIAGNOSIS — F3130 Bipolar disorder, current episode depressed, mild or moderate severity, unspecified: Secondary | ICD-10-CM | POA: Diagnosis not present

## 2018-03-06 NOTE — Progress Notes (Signed)
   THERAPIST PROGRESS NOTE  Session Time: 2pm-3pm  Participation Level: Active  Behavioral Response: CasualAlertEuthymic  Type of Therapy: Individual Therapy  Treatment Goals addressed: Coping  Interventions: CBT, Motivational Interviewing, Supportive and Reframing  Summary: [name] is a 72 y.o. female who presents with Bipolar I Disorder, most recent episode depressed.   Suicidal/Homicidal: Nowithout intent/plan  Therapist Response: Clinician met with client assessing for SI/HI/psychosis and overall level of functioning. Clinician inquired about skills used over the past week. Client shared feeling overwhelmed over the weekend having to take her roommate/partner to the ER for a broken leg. Client reports she has been forgetting to take care of herself some as evidence by being unsure if medications have been taken every day. Clinician encouraged client to get a weekly pill holder to help manage medications. Clinician and client also discussed the importance of eating and sleeping regularly to help maintain good mental health. Client reports improved sleep over the past 2-3 days. Client notes she has been overall less tearful and is proud of herself for handling stressors and not requiring hospitalization. Clinician and client processed the importance of human need for connection and clients desire to be the love of someone's life or finding the love of her life rather than other connections. Clinician and client discussed not waiting for motivation before starting an activity she would enjoy, such as making jewelery, but planning it into her day as a task.  Plan: Return again in 1-2 weeks.  Diagnosis: Axis I: Bipolar, Depressed    Olegario Messier, LCSW 03/06/2018

## 2018-03-07 NOTE — Telephone Encounter (Signed)
Spoke with patient today re f/u with FS 12/19 @ 4 pm. See 12/4 schedule message also.

## 2018-03-09 ENCOUNTER — Telehealth: Payer: Self-pay | Admitting: Oncology

## 2018-03-09 ENCOUNTER — Inpatient Hospital Stay: Payer: Medicare Other | Attending: Oncology | Admitting: Oncology

## 2018-03-09 VITALS — BP 124/76 | HR 65 | Temp 98.1°F | Resp 17 | Ht 63.5 in | Wt 216.9 lb

## 2018-03-09 DIAGNOSIS — M791 Myalgia, unspecified site: Secondary | ICD-10-CM | POA: Insufficient documentation

## 2018-03-09 DIAGNOSIS — N2889 Other specified disorders of kidney and ureter: Secondary | ICD-10-CM | POA: Diagnosis not present

## 2018-03-09 DIAGNOSIS — M2550 Pain in unspecified joint: Secondary | ICD-10-CM | POA: Insufficient documentation

## 2018-03-09 NOTE — Telephone Encounter (Signed)
Scheduled appt per 12/19 los - gave patient AVS and calender per los.   

## 2018-03-09 NOTE — Progress Notes (Signed)
Hematology and Oncology Follow Up Visit  [name] [id number] May 26, 1945 72 y.o. 03/09/2018 4:13 PM  CC: [name] ([name]) [name], M.D.    Principle Diagnosis: 72 year old woman with right kidney mass detected in December 2011.  She refused work-up at that time and has not had a biopsy or surgical intervention.    Current therapy: Active surveillance.  Interim History:  [name] returns today for a repeat evaluation.  Since the last visit, she has been experiencing diffuse arthralgias and myalgias which have been worked up without any clear etiology.  She was evaluated by orthopedics as well as rheumatology without any definitive diagnosis.  She still ambulatory and functional for the most part without any decline in ability to do so.  She denies any flank pain or hematuria.  Her mood remains relatively stable.  She does not report any headaches, blurry vision, syncope or seizures.  She denies any alteration mental status or confusion.  She had not had any fevers or chills or sweats.  He denies any chest pain, palpitation, orthopnea or leg edema.  He denies any cough, wheezing or hemoptysis.  She denies any nausea, vomiting or abdominal distention.  She denied a change in bowel habits.  She does not report any bleeding or clotting tendency.  She denies any skin irritation or lesions.  She denies any frequency urgency or hesitancy.  She denied heat or cold intolerance.  Rest of her review of systems is negative.  Medications: I have reviewed the patient's current medications. Current Outpatient Medications  Medication Sig Dispense Refill  . acetaminophen (TYLENOL) 500 MG tablet Take 500 mg by mouth every 6 (six) hours as needed (joint pain).     Marland Kitchen acetaminophen (TYLENOL) 650 MG CR tablet Take 650 mg by mouth every 8 (eight) hours as needed for pain.    Marland Kitchen aspirin 81 MG EC tablet Take 1 tablet (81 mg total) by mouth daily. For heart health 30 tablet 1  . Cholecalciferol (VITAMIN D PO) Take  8,000 Units by mouth daily.    Marland Kitchen dimenhyDRINATE (DRAMAMINE) 50 MG tablet Take 25 mg by mouth every 8 (eight) hours as needed for nausea or dizziness.     . diphenhydrAMINE (BENADRYL) 25 MG tablet Take 25 mg by mouth 2 (two) times daily as needed for allergies.    Marland Kitchen divalproex (DEPAKOTE ER) 250 MG 24 hr tablet Take 3 tablets (750 mg total) by mouth daily. 90 tablet 0  . levothyroxine (SYNTHROID, LEVOTHROID) 88 MCG tablet Take 1 tablet (88 mcg total) by mouth daily before breakfast. For low thyroid function    . LORazepam (ATIVAN) 1 MG tablet Take 1 tablet (1 mg total) by mouth 2 (two) times daily as needed for anxiety. 60 tablet 1  . losartan-hydrochlorothiazide (HYZAAR) 50-12.5 MG tablet Take 1 tablet by mouth daily. 90 tablet 3  . Magnesium 500 MG TABS Take 500 mg by mouth daily.    . nebivolol (BYSTOLIC) 2.5 MG tablet Take 1 tablet (2.5 mg total) by mouth daily. 90 tablet 3  . OVER THE COUNTER MEDICATION CBD Oil-1 dropperfull twice daily    . rOPINIRole (REQUIP) 0.5 MG tablet Take 0.5 mg by mouth at bedtime as needed (For restless leg and sleep).     No current facility-administered medications for this visit.     Allergies:  Allergies  Allergen Reactions  . Geodon [Ziprasidone Hydrochloride] Other (See Comments)    Extremely agitated  . Lithium Nausea Only and Other (See Comments)  Off balance, increased heart rate  . Talwin [Pentazocine] Other (See Comments)    Hallucinations   . Toradol [Ketorolac Tromethamine] Other (See Comments)    Chest pains  . Ziprasidone Other (See Comments)    Extremely agitated  . Abilify [Aripiprazole] Other (See Comments)    jerking  . Compazine [Prochlorperazine Edisylate] Nausea And Vomiting  . Latuda [Lurasidone Hcl] Other (See Comments)    Reports made her mind race more and irritable   . Cbd [facility] Sal-Camph] Other (See Comments)    Tightness in chest when used.  . Pristiq [Desvenlafaxine Succinate Er] Other (See Comments)     Did not work, prefers not to take  . Rexulti [Brexpiprazole] Swelling    Elevated BP, created aggression  . Diflucan [Fluconazole] Nausea And Vomiting   Social history, family history and surgical history are reviewed and remain unchanged.   Physical Exam: Blood pressure 124/76, pulse 65, temperature 98.1 F (36.7 C), temperature source Oral, resp. rate 17, height 5' 3.5" (1.613 m), weight 216 lb 14.4 oz (98.4 kg), SpO2 99 %.   ECOG: 1   General appearance: Comfortable appearing without any discomfort Head: Normocephalic without any trauma Oropharynx: Mucous membranes are moist and pink without any thrush or ulcers. Eyes: Pupils are equal and round reactive to light. Lymph nodes: No cervical, supraclavicular, inguinal or axillary lymphadenopathy.   Heart:regular rate and rhythm.  S1 and S2 without leg edema. Lung: Clear without any rhonchi or wheezes.  No dullness to percussion. Abdomin: Soft, nontender, nondistended with good bowel sounds.  No hepatosplenomegaly. Musculoskeletal: No joint deformity or effusion.  Full range of motion noted. Neurological: No deficits noted on motor, sensory and deep tendon reflex exam. Skin: No petechial rash or dryness.  Appeared moist.  Psychiatric: Mood and affect appeared appropriate.      Lab Results: Lab Results  Component Value Date   WBC 4.7 10/14/2017   HGB 11.5 (L) 10/14/2017   HCT 35.6 (L) 10/14/2017   MCV 86.8 10/14/2017   PLT 231 10/14/2017     Chemistry      Component Value Date/Time   NA 143 10/14/2017 1147   NA 144 05/11/2016 1450   NA 139 02/18/2015 1440   K 3.9 10/14/2017 1147   K 4.2 02/18/2015 1440   CL 107 10/14/2017 1147   CL 103 07/21/2012 1415   CO2 31 10/14/2017 1147   CO2 29 02/18/2015 1440   BUN 21 10/14/2017 1147   BUN 21 05/11/2016 1450   BUN 19.1 02/18/2015 1440   CREATININE 0.87 10/14/2017 1147   CREATININE 0.9 02/18/2015 1440      Component Value Date/Time   CALCIUM 9.5 10/14/2017 1147    CALCIUM 10.2 02/18/2015 1440   ALKPHOS 64 10/14/2017 1147   ALKPHOS 89 02/18/2015 1440   AST 20 10/14/2017 1147   AST 20 02/18/2015 1440   ALT 15 10/14/2017 1147   ALT 16 02/18/2015 1440   BILITOT 0.7 10/14/2017 1147   BILITOT 0.37 02/18/2015 1440       Other findings: None.  IMPRESSION: 1. Solid right renal mass compatible with chronic renal cell carcinoma is grossly stable since the 2017 CT Abdomen and Pelvis. 2. Chronic cholelithiasis without evidence of acute cholecystitis or biliary obstruction. 3. No acute findings identified in the abdomen   72 year old woman with:  1.  Right renal mass suspicious for neoplasm noted in 2011.  She presented with a mass on imaging studies but refused work-up including biopsy or definitive treatment.  She has declined biopsy as well in the past.  The differential diagnosis was reviewed again as well as treatment options were reiterated.  His options would include continued surveillance as we have done in the past versus obtaining tissue biopsy versus primary surgical therapy.  Her last imaging study obtained in 2019 showed her tumor has not really changed compared to 2017.  In all likelihood we are dealing with indolent neoplasm and unlikely causing any of her symptoms.  After discussion today, she is interested now and being evaluated for possible surgical resection.  I will obtain ultrasound of this mass to assess the size and make the appropriate referral for urology to evaluate.  2.  Arthralgias and myalgias: Unrelated to her kidney mass.  I doubt this is a paraneoplastic phenomenon at this time.  3.  Follow-up: We will be in a month sooner if needed to  15  minutes was spent with the patient face-to-face today.  More than 50% of time was dedicated to reviewing imaging studies, natural course of her disease and management options.    Zola Button 12/19/20194:13 PM

## 2018-03-13 ENCOUNTER — Ambulatory Visit (INDEPENDENT_AMBULATORY_CARE_PROVIDER_SITE_OTHER): Payer: Medicare Other | Admitting: Licensed Clinical Social Worker

## 2018-03-13 DIAGNOSIS — F3130 Bipolar disorder, current episode depressed, mild or moderate severity, unspecified: Secondary | ICD-10-CM

## 2018-03-13 NOTE — Progress Notes (Signed)
   THERAPIST PROGRESS NOTE  Session Time: 2:10pm-2:55pm  Participation Level: Active  Behavioral Response: CasualAlertDysphoric  Type of Therapy: Individual Therapy  Treatment Goals addressed: Coping  Interventions: CBT, Motivational Interviewing and Supportive  Summary: SYNTHIA FAIRBANK is a 72 y.o. female who presents with Bipolar Disorder. Client verbalized ongoing frustration with housing situation. Client states she decreased her Depakote dose to 500 mg as she feels this dose is still effective and she feels better on this. Client states she did not talk to her prescribing psychiatrist or nurse before making this change.   Suicidal/Homicidal: Yeswithout intent/plan. Client verbalized passive thoughts of being disappointed the cancer is not killing her.  Therapist Response: Clinician met with client, assessing for SI/HI/psychosis/substance use and overall level of functioning. Client reports therapy is somewhat social for her and does not acknowledge progress made. Clinician inquired about specific goals for therapy and client shared about changing medications on her own, and passive SI. Client was unable to identify a specific therapy goal. Client believes her ADHD is effecting her ability to focus and complete tasks around the home. Clinician reminded client of previously learned and used skills however client does not identify this as progress.  Plan: Return again in 1-2 weeks.  Diagnosis: Axis I: Bipolar, Depressed    Olegario Messier, LCSW 03/13/2018

## 2018-03-20 ENCOUNTER — Ambulatory Visit (INDEPENDENT_AMBULATORY_CARE_PROVIDER_SITE_OTHER): Payer: Medicare Other | Admitting: Licensed Clinical Social Worker

## 2018-03-20 DIAGNOSIS — F3130 Bipolar disorder, current episode depressed, mild or moderate severity, unspecified: Secondary | ICD-10-CM

## 2018-03-20 NOTE — Progress Notes (Signed)
   THERAPIST PROGRESS NOTE  Session Time: 2pm-3pm  Participation Level: Active  Behavioral Response: CasualAlertEuthymic  Type of Therapy: Group Therapy  Treatment Goals addressed: Coping  Interventions: CBT, Motivational Interviewing and Supportive  Summary: [name] is a 72 y.o. female who presents with Bipolar I disorder, most recent episode depressed. Client endorses improved mood from last session. Client identifies intension as finding hope in all situations.   Suicidal/Homicidal: Nowithout intent/plan  Therapist Response: The purpose of this group is to utilize CBT skills in a group setting to increase use of healthy coping skills and decrease intensity of mental health symptoms. Clinician presented topic of identifying needs and healthy ways to get needs met. Clinician and group discussed types of physical, emotional, cognitive, and social needs. Clinician and group member processed differences when need are or are not met and brainstormed ways to address. Clinician and group discussed setting intensions for the year, rather than specific goals in order to help direct actions and decision making. Clinician utilize active listening skills including clarifying questions and summarizing to validate client feelings. Clinician assessed for SI/HI/pscyhosis and overall level of functioning.   Plan: Return again in 1 weeks.  Diagnosis: Axis I: Bipolar, Depressed     Olegario Messier, LCSW 03/20/2018

## 2018-03-23 ENCOUNTER — Ambulatory Visit: Payer: Self-pay | Admitting: Pharmacist

## 2018-03-23 NOTE — Progress Notes (Deleted)
Patient ID: [name]                 DOB: 1945/08/11                    MRN: [id number]     HPI: [name] is a 73 y.o. female patient referred to lipid clinic by Dr Radford Pax. PMH is significant for CAD, PVCs, diastolic dysfunction, bipolar disease, PAT s/p ablation, and HTN. Pt with calcium score of 364 and 1 vessel CAD with moderate disease of the ostial and prox LAD and moderate - severe stenosis of the mid LAD. Subsequent cath showed 50-70% mid LAD proximal to the D1 and 75-80% mid OM. Pt is not on statin therapy due to arthritic pain and presents to discuss PCSK9i therapy.  Off statin due to severe arthritic pain Any issues on atorvastatin? Try crestor 10  Current Medications: none Intolerances: atorvastatin 40mg  daily Risk Factors: CAD, elevated calcium score, HTN, age LDL goal: 70mg /dL  Diet:   Exercise: Minimal due to severe arthritis.  Family History: Alcohol abuse in her father; Allergies in her brother, daughter, father, and sister; Bipolar disorder in her father; CAD in her mother; Cancer in her paternal grandfather; Colon cancer in her paternal grandmother; Depression in her mother; Heart disease in her maternal grandfather and mother; Hypertension in her mother; OCD in her paternal grandmother; Skin cancer in her father.  Social History: Denies tobacco use, 1 glass of wine per week, no illicit drug use.  Labs: 10/27/17: TC 181, TG 99, HDL 45, LDL 117 (no therapy)  Past Medical History:  Diagnosis Date  . Abscess of Bartholin's gland   . Acute vestibular neuronitis    Dr Lucia Gaskins  . ADHD (attention deficit hyperactivity disorder)   . Adverse effect of general anesthetic    felt paralyzed while receiving anesthesia  . Anxiety   . Bipolar 1 disorder ([place])   . CAD (coronary artery disease), native coronary artery    cath 05/2016 showing 50-70% stenosis in the mid LAD proximal to the first diagonal and 70-80% small OM1. FFR of LAD  not performed because of  difficulty with catheter control from the right radial.   . Chronic kidney disease    kidney cancer- pt states she has elected to not have it treated.  . Depression    Bipolar disorder/goes to [facility] for meds  . Difficult intubation    told by [facility] that she was hard to intubate 15b yrs ago in [place]- surgery since then no problems  . GERD (gastroesophageal reflux disease)   . H/O echocardiogram 07/2012   [place] LVF w grade I siastolic dysfunction   . History of attention deficit disorder   . History of endometriosis   . History of pleural effusion   . Hyperlipidemia LDL goal <70 06/24/2016  . Hypertension   . 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 Memory difficulty 01/26/2017  . Migraines   . Obesity   . OSA (obstructive sleep apnea)    uses oral appliance instead of CPAP  . PAT (paroxysmal atrial tachycardia) (HCC)    s/p ablation  . PONV (postoperative nausea and vomiting)   . PTSD (post-traumatic stress disorder)   . PVC's (premature ventricular contractions)   . Renal mass 02/15/2012  . RLS (restless legs syndrome)    Dr Gwenette Greet  . rt renal ca dx'd 12/2009   no treatment/ no surg  . Vertigo  Current Outpatient Medications on File Prior to Visit  Medication Sig Dispense Refill  . acetaminophen (TYLENOL) 500 MG tablet Take 500 mg by mouth every 6 (six) hours as needed (joint pain).     Marland Kitchen acetaminophen (TYLENOL) 650 MG CR tablet Take 650 mg by mouth every 8 (eight) hours as needed for pain.    Marland Kitchen aspirin 81 MG EC tablet Take 1 tablet (81 mg total) by mouth daily. For heart health 30 tablet 1  . Cholecalciferol (VITAMIN D PO) Take 8,000 Units by mouth daily.    Marland Kitchen dimenhyDRINATE (DRAMAMINE) 50 MG tablet Take 25 mg by mouth every 8 (eight) hours as needed for nausea or dizziness.     . diphenhydrAMINE (BENADRYL) 25 MG tablet Take 25 mg by mouth 2 (two) times daily as needed for allergies.    Marland Kitchen divalproex (DEPAKOTE ER) 250 MG 24 hr tablet  Take 3 tablets (750 mg total) by mouth daily. 90 tablet 0  . levothyroxine (SYNTHROID, LEVOTHROID) 88 MCG tablet Take 1 tablet (88 mcg total) by mouth daily before breakfast. For low thyroid function    . LORazepam (ATIVAN) 1 MG tablet Take 1 tablet (1 mg total) by mouth 2 (two) times daily as needed for anxiety. 60 tablet 1  . losartan-hydrochlorothiazide (HYZAAR) 50-12.5 MG tablet Take 1 tablet by mouth daily. 90 tablet 3  . Magnesium 500 MG TABS Take 500 mg by mouth daily.    . nebivolol (BYSTOLIC) 2.5 MG tablet Take 1 tablet (2.5 mg total) by mouth daily. 90 tablet 3  . OVER THE COUNTER MEDICATION CBD Oil-1 dropperfull twice daily    . rOPINIRole (REQUIP) 0.5 MG tablet Take 0.5 mg by mouth at bedtime as needed (For restless leg and sleep).     No current facility-administered medications on file prior to visit.     Allergies  Allergen Reactions  . Geodon [Ziprasidone Hydrochloride] Other (See Comments)    Extremely agitated  . Lithium Nausea Only and Other (See Comments)    Off balance, increased heart rate  . Talwin [Pentazocine] Other (See Comments)    Hallucinations   . Toradol [Ketorolac Tromethamine] Other (See Comments)    Chest pains  . Ziprasidone Other (See Comments)    Extremely agitated  . Abilify [Aripiprazole] Other (See Comments)    jerking  . Compazine [Prochlorperazine Edisylate] Nausea And Vomiting  . Latuda [Lurasidone Hcl] Other (See Comments)    Reports made her mind race more and irritable   . Cbd [facility] Sal-Camph] Other (See Comments)    Tightness in chest when used.  . Pristiq [Desvenlafaxine Succinate Er] Other (See Comments)    Did not work, prefers not to take  . Rexulti [Brexpiprazole] Swelling    Elevated BP, created aggression  . Diflucan [Fluconazole] Nausea And Vomiting    Assessment/Plan:  1. Hyperlipidemia -

## 2018-03-24 ENCOUNTER — Other Ambulatory Visit: Payer: Self-pay | Admitting: Oncology

## 2018-03-24 ENCOUNTER — Telehealth: Payer: Self-pay | Admitting: *Deleted

## 2018-03-24 ENCOUNTER — Ambulatory Visit (HOSPITAL_COMMUNITY)
Admission: RE | Admit: 2018-03-24 | Discharge: 2018-03-24 | Disposition: A | Discharge status: Home / Self Care | Payer: Medicare Other | Source: Ambulatory Visit | Attending: Oncology | Admitting: Oncology

## 2018-03-24 DIAGNOSIS — N2889 Other specified disorders of kidney and ureter: Secondary | ICD-10-CM

## 2018-03-24 NOTE — Telephone Encounter (Signed)
Spoke with patient, per dr Alen Blew her kidney mass grew slightly and he recommends her to see a urologist. She is in agreement with this. Dr Alen Blew stated he would make the referral

## 2018-03-24 NOTE — Telephone Encounter (Signed)
-----   Message from Wyatt Portela, MD sent at 03/24/2018  2:42 PM EST ----- Please let her know that her kidney mass grew slightly. I recommend to see a urologist like we discussed. I can make the referral if she agrees.

## 2018-03-31 ENCOUNTER — Telehealth: Payer: Self-pay

## 2018-03-31 NOTE — Telephone Encounter (Signed)
Received call from patient asking about the urologist referral as she has not heard anything. This RN contacted [facility] regarding referral from Dr. Alen Blew and was informed that the patient needs to call billing at [facility] to proceed with the referral. This RN contacted patient with above instructions and contact [phone] for [facility] and to call [facility] with any other questions or concerns.

## 2018-04-03 ENCOUNTER — Ambulatory Visit (HOSPITAL_COMMUNITY): Payer: Medicare Other | Admitting: Licensed Clinical Social Worker

## 2018-04-06 ENCOUNTER — Ambulatory Visit (INDEPENDENT_AMBULATORY_CARE_PROVIDER_SITE_OTHER): Payer: Medicare Other | Admitting: Psychiatry

## 2018-04-06 ENCOUNTER — Encounter (HOSPITAL_COMMUNITY): Payer: Self-pay | Admitting: Psychiatry

## 2018-04-06 ENCOUNTER — Telehealth: Payer: Self-pay | Admitting: *Deleted

## 2018-04-06 VITALS — BP 116/78 | HR 73 | Ht 63.5 in | Wt 213.0 lb

## 2018-04-06 DIAGNOSIS — F411 Generalized anxiety disorder: Secondary | ICD-10-CM

## 2018-04-06 DIAGNOSIS — F3130 Bipolar disorder, current episode depressed, mild or moderate severity, unspecified: Secondary | ICD-10-CM | POA: Diagnosis not present

## 2018-04-06 DIAGNOSIS — F4310 Post-traumatic stress disorder, unspecified: Secondary | ICD-10-CM

## 2018-04-06 MED ORDER — DIVALPROEX SODIUM ER 500 MG PO TB24: 500.0000 mg | ORAL_TABLET | Freq: Every day | ORAL | 2 refills | Status: DC

## 2018-04-06 MED ORDER — DULOXETINE HCL 30 MG PO CPEP: 30.0000 mg | ORAL_CAPSULE | Freq: Every day | ORAL | 2 refills | Status: DC

## 2018-04-06 MED ORDER — LORAZEPAM 1 MG PO TABS: 1.0000 mg | ORAL_TABLET | Freq: Two times a day (BID) | ORAL | 2 refills | Status: DC | PRN

## 2018-04-06 NOTE — Telephone Encounter (Signed)
Spoke with patient. Encouraged her to call [facility] and schedule an appt. U/S report from 03/24/2018 was faxed to judy gentle in medical records @ [facility].
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
## 2018-04-10 ENCOUNTER — Other Ambulatory Visit: Payer: Self-pay | Admitting: Pulmonary Disease

## 2018-04-10 ENCOUNTER — Ambulatory Visit (INDEPENDENT_AMBULATORY_CARE_PROVIDER_SITE_OTHER): Payer: Medicare Other | Admitting: Licensed Clinical Social Worker

## 2018-04-10 DIAGNOSIS — F3130 Bipolar disorder, current episode depressed, mild or moderate severity, unspecified: Secondary | ICD-10-CM

## 2018-04-11 NOTE — Progress Notes (Signed)
   THERAPIST PROGRESS NOTE  Session Time: 2pm-2:45pm  Participation Level: Active  Behavioral Response: CasualAlertEuthymic  Type of Therapy: Individual Therapy  Treatment Goals addressed: Coping  Interventions: CBT, Motivational Interviewing and Supportive  Summary: [name] is a 73 y.o. female who presents with Bipolar disorder, most recent episode depressed, reporting stable per client. Client reports meeting with psychiatrist went well which surprised her. Client reports ongoing struggle setting boundaries to take care of herself while helping her housemate who needs more medical attention. Client was open to information about [facility]. Client reports continued frustration with attorney and previous housing and not wanting to pay money she does not believe is accurate. Client identifies this is an ongoing problem which she would like to resolve, but does not like the options she was given and feels unsupported.  Client reports she hs having more good days and is worried she is becoming manic however after reviewing symptoms of mania client decided she is just not used to having 'good' days. Client plans to buy a light box for seasonal depression and reports her psychiatrist agrees this is a good option. Client was open to types of coping skills and was able to identify which ones she currently tries, and others she is open to trying.  Suicidal/Homicidal: Nowithout intent/plan  Therapist Response: Clinician met with client, assessing for SI/HI/psychosis and overall level of functioning. Clinician inquired about overall mood, medication compliance, and recent stressors. Clinician and client discussed the differences in having a 'good' day vs being manic. Clinician encouraged continued problem solving for client related to help caregiving and moving. Clinician presented client with Coping Skills worksheet and discussed when different types of skills could be useful.  Clinician and client processed progress toward goals, and continued needs from therapy.   Plan: Return again in 2 weeks.  Diagnosis: Axis I: Bipolar, Depressed     Olegario Messier, LCSW 04/11/2018

## 2018-04-17 ENCOUNTER — Ambulatory Visit (HOSPITAL_COMMUNITY): Payer: Medicare Other | Admitting: Licensed Clinical Social Worker

## 2018-04-21 ENCOUNTER — Ambulatory Visit (INDEPENDENT_AMBULATORY_CARE_PROVIDER_SITE_OTHER): Payer: Medicare Other | Admitting: Pulmonary Disease

## 2018-04-21 ENCOUNTER — Encounter: Payer: Self-pay | Admitting: Pulmonary Disease

## 2018-04-21 DIAGNOSIS — G4733 Obstructive sleep apnea (adult) (pediatric): Secondary | ICD-10-CM | POA: Diagnosis not present

## 2018-04-21 DIAGNOSIS — G2581 Restless legs syndrome: Secondary | ICD-10-CM | POA: Diagnosis not present

## 2018-04-21 NOTE — Assessment & Plan Note (Signed)
We have tried CPAP on several occasions and she has tolerated this poorly.  She is also not tolerated dental appliance very well. We discussed the inspire device and at this time her BMI contraindicates this however if her BMI comes less than 32 then she could be a candidate. With her mild degree of OSA we probably do not have to worry about cardiovascular implications of OSA.  Regarding sleepiness, it seems that CPAP did not really help her symptoms that much  Weight loss encouraged Advised against medications with sedative side effects Cautioned against driving when sleepy - understanding that sleepiness will vary on a day to day basis

## 2018-04-21 NOTE — Progress Notes (Signed)
   Subjective:    Patient ID: [name], female    DOB: June 03, 1945, 73 y.o.   MRN: [id number]  HPI  73 yo woman for FU of restless leg syndrome andOSA  She has bipolar disorder and anxiety -on antidepressants for more than 30 years, now off 11/2016AHI was worse compared to 2004, she was started on CPAP of 9 cm but had poor compliance and this was discontinued by DME  She underwent repeat home sleep study 11/2016 which showed AHI of 12/hour and was started back on CPAP. She has usedDental appliance in the past but did not tolerate this well  She was tapered off Prozac and her restless leg symptoms  improved tremendously   Chief Complaint  Patient presents with  . Follow-up    Pt is not using cpap machine, not felt any change since using machine.    Unfortunately she has stopped using CPAP altogether, had trouble tolerating the mask.  She is tearful during her interview again today and seems like depression remains an ongoing problem. Restless legs have worsened again, had previously improved after stopping Prozac but now she is back on Cymbalta and feels like this is worsening her symptoms Weight is unchanged  Significant tests/ events reviewed  NPSG 2004: AHI 10/hr PSG 07/2015 (214 lbs) AHI 23/h, mild PLMs 23/h but no sig arousals  CPAP titration 09/2015 9 cm  HST 11/2016 AHI 12/h, 7h TST  12/2016 Spirometry - poor effort , ratio 68, FEV1 of 67% and FVC of 75% suggesting mild obstruction   Review of Systems neg for any significant sore throat, dysphagia, itching, sneezing, nasal congestion or excess/ purulent secretions, fever, chills, sweats, unintended wt loss, pleuritic or exertional cp, hempoptysis, orthopnea pnd or change in chronic leg swelling. Also denies presyncope, palpitations, heartburn, abdominal pain, nausea, vomiting, diarrhea or change in bowel or urinary habits, dysuria,hematuria, rash, arthralgias, visual complaints, headache, numbness  weakness or ataxia.     Objective:   Physical Exam   Gen. Pleasant, obese, in no distress ENT - no lesions, no post nasal drip Neck: No JVD, no thyromegaly, no carotid bruits Lungs: no use of accessory muscles, no dullness to percussion, decreased without rales or rhonchi  Cardiovascular: Rhythm regular, heart sounds  normal, no murmurs or gallops, no peripheral edema Musculoskeletal: No deformities, no cyanosis or clubbing , no tremors        Assessment & Plan:

## 2018-04-21 NOTE — Assessment & Plan Note (Signed)
Refill on ropinirole

## 2018-04-21 NOTE — Patient Instructions (Signed)
Aim for weight loss 10 lbs We discussed INSPIRE implant for OSA

## 2018-04-24 ENCOUNTER — Ambulatory Visit (INDEPENDENT_AMBULATORY_CARE_PROVIDER_SITE_OTHER): Payer: Medicare Other | Admitting: Licensed Clinical Social Worker

## 2018-04-24 DIAGNOSIS — F3130 Bipolar disorder, current episode depressed, mild or moderate severity, unspecified: Secondary | ICD-10-CM

## 2018-04-24 NOTE — Progress Notes (Signed)
   THERAPIST PROGRESS NOTE  Session Time: 2:20pm-2:50pm  Participation Level: Active  Behavioral Response: CasualAlertEuthymic  Type of Therapy: Individual Therapy  Treatment Goals addressed: Coping  Interventions: CBT, Motivational Interviewing and Supportive  Summary: [name] is a 73 y.o. female who presents with Bipolar I disorder, most recent episode depressed.   Suicidal/Homicidal: Nowithout intent/plan  Therapist Response: Clinician met with client, assessing for SI/HI/psychosis and overall level of functioning. Clinician addressed client being late to session and reminded client the importance of attending appointments on time. Clinician inquired about any skills used since last session to address reported feelings of irritability. Clinician reviewed Box Breathing and practiced in session. Clinician encouraged client to practice skill daily to gain mastery. Clinician problem solved with client options for addressing housing situation. Client notes she was able to use assertive communication and as her boyfriend for physical attention (cuddling) which improved her mood.  Plan: Return again in 2-4 weeks.  Diagnosis: Axis I: Bipolar, Depressed     Olegario Messier, LCSW 04/24/2018

## 2018-05-07 ENCOUNTER — Other Ambulatory Visit (HOSPITAL_COMMUNITY): Payer: Self-pay | Admitting: Psychiatry

## 2018-05-07 DIAGNOSIS — F3130 Bipolar disorder, current episode depressed, mild or moderate severity, unspecified: Secondary | ICD-10-CM

## 2018-05-07 DIAGNOSIS — F4310 Post-traumatic stress disorder, unspecified: Secondary | ICD-10-CM

## 2018-05-07 DIAGNOSIS — F411 Generalized anxiety disorder: Secondary | ICD-10-CM

## 2018-05-08 ENCOUNTER — Ambulatory Visit (HOSPITAL_COMMUNITY): Payer: Medicare Other | Admitting: Licensed Clinical Social Worker

## 2018-05-17 ENCOUNTER — Ambulatory Visit (INDEPENDENT_AMBULATORY_CARE_PROVIDER_SITE_OTHER): Payer: Medicare Other | Admitting: Licensed Clinical Social Worker

## 2018-05-17 DIAGNOSIS — F3130 Bipolar disorder, current episode depressed, mild or moderate severity, unspecified: Secondary | ICD-10-CM | POA: Diagnosis not present

## 2018-05-17 NOTE — Progress Notes (Signed)
   THERAPIST PROGRESS NOTE  Session Time: 2pm-2:45pm  Participation Level: Active  Behavioral Response: NeatAlertAnxious, Dysphoric and Labile affect  Type of Therapy: Individual Therapy face to face  Treatment Goals addressed: Coping and Diagnosis: Utilizing healthy coping skill at least 1xper day at least 4xper week  Interventions: CBT, Motivational Interviewing and Supportive  Summary: [name] is a 73 y.o. female who presents in person with Bipolar disorder, generalized Anxiety disorder, and PTSD. Client reports her light therapy box is not working. Most problematic symptoms verbalized include feeling sad more days than not, and low energy/motivation. Client endorses continued struggle with finding new housing however reports she has improved current living situation by hiring help around the home. Client shows progress toward goals as evidence by completing ADLs and being able to leave the home and participated in community support classes.  Suicidal/Homicidal: Passive within the past 24 hours, not currentlywithout intent/plan ; risky behaviors recently include drinking alcohol with oxycodone to "feel numb"  Therapist Response: Clinician checked in with client, assessing for SI/HI/psychosis and overall level of functioning. Clinician and client processed client's feeling of emptiness and disappointment with expectations of others, particularly sisters with whom she has limited contact. Clinician challenged client to identify which feelings she is avoiding coping with. Clinician utilized active listening, reflective statements, and socratic questioning to engage with client. Clinician encouraged taking medication as prescribed and limiting substance use.   Plan: Return again in 2-3 weeks.  Diagnosis: Axis I: Bipolar, Depressed    Axis II: Borderline Personality Dis.    Olegario Messier, LCSW 05/17/2018

## 2018-05-25 ENCOUNTER — Inpatient Hospital Stay (HOSPITAL_COMMUNITY)
Admission: RE | Admit: 2018-05-25 | Discharge: 2018-05-30 | DRG: 885 | Disposition: A | Discharge status: Home / Self Care | Payer: Medicare Other | Attending: Psychiatry | Admitting: Psychiatry

## 2018-05-25 ENCOUNTER — Encounter (HOSPITAL_COMMUNITY): Payer: Self-pay

## 2018-05-25 ENCOUNTER — Other Ambulatory Visit: Payer: Self-pay

## 2018-05-25 DIAGNOSIS — F3132 Bipolar disorder, current episode depressed, moderate: Secondary | ICD-10-CM | POA: Diagnosis not present

## 2018-05-25 DIAGNOSIS — F4310 Post-traumatic stress disorder, unspecified: Secondary | ICD-10-CM | POA: Diagnosis present

## 2018-05-25 DIAGNOSIS — I2510 Atherosclerotic heart disease of native coronary artery without angina pectoris: Secondary | ICD-10-CM | POA: Diagnosis present

## 2018-05-25 DIAGNOSIS — Z85850 Personal history of malignant neoplasm of thyroid: Secondary | ICD-10-CM | POA: Diagnosis not present

## 2018-05-25 DIAGNOSIS — R45851 Suicidal ideations: Secondary | ICD-10-CM | POA: Diagnosis present

## 2018-05-25 DIAGNOSIS — N189 Chronic kidney disease, unspecified: Secondary | ICD-10-CM | POA: Diagnosis present

## 2018-05-25 DIAGNOSIS — G4710 Hypersomnia, unspecified: Secondary | ICD-10-CM | POA: Diagnosis present

## 2018-05-25 DIAGNOSIS — Z811 Family history of alcohol abuse and dependence: Secondary | ICD-10-CM

## 2018-05-25 DIAGNOSIS — Z79899 Other long term (current) drug therapy: Secondary | ICD-10-CM

## 2018-05-25 DIAGNOSIS — G2581 Restless legs syndrome: Secondary | ICD-10-CM | POA: Diagnosis present

## 2018-05-25 DIAGNOSIS — Z23 Encounter for immunization: Secondary | ICD-10-CM | POA: Diagnosis present

## 2018-05-25 DIAGNOSIS — F3130 Bipolar disorder, current episode depressed, mild or moderate severity, unspecified: Principal | ICD-10-CM | POA: Diagnosis present

## 2018-05-25 DIAGNOSIS — Z818 Family history of other mental and behavioral disorders: Secondary | ICD-10-CM | POA: Diagnosis not present

## 2018-05-25 DIAGNOSIS — Z62810 Personal history of physical and sexual abuse in childhood: Secondary | ICD-10-CM | POA: Diagnosis present

## 2018-05-25 DIAGNOSIS — Z808 Family history of malignant neoplasm of other organs or systems: Secondary | ICD-10-CM

## 2018-05-25 DIAGNOSIS — Z85528 Personal history of other malignant neoplasm of kidney: Secondary | ICD-10-CM | POA: Diagnosis not present

## 2018-05-25 DIAGNOSIS — E669 Obesity, unspecified: Secondary | ICD-10-CM | POA: Diagnosis present

## 2018-05-25 DIAGNOSIS — F322 Major depressive disorder, single episode, severe without psychotic features: Secondary | ICD-10-CM | POA: Diagnosis not present

## 2018-05-25 DIAGNOSIS — I129 Hypertensive chronic kidney disease with stage 1 through stage 4 chronic kidney disease, or unspecified chronic kidney disease: Secondary | ICD-10-CM | POA: Diagnosis present

## 2018-05-25 DIAGNOSIS — E890 Postprocedural hypothyroidism: Secondary | ICD-10-CM | POA: Diagnosis present

## 2018-05-25 DIAGNOSIS — E785 Hyperlipidemia, unspecified: Secondary | ICD-10-CM | POA: Diagnosis present

## 2018-05-25 DIAGNOSIS — Z800 Family history of malignant neoplasm of digestive organs: Secondary | ICD-10-CM

## 2018-05-25 DIAGNOSIS — Z6837 Body mass index (BMI) 37.0-37.9, adult: Secondary | ICD-10-CM

## 2018-05-25 DIAGNOSIS — Z8249 Family history of ischemic heart disease and other diseases of the circulatory system: Secondary | ICD-10-CM | POA: Diagnosis not present

## 2018-05-25 DIAGNOSIS — G4733 Obstructive sleep apnea (adult) (pediatric): Secondary | ICD-10-CM | POA: Diagnosis present

## 2018-05-25 DIAGNOSIS — K219 Gastro-esophageal reflux disease without esophagitis: Secondary | ICD-10-CM | POA: Diagnosis present

## 2018-05-25 DIAGNOSIS — N2889 Other specified disorders of kidney and ureter: Secondary | ICD-10-CM | POA: Diagnosis present

## 2018-05-25 MED ORDER — QUETIAPINE FUMARATE 50 MG PO TABS
50.0000 mg | ORAL_TABLET | Freq: Every day | ORAL | Status: DC
  Filled 2018-05-25 (×3): qty 1

## 2018-05-25 MED ORDER — ACETAMINOPHEN 325 MG PO TABS
650.0000 mg | ORAL_TABLET | Freq: Four times a day (QID) | ORAL | Status: DC | PRN
  Administered 2018-05-27: 650 mg via ORAL
  Filled 2018-05-25: qty 2

## 2018-05-25 MED ORDER — HYDROXYZINE HCL 25 MG PO TABS
25.0000 mg | ORAL_TABLET | Freq: Four times a day (QID) | ORAL | Status: DC | PRN
  Administered 2018-05-26 (×2): 25 mg via ORAL
  Filled 2018-05-25 (×2): qty 1

## 2018-05-25 MED ORDER — INFLUENZA VAC SPLIT HIGH-DOSE 0.5 ML IM SUSY
0.5000 mL | PREFILLED_SYRINGE | INTRAMUSCULAR | Status: AC
  Administered 2018-05-26: 0.5 mL via INTRAMUSCULAR
  Filled 2018-05-25: qty 0.5

## 2018-05-25 MED ORDER — ALUM & MAG HYDROXIDE-SIMETH 200-200-20 MG/5ML PO SUSP: 30.0000 mL | ORAL | Status: DC | PRN

## 2018-05-25 MED ORDER — MAGNESIUM HYDROXIDE 400 MG/5ML PO SUSP: 30.0000 mL | Freq: Every day | ORAL | Status: DC | PRN

## 2018-05-25 NOTE — Tx Team (Signed)
Initial Treatment Plan 05/25/2018 10:24 PM [name] TKK:[id number]    PATIENT STRESSORS: Financial difficulties Marital or family conflict   PATIENT STRENGTHS: Curator fund of knowledge Special hobby/interest Supportive family/friends   PATIENT IDENTIFIED PROBLEMS: "Depression"  "At risk for suicide"   "Relationship issues"   "Financial issues"                DISCHARGE CRITERIA:  Ability to meet basic life and health needs Improved stabilization in mood, thinking, and/or behavior Verbal commitment to aftercare and medication compliance  PRELIMINARY DISCHARGE PLAN: Attend PHP/IOP Outpatient therapy Return to previous living arrangement  PATIENT/FAMILY INVOLVEMENT: This treatment plan has been presented to and reviewed with the patient, [name]. The patient have been given the opportunity to ask questions and make suggestions.  Lonia Skinner, RN 05/25/2018, 10:24 PM

## 2018-05-25 NOTE — H&P (Addendum)
Behavioral Health Medical Screening Exam  [name] is an 73 y.o. female with reported hx of bipolar d/o, presenting to [facility] depressed, tearfull and functionally deficient. She is endorsing passive SI  Total Time spent with patient: 20 minutes  Psychiatric Specialty Exam: Physical Exam  Constitutional: She is oriented to person, place, and time. She appears well-developed and well-nourished. No distress.  HENT:  Head: Normocephalic.  Eyes: Pupils are equal, round, and reactive to light.  Respiratory: Effort normal and breath sounds normal. No respiratory distress.  Neurological: She is alert and oriented to person, place, and time. No cranial nerve deficit.  Skin: Skin is warm and dry. She is not diaphoretic.    Review of Systems  Psychiatric/Behavioral: Positive for depression and suicidal ideas. Negative for hallucinations and substance abuse. The patient is nervous/anxious and has insomnia.   All other systems reviewed and are negative.   There were no vitals taken for this visit.There is no height or weight on file to calculate BMI.  General Appearance: Disheveled  Eye Contact:  Good  Speech:  Clear and Coherent  Volume:  Normal  Mood:  Depressed  Affect:  Congruent  Thought Process:  Goal Directed  Orientation:  Full (Time, Place, and Person)  Thought Content:  Logical  Suicidal Thoughts:  Yes.  without intent/plan  Homicidal Thoughts:  No  Memory:  Immediate;   Fair  Judgement:  Fair  Insight:  Fair  Psychomotor Activity:  Normal  Concentration: Concentration: Fair  Recall:  [employer] of Knowledge:Fair  Language: Fair  Akathisia:  Negative  Handed:  Right  AIMS (if indicated):     Assets:  Desire for Improvement  Sleep:       Musculoskeletal: Strength & Muscle Tone: within normal limits Gait & Station: normal Patient leans: N/A  There were no vitals taken for this visit.  Recommendations:  Based on my evaluation the patient does not appear to have  an emergency medical condition.  Laverle Hobby, PA-C 05/25/2018, 9:15 PM   Attesting to NP note
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
## 2018-05-25 NOTE — Progress Notes (Addendum)
Admission Note:   TANNA LOEFFLER is an 73 y.o. female accompanied by her fiance Shirlean Mylar.Pt says that her depression has been worsening over the last few weeks.  "I am as down as I can get."  "I have nothing to live for, no family."  Patient says she has been contemplating suicide, she currently has no plan. She has had one previous suicide attempt. Patient denies any HI/AVH. Denies any use of substances. Patient is tearful in recounting her financial problems. She currently lives with her fiance.Pt states she has no support system. Pt indicate finance as a burden. Pt states fiance is dealing with "emotional and neurological problems". She says she has been sleeping more than 14 hours in a day.Skin was assessed and found to be clear of any abnormal marks. Pt searched and no contraband found, POC and unit policies explained and understanding verbalized. Consents obtained. Food and fluids offered, and fluids accepted. Pt had no additional questions or concerns. Belongings in locker #42. High falls risk initiated.

## 2018-05-26 DIAGNOSIS — F3132 Bipolar disorder, current episode depressed, moderate: Secondary | ICD-10-CM

## 2018-05-26 LAB — HEPATIC FUNCTION PANEL
ALT: 12 U/L (ref 0–44)
AST: 15 U/L (ref 15–41)
Albumin: 3.6 g/dL (ref 3.5–5.0)
Alkaline Phosphatase: 70 U/L (ref 38–126)
Bilirubin, Direct: 0.1 mg/dL (ref 0.0–0.2)
Indirect Bilirubin: 0.3 mg/dL (ref 0.3–0.9)
Total Bilirubin: 0.4 mg/dL (ref 0.3–1.2)
Total Protein: 7.1 g/dL (ref 6.5–8.1)

## 2018-05-26 LAB — CBC
HCT: 40.7 % (ref 36.0–46.0)
Hemoglobin: 12.4 g/dL (ref 12.0–15.0)
MCH: 27.4 pg (ref 26.0–34.0)
MCHC: 30.5 g/dL (ref 30.0–36.0)
MCV: 89.8 fL (ref 80.0–100.0)
Platelets: 258 10*3/uL (ref 150–400)
RBC: 4.53 MIL/uL (ref 3.87–5.11)
RDW: 14.2 % (ref 11.5–15.5)
WBC: 5.4 10*3/uL (ref 4.0–10.5)
nRBC: 0 % (ref 0.0–0.2)

## 2018-05-26 LAB — RAPID URINE DRUG SCREEN, HOSP PERFORMED
Amphetamines: NOT DETECTED
Barbiturates: NOT DETECTED
Benzodiazepines: POSITIVE — AB
Cocaine: NOT DETECTED
Opiates: NOT DETECTED
Tetrahydrocannabinol: NOT DETECTED

## 2018-05-26 LAB — LIPID PANEL
Cholesterol: 177 mg/dL (ref 0–200)
HDL: 47 mg/dL (ref >40)
LDL Cholesterol: 116 mg/dL — ABNORMAL HIGH (ref 0–99)
Total CHOL/HDL Ratio: 3.8 RATIO
Triglycerides: 68 mg/dL (ref <150)
VLDL: 14 mg/dL (ref 0–40)

## 2018-05-26 LAB — COMPREHENSIVE METABOLIC PANEL
ALT: 11 U/L (ref 0–44)
AST: 16 U/L (ref 15–41)
Albumin: 3.6 g/dL (ref 3.5–5.0)
Alkaline Phosphatase: 70 U/L (ref 38–126)
Anion gap: 9 (ref 5–15)
BUN: 20 mg/dL (ref 8–23)
CO2: 29 mmol/L (ref 22–32)
Calcium: 9.5 mg/dL (ref 8.9–10.3)
Chloride: 101 mmol/L (ref 98–111)
Creatinine, Ser: 0.83 mg/dL (ref 0.44–1.00)
GFR calc Af Amer: >60 mL/min (ref >60)
GFR calc non Af Amer: >60 mL/min (ref >60)
Glucose, Bld: 88 mg/dL (ref 70–99)
Potassium: 3.5 mmol/L (ref 3.5–5.1)
Sodium: 139 mmol/L (ref 135–145)
Total Bilirubin: 0.5 mg/dL (ref 0.3–1.2)
Total Protein: 7 g/dL (ref 6.5–8.1)

## 2018-05-26 LAB — HEMOGLOBIN A1C
Hgb A1c MFr Bld: 5.5 % (ref 4.8–5.6)
Mean Plasma Glucose: 111.15 mg/dL

## 2018-05-26 LAB — TSH: TSH: 1.867 u[IU]/mL (ref 0.350–4.500)

## 2018-05-26 LAB — VALPROIC ACID LEVEL: Valproic Acid Lvl: 64 ug/mL (ref 50.0–100.0)

## 2018-05-26 MED ORDER — LOSARTAN POTASSIUM 50 MG PO TABS
50.0000 mg | ORAL_TABLET | Freq: Every day | ORAL | Status: DC
  Administered 2018-05-26 – 2018-05-30 (×5): 50 mg via ORAL
  Filled 2018-05-26 (×7): qty 1

## 2018-05-26 MED ORDER — NEBIVOLOL HCL 2.5 MG PO TABS
2.5000 mg | ORAL_TABLET | Freq: Every day | ORAL | Status: DC
  Administered 2018-05-26 – 2018-05-30 (×4): 2.5 mg via ORAL
  Filled 2018-05-26 (×7): qty 1

## 2018-05-26 MED ORDER — LORAZEPAM 0.5 MG PO TABS
0.5000 mg | ORAL_TABLET | Freq: Three times a day (TID) | ORAL | Status: DC
  Administered 2018-05-26 – 2018-05-27 (×4): 0.5 mg via ORAL
  Filled 2018-05-26 (×6): qty 1

## 2018-05-26 MED ORDER — MENTHOL (TOPICAL ANALGESIC) 2 % EX GEL
1.0000 "application " | Freq: Four times a day (QID) | CUTANEOUS | Status: DC | PRN
  Administered 2018-05-27: 1 via CUTANEOUS

## 2018-05-26 MED ORDER — DIVALPROEX SODIUM ER 500 MG PO TB24
500.0000 mg | ORAL_TABLET | Freq: Every day | ORAL | Status: DC
  Administered 2018-05-26 – 2018-05-30 (×5): 500 mg via ORAL
  Filled 2018-05-26 (×7): qty 1

## 2018-05-26 MED ORDER — LEVOTHYROXINE SODIUM 88 MCG PO TABS
88.0000 ug | ORAL_TABLET | Freq: Every day | ORAL | Status: DC
  Administered 2018-05-26 – 2018-05-30 (×5): 88 ug via ORAL
  Filled 2018-05-26 (×7): qty 1

## 2018-05-26 MED ORDER — QUETIAPINE FUMARATE 25 MG PO TABS: 25.0000 mg | ORAL_TABLET | Freq: Every day | ORAL | Status: DC

## 2018-05-26 MED ORDER — FLUOXETINE HCL 10 MG PO CAPS
10.0000 mg | ORAL_CAPSULE | Freq: Every day | ORAL | Status: DC
  Administered 2018-05-26 – 2018-05-28 (×3): 10 mg via ORAL
  Filled 2018-05-26 (×4): qty 1

## 2018-05-26 MED ORDER — HYDROCHLOROTHIAZIDE 12.5 MG PO CAPS
12.5000 mg | ORAL_CAPSULE | Freq: Every day | ORAL | Status: DC
  Administered 2018-05-27 – 2018-05-30 (×4): 12.5 mg via ORAL
  Filled 2018-05-26 (×6): qty 1

## 2018-05-26 MED ORDER — PAPAYA ENZYME PO CHEW
1.0000 | CHEWABLE_TABLET | Freq: Two times a day (BID) | ORAL | Status: DC | PRN
  Administered 2018-05-26 – 2018-05-30 (×2): 1 via ORAL

## 2018-05-26 MED ORDER — LEVOTHYROXINE SODIUM 88 MCG PO TABS
88.0000 ug | ORAL_TABLET | Freq: Every day | ORAL | Status: DC
  Filled 2018-05-26: qty 1
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
## 2018-05-26 NOTE — Progress Notes (Signed)
DAR NOTE: Pt present with flat affect and depressed mood in the unit. Pt had crying moments when talking about how her day was, pt stated she slept most of the day, been irritated because she did not take her usual medications as she usually do. Pt stated she does not have any support system and does not know where to go when discharge since her fiancee told her that he will not take care of her. Pt has been in the dayroom this evening interacting with peers, no problems observed. Pt denies physical pain, took all her  meds as scheduled. Pt's safety ensured with 15 minute and environmental checks. Pt currently denies SI/HI and A/V hallucinations. Pt verbally agrees to seek staff if SI/HI or A/VH occurs and to consult with staff before acting on these thoughts. Will continue POC.

## 2018-05-26 NOTE — Progress Notes (Addendum)
Pt presents with a flat affect and a depressed mood. Pt noted to have minimal interaction on the unit and spent most the morning and afternoon sleeping. Pt reported ongoing depression. Pt denies SI/HI. On approach, pt stated that she hadn't taken any of her scheduled medications. Writer explained to the pt that she would meet with the MD before starting her medications. Pt verbalized understanding. Pt then expressed that she hadn't taken her synthroid today and stated that she can't miss a does of her synthroid. Writer spoke with Dr. Parke Poisson and requested to modify the synthroid to start today at 5 pm. Per MD writer may modify synthroid to start today at 5 pm. Pt met with the MD and was started on medications.  Medications reviewed with pt. Medications administered as ordered per MD. Verbal support provided. Pt encouraged to attend groups. 15 minute checks performed for safety.    Pt compliant with treatment plan.
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
## 2018-05-26 NOTE — Tx Team (Signed)
Interdisciplinary Treatment and Diagnostic Plan Update  05/26/2018 Time of Session:  [name] MRN: [id number]  Principal Diagnosis: <principal problem not specified>  Secondary Diagnoses: Active Problems:   MDD (major depressive disorder), severe (HCC)   Current Medications:  Current Facility-Administered Medications  Medication Dose Route Frequency Provider Last Rate Last Dose  . acetaminophen (TYLENOL) tablet 650 mg  650 mg Oral Q6H PRN Laverle Hobby, PA-C      . alum & mag hydroxide-simeth (MAALOX/MYLANTA) 200-200-20 MG/5ML suspension 30 mL  30 mL Oral Q4H PRN Patriciaann Clan E, PA-C      . hydrOXYzine (ATARAX/VISTARIL) tablet 25 mg  25 mg Oral Q6H PRN Patriciaann Clan E, PA-C   25 mg at 05/26/18 0448  . Influenza vac split quadrivalent PF (FLUZONE HIGH-DOSE) injection 0.5 mL  0.5 mL Intramuscular Tomorrow-1000 Cobos, Fernando A, MD      . magnesium hydroxide (MILK OF MAGNESIA) suspension 30 mL  30 mL Oral Daily PRN Patriciaann Clan E, PA-C      . QUEtiapine (SEROQUEL) tablet 50 mg  50 mg Oral QHS Patriciaann Clan E, PA-C       PTA Medications: Medications Prior to Admission  Medication Sig Dispense Refill Last Dose  . acetaminophen (TYLENOL) 500 MG tablet Take 500 mg by mouth every 6 (six) hours as needed (joint pain).    Taking  . acetaminophen (TYLENOL) 650 MG CR tablet Take 650 mg by mouth every 8 (eight) hours as needed for pain.   Taking  . aspirin 81 MG EC tablet Take 1 tablet (81 mg total) by mouth daily. For heart health 30 tablet 1 Taking  . b complex vitamins tablet Take 1 tablet by mouth daily.   Taking  . Cholecalciferol (VITAMIN D PO) Take 8,000 Units by mouth daily.   Taking  . dimenhyDRINATE (DRAMAMINE) 50 MG tablet Take 25 mg by mouth every 8 (eight) hours as needed for nausea or dizziness.    Taking  . diphenhydrAMINE (BENADRYL) 25 MG tablet Take 25 mg by mouth 2 (two) times daily as needed for allergies.   Taking  . divalproex (DEPAKOTE ER) 500 MG 24 hr tablet  Take 1 tablet (500 mg total) by mouth daily. 30 tablet 2 Taking  . DULoxetine (CYMBALTA) 30 MG capsule Take 1 capsule (30 mg total) by mouth daily. 30 capsule 2 Taking  . levothyroxine (SYNTHROID, LEVOTHROID) 88 MCG tablet Take 1 tablet (88 mcg total) by mouth daily before breakfast. For low thyroid function   Taking  . LORazepam (ATIVAN) 1 MG tablet Take 1 tablet (1 mg total) by mouth 2 (two) times daily as needed for anxiety. 60 tablet 2 Taking  . losartan-hydrochlorothiazide (HYZAAR) 50-12.5 MG tablet Take 1 tablet by mouth daily. 90 tablet 3 Taking  . Magnesium 500 MG TABS Take 500 mg by mouth daily.   Taking  . nebivolol (BYSTOLIC) 2.5 MG tablet Take 1 tablet (2.5 mg total) by mouth daily. 90 tablet 3 Taking  . OVER THE COUNTER MEDICATION CBD Oil-1 dropperfull twice daily   Taking  . rOPINIRole (REQUIP) 0.5 MG tablet Take 0.5 mg by mouth at bedtime as needed (For restless leg and sleep).   Taking    Patient Stressors: Financial difficulties Marital or family conflict  Patient Strengths: Curator fund of knowledge Special hobby/interest Supportive family/friends  Treatment Modalities: Medication Management, Group therapy, Case management,  1 to 1 session with clinician, Psychoeducation, Recreational therapy.   Physician Treatment Plan for Primary Diagnosis: <principal problem  not specified> Long Term Goal(s):     Short Term Goals:    Medication Management: Evaluate patient's response, side effects, and tolerance of medication regimen.  Therapeutic Interventions: 1 to 1 sessions, Unit Group sessions and Medication administration.  Evaluation of Outcomes: Not Met  Physician Treatment Plan for Secondary Diagnosis: Active Problems:   MDD (major depressive disorder), severe ([name])  Long Term Goal(s):     Short Term Goals:       Medication Management: Evaluate patient's response, side effects, and tolerance of medication regimen.  Therapeutic Interventions:  1 to 1 sessions, Unit Group sessions and Medication administration.  Evaluation of Outcomes: Not Met   RN Treatment Plan for Primary Diagnosis: <principal problem not specified> Long Term Goal(s): Knowledge of disease and therapeutic regimen to maintain health will improve  Short Term Goals: Ability to participate in decision making will improve, Ability to verbalize feelings will improve, Ability to disclose and discuss suicidal ideas and Ability to identify and develop effective coping behaviors will improve  Medication Management: RN will administer medications as ordered by provider, will assess and evaluate patient's response and provide education to patient for prescribed medication. RN will report any adverse and/or side effects to prescribing provider.  Therapeutic Interventions: 1 on 1 counseling sessions, Psychoeducation, Medication administration, Evaluate responses to treatment, Monitor vital signs and CBGs as ordered, Perform/monitor CIWA, COWS, AIMS and Fall Risk screenings as ordered, Perform wound care treatments as ordered.  Evaluation of Outcomes: Not Met   LCSW Treatment Plan for Primary Diagnosis: <principal problem not specified> Long Term Goal(s): Safe transition to appropriate next level of care at discharge, Engage patient in therapeutic group addressing interpersonal concerns.  Short Term Goals: Engage patient in aftercare planning with referrals and resources  Therapeutic Interventions: Assess for all discharge needs, 1 to 1 time with Social worker, Explore available resources and support systems, Assess for adequacy in community support network, Educate family and significant other(s) on suicide prevention, Complete Psychosocial Assessment, Interpersonal group therapy.  Evaluation of Outcomes: Not Met   Progress in Treatment: Attending groups: No. New to unit  Participating in groups: No. Taking medication as prescribed: Yes. Toleration medication:  Yes. Family/Significant other contact made: No, will contact:  the patient's fiance Patient understands diagnosis: Yes. Discussing patient identified problems/goals with staff: Yes. Medical problems stabilized or resolved: Yes. Denies suicidal/homicidal ideation: Yes. Issues/concerns per patient self-inventory: No. Other:   New problem(s) identified: None  New Short Term/Long Term Goal(s): medication stabilization, elimination of SI thoughts, development of comprehensive mental wellness plan.    Patient Goals: Help with depression and suicidal thoughts    Discharge Plan or Barriers: Patient lives with her boyfriend/fiance in [place], [place]. [place] will continue to follow and assess for appropriate referrals and possible discharge planning.    Reason for Continuation of Hospitalization: Anxiety Depression Medication stabilization Suicidal ideation  Estimated Length of Stay: 05/31/2018  Attendees: Patient: 05/26/2018 8:37 AM  Physician: Dr. Neita Garnet, MD 05/26/2018 8:37 AM  Nursing: Sharl Ma.Viona Gilmore, RN 05/26/2018 8:37 AM  RN Care Manager: 05/26/2018 8:37 AM  Social Worker: Radonna Ricker, [place] 05/26/2018 8:37 AM  Recreational Therapist:  05/26/2018 8:37 AM  Other:  05/26/2018 8:37 AM  Other:  05/26/2018 8:37 AM  Other: 05/26/2018 8:37 AM    Scribe for Treatment Team: Marylee Floras, [place] 05/26/2018 8:37 AM
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
## 2018-05-27 DIAGNOSIS — F322 Major depressive disorder, single episode, severe without psychotic features: Secondary | ICD-10-CM

## 2018-05-27 LAB — PROLACTIN: Prolactin: 24.3 ng/mL — ABNORMAL HIGH (ref 4.8–23.3)

## 2018-05-27 NOTE — [facility] Group Notes (Signed)
Adult Psychoeducational Group Note  Date:  05/27/2018 Time:  9:04 AM  Group Topic/Focus:  Developing a Wellness Toolbox:   The focus of this group is to help patients develop a "wellness toolbox" with skills and strategies to promote recovery upon discharge.  Participation Level:  Active  Participation Quality:  Appropriate  Affect:  Appropriate  Cognitive:  Appropriate  Insight: Appropriate  Engagement in Group:  Engaged  Modes of Intervention:  Discussion  Additional Comments:  Pt attended and participated the morning wellness group.   Tyrell Antonio Khalis Hittle 05/27/2018, 9:04 AM

## 2018-05-27 NOTE — Progress Notes (Signed)
Pt participated actively in a group activity for wrap-up group.

## 2018-05-27 NOTE — Plan of Care (Signed)
  Problem: Activity: Goal: Interest or engagement in activities will improve Outcome: Progressing   Problem: Coping: Goal: Ability to verbalize frustrations and anger appropriately will improve Outcome: Progressing  D: Pt alert and oriented on the unit. Pt engaging with RN staff and other pts and participated during unit groups and activities. Pt is pleasant and cooperative. A: Education, support and encouragement provided, q15 minute safety checks remain in effect. Medications administered per MD orders. R: No reactions/side effects to medicine noted. Pt denies any concerns at this time, and verbally contracts for safety. Pt ambulating on the unit with no issues. Pt remains safe on and off the unit.
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
## 2018-05-27 NOTE — [facility] Group Notes (Signed)
[facility] Group Notes:  (Nursing)  Date:  05/27/2018  Time:  1:15 Type of Therapy:  Nurse Education  Participation Level:  Active  Participation Quality:  Appropriate  Affect:  Appropriate  Cognitive:  Appropriate  Insight:  Appropriate  Engagement in Group:  Engaged  Modes of Intervention:  Education  Summary of Progress/Problems: Identifying Needs/ Life Skills group [name] 05/27/2018, 1:55 PM

## 2018-05-27 NOTE — Progress Notes (Signed)
[facility] MD Progress Note  05/27/2018 11:23 AM [name]  MRN:  [id number] Subjective: Patient is seen and examined.  Patient is a 73 year old female with a past psychiatric history significant for bipolar disorder, posttraumatic stress disorder, coronary artery disease, hypertension, restless leg syndrome, renal cancer and thyroid cancer who was admitted on 05/26/2018 with suicidal ideation.  Objective: Patient is a 73 year old female with the above-stated past psychiatric history who is seen in follow-up.  Patient stated she is essentially unchanged.  She stated she felt slightly better, but really only started the Cymbalta yesterday.  She was previously on the Depakote and Ativan as an outpatient.  She had started Cymbalta recently, but stopped taking it secondary to ineffectiveness.  She denied any previous episodes of mania when taking antidepressant medicines, and stated that she had only been aware of some of her manic symptoms in the last year or so.  She denied suicidal ideation currently.  Her vital signs are stable, she is afebrile.  She slept 5.75 hours last night.  Review of her laboratories revealed a Depakote level of 64.  Normal liver function enzymes.  Normal CBC.  Drug screen was positive for benzodiazepines.  Hemoglobin A1c was 5.5.  Principal Problem: MDD (major depressive disorder), severe ([place]) Diagnosis: Principal Problem:   MDD (major depressive disorder), severe ([place]) Active Problems:   Moderate bipolar I disorder, most recent episode depressed ([place])  Total Time spent with patient: 15 minutes  Past Psychiatric History: See admission H&P  Past Medical History:  Past Medical History:  Diagnosis Date  . Abscess of Bartholin's gland   . Acute vestibular neuronitis    Dr Lucia Gaskins  . ADHD (attention deficit hyperactivity disorder)   . Adverse effect of general anesthetic    felt paralyzed while receiving anesthesia  . Anxiety   . Bipolar 1 disorder ([place])   . CAD (coronary  artery disease), native coronary artery    cath 05/2016 showing 50-70% stenosis in the mid LAD proximal to the first diagonal and 70-80% small OM1. FFR of LAD  not performed because of difficulty with catheter control from the right radial.   . Chronic kidney disease    kidney cancer- pt states she has elected to not have it treated.  . Depression    Bipolar disorder/goes to [place] [facility] for meds  . Difficult intubation    told by [facility] that she was hard to intubate 15b yrs ago in [place]- surgery since then no problems  . GERD (gastroesophageal reflux disease)   . H/O echocardiogram 07/2012   [place] LVF w grade I siastolic dysfunction   . History of attention deficit disorder   . History of endometriosis   . History of pleural effusion   . Hyperlipidemia LDL goal <70 06/24/2016  . Hypertension   . 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 Memory difficulty 01/26/2017  . Migraines   . Obesity   . OSA (obstructive sleep apnea)    uses oral appliance instead of CPAP  . PAT (paroxysmal atrial tachycardia) (HCC)    s/p ablation  . PONV (postoperative nausea and vomiting)   . PTSD (post-traumatic stress disorder)   . PVC's (premature ventricular contractions)   . Renal mass 02/15/2012  . RLS (restless legs syndrome)    Dr Gwenette Greet  . rt renal ca dx'd 12/2009   no treatment/ no surg  . Vertigo     Past Surgical History:  Procedure Laterality Date  . CARDIAC ELECTROPHYSIOLOGY  [place] AND ABLATION  2000s  . LEFT HEART CATH AND CORONARY ANGIOGRAPHY N/A 06/08/2016   Procedure: Left Heart Cath and Coronary Angiography;  Surgeon: Belva Crome, MD;  Location: [place] [facility];  Service: Cardiovascular;  Laterality: N/A;  . nasoseptal reconstruction  1990s  . THYROIDECTOMY  1990  . TONSILLECTOMY     as a child  . TUBAL LIGATION  1970s  . uterine mass removal  03/2012   was found to be benign   Family History:  Family History  Problem Relation Age of Onset  .  Allergies Father   . Skin cancer Father   . Bipolar disorder Father   . Alcohol abuse Father   . Heart disease Mother   . Depression Mother   . Hypertension Mother   . CAD Mother   . Colon cancer Paternal Grandmother   . OCD Paternal Grandmother   . Allergies Sister        multiple  . Allergies Brother        multiple  . Allergies Daughter   . Heart disease Maternal Grandfather   . Cancer Paternal Grandfather    Family Psychiatric  History: See admission H&P Social History:  Social History   Substance and Sexual Activity  Alcohol Use Not Currently  . Alcohol/week: 1.0 standard drinks  . Types: 1 Glasses of wine per week     Social History   Substance and Sexual Activity  Drug Use No    Social History   Socioeconomic History  . Marital status: Single    Spouse name: Not on file  . Number of children: 1  . Years of education: 59  . Highest education level: Not on file  Occupational History  . 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 Financial resource strain: Not on file  . Food insecurity:    Worry: Not on file    Inability: Not on file  . Transportation needs:    Medical: Not on file    Non-medical: Not on file  Tobacco Use  . Smoking status: Never Smoker  . Smokeless tobacco: Never Used  Substance and Sexual Activity  . Alcohol use: Not Currently    Alcohol/week: 1.0 standard drinks    Types: 1 Glasses of wine per week  . Drug use: No  . Sexual activity: Yes    Partners: Male    Birth control/protection: None  Lifestyle  . Physical activity:    Days per week: Not on file    Minutes per session: Not on file  . Stress: Not on file  Relationships  . Social connections:    Talks on phone: Not on file    Gets together: Not on file    Attends religious service: Not on file    Active member of club or organization: Not on file    Attends meetings of clubs or organizations: Not on file    Relationship status: Not on file   Other Topics Concern  . Not on file  Social History Narrative   Divorced and lives alone   Has children   Caffeine use: none   Right handed    Additional Social History:    Pain Medications: See PTA med list from [facility] outpatient Prescriptions: See [facility] outpatient med list Over the Counter: None History of alcohol / drug use?: No history of alcohol / drug abuse  Sleep: Fair  Appetite:  Fair  Current Medications: Current Facility-Administered Medications  Medication Dose Route Frequency Provider Last Rate Last Dose  . acetaminophen (TYLENOL) tablet 650 mg  650 mg Oral Q6H PRN Laverle Hobby, PA-C      . alum & mag hydroxide-simeth (MAALOX/MYLANTA) 200-200-20 MG/5ML suspension 30 mL  30 mL Oral Q4H PRN Laverle Hobby, PA-C      . divalproex (DEPAKOTE ER) 24 hr tablet 500 mg  500 mg Oral Daily Cobos, Myer Peer, MD   500 mg at 05/27/18 0745  . FLUoxetine (PROZAC) capsule 10 mg  10 mg Oral Daily Cobos, Fernando A, MD   10 mg at 05/27/18 0745  . hydrochlorothiazide (MICROZIDE) capsule 12.5 mg  12.5 mg Oral Daily Cobos, Fernando A, MD   12.5 mg at 05/27/18 0745  . hydrOXYzine (ATARAX/VISTARIL) tablet 25 mg  25 mg Oral Q6H PRN Laverle Hobby, PA-C   25 mg at 05/26/18 2135  . levothyroxine (SYNTHROID, LEVOTHROID) tablet 88 mcg  88 mcg Oral Q0600 Cobos, Myer Peer, MD   88 mcg at 05/27/18 5885  . LORazepam (ATIVAN) tablet 0.5 mg  0.5 mg Oral TID Cobos, Myer Peer, MD   0.5 mg at 05/27/18 0746  . losartan (COZAAR) tablet 50 mg  50 mg Oral Daily Cobos, Myer Peer, MD   50 mg at 05/27/18 0745  . magnesium hydroxide (MILK OF MAGNESIA) suspension 30 mL  30 mL Oral Daily PRN Laverle Hobby, PA-C      . Menthol (Topical Analgesic) 2 % GEL 1 application  1 application Apply externally Q6H PRN Hampton Abbot, MD   1 application at 02/77/41 0342  . nebivolol (BYSTOLIC) tablet 2.5 mg  2.5 mg Oral Daily Cobos, Myer Peer, MD   2.5 mg at 05/27/18 0745  . Papaya Enzyme CHEW  1 tablet  1 tablet Oral BID PRN Hampton Abbot, MD   1 tablet at 05/26/18 1810    Lab Results:  Results for orders placed or performed during the hospital encounter of 05/25/18 (from the past 48 hour(s))  Urine rapid drug screen (hosp performed)not at [facility]     Status: Abnormal   Collection Time: 05/26/18  6:00 AM  Result Value Ref Range   Opiates NONE DETECTED NONE DETECTED   Cocaine NONE DETECTED NONE DETECTED   Benzodiazepines POSITIVE (A) NONE DETECTED   Amphetamines NONE DETECTED NONE DETECTED   Tetrahydrocannabinol NONE DETECTED NONE DETECTED   Barbiturates NONE DETECTED NONE DETECTED    Comment: (NOTE) DRUG SCREEN FOR MEDICAL PURPOSES ONLY.  IF CONFIRMATION IS NEEDED FOR ANY PURPOSE, NOTIFY LAB WITHIN 5 DAYS. LOWEST DETECTABLE LIMITS FOR URINE DRUG SCREEN Drug Class                     Cutoff (ng/mL) Amphetamine and metabolites    1000 Barbiturate and metabolites    200 Benzodiazepine                 287 Tricyclics and metabolites     300 Opiates and metabolites        300 Cocaine and metabolites        300 THC                            50 Performed at [facility], [place] [address] [place], [place] [address]   CBC     Status: None   Collection Time: 05/26/18  6:25 AM  Result Value Ref Range   WBC 5.4 4.0 - 10.5 K/uL   RBC 4.53 3.87 - 5.11 MIL/uL   Hemoglobin 12.4 12.0 - 15.0 g/dL   HCT 40.7 36.0 - 46.0 %   MCV 89.8 80.0 - 100.0 fL   MCH 27.4 26.0 - 34.0 pg   MCHC 30.5 30.0 - 36.0 g/dL   RDW 14.2 11.5 - 15.5 %   Platelets 258 150 - 400 K/uL   nRBC 0.0 0.0 - 0.2 %    Comment: Performed at [facility], [place] [address], [place], [place] [address]  Comprehensive metabolic panel     Status: None   Collection Time: 05/26/18  6:25 AM  Result Value Ref Range   Sodium 139 135 - 145 mmol/L   Potassium 3.5 3.5 - 5.1 mmol/L   Chloride 101 98 - 111 mmol/L   CO2 29 22 - 32 mmol/L   Glucose, Bld 88 70 - 99 mg/dL   BUN 20 8 - 23 mg/dL    Creatinine, Ser 0.83 0.44 - 1.00 mg/dL   Calcium 9.5 8.9 - 10.3 mg/dL   Total Protein 7.0 6.5 - 8.1 g/dL   Albumin 3.6 3.5 - 5.0 g/dL   AST 16 15 - 41 U/L   ALT 11 0 - 44 U/L   Alkaline Phosphatase 70 38 - 126 U/L   Total Bilirubin 0.5 0.3 - 1.2 mg/dL   GFR calc non Af Amer >60 >60 mL/min   GFR calc Af Amer >60 >60 mL/min   Anion gap 9 5 - 15    Comment: Performed at [facility], [place] [address], [place], [place] [address]  Hemoglobin A1c     Status: None   Collection Time: 05/26/18  6:25 AM  Result Value Ref Range   Hgb A1c MFr Bld 5.5 4.8 - 5.6 %    Comment: (NOTE) Pre diabetes:          5.7%-6.4% Diabetes:              >6.4% Glycemic control for   <7.0% adults with diabetes    Mean Plasma Glucose 111.15 mg/dL    Comment: Performed at [place] [address] [place], [place] [address]  Lipid panel     Status: Abnormal   Collection Time: 05/26/18  6:25 AM  Result Value Ref Range   Cholesterol 177 0 - 200 mg/dL   Triglycerides 68 <150 mg/dL   HDL 47 >40 mg/dL   Total CHOL/HDL Ratio 3.8 RATIO   VLDL 14 0 - 40 mg/dL   LDL Cholesterol 116 (H) 0 - 99 mg/dL    Comment:        Total Cholesterol/HDL:CHD Risk Coronary Heart Disease Risk Table                     Men   Women  1/2 Average Risk   3.4   3.3  Average Risk       5.0   4.4  2 X Average Risk   9.6   7.1  3 X Average Risk  23.4   11.0        Use the calculated Patient Ratio above and the CHD Risk Table to determine the patient's CHD Risk.        ATP III CLASSIFICATION (LDL):  <100     mg/dL   Optimal  100-129  mg/dL   Near or Above  Optimal  130-159  mg/dL   Borderline  160-189  mg/dL   High  >190     mg/dL   Very High Performed at [place] [address], [place], [place] [address]   TSH     Status: None   Collection Time: 05/26/18  6:25 AM  Result Value Ref Range   TSH 1.867 0.350 - 4.500 uIU/mL    Comment: Performed by a 3rd  Generation assay with a functional sensitivity of <=0.01 uIU/mL. Performed at [facility], [place] [address] [place], [place] [address]   Hepatic function panel     Status: None   Collection Time: 05/26/18  6:25 AM  Result Value Ref Range   Total Protein 7.1 6.5 - 8.1 g/dL   Albumin 3.6 3.5 - 5.0 g/dL   AST 15 15 - 41 U/L   ALT 12 0 - 44 U/L   Alkaline Phosphatase 70 38 - 126 U/L   Total Bilirubin 0.4 0.3 - 1.2 mg/dL   Bilirubin, Direct 0.1 0.0 - 0.2 mg/dL   Indirect Bilirubin 0.3 0.3 - 0.9 mg/dL    Comment: Performed at [facility], [place] [address] [place], [place] [address]  Prolactin     Status: Abnormal   Collection Time: 05/26/18  6:25 AM  Result Value Ref Range   Prolactin 24.3 (H) 4.8 - 23.3 ng/mL    Comment: (NOTE) Performed At: [facility] [place], [place] [id number] Rush Farmer MD [id number]   Valproic acid level     Status: None   Collection Time: 05/26/18  6:25 AM  Result Value Ref Range   Valproic Acid Lvl 64 50.0 - 100.0 ug/mL    Comment: Performed at [facility], [place] [address] [place], [place] [address]    Blood Alcohol level:  Lab Results  Component Value Date   ETH <10 10/14/2017   ETH <10 94/49/6759    Metabolic Disorder Labs: Lab Results  Component Value Date   HGBA1C 5.5 05/26/2018   MPG 111.15 05/26/2018   Lab Results  Component Value Date   PROLACTIN 24.3 (H) 05/26/2018   Lab Results  Component Value Date   CHOL 177 05/26/2018   TRIG 68 05/26/2018   HDL 47 05/26/2018   CHOLHDL 3.8 05/26/2018   VLDL 14 05/26/2018   LDLCALC 116 (H) 05/26/2018   LDLCALC 102 (H) 04/21/2013    Physical Findings: AIMS: Facial and Oral Movements Muscles of Facial Expression: None, normal Lips and Perioral Area: None, normal Jaw: None, normal Tongue: None, normal,Extremity Movements Upper (arms, wrists, hands, fingers): None, normal Lower (legs, knees, ankles,  toes): None, normal, Trunk Movements Neck, shoulders, hips: None, normal, Overall Severity Severity of abnormal movements (highest score from questions above): None, normal Incapacitation due to abnormal movements: None, normal Patient's awareness of abnormal movements (rate only patient's report): No Awareness, Dental Status Current problems with teeth and/or dentures?: No Does patient usually wear dentures?: No  CIWA:    COWS:     Musculoskeletal: Strength & Muscle Tone: within normal limits Gait & Station: normal Patient leans: N/A  Psychiatric Specialty Exam: Physical Exam  Nursing note and vitals reviewed. Constitutional: She is oriented to person, place, and time. She appears well-developed and well-nourished.  HENT:  Head: Normocephalic and atraumatic.  Respiratory: Effort normal.  Neurological: She is alert and oriented to person, place, and time.    ROS  Blood pressure 125/79, pulse 79, temperature 98.2 F (36.8  C), temperature source Oral, resp. rate 16, height 5\' 3"  (1.6 m), weight 96.6 kg.Body mass index is 37.73 kg/m.  General Appearance: Disheveled  Eye Contact:  Fair  Speech:  Normal Rate  Volume:  Normal  Mood:  Anxious and Depressed  Affect:  Congruent  Thought Process:  Coherent and Descriptions of Associations: Intact  Orientation:  Full (Time, Place, and Person)  Thought Content:  Logical  Suicidal Thoughts:  Yes.  without intent/plan  Homicidal Thoughts:  No  Memory:  Immediate;   Fair Recent;   Fair Remote;   Fair  Judgement:  Intact  Insight:  Fair  Psychomotor Activity:  Psychomotor Retardation  Concentration:  Concentration: Fair and Attention Span: Fair  Recall:  [employer] of Knowledge:  Fair  Language:  Fair  Akathisia:  Negative  Handed:  Right  AIMS (if indicated):     Assets:  Communication Skills Desire for Improvement Resilience  ADL's:  Intact  Cognition:  WNL  Sleep:  Number of Hours: 5.75     Treatment Plan  Summary: Daily contact with patient to assess and evaluate symptoms and progress in treatment, Medication management and Plan : Patient is seen and examined.  Patient is a 73 year old female with the above-stated past psychiatric history who is seen in follow-up.  Patient stated she felt a bit better today.  She stated her suicidal thoughts are down, and her mood is better.  She stated her anxiety was better too.  She stated she felt like she had to get out of her home situation.  She really has only been on the fluoxetine for 1 day, and so we will continue this.  No change in her Depakote.  Her blood pressure is currently stable with the Bystolic and hydrochlorothiazide.  She remains on 88 mcg p.o. daily of Synthroid.  Her TSH on admission was 1.867.  Her drug screen was positive for benzodiazepines on admission. 1.  Continue Depakote ER 500 mg p.o. daily for mood stability. 2.  Continue fluoxetine 10 mg p.o. daily for depression and anxiety. 3.  Continue hydrochlorothiazide 12.5 mg p.o. daily for hypertension. 4.  Continue hydroxyzine 25 mg every 6 hours as needed anxiety. 5.  Continue levothyroxine 88 mcg p.o. daily for hypothyroidism. 6.  Continue lorazepam 0.5 mg p.o. 3 times daily for anxiety. 7.  Continue losartan 50 mg p.o. daily for hypertension. 8.  Continue Bystolic 2.5 mg p.o. daily for hypertension. 9.  Disposition planning-in progress.  Sharma Covert, MD 05/27/2018, 11:23 AM

## 2018-05-27 NOTE — BHH Group Notes (Signed)
LCSW Group Therapy Note  05/27/2018    10:00-11:00am   Type of Therapy and Topic:  Group Therapy: Early Messages Received About Anger  Participation Level:  Active   Description of Group:   In this group, patients shared and discussed the early messages received in their lives about anger through parental or other adult modeling, teaching, repression, punishment, violence, and more.  Participants identified how those childhood lessons influence even now how they usually or often react when angered.  The group discussed that anger is a secondary emotion and what may be the underlying emotional themes that come out through anger outbursts or that are ignored through anger suppression.  Finally, as a group there was a conversation about the workbook's quote that "There is nothing wrong with anger; it is just a sign something needs to change."     Therapeutic Goals: 1. Patients will identify one or more childhood message about anger that they received and how it was taught to them. 2. Patients will discuss how these childhood experiences have influenced and continue to influence their own expression or repression of anger even today. 3. Patients will explore possible primary emotions that tend to fuel their secondary emotion of anger. 4. Patients will learn that anger itself is normal and cannot be eliminated, and that healthier coping skills can assist with resolving conflict rather than worsening situations.  Summary of Patient Progress:  The patient shared that her childhood lessons about anger were learned from a father who was very angry and a mother who never showed anger out of fear of father hurting her.  As a result, she grew up afraid not only to show anger but actually afraid to feel it as well.  She stated she has Bipolar disorder and will go into rages to "let it out," stating that this includes throwing a lot of things.  She was insightful, asked a number of questions, took notes, and  seemed intent on learning better coping.  Therapeutic Modalities:   Cognitive Behavioral Therapy Motivation Interviewing  [name]  .

## 2018-05-28 MED ORDER — FLUOXETINE HCL 10 MG PO CAPS
10.0000 mg | ORAL_CAPSULE | Freq: Once | ORAL | Status: AC
  Administered 2018-05-28: 10 mg via ORAL
  Filled 2018-05-28 (×2): qty 1

## 2018-05-28 MED ORDER — FLUOXETINE HCL 20 MG PO CAPS
20.0000 mg | ORAL_CAPSULE | Freq: Every day | ORAL | Status: DC
  Administered 2018-05-29 – 2018-05-30 (×2): 20 mg via ORAL
  Filled 2018-05-28 (×4): qty 1

## 2018-05-28 MED ORDER — LORAZEPAM 0.5 MG PO TABS
0.5000 mg | ORAL_TABLET | Freq: Four times a day (QID) | ORAL | Status: DC | PRN
  Administered 2018-05-28 – 2018-05-30 (×6): 0.5 mg via ORAL
  Filled 2018-05-28 (×6): qty 1

## 2018-05-28 NOTE — Plan of Care (Signed)
  Problem: Activity: Goal: Interest or engagement in activities will improve Outcome: Progressing   Problem: Coping: Goal: Ability to verbalize frustrations and anger appropriately will improve Outcome: Progressing   D: Pt alert and oriented on the unit. Pt engaging with RN staff and other pts. Pt denies SI/HI, A/VH. Pt stated that her goal for today is "peace in my soul." Pt attended and participated during groups and unit activities. Pt is pleasant and cooperative. A: Education, support and encouragement provided, q15 minute safety checks remain in effect. Medications administered per MD orders. R: No reactions/side effects to medicine noted. Pt denies any concerns at this time, and verbally contracts for safety. Pt ambulating on the unit with no issues. Pt remains safe on and off the unit.

## 2018-05-28 NOTE — Progress Notes (Signed)
Adult Psychoeducational Group Note  Date:  05/28/2018 Time:  8:40 PM  Group Topic/Focus:  Wrap-Up Group:   The focus of this group is to help patients review their daily goal of treatment and discuss progress on daily workbooks.  Participation Level:  Did Not Attend  Participation Quality:  Did not attend  Affect:  Did not attend  Cognitive:  Did not attend  Insight: None  Engagement in Group:  Did not attend  Modes of Intervention:  Did not attend  Additional Comments:  Patient did not attend wrap up group this evening.  Fontella Shan L Ethan Kasperski 05/28/2018, 8:40 PM

## 2018-05-28 NOTE — Progress Notes (Signed)
D.  Pt pleasant on approach, no complaints voiced at this time.  Pt did not attend evening wrap up group, remained in room.  Pt states that she goes to bed early.  Pt observed visiting husband earlier and interacting appropriately with peers on the unit.  Pt denies SI/HI/AVH at this time.  A.  Support and encouragement offered, medication given as ordered  R. Pt remains safe on the unit, will continue to monitor.

## 2018-05-28 NOTE — BHH Group Notes (Signed)
[place] LCSW Group Therapy Note  05/28/2018   10:00-11:00AM  Type of Therapy and Topic:  Group Therapy:  Unhealthy versus Healthy Supports, Which Am I?  Participation Level:  Active   Description of Group:  Patients in this group were introduced to the concept that additional supports including self-support are an essential part of recovery.  Initially a discussion was held about the differences between healthy versus unhealthy supports.  Patients were asked to share what unhealthy supports in their lives need to be addressed, as well as what additional healthy supports could be added for greater help in reaching their goals.   A song entitled "My Own Hero" was played and a group discussion ensued in which patients stated they could relate to the song and it inspired them to realize they have be willing to help themselves in order to succeed, because other people cannot achieve sobriety or stability for them.  We discussed adding a variety of healthy supports to address the various needs in patient lives, including becoming more self-supportive.  Therapeutic Goals: 1)  Highlight the differences between healthy and unhealthy supports 2)  Suggest the importance of being a part of one's own support system 2)  Discuss reasons people in one's life may eventually be unable to be continually supportive  3)  Identify the patient's current support system and   4)  elicit commitments to add healthy supports and to become more conscious of being self-supportive   Summary of Patient Progress:  The patient expressed that the unhealthy support which needs to be addressed includes her fiance, stating she has to make a decision on whether to continue in the relationship or to find new housing.  He has what she called "brain damage" and said she cannot be his caretaker because she has too many needs herself.  Healthy supports which could be added for increased stability and happiness include improved support from her  brother and sister, actual support not just occasional communication.  She would like to receive better support from her church, and says that she goes to a group but often feels left out.  She wants to add [place] classes and support groups where she believes that she will make friends and be around others who have a lot in common with her.  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

## 2018-05-28 NOTE — Progress Notes (Signed)
[facility] MD Progress Note  05/28/2018 9:35 AM [name]  MRN:  [id number] Subjective:  Patient is a 73 year old female with a past psychiatric history significant for bipolar disorder, posttraumatic stress disorder, coronary artery disease, hypertension, restless leg syndrome, renal cancer and thyroid cancer who was admitted on 05/26/2018 with suicidal ideation.  Objective: Patient is a 73 year old female with the above-stated past psychiatric history seen in follow-up.  She states she is doing okay but still significantly fatigued.  She stated that she has had this fatigue worked up.  They suspect possible fibromyalgia or chronic fatigue syndrome.  She stated that the fatigue is been there for over a year.  She stated that her pulmonologist is spoken to her about going on stimulants.  We discussed whether or not she had sleep apnea, and she stated "I have the kind of which the machines do not really help".  We discussed the potential of increasing her fluoxetine to 20 mg p.o. daily.  Her Depakote level on admission was in the mid 60s.  She is on lorazepam 0.5 mg p.o. 3 times daily standing, and she stated that she took 0.5 mg p.o. twice daily as needed at home.  We discussed changing that back to PRN since she is improving.  She denied any suicidal ideation this morning.  Her vital signs are stable, she is afebrile.  She slept 5.75 hours last night.  Principal Problem: MDD (major depressive disorder), severe ([place]) Diagnosis: Principal Problem:   MDD (major depressive disorder), severe ([place]) Active Problems:   Moderate bipolar I disorder, most recent episode depressed ([place])  Total Time spent with patient: 15 minutes  Past Psychiatric History: See admission H&P  Past Medical History:  Past Medical History:  Diagnosis Date  . Abscess of Bartholin's gland   . Acute vestibular neuronitis    Dr Lucia Gaskins  . ADHD (attention deficit hyperactivity disorder)   . Adverse effect of general anesthetic     felt paralyzed while receiving anesthesia  . Anxiety   . Bipolar 1 disorder ([place])   . CAD (coronary artery disease), native coronary artery    cath 05/2016 showing 50-70% stenosis in the mid LAD proximal to the first diagonal and 70-80% small OM1. FFR of LAD  not performed because of difficulty with catheter control from the right radial.   . Chronic kidney disease    kidney cancer- pt states she has elected to not have it treated.  . Depression    Bipolar disorder/goes to [facility] for meds  . Difficult intubation    told by [facility] that she was hard to intubate 15b yrs ago in [place]- surgery since then no problems  . GERD (gastroesophageal reflux disease)   . H/O echocardiogram 07/2012   [place] LVF w grade I siastolic dysfunction   . History of attention deficit disorder   . History of endometriosis   . History of pleural effusion   . Hyperlipidemia LDL goal <70 06/24/2016  . Hypertension   . 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 Memory difficulty 01/26/2017  . Migraines   . Obesity   . OSA (obstructive sleep apnea)    uses oral appliance instead of CPAP  . PAT (paroxysmal atrial tachycardia) (HCC)    s/p ablation  . PONV (postoperative nausea and vomiting)   . PTSD (post-traumatic stress disorder)   . PVC's (premature ventricular contractions)   . Renal mass 02/15/2012  . RLS (restless legs syndrome)  Dr Gwenette Greet  . rt renal ca dx'd 12/2009   no treatment/ no surg  . Vertigo     Past Surgical History:  Procedure Laterality Date  . CARDIAC ELECTROPHYSIOLOGY [place] AND ABLATION  2000s  . LEFT HEART CATH AND CORONARY ANGIOGRAPHY N/A 06/08/2016   Procedure: Left Heart Cath and Coronary Angiography;  Surgeon: Belva Crome, MD;  Location: [facility];  Service: Cardiovascular;  Laterality: N/A;  . nasoseptal reconstruction  1990s  . THYROIDECTOMY  1990  . TONSILLECTOMY     as a child  . TUBAL LIGATION  1970s  . uterine mass removal   03/2012   was found to be benign   Family History:  Family History  Problem Relation Age of Onset  . Allergies Father   . Skin cancer Father   . Bipolar disorder Father   . Alcohol abuse Father   . Heart disease Mother   . Depression Mother   . Hypertension Mother   . CAD Mother   . Colon cancer Paternal Grandmother   . OCD Paternal Grandmother   . Allergies Sister        multiple  . Allergies Brother        multiple  . Allergies Daughter   . Heart disease Maternal Grandfather   . Cancer Paternal Grandfather    Family Psychiatric  History: See admission H&P Social History:  Social History   Substance and Sexual Activity  Alcohol Use Not Currently  . Alcohol/week: 1.0 standard drinks  . Types: 1 Glasses of wine per week     Social History   Substance and Sexual Activity  Drug Use No    Social History   Socioeconomic History  . Marital status: Single    Spouse name: Not on file  . Number of children: 1  . Years of education: 34  . Highest education level: Not on file  Occupational History  . 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 Financial resource strain: Not on file  . Food insecurity:    Worry: Not on file    Inability: Not on file  . Transportation needs:    Medical: Not on file    Non-medical: Not on file  Tobacco Use  . Smoking status: Never Smoker  . Smokeless tobacco: Never Used  Substance and Sexual Activity  . Alcohol use: Not Currently    Alcohol/week: 1.0 standard drinks    Types: 1 Glasses of wine per week  . Drug use: No  . Sexual activity: Yes    Partners: Male    Birth control/protection: None  Lifestyle  . Physical activity:    Days per week: Not on file    Minutes per session: Not on file  . Stress: Not on file  Relationships  . Social connections:    Talks on phone: Not on file    Gets together: Not on file    Attends religious service: Not on file    Active member of club or organization: Not on  file    Attends meetings of clubs or organizations: Not on file    Relationship status: Not on file  Other Topics Concern  . Not on file  Social History Narrative   Divorced and lives alone   Has children   Caffeine use: none   Right handed    Additional Social History:    Pain Medications: See PTA med list from [facility] outpatient Prescriptions: See [facility] outpatient med list  Over the Counter: None History of alcohol / drug use?: No history of alcohol / drug abuse                    Sleep: Fair  Appetite:  Fair  Current Medications: Current Facility-Administered Medications  Medication Dose Route Frequency Provider Last Rate Last Dose  . acetaminophen (TYLENOL) tablet 650 mg  650 mg Oral Q6H PRN Laverle Hobby, PA-C   650 mg at 05/27/18 2258  . alum & mag hydroxide-simeth (MAALOX/MYLANTA) 200-200-20 MG/5ML suspension 30 mL  30 mL Oral Q4H PRN Laverle Hobby, PA-C      . divalproex (DEPAKOTE ER) 24 hr tablet 500 mg  500 mg Oral Daily Cobos, Myer Peer, MD   500 mg at 05/28/18 0755  . FLUoxetine (PROZAC) capsule 10 mg  10 mg Oral Once Sharma Covert, MD      . Derrill Memo ON 05/29/2018] FLUoxetine (PROZAC) capsule 20 mg  20 mg Oral Daily Sharma Covert, MD      . hydrochlorothiazide (MICROZIDE) capsule 12.5 mg  12.5 mg Oral Daily Cobos, Myer Peer, MD   12.5 mg at 05/28/18 0755  . hydrOXYzine (ATARAX/VISTARIL) tablet 25 mg  25 mg Oral Q6H PRN Laverle Hobby, PA-C   25 mg at 05/26/18 2135  . levothyroxine (SYNTHROID, LEVOTHROID) tablet 88 mcg  88 mcg Oral Q0600 Cobos, Myer Peer, MD   88 mcg at 05/28/18 [phone]  . LORazepam (ATIVAN) tablet 0.5 mg  0.5 mg Oral Q6H PRN Sharma Covert, MD      . losartan (COZAAR) tablet 50 mg  50 mg Oral Daily Cobos, Myer Peer, MD   50 mg at 05/28/18 0755  . magnesium hydroxide (MILK OF MAGNESIA) suspension 30 mL  30 mL Oral Daily PRN Laverle Hobby, PA-C      . Menthol (Topical Analgesic) 2 % GEL 1 application  1 application Apply externally  Q6H PRN Hampton Abbot, MD   1 application at 66/44/03 0342  . nebivolol (BYSTOLIC) tablet 2.5 mg  2.5 mg Oral Daily Cobos, Myer Peer, MD   2.5 mg at 05/27/18 0745  . Papaya Enzyme CHEW 1 tablet  1 tablet Oral BID PRN Hampton Abbot, MD   1 tablet at 05/26/18 1810    Lab Results: No results found for this or any previous visit (from the past 48 hour(s)).  Blood Alcohol level:  Lab Results  Component Value Date   ETH <10 10/14/2017   ETH <10 47/42/5956    Metabolic Disorder Labs: Lab Results  Component Value Date   HGBA1C 5.5 05/26/2018   MPG 111.15 05/26/2018   Lab Results  Component Value Date   PROLACTIN 24.3 (H) 05/26/2018   Lab Results  Component Value Date   CHOL 177 05/26/2018   TRIG 68 05/26/2018   HDL 47 05/26/2018   CHOLHDL 3.8 05/26/2018   VLDL 14 05/26/2018   LDLCALC 116 (H) 05/26/2018   LDLCALC 102 (H) 04/21/2013    Physical Findings: AIMS: Facial and Oral Movements Muscles of Facial Expression: None, normal Lips and Perioral Area: None, normal Jaw: None, normal Tongue: None, normal,Extremity Movements Upper (arms, wrists, hands, fingers): None, normal Lower (legs, knees, ankles, toes): None, normal, Trunk Movements Neck, shoulders, hips: None, normal, Overall Severity Severity of abnormal movements (highest score from questions above): None, normal Incapacitation due to abnormal movements: None, normal Patient's awareness of abnormal movements (rate only patient's report): No Awareness, Dental Status Current problems with teeth and/or dentures?:  No Does patient usually wear dentures?: No  CIWA:    COWS:     Musculoskeletal: Strength & Muscle Tone: within normal limits Gait & Station: normal Patient leans: N/A  Psychiatric Specialty Exam: Physical Exam  Nursing note and vitals reviewed. Constitutional: She is oriented to person, place, and time. She appears well-developed and well-nourished.  HENT:  Head: Normocephalic and atraumatic.   Respiratory: Effort normal.  Neurological: She is alert and oriented to person, place, and time.    ROS  Blood pressure 125/79, pulse 79, temperature 98.2 F (36.8 C), temperature source Oral, resp. rate 16, height 5\' 3"  (1.6 m), weight 96.6 kg.Body mass index is 37.73 kg/m.  General Appearance: Casual  Eye Contact:  Fair  Speech:  Normal Rate  Volume:  Decreased  Mood:  Depressed  Affect:  Congruent  Thought Process:  Coherent and Descriptions of Associations: Intact  Orientation:  Full (Time, Place, and Person)  Thought Content:  Logical  Suicidal Thoughts:  No  Homicidal Thoughts:  No  Memory:  Immediate;   Fair Recent;   Fair Remote;   Fair  Judgement:  Intact  Insight:  Fair  Psychomotor Activity:  Psychomotor Retardation  Concentration:  Concentration: Fair and Attention Span: Fair  Recall:  [employer] of Knowledge:  Fair  Language:  Good  Akathisia:  Negative  Handed:  Right  AIMS (if indicated):     Assets:  Desire for Improvement Financial Resources/Insurance Housing Resilience Social Support  ADL's:  Intact  Cognition:  WNL  Sleep:  Number of Hours: 5.75     Treatment Plan Summary: Daily contact with patient to assess and evaluate symptoms and progress in treatment, Medication management and Plan : Patient is seen and examined.  Patient is a 73 year old female with the above-stated past psychiatric history who is seen in follow-up.  Patient stated she had a better day yesterday, but feels as though the Prozac needs to be increased.  She had been previously on his higher dose of 60 mg a day.  I will increase her fluoxetine to 20 mg p.o. daily.  I am also going to change her lorazepam to 0.5 mg p.o. every 6 hours as needed.  This is secondary to the fatigue.  Additionally, she does have sleep apnea, and it is unclear whether or not she needs to be on CPAP or BIPAP for this.  She did state that her pulmonologist had spoken to her about the possibility of  stimulants, but I told her that that could be taken care of as an outpatient between conversation of her psychiatrist and her pulmonologist.  No other changes to her current medications. 1.  Continue Depakote ER 500 mg p.o. daily for mood stability. 2.  Increase fluoxetine to 20 mg p.o. daily for anxiety and depression. 3.  Continue hydrochlorothiazide 12.5 mg p.o. daily for hypertension. 4.  Continue hydroxyzine 25 mg every 6 hours as needed anxiety. 5.  Continue levothyroxine 88 mcg p.o. daily for hypothyroidism. 6.  Change lorazepam to 0.5 mg p.o. every 6 hours as needed for anxiety. 7.  Continue losartan 50 mg p.o. daily for hypertension. 8.  Continue Bystolic 2.5 mg p.o. daily for hypertension. 9.  Disposition planning-in progress.  Sharma Covert, MD 05/28/2018, 9:35 AM

## 2018-05-29 ENCOUNTER — Ambulatory Visit (HOSPITAL_COMMUNITY): Payer: Medicare Other | Admitting: Licensed Clinical Social Worker

## 2018-05-29 NOTE — Progress Notes (Signed)
Patient rated her day a 6. Patient stated she had mixed emotions about discharge., but much less depression. Patient's goal for today was to find ways to love herself. Patient met her goal.  

## 2018-05-29 NOTE — Plan of Care (Addendum)
Problem: Education: Goal: Emotional status will improve Outcome: Progressing Goal: Mental status will improve Outcome: Progressing   Problem: Activity: Goal: Interest or engagement in activities will improve Outcome: Progressing   Problem: Health Behavior/Discharge Planning: Goal: Identification of resources available to assist in meeting health care needs will improve Outcome: Progressing   Problem: Safety: Goal: Periods of time without injury will increase Outcome: Progressing Pt observed in hall on initial approach. Presents animated with bright affect, fidgety and talkative on interactions. Denies SI, HI, AVH and pain when assessed "I feel much better now being away from my situation and focusing on me in here". Pt came to the window requesting PRN medication for anxiety "the doctor told me I'm going home tomorrow, I'm just anxious about going home back home tomorrow".  Reports slept well last night "I woke up laughing twice last night but I was able to go back to bed". Stated her appetite is good with normal energy and good concentration level". Scheduled and PRN (Ativan at 1112, 1736) medications administered per provider's order with verbal education and effects monitored. Encouraged pt to voice concerns, attend to ADLs and comply with current treatment regimen including groups. Safety checks maintained at Q 15 minutes observation without self harm gestures or outburst to note at this time.  Pt visible in milieu for majority of this shift. Attended scheduled groups and was engaged in discussions and activities; interacts well with staff and peers.  Tolerates all PO intake well. Compliant with medications, denies discomfort / side effects. POC remains effective for safety and mood stability.  Denies concerns at this time.
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
## 2018-05-30 ENCOUNTER — Other Ambulatory Visit (HOSPITAL_COMMUNITY): Payer: Self-pay | Admitting: Psychiatry

## 2018-05-30 DIAGNOSIS — R45851 Suicidal ideations: Secondary | ICD-10-CM

## 2018-05-30 DIAGNOSIS — Z811 Family history of alcohol abuse and dependence: Secondary | ICD-10-CM

## 2018-05-30 DIAGNOSIS — F3130 Bipolar disorder, current episode depressed, mild or moderate severity, unspecified: Secondary | ICD-10-CM

## 2018-05-30 DIAGNOSIS — Z818 Family history of other mental and behavioral disorders: Secondary | ICD-10-CM

## 2018-05-30 MED ORDER — LORAZEPAM 0.5 MG PO TABS: 0.5000 mg | ORAL_TABLET | Freq: Four times a day (QID) | ORAL | 0 refills | Status: DC | PRN

## 2018-05-30 MED ORDER — FLUOXETINE HCL 20 MG PO CAPS: 20.0000 mg | ORAL_CAPSULE | Freq: Every day | ORAL | 0 refills | Status: DC

## 2018-05-30 MED ORDER — LEVOTHYROXINE SODIUM 88 MCG PO TABS: 88.0000 ug | ORAL_TABLET | Freq: Every day | ORAL | 0 refills | Status: DC

## 2018-05-30 MED ORDER — ACETAMINOPHEN 325 MG PO TABS: 650.0000 mg | ORAL_TABLET | Freq: Four times a day (QID) | ORAL | Status: DC | PRN

## 2018-05-30 MED ORDER — DIVALPROEX SODIUM ER 500 MG PO TB24: 500.0000 mg | ORAL_TABLET | Freq: Every day | ORAL | 0 refills | Status: DC

## 2018-05-30 MED ORDER — NEBIVOLOL HCL 2.5 MG PO TABS: 2.5000 mg | ORAL_TABLET | Freq: Every day | ORAL | 0 refills | Status: DC

## 2018-05-30 MED ORDER — PAPAYA ENZYME PO CHEW: 1.0000 | CHEWABLE_TABLET | Freq: Two times a day (BID) | ORAL | 0 refills | Status: DC | PRN

## 2018-05-30 NOTE — BHH Suicide Risk Assessment (Signed)
[facility] Discharge Suicide Risk Assessment   Principal Problem: MDD (major depressive disorder), severe ([place]) Discharge Diagnoses: Principal Problem:   MDD (major depressive disorder), severe ([place]) Active Problems:   Moderate bipolar I disorder, most recent episode depressed ([place])   Total Time spent with patient: 15 minutes  Musculoskeletal: Strength & Muscle Tone: within normal limits Gait & Station: normal Patient leans: N/A  Psychiatric Specialty Exam: Review of Systems  All other systems reviewed and are negative.   Blood pressure 139/82, pulse 73, temperature 97.6 F (36.4 C), resp. rate 18, height 5\' 3"  (1.6 m), weight 96.6 kg.Body mass index is 37.73 kg/m.  General Appearance: Casual  Eye Contact::  Fair  Speech:  Normal Rate409  Volume:  Normal  Mood:  Anxious  Affect:  Congruent  Thought Process:  Coherent and Descriptions of Associations: Intact  Orientation:  Full (Time, Place, and Person)  Thought Content:  Logical  Suicidal Thoughts:  No  Homicidal Thoughts:  No  Memory:  Immediate;   Fair Recent;   Fair Remote;   Fair  Judgement:  Intact  Insight:  Fair  Psychomotor Activity:  Increased  Concentration:  Fair  Recall:  [employer] of Knowledge:Fair  Language: Fair  Akathisia:  Negative  Handed:  Right  AIMS (if indicated):     Assets:  Desire for Improvement Financial Resources/Insurance Housing Resilience Social Support  Sleep:  Number of Hours: 5.25  Cognition: WNL  ADL's:  Intact   Mental Status Per Nursing Assessment::   On Admission:  Suicidal ideation indicated by patient  Demographic Factors:  Age 27 or older, Caucasian and Unemployed  Loss Factors: NA  Historical Factors: Impulsivity  Risk Reduction Factors:   Sense of responsibility to family, Living with another person, especially a relative, Positive social support and Positive therapeutic relationship  Continued Clinical Symptoms:  Bipolar Disorder:   Depressive  phase  Cognitive Features That Contribute To Risk:  None    Suicide Risk:  Minimal: No identifiable suicidal ideation.  Patients presenting with no risk factors but with morbid ruminations; may be classified as minimal risk based on the severity of the depressive symptoms  Follow-up Information    [facility] Partial Hospitalization Program Follow up.   Contact information: [address], [place]  [place] [address] Phone: [phone] Fax: [phone]       [facility] Follow up on 06/08/2018.   Specialty:  Behavioral Health Why:  Medication management appointment with Dr. Pershing Cox is Thursday, 2/19 at 2:00p. Therapy appointment with Ramond Craver is Monday, 3/23 at 2:00p.  Contact information: [place] [phone]          Plan Of Care/Follow-up recommendations:  Activity:  ad lib  Sharma Covert, MD 05/30/2018, 9:11 AM

## 2018-05-30 NOTE — Progress Notes (Signed)
Pt discharged to lobby. Pt was stable and appreciative at that time. All papers and prescriptions were given and valuables returned. Verbal understanding expressed. Denies SI/HI and A/VH. Pt given opportunity to express concerns and ask questions.  

## 2018-05-30 NOTE — Progress Notes (Signed)
  [facility] Adult Case Management Discharge Plan :  Will you be returning to the same living situation after discharge:  Yes,  patient reports she is returning home with her fiance' At discharge, do you have transportation home?: Yes,  patient states a friend or her fiance will pick her up at discharge Do you have the ability to pay for your medications: Yes,  [facility]  Release of information consent forms completed and in the chart;  Patient's signature needed at discharge.  Patient to Follow up at: Follow-up Information    [facility] Partial Hospitalization Program. Go on 06/01/2018.   Why:  [facility] (initial) appointment for partial hospitalization services is Thursday, 06/01/2018 at 1:30pm. Please be sure to bring any discharge paperwork from this hospitalization.  Contact information: [address], [place]  [place]  [address] Phone: [phone] Fax: [phone]       [facility] Follow up on 06/08/2018.   Specialty:  Behavioral Health Why:  Medication management appointment with Dr. Pershing Cox is Thursday, 2/19 at 2:00p. Therapy appointment with Ramond Craver is Monday, 3/23 at 2:00p.  Contact information: [place] [phone]          Next level of care provider has access to Industry and Suicide Prevention discussed: Yes,  with the patient; attempted her fiance  Have you used any form of tobacco in the last 30 days? (Cigarettes, Smokeless Tobacco, Cigars, and/or Pipes): No  Has patient been referred to the Quitline?: N/A patient is not a smoker  Patient has been referred for addiction treatment: [place], [place] 05/30/2018, 2:19 PM

## 2018-05-30 NOTE — Discharge Summary (Signed)
Physician Discharge Summary Note  Patient:  [name] is an 73 y.o., female MRN:  [id number] DOB:  September 19, 1945 Patient phone:  [phone] (home)  Patient address:   [address] [place] [address],  Total Time spent with patient: 15 minutes  Date of Admission:  05/25/2018 Date of Discharge: 05/30/2018  Reason for Admission: suicidal ideation  Principal Problem: MDD (major depressive disorder), severe ([place]) Discharge Diagnoses: Principal Problem:   MDD (major depressive disorder), severe ([place]) Active Problems:   Moderate bipolar I disorder, most recent episode depressed [facility])   Past Psychiatric History: Per admission H&P: Multiple hospitalizations, most recently at [facility] in January 2019 for depression and discharged on Latuda, Depakote, and Ativan. Patient reports she had increased irritability with Latuda. Denies history of suicide attempts, self-injurious behaviors, or psychosis.    Past Medical History:  Past Medical History:  Diagnosis Date  . Abscess of Bartholin's gland   . Acute vestibular neuronitis    Dr Lucia Gaskins  . ADHD (attention deficit hyperactivity disorder)   . Adverse effect of general anesthetic    felt paralyzed while receiving anesthesia  . Anxiety   . Bipolar 1 disorder ([place])   . CAD (coronary artery disease), native coronary artery    cath 05/2016 showing 50-70% stenosis in the mid LAD proximal to the first diagonal and 70-80% small OM1. FFR of LAD  not performed because of difficulty with catheter control from the right radial.   . Chronic kidney disease    kidney cancer- pt states she has elected to not have it treated.  . Depression    Bipolar disorder/goes to [place] [facility] for meds  . Difficult intubation    told by [facility] that she was hard to intubate 15b yrs ago in [place]- surgery since then no problems  . GERD (gastroesophageal reflux disease)   . H/O echocardiogram 07/2012   [place] LVF w grade I siastolic dysfunction   . History  of attention deficit disorder   . History of endometriosis   . History of pleural effusion   . Hyperlipidemia LDL goal <70 06/24/2016  . Hypertension   . 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 Memory difficulty 01/26/2017  . Migraines   . Obesity   . OSA (obstructive sleep apnea)    uses oral appliance instead of CPAP  . PAT (paroxysmal atrial tachycardia) (HCC)    s/p ablation  . PONV (postoperative nausea and vomiting)   . PTSD (post-traumatic stress disorder)   . PVC's (premature ventricular contractions)   . Renal mass 02/15/2012  . RLS (restless legs syndrome)    Dr Gwenette Greet  . rt renal ca dx'd 12/2009   no treatment/ no surg  . Vertigo     Past Surgical History:  Procedure Laterality Date  . CARDIAC ELECTROPHYSIOLOGY [facility] AND ABLATION  2000s  . LEFT HEART CATH AND CORONARY ANGIOGRAPHY N/A 06/08/2016   Procedure: Left Heart Cath and Coronary Angiography;  Surgeon: Belva Crome, MD;  Location: [place] CV LAB;  Service: Cardiovascular;  Laterality: N/A;  . nasoseptal reconstruction  1990s  . THYROIDECTOMY  1990  . TONSILLECTOMY     as a child  . TUBAL LIGATION  1970s  . uterine mass removal  03/2012   was found to be benign   Family History:  Family History  Problem Relation Age of Onset  . Allergies Father   . Skin cancer Father   . Bipolar disorder Father   .  Alcohol abuse Father   . Heart disease Mother   . Depression Mother   . Hypertension Mother   . CAD Mother   . Colon cancer Paternal Grandmother   . OCD Paternal Grandmother   . Allergies Sister        multiple  . Allergies Brother        multiple  . Allergies Daughter   . Heart disease Maternal Grandfather   . Cancer Paternal Grandfather    Family Psychiatric  History: Per admission H&P: Father was alcoholic and possible bipolar disorder. Paternal uncle bipolar. Mother with depression. Social History:  Social History   Substance and Sexual Activity  Alcohol  Use Not Currently  . Alcohol/week: 1.0 standard drinks  . Types: 1 Glasses of wine per week     Social History   Substance and Sexual Activity  Drug Use No    Social History   Socioeconomic History  . Marital status: Single    Spouse name: Not on file  . Number of children: 1  . Years of education: 2  . Highest education level: Not on file  Occupational History  . 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 Financial resource strain: Not on file  . Food insecurity:    Worry: Not on file    Inability: Not on file  . Transportation needs:    Medical: Not on file    Non-medical: Not on file  Tobacco Use  . Smoking status: Never Smoker  . Smokeless tobacco: Never Used  Substance and Sexual Activity  . Alcohol use: Not Currently    Alcohol/week: 1.0 standard drinks    Types: 1 Glasses of wine per week  . Drug use: No  . Sexual activity: Yes    Partners: Male    Birth control/protection: None  Lifestyle  . Physical activity:    Days per week: Not on file    Minutes per session: Not on file  . Stress: Not on file  Relationships  . Social connections:    Talks on phone: Not on file    Gets together: Not on file    Attends religious service: Not on file    Active member of club or organization: Not on file    Attends meetings of clubs or organizations: Not on file    Relationship status: Not on file  Other Topics Concern  . Not on file  Social History Narrative   Divorced and lives alone   Has children   Caffeine use: none   Right handed     Hospital Course:  Per admission H&P 05/26/2018: [name] is a 73 year old female with history of bipolar disorder, PTSD, CAD, HTN, RLS, renal cancer, and thyroid cancer s/p thyroidectomy years ago, presenting for treatment of suicidal ideation. She reports compliance with home meds- Depakote 500 mg daily and Ativan 1 mg BID. She started Cymbalta recently for a few weeks but stopped taking it, reports  it was not helpful. Denies SI/HI/AVH. From MD's admission SRA: Presented to hospital voluntarily, due to worsening depression. States " I have been feeling more depressed over the last few weeks", and reports has been neglecting ADLs, has become more isolative, and describes neuro-vegetative symptoms of depression, including sadness, anhedonia, hypersomnia, poor energy level . She denies suicidal plan or intention, but has had some passive SI, such as wishing she would not wake up. Attributes depression in part to financial difficulties, which has made it difficult for her to  move out independently. States she lives with her fiance, who has some cognitive difficulties, history of seizures, and she states shebut that she would prefer to live independently.  Ms. Friebel was admitted for suicidal ideation. Depakote was continued. Ativan was titrated down to 0.5 mg BID PRN anxiety. Prozac was started. Medications for HTN and thyroid were continued. She participated in group therapy on the unit. She responded well to treatment with no adverse effects reported. She remained on the [facility] unit for 5 days. She stabilized with medication and therapy. She was discharged on the medications listed below. She has shown improvement with improved mood, affect, sleep, appetite, and interaction. She denies any SI/HI/AVH and contracts for safety. She agrees to follow up with [facility] and PHP (see below). Patient is provided with prescriptions for medications upon discharge. Her friend is picking her up for discharge home.  Physical Findings: AIMS: Facial and Oral Movements Muscles of Facial Expression: None, normal Lips and Perioral Area: None, normal Jaw: None, normal Tongue: None, normal,Extremity Movements Upper (arms, wrists, hands, fingers): None, normal Lower (legs, knees, ankles, toes): None, normal, Trunk Movements Neck, shoulders, hips: None, normal, Overall Severity Severity of abnormal  movements (highest score from questions above): None, normal Incapacitation due to abnormal movements: None, normal Patient's awareness of abnormal movements (rate only patient's report): No Awareness, Dental Status Current problems with teeth and/or dentures?: No Does patient usually wear dentures?: No  CIWA:    COWS:     Musculoskeletal: Strength & Muscle Tone: within normal limits Gait & Station: normal Patient leans: N/A  Psychiatric Specialty Exam: Physical Exam  Nursing note and vitals reviewed. Constitutional: She is oriented to person, place, and time. She appears well-developed and well-nourished.  Cardiovascular: Normal rate.  Respiratory: Effort normal.  Neurological: She is alert and oriented to person, place, and time.    Review of Systems  Constitutional: Negative.   Psychiatric/Behavioral: Positive for depression (improving). Negative for hallucinations, memory loss, substance abuse and suicidal ideas. The patient is not nervous/anxious and does not have insomnia.     Blood pressure 139/82, pulse 73, temperature 97.6 F (36.4 C), resp. rate 18, height 5\' 3"  (1.6 m), weight 96.6 kg.Body mass index is 37.73 kg/m.  See MD's discharge SRA     Have you used any form of tobacco in the last 30 days? (Cigarettes, Smokeless Tobacco, Cigars, and/or Pipes): No  Has this patient used any form of tobacco in the last 30 days? (Cigarettes, Smokeless Tobacco, Cigars, and/or Pipes)  No  Blood Alcohol level:  Lab Results  Component Value Date   ETH <10 10/14/2017   ETH <10 62/69/4854    Metabolic Disorder Labs:  Lab Results  Component Value Date   HGBA1C 5.5 05/26/2018   MPG 111.15 05/26/2018   Lab Results  Component Value Date   PROLACTIN 24.3 (H) 05/26/2018   Lab Results  Component Value Date   CHOL 177 05/26/2018   TRIG 68 05/26/2018   HDL 47 05/26/2018   CHOLHDL 3.8 05/26/2018   VLDL 14 05/26/2018   LDLCALC 116 (H) 05/26/2018   LDLCALC 102 (H) 04/21/2013     See Psychiatric Specialty Exam and Suicide Risk Assessment completed by Attending Physician prior to discharge.  Discharge destination:  Home  Is patient on multiple antipsychotic therapies at discharge:  No   Has Patient had three or more failed trials of antipsychotic monotherapy by history:  No  Recommended Plan for Multiple Antipsychotic Therapies: NA  Discharge Instructions  Discharge instructions   Complete by:  As directed    Patient is instructed to take all prescribed medications as recommended. Report any side effects or adverse reactions to your outpatient psychiatrist. Patient is instructed to abstain from alcohol and illegal drugs while on prescription medications. In the event of worsening symptoms, patient is instructed to call the crisis hotline, 911, or go to the nearest emergency department for evaluation and treatment.     Allergies as of 05/30/2018      Reactions   Geodon [ziprasidone Hydrochloride] Other (See Comments)   Extremely agitated   Lithium Nausea Only, Other (See Comments)   Off balance, increased heart rate   Talwin [pentazocine] Other (See Comments)   Hallucinations    Toradol [ketorolac Tromethamine] Other (See Comments)   Chest pains   Ziprasidone Other (See Comments)   Extremely agitated   Abilify [aripiprazole] Other (See Comments)   jerking   Compazine [prochlorperazine Edisylate] Nausea And Vomiting   Latuda [lurasidone Hcl] Other (See Comments)   Reports made her mind race more and irritable    Cbd Kings [lido-menthol-methyl Sal-camph] Other (See Comments)   Tightness in chest when used.   Pristiq [desvenlafaxine Succinate Er] Other (See Comments)   Did not work, prefers not to take   Rexulti [brexpiprazole] Swelling   Elevated BP, created aggression   Diflucan [fluconazole] Nausea And Vomiting      Medication List    STOP taking these medications   acetaminophen 650 MG CR tablet Commonly known as:  TYLENOL Replaced by:   acetaminophen 325 MG tablet   aspirin 81 MG EC tablet   b complex vitamins tablet   diphenhydrAMINE 25 MG tablet Commonly known as:  BENADRYL   Magnesium 500 MG Tabs   OVER THE COUNTER MEDICATION   rOPINIRole 0.5 MG tablet Commonly known as:  REQUIP   VITAMIN D PO     TAKE these medications     Indication  acetaminophen 325 MG tablet Commonly known as:  TYLENOL Take 2 tablets (650 mg total) by mouth every 6 (six) hours as needed for mild pain. Replaces:  acetaminophen 650 MG CR tablet  Indication:  Pain   dimenhyDRINATE 50 MG tablet Commonly known as:  DRAMAMINE Take 25 mg by mouth every 8 (eight) hours as needed for nausea or dizziness.  Indication:  Nausea and Vomiting   divalproex 500 MG 24 hr tablet Commonly known as:  DEPAKOTE ER Take 1 tablet (500 mg total) by mouth daily. For mood What changed:  additional instructions  Indication:  Mood   FLUoxetine 20 MG capsule Commonly known as:  PROZAC Take 1 capsule (20 mg total) by mouth daily. For mood Start taking on:  May 31, 2018  Indication:  Mood   levothyroxine 88 MCG tablet Commonly known as:  SYNTHROID, LEVOTHROID Take 1 tablet (88 mcg total) by mouth daily at 6 (six) AM. For hypothyroidism Start taking on:  May 31, 2018 What changed:    when to take this  additional instructions  Indication:  Underactive Thyroid   LORazepam 0.5 MG tablet Commonly known as:  ATIVAN Take 1 tablet (0.5 mg total) by mouth every 6 (six) hours as needed for anxiety. What changed:    medication strength  how much to take  when to take this  Indication:  Feeling Anxious   losartan-hydrochlorothiazide 50-12.5 MG tablet Commonly known as:  HYZAAR Take 1 tablet by mouth daily.  Indication:  High Blood Pressure Disorder   nebivolol 2.5 MG  tablet Commonly known as:  BYSTOLIC Take 1 tablet (2.5 mg total) by mouth daily. For high blood pressure Start taking on:  May 31, 2018 What changed:  additional  instructions  Indication:  High Blood Pressure Disorder   Papaya Enzyme Chew Chew 1 tablet by mouth 2 (two) times daily as needed (indigestion).  Indication:  Indigestion      Follow-up Information    [facility] Partial Hospitalization Program. Go on 06/01/2018.   Why:  [facility] (initial) appointment for partial hospitalization services is Thursday, 06/01/2018 at 1:30pm. Please be sure to bring any discharge paperwork from this hospitalization.  Contact information: [address] [place]  [place] [address] Phone: [phone] Fax: [phone]       [facility] Follow up on 06/08/2018.   Specialty:  Behavioral Health Why:  Medication management appointment with Dr. Pershing Cox is Thursday, 2/19 at 2:00p. Therapy appointment with Ramond Craver is Monday, 3/23 at 2:00p.  Contact information: [place] [phone]          Follow-up recommendations: Activity as tolerated. Diet as recommended by primary care physician. Keep all scheduled follow-up appointments as recommended.   Comments:   Patient is instructed to take all prescribed medications as recommended. Report any side effects or adverse reactions to your outpatient psychiatrist. Patient is instructed to abstain from alcohol and illegal drugs while on prescription medications. In the event of worsening symptoms, patient is instructed to call the crisis hotline, 911, or go to the nearest emergency department for evaluation and treatment.  Signed: Connye Burkitt, NP 05/30/2018, 2:55 PM

## 2018-05-30 NOTE — BHH Suicide Risk Assessment (Signed)
[place] INPATIENT:  Family/Significant Other Suicide Prevention Education  Suicide Prevention Education:  Contact Attempts: Shirlean Mylar, fiance [phone])  has been identified by the patient as the family member/significant other with whom the patient will be residing, and identified as the person(s) who will aid the patient in the event of a mental health crisis.  With written consent from the patient, two attempts were made to provide suicide prevention education, prior to and/or following the patient's discharge.  We were unsuccessful in providing suicide prevention education.  A suicide education pamphlet was given to the patient to share with family/significant other.   Date and time of second attempt:05/30/2018 /1:58pm  Marylee Floras 05/30/2018, 1:59 PM

## 2018-05-30 NOTE — Progress Notes (Signed)
Patient attended grief and loss group facilitated by Kerry Hough, MS, [facility], [place].   Group focuses on change and loss experienced by group members; topics include loss due to death, loss of relationships, loss of a place, loss of sense of self, etc. Group members are invited to share the changes and losses that are currently affecting their lives. Facilitators tie together shared experiences between group members and validate emotions felt by participants.  Group members participated in a visual art activity in which they chose photos that resonated with their experience of grief. Members were given the opportunity to share the photo they chose with the group.   Patient [name] was present throughout group. Pt discussed grieving the loss of her past due to depression. She identified goals she once had that she never completed due to her depression. During the art activity, pt selected a photo of three children playing. She became emotional as she shared the photo, as she reported it reminded her that she did not have a happy childhood. She identified that this was the first time she has acknowledged her childhood grief and reported that she will discuss this further with her outpatient counselor.  Kerry Hough, MS, [facility], [place]

## 2018-05-30 NOTE — Plan of Care (Signed)
D: Patient is alert, oriented, pleasant, and cooperative. Denies SI, HI, AVH, and verbally contracts for safety. Patient denies physical symptoms/pain.    A: Medications administered per MD order. Support provided. Patient educated on safety on the unit and medications. Routine safety checks every 15 minutes. Patient stated understanding to tell nurse about any new physical symptoms. Patient understands to tell staff of any needs.     R: No adverse drug reactions noted. Patient verbally contracts for safety. Patient remains safe at this time and will continue to monitor.   Problem: Education: Goal: Mental status will improve Outcome: Progressing   Problem: Safety: Goal: Periods of time without injury will increase Outcome: Progressing   Patient denies SI, HI, AVH, and contracts for safety. Patient remains safe and will continue to monitor.

## 2018-05-30 NOTE — BHH Suicide Risk Assessment (Signed)
[facility] INPATIENT:  Family/Significant Other Suicide Prevention Education  Suicide Prevention Education:   SPE completed with patient, as patient refused to consent to family contact. SPI pamphlet provided to pt and pt was encouraged to share information with support network, ask questions, and talk about any concerns relating to SPE. Patient denies access to guns/firearms and verbalized understanding of information provided. Mobile Crisis information also provided to patient.  Kerstyn Coryell, MSW, LCSWA Clinical Social Worker [facility]  Phone: [phone]  

## 2018-05-30 NOTE — BHH Suicide Risk Assessment (Addendum)
[place] INPATIENT:  Family/Significant Other Suicide Prevention Education  Suicide Prevention Education:  Contact Attempts: Shirlean Mylar, fiance [phone]) has been identified by the patient as the family member/significant other with whom the patient will be residing, and identified as the person(s) who will aid the patient in the event of a mental health crisis.  With written consent from the patient, two attempts were made to provide suicide prevention education, prior to and/or following the patient's discharge.  We were unsuccessful in providing suicide prevention education.  A suicide education pamphlet was given to the patient to share with family/significant other.  Date and time of first attempt: 05/30/2018   [name] 05/30/2018, 9:16 AM

## 2018-06-01 ENCOUNTER — Other Ambulatory Visit (HOSPITAL_COMMUNITY): Payer: Medicare Other | Attending: Psychiatry | Admitting: Licensed Clinical Social Worker

## 2018-06-01 ENCOUNTER — Other Ambulatory Visit: Payer: Self-pay

## 2018-06-01 DIAGNOSIS — F3130 Bipolar disorder, current episode depressed, mild or moderate severity, unspecified: Secondary | ICD-10-CM

## 2018-06-01 NOTE — Psych (Signed)
2:30p - 2:45p  Pt came for ax for PHP per inpt. Pt just d/c from inpt due to depression and SI. Pt denies SI at this time. Pt spends time telling cln how "miserable" an experience pt had at inpt. "They didn't have groups. We would be waiting around for hours and then I would go to the nurses' station and ask what was going on because I will stand up for myself and they wouldn't tell us anything." Pt reports she felt "no one listened to [place] over there; no one cared. It was a waste." Pt states she wasn't told she was supposed to come for an ax today, she saw it on her discharge paperwork. Pt states "the thought of being here every morning at 9 makes me crazy. I've been in bed for weeks and I don't want to get up that early. I thought I was going to follow up with [facility] classes because I've been going to their Tuesday night groups. I just need something more than that so I thought the classes would be good." Cln spent time discussing options and told pt PHP is voluntary. Pt understands. Pt declines PHP at this time and chooses to f/u with [facility] classes and support groups, with individual therapy with Micheline Chapman on 3/23 at 2p, and psychiatry with Dr. Doyne Keel on 3/19 at 2p. Pt denies any SI/HI/AVH at this time. Cln encourages pt to follow up with this cln if she finds she needs more support in the future. Pt agrees.   Hanne Kegg, LPCA, LCASA

## 2018-06-08 ENCOUNTER — Ambulatory Visit (INDEPENDENT_AMBULATORY_CARE_PROVIDER_SITE_OTHER): Payer: Medicare Other | Admitting: Psychiatry

## 2018-06-08 ENCOUNTER — Other Ambulatory Visit: Payer: Self-pay

## 2018-06-08 ENCOUNTER — Encounter (HOSPITAL_COMMUNITY): Payer: Self-pay | Admitting: Psychiatry

## 2018-06-08 DIAGNOSIS — F3130 Bipolar disorder, current episode depressed, mild or moderate severity, unspecified: Secondary | ICD-10-CM

## 2018-06-08 MED ORDER — FLUOXETINE HCL 20 MG PO CAPS: 20.0000 mg | ORAL_CAPSULE | Freq: Every day | ORAL | 1 refills | Status: DC

## 2018-06-08 MED ORDER — LORAZEPAM 0.5 MG PO TABS: 0.5000 mg | ORAL_TABLET | Freq: Four times a day (QID) | ORAL | 1 refills | Status: DC | PRN

## 2018-06-08 MED ORDER — DIVALPROEX SODIUM ER 500 MG PO TB24: 500.0000 mg | ORAL_TABLET | Freq: Every day | ORAL | 1 refills | Status: DC

## 2018-06-08 NOTE — Progress Notes (Signed)
[place] MD/PA/NP OP Progress Note  06/08/2018 2:37 PM [name]  MRN:  [id number]  Chief Complaint:  Chief Complaint    Depression; Anxiety; Post-Traumatic Stress Disorder     HPI: [name] comes in for hospital follow-up today.  She was admitted to [facility] behavioral inpatient hospital on 05/25/2018 for depression and suicidal ideations.  She was discharged on 05/30/2018.  During hospitalization she was started on Prozac and her Ativan dose was decreased to 0.5 mg up to 4 times a day.  She states that the hospitalization was very chaotic and poor.  She states that groups were not being held on a regular basis and that the day room was "chaos" and that the techs and nursing staff were rude.  Despite all of this she tells me "I am feeling better".  She states that her severe depression improved.  She is still having depression symptoms and tells me they include low motivation and desire to isolate.  She notes that her sleep is a little bit better.  She has been having issues with her energy but has been drinking 5-hour energy.  She states that it is very helpful and she has been able to clean out the linen closet, wash that she is, reorganize some of her items and read a book.  She has not engaged in many of these activities for years and was happy to do so.  Her anxiety is much improved.  She is no longer experiencing racing thoughts or restlessness.  She feels calm and feels as though her patience has improved.  She is experiencing ongoing PTSD but states that for now it is tolerable.  The Prozac does seem to be helping.  She is continuing to take the Depakote.  She has been taking Ativan 0.5 mg twice a day and has not felt the need for more than that.  She is still thinking of moving but does not have the finances for it yet.  Patient denies SI/HI.  She denies any manic or hypomanic-like symptoms since discharge.      Visit Diagnosis:    ICD-10-CM   1. Bipolar I disorder, most recent episode depressed  (HCC) F31.30 LORazepam (ATIVAN) 0.5 MG tablet    FLUoxetine (PROZAC) 20 MG capsule    divalproex (DEPAKOTE ER) 500 MG 24 hr tablet     Past Psychiatric History:  Anxiety:Yes Bipolar Disorder:Yes Depression:Yes Mania:Yes Psychosis:No Schizophrenia:No Personality Disorder:No Hospitalization for psychiatric illness:Yes History of Electroconvulsive Shock Therapy:No Prior Suicide Attempts:No Previous meds- Rexulti unable to tolerate, Trazodone-unable to tolerate; Pristiq-ineffective, Wellbutrin-ineffective, Lamictal was ok but seemed ineffective; Lithium     Past Medical History:  Past Medical History:  Diagnosis Date  . Abscess of Bartholin's gland   . Acute vestibular neuronitis    Dr Lucia Gaskins  . ADHD (attention deficit hyperactivity disorder)   . Adverse effect of general anesthetic    felt paralyzed while receiving anesthesia  . Anxiety   . Bipolar 1 disorder ([place])   . CAD (coronary artery disease), native coronary artery    cath 05/2016 showing 50-70% stenosis in the mid LAD proximal to the first diagonal and 70-80% small OM1. FFR of LAD  not performed because of difficulty with catheter control from the right radial.   . Chronic kidney disease    kidney cancer- pt states she has elected to not have it treated.  . Depression    Bipolar disorder/goes to [facility] for meds  . Difficult intubation    told by [facility]  that she was hard to intubate 15b yrs ago in [place]- surgery since then no problems  . GERD (gastroesophageal reflux disease)   . H/O echocardiogram 07/2012   [place] LVF w grade I siastolic dysfunction   . History of attention deficit disorder   . History of endometriosis   . History of pleural effusion   . Hyperlipidemia LDL goal <70 06/24/2016  . Hypertension   . 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 Memory difficulty 01/26/2017  . Migraines   . Obesity   . OSA (obstructive sleep apnea)    uses oral appliance  instead of CPAP  . PAT (paroxysmal atrial tachycardia) (HCC)    s/p ablation  . PONV (postoperative nausea and vomiting)   . PTSD (post-traumatic stress disorder)   . PVC's (premature ventricular contractions)   . Renal mass 02/15/2012  . RLS (restless legs syndrome)    Dr Gwenette Greet  . rt renal ca dx'd 12/2009   no treatment/ no surg  . Vertigo     Past Surgical History:  Procedure Laterality Date  . CARDIAC ELECTROPHYSIOLOGY [place] AND ABLATION  2000s  . LEFT HEART CATH AND CORONARY ANGIOGRAPHY N/A 06/08/2016   Procedure: Left Heart Cath and Coronary Angiography;  Surgeon: Belva Crome, MD;  Location: [facility];  Service: Cardiovascular;  Laterality: N/A;  . nasoseptal reconstruction  1990s  . THYROIDECTOMY  1990  . TONSILLECTOMY     as a child  . TUBAL LIGATION  1970s  . uterine mass removal  03/2012   was found to be benign    Family Psychiatric History:  Family History  Problem Relation Age of Onset  . Allergies Father   . Skin cancer Father   . Bipolar disorder Father   . Alcohol abuse Father   . Heart disease Mother   . Depression Mother   . Hypertension Mother   . CAD Mother   . Colon cancer Paternal Grandmother   . OCD Paternal Grandmother   . Allergies Sister        multiple  . Allergies Brother        multiple  . Allergies Daughter   . Heart disease Maternal Grandfather   . Cancer Paternal Grandfather     Social History:  Social History   Socioeconomic History  . Marital status: Single    Spouse name: Not on file  . Number of children: 1  . Years of education: 51  . Highest education level: Not on file  Occupational History  . 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 Financial resource strain: Not on file  . Food insecurity:    Worry: Not on file    Inability: Not on file  . Transportation needs:    Medical: Not on file    Non-medical: Not on file  Tobacco Use  . Smoking status: Never Smoker  . Smokeless  tobacco: Never Used  Substance and Sexual Activity  . Alcohol use: Not Currently    Alcohol/week: 1.0 standard drinks    Types: 1 Glasses of wine per week  . Drug use: No  . Sexual activity: Yes    Partners: Male    Birth control/protection: None  Lifestyle  . Physical activity:    Days per week: Not on file    Minutes per session: Not on file  . Stress: Not on file  Relationships  . Social connections:    Talks on phone: Not  on file    Gets together: Not on file    Attends religious service: Not on file    Active member of club or organization: Not on file    Attends meetings of clubs or organizations: Not on file    Relationship status: Not on file  Other Topics Concern  . Not on file  Social History Narrative   Divorced and lives alone   Has children   Caffeine use: none   Right handed     Allergies:  Allergies  Allergen Reactions  . Geodon [Ziprasidone Hydrochloride] Other (See Comments)    Extremely agitated  . Lithium Nausea Only and Other (See Comments)    Off balance, increased heart rate  . Talwin [Pentazocine] Other (See Comments)    Hallucinations   . Toradol [Ketorolac Tromethamine] Other (See Comments)    Chest pains  . Ziprasidone Other (See Comments)    Extremely agitated  . Abilify [Aripiprazole] Other (See Comments)    jerking  . Compazine [Prochlorperazine Edisylate] Nausea And Vomiting  . Latuda [Lurasidone Hcl] Other (See Comments)    Reports made her mind race more and irritable   . Cbd [facility]] Other (See Comments)    Tightness in chest when used.  . Pristiq [Desvenlafaxine Succinate Er] Other (See Comments)    Did not work, prefers not to take  . Rexulti [Brexpiprazole] Swelling    Elevated BP, created aggression  . Diflucan [Fluconazole] Nausea And Vomiting    Metabolic Disorder Labs: Lab Results  Component Value Date   HGBA1C 5.5 05/26/2018   MPG 111.15 05/26/2018   Lab Results  Component Value Date    PROLACTIN 24.3 (H) 05/26/2018   Lab Results  Component Value Date   CHOL 177 05/26/2018   TRIG 68 05/26/2018   HDL 47 05/26/2018   CHOLHDL 3.8 05/26/2018   VLDL 14 05/26/2018   LDLCALC 116 (H) 05/26/2018   LDLCALC 102 (H) 04/21/2013   Lab Results  Component Value Date   TSH 1.867 05/26/2018   TSH 1.700 10/07/2017    Therapeutic Level Labs: No results found for: LITHIUM Lab Results  Component Value Date   VALPROATE 64 05/26/2018   VALPROATE 37 (L) 10/07/2017   No components found for:  CBMZ  Current Medications: Current Outpatient Medications  Medication Sig Dispense Refill  . acetaminophen (TYLENOL) 325 MG tablet Take 2 tablets (650 mg total) by mouth every 6 (six) hours as needed for mild pain.    Marland Kitchen dimenhyDRINATE (DRAMAMINE) 50 MG tablet Take 25 mg by mouth every 8 (eight) hours as needed for nausea or dizziness.     . divalproex (DEPAKOTE ER) 500 MG 24 hr tablet Take 1 tablet (500 mg total) by mouth daily. For mood 30 tablet 1  . FLUoxetine (PROZAC) 20 MG capsule Take 1 capsule (20 mg total) by mouth daily. For mood 30 capsule 1  . levothyroxine (SYNTHROID, LEVOTHROID) 88 MCG tablet Take 1 tablet (88 mcg total) by mouth daily at 6 (six) AM. For hypothyroidism 30 tablet 0  . LORazepam (ATIVAN) 0.5 MG tablet Take 1 tablet (0.5 mg total) by mouth every 6 (six) hours as needed for anxiety. 60 tablet 1  . losartan-hydrochlorothiazide (HYZAAR) 50-12.5 MG tablet Take 1 tablet by mouth daily. 90 tablet 3  . nebivolol (BYSTOLIC) 2.5 MG tablet Take 1 tablet (2.5 mg total) by mouth daily. For high blood pressure 30 tablet 0  . Papaya Enzyme CHEW Chew 1 tablet by mouth 2 (two) times  daily as needed (indigestion). 1 tablet 0   No current facility-administered medications for this visit.      Musculoskeletal: Strength & Muscle Tone: within normal limits Gait & Station: normal Patient leans: N/A    Psychiatric Specialty Exam: Review of Systems  Constitutional: Negative for  chills, diaphoresis and fever.  Respiratory: Negative for cough, sputum production and shortness of breath.     Blood pressure 114/76, pulse 60, temperature 98.3 F (36.8 C), resp. rate 16, weight 213 lb 6.4 oz (96.8 kg).Body mass index is 37.8 kg/m.  General Appearance: Fairly Groomed  Eye Contact:  Good  Speech:  Clear and Coherent and Normal Rate  Volume:  Normal  Mood:  Depressed  Affect:  Full Range  Thought Process:  Goal Directed and Descriptions of Associations: Intact  Orientation:  Full (Time, Place, and Person)  Thought Content:  Logical  Suicidal Thoughts:  No  Homicidal Thoughts:  No  Memory:  Immediate;   Good  Judgement:  Poor  Insight:  Shallow  Psychomotor Activity:  Normal  Concentration:  Concentration: Fair  Recall:  Good  Fund of Knowledge:  Good  Language:  Good  Akathisia:  No  Handed:  Right  AIMS (if indicated):     Assets:  Communication Skills Desire for Improvement Financial Resources/Insurance Housing Talents/Skills Transportation  ADL's:  Intact  Cognition:  WNL  Sleep:   good         Screenings: AIMS     Admission (Discharged) from OP Visit from 05/25/2018 in [facility]  AIMS Total Score  0    AUDIT     Admission (Discharged) from OP Visit from 05/25/2018 in [facility] [facility] Admission (Discharged) from 04/26/2013 in [place] [facility] Admission (Discharged) from 06/29/2012 in [facility] [facility]  Alcohol Use Disorder Identification Test Final Score (AUDIT)  0  0  0    Mini-Mental     Office Visit from 01/26/2017 in [facility]  Total Score (max 30 points )  28    PHQ2-9     Counselor from 08/06/2015 in [place]  PHQ-2 Total Score  6  PHQ-9 Total Score  26      I reviewed the information below on 06/08/2018 and have updated Assessment and Plan: Bipolar disorder-current  episode depressed, severe and recurrent without psychotic features; GAD; PTSD; cluster B personality disorder    Medication management with supportive therapy. Risks and benefits, side effects and alternative treatment options discussed with patient. Pt was given an opportunity to ask questions about medication, illness, and treatment. All current psychiatric medications have been reviewed and discussed with the patient and adjusted as clinically appropriate. The patient has been provided an accurate and updated list of the medications being now prescribed. Patient expressed understanding of how their medications were to be used.  Pt verbalized understanding and verbal consent obtained for treatment.  The risk of un-intended pregnancy is low based on the fact that pt reports she is post menopausal. Pt is aware that these meds carry a teratogenic risk. Pt will discuss plan of action if she does or plans to become pregnant in the future.  Status of current problems: depression and anxiety have improved  Meds: change Ativan 0.5mg  po BID prn anxiety decrease Depakote ER 500 mg p.o. nightly for bipolar disorder. Pt was not able to tolerate doses above 750mg  D/c Cymbalta  Pt started on Prozac 20mg   po qD for depression   Labs: none  Therapy: brief supportive therapy provided. Discussed psychosocial stressors in detail.   Advised patient on current CDC safety guidelines regarding the pandemic coronavirus.  Patient verbalized understanding.  Consultations: encouraged to follow up with therapist.  Encouraged to follow up with PCP as needed   Pt denies SI and is at an acute low risk for suicide. Patient told to call clinic if any problems occur. Patient advised to go to ER if they should develop SI/HI, side effects, or if symptoms worsen. Has crisis numbers to call if needed. Pt verbalized understanding.  F/up in 6 weeks or sooner if needed.     [name], MD 06/08/2018, 2:37 PM

## 2018-06-12 ENCOUNTER — Ambulatory Visit (HOSPITAL_COMMUNITY): Payer: Medicare Other | Admitting: Licensed Clinical Social Worker

## 2018-06-12 ENCOUNTER — Other Ambulatory Visit: Payer: Self-pay

## 2018-06-21 ENCOUNTER — Other Ambulatory Visit: Payer: Self-pay | Admitting: Pulmonary Disease

## 2018-06-22 ENCOUNTER — Ambulatory Visit: Payer: Self-pay | Admitting: Oncology

## 2018-06-26 ENCOUNTER — Telehealth: Payer: Self-pay | Admitting: Pulmonary Disease

## 2018-06-26 MED ORDER — ROPINIROLE HCL 0.5 MG PO TABS: 0.5000 mg | ORAL_TABLET | Freq: Every day | ORAL | 1 refills | Status: DC

## 2018-06-26 NOTE — Telephone Encounter (Signed)
Medication requip 0.5 refilled. Left patient a message letting her know. Nothing further needed.

## 2018-06-26 NOTE — Telephone Encounter (Signed)
Yes. It looks like Dr. Elsworth Soho refilled it at her last visit. OK to refill.

## 2018-06-26 NOTE — Telephone Encounter (Signed)
Pt request refill on Requip 0.5mg  for RLS. It is not an active medication on her list though she has taken for many years. She feels it may have been taken off in error. Made pt aware since not active on list we would need authorization.    Assessment & Plan Note by Rigoberto Noel, MD at 04/21/2018 1:56 PM  Author: Rigoberto Noel, MD Author Type: Physician Filed: 04/21/2018 1:56 PM  Note Status: Written Cosign: Cosign Not Required Encounter Date: 04/21/2018  Problem: RESTLESS LEGS SYNDROME  Editor: Rigoberto Noel, MD (Physician)    Refill on ropinirole

## 2018-07-20 ENCOUNTER — Encounter (HOSPITAL_COMMUNITY): Payer: Self-pay | Admitting: Psychiatry

## 2018-07-20 ENCOUNTER — Ambulatory Visit (INDEPENDENT_AMBULATORY_CARE_PROVIDER_SITE_OTHER): Payer: Medicare Other | Admitting: Psychiatry

## 2018-07-20 ENCOUNTER — Other Ambulatory Visit: Payer: Self-pay

## 2018-07-20 DIAGNOSIS — F411 Generalized anxiety disorder: Secondary | ICD-10-CM | POA: Diagnosis not present

## 2018-07-20 DIAGNOSIS — F3130 Bipolar disorder, current episode depressed, mild or moderate severity, unspecified: Secondary | ICD-10-CM | POA: Diagnosis not present

## 2018-07-20 DIAGNOSIS — F4310 Post-traumatic stress disorder, unspecified: Secondary | ICD-10-CM

## 2018-07-20 MED ORDER — DIVALPROEX SODIUM ER 500 MG PO TB24: 500.0000 mg | ORAL_TABLET | Freq: Every day | ORAL | 2 refills | Status: DC

## 2018-07-20 MED ORDER — FLUOXETINE HCL 40 MG PO CAPS: 40.0000 mg | ORAL_CAPSULE | Freq: Every day | ORAL | 2 refills | Status: DC

## 2018-07-20 MED ORDER — LORAZEPAM 0.5 MG PO TABS: 0.5000 mg | ORAL_TABLET | Freq: Four times a day (QID) | ORAL | 2 refills | Status: DC | PRN

## 2018-07-20 NOTE — Progress Notes (Signed)
Virtual Visit via Telephone Note  I connected with [name] on 07/20/18 at  2:00 PM EDT by telephone and verified that I am speaking with the correct person using two identifiers.  I discussed the limitations, risks, security and privacy concerns of performing an evaluation and management service by telephone and the availability of in person appointments. I also discussed with the patient that there may be a patient responsible charge related to this service. The patient expressed understanding and agreed to proceed.  Follow    07/20/2018 2:34 PM [name]  MRN:  [id number]  Chief Complaint:  Chief Complaint    Anxiety; Depression     HPI: [name] states she does not feel depressed. Despite that she is does not have much motivation to do much. She is not sleeping well. She takes Ativan but does not sleep for long. She will spend 16 hrs in bed but is tossing and turning and getting in and out of bed. She believes is it because of her PTSD related to her living situation. She is HV. She is not washing or doing much. Pt did find herself singing one day and is trying to make small goals for herself. She thinks the Prozac is helping. Pt denies SI/HI. Pt denies manic and hypomanic. Pt has been able to go out much or move due to the pandemic.   Visit Diagnosis:    ICD-10-CM   1. Bipolar I disorder, most recent episode depressed (HCC) F31.30 divalproex (DEPAKOTE ER) 500 MG 24 hr tablet    FLUoxetine (PROZAC) 40 MG capsule    LORazepam (ATIVAN) 0.5 MG tablet  2. PTSD (post-traumatic stress disorder) F43.10 FLUoxetine (PROZAC) 40 MG capsule    LORazepam (ATIVAN) 0.5 MG tablet  3. GAD (generalized anxiety disorder) F41.1      Past Psychiatric History:  Anxiety:Yes Bipolar Disorder:Yes Depression:Yes Mania:Yes Psychosis:No Schizophrenia:No Personality Disorder:No Hospitalization for psychiatric illness:Yes History of Electroconvulsive Shock Therapy:No Prior  Suicide Attempts:No Previous meds- Rexulti unable to tolerate, Trazodone-unable to tolerate; Pristiq-ineffective, Wellbutrin-ineffective, Lamictal was ok but seemed ineffective; Lithium     Past Medical History:  Past Medical History:  Diagnosis Date  . Abscess of Bartholin's gland   . Acute vestibular neuronitis    Dr Lucia Gaskins  . ADHD (attention deficit hyperactivity disorder)   . Adverse effect of general anesthetic    felt paralyzed while receiving anesthesia  . Anxiety   . Bipolar 1 disorder ([place])   . CAD (coronary artery disease), native coronary artery    cath 05/2016 showing 50-70% stenosis in the mid LAD proximal to the first diagonal and 70-80% small OM1. FFR of LAD  not performed because of difficulty with catheter control from the right radial.   . Chronic kidney disease    kidney cancer- pt states she has elected to not have it treated.  . Depression    Bipolar disorder/goes to [place] [facility] for meds  . Difficult intubation    told by [facility] that she was hard to intubate 15b yrs ago in [place]- surgery since then no problems  . GERD (gastroesophageal reflux disease)   . H/O echocardiogram 07/2012   [place] LVF w grade I siastolic dysfunction   . History of attention deficit disorder   . History of endometriosis   . History of pleural effusion   . Hyperlipidemia LDL goal <70 06/24/2016  . Hypertension   . 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 Memory difficulty 01/26/2017  .  Migraines   . Obesity   . OSA (obstructive sleep apnea)    uses oral appliance instead of CPAP  . PAT (paroxysmal atrial tachycardia) (HCC)    s/p ablation  . PONV (postoperative nausea and vomiting)   . PTSD (post-traumatic stress disorder)   . PVC's (premature ventricular contractions)   . Renal mass 02/15/2012  . RLS (restless legs syndrome)    Dr Gwenette Greet  . rt renal ca dx'd 12/2009   no treatment/ no surg  . Vertigo     Past Surgical History:  Procedure  Laterality Date  . CARDIAC ELECTROPHYSIOLOGY [place] AND ABLATION  2000s  . LEFT HEART CATH AND CORONARY ANGIOGRAPHY N/A 06/08/2016   Procedure: Left Heart Cath and Coronary Angiography;  Surgeon: Belva Crome, MD;  Location: [facility];  Service: Cardiovascular;  Laterality: N/A;  . nasoseptal reconstruction  1990s  . THYROIDECTOMY  1990  . TONSILLECTOMY     as a child  . TUBAL LIGATION  1970s  . uterine mass removal  03/2012   was found to be benign    Family Psychiatric History:  Family History  Problem Relation Age of Onset  . Allergies Father   . Skin cancer Father   . Bipolar disorder Father   . Alcohol abuse Father   . Heart disease Mother   . Depression Mother   . Hypertension Mother   . CAD Mother   . Colon cancer Paternal Grandmother   . OCD Paternal Grandmother   . Allergies Sister        multiple  . Allergies Brother        multiple  . Allergies Daughter   . Heart disease Maternal Grandfather   . Cancer Paternal Grandfather     Social History:  Social History   Socioeconomic History  . Marital status: Single    Spouse name: Not on file  . Number of children: 1  . Years of education: 52  . Highest education level: Not on file  Occupational History  . 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 Financial resource strain: Not on file  . Food insecurity:    Worry: Not on file    Inability: Not on file  . Transportation needs:    Medical: Not on file    Non-medical: Not on file  Tobacco Use  . Smoking status: Never Smoker  . Smokeless tobacco: Never Used  Substance and Sexual Activity  . Alcohol use: Not Currently    Alcohol/week: 1.0 standard drinks    Types: 1 Glasses of wine per week  . Drug use: No  . Sexual activity: Yes    Partners: Male    Birth control/protection: None  Lifestyle  . Physical activity:    Days per week: Not on file    Minutes per session: Not on file  . Stress: Not on file   Relationships  . Social connections:    Talks on phone: Not on file    Gets together: Not on file    Attends religious service: Not on file    Active member of club or organization: Not on file    Attends meetings of clubs or organizations: Not on file    Relationship status: Not on file  Other Topics Concern  . Not on file  Social History Narrative   Divorced and lives alone   Has children   Caffeine use: none   Right handed     Allergies:  Allergies  Allergen Reactions  . Geodon [Ziprasidone Hydrochloride] Other (See Comments)    Extremely agitated  . Lithium Nausea Only and Other (See Comments)    Off balance, increased heart rate  . Talwin [Pentazocine] Other (See Comments)    Hallucinations   . Toradol [Ketorolac Tromethamine] Other (See Comments)    Chest pains  . Ziprasidone Other (See Comments)    Extremely agitated  . Abilify [Aripiprazole] Other (See Comments)    jerking  . Compazine [Prochlorperazine Edisylate] Nausea And Vomiting  . Latuda [Lurasidone Hcl] Other (See Comments)    Reports made her mind race more and irritable   . Cbd [facility]] Other (See Comments)    Tightness in chest when used.  . Pristiq [Desvenlafaxine Succinate Er] Other (See Comments)    Did not work, prefers not to take  . Rexulti [Brexpiprazole] Swelling    Elevated BP, created aggression  . Diflucan [Fluconazole] Nausea And Vomiting    Metabolic Disorder Labs: Lab Results  Component Value Date   HGBA1C 5.5 05/26/2018   MPG 111.15 05/26/2018   Lab Results  Component Value Date   PROLACTIN 24.3 (H) 05/26/2018   Lab Results  Component Value Date   CHOL 177 05/26/2018   TRIG 68 05/26/2018   HDL 47 05/26/2018   CHOLHDL 3.8 05/26/2018   VLDL 14 05/26/2018   LDLCALC 116 (H) 05/26/2018   LDLCALC 102 (H) 04/21/2013   Lab Results  Component Value Date   TSH 1.867 05/26/2018   TSH 1.700 10/07/2017    Therapeutic Level Labs: No results found  for: LITHIUM Lab Results  Component Value Date   VALPROATE 64 05/26/2018   VALPROATE 37 (L) 10/07/2017   No components found for:  CBMZ  Current Medications: Current Outpatient Medications  Medication Sig Dispense Refill  . acetaminophen (TYLENOL) 325 MG tablet Take 2 tablets (650 mg total) by mouth every 6 (six) hours as needed for mild pain.    Marland Kitchen dimenhyDRINATE (DRAMAMINE) 50 MG tablet Take 25 mg by mouth every 8 (eight) hours as needed for nausea or dizziness.     . divalproex (DEPAKOTE ER) 500 MG 24 hr tablet Take 1 tablet (500 mg total) by mouth daily. For mood 30 tablet 2  . FLUoxetine (PROZAC) 40 MG capsule Take 1 capsule (40 mg total) by mouth daily. For mood 30 capsule 2  . levothyroxine (SYNTHROID, LEVOTHROID) 88 MCG tablet Take 1 tablet (88 mcg total) by mouth daily at 6 (six) AM. For hypothyroidism 30 tablet 0  . LORazepam (ATIVAN) 0.5 MG tablet Take 1 tablet (0.5 mg total) by mouth every 6 (six) hours as needed for anxiety. 60 tablet 2  . losartan-hydrochlorothiazide (HYZAAR) 50-12.5 MG tablet Take 1 tablet by mouth daily. 90 tablet 3  . nebivolol (BYSTOLIC) 2.5 MG tablet Take 1 tablet (2.5 mg total) by mouth daily. For high blood pressure 30 tablet 0  . Papaya Enzyme CHEW Chew 1 tablet by mouth 2 (two) times daily as needed (indigestion). 1 tablet 0  . rOPINIRole (REQUIP) 0.5 MG tablet Take 1 tablet (0.5 mg total) by mouth at bedtime. 90 tablet 1   No current facility-administered medications for this visit.      Psychiatric Specialty Exam:   There were no vitals taken for this visit.There is no height or weight on file to calculate BMI.  General Appearance: Unable to comment  Eye Contact: Unable to comment  Speech:  Clear and Coherent and Normal Rate  Volume:  Normal  Mood:  Depressed  Affect:  Congruent  Thought Process:  Coherent and Descriptions of Associations: Circumstantial  Orientation:  Full (Time, Place, and Person)  Thought Content:  Rumination  Suicidal  Thoughts:  No  Homicidal Thoughts:  No  Memory:  Immediate;   Good  Judgement:  Poor  Insight:  Shallow  Psychomotor Activity:  Unable to comment  Concentration:  Concentration: Fair  Recall:  [place] of Knowledge:  Good  Language:  Good  Akathisia:  Unable to comment  Handed:  Right  AIMS (if indicated):     Assets:  Communication Skills Desire for Improvement Housing Talents/Skills  ADL's: Unable to comment  Cognition:  WNL  Sleep:              Screenings: AIMS     Admission (Discharged) from OP Visit from 05/25/2018 in [facility]  AIMS Total Score  0    AUDIT     Admission (Discharged) from OP Visit from 05/25/2018 in [facility] Admission (Discharged) from 04/26/2013 in [facility] Admission (Discharged) from 06/29/2012 in [facility]  Alcohol Use Disorder Identification Test Final Score (AUDIT)  0  0  0    Mini-Mental     Office Visit from 01/26/2017 in [facility]  Total Score (max 30 points )  28    PHQ2-9     Counselor from 08/06/2015 in [facility]  PHQ-2 Total Score  6  PHQ-9 Total Score  26      I reviewed the information below on 07/20/2018 and have updated it Assessment and Plan: Bipolar disorder-current episode depressed, severe and recurrent without psychotic features; GAD; PTSD; cluster B personality disorder    Medication management with supportive therapy. Risks and benefits, side effects and alternative treatment options discussed with patient. Pt was given an opportunity to ask questions about medication, illness, and treatment. All current psychiatric medications have been reviewed and discussed with the patient and adjusted as clinically appropriate. The patient has been provided an accurate and updated list of the medications being now prescribed. Patient expressed  understanding of how their medications were to be used.  Pt verbalized understanding and verbal consent obtained for treatment.  The risk of un-intended pregnancy is low based on the fact that pt reports she is post menopausal. Pt is aware that these meds carry a teratogenic risk. Pt will discuss plan of action if she does or plans to become pregnant in the future.  Status of current problems: mild improvement in depression  Meds: Ativan 0.5mg  po BID prn anxiety Depakote ER 500 mg p.o. nightly for bipolar disorder. Pt was not able to tolerate doses above 750mg  Increase Prozac 40mg  po qD for depression   Labs: none  Therapy: brief supportive therapy provided. Discussed psychosocial stressors in detail.   Advised patient on current CDC safety guidelines regarding the pandemic coronavirus.  Patient verbalized understanding.  Consultations: encouraged to follow up with therapist.  Encouraged to follow up with PCP as needed   Pt denies SI and is at an acute low risk for suicide. Patient told to call clinic if any problems occur. Patient advised to go to ER if they should develop SI/HI, side effects, or if symptoms worsen. Has crisis numbers to call if needed. Pt verbalized understanding.  F/up in 6-8 weeks or sooner if needed.    I provided 20 min of non face  to face time during this encounter  [name], MD 07/20/2018, 2:34 PM

## 2018-07-27 ENCOUNTER — Emergency Department (HOSPITAL_COMMUNITY): Payer: Medicare Other

## 2018-07-27 ENCOUNTER — Emergency Department (HOSPITAL_COMMUNITY)
Admission: EM | Admit: 2018-07-27 | Discharge: 2018-07-27 | Disposition: A | Discharge status: Home / Self Care | Payer: Medicare Other | Attending: Emergency Medicine | Admitting: Emergency Medicine

## 2018-07-27 ENCOUNTER — Other Ambulatory Visit: Payer: Self-pay

## 2018-07-27 ENCOUNTER — Encounter (HOSPITAL_COMMUNITY): Payer: Self-pay | Admitting: Emergency Medicine

## 2018-07-27 DIAGNOSIS — R197 Diarrhea, unspecified: Secondary | ICD-10-CM | POA: Diagnosis not present

## 2018-07-27 DIAGNOSIS — Z85528 Personal history of other malignant neoplasm of kidney: Secondary | ICD-10-CM | POA: Insufficient documentation

## 2018-07-27 DIAGNOSIS — Z79899 Other long term (current) drug therapy: Secondary | ICD-10-CM | POA: Insufficient documentation

## 2018-07-27 DIAGNOSIS — I501 Left ventricular failure: Secondary | ICD-10-CM | POA: Insufficient documentation

## 2018-07-27 DIAGNOSIS — I2510 Atherosclerotic heart disease of native coronary artery without angina pectoris: Secondary | ICD-10-CM | POA: Diagnosis not present

## 2018-07-27 DIAGNOSIS — R1084 Generalized abdominal pain: Secondary | ICD-10-CM

## 2018-07-27 DIAGNOSIS — E860 Dehydration: Secondary | ICD-10-CM

## 2018-07-27 DIAGNOSIS — I110 Hypertensive heart disease with heart failure: Secondary | ICD-10-CM | POA: Diagnosis not present

## 2018-07-27 DIAGNOSIS — E039 Hypothyroidism, unspecified: Secondary | ICD-10-CM | POA: Insufficient documentation

## 2018-07-27 DIAGNOSIS — R112 Nausea with vomiting, unspecified: Secondary | ICD-10-CM | POA: Insufficient documentation

## 2018-07-27 LAB — COMPREHENSIVE METABOLIC PANEL
ALT: 13 U/L (ref 0–44)
AST: 18 U/L (ref 15–41)
Albumin: 4.2 g/dL (ref 3.5–5.0)
Alkaline Phosphatase: 76 U/L (ref 38–126)
Anion gap: 11 (ref 5–15)
BUN: 23 mg/dL (ref 8–23)
CO2: 25 mmol/L (ref 22–32)
Calcium: 9.9 mg/dL (ref 8.9–10.3)
Chloride: 103 mmol/L (ref 98–111)
Creatinine, Ser: 0.98 mg/dL (ref 0.44–1.00)
GFR calc Af Amer: >60 mL/min (ref >60)
GFR calc non Af Amer: 57 mL/min — ABNORMAL LOW (ref >60)
Glucose, Bld: 121 mg/dL — ABNORMAL HIGH (ref 70–99)
Potassium: 3.8 mmol/L (ref 3.5–5.1)
Sodium: 139 mmol/L (ref 135–145)
Total Bilirubin: 0.3 mg/dL (ref 0.3–1.2)
Total Protein: 8.4 g/dL — ABNORMAL HIGH (ref 6.5–8.1)

## 2018-07-27 LAB — CBC WITH DIFFERENTIAL/PLATELET
Abs Immature Granulocytes: 0.03 10*3/uL (ref 0.00–0.07)
Basophils Absolute: 0 10*3/uL (ref 0.0–0.1)
Basophils Relative: 0 %
Eosinophils Absolute: 0 10*3/uL (ref 0.0–0.5)
Eosinophils Relative: 0 %
HCT: 45.9 % (ref 36.0–46.0)
Hemoglobin: 14.6 g/dL (ref 12.0–15.0)
Immature Granulocytes: 0 %
Lymphocytes Relative: 8 %
Lymphs Abs: 0.7 10*3/uL (ref 0.7–4.0)
MCH: 27.8 pg (ref 26.0–34.0)
MCHC: 31.8 g/dL (ref 30.0–36.0)
MCV: 87.4 fL (ref 80.0–100.0)
Monocytes Absolute: 0.8 10*3/uL (ref 0.1–1.0)
Monocytes Relative: 9 %
Neutro Abs: 7.3 10*3/uL (ref 1.7–7.7)
Neutrophils Relative %: 83 %
Platelets: 263 10*3/uL (ref 150–400)
RBC: 5.25 MIL/uL — ABNORMAL HIGH (ref 3.87–5.11)
RDW: 14.5 % (ref 11.5–15.5)
WBC: 8.8 10*3/uL (ref 4.0–10.5)
nRBC: 0 % (ref 0.0–0.2)

## 2018-07-27 LAB — URINALYSIS, ROUTINE W REFLEX MICROSCOPIC
Bilirubin Urine: NEGATIVE
Glucose, UA: NEGATIVE mg/dL
Ketones, ur: NEGATIVE mg/dL
Nitrite: NEGATIVE
Protein, ur: 30 mg/dL — AB
Specific Gravity, Urine: 1.024 (ref 1.005–1.030)
pH: 5 (ref 5.0–8.0)

## 2018-07-27 LAB — C DIFFICILE QUICK SCREEN W PCR REFLEX
C Diff antigen: NEGATIVE
C Diff interpretation: NOT DETECTED
C Diff toxin: NEGATIVE

## 2018-07-27 LAB — LIPASE, BLOOD: Lipase: 33 U/L (ref 11–51)

## 2018-07-27 LAB — VALPROIC ACID LEVEL: Valproic Acid Lvl: 35 ug/mL — ABNORMAL LOW (ref 50.0–100.0)

## 2018-07-27 LAB — TROPONIN I: Troponin I: <0.03 ng/mL (ref <0.03)

## 2018-07-27 MED ORDER — SODIUM CHLORIDE (PF) 0.9 % IJ SOLN
INTRAMUSCULAR | Status: AC
  Administered 2018-07-27: 22:00:00 10 mL
  Filled 2018-07-27: qty 50

## 2018-07-27 MED ORDER — SODIUM CHLORIDE 0.9 % IV BOLUS
500.0000 mL | Freq: Once | INTRAVENOUS | Status: AC
  Administered 2018-07-27: 16:00:00 500 mL via INTRAVENOUS

## 2018-07-27 MED ORDER — ONDANSETRON HCL 4 MG/2ML IJ SOLN
4.0000 mg | INTRAMUSCULAR | Status: AC | PRN
  Administered 2018-07-27 (×2): 4 mg via INTRAVENOUS
  Filled 2018-07-27 (×2): qty 2

## 2018-07-27 MED ORDER — ONDANSETRON 8 MG PO TBDP: 8.0000 mg | ORAL_TABLET | Freq: Three times a day (TID) | ORAL | 0 refills | Status: DC | PRN

## 2018-07-27 MED ORDER — SODIUM CHLORIDE 0.9% FLUSH: 10.0000 mL | INTRAVENOUS | Status: DC | PRN

## 2018-07-27 MED ORDER — IOHEXOL 300 MG/ML  SOLN
100.0000 mL | Freq: Once | INTRAMUSCULAR | Status: AC | PRN
  Administered 2018-07-27: 100 mL via INTRAVENOUS

## 2018-07-27 MED ORDER — LOPERAMIDE HCL 2 MG PO CAPS
4.0000 mg | ORAL_CAPSULE | Freq: Once | ORAL | Status: AC
  Administered 2018-07-27: 4 mg via ORAL
  Filled 2018-07-27: qty 2

## 2018-07-27 MED ORDER — SODIUM CHLORIDE 0.9 % IV SOLN
INTRAVENOUS | Status: DC
  Administered 2018-07-27: 16:00:00 via INTRAVENOUS

## 2018-07-27 MED ORDER — SODIUM CHLORIDE 0.9 % IV BOLUS
500.0000 mL | Freq: Once | INTRAVENOUS | Status: AC
  Administered 2018-07-27: 21:00:00 500 mL via INTRAVENOUS

## 2018-07-27 MED ORDER — SODIUM CHLORIDE 0.9% FLUSH
10.0000 mL | Freq: Two times a day (BID) | INTRAVENOUS | Status: DC
  Administered 2018-07-27: 22:00:00 10 mL

## 2018-07-27 NOTE — ED Notes (Signed)
This RN attempted to start IV w/ no success. Order for IV team has been put in. Spencer RN at bedside attempting ultrasound IV.

## 2018-07-27 NOTE — ED Provider Notes (Signed)
[facility] DEPT Provider Note   CSN: [id number] Arrival date & time: 07/27/18  1340    History   Chief Complaint Chief Complaint  Patient presents with   Emesis   Diarrhea   Abdominal Pain    HPI [name] is a 73 y.o. female.     HPI Pt was seen at 1445.  Per pt, c/o gradual onset and persistence of constant generalized abd "pain" since approximately 10am this morning.  Has been associated with multiple intermittent episodes of N/V/D.  Describes the abd pain as "cramping."  Pt states she took a stool softener yesterday "because I thought I was constipated." Pt states she ate "ice cream and cake" at 3am. Denies cough, known COVID+ exposure. Denies fevers, no back pain, no rash, no CP/SOB, no black or blood in stools or emesis.       Past Medical History:  Diagnosis Date   Abscess of Bartholin's gland    Acute vestibular neuronitis    Dr Lucia Gaskins   ADHD (attention deficit hyperactivity disorder)    Adverse effect of general anesthetic    felt paralyzed while receiving anesthesia   Anxiety    Bipolar 1 disorder ([place])    CAD (coronary artery disease), native coronary artery    cath 05/2016 showing 50-70% stenosis in the mid LAD proximal to the first diagonal and 70-80% small OM1. FFR of LAD  not performed because of difficulty with catheter control from the right radial.    Chronic kidney disease    kidney cancer- pt states she has elected to not have it treated.   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   Memory difficulty 01/26/2017   Migraines    Obesity    OSA (obstructive sleep apnea)    uses oral appliance instead of CPAP   PAT (paroxysmal atrial tachycardia) (HCC)    s/p ablation   PONV (postoperative nausea and vomiting)    PTSD (post-traumatic stress disorder)    PVC's (premature ventricular contractions)    Renal mass 02/15/2012   RLS (restless legs syndrome)    Dr Gwenette Greet   rt renal ca dx'd 12/2009   no treatment/ no surg   Vertigo     Patient Active Problem List   Diagnosis Date Noted   Moderate bipolar I disorder, most recent episode depressed ([place])    MDD (major depressive disorder), severe ([place]) 05/25/2018   PONV (postoperative nausea and vomiting)    Migraines    History of pleural effusion    History of attention deficit disorder    History of endometriosis    GERD (gastroesophageal reflux disease)    Depression    Chronic kidney disease    CAD (coronary artery disease)    Bipolar 1 disorder (HCC)    Anxiety    Adverse effect of general anesthetic    Acute vestibular neuronitis    Abscess of Bartholin's gland  ADHD (attention deficit hyperactivity disorder)    Memory difficulty 01/26/2017   Vertigo 01/26/2017   Hyperlipidemia LDL goal <70 06/24/2016   GAD (generalized anxiety disorder) 06/03/2015   PTSD (post-traumatic stress disorder) 01/01/2014   Bipolar I disorder, most recent episode depressed ([place]) 01/01/2014   Major depressive disorder, recurrent severe without psychotic features ([place]) 09/25/2013   PVC's (premature ventricular contractions) 24/40/1027   Diastolic dysfunction, left ventricle 05/31/2013   PAT (paroxysmal atrial tachycardia) ([place]) 05/31/2013   Essential hypertension, benign 05/31/2013   Obesity 05/31/2013   Recurrent major depression-severe ([place]) 04/27/2013   H/O echocardiogram 07/20/2012   Generalized anxiety  disorder 06/30/2012   Renal mass 02/15/2012   GALLSTONES 12/31/2009   ATTENTION DEFICIT DISORDER 02/20/2007   Obstructive sleep apnea 02/20/2007   RESTLESS LEGS SYNDROME 02/20/2007   Hypothyroidism 03/22/1988    Past Surgical History:  Procedure Laterality Date   CARDIAC ELECTROPHYSIOLOGY MAPPING AND ABLATION  2000s   LEFT HEART CATH AND CORONARY ANGIOGRAPHY N/A 06/08/2016   Procedure: Left Heart Cath and Coronary Angiography;  Surgeon: Belva Crome, MD;  Location: [facility];  Service: Cardiovascular;  Laterality: N/A;   nasoseptal reconstruction  [place]     as a child   TUBAL LIGATION  1970s   uterine mass removal  03/2012   was found to be benign     OB History   No obstetric history on file.      Home Medications    Prior to Admission medications   Medication Sig Start Date End Date Taking? Authorizing Provider  acetaminophen (TYLENOL) 325 MG tablet Take 2 tablets (650 mg total) by mouth every 6 (six) hours as needed for mild pain. 05/30/18   Connye Burkitt, NP  dimenhyDRINATE (DRAMAMINE) 50 MG tablet Take 25 mg by mouth every 8 (eight) hours as needed for nausea or dizziness.     [provider]  divalproex (DEPAKOTE ER) 500 MG 24 hr tablet Take 1 tablet (500 mg total) by mouth daily. For mood 07/20/18   [name], MD  FLUoxetine (PROZAC) 40 MG capsule Take 1 capsule (40 mg total) by mouth daily. For mood 07/20/18   [name], MD  levothyroxine (SYNTHROID, LEVOTHROID) 88 MCG tablet Take 1 tablet (88 mcg total) by mouth daily at 6 (six) AM. For hypothyroidism 05/31/18   Connye Burkitt, NP  LORazepam (ATIVAN) 0.5 MG tablet Take 1 tablet (0.5 mg total) by mouth every 6 (six) hours as needed for anxiety. 07/20/18   [name], MD  losartan-hydrochlorothiazide [facility]) 50-12.5 MG tablet Take 1 tablet by mouth daily. 01/19/18 01/19/19  Sueanne Margarita, MD  nebivolol (BYSTOLIC) 2.5 MG tablet Take 1  tablet (2.5 mg total) by mouth daily. For high blood pressure 05/31/18   Connye Burkitt, NP  Papaya Enzyme CHEW Chew 1 tablet by mouth 2 (two) times daily as needed (indigestion). 05/30/18   Connye Burkitt, NP  rOPINIRole (REQUIP) 0.5 MG tablet Take 1 tablet (0.5 mg total) by mouth at bedtime. 06/26/18   Fenton Foy, NP    Family History Family History  Problem Relation Age of Onset   Allergies Father    Skin cancer Father    Bipolar disorder Father    Alcohol abuse Father    Heart disease Mother    Depression Mother    Hypertension Mother    CAD Mother    Colon cancer Paternal Grandmother    OCD Paternal  Grandmother    Allergies Sister        multiple   Allergies Brother        multiple   Allergies Daughter    Heart disease Maternal Grandfather    Cancer Paternal Grandfather     Social History Social History   Tobacco Use   Smoking status: Never Smoker   Smokeless tobacco: Never Used  Substance Use Topics   Alcohol use: Not Currently    Alcohol/week: 1.0 standard drinks    Types: 1 Glasses of wine per week   Drug use: No     Allergies   Geodon [ziprasidone hydrochloride]; Lithium; Talwin [pentazocine]; Toradol [ketorolac tromethamine]; Ziprasidone; Abilify [aripiprazole]; Compazine [prochlorperazine edisylate]; Latuda [lurasidone hcl]; Cbd kings [lido-menthol-methyl sal-camph]; Pristiq [desvenlafaxine succinate er]; Rexulti [brexpiprazole]; and Diflucan [fluconazole]   Review of Systems Review of Systems ROS: Statement: All systems negative except as marked or noted in the HPI; Constitutional: Negative for fever and chills. ; ; Eyes: Negative for eye pain, redness and discharge. ; ; ENMT: Negative for ear pain, hoarseness, nasal congestion, sinus pressure and sore throat. ; ; Cardiovascular: Negative for chest pain, palpitations, diaphoresis, dyspnea and peripheral edema. ; ; Respiratory: Negative for cough, wheezing and stridor. ; ;  Gastrointestinal: +N/V/D, abd pain. Negative for blood in stool, hematemesis, jaundice and rectal bleeding. . ; ; Genitourinary: Negative for dysuria, flank pain and hematuria. ; ; Musculoskeletal: Negative for back pain and neck pain. Negative for swelling and trauma.; ; Skin: Negative for pruritus, rash, abrasions, blisters, bruising and skin lesion.; ; Neuro: Negative for headache, lightheadedness and neck stiffness. Negative for weakness, altered level of consciousness, altered mental status, extremity weakness, paresthesias, involuntary movement, seizure and syncope.       Physical Exam Updated Vital Signs BP 127/77 (BP Location: Right Arm)    Pulse 82    Temp (!) 97.4 F (36.3 C) (Oral)    Resp 20    Ht 5\' 3"  (1.6 m)    Wt 90.7 kg    SpO2 98%    BMI 35.43 kg/m   Physical Exam 1450: Physical examination:  Nursing notes reviewed; Vital signs and O2 SAT reviewed;  Constitutional: Well developed, Well nourished, In no acute distress; Head:  Normocephalic, atraumatic; Eyes: EOMI, PERRL, No scleral icterus; ENMT: Mouth and pharynx normal, Mucous membranes dry; Neck: Supple, Full range of motion, No lymphadenopathy; Cardiovascular: Regular rate and rhythm, No gallop; Respiratory: Breath sounds clear & equal bilaterally, No wheezes.  Speaking full sentences with ease, Normal respiratory effort/excursion; Chest: Nontender, Movement normal; Abdomen: Soft, +diffuse tenderness to palp. No rebound or guarding. Nondistended, Normal bowel sounds; Genitourinary: No CVA tenderness; Extremities: Peripheral pulses normal, No tenderness, No edema, No calf edema or asymmetry.; Neuro: AA&Ox3, Major CN grossly intact.  Speech clear. No gross focal motor or sensory deficits in extremities.; Skin: Color normal, Warm, Dry.      ED Treatments / Results  Labs (all labs ordered are listed, but only abnormal results are displayed)   EKG EKG Interpretation  Date/Time:  Thursday Jul 27 2018 15:34:47  EDT Ventricular Rate:  69 PR Interval:    QRS Duration: 104 QT Interval:  405 QTC Calculation: 434 R Axis:   60 Text Interpretation:  Sinus rhythm Consider right atrial enlargement Abnormal R-wave progression, early transition Baseline wander Artifact When compared with ECG of 10/14/2017 No significant change was found Reconfirmed by Francine Graven [phone]) on 07/27/2018 3:46:48 PM   Radiology   Procedures Procedures (including critical care time)  Medications Ordered in ED Medications  sodium chloride 0.9 % bolus 500 mL (has no administration in time range)  0.9 %  sodium chloride infusion (has no administration in time range)     Initial Impression / Assessment and Plan / ED Course  I have reviewed the triage vital signs and the nursing notes.  Pertinent labs & imaging results that were available during my care of the patient were reviewed by me and considered in my medical decision making (see chart for details).     MDM Reviewed: previous chart, nursing note and vitals Reviewed previous: labs and ECG Interpretation: labs, ECG, x-ray and CT scan    Results for orders placed or performed during the hospital encounter of 07/27/18  Comprehensive metabolic panel  Result Value Ref Range   Sodium 139 135 - 145 mmol/L   Potassium 3.8 3.5 - 5.1 mmol/L   Chloride 103 98 - 111 mmol/L   CO2 25 22 - 32 mmol/L   Glucose, Bld 121 (H) 70 - 99 mg/dL   BUN 23 8 - 23 mg/dL   Creatinine, Ser 0.98 0.44 - 1.00 mg/dL   Calcium 9.9 8.9 - 10.3 mg/dL   Total Protein 8.4 (H) 6.5 - 8.1 g/dL   Albumin 4.2 3.5 - 5.0 g/dL   AST 18 15 - 41 U/L   ALT 13 0 - 44 U/L   Alkaline Phosphatase 76 38 - 126 U/L   Total Bilirubin 0.3 0.3 - 1.2 mg/dL   GFR calc non Af Amer 57 (L) >60 mL/min   GFR calc Af Amer >60 >60 mL/min   Anion gap 11 5 - 15  Lipase, blood  Result Value Ref Range   Lipase 33 11 - 51 U/L  Troponin I - Once  Result Value Ref Range   Troponin I <0.03 <0.03 ng/mL  CBC with  Differential  Result Value Ref Range   WBC 8.8 4.0 - 10.5 K/uL   RBC 5.25 (H) 3.87 - 5.11 MIL/uL   Hemoglobin 14.6 12.0 - 15.0 g/dL   HCT 45.9 36.0 - 46.0 %   MCV 87.4 80.0 - 100.0 fL   MCH 27.8 26.0 - 34.0 pg   MCHC 31.8 30.0 - 36.0 g/dL   RDW 14.5 11.5 - 15.5 %   Platelets 263 150 - 400 K/uL   nRBC 0.0 0.0 - 0.2 %   Neutrophils Relative % 83 %   Neutro Abs 7.3 1.7 - 7.7 K/uL   Lymphocytes Relative 8 %   Lymphs Abs 0.7 0.7 - 4.0 K/uL   Monocytes Relative 9 %   Monocytes Absolute 0.8 0.1 - 1.0 K/uL   Eosinophils Relative 0 %   Eosinophils Absolute 0.0 0.0 - 0.5 K/uL   Basophils Relative 0 %   Basophils Absolute 0.0 0.0 - 0.1 K/uL   Immature Granulocytes 0 %   Abs Immature Granulocytes 0.03 0.00 - 0.07 K/uL   Dg Chest 2 View Result Date: 07/27/2018 CLINICAL DATA:  Nausea vomiting and diarrhea. EXAM: CHEST - 2 VIEW COMPARISON:  10/14/2017 FINDINGS: The heart size and mediastinal contours are within normal limits. Both lungs are clear. The visualized skeletal structures are unremarkable. IMPRESSION: No active cardiopulmonary disease. Electronically Signed   By: Misty Stanley M.D.   On: 07/27/2018 15:30    1720:  CT and UA pending. Stool studies and OB testing pending results. Sign out to Dr. Venora Maples.      Final Clinical Impressions(s) / ED Diagnoses   Final diagnoses:  None  ED Discharge Orders    None       Francine Graven, DO 07/27/18 1725

## 2018-07-27 NOTE — ED Triage Notes (Signed)
Patient arrived by EMS from home. Pt c/o N/V/D starting last night. EMS reports dry heaves.   Hx of gallstones, kidney cancer, and bipolar per EMS.   EMS VS BP 138/80, HR 92, RR 18, SpO2 99% on RA.

## 2018-07-27 NOTE — ED Notes (Signed)
Patient transported to CT 

## 2018-07-27 NOTE — ED Notes (Signed)
Bed: VD73 Expected date:  Expected time:  Means of arrival:  Comments: EMS-N/V

## 2018-07-27 NOTE — ED Provider Notes (Signed)
8:39 PM Pt feels better. Still some diarrhea but nausea and vomiting has resolved. Additional 500cc NS given in ER. CT reassuring without obvious acute process. Known right renal neoplasm discussed with pt. She has had many office visits with Dr Alen Blew and continues to now want any treatment at this time. She understands that it is larger today.  Close pcp follow up next week. Home with zofran after IVFs finished. Encouraged to return to the ER for new or worsening symptoms   Jola Schmidt, MD 07/27/18 2041

## 2018-07-28 LAB — GASTROINTESTINAL PANEL BY PCR, STOOL (REPLACES STOOL CULTURE)

## 2018-07-28 LAB — URINE CULTURE

## 2018-08-01 ENCOUNTER — Ambulatory Visit (INDEPENDENT_AMBULATORY_CARE_PROVIDER_SITE_OTHER): Payer: Medicare Other | Admitting: Licensed Clinical Social Worker

## 2018-08-01 ENCOUNTER — Other Ambulatory Visit: Payer: Self-pay

## 2018-08-01 DIAGNOSIS — F3130 Bipolar disorder, current episode depressed, mild or moderate severity, unspecified: Secondary | ICD-10-CM | POA: Diagnosis not present

## 2018-08-01 NOTE — Progress Notes (Signed)
Virtual Visit via Telephone Note  I connected with [name] on 08/01/18 at  4:00 PM EDT by telephone and verified that I am speaking with the correct person using two identifiers.   I discussed the limitations, risks, security and privacy concerns of performing an evaluation and management service by telephone and the availability of in person appointments. I also discussed with the patient that there may be a patient responsible charge related to this service. The patient expressed understanding and agreed to proceed.   I discussed the assessment and treatment plan with the patient. The patient was provided an opportunity to ask questions and all were answered. The patient agreed with the plan and demonstrated an understanding of the instructions.   The patient was advised to call back or seek an in-person evaluation if the symptoms worsen or if the condition fails to improve as anticipated.  I provided 45 minutes of non-face-to-face time during this encounter.   Olegario Messier, LCSW    THERAPIST PROGRESS NOTE  Session Time: 4:05pm-4:35pm  Participation Level: Active  Behavioral Response: NAAlertEuthymic  Type of Therapy: Individual Therapy  Treatment Goals addressed: Coping  Interventions: CBT, Motivational Interviewing and Supportive  Summary: [name] is a 73 y.o. female who presents with bipolar disorder and generalized anxiety. Client shared concerns with changes in health including tumor growing and trouble with kidney functioning. Client is interested in a stimulant "like they use for ADHD" to help with motivation, which she is still having trouble with. Client endorses a more stable mood overall, and is able to get up and accomplish things during the day rather than remaining in bed. Client reports positive interaction with her sisters over the weekend which was a 'pleasant surprise' and was able to remind herself to be mindful of boundaries and expectations  with new relationships. Client also notes that she feels with her medical changes she is getting 'closer to death," though is not actively looking to die. Client continues to engage in [place] making which helps her focus and improves mood by making her feel productive.  Suicidal/Homicidal: Nowithout intent/plan  Therapist Response: Clinician met with client via telehealth due to pandemic to maintain health and safety recommendations. Clinician assessed for SI/HI/psychosis and overall lelvel of functioning. Clinician inquired about recent stressors and coping skills used since hospitalization. Clinician praised client use of routine to help avoid staying in bed all day. Clinician validated client feelings or worry related to pandemic and desire to continue to implement safety measures as long as she felt comfortable.  Plan: Return again in 2-3 weeks.  Diagnosis: Axis I: Bipolar, mixed    Axis II: Borderline Personality Dis.    Olegario Messier, LCSW 08/01/2018

## 2018-08-15 ENCOUNTER — Other Ambulatory Visit (HOSPITAL_COMMUNITY): Payer: Self-pay | Admitting: Psychiatry

## 2018-08-15 DIAGNOSIS — F4310 Post-traumatic stress disorder, unspecified: Secondary | ICD-10-CM

## 2018-08-15 DIAGNOSIS — F3130 Bipolar disorder, current episode depressed, mild or moderate severity, unspecified: Secondary | ICD-10-CM

## 2018-08-16 ENCOUNTER — Ambulatory Visit (HOSPITAL_COMMUNITY): Payer: Medicare Other | Admitting: Licensed Clinical Social Worker

## 2018-09-12 ENCOUNTER — Telehealth: Payer: Self-pay | Admitting: Cardiology

## 2018-09-12 NOTE — Telephone Encounter (Signed)
STAT if HR is under 50 or over 120 (normal HR is 60-100 beats per minute)  1) What is your heart rate? 54- she wants to know if she should continue taking her  Bystolic?  2) Do you have a log of your heart rate readings (document readings)?off and on she keeps it=-- She is going to start doing it every day  3) Do you have any other symptoms? Very fatigued, SOB sometimes

## 2018-09-12 NOTE — Telephone Encounter (Signed)
LVM for return call. 

## 2018-09-14 NOTE — Telephone Encounter (Signed)
Attempted voicemail box is full, sending MyChart message.

## 2018-09-18 NOTE — Telephone Encounter (Signed)
Spoke with the patient, she requested a visit with Dr. Radford Pax. She did not want to address it over the phone. She wanted to discuss her blood pressure medications.

## 2018-09-28 ENCOUNTER — Encounter (HOSPITAL_COMMUNITY): Payer: Self-pay | Admitting: Psychiatry

## 2018-09-28 ENCOUNTER — Other Ambulatory Visit: Payer: Self-pay

## 2018-09-28 ENCOUNTER — Ambulatory Visit (INDEPENDENT_AMBULATORY_CARE_PROVIDER_SITE_OTHER): Payer: Medicare Other | Admitting: Psychiatry

## 2018-09-28 DIAGNOSIS — F411 Generalized anxiety disorder: Secondary | ICD-10-CM | POA: Diagnosis not present

## 2018-09-28 DIAGNOSIS — F4310 Post-traumatic stress disorder, unspecified: Secondary | ICD-10-CM | POA: Diagnosis not present

## 2018-09-28 DIAGNOSIS — F3130 Bipolar disorder, current episode depressed, mild or moderate severity, unspecified: Secondary | ICD-10-CM

## 2018-09-28 MED ORDER — DIVALPROEX SODIUM ER 250 MG PO TB24: 750.0000 mg | ORAL_TABLET | Freq: Every day | ORAL | 0 refills | Status: DC

## 2018-09-28 MED ORDER — LORAZEPAM 0.5 MG PO TABS: 0.5000 mg | ORAL_TABLET | Freq: Four times a day (QID) | ORAL | 0 refills | Status: DC | PRN

## 2018-09-28 NOTE — Progress Notes (Signed)
Virtual Visit via Telephone Note  I connected with [name] on 09/28/18 at  3:00 PM EDT by telephone and verified that I am speaking with the correct person using two identifiers.  Location: Patient: home Provider: office   I discussed the limitations, risks, security and privacy concerns of performing an evaluation and management service by telephone and the availability of in person appointments. I also discussed with the patient that there may be a patient responsible charge related to this service. The patient expressed understanding and agreed to proceed.   History of Present Illness:  Pt reports she is concerned that she is doesn't care about anything right now. It has never gotten this bad. She is not keeping up with her home and has no motivation to do anything. Pt has passive thoughts of death. She denies SI/HI. She is not scared to die. Pt has started giving some of her jewelry to her sisters. Pt knows it is a sign of suicide but states that she just wants to share it with them. It is not in preparation for suicidal action.  She is looking for a new place to move in on her own.  [name] has been having trouble with and talked about going to couples counseling.  He is agreeable to the idea.   Observations/Objective: I spoke with [name] on the phone.  Pt was cooperative.  Pt was engaged in the conversation and answered questions appropriately.  Speech was coherent with increased rate.  Speech was with normal tone and volume.  Mood is depressed and anxious, affect is congruent. Thought processes are coherent and circumstantial.  Thought content is with ruminations.  Pt denies SI/HI.   Pt denies auditory and visual hallucinations and did not appear to be responding to internal stimuli.  Memory and concentration are good.  Fund of knowledge and use of language are average.  Insight and judgment are fair.  I am unable to comment on psychomotor activity, general appearance,  hygiene, or eye contact as I was unable to physically see the patient on the phone.  Vital signs not available since interview conducted virtually.    Assessment and Plan: Bipolar I d/o- depressed, severe without psychotic features; GAD; PTSD; cluster B personality d/o  Increase Depakote ER 750mg  po qPM- 07/27/2018 depakote level 35  D/c Prozac  Ativan 0.5mg  po BID prn anxiety  Pt infrequently meeting with therapist due to insurance issues. She is planning on finding a new therapist who is covered by her insurance so that she can see someone every week.   Pt has been working with a therapist who is trying to help her ADHD. Therapist recommended she take Thyrofine, vit D and Vit b12   Suicide risk assessment: Demographic Factors:  Age 49 or older, Caucasian, Low socioeconomic status and Unemployed  Loss Factors: NA  Historical Factors: Family history of mental illness or substance abuse and Impulsivity  Risk Reduction Factors:   Living with another person, especially a relative, Positive social support and Positive therapeutic relationship  Continued Clinical Symptoms:  Bipolar Disorder:   Depressive phase Personality Disorders:   Cluster B  Cognitive Features That Contribute To Risk:  None    Suicide Risk:  Mild:  Suicidal ideation of limited frequency, intensity, duration, and specificity.  There are no identifiable plans, no associated intent, mild dysphoria and related symptoms, good self-control (both objective and subjective assessment), few other risk factors, and identifiable protective factors, including available and accessible social support.  Follow Up Instructions: In 1 weeks or sooner if needed   I discussed the assessment and treatment plan with the patient. The patient was provided an opportunity to ask questions and all were answered. The patient agreed with the plan and demonstrated an understanding of the instructions.   The patient was advised to call  back or seek an in-person evaluation if the symptoms worsen or if the condition fails to improve as anticipated.  I provided 30 minutes of non-face-to-face time during this encounter.   [name], MD

## 2018-10-05 ENCOUNTER — Ambulatory Visit (INDEPENDENT_AMBULATORY_CARE_PROVIDER_SITE_OTHER): Payer: Medicare Other | Admitting: Psychiatry

## 2018-10-05 ENCOUNTER — Encounter (HOSPITAL_COMMUNITY): Payer: Self-pay | Admitting: Psychiatry

## 2018-10-05 ENCOUNTER — Other Ambulatory Visit: Payer: Self-pay

## 2018-10-05 DIAGNOSIS — F3130 Bipolar disorder, current episode depressed, mild or moderate severity, unspecified: Secondary | ICD-10-CM

## 2018-10-05 DIAGNOSIS — F411 Generalized anxiety disorder: Secondary | ICD-10-CM

## 2018-10-05 DIAGNOSIS — F314 Bipolar disorder, current episode depressed, severe, without psychotic features: Secondary | ICD-10-CM

## 2018-10-05 DIAGNOSIS — F4310 Post-traumatic stress disorder, unspecified: Secondary | ICD-10-CM

## 2018-10-05 DIAGNOSIS — F6089 Other specific personality disorders: Secondary | ICD-10-CM

## 2018-10-05 MED ORDER — DIVALPROEX SODIUM ER 500 MG PO TB24: 1000.0000 mg | ORAL_TABLET | Freq: Every day | ORAL | 0 refills | Status: DC

## 2018-10-05 MED ORDER — LORAZEPAM 0.5 MG PO TABS: 0.5000 mg | ORAL_TABLET | Freq: Two times a day (BID) | ORAL | 0 refills | Status: DC | PRN
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
## 2018-10-06 ENCOUNTER — Telehealth: Payer: Self-pay | Admitting: Oncology

## 2018-10-06 NOTE — Telephone Encounter (Signed)
Call day 8/19 moved appointments from 8/19 to 8/26. Left message. Schedule mailed re appointment for 8/26 at 3 pm.

## 2018-10-08 ENCOUNTER — Other Ambulatory Visit (HOSPITAL_COMMUNITY): Payer: Self-pay | Admitting: Psychiatry

## 2018-10-08 DIAGNOSIS — F3130 Bipolar disorder, current episode depressed, mild or moderate severity, unspecified: Secondary | ICD-10-CM

## 2018-10-08 DIAGNOSIS — F4310 Post-traumatic stress disorder, unspecified: Secondary | ICD-10-CM

## 2018-10-16 ENCOUNTER — Ambulatory Visit: Payer: Medicare Other | Admitting: Cardiology

## 2018-10-19 ENCOUNTER — Encounter (HOSPITAL_COMMUNITY): Payer: Self-pay | Admitting: Psychiatry

## 2018-10-19 ENCOUNTER — Ambulatory Visit (INDEPENDENT_AMBULATORY_CARE_PROVIDER_SITE_OTHER): Payer: Medicare Other | Admitting: Psychiatry

## 2018-10-19 ENCOUNTER — Other Ambulatory Visit: Payer: Self-pay

## 2018-10-19 DIAGNOSIS — F411 Generalized anxiety disorder: Secondary | ICD-10-CM | POA: Diagnosis not present

## 2018-10-19 DIAGNOSIS — F3130 Bipolar disorder, current episode depressed, mild or moderate severity, unspecified: Secondary | ICD-10-CM | POA: Diagnosis not present

## 2018-10-19 DIAGNOSIS — F4310 Post-traumatic stress disorder, unspecified: Secondary | ICD-10-CM | POA: Diagnosis not present

## 2018-10-19 DIAGNOSIS — Z5181 Encounter for therapeutic drug level monitoring: Secondary | ICD-10-CM | POA: Diagnosis not present

## 2018-10-19 MED ORDER — LORAZEPAM 0.5 MG PO TABS: 0.5000 mg | ORAL_TABLET | Freq: Two times a day (BID) | ORAL | 0 refills | Status: DC | PRN

## 2018-10-19 NOTE — Progress Notes (Signed)
Virtual Visit via Telephone Note  I connected with [name] on 10/19/18 at  7:45 AM EDT by telephone and verified that I am speaking with the correct person using two identifiers.  Location: Patient: Home Provider: Office   I discussed the limitations, risks, security and privacy concerns of performing an evaluation and management service by telephone and the availability of in person appointments. I also discussed with the patient that there may be a patient responsible charge related to this service. The patient expressed understanding and agreed to proceed.   History of Present Illness: "I am pretty good this morning". She is sitting outside reading the paper.  For the last 2 weeks most mornings are better. She is waking up earlier. She is a little more motivated and is doing some things during the day. [name] feels like she is now 25% better as compared 10-15% better. Her appetite is improved and is eating more and enjoying the food. [name] is crying less frequently. She has starting to engage in her hobbies again and is enjoying it. Pt still has no desire to social or communicate with anyone. She has not even talked to her sisters. Her fiance is leaving for 1 week next week. She is looking forward to having the week to herself and wants to evaluate her mood while he is gone. She has been more patient and less irritable with his behaviors lately. [name] has been able to open up and talk to her fiance more over the last couple of weeks which helps a lot. Her anxiety is high but not as intense as before. Pt has able to tolerate going out even though she does not want to.  She is having very vivid nightmares that make her physically sick. Pt is not being as hard on herself as before. She is taking time to herself and does things to engage in self care. Pt shares that "the inside of my head feels bad. Stuff is moving around in there and it makes me crazy. There is so much going on in my head and  I don't know how to handle it. It is making me crazy. But I haven't called any crisis center. I need to find a therapist". When it gets really bad she tries to distract herself. Pt has been watching youtube videos about bipolar d/o. She has bought some mindfulness books that she wants to start reading soon. The mental health association is starting zoom sessions again but she has not started it yet. She is not really into computer stuff and wants to learn how to get onto soon and other video platforms. "I did 2 weeks good. I didn't need that umbilical cord to you".  She states that her depression is very slowly improving and she is happy with the signs of change that she has been noticing.  She has been taking her Ativan 4 times a day and states that it does help to decrease the intensity of her anxiety.  She denies SI/HI.  She denies manic and hypomanic-like symptoms.  Overall her PTSD is unchanged.   Observations/Objective: I spoke with [name] on the phone.  Pt was calm, pleasant and cooperative.  Pt was engaged in the conversation and answered questions appropriately.  Speech was clear and coherent with normal rate, tone and volume.  Mood is depressed and anxious, affect is congruent but significantly calmer and brighter, less distressed than previous visits. Thought processes are coherent and circumstantial, as per her usual.  Thought content is with logical.  Pt denies SI/HI.   Pt denies auditory and visual hallucinations and did not appear to be responding to internal stimuli.  Memory and concentration are good.  Fund of knowledge and use of language are average.  Insight and judgment are fair.  I am unable to comment on psychomotor activity, general appearance, hygiene, or eye contact as I was unable to physically see the patient on the phone.  Vital signs not available since interview conducted virtually.    Assessment and Plan: Bipolar 1 disorder-depressed, severe without psychotic  features; GAD; PTSD; cluster B personality disorder  Depakote ER 1000 mg p.o. every afternoon  Ativan 0.5 mg p.o. twice daily as needed anxiety  I have ordered Depakote level, LFTs and CBC  Patient has not yet tried to find a new therapist covered by her insurance.  Patient was informed by the mental health Association that they have restarted sessions through Zoom and patient is interested in learning how to use doom so that she can attend virtually.   Follow Up Instructions: In 4 to 5 weeks or sooner if needed   I discussed the assessment and treatment plan with the patient. The patient was provided an opportunity to ask questions and all were answered. The patient agreed with the plan and demonstrated an understanding of the instructions.   The patient was advised to call back or seek an in-person evaluation if the symptoms worsen or if the condition fails to improve as anticipated.  I provided 20 minutes of non-face-to-face time during this encounter.   [name], MD

## 2018-10-26 ENCOUNTER — Other Ambulatory Visit (HOSPITAL_COMMUNITY): Payer: Self-pay | Admitting: Psychiatry

## 2018-10-26 DIAGNOSIS — F3130 Bipolar disorder, current episode depressed, mild or moderate severity, unspecified: Secondary | ICD-10-CM

## 2018-10-31 ENCOUNTER — Other Ambulatory Visit: Payer: Self-pay

## 2018-10-31 ENCOUNTER — Emergency Department (HOSPITAL_COMMUNITY): Payer: Medicare Other

## 2018-10-31 ENCOUNTER — Encounter (HOSPITAL_COMMUNITY): Payer: Self-pay

## 2018-10-31 ENCOUNTER — Emergency Department (HOSPITAL_COMMUNITY)
Admission: EM | Admit: 2018-10-31 | Discharge: 2018-11-01 | Disposition: A | Discharge status: Home / Self Care | Payer: Medicare Other | Attending: Emergency Medicine | Admitting: Emergency Medicine

## 2018-10-31 DIAGNOSIS — R51 Headache: Secondary | ICD-10-CM | POA: Diagnosis not present

## 2018-10-31 DIAGNOSIS — R0602 Shortness of breath: Secondary | ICD-10-CM | POA: Diagnosis not present

## 2018-10-31 DIAGNOSIS — N189 Chronic kidney disease, unspecified: Secondary | ICD-10-CM | POA: Insufficient documentation

## 2018-10-31 DIAGNOSIS — I259 Chronic ischemic heart disease, unspecified: Secondary | ICD-10-CM | POA: Diagnosis not present

## 2018-10-31 DIAGNOSIS — Z79899 Other long term (current) drug therapy: Secondary | ICD-10-CM | POA: Insufficient documentation

## 2018-10-31 DIAGNOSIS — E890 Postprocedural hypothyroidism: Secondary | ICD-10-CM | POA: Diagnosis not present

## 2018-10-31 DIAGNOSIS — I5030 Unspecified diastolic (congestive) heart failure: Secondary | ICD-10-CM | POA: Insufficient documentation

## 2018-10-31 DIAGNOSIS — Z20828 Contact with and (suspected) exposure to other viral communicable diseases: Secondary | ICD-10-CM | POA: Insufficient documentation

## 2018-10-31 DIAGNOSIS — I130 Hypertensive heart and chronic kidney disease with heart failure and stage 1 through stage 4 chronic kidney disease, or unspecified chronic kidney disease: Secondary | ICD-10-CM | POA: Diagnosis not present

## 2018-10-31 DIAGNOSIS — R05 Cough: Secondary | ICD-10-CM | POA: Diagnosis not present

## 2018-10-31 LAB — BASIC METABOLIC PANEL
Anion gap: 9 (ref 5–15)
BUN: 18 mg/dL (ref 8–23)
CO2: 25 mmol/L (ref 22–32)
Calcium: 8.8 mg/dL — ABNORMAL LOW (ref 8.9–10.3)
Chloride: 103 mmol/L (ref 98–111)
Creatinine, Ser: 0.77 mg/dL (ref 0.44–1.00)
GFR calc Af Amer: >60 mL/min (ref >60)
GFR calc non Af Amer: >60 mL/min (ref >60)
Glucose, Bld: 82 mg/dL (ref 70–99)
Potassium: 3.8 mmol/L (ref 3.5–5.1)
Sodium: 137 mmol/L (ref 135–145)

## 2018-10-31 LAB — CBC
HCT: 36 % (ref 36.0–46.0)
Hemoglobin: 11.5 g/dL — ABNORMAL LOW (ref 12.0–15.0)
MCH: 28.1 pg (ref 26.0–34.0)
MCHC: 31.9 g/dL (ref 30.0–36.0)
MCV: 88 fL (ref 80.0–100.0)
Platelets: 229 10*3/uL (ref 150–400)
RBC: 4.09 MIL/uL (ref 3.87–5.11)
RDW: 15.2 % (ref 11.5–15.5)
WBC: 8.5 10*3/uL (ref 4.0–10.5)
nRBC: 0 % (ref 0.0–0.2)

## 2018-10-31 LAB — D-DIMER, QUANTITATIVE: D-Dimer, Quant: 1.19 ug/mL-FEU — ABNORMAL HIGH (ref 0.00–0.50)

## 2018-10-31 LAB — BRAIN NATRIURETIC PEPTIDE: B Natriuretic Peptide: 81.4 pg/mL (ref 0.0–100.0)

## 2018-10-31 LAB — SARS CORONAVIRUS 2 BY RT PCR (HOSPITAL ORDER, PERFORMED IN ~~LOC~~ HOSPITAL LAB): SARS Coronavirus 2: NEGATIVE

## 2018-10-31 MED ORDER — SODIUM CHLORIDE (PF) 0.9 % IJ SOLN
INTRAMUSCULAR | Status: AC
  Filled 2018-10-31: qty 50

## 2018-10-31 MED ORDER — IOHEXOL 350 MG/ML SOLN
100.0000 mL | Freq: Once | INTRAVENOUS | Status: AC | PRN
  Administered 2018-10-31: 100 mL via INTRAVENOUS

## 2018-10-31 MED ORDER — ALBUTEROL SULFATE (2.5 MG/3ML) 0.083% IN NEBU: 5.0000 mg | INHALATION_SOLUTION | Freq: Once | RESPIRATORY_TRACT | Status: DC

## 2018-10-31 NOTE — ED Notes (Signed)
RN attempted to stick for blood, unsuccessful, IV team consult still pending.

## 2018-10-31 NOTE — ED Notes (Signed)
Patient transported to CT 

## 2018-10-31 NOTE — ED Provider Notes (Signed)
[facility] DEPT Provider Note   CSN: [id number] Arrival date & time: 10/31/18  1532     History   Chief Complaint Chief Complaint  Patient presents with  . Shortness of Breath    HPI [name] is a 73 y.o. female.     HPI Patient presented to ED for evaluation of shortness of breath.  Patient states she has been feeling short of breath for several days.  She has had a cough and headache.  She denies any fevers.  She denies any chest pain.  He denies any leg swelling.  It was her not clearly exertional.  Nothing seems to make it better.  Patient went to her primary care doctor and the patient was directed to the ED.  Patient states her doctor was concerned about the possibility of COVID-19. Past Medical History:  Diagnosis Date  . Abscess of Bartholin's gland   . Acute vestibular neuronitis    Dr Lucia Gaskins  . ADHD (attention deficit hyperactivity disorder)   . Adverse effect of general anesthetic    felt paralyzed while receiving anesthesia  . Anxiety   . Bipolar 1 disorder ([place])   . CAD (coronary artery disease), native coronary artery    cath 05/2016 showing 50-70% stenosis in the mid LAD proximal to the first diagonal and 70-80% small OM1. FFR of LAD  not performed because of difficulty with catheter control from the right radial.   . Chronic kidney disease    kidney cancer- pt states she has elected to not have it treated.  . Depression    Bipolar disorder/goes to [facility] for meds  . Difficult intubation    told by [facility] that she was hard to intubate 15b yrs ago in [place]- surgery since then no problems  . GERD (gastroesophageal reflux disease)   . H/O echocardiogram 07/2012   [place] LVF w grade I siastolic dysfunction   . History of attention deficit disorder   . History of endometriosis   . History of pleural effusion   . Hyperlipidemia LDL goal <70 06/24/2016  . Hypertension   . 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 Memory difficulty 01/26/2017  . Migraines   . Obesity   . OSA (obstructive sleep apnea)    uses oral appliance instead of CPAP  . PAT (paroxysmal atrial tachycardia) (HCC)    s/p ablation  . PONV (postoperative nausea and vomiting)   . PTSD (post-traumatic stress disorder)   . PVC's (premature ventricular contractions)   . Renal mass 02/15/2012  . RLS (restless legs syndrome)    Dr Gwenette Greet  . rt renal ca dx'd 12/2009   no treatment/ no surg  . Vertigo     Patient Active Problem List   Diagnosis Date Noted  . Moderate bipolar I disorder, most recent episode depressed ([place])   . MDD (major depressive disorder), severe ([place]) 05/25/2018  . PONV (postoperative nausea and vomiting)   . Migraines   . History of pleural effusion   . History of attention deficit disorder   . History of endometriosis   . GERD (gastroesophageal reflux disease)   . Depression   . Chronic kidney disease   . CAD (coronary artery disease)   . Bipolar 1 disorder ([place])   . Anxiety   . Adverse effect of general anesthetic   . Acute vestibular neuronitis   . Abscess of Bartholin's gland   . ADHD (attention deficit hyperactivity disorder)   .  Memory difficulty 01/26/2017  . Vertigo 01/26/2017  . Hyperlipidemia LDL goal <70 06/24/2016  . GAD (generalized anxiety disorder) 06/03/2015  . PTSD (post-traumatic stress disorder) 01/01/2014  . Bipolar I disorder, most recent episode depressed ([place]) 01/01/2014  . Major depressive disorder, recurrent severe without psychotic features ([place]) 09/25/2013  . PVC's (premature ventricular contractions) 05/31/2013  . Diastolic dysfunction, left ventricle 05/31/2013  . PAT (paroxysmal atrial tachycardia) ([place]) 05/31/2013  . Essential hypertension, benign 05/31/2013  . Obesity 05/31/2013  . Recurrent major depression-severe ([place]) 04/27/2013  . H/O echocardiogram 07/20/2012  . Generalized anxiety disorder 06/30/2012  . Renal mass 02/15/2012  .  GALLSTONES 12/31/2009  . ATTENTION DEFICIT DISORDER 02/20/2007  . Obstructive sleep apnea 02/20/2007  . RESTLESS LEGS SYNDROME 02/20/2007  . Hypothyroidism 03/22/1988    Past Surgical History:  Procedure Laterality Date  . CARDIAC ELECTROPHYSIOLOGY [place] AND ABLATION  2000s  . LEFT HEART CATH AND CORONARY ANGIOGRAPHY N/A 06/08/2016   Procedure: Left Heart Cath and Coronary Angiography;  Surgeon: Belva Crome, MD;  Location: [facility];  Service: Cardiovascular;  Laterality: N/A;  . nasoseptal reconstruction  1990s  . THYROIDECTOMY  1990  . TONSILLECTOMY     as a child  . TUBAL LIGATION  1970s  . uterine mass removal  03/2012   was found to be benign     OB History   No obstetric history on file.      Home Medications    Prior to Admission medications   Medication Sig Start Date End Date Taking? Authorizing Provider  b complex vitamins tablet Take 1 tablet by mouth daily.    [provider]  divalproex (DEPAKOTE ER) 500 MG 24 hr tablet Take 2 tablets (1,000 mg total) by mouth daily. For mood 10/05/18   [name], MD  Flaxseed, Linseed, (FLAX SEED OIL PO) Take 1 tablet by mouth daily.    [provider]  levothyroxine (SYNTHROID, LEVOTHROID) 88 MCG tablet Take 1 tablet (88 mcg total) by mouth daily at 6 (six) AM. For hypothyroidism 05/31/18   Connye Burkitt, NP  LORazepam (ATIVAN) 0.5 MG tablet Take 1 tablet (0.5 mg total) by mouth 2 (two) times daily as needed for anxiety. 10/19/18   [name], MD  losartan-hydrochlorothiazide [facility]) 50-12.5 MG tablet Take 1 tablet by mouth daily. 01/19/18 01/19/19  Sueanne Margarita, MD  magnesium gluconate (MAGONATE) 500 MG tablet Take 500 mg by mouth daily.    [provider]  Menthol, Topical Analgesic, (BLUE GEL EX) Apply 1 application topically every 6 (six) hours as needed (restless legs).    [provider]  nebivolol (BYSTOLIC) 2.5 MG tablet Take 1 tablet (2.5 mg total) by mouth  daily. For high blood pressure 05/31/18   Connye Burkitt, NP  Omega-3 Fatty Acids (OMEGA 3 500 PO) Take 1,500 mg by mouth.    [provider]  ondansetron (ZOFRAN ODT) 8 MG disintegrating tablet Take 1 tablet (8 mg total) by mouth every 8 (eight) hours as needed for nausea or vomiting. 07/27/18   Jola Schmidt, MD  OVER THE COUNTER MEDICATION Take 1,500 mg by mouth daily. Thyrofine- OCT recommended by therapist. Taking for one month now    [provider]  Papaya Enzyme CHEW Chew 1 tablet by mouth 2 (two) times daily as needed (indigestion). Patient not taking: Reported on 10/05/2018 05/30/18   Connye Burkitt, NP  rOPINIRole (REQUIP) 0.5 MG tablet Take 1 tablet (0.5 mg total) by mouth at bedtime.  06/26/18   Fenton Foy, NP  Vitamin D, Cholecalciferol, 50 MCG (2000 UT) CAPS Take 2,000 Units by mouth daily.    [provider]    Family History Family History  Problem Relation Age of Onset  . Allergies Father   . Skin cancer Father   . Bipolar disorder Father   . Alcohol abuse Father   . Heart disease Mother   . Depression Mother   . Hypertension Mother   . CAD Mother   . Colon cancer Paternal Grandmother   . OCD Paternal Grandmother   . Allergies Sister        multiple  . Allergies Brother        multiple  . Allergies Daughter   . Heart disease Maternal Grandfather   . Cancer Paternal Grandfather     Social History Social History   Tobacco Use  . Smoking status: Never Smoker  . Smokeless tobacco: Never Used  Substance Use Topics  . Alcohol use: Not Currently    Alcohol/week: 1.0 standard drinks    Types: 1 Glasses of wine per week  . Drug use: No     Allergies   Geodon [ziprasidone hydrochloride], Lithium, Talwin [pentazocine], Toradol [ketorolac tromethamine], Ziprasidone, Abilify [aripiprazole], Compazine [prochlorperazine edisylate], Latuda [lurasidone hcl], Cbd kings [lido-menthol-methyl sal-camph], Pristiq [desvenlafaxine succinate er],  Rexulti [brexpiprazole], and Diflucan [fluconazole]   Review of Systems Review of Systems  All other systems reviewed and are negative.    Physical Exam Updated Vital Signs BP 130/72 (BP Location: Left Arm)   Pulse (!) 56   Temp 98.4 F (36.9 C) (Oral)   Resp 11   Ht 1.651 m (5\' 5" )   Wt 93.4 kg   SpO2 97%   BMI 34.28 kg/m   Physical Exam Vitals signs and nursing note reviewed.  Constitutional:      General: She is not in acute distress.    Appearance: She is well-developed.  HENT:     Head: Normocephalic and atraumatic.     Right Ear: External ear normal.     Left Ear: External ear normal.  Eyes:     General: No scleral icterus.       Right eye: No discharge.        Left eye: No discharge.     Conjunctiva/sclera: Conjunctivae normal.  Neck:     Musculoskeletal: Neck supple.     Trachea: No tracheal deviation.  Cardiovascular:     Rate and Rhythm: Normal rate and regular rhythm.  Pulmonary:     Effort: Pulmonary effort is normal. No respiratory distress.     Breath sounds: Normal breath sounds. No stridor. No wheezing or rales.  Abdominal:     General: Bowel sounds are normal. There is no distension.     Palpations: Abdomen is soft.     Tenderness: There is no abdominal tenderness. There is no guarding or rebound.  Musculoskeletal:        General: No tenderness.     Right lower leg: She exhibits no tenderness. No edema.     Left lower leg: She exhibits no tenderness. No edema.  Skin:    General: Skin is warm and dry.     Findings: No rash.  Neurological:     Mental Status: She is alert.     Cranial Nerves: No cranial nerve deficit (no facial droop, extraocular movements intact, no slurred speech).     Sensory: No sensory deficit.     Motor: No abnormal muscle  tone or seizure activity.     Coordination: Coordination normal.      ED Treatments / Results  Labs (all labs ordered are listed, but only abnormal results are displayed) Labs Reviewed  CBC -  Abnormal; Notable for the following components:      Result Value   Hemoglobin 11.5 (*)    All other components within normal limits  BASIC METABOLIC PANEL - Abnormal; Notable for the following components:   Calcium 8.8 (*)    All other components within normal limits  D-DIMER, QUANTITATIVE (NOT AT [facility]) - Abnormal; Notable for the following components:   D-Dimer, Quant 1.19 (*)    All other components within normal limits  SARS CORONAVIRUS 2 (HOSPITAL ORDER, [facility])  BRAIN NATRIURETIC PEPTIDE    EKG None  Radiology Ct Angio Chest Pe W And/or Wo Contrast  Result Date: 10/31/2018 CLINICAL DATA:  Shortness of breath, cough, headache, fatigue, bilateral rib pain EXAM: CT ANGIOGRAPHY CHEST WITH CONTRAST TECHNIQUE: Multidetector CT imaging of the chest was performed using the standard protocol during bolus administration of intravenous contrast. Multiplanar CT image reconstructions and MIPs were obtained to evaluate the vascular anatomy. CONTRAST:  147mL OMNIPAQUE IOHEXOL 350 MG/ML SOLN COMPARISON:  None. FINDINGS: Cardiovascular: Satisfactory opacification of the bilateral pulmonary arteries to the segmental level. No evidence of pulmonary embolism. No evidence of thoracic aortic aneurysm or dissection. Very mild atherosclerotic calcifications of the aortic arch. The heart is normal in size.  No pericardial effusion. Coronary atherosclerosis of the LAD. Mediastinum/Nodes: No suspicious mediastinal lymphadenopathy. Right thyroid is likely surgically absent. Residual left thyroid tissue is suspected. Lungs/Pleura: Mild interlobular septal thickening with ground-glass opacity in the upper lobes, raising the possibility of very mild interstitial edema. Trace bilateral pleural effusions. Associated mild dependent atelectasis in the bilateral lower lobes. No suspicious pulmonary nodules. No focal consolidation. No pleural effusion or pneumothorax. Upper Abdomen: Visualized  upper abdomen is notable for an 8.5 cm right upper pole renal cyst with calcifications and thin septations (series 4/image 84), chronic. Musculoskeletal: Mild degenerative changes of the visualized thoracolumbar spine. Bilateral ribs are unremarkable. Review of the MIP images confirms the above findings. IMPRESSION: No evidence of pulmonary embolism. Possible mild interstitial edema. Trace bilateral pleural effusions. Aortic Atherosclerosis (ICD10-I70.0). Electronically Signed   By: Julian Hy M.D.   On: 10/31/2018 23:17   Dg Chest Portable 1 View  Result Date: 10/31/2018 CLINICAL DATA:  Shortness of breath. No COVID-19 exposure. History of renal cancer. EXAM: PORTABLE CHEST 1 VIEW COMPARISON:  Radiograph Jul 27, 2018 FINDINGS: Accounting for body habitus, the lungs are clear. No consolidation, features of edema, pneumothorax, or effusion. Pulmonary vascularity is normally distributed. The cardiomediastinal contours are unremarkable. No acute osseous or soft tissue abnormality. Necklace partially included at the base of the neck. IMPRESSION: No acute cardiopulmonary abnormality. Electronically Signed   By: Lovena Le M.D.   On: 10/31/2018 19:52    Procedures Procedures (including critical care time)  Medications Ordered in ED Medications - No data to display   Initial Impression / Assessment and Plan / ED Course  I have reviewed the triage vital signs and the nursing notes.  Pertinent labs & imaging results that were available during my care of the patient were reviewed by me and considered in my medical decision making (see chart for details).   C/o shortness of breath.  Objectively, breathing easily, normal o2 sat.  COvid negative, CXR negative.  D dimer elevated, CT angio performed.  Negative for PE.  Doubt CHF, cardiac etiology.  Stable for outpt follow up, further workup if sx persist.  Final Clinical Impressions(s) / ED Diagnoses   Final diagnoses:  SOB (shortness of breath)     ED Discharge Orders    None       Dorie Rank, MD 11/01/18 [phone]

## 2018-10-31 NOTE — ED Notes (Signed)
IV team at bedside 

## 2018-10-31 NOTE — ED Provider Notes (Signed)
Arrives with c/o SOB. Patient care signed out at end of shift by Dr. Dorie Rank with CTA pending secondary to complaint and elevated d-dimer. COVID neg, d-dimer positive with c/o SOB  If negative, can be discharged home with PCP follow up.  Patient ambulatory to the bathroom without difficulty or apparent SOB. CTA negative for PE. She can be discharged home per plan of previous treatment team.    Charlann Lange, PA-C 11/01/18 0001    Dorie Rank, MD 11/01/18 0830

## 2018-10-31 NOTE — ED Notes (Signed)
Unsuccessful IV attempt x2.  

## 2018-10-31 NOTE — ED Triage Notes (Signed)
Pt sent over from PCP office due to increased SOB, dry cough, headache, fatigue and bilateral rib pain. Pt has not been tested for COVID but denies exposure.

## 2018-10-31 NOTE — ED Notes (Signed)
Patient ambulated to RR without aissistance, no SOB or acute distress noted, patient able to speak in full sentences.

## 2018-11-01 NOTE — ED Notes (Signed)
Pt verbalized discharge instructions and follow up care. Alert and ambulatory. No iv. [employer] contacted to take pt home.

## 2018-11-01 NOTE — Discharge Instructions (Addendum)
Follow up with your doctor for recheck this week if symptoms recur. Please return to the emergency department with any new or concerning symptoms.

## 2018-11-04 ENCOUNTER — Other Ambulatory Visit (HOSPITAL_COMMUNITY): Payer: Self-pay | Admitting: Psychiatry

## 2018-11-04 DIAGNOSIS — F3130 Bipolar disorder, current episode depressed, mild or moderate severity, unspecified: Secondary | ICD-10-CM

## 2018-11-08 ENCOUNTER — Ambulatory Visit: Payer: Self-pay | Admitting: Oncology

## 2018-11-13 ENCOUNTER — Other Ambulatory Visit (HOSPITAL_COMMUNITY): Payer: Self-pay

## 2018-11-13 DIAGNOSIS — F3130 Bipolar disorder, current episode depressed, mild or moderate severity, unspecified: Secondary | ICD-10-CM

## 2018-11-13 DIAGNOSIS — F4310 Post-traumatic stress disorder, unspecified: Secondary | ICD-10-CM

## 2018-11-13 MED ORDER — LORAZEPAM 0.5 MG PO TABS: 0.5000 mg | ORAL_TABLET | Freq: Two times a day (BID) | ORAL | 0 refills | Status: DC | PRN

## 2018-11-15 ENCOUNTER — Telehealth: Payer: Self-pay | Admitting: Oncology

## 2018-11-15 ENCOUNTER — Inpatient Hospital Stay: Payer: Medicare Other | Attending: Oncology | Admitting: Oncology

## 2018-11-15 NOTE — Telephone Encounter (Signed)
Called pt per 8/26 sch message - unable to reach pt  Left message for patient to call back to reschedule appt .

## 2018-11-23 ENCOUNTER — Other Ambulatory Visit: Payer: Self-pay

## 2018-11-23 ENCOUNTER — Encounter (HOSPITAL_COMMUNITY): Payer: Self-pay | Admitting: Psychiatry

## 2018-11-23 ENCOUNTER — Ambulatory Visit (INDEPENDENT_AMBULATORY_CARE_PROVIDER_SITE_OTHER): Payer: Medicare Other | Admitting: Psychiatry

## 2018-11-23 DIAGNOSIS — F3130 Bipolar disorder, current episode depressed, mild or moderate severity, unspecified: Secondary | ICD-10-CM | POA: Diagnosis not present

## 2018-11-23 DIAGNOSIS — F4310 Post-traumatic stress disorder, unspecified: Secondary | ICD-10-CM

## 2018-11-23 DIAGNOSIS — F411 Generalized anxiety disorder: Secondary | ICD-10-CM | POA: Diagnosis not present

## 2018-11-23 MED ORDER — DIVALPROEX SODIUM ER 500 MG PO TB24: 500.0000 mg | ORAL_TABLET | Freq: Every day | ORAL | 2 refills | Status: DC

## 2018-11-23 MED ORDER — LORAZEPAM 0.5 MG PO TABS: 0.5000 mg | ORAL_TABLET | Freq: Two times a day (BID) | ORAL | 2 refills | Status: DC | PRN

## 2018-11-23 NOTE — Progress Notes (Signed)
Virtual Visit via Telephone Note  I connected with [name] on 11/23/18 at 11:30 AM EDT by telephone and verified that I am speaking with the correct person using two identifiers.  Location: Patient: home Provider: office   I discussed the limitations, risks, security and privacy concerns of performing an evaluation and management service by telephone and the availability of in person appointments. I also discussed with the patient that there may be a patient responsible charge related to this service. The patient expressed understanding and agreed to proceed.   History of Present Illness: "I am existing". [name] is depressed. She has no drive or motivation. She has so much to do but is not doing any of it. "I'm stuck. I am taking it day by day".  Nothing is of interest to her. She is not taking care of herself or the house. [name] tried reading a book and could not concentrate. [name] states her mind is always going and doesn't stop. Her health issues continue and she needs to schedule some doctor appointments but has not. [name] is not going out much. She isn't tolerating the heat. A couple of weeks ago she went to the ED due to [place] like symptoms. The test was negative. Her sleep remains poor and broken. Some of it is due to sleep apnea. "I never sleep well". She tried Ativan at bedtime but it made her dreams worse. She is having bad dreams and nightmares. Some days she stays in bed all day but not as often as before. Pt denies HI. Pt denies SI/HI. She hopes she would just die. She denies any manic or hypomanic symptoms. [name] is coming to thinking that she will always be depressed.      Observations/Objective: I spoke with [name] on the phone.  Pt was calm, pleasant and cooperative.  Pt was engaged in the conversation and answered questions appropriately.  Speech was clear and coherent with normal rate, tone and volume.  Mood is depressed and anxious, affect is congruent.  Thought processes are coherent, goal oriented and intact.  Thought content is with ruminations.  Pt denies SI/HI.   Pt denies auditory and visual hallucinations and did not appear to be responding to internal stimuli.  Memory and concentration are good.  Fund of knowledge and use of language are average.  Insight and judgment are fair.  I am unable to comment on psychomotor activity, general appearance, hygiene, or eye contact as I was unable to physically see the patient on the phone.  Vital signs not available since interview conducted virtually.     Assessment and Plan: Bipolar I d/o- depressed, severe without psychotic features; GAD; PTSD; Cluster B personality d/o  Status of current symptoms: worsening depression  Meds: Depakote ER 500mg  po qLunch. At 1000mg  she had tremors  Ativan 0.5mg  po BID prn anxiety  Labs: 10/31/2018 BMP shows calcium is 8.8 but otherwise normal. Hb low 07/27/2018 Valproic acid 35  Pt has not been able to find a therapist. She realizes she needs to find one. I will attempt to schedule one with our clinic  Pt does not want meds changed today   Follow Up Instructions: In 6-8 weeks or sooner if needed   I discussed the assessment and treatment plan with the patient. The patient was provided an opportunity to ask questions and all were answered. The patient agreed with the plan and demonstrated an understanding of the instructions.   The patient was advised to call back or  seek an in-person evaluation if the symptoms worsen or if the condition fails to improve as anticipated.  I provided 30 minutes of non-face-to-face time during this encounter.**   [name], MD

## 2018-12-26 ENCOUNTER — Other Ambulatory Visit: Payer: Self-pay | Admitting: Nurse Practitioner

## 2018-12-29 NOTE — Telephone Encounter (Signed)
Refill Request for pt's ropinirole (Requip) 0.5 mg daily at bedtime. Ropinirole last refilled 06/26/2018 90 tablet x1 refill. Pt last seen 04/21/2018 by RA.   RA, please advise on the refill of this medication. Thank you.

## 2019-01-08 ENCOUNTER — Telehealth: Payer: Self-pay | Admitting: Oncology

## 2019-01-08 NOTE — Telephone Encounter (Signed)
Returned patient's phone call regarding rescheduling an appointment, per patient's request 08/26 missed appointment has been moved to 10/28.

## 2019-01-17 ENCOUNTER — Other Ambulatory Visit: Payer: Self-pay

## 2019-01-17 ENCOUNTER — Inpatient Hospital Stay: Payer: Medicare Other | Attending: Oncology | Admitting: Oncology

## 2019-01-17 VITALS — BP 114/60 | HR 65 | Temp 98.5°F | Resp 17 | Ht 65.0 in | Wt 217.2 lb

## 2019-01-17 DIAGNOSIS — F419 Anxiety disorder, unspecified: Secondary | ICD-10-CM | POA: Diagnosis not present

## 2019-01-17 DIAGNOSIS — R109 Unspecified abdominal pain: Secondary | ICD-10-CM | POA: Insufficient documentation

## 2019-01-17 DIAGNOSIS — N2889 Other specified disorders of kidney and ureter: Secondary | ICD-10-CM | POA: Diagnosis not present

## 2019-01-17 NOTE — Progress Notes (Signed)
Hematology and Oncology Follow Up Visit  RENISHA DIBATTISTA [id number] Jan 30, 1946 73 y.o. 01/17/2019 1:24 PM  CC: Antony Haste (C.Alan) Harrington Challenger, M.D.    Principle Diagnosis: 73 year old woman with right kidney mass measuring 4.6 x 4.4 x 3.1 cm in 2011.  She has refused work-up or evaluation in the past.  .   Current therapy: Active surveillance.  Interim History:  Ms. Ransone is here for a follow-up visit.  Since her last visit, he reports no major complaints at this time.  She continues to have emotional issues per her report including anxiety associated with her live-in partner.  She does report to some flank pain but appears to be higher up on the right side that is unrelated to her tumor.  She denies any hematuria or dysuria.   Patient denied any alteration mental status, neuropathy, confusion or dizziness.  Denies any headaches or lethargy.  Denies any night sweats, weight loss or changes in appetite.  Denied orthopnea, dyspnea on exertion or chest discomfort.  Denies shortness of breath, difficulty breathing hemoptysis or cough.  Denies any abdominal distention, nausea, early satiety or dyspepsia.  Denies any hematuria, frequency, dysuria or nocturia.  Denies any skin irritation, dryness or rash.  Denies any ecchymosis or petechiae.  Denies any lymphadenopathy or clotting.  Denies any heat or cold intolerance.  Denies any anxiety or depression.  Remaining review of system is negative.     Medications: Updated on review. Current Outpatient Medications  Medication Sig Dispense Refill  . aspirin EC 81 MG tablet Take 81 mg by mouth daily.    Marland Kitchen b complex vitamins tablet Take 1 tablet by mouth daily.    . diphenhydrAMINE (BENADRYL) 25 mg capsule Take 25 mg by mouth every 6 (six) hours as needed for allergies.    Marland Kitchen divalproex (DEPAKOTE ER) 500 MG 24 hr tablet Take 1 tablet (500 mg total) by mouth daily. For mood 30 tablet 2  . ketoconazole (NIZORAL) 2 % cream Apply 1 application topically 2 (two)  times daily.    Marland Kitchen L-Tyrosine 500 MG TABS Take 1,500 mg by mouth daily.    Marland Kitchen levothyroxine (SYNTHROID, LEVOTHROID) 88 MCG tablet Take 1 tablet (88 mcg total) by mouth daily at 6 (six) AM. For hypothyroidism 30 tablet 0  . LORazepam (ATIVAN) 0.5 MG tablet Take 1 tablet (0.5 mg total) by mouth 2 (two) times daily as needed for anxiety. 60 tablet 2  . losartan-hydrochlorothiazide (HYZAAR) 50-12.5 MG tablet Take 1 tablet by mouth daily. 90 tablet 3  . magnesium gluconate (MAGONATE) 500 MG tablet Take 500 mg by mouth daily.    . Menthol, Topical Analgesic, (BLUE GEL EX) Apply 1 application topically every 6 (six) hours as needed (restless legs).    . nebivolol (BYSTOLIC) 2.5 MG tablet Take 1 tablet (2.5 mg total) by mouth daily. For high blood pressure 30 tablet 0  . Omega-3 Fatty Acids (OMEGA 3 500 PO) Take 1,000 mg by mouth.     . ondansetron (ZOFRAN ODT) 8 MG disintegrating tablet Take 1 tablet (8 mg total) by mouth every 8 (eight) hours as needed for nausea or vomiting. 10 tablet 0  . Papaya Enzyme CHEW Chew 1 tablet by mouth 2 (two) times daily as needed (indigestion). 1 tablet 0  . rOPINIRole (REQUIP) 0.5 MG tablet TAKE 1 TABLET (0.5 MG TOTAL) BY MOUTH AT BEDTIME. 90 tablet 1  . Vitamin D, Cholecalciferol, 50 MCG (2000 UT) CAPS Take 2,000 Units by mouth daily.     No  current facility-administered medications for this visit.     Allergies:  Allergies  Allergen Reactions  . Geodon [Ziprasidone Hydrochloride] Other (See Comments)    Extremely agitated  . Lithium Nausea Only and Other (See Comments)    Off balance, increased heart rate  . Talwin [Pentazocine] Other (See Comments)    Hallucinations   . Toradol [Ketorolac Tromethamine] Other (See Comments)    Chest pains  . Ziprasidone Other (See Comments)    Extremely agitated  . Abilify [Aripiprazole] Other (See Comments)    jerking  . Compazine [Prochlorperazine Edisylate] Nausea And Vomiting  . Latuda [Lurasidone Hcl] Other (See  Comments)    Reports made her mind race more and irritable   . Pristiq [Desvenlafaxine Succinate Er] Other (See Comments)    Did not work, prefers not to take  . Rexulti [Brexpiprazole] Swelling    Elevated BP, created aggression  . Diflucan [Fluconazole] Nausea And Vomiting   Social history, family history and surgical history are are unchanged on review.   Physical Exam: Blood pressure 114/60, pulse 65, temperature 98.5 F (36.9 C), temperature source Oral, resp. rate 17, height 5\' 5"  (1.651 m), weight 217 lb 3.2 oz (98.5 kg), SpO2 100 %.    ECOG: 1    General appearance: Alert, awake without any distress. Head: Atraumatic without abnormalities Oropharynx: Without any thrush or ulcers. Eyes: No scleral icterus. Lymph nodes: No lymphadenopathy noted in the cervical, supraclavicular, or axillary nodes Heart:regular rate and rhythm, without any murmurs or gallops.   Lung: Clear to auscultation without any rhonchi, wheezes or dullness to percussion. Abdomin: Soft, nontender without any shifting dullness or ascites. Musculoskeletal: No clubbing or cyanosis. Neurological: No motor or sensory deficits. Skin: No rashes or lesions.    Lab Results: Lab Results  Component Value Date   WBC 8.5 10/31/2018   HGB 11.5 (L) 10/31/2018   HCT 36.0 10/31/2018   MCV 88.0 10/31/2018   PLT 229 10/31/2018     Chemistry      Component Value Date/Time   NA 137 10/31/2018 2045   NA 144 05/11/2016 1450   NA 139 02/18/2015 1440   K 3.8 10/31/2018 2045   K 4.2 02/18/2015 1440   CL 103 10/31/2018 2045   CL 103 07/21/2012 1415   CO2 25 10/31/2018 2045   CO2 29 02/18/2015 1440   BUN 18 10/31/2018 2045   BUN 21 05/11/2016 1450   BUN 19.1 02/18/2015 1440   CREATININE 0.77 10/31/2018 2045   CREATININE 0.9 02/18/2015 1440      Component Value Date/Time   CALCIUM 8.8 (L) 10/31/2018 2045   CALCIUM 10.2 02/18/2015 1440   ALKPHOS 76 07/27/2018 1627   ALKPHOS 89 02/18/2015 1440   AST 18  07/27/2018 1627   AST 20 02/18/2015 1440   ALT 13 07/27/2018 1627   ALT 16 02/18/2015 1440   BILITOT 0.3 07/27/2018 1627   BILITOT 0.37 02/18/2015 1440      EXAM: CT ABDOMEN AND PELVIS WITH CONTRAST  TECHNIQUE: Multidetector CT imaging of the abdomen and pelvis was performed using the standard protocol following bolus administration of intravenous contrast.  CONTRAST:  163mL OMNIPAQUE IOHEXOL 300 MG/ML  SOLN  COMPARISON:  Ultrasound 03/24/2018, CT 01/09/2016  FINDINGS: Lower chest: Lung bases demonstrate no acute consolidation or pleural effusion. The heart size is normal. Coronary vascular calcification.  Hepatobiliary: Stable 17 mm hypodense lesion in the right lobe of liver. Stable 13 mm hypodense lesion left lobe of liver. Multiple calcified gallstones. No  biliary dilatation  Pancreas: Unremarkable. No pancreatic ductal dilatation or surrounding inflammatory changes.  Spleen: Normal in size without focal abnormality.  Adrenals/Urinary Tract: Adrenal glands are normal. Cyst upper pole left kidney. Septated cyst with calcification measuring 8.5 cm upper pole right kidney, previously 7.5 cm in maximum diameter. Increased size of heterogenous enhancing mass lesion in the mid right kidney now measuring 6 x 6.7 by 5.6 cm, previously 6.1 x 5.2 x 4.8 cm on CT. Bladder normal  Stomach/Bowel: Stomach is within normal limits. Appendix appears normal. No evidence of bowel wall thickening, distention, or inflammatory changes.  Vascular/Lymphatic: Mild aortic atherosclerosis. Nonaneurysmal aorta. No significantly enlarged lymph nodes  Reproductive: Lobulated uterus with calcified masses consistent with fibroids. No adnexal mass.  Other: No free air or free fluid  Musculoskeletal: No acute or suspicious osseous abnormality  IMPRESSION: 1. No CT evidence for acute intra-abdominal or pelvic abnormality. 2. Right renal malignancy increased in size as compared  with prior CT from 2017. Increased size of complicated cyst upper pole right kidney 3. Gallstones 4. Fibroid uterus  73 year old woman with:  1.  Right renal mass diagnosed in 2011.  At that time it was measuring about 4.6 x 4.4 cm and has not changed dramatically over the years.  S masses suspicious for malignancy at this time but she has refused work-up in the past.    CT scan of the abdomen and pelvis obtained on Jul 27, 2018 was reviewed and showed slight increase in the right kidney mass currently at 6 x 6.7 x 5.6 cm.  These are not dramatically different than the initial measurement in 2011 but do indicate growth that is remarkably slow at this time.   Management options at this time were reviewed and obtaining tissue biopsy versus radical nephrectomy versus continued observation were reiterated.  She continues to be hesitant of urological evaluation at this time.  She will think about it let me know in the near future.  2.  Follow-up: We will be in 12 months sooner if needed.  15  minutes was spent with the patient face-to-face today.  More than 50% of time was spent on updating her disease status, discussing imaging studies, differential diagnosis and management options for the future.    Roxy Cedar Zunairah Devers 10/28/20201:24 PM

## 2019-01-25 ENCOUNTER — Other Ambulatory Visit: Payer: Self-pay

## 2019-01-25 ENCOUNTER — Ambulatory Visit (INDEPENDENT_AMBULATORY_CARE_PROVIDER_SITE_OTHER): Payer: Medicare Other | Admitting: Psychiatry

## 2019-01-25 ENCOUNTER — Encounter (HOSPITAL_COMMUNITY): Payer: Self-pay | Admitting: Psychiatry

## 2019-01-25 DIAGNOSIS — F3130 Bipolar disorder, current episode depressed, mild or moderate severity, unspecified: Secondary | ICD-10-CM | POA: Diagnosis not present

## 2019-01-25 DIAGNOSIS — F4310 Post-traumatic stress disorder, unspecified: Secondary | ICD-10-CM

## 2019-01-25 MED ORDER — HYDROXYZINE PAMOATE 25 MG PO CAPS: 25.0000 mg | ORAL_CAPSULE | Freq: Every evening | ORAL | 0 refills | Status: DC | PRN

## 2019-01-25 MED ORDER — METHYLPHENIDATE HCL 5 MG PO TABS: 5.0000 mg | ORAL_TABLET | Freq: Two times a day (BID) | ORAL | 0 refills | Status: DC

## 2019-01-25 MED ORDER — DIVALPROEX SODIUM ER 500 MG PO TB24: 500.0000 mg | ORAL_TABLET | Freq: Every day | ORAL | 2 refills | Status: DC

## 2019-01-25 MED ORDER — LORAZEPAM 0.5 MG PO TABS: 0.5000 mg | ORAL_TABLET | Freq: Two times a day (BID) | ORAL | 2 refills | Status: DC | PRN

## 2019-01-25 NOTE — Progress Notes (Signed)
Virtual Visit via Telephone Note  I connected with [name]  on 01/25/19 at  9:30 AM EST by telephone and verified that I am speaking with the correct person using two identifiers.  Location: Patient: home Provider: office   I discussed the limitations, risks, security and privacy concerns of performing an evaluation and management service by telephone and the availability of in person appointments. I also discussed with the patient that there may be a patient responsible charge related to this service. The patient expressed understanding and agreed to proceed.   History of Present Illness: "I am not good. I haven't been good in a long time. I am a mess". She is not sleeping all day. Her self care is very poor. The biggest problem is that her finance is getting worse mentally. [name] is ready to leave. She can't take it anymore. She is going to visit a friend and will them come back and find a place to move. [name] doesn't interact with her fiance anymore and she doesn't like him anymore. It is a constant stress and she "can't keep going on like this". She feels sick everyday. Her stomach hurts and she doesn't have much of an appetite. She is no longer engaging in exercise or walks. In the past she was on Adderall and felt it helped to clear her mind and function. She denies any manic and hypomanic like symptoms. She denies "overspending, over eating or over talking". [name] denies SI/HI. She has decided to have surgery to have her "tumor removed rather than let it kill me like before". Her anxiety is extremely high. She is going to short on the Ativan again this month. She sometimes has to take 2 tabs at night because she wakes up with severe anxiety.     Observations/Objective: I spoke with [name] on the phone.  Pt was calm, pleasant and cooperative.  Pt was engaged in the conversation and answered questions appropriately.  Speech was clear and coherent with normal rate,  tone and volume.  Mood is depressed and anxious, affect is congruent. Thought processes are coherent, goal oriented and intact.  Thought content is with ruminations.  Pt denies SI/HI.   Pt denies auditory and visual hallucinations and did not appear to be responding to internal stimuli.  Memory and concentration are good.  Fund of knowledge and use of language are average.  Insight and judgment are fair.  I am unable to comment on psychomotor activity, general appearance, hygiene, or eye contact as I was unable to physically see the patient on the phone.  Vital signs not available since interview conducted virtually.    I reviewed the information below on 01/25/2019 and have updated it Assessment and Plan: Bipolar I d/o- depressed, severe without psychotic features; GAD; PTSD; Cluster B personality d/o   Status of current symptoms:  Worsening depression   Meds: Depakote ER 500mg  po qLunch. At 1000mg  she had tremors   Ativan 0.5mg  po BID prn anxiety  Start Ritalin 5mg  po BID for adjunctive depression treatment  Start Vistaril 25mg  po qHS for sleep. Advised her to set appt with sleep doc regarding her sleep apnea  D/c Benadryl  Pt is working with a therapist  Pt is working on finding a good support person   Follow Up Instructions: In 6 weeks or sooner if needed   I discussed the assessment and treatment plan with the patient. The patient was provided an opportunity to ask questions and all were answered.  The patient agreed with the plan and demonstrated an understanding of the instructions.   The patient was advised to call back or seek an in-person evaluation if the symptoms worsen or if the condition fails to improve as anticipated.  I provided 25 minutes of non-face-to-face time during this encounter.   [name], MD

## 2019-01-30 ENCOUNTER — Telehealth: Payer: Self-pay | Admitting: Cardiology

## 2019-01-30 NOTE — Telephone Encounter (Signed)
I am fine with that 

## 2019-01-30 NOTE — Telephone Encounter (Signed)
  Patient would like to switch from Dr Radford Pax to Dr Frederik Pear. [facility] is closer for her.

## 2019-01-31 ENCOUNTER — Telehealth: Payer: Self-pay | Admitting: Cardiology

## 2019-01-31 NOTE — Telephone Encounter (Signed)
° ° ° ° °  Called pt and left a message on her voicemail to call and schedule a new pt appointment with Dr Harrell Gave. Pt is switching from Dr Radford Pax. Both doctors agreed on this switch.

## 2019-01-31 NOTE — Telephone Encounter (Signed)
Message sent to scheduling to arrange f/u appointment with Dr. Harrell Gave.

## 2019-02-05 NOTE — Telephone Encounter (Signed)
Left message for patient to call and schedule appointment with Dr. Leda Gauze is changing from Dr. Radford Pax to Dr. Harrell Gave

## 2019-02-06 ENCOUNTER — Other Ambulatory Visit: Payer: Self-pay | Admitting: Cardiology

## 2019-02-10 ENCOUNTER — Other Ambulatory Visit: Payer: Self-pay

## 2019-02-10 ENCOUNTER — Emergency Department (HOSPITAL_COMMUNITY)
Admission: EM | Admit: 2019-02-10 | Discharge: 2019-02-10 | Disposition: A | Discharge status: Home / Self Care | Payer: Medicare Other | Attending: Emergency Medicine | Admitting: Emergency Medicine

## 2019-02-10 ENCOUNTER — Encounter (HOSPITAL_COMMUNITY): Payer: Self-pay

## 2019-02-10 DIAGNOSIS — I2510 Atherosclerotic heart disease of native coronary artery without angina pectoris: Secondary | ICD-10-CM | POA: Insufficient documentation

## 2019-02-10 DIAGNOSIS — I129 Hypertensive chronic kidney disease with stage 1 through stage 4 chronic kidney disease, or unspecified chronic kidney disease: Secondary | ICD-10-CM | POA: Insufficient documentation

## 2019-02-10 DIAGNOSIS — Z7982 Long term (current) use of aspirin: Secondary | ICD-10-CM | POA: Diagnosis not present

## 2019-02-10 DIAGNOSIS — R197 Diarrhea, unspecified: Secondary | ICD-10-CM | POA: Diagnosis not present

## 2019-02-10 DIAGNOSIS — R112 Nausea with vomiting, unspecified: Secondary | ICD-10-CM | POA: Insufficient documentation

## 2019-02-10 DIAGNOSIS — E039 Hypothyroidism, unspecified: Secondary | ICD-10-CM | POA: Insufficient documentation

## 2019-02-10 DIAGNOSIS — Z79899 Other long term (current) drug therapy: Secondary | ICD-10-CM | POA: Insufficient documentation

## 2019-02-10 DIAGNOSIS — N189 Chronic kidney disease, unspecified: Secondary | ICD-10-CM | POA: Diagnosis not present

## 2019-02-10 DIAGNOSIS — R1110 Vomiting, unspecified: Secondary | ICD-10-CM | POA: Diagnosis present

## 2019-02-10 LAB — COMPREHENSIVE METABOLIC PANEL
ALT: 12 U/L (ref 0–44)
AST: 64 U/L — ABNORMAL HIGH (ref 15–41)
Albumin: 4.1 g/dL (ref 3.5–5.0)
Alkaline Phosphatase: 81 U/L (ref 38–126)
Anion gap: 11 (ref 5–15)
BUN: 24 mg/dL — ABNORMAL HIGH (ref 8–23)
CO2: 26 mmol/L (ref 22–32)
Calcium: 9.8 mg/dL (ref 8.9–10.3)
Chloride: 104 mmol/L (ref 98–111)
Creatinine, Ser: 1.03 mg/dL — ABNORMAL HIGH (ref 0.44–1.00)
GFR calc Af Amer: >60 mL/min (ref >60)
GFR calc non Af Amer: 54 mL/min — ABNORMAL LOW (ref >60)
Glucose, Bld: 133 mg/dL — ABNORMAL HIGH (ref 70–99)
Potassium: 5.2 mmol/L — ABNORMAL HIGH (ref 3.5–5.1)
Sodium: 141 mmol/L (ref 135–145)
Total Bilirubin: 1.5 mg/dL — ABNORMAL HIGH (ref 0.3–1.2)
Total Protein: 8.8 g/dL — ABNORMAL HIGH (ref 6.5–8.1)

## 2019-02-10 LAB — CBC WITH DIFFERENTIAL/PLATELET
Abs Immature Granulocytes: 0.01 10*3/uL (ref 0.00–0.07)
Basophils Absolute: 0 10*3/uL (ref 0.0–0.1)
Basophils Relative: 0 %
Eosinophils Absolute: 0.1 10*3/uL (ref 0.0–0.5)
Eosinophils Relative: 1 %
HCT: 48 % — ABNORMAL HIGH (ref 36.0–46.0)
Hemoglobin: 14.9 g/dL (ref 12.0–15.0)
Immature Granulocytes: 0 %
Lymphocytes Relative: 6 %
Lymphs Abs: 0.5 10*3/uL — ABNORMAL LOW (ref 0.7–4.0)
MCH: 27.7 pg (ref 26.0–34.0)
MCHC: 31 g/dL (ref 30.0–36.0)
MCV: 89.2 fL (ref 80.0–100.0)
Monocytes Absolute: 0.6 10*3/uL (ref 0.1–1.0)
Monocytes Relative: 8 %
Neutro Abs: 6.5 10*3/uL (ref 1.7–7.7)
Neutrophils Relative %: 85 %
Platelets: 310 10*3/uL (ref 150–400)
RBC: 5.38 MIL/uL — ABNORMAL HIGH (ref 3.87–5.11)
RDW: 13.4 % (ref 11.5–15.5)
WBC: 7.6 10*3/uL (ref 4.0–10.5)
nRBC: 0 % (ref 0.0–0.2)

## 2019-02-10 LAB — URINALYSIS, ROUTINE W REFLEX MICROSCOPIC
Bilirubin Urine: NEGATIVE
Glucose, UA: NEGATIVE mg/dL
Hgb urine dipstick: NEGATIVE
Ketones, ur: NEGATIVE mg/dL
Leukocytes,Ua: NEGATIVE
Nitrite: NEGATIVE
Protein, ur: NEGATIVE mg/dL
Specific Gravity, Urine: 1.025 (ref 1.005–1.030)
pH: 5 (ref 5.0–8.0)

## 2019-02-10 LAB — LIPASE, BLOOD: Lipase: 34 U/L (ref 11–51)

## 2019-02-10 MED ORDER — ONDANSETRON 4 MG PO TBDP: 4.0000 mg | ORAL_TABLET | Freq: Three times a day (TID) | ORAL | 0 refills | Status: DC | PRN

## 2019-02-10 MED ORDER — HYDROCHLOROTHIAZIDE 12.5 MG PO TABS: 12.5000 mg | ORAL_TABLET | Freq: Every day | ORAL | 0 refills | Status: DC

## 2019-02-10 MED ORDER — SODIUM CHLORIDE 0.9 % IV BOLUS
1000.0000 mL | Freq: Once | INTRAVENOUS | Status: AC
  Administered 2019-02-10: 1000 mL via INTRAVENOUS

## 2019-02-10 MED ORDER — METOCLOPRAMIDE HCL 5 MG/ML IJ SOLN
5.0000 mg | Freq: Once | INTRAMUSCULAR | Status: AC
  Administered 2019-02-10: 5 mg via INTRAVENOUS
  Filled 2019-02-10: qty 2

## 2019-02-10 NOTE — Discharge Instructions (Addendum)
Take the Zofran as needed to help with nausea. Slowly advance your diet as tolerated to prevent worsening of your symptoms. Your potassium was slightly high today. Instead of taking Hyzaar (losartan-hydrochlorothiazide), you can take only hydrochlorothiazide, daily for the next 3 days.  Please follow-up with your primary care provider for a recheck of your potassium level. Return to the ED if you start to experience worsening symptoms, develop abdominal pain, fever, chest pain, shortness of breath.

## 2019-02-10 NOTE — ED Notes (Signed)
My colleague will attempt u/s-guided IV now.

## 2019-02-10 NOTE — ED Triage Notes (Signed)
She reports n/v/d x 4 hours today. She is in no distress. She also tells me that she has known gallstones and she found out about this "years ago".

## 2019-02-10 NOTE — ED Provider Notes (Signed)
[facility] DEPT Provider Note   CSN: [id number] Arrival date & time: 02/10/19  1331     History   Chief Complaint Chief Complaint  Patient presents with   Emesis   Diarrhea    HPI [name] is a 73 y.o. female with a past medical history of CKD, CAD, presenting to the ED with a chief complaint of emesis, diarrhea.  Approximately 5 hours prior to arrival started having nonbloody, nonbilious emesis and nonbloody diarrhea x6 episodes each.  She states she felt more fatigued than usual last night but was otherwise in her usual state of health.  Did not eat breakfast this morning, last p.o. intake was approximately 8 hours ago when she ate ice cream.  She states that this is happened to her in the past but unable to tell me what the diagnosis was.  She has tried Zofran but unfortunately vomited immediately after trying to swallow it.  No sick contacts with similar symptoms.  Denies any urinary symptoms, fever, shortness of breath, suspicious food ingestions.     HPI  Past Medical History:  Diagnosis Date   Abscess of Bartholin's gland    Acute vestibular neuronitis    Dr Lucia Gaskins   ADHD (attention deficit hyperactivity disorder)    Adverse effect of general anesthetic    felt paralyzed while receiving anesthesia   Anxiety    Bipolar 1 disorder ([place])    CAD (coronary artery disease), native coronary artery    cath 05/2016 showing 50-70% stenosis in the mid LAD proximal to the first diagonal and 70-80% small OM1. FFR of LAD  not performed because of difficulty with catheter control from the right radial.    Chronic kidney disease    kidney cancer- pt states she has elected to not have it treated.   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   Memory difficulty 01/26/2017   Migraines    Obesity    OSA (obstructive sleep apnea)    uses oral appliance instead of CPAP   PAT (paroxysmal atrial tachycardia) (HCC)    s/p ablation   PONV (postoperative nausea and vomiting)    PTSD (post-traumatic stress disorder)    PVC's (premature ventricular contractions)    Renal mass 02/15/2012   RLS (restless legs syndrome)    Dr Gwenette Greet   rt renal ca dx'd 12/2009   no treatment/ no surg   Vertigo     Patient Active Problem List   Diagnosis Date Noted   Moderate bipolar I disorder, most recent episode depressed ([place])    MDD (major depressive disorder), severe ([place]) 05/25/2018   PONV (postoperative nausea and vomiting)    Migraines    History of pleural effusion    History of attention deficit disorder    History of endometriosis    GERD (gastroesophageal reflux disease)    Depression    Chronic kidney disease    CAD (coronary  artery disease)    Bipolar 1 disorder (HCC)    Anxiety    Adverse effect of general anesthetic    Acute vestibular neuronitis    Abscess of Bartholin's gland    ADHD (attention deficit hyperactivity disorder)    Memory difficulty 01/26/2017   Vertigo 01/26/2017   Hyperlipidemia LDL goal <70 06/24/2016   GAD (generalized anxiety disorder) 06/03/2015   PTSD (post-traumatic stress disorder) 01/01/2014   Bipolar I disorder, most recent episode depressed ([place]) 01/01/2014   Major depressive disorder, 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/25/2013   PVC's (premature ventricular contractions) [id number]   Diastolic dysfunction, left ventricle 05/31/2013    PAT (paroxysmal atrial tachycardia) ([place]) 05/31/2013   Essential hypertension, benign 05/31/2013   Obesity 05/31/2013   Recurrent major depression-severe ([place]) 04/27/2013   H/O echocardiogram 07/20/2012   Generalized anxiety disorder 06/30/2012   Renal mass 02/15/2012   GALLSTONES 12/31/2009   ATTENTION DEFICIT DISORDER 02/20/2007   Obstructive sleep apnea 02/20/2007   RESTLESS LEGS SYNDROME 02/20/2007   Hypothyroidism 03/22/1988    Past Surgical History:  Procedure Laterality Date   CARDIAC ELECTROPHYSIOLOGY MAPPING AND ABLATION  2000s   LEFT HEART CATH AND CORONARY ANGIOGRAPHY N/A 06/08/2016   Procedure: Left Heart Cath and Coronary Angiography;  Surgeon: Belva Crome, MD;  Location: [facility];  Service: Cardiovascular;  Laterality: N/A;   nasoseptal reconstruction  [place]     as a child   TUBAL LIGATION  1970s   uterine mass removal  03/2012   was found to be benign     OB History   No obstetric history on file.      Home Medications    Prior to Admission medications   Medication Sig Start Date End Date Taking? Authorizing Provider  aspirin EC 81 MG tablet Take 81 mg by mouth daily.   Yes [provider]  b complex vitamins tablet Take 1 tablet by mouth daily.   Yes [provider]  diphenhydrAMINE (BENADRYL) 25 mg capsule Take 25 mg by mouth every 6 (six) hours as needed for allergies.   Yes [provider]  divalproex (DEPAKOTE ER) 500 MG 24 hr tablet Take 1 tablet (500 mg total) by mouth daily. For mood 01/25/19  Yes [name], MD  fluticasone [facility]) 50 MCG/ACT nasal spray Place 1 spray into both nostrils daily. 01/29/19  Yes [provider]  hydrOXYzine (VISTARIL) 25 MG capsule Take 1 capsule (25 mg total) by mouth at bedtime as needed for anxiety (sleeping). 01/25/19  Yes [name], MD  ketoconazole (NIZORAL) 2 % cream Apply 1 application topically 2 (two)  times daily. 10/19/18  Yes [provider]  levothyroxine (SYNTHROID, LEVOTHROID) 88 MCG tablet Take 1 tablet (88 mcg total) by mouth daily at 6 (six) AM. For hypothyroidism 05/31/18  Yes Connye Burkitt, NP  LORazepam (ATIVAN) 0.5 MG tablet Take 1 tablet (0.5 mg total) by mouth 2 (two) times daily as needed for anxiety. 01/25/19  Yes [name], MD  losartan-hydrochlorothiazide [facility]) 50-12.5 MG tablet Take 1 tablet by mouth daily. Please keep upcoming appt in November before anymore refills. Thank you 02/08/19  Yes Turner, Eber Hong, MD  magnesium gluconate (MAGONATE) 500 MG tablet Take 500 mg by mouth daily.   Yes [provider]  Menthol, Topical Analgesic, (BLUE GEL EX) Apply 1 application topically every 6 (six) hours as needed (restless legs).   Yes [provider]  methylphenidate (  RITALIN) 5 MG tablet Take 1 tablet (5 mg total) by mouth 2 (two) times daily with breakfast and lunch. 01/25/19 04/27/19 Yes [name], MD  nebivolol (BYSTOLIC) 2.5 MG tablet Take 1 tablet (2.5 mg total) by mouth daily. For high blood pressure 05/31/18  Yes Connye Burkitt, NP  Omega-3 Fatty Acids (OMEGA 3 500 PO) Take 1,000 mg by mouth.    Yes [provider]  ondansetron (ZOFRAN) 8 MG tablet Take 8 mg by mouth every 8 (eight) hours as needed. 01/03/19  Yes [provider]  Papaya Enzyme CHEW Chew 1 tablet by mouth 2 (two) times daily as needed (indigestion). 05/30/18  Yes Connye Burkitt, NP  rOPINIRole (REQUIP) 0.5 MG tablet TAKE 1 TABLET (0.5 MG TOTAL) BY MOUTH AT BEDTIME. 12/29/18  Yes Rigoberto Noel, MD  Vitamin D, Cholecalciferol, 50 MCG (2000 UT) CAPS Take 2,000 Units by mouth daily.   Yes [provider]  hydrochlorothiazide (HYDRODIURIL) 12.5 MG tablet Take 1 tablet (12.5 mg total) by mouth daily for 3 days. 02/10/19 02/13/19  Aryon Nham, PA-C  methylphenidate (RITALIN) 5 MG tablet Take 1 tablet (5 mg total) by mouth 2 (two) times daily with breakfast  and lunch. Patient not taking: Reported on 02/10/2019 01/25/19 04/27/19  [name], MD  ondansetron (ZOFRAN ODT) 4 MG disintegrating tablet Take 1 tablet (4 mg total) by mouth every 8 (eight) hours as needed for nausea or vomiting. 02/10/19   Delia Heady, PA-C    Family History Family History  Problem Relation Age of Onset   Allergies Father    Skin cancer Father    Bipolar disorder Father    Alcohol abuse Father    Heart disease Mother    Depression Mother    Hypertension Mother    CAD Mother    Colon cancer Paternal Grandmother    OCD Paternal Grandmother    Allergies Sister        multiple   Allergies Brother        multiple   Allergies Daughter    Heart disease Maternal Grandfather    Cancer Paternal Grandfather     Social History Social History   Tobacco Use   Smoking status: Never Smoker   Smokeless tobacco: Never Used  Substance Use Topics   Alcohol use: Not Currently    Alcohol/week: 1.0 standard drinks    Types: 1 Glasses of wine per week   Drug use: No     Allergies   Geodon [ziprasidone hydrochloride], Lithium, Talwin [pentazocine], Toradol [ketorolac tromethamine], Ziprasidone, Abilify [aripiprazole], Compazine [prochlorperazine edisylate], Latuda [lurasidone hcl], Pristiq [desvenlafaxine succinate er], Rexulti [brexpiprazole], and Diflucan [fluconazole]   Review of Systems Review of Systems  Constitutional: Negative for appetite change, chills and fever.  HENT: Negative for ear pain, rhinorrhea, sneezing and sore throat.   Eyes: Negative for photophobia and visual disturbance.  Respiratory: Negative for cough, chest tightness, shortness of breath and wheezing.   Cardiovascular: Negative for chest pain and palpitations.  Gastrointestinal: Positive for diarrhea, nausea and vomiting. Negative for abdominal pain, blood in stool and constipation.  Genitourinary: Negative for dysuria, hematuria and urgency.  Musculoskeletal: Negative  for myalgias.  Skin: Negative for rash.  Neurological: Negative for dizziness, weakness and light-headedness.     Physical Exam Updated Vital Signs BP 134/73    Pulse 76    Temp (!) 97.3 F (36.3 C) (Oral)    Resp 16    SpO2 99%   Physical Exam Vitals signs and nursing  note reviewed.  Constitutional:      General: She is not in acute distress.    Appearance: She is well-developed.  HENT:     Head: Normocephalic and atraumatic.     Nose: Nose normal.  Eyes:     General: No scleral icterus.       Left eye: No discharge.     Conjunctiva/sclera: Conjunctivae normal.  Neck:     Musculoskeletal: Normal range of motion and neck supple.  Cardiovascular:     Rate and Rhythm: Normal rate and regular rhythm.     Heart sounds: Normal heart sounds. No murmur. No friction rub. No gallop.   Pulmonary:     Effort: Pulmonary effort is normal. No respiratory distress.     Breath sounds: Normal breath sounds.  Abdominal:     General: Bowel sounds are normal. There is no distension.     Palpations: Abdomen is soft.     Tenderness: There is no abdominal tenderness. There is no guarding.  Musculoskeletal: Normal range of motion.  Skin:    General: Skin is warm and dry.     Findings: No rash.  Neurological:     Mental Status: She is alert.     Motor: No abnormal muscle tone.     Coordination: Coordination normal.      ED Treatments / Results  Labs (all labs ordered are listed, but only abnormal results are displayed) Labs Reviewed  COMPREHENSIVE METABOLIC PANEL - Abnormal; Notable for the following components:      Result Value   Potassium 5.2 (*)    Glucose, Bld 133 (*)    BUN 24 (*)    Creatinine, Ser 1.03 (*)    Total Protein 8.8 (*)    AST 64 (*)    Total Bilirubin 1.5 (*)    GFR calc non Af Amer 54 (*)    All other components within normal limits  CBC WITH DIFFERENTIAL/PLATELET - Abnormal; Notable for the following components:   RBC 5.38 (*)    HCT 48.0 (*)    Lymphs  Abs 0.5 (*)    All other components within normal limits  LIPASE, BLOOD  URINALYSIS, ROUTINE W REFLEX MICROSCOPIC    EKG None  Radiology No results found.  Procedures Procedures (including critical care time)  Medications Ordered in ED Medications  sodium chloride 0.9 % bolus 1,000 mL (0 mLs Intravenous Stopped 02/10/19 1547)  metoCLOPramide (REGLAN) injection 5 mg (5 mg Intravenous Given 02/10/19 1442)     Initial Impression / Assessment and Plan / ED Course  I have reviewed the triage vital signs and the nursing notes.  Pertinent labs & imaging results that were available during my care of the patient were reviewed by me and considered in my medical decision making (see chart for details).        73yo F presents to ED with a chief complaint of emesis, diarrhea. 5hrs prior to arrival began having nonbloody, nonbilious emesis and nonbloody diarrhea x6 episodes.  More fatigued than usual last night but otherwise in her usual state of health.  On exam patient is overall well-appearing.  Abdomen is soft, nontender nondistended.  Denies any urinary symptoms or sick contacts with similar symptoms.  Her vital signs are within normal limits.  Lab work including CMP, CBC, lipase and urinalysis are all unremarkable.  Slight elevation in creatinine compared to baseline.  Slight hyperkalemia at 5.2, could be medication related so will  She was given IV fluids and Reglan  with complete resolution of her symptoms.  She was able to tolerate p.o. intake without difficulty after medications given.  Repeat abdominal exams remain benign.  Patient is requesting discharge home with ODT Zofran instead of p.o.  Feel this is reasonable.  Suspect her symptoms are viral in nature.  Doubt appendicitis, cholecystitis or surgical or emergent cause of her symptoms. She is stable for discharge.  Patient is hemodynamically stable, in NAD, and able to ambulate in the ED. Evaluation does not show pathology that would  require ongoing emergent intervention or inpatient treatment. I explained the diagnosis to the patient. Pain has been managed and has no complaints prior to discharge. Patient is comfortable with above plan and is stable for discharge at this time. All questions were answered prior to disposition. Strict return precautions for returning to the ED were discussed. Encouraged follow up with PCP.   An After Visit Summary was printed and given to the patient.   Portions of this note were generated with [employer]. Dictation errors may occur despite best attempts at proofreading.   Final Clinical Impressions(s) / ED Diagnoses   Final diagnoses:  Nausea vomiting and diarrhea    ED Discharge Orders         Ordered    ondansetron (ZOFRAN ODT) 4 MG disintegrating tablet  Every 8 hours PRN     02/10/19 1651    hydrochlorothiazide (HYDRODIURIL) 12.5 MG tablet  Daily     02/10/19 1713           Delia Heady, PA-C 02/10/19 1728    Sherwood Gambler, MD 02/12/19 1009

## 2019-02-12 ENCOUNTER — Telehealth: Payer: Self-pay | Admitting: Cardiology

## 2019-02-12 NOTE — Telephone Encounter (Signed)
New message    Patient recently seen in ED, she has questions about what BP medications to continue or discontinue. Patient also requesting RX for Zofran

## 2019-02-12 NOTE — Telephone Encounter (Signed)
Attempted to contact patient to discuss which medications she should be on, LVM. Left call back number.

## 2019-02-13 ENCOUNTER — Ambulatory Visit (INDEPENDENT_AMBULATORY_CARE_PROVIDER_SITE_OTHER): Payer: Medicare Other | Admitting: Cardiology

## 2019-02-13 ENCOUNTER — Other Ambulatory Visit: Payer: Self-pay

## 2019-02-13 VITALS — BP 116/79 | HR 63 | Ht 64.0 in | Wt 214.0 lb

## 2019-02-13 DIAGNOSIS — Z7189 Other specified counseling: Secondary | ICD-10-CM

## 2019-02-13 DIAGNOSIS — R6889 Other general symptoms and signs: Secondary | ICD-10-CM

## 2019-02-13 DIAGNOSIS — E7800 Pure hypercholesterolemia, unspecified: Secondary | ICD-10-CM

## 2019-02-13 DIAGNOSIS — I2510 Atherosclerotic heart disease of native coronary artery without angina pectoris: Secondary | ICD-10-CM | POA: Diagnosis not present

## 2019-02-13 DIAGNOSIS — I10 Essential (primary) hypertension: Secondary | ICD-10-CM | POA: Diagnosis not present

## 2019-02-13 MED ORDER — ROSUVASTATIN CALCIUM 5 MG PO TABS: 5.0000 mg | ORAL_TABLET | Freq: Every day | ORAL | 11 refills | Status: DC

## 2019-02-13 NOTE — Patient Instructions (Signed)
Medication Instructions:  Start: Rosuvastatin 5 mg daily  *If you need a refill on your cardiac medications before your next appointment, please call your pharmacy*  Lab Work: None  Testing/Procedures: None  Follow-Up: At [facility], you and your health needs are our priority.  As part of our continuing mission to provide you with exceptional heart care, we have created designated Provider Care Teams.  These Care Teams include your primary Cardiologist (physician) and Advanced Practice Providers (APPs -  Physician Assistants and Nurse Practitioners) who all work together to provide you with the care you need, when you need it.  Your next appointment:   3 month(s)  The format for your next appointment:   In Person  Provider:   Buford Dresser, MD

## 2019-02-13 NOTE — Progress Notes (Signed)
Cardiology Office Note:    Date:  02/13/2019   ID:  [name], DOB 18-Oct-1945, MRN [id number]  PCP:  Leighton Ruff, MD  Cardiologist:  Buford Dresser, MD (prior Dr. Radford Pax)  Referring MD: Leighton Ruff, MD   CC: establish care  History of Present Illness:    [name] is a 73 y.o. female with a hx of PVCs, paroxysmal atrial tachycardia s/p ablation, hypertension, CAD who is seen as a new patient to me/prior patient of Dr. Radford Pax for the evaluation and management of her above issues.  Still feeling weak and tired, was in ER over the weekend with vomiting and diarrhea. [place] and Cr up, she feels she was dehydrated. Was told to temporarily hold her losartan. Still taking HCTZ. Her BP usually runs 130s-150s, and today is low for her. Told to hold until she sees Dr. Drema Dallas this afternoon.   Thinking about surgery, no dates/scheduled yet. One is surgery for renal cell cancer, hopefully laparoscopically. Also has known history of gallstones, wonders if that is part of her recent symptoms. Not sure if she needs surgery for this.  No specific chest pain, but feels very tired. Can only walk about 1/10th of a mile. Can climb stairs, though slowly. Has to stop due to fatigue/breathing. Has limited exercise capacity in general, has history of arthritis. Still trying to get her depression under control as well. She is tearful during our interview. Notes that she is living with her fiancee who is "losing his mind" and is very stressful for her.   Has had other symptoms, such as arm heaviness, while walking in the store. Reviewed her medications. From a cardiac perspective, is taking aspirin, beta blocker in addition to thiazide and ARB. No statin on list. We did discuss theoretical risk of ritalin with known CAD.   Denies chest pain, shortness of breath at rest. No PND, orthopnea, LE edema or unexpected weight gain. No syncope or palpitations.  Past Medical History:   Diagnosis Date  . Abscess of Bartholin's gland   . Acute vestibular neuronitis    Dr Lucia Gaskins  . ADHD (attention deficit hyperactivity disorder)   . Adverse effect of general anesthetic    felt paralyzed while receiving anesthesia  . Anxiety   . Bipolar 1 disorder ([place])   . CAD (coronary artery disease), native coronary artery    cath 05/2016 showing 50-70% stenosis in the mid LAD proximal to the first diagonal and 70-80% small OM1. FFR of LAD  not performed because of difficulty with catheter control from the right radial.   . Chronic kidney disease    kidney cancer- pt states she has elected to not have it treated.  . Depression    Bipolar disorder/goes to [facility] for meds  . Difficult intubation    told by [facility] that she was hard to intubate 15b yrs ago in [place]- surgery since then no problems  . GERD (gastroesophageal reflux disease)   . H/O echocardiogram 07/2012   [place] LVF w grade I siastolic dysfunction   . History of attention deficit disorder   . History of endometriosis   . History of pleural effusion   . Hyperlipidemia LDL goal <70 06/24/2016  . Hypertension   . 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 Memory difficulty 01/26/2017  . Migraines   . Obesity   . OSA (obstructive sleep apnea)    uses oral appliance instead of CPAP  . PAT (paroxysmal atrial tachycardia) (  [place])    s/p ablation  . PONV (postoperative nausea and vomiting)   . PTSD (post-traumatic stress disorder)   . PVC's (premature ventricular contractions)   . Renal mass 02/15/2012  . RLS (restless legs syndrome)    Dr Gwenette Greet  . rt renal ca dx'd 12/2009   no treatment/ no surg  . Vertigo     Past Surgical History:  Procedure Laterality Date  . CARDIAC ELECTROPHYSIOLOGY [place] AND ABLATION  2000s  . LEFT HEART CATH AND CORONARY ANGIOGRAPHY N/A 06/08/2016   Procedure: Left Heart Cath and Coronary Angiography;  Surgeon: Belva Crome, MD;  Location: [facility];   Service: Cardiovascular;  Laterality: N/A;  . nasoseptal reconstruction  1990s  . THYROIDECTOMY  1990  . TONSILLECTOMY     as a child  . TUBAL LIGATION  1970s  . uterine mass removal  03/2012   was found to be benign    Current Medications: Current Outpatient Medications on File Prior to Visit  Medication Sig  . aspirin EC 81 MG tablet Take 81 mg by mouth daily.  Marland Kitchen b complex vitamins tablet Take 1 tablet by mouth daily.  . diphenhydrAMINE (BENADRYL) 25 mg capsule Take 25 mg by mouth every 6 (six) hours as needed for allergies.  Marland Kitchen divalproex (DEPAKOTE ER) 500 MG 24 hr tablet Take 1 tablet (500 mg total) by mouth daily. For mood  . fluticasone (FLONASE) 50 MCG/ACT nasal spray Place 1 spray into both nostrils daily.  . hydrochlorothiazide (HYDRODIURIL) 12.5 MG tablet Take 1 tablet (12.5 mg total) by mouth daily for 3 days.  . hydrOXYzine (VISTARIL) 25 MG capsule Take 1 capsule (25 mg total) by mouth at bedtime as needed for anxiety (sleeping).  Marland Kitchen ketoconazole (NIZORAL) 2 % cream Apply 1 application topically 2 (two) times daily.  Marland Kitchen levothyroxine (SYNTHROID, LEVOTHROID) 88 MCG tablet Take 1 tablet (88 mcg total) by mouth daily at 6 (six) AM. For hypothyroidism  . LORazepam (ATIVAN) 0.5 MG tablet Take 1 tablet (0.5 mg total) by mouth 2 (two) times daily as needed for anxiety.  Marland Kitchen losartan-hydrochlorothiazide (HYZAAR) 50-12.5 MG tablet Take 1 tablet by mouth daily. Please keep upcoming appt in November before anymore refills. Thank you  . magnesium gluconate (MAGONATE) 500 MG tablet Take 500 mg by mouth daily.  . Menthol, Topical Analgesic, (BLUE GEL EX) Apply 1 application topically every 6 (six) hours as needed (restless legs).  . methylphenidate (RITALIN) 5 MG tablet Take 1 tablet (5 mg total) by mouth 2 (two) times daily with breakfast and lunch.  . methylphenidate (RITALIN) 5 MG tablet Take 1 tablet (5 mg total) by mouth 2 (two) times daily with breakfast and lunch. (Patient not taking:  Reported on 02/10/2019)  . nebivolol (BYSTOLIC) 2.5 MG tablet Take 1 tablet (2.5 mg total) by mouth daily. For high blood pressure  . Omega-3 Fatty Acids (OMEGA 3 500 PO) Take 1,000 mg by mouth.   . ondansetron (ZOFRAN ODT) 4 MG disintegrating tablet Take 1 tablet (4 mg total) by mouth every 8 (eight) hours as needed for nausea or vomiting.  . ondansetron (ZOFRAN) 8 MG tablet Take 8 mg by mouth every 8 (eight) hours as needed for nausea or vomiting.   . Papaya Enzyme CHEW Chew 1 tablet by mouth 2 (two) times daily as needed (indigestion).  Marland Kitchen rOPINIRole (REQUIP) 0.5 MG tablet TAKE 1 TABLET (0.5 MG TOTAL) BY MOUTH AT BEDTIME.  Marland Kitchen Vitamin D, Cholecalciferol, 50 MCG (2000 UT) CAPS Take 2,000  Units by mouth daily.   No current facility-administered medications on file prior to visit.      Allergies:   Geodon [ziprasidone hydrochloride], Lithium, Talwin [pentazocine], Toradol [ketorolac tromethamine], Ziprasidone, Abilify [aripiprazole], Compazine [prochlorperazine edisylate], Latuda [lurasidone hcl], Pristiq [desvenlafaxine succinate er], Rexulti [brexpiprazole], and Diflucan [fluconazole]   Social History   Tobacco Use  . Smoking status: Never Smoker  . Smokeless tobacco: Never Used  Substance Use Topics  . Alcohol use: Not Currently    Alcohol/week: 1.0 standard drinks    Types: 1 Glasses of wine per week  . Drug use: No    Family History: family history includes Alcohol abuse in her father; Allergies in her brother, daughter, father, and sister; Bipolar disorder in her father; CAD in her mother; Cancer in her paternal grandfather; Colon cancer in her paternal grandmother; Depression in her mother; Heart disease in her maternal grandfather and mother; Hypertension in her mother; OCD in her paternal grandmother; Skin cancer in her father.  ROS:   Please see the history of present illness.  Additional pertinent ROS: Constitutional: Negative for chills, fever, night sweats, unintentional  weight loss  HENT: Negative for ear pain and hearing loss.   Eyes: Negative for loss of vision and eye pain.  Respiratory: Negative for cough, sputum, wheezing.   Cardiovascular: See HPI. Gastrointestinal: Negative for melena, and hematochezia. Positive for abdominal pain, nausea, diarrhea. Genitourinary: Negative for dysuria and hematuria.  Musculoskeletal: Negative for falls and myalgias.  Skin: Negative for itching and rash.  Neurological: Negative for focal weakness, focal sensory changes and loss of consciousness.  Endo/Heme/Allergies: Does not bruise/bleed easily.     EKGs/Labs/Other Studies Reviewed:    The following studies were reviewed today: Cath 06/08/2016  50-70% stenosis in the mid LAD proximal to the first diagonal. FFR not performed because of difficulty with catheter control from the right radial.   75-80% stenosis in the midsegment of the first obtuse marginal.   Luminal irregularities in the mid RCA.   Overall normal LV function with EF percent.   Recommendations:    Aggressive risk factor modification and anti-ischemic therapy ( beta blocker, statin therapy and possibly LA nitrates) in this non-ACS subset. PCI in this setting does not meet appropriate use criteria.   If limiting symptoms on medications or convincing evidence of ischemia, consider obtuse marginal PCI and/or higher than usual risk PCI on the LAD (due to calcification and angulation).   Lexiscan 09/19/2014  Nuclear stress EF: 70%.  Horizontal ST segment depression ST segment depression of 1 mm was noted during stress in the V5, V6, aVF, II, III and I leads, and returning to baseline after 5-9 minutes of recovery.  The study is normal.  This is a low risk study.  The left ventricular ejection fraction is hyperdynamic (>65%).   Low risk stress nuclear study with normal perfusion and normal left ventricular regional and global systolic function. Abnormal ECG response may represent a "false  positive".  Echo 04/22/2013 - Left ventricle: The cavity size was normal. Systolic  function was normal. The estimated ejection fraction was  in the range of 55% to 60%. Wall motion was normal; there  were no regional wall motion abnormalities. Left  ventricular diastolic function parameters were normal.  - Pulmonary arteries: PA peak pressure: 54mm Hg (S).   EKG:  EKG is personally reviewed.  The ekg ordered 07/27/18 demonstrates SR, early R wave progression  Recent Labs: 05/26/2018: TSH 1.867 10/31/2018: B Natriuretic Peptide 81.4 02/10/2019: ALT 12;  BUN 24; Creatinine, Ser 1.03; Hemoglobin 14.9; Platelets 310; Potassium 5.2; Sodium 141  Recent Lipid Panel    Component Value Date/Time   CHOL 177 05/26/2018 0625   TRIG 68 05/26/2018 0625   HDL 47 05/26/2018 0625   CHOLHDL 3.8 05/26/2018 0625   VLDL 14 05/26/2018 0625   LDLCALC 116 (H) 05/26/2018 0625    Physical Exam:    VS:  BP 116/79   Pulse 63   Ht 5\' 4"  (1.626 m)   Wt 214 lb (97.1 kg)   SpO2 98%   BMI 36.73 kg/m     Wt Readings from Last 3 Encounters:  02/13/19 214 lb (97.1 kg)  01/17/19 217 lb 3.2 oz (98.5 kg)  10/31/18 206 lb (93.4 kg)    GEN: Well nourished, well developed in no acute distress HEENT: Normal, moist mucous membranes NECK: No JVD CARDIAC: regular rhythm, normal S1 and S2, no rubs or gallops. No murmurs. VASCULAR: Radial and DP pulses 2+ bilaterally. No carotid bruits RESPIRATORY:  Clear to auscultation without rales, wheezing or rhonchi  ABDOMEN: Soft, non-tender, non-distended MUSCULOSKELETAL:  Ambulates independently SKIN: Warm and dry, no edema NEUROLOGIC:  Alert and oriented x 3. No focal neuro deficits noted. PSYCHIATRIC:  Normal affect    ASSESSMENT:    1. Coronary artery disease involving native coronary artery of native heart without angina pectoris   2. Exercise intolerance   3. Essential hypertension   4. Pure hypercholesterolemia   5. Cardiac risk counseling   6.  Counseling on health promotion and disease prevention    PLAN:    Coronary disease by cath 2018, exercise intolerance -we discussed this at length. She does not have chest pain, and she can do >4 METs of activity, so additional testing not needed prior to potential surgeries -however, she is not optimized from a CV standpoint. She is taking aspirin, but she is not on a statin. We discussed at length, see below -her exercise intolerance is somewhat unclear to me. We discussed what to monitor for, red flag warning signs that need immediate medical attention -she is on beta blocker for antianginal -based on cath in 2018, would aim for medical management as much as tolerated. If not improved on optimal medical management, then consider cath of PCI.  Hypercholesterolemia: LDL goal <70, last was 116 on 05/26/18 -discussed pathology of cholesterol, how plaques form, that MI/CVA result commonly from acute plaque rupture and not gradual stenosis. Discussed mechanism of statin to both decrease plaque accumulation and stabilize plaque that is already present. -we discussed risk/benefit of statin at length. She has a long history of med intolerances. Discussed what may/may not be statin side effects -we will start very low dose rosuvastatin and slowly uptitrate to make sure she tolerates. Starting 5 mg rosuvastatin today -check lipids/lfts in 3 mos  Hypertension: recently low with bout of GI issues -has follow up today (she believes also with labs) with Dr. Drema Dallas to determine what to restart -had been on losartan-HCTZ 50-12.5 mg in the last, as well as bystolic 2.5 mg daily  Cardiac risk counseling and prevention recommendations: -recommend heart healthy/Mediterranean diet, with whole grains, fruits, vegetable, fish, lean meats, nuts, and olive oil. Limit salt. -recommend moderate walking, 3-5 times/week for 30-50 minutes each session. Aim for at least 150 minutes.week. Goal should be pace of 3  miles/hours, or walking 1.5 miles in 30 minutes -recommend avoidance of tobacco products. Avoid excess alcohol. -ASCVD risk score: The 10-year ASCVD risk score Mikey Bussing DC Brooke Bonito.,  et al., 2013) is: 12.2%   Values used to calculate the score:     Age: 69 years     Sex: Female     Is Non-Hispanic African American: No     Diabetic: No     Tobacco smoker: No     Systolic Blood Pressure: [id number] mmHg     Is BP treated: Yes     HDL Cholesterol: 47 mg/dL     Total Cholesterol: 177 mg/dL    Plan for follow up: 3 mos  TIME SPENT WITH PATIENT: 30 minutes of direct patient care. More than 50% of that time was spent on coordination of care and counseling regarding symptoms, prior tests, recommendations for management.  Buford Dresser, MD, PhD [place]  [facility]   Medication Adjustments/Labs and Tests Ordered: Current medicines are reviewed at length with the patient today.  Concerns regarding medicines are outlined above.  No orders of the defined types were placed in this encounter.  Meds ordered this encounter  Medications  . rosuvastatin (CRESTOR) 5 MG tablet    Sig: Take 1 tablet (5 mg total) by mouth daily.    Dispense:  30 tablet    Refill:  11    Patient Instructions  Medication Instructions:  Start: Rosuvastatin 5 mg daily  *If you need a refill on your cardiac medications before your next appointment, please call your pharmacy*  Lab Work: None  Testing/Procedures: None  Follow-Up: At [facility], you and your health needs are our priority.  As part of our continuing mission to provide you with exceptional heart care, we have created designated Provider Care Teams.  These Care Teams include your primary Cardiologist (physician) and Advanced Practice Providers (APPs -  Physician Assistants and Nurse Practitioners) who all work together to provide you with the care you need, when you need it.  Your next appointment:   3 month(s)  The format for your next  appointment:   In Person  Provider:   Buford Dresser, MD    Signed, Buford Dresser, MD PhD 02/13/2019  [place]

## 2019-02-18 ENCOUNTER — Encounter: Payer: Self-pay | Admitting: Cardiology

## 2019-02-18 DIAGNOSIS — E7800 Pure hypercholesterolemia, unspecified: Secondary | ICD-10-CM | POA: Insufficient documentation

## 2019-02-18 HISTORY — DX: Pure hypercholesterolemia, unspecified: E78.00

## 2019-02-19 ENCOUNTER — Telehealth: Payer: Self-pay | Admitting: Cardiology

## 2019-02-19 DIAGNOSIS — R002 Palpitations: Secondary | ICD-10-CM

## 2019-02-19 NOTE — Telephone Encounter (Signed)
Spoke with pt and over the last 4 days has noted palpitations multiple times a day Per pt also notes weakness and fatigue pt has not changed caffeine intake or taken anything OTC Pt does notes some stressors  at this time not sure if this is triggering Pt currenlty taking Bystolic 2.5 mg qd Will forward to Dr Harrell Gave for review and recommendations./cy

## 2019-02-19 NOTE — Telephone Encounter (Signed)
Patient c/o Palpitations:  High priority if patient c/o lightheadedness, shortness of breath, or chest pain  1) How long have you had palpitations/irregular HR/ Afib? Are you having the symptoms now?  4 days patient is not right at the monment  2) Are you currently experiencing lightheadedness, SOB or CP? SOB   3) Do you have a history of afib (atrial fibrillation) or irregular heart rhythm? No   Have you checked your BP or HR? (document readings if available): yes BP 135/76 HR 70  4) Are you experiencing any other symptoms? Patient feeling weak and tired

## 2019-02-19 NOTE — Telephone Encounter (Signed)
Lm to call back ./cy 

## 2019-02-19 NOTE — Telephone Encounter (Signed)
Per pt this has been addressed .[name]

## 2019-02-20 ENCOUNTER — Telehealth: Payer: Self-pay | Admitting: Radiology

## 2019-02-20 NOTE — Telephone Encounter (Signed)
Left message to call back  

## 2019-02-20 NOTE — Telephone Encounter (Signed)
Pt updated and verbalized understanding. Monitor order placed and message sent to [facility].

## 2019-02-20 NOTE — Telephone Encounter (Signed)
We did discuss the significant stressors in her life as potential triggers. If the palpitations are frequent, we can set her up with a 7 day Zio to evaluate these. She needs to know that the results aren't immediate--can take several weeks in total from when the monitor is ordered. For now I would continue the bystolic until the monitor results are in. Lars Mage, can you send her a monitor? Thanks.

## 2019-02-20 NOTE — Telephone Encounter (Signed)
Enrolled patient for a 7 day Zio monitor to be mailed.

## 2019-02-27 ENCOUNTER — Ambulatory Visit (INDEPENDENT_AMBULATORY_CARE_PROVIDER_SITE_OTHER): Payer: Medicare Other

## 2019-02-27 DIAGNOSIS — R002 Palpitations: Secondary | ICD-10-CM | POA: Diagnosis not present

## 2019-02-27 NOTE — Telephone Encounter (Signed)
Patient called around 3pm today and requested to speak with a manager to express her dissatisfaction she hadn't received her monitor in the mail yet.  I spoke with [name] who confirmed the she was enrolled and monitor was mailed 1 days after the order was placed by Dr. Harrell Gave. [name] tracked the monitor and confirmed it was delivered at 10:41am today (1 week after we enrolled her).  I called the patient back at 4:15pm and she confirmed that she did receive her monitor and would be hooking it up today.   Her frustration was heightened by her anxiety and desire to learn more about why she's not feeling well. She suggested the practice offer for patients to come in and pick up a monitor instead of waiting on them to be mailed. I expressed that due to [place], we have processes in place to reduce unnecessary exposure but her feedback and suggestion are very appreciated. [name] looks forward to getting her results as quickly as possible so that she may have some additional insights into her health/why she doesn't feel well.

## 2019-03-01 ENCOUNTER — Ambulatory Visit (INDEPENDENT_AMBULATORY_CARE_PROVIDER_SITE_OTHER): Payer: Medicare Other | Admitting: Psychiatry

## 2019-03-01 ENCOUNTER — Encounter (HOSPITAL_COMMUNITY): Payer: Self-pay | Admitting: Psychiatry

## 2019-03-01 ENCOUNTER — Other Ambulatory Visit: Payer: Self-pay

## 2019-03-01 DIAGNOSIS — F3130 Bipolar disorder, current episode depressed, mild or moderate severity, unspecified: Secondary | ICD-10-CM

## 2019-03-01 DIAGNOSIS — F4310 Post-traumatic stress disorder, unspecified: Secondary | ICD-10-CM

## 2019-03-01 MED ORDER — METHYLPHENIDATE HCL 5 MG PO TABS: 5.0000 mg | ORAL_TABLET | Freq: Two times a day (BID) | ORAL | 0 refills | Status: DC

## 2019-03-01 MED ORDER — LORAZEPAM 0.5 MG PO TABS: 0.5000 mg | ORAL_TABLET | Freq: Two times a day (BID) | ORAL | 2 refills | Status: DC | PRN

## 2019-03-01 NOTE — Progress Notes (Signed)
This note is not being shared with the patient for the following reason: To prevent harm (release of this note would result in harm to the life or physical safety of the patient or another).  Virtual Visit via Telephone Note  I connected with [name]  on 03/01/19 at 10:15 AM EST by telephone and verified that I am speaking with the correct person using two identifiers.  Location: Patient: home Provider: office   I discussed the limitations, risks, security and privacy concerns of performing an evaluation and management service by telephone and the availability of in person appointments. I also discussed with the patient that there may be a patient responsible charge related to this service. The patient expressed understanding and agreed to proceed.   History of Present Illness: "I am doing a little better". She is using the UV light lamp, taking the Ritalin and trying to get our more. She is still "emotionally in a bad place". [name] desperately wants to move. Her fiance is not doing well and it is very stressful. [name] admits she gets irritated with him. She is now working with her therapist twice/week. It does not seem to be helping her depression. Her life is "flat" and she is lonely. "The darkness of the depression is going away". She is starting to feel a little hope and mild motivation. [name] is not spending as much time in bed. The Ritalin is helping a lot and states it is the only thing that gives her peace of mind and motivation. It makes her focused and clear headed. She can tell when it wears off. [name] is crying more often due to therapy. Today is her birthday and she woke up and told herself that she was going to be good to herself today. Her sleep remains broken. She is able to fall back asleep when she wakes up in the middle of the night. She stopped taking Vistaril because of the SE of next day fatigue. She does not feel the need for sleep meds. [name] is having daily  palpations and is working with her cardiologist. Her anxiety is up and she is taking Ativan 1mg  BID to help her relax a little. She didn't think about running out and thought she could get more when ever needed. Her health is a big stressors. [name] denies SI/HI. She denies manic or hypomanic like symptoms. [name] and her therapist believe that her ADHD is causing her symptoms and does not have Bipolar d/o. She would like to get [place] but due to Bipolar diagnosis she can't. Pt stopped taking the Depakote 1 month ago and notes no change in mood or worsening irritability.     Observations/Objective: I spoke with [name] on the phone.  Pt was calm, pleasant and cooperative.  Pt was engaged in the conversation and answered questions appropriately.  Speech was clear and coherent with normal tone and volume. Rate is halting.  Mood is depressed and anxious, affect is congruent. Thought processes are coherent and circumstantial.  Thought content is with ruminations.  Pt denies SI/HI.   Pt denies auditory and visual hallucinations and did not appear to be responding to internal stimuli.  Memory and concentration are fair.  Fund of knowledge and use of language are average.  Insight and judgment are fair.  I am unable to comment on psychomotor activity, general appearance, hygiene, or eye contact as I was unable to physically see the patient on the phone.  Vital signs not available since interview  conducted virtually.    I reviewed the information below on 03/01/2019 and have updated it Assessment and Plan: Bipolar I d/o- depressed, severe without psychotic features; GAD; PTSD; Cluster B personality d/o  Status of current symptoms: mild improvement in depression and ongoing anxiety  Meds:  D/c Depakote ER 500mg  po qLunch. At 1000mg  she had tremors. I shared by concern about her stopping the medication and pt verbalized understanding   Ativan 0.5mg  po BID prn anxiety- refused request to increase dose     Ritalin 5mg  po BID for adjunctive depression treatment   D/c Vistaril due to SE   Advised her to set appt with sleep doc regarding her sleep apnea   Pt is working with a therapist 2x/week   Pt is working on finding a good support person   Follow Up Instructions: In 8 weeks or sooner if needed   I discussed the assessment and treatment plan with the patient. The patient was provided an opportunity to ask questions and all were answered. The patient agreed with the plan and demonstrated an understanding of the instructions.   The patient was advised to call back or seek an in-person evaluation if the symptoms worsen or if the condition fails to improve as anticipated.  I provided 30 minutes of non-face-to-face time during this encounter.   Brooke Cradle, MD

## 2019-03-02 ENCOUNTER — Other Ambulatory Visit: Payer: Self-pay | Admitting: Cardiology

## 2019-03-02 NOTE — Telephone Encounter (Signed)
She was going to follow up with her primary care Dr. Drema Frank regarding plan for her restart of blood pressure medications. I would recommend she ask Dr. Drema Frank to refill this if recommended, or if we can get Dr. Drema Frank most recent note then I will know what they discussed so that I can refill. Thanks.

## 2019-03-05 NOTE — Telephone Encounter (Signed)
Left message to call back  

## 2019-03-06 ENCOUNTER — Other Ambulatory Visit: Payer: Self-pay | Admitting: Cardiology

## 2019-03-06 NOTE — Telephone Encounter (Signed)
Left message to call back  

## 2019-03-06 NOTE — Telephone Encounter (Signed)
Pt updated and verbalized understanding.  

## 2019-03-19 ENCOUNTER — Telehealth: Payer: Self-pay | Admitting: Cardiology

## 2019-03-19 NOTE — Telephone Encounter (Signed)
Patient calling to see why the results from her heart monitor have not been received yet, she is asking to speak with a nurse.

## 2019-03-19 NOTE — Telephone Encounter (Signed)
Spoke with pt who is eager to have her heart monitor results reviewed by dr. Harrell Gave. Informed pt that results not yet available in Epic. Pt is very frustrated over wait time for results stating she continues to have palpitations. Discussed reasons for delay. Pt requests appt with Dr. Harrell Gave today or tomorrow. Informed pt that Dr. Harrell Gave out this week but APPs are available. Pt agreeable with seeing APP and having FYI routed to Dr. Harrell Gave. Informed her that results not available in Epic for review but may be able to be printed out from website and provided to Dr. Harrell Gave and also to APP for her appt. Pt appt set for 12/30 with Purcell Nails, DNP at 10:45 am.   Triage nurse able to locate final report on [facility] website. Printed copies placed in Jory Sims, DNP and Dr. Judeth Cornfield mailboxes

## 2019-03-20 NOTE — Progress Notes (Signed)
Virtual Visit via Video Note   This visit type was conducted due to national recommendations for restrictions regarding the COVID-19 Pandemic (e.g. social distancing) in an effort to limit this patient's exposure and mitigate transmission in our community.  Due to her co-morbid illnesses, this patient is at least at moderate risk for complications without adequate follow up.  This format is felt to be most appropriate for this patient at this time.  All issues noted in this document were discussed and addressed.  A limited physical exam was performed with this format.  Please refer to the patient's chart for her consent to telehealth for [facility].   Date:  03/21/2019   ID:  [name], DOB 1945/07/19, MRN [id number]  Patient Location: Home Provider Location: Office  PCP:  Leighton Ruff, MD  Cardiologist:  Buford Dresser, MD  Electrophysiologist:  None   Evaluation Performed:  Follow-Up Visit  Chief Complaint:  Palpitations   History of Present Illness:    [name] is a 73 y.o. female for ongoing assessment and management of CAD with most recent cardiac cath 06/06/2016 revealing 50%-70% stenosis in the mid LAD, proximal to the first diagonal.FFR was not performed due to difficulty with catheter control from the right radial. There was 75%-*0% stenosis of the mid segment of the 1st OM. Luminal irregularities in the mid RCA. LV fx was normal.   She was seen by Dr. Harrell Gave on 02/13/2019 with complaints of frequent PVC's, history of paroxysmal atrial tachycardia s/p ablation, hypertension. She also had complaints of feeling weak and tired. HCTZ was discontinued due to dehydration.  She also has history significant for depression and anxiety and was tearful when seen last. She admitted to being under a lot of stress with in relationship with fiancee.  A cardiac monitor was ordered. She received it on 02/27/2019. She did call our office to express  dissatisfaction that she had not received her monitor, but got it later that day. She admitted to be very anxious about her health status.   [name] was originally scheduled as an office visit but requested to be changed to a virtual visit.  She continues to be very tearful and depressed on this video visit.  She continues to have some palpitations which usually occur in the evening.  She states she has been recently diagnosed with renal cell carcinoma.  She also describes a home situation which is very stressful, stating that she has a pulmonary support there.  She denies any chest pain that can feel some of the palpitations at times especially when she feels overly emotional or stressed.  She states she usually goes to bed and lays down.  She notices the most when she is laying on her left side.  Due to her significant amount of depression, she is not active and often sleeps.  She is seeing a psychiatrist and a psychotherapist who are helping her with medication therapy.  She has also about to join a class concerning optimizing coping skills.  She is looking forward to this.   The patient does not have symptoms concerning for COVID-19 infection (fever, chills, cough, or new shortness of breath).    Past Medical History:  Diagnosis Date  . Abscess of Bartholin's gland   . Acute vestibular neuronitis    Dr Lucia Gaskins  . ADHD (attention deficit hyperactivity disorder)   . Adverse effect of general anesthetic    felt paralyzed while receiving anesthesia  . Anxiety   .  Bipolar 1 disorder ([place])   . CAD (coronary artery disease), native coronary artery    cath 05/2016 showing 50-70% stenosis in the mid LAD proximal to the first diagonal and 70-80% small OM1. FFR of LAD  not performed because of difficulty with catheter control from the right radial.   . Chronic kidney disease    kidney cancer- pt states she has elected to not have it treated.  . Depression    Bipolar disorder/goes to [facility] for meds  . Difficult intubation    told by [facility] that she was hard to intubate 15b yrs ago in [place]- surgery since then no problems  . GERD (gastroesophageal reflux disease)   . H/O echocardiogram 07/2012   [place] LVF w grade I siastolic dysfunction   . History of attention deficit disorder   . History of endometriosis   . History of pleural effusion   . Hyperlipidemia LDL goal <70 06/24/2016  . Hypertension   . 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 Memory difficulty 01/26/2017  . Migraines   . Obesity   . OSA (obstructive sleep apnea)    uses oral appliance instead of CPAP  . PAT (paroxysmal atrial tachycardia) (HCC)    s/p ablation  . PONV (postoperative nausea and vomiting)   . PTSD (post-traumatic stress disorder)   . PVC's (premature ventricular contractions)   . Renal mass 02/15/2012  . RLS (restless legs syndrome)    Dr Gwenette Greet  . rt renal ca dx'd 12/2009   no treatment/ no surg  . Vertigo    Past Surgical History:  Procedure Laterality Date  . CARDIAC ELECTROPHYSIOLOGY [place] AND ABLATION  2000s  . LEFT HEART CATH AND CORONARY ANGIOGRAPHY N/A 06/08/2016   Procedure: Left Heart Cath and Coronary Angiography;  Surgeon: Belva Crome, MD;  Location: [facility];  Service: Cardiovascular;  Laterality: N/A;  . nasoseptal reconstruction  1990s  . THYROIDECTOMY  1990  . TONSILLECTOMY     as a child  . TUBAL LIGATION  1970s  . uterine mass removal  03/2012   was found to be benign     Current Meds  Medication Sig  . aspirin EC 81 MG tablet Take 81 mg by mouth daily.  Marland Kitchen b complex vitamins tablet Take 1 tablet by mouth daily.  Marland Kitchen BYSTOLIC 2.5 MG tablet TAKE 1 TABLET BY MOUTH EVERY DAY  . diphenhydrAMINE (BENADRYL) 25 mg capsule Take 25 mg by mouth every 6 (six) hours as needed for allergies.  . fluticasone (FLONASE) 50 MCG/ACT nasal spray Place 2 sprays into both nostrils daily.  Marland Kitchen ketoconazole (NIZORAL) 2 % cream Apply 1  application topically 2 (two) times daily.  Marland Kitchen levothyroxine (SYNTHROID, LEVOTHROID) 88 MCG tablet Take 1 tablet (88 mcg total) by mouth daily at 6 (six) AM. For hypothyroidism  . LORazepam (ATIVAN) 0.5 MG tablet Take 1 tablet (0.5 mg total) by mouth 2 (two) times daily as needed for anxiety.  Marland Kitchen losartan-hydrochlorothiazide (HYZAAR) 50-12.5 MG tablet Take 1 tablet by mouth daily. Please keep upcoming appt in November before anymore refills. Thank you (Patient taking differently: Take 0.5 tablets by mouth daily. Please keep upcoming appt in November before anymore refills. Thank you)  . magnesium gluconate (MAGONATE) 500 MG tablet Take 500 mg by mouth daily.  . Menthol, Topical Analgesic, (BLUE GEL EX) Apply 1 application topically every 6 (six) hours as needed (restless legs).  . methylphenidate (RITALIN) 5 MG tablet Take 1  tablet (5 mg total) by mouth 2 (two) times daily with breakfast and lunch.  . Omega-3 Fatty Acids (OMEGA 3 500 PO) Take 1,000 mg by mouth.   . ondansetron (ZOFRAN ODT) 4 MG disintegrating tablet Take 1 tablet (4 mg total) by mouth every 8 (eight) hours as needed for nausea or vomiting.  . ondansetron (ZOFRAN) 8 MG tablet Take 8 mg by mouth every 8 (eight) hours as needed for nausea or vomiting.   . Papaya Enzyme CHEW Chew 1 tablet by mouth 2 (two) times daily as needed (indigestion).  Marland Kitchen rOPINIRole (REQUIP) 0.5 MG tablet TAKE 1 TABLET (0.5 MG TOTAL) BY MOUTH AT BEDTIME.  . rosuvastatin (CRESTOR) 5 MG tablet Take 1 tablet (5 mg total) by mouth daily.  . Vitamin D, Cholecalciferol, 50 MCG (2000 UT) CAPS Take 2,000 Units by mouth daily.     Allergies:   Geodon [ziprasidone hydrochloride], Lithium, Talwin [pentazocine], Toradol [ketorolac tromethamine], Ziprasidone, Abilify [aripiprazole], Compazine [prochlorperazine edisylate], Latuda [lurasidone hcl], Pristiq [desvenlafaxine succinate er], Rexulti [brexpiprazole], and Diflucan [fluconazole]   Social History   Tobacco Use  .  Smoking status: Never Smoker  . Smokeless tobacco: Never Used  Substance Use Topics  . Alcohol use: Not Currently    Alcohol/week: 1.0 standard drinks    Types: 1 Glasses of wine per week  . Drug use: No     Family Hx: The patient's family history includes Alcohol abuse in her father; Allergies in her brother, daughter, father, and sister; Bipolar disorder in her father; CAD in her mother; Cancer in her paternal grandfather; Colon cancer in her paternal grandmother; Depression in her mother; Heart disease in her maternal grandfather and mother; Hypertension in her mother; OCD in her paternal grandmother; Skin cancer in her father.  ROS:   Please see the history of present illness.    All other systems reviewed and are negative.   Prior CV studies:   The following studies were reviewed today:  Cardiac monitor: 02/26/2021- 03/07/2019 Miimum heart rate 43 bpm, maximal heart rate of 193 bpm with average heart rate of 66 bpm, predominant underlying rhythm was sinus rhythm.  She had 1 supraventricular tachycardia which lasted approximately 8 beats.  She also had ventricular trigeminy which was rare.  Labs/Other Tests and Data Reviewed:    EKG:  No ECG reviewed.  Recent Labs: 05/26/2018: TSH 1.867 10/31/2018: B Natriuretic Peptide 81.4 02/10/2019: ALT 12; BUN 24; Creatinine, Ser 1.03; Hemoglobin 14.9; Platelets 310; Potassium 5.2; Sodium 141   Recent Lipid Panel Lab Results  Component Value Date/Time   CHOL 177 05/26/2018 06:25 AM   TRIG 68 05/26/2018 06:25 AM   HDL 47 05/26/2018 06:25 AM   CHOLHDL 3.8 05/26/2018 06:25 AM   LDLCALC 116 (H) 05/26/2018 06:25 AM    Wt Readings from Last 3 Encounters:  03/21/19 213 lb (96.6 kg)  02/13/19 214 lb (97.1 kg)  01/17/19 217 lb 3.2 oz (98.5 kg)     Objective:    Vital Signs:  BP 138/83   Pulse 69   Ht 5\' 4"  (1.626 m)   Wt 213 lb (96.6 kg)   BMI 36.56 kg/m    VITAL SIGNS:  reviewed GEN:  no acute distress EYES:  sclerae anicteric,  EOMI - Extraocular Movements Intact RESPIRATORY:  normal respiratory effort, symmetric expansion NEURO:  alert and oriented x 3, no obvious focal deficit PSYCH:  Tearful, easily emotional and depressed.   ASSESSMENT & PLAN:    1.  Palpitations: I have reviewed her  serial cardiac monitor with her including the episode of SVT and also PVCs.  She does have a good bit of artifact that was noted on some of the strips.  I did point out to her as well over the video.  She is given reassurance that these episodes are very benign.  If the rapid heart rate becomes more prominent and lengthy, may need to adjust her medications.  She is taking Ritalin which may be contributing to some of the arrhythmias.  However she states that she has noticed no change in her arrhythmias or palpitations since beginning this medication.  Continue Bystolic 2.5 mg daily.  I have requested whether or not she has had her vitamin D level checked recently, and she states that she has and is now on 2000 units daily per her PCP.  We will see her again in 6 months unless symptoms worsen.  There is no evidence of PAT on review of cardiac monitoring documentation.  2.  Hypothyroidism: Currently on levothyroxine followed by PCP  3.  Depression and anxiety: She is being followed by psychiatry and also has a therapist.  She is being managed concerning medication adjustments.  She is also going to join a class or coping skills enhancement after the first of the year.  4.Renal Cell Carcinoma: Followed by oncology and nephrology.   COVID-19 Education: The signs and symptoms of COVID-19 were discussed with the patient and how to seek care for testing (follow up with PCP or arrange E-visit).  The importance of social distancing was discussed today.  Time:   Today, I have spent 20 inutes with the patient with telehealth technology discussing the above problems.     Medication Adjustments/Labs and Tests Ordered: Current medicines are  reviewed at length with the patient today.  Concerns regarding medicines are outlined above.   Tests Ordered: No orders of the defined types were placed in this encounter.   Medication Changes: No orders of the defined types were placed in this encounter.   Disposition:  Follow up 6 months.   Signed, Phill Myron. West Pugh, ANP, AACC  03/21/2019 11:59 AM    [facility]

## 2019-03-20 NOTE — Telephone Encounter (Signed)
Returned patients call and apologized for the delay. We received her results from the company yesterday and they were uploaded today. Explained mail has been running slow due to the holidays. She thanked me for my call and understands she has an appt tom.

## 2019-03-20 NOTE — Telephone Encounter (Signed)
Follow Up  Patient is calling in again about monitor results. Please give patient a call back today to go over monitor results.

## 2019-03-21 ENCOUNTER — Encounter: Payer: Self-pay | Admitting: Adult Health

## 2019-03-21 ENCOUNTER — Telehealth (INDEPENDENT_AMBULATORY_CARE_PROVIDER_SITE_OTHER): Payer: Medicare Other | Admitting: Adult Health

## 2019-03-21 VITALS — BP 138/83 | HR 69 | Ht 64.0 in | Wt 213.0 lb

## 2019-03-21 DIAGNOSIS — R002 Palpitations: Secondary | ICD-10-CM

## 2019-03-21 DIAGNOSIS — F329 Major depressive disorder, single episode, unspecified: Secondary | ICD-10-CM

## 2019-03-21 DIAGNOSIS — F32A Depression, unspecified: Secondary | ICD-10-CM

## 2019-03-21 DIAGNOSIS — C649 Malignant neoplasm of unspecified kidney, except renal pelvis: Secondary | ICD-10-CM

## 2019-03-21 DIAGNOSIS — I10 Essential (primary) hypertension: Secondary | ICD-10-CM

## 2019-03-21 DIAGNOSIS — I471 Supraventricular tachycardia: Secondary | ICD-10-CM

## 2019-03-21 NOTE — Patient Instructions (Signed)
Medication Instructions:  Continue current medications  *If you need a refill on your cardiac medications before your next appointment, please call your pharmacy*  Lab Work: None Ordered  Testing/Procedures: None Ordered  Follow-Up: At [facility], you and your health needs are our priority.  As part of our continuing mission to provide you with exceptional heart care, we have created designated Provider Care Teams.  These Care Teams include your primary Cardiologist (physician) and Advanced Practice Providers (APPs -  Physician Assistants and Nurse Practitioners) who all work together to provide you with the care you need, when you need it.  Your next appointment:   6 month(s)  The format for your next appointment:   In Person  Provider:   Buford Dresser, MD

## 2019-03-28 ENCOUNTER — Ambulatory Visit: Payer: Medicare Other | Admitting: Pulmonary Disease

## 2019-03-28 ENCOUNTER — Ambulatory Visit: Payer: Medicare Other | Admitting: Acute Care

## 2019-03-29 ENCOUNTER — Other Ambulatory Visit: Payer: Self-pay | Admitting: *Deleted

## 2019-04-04 ENCOUNTER — Other Ambulatory Visit: Payer: Self-pay | Admitting: Cardiology

## 2019-04-05 ENCOUNTER — Ambulatory Visit (INDEPENDENT_AMBULATORY_CARE_PROVIDER_SITE_OTHER): Payer: Medicare Other | Admitting: Psychiatry

## 2019-04-05 ENCOUNTER — Encounter (HOSPITAL_COMMUNITY): Payer: Self-pay | Admitting: Psychiatry

## 2019-04-05 ENCOUNTER — Other Ambulatory Visit: Payer: Self-pay

## 2019-04-05 DIAGNOSIS — F314 Bipolar disorder, current episode depressed, severe, without psychotic features: Secondary | ICD-10-CM | POA: Diagnosis not present

## 2019-04-05 DIAGNOSIS — F4310 Post-traumatic stress disorder, unspecified: Secondary | ICD-10-CM

## 2019-04-05 DIAGNOSIS — F3130 Bipolar disorder, current episode depressed, mild or moderate severity, unspecified: Secondary | ICD-10-CM

## 2019-04-05 DIAGNOSIS — F411 Generalized anxiety disorder: Secondary | ICD-10-CM

## 2019-04-05 MED ORDER — LORAZEPAM 0.5 MG PO TABS: 0.5000 mg | ORAL_TABLET | Freq: Three times a day (TID) | ORAL | 0 refills | Status: DC | PRN

## 2019-04-05 MED ORDER — DIVALPROEX SODIUM ER 500 MG PO TB24: 500.0000 mg | ORAL_TABLET | Freq: Every day | ORAL | 0 refills | Status: DC

## 2019-04-05 MED ORDER — METHYLPHENIDATE HCL 5 MG PO TABS: 5.0000 mg | ORAL_TABLET | Freq: Two times a day (BID) | ORAL | 0 refills | Status: DC

## 2019-04-05 NOTE — Telephone Encounter (Signed)
This is Dr. Christopher's pt 

## 2019-04-05 NOTE — Progress Notes (Signed)
  Virtual Visit via Telephone Note  I connected with [name]  on 04/05/19 at 11:15 AM EST by telephone and verified that I am speaking with the correct person using two identifiers.  Location: Patient: home Provider: office   I discussed the limitations, risks, security and privacy concerns of performing an evaluation and management service by telephone and the availability of in person appointments. I also discussed with the patient that there may be a patient responsible charge related to this service. The patient expressed understanding and agreed to proceed.   History of Present Illness: [name] shares that her depression is getting worse. Her irritability is really bad. She feels stuck. She has no desire to go out or do anything. [name] is crying all the time. Her anxiety is high. The Ativan 0.5mg  is not effective. She has been taking 1mg  and it helps to calm her. [name] feels she needs to start medication. She is still living at her ex-fiances house, as she has not found a place to move due to finances. It is very stressful. She shares that she has no support at all.  [name] does not feel that her current therapist is a good fit and is looking for a new one. She tried to do zoom classes with [facility] but zoom was not working will so she plans to go in person next week. Ritalin does clear her head and gives her a little energy. She pushes herself to walk some. She listens to relaxation tapes or classical music to calm down. She denies any financial extravagance, impulsive behaviors recently. [name] denies SI/HI.     Observations/Objective: I spoke with [name] on the phone.  Pt was calm, pleasant and cooperative.  Pt was engaged in the conversation and answered questions appropriately.  Speech was clear and coherent with normal rate, tone and volume.  Mood is depressed and anxious, affect is congruent. Thought processes are coherent, goal oriented and intact.  Thought content is  logical.  Pt denies SI/HI.   Pt denies auditory and visual hallucinations and did not appear to be responding to internal stimuli.  Memory and concentration are good.  Fund of knowledge and use of language are average.  Insight and judgment are fair.  I am unable to comment on psychomotor activity, general appearance, hygiene, or eye contact as I was unable to physically see the patient on the phone.  Vital signs not available since interview conducted virtually.     Assessment and Plan: Bipolar 1 d/o- depressed, severe without psychotic features; GAD; PTSD  Status of current symptoms: worsening depression and anxiety  Restart Depakote ER 500mg  po qD- at 1000mg  she experienced tremors. No refill today because she has about 1 month of pills at home  Increase Ativan 0.5mg  po TID prn anxiety  Ritalin 5mg  po BID for adjunctive depression treatment  I encouraged her to find a new therapist  Follow Up Instructions: In 4-6 weeks or sooner if needed   I discussed the assessment and treatment plan with the patient. The patient was provided an opportunity to ask questions and all were answered. The patient agreed with the plan and demonstrated an understanding of the instructions.   The patient was advised to call back or seek an in-person evaluation if the symptoms worsen or if the condition fails to improve as anticipated.  I provided 20 minutes of non-face-to-face time during this encounter.   [name], MD

## 2019-04-07 ENCOUNTER — Ambulatory Visit (HOSPITAL_COMMUNITY): Payer: Medicare Other | Admitting: Psychiatry

## 2019-04-11 NOTE — Progress Notes (Signed)
Virtual Visit via Telephone Note  I connected with@ on 04/12/19 at 10:00 AM EST by telephone and verified that I am speaking with the correct person using two identifiers.  Location: Patient: Home Provider: Office [facility] - [id number], [address], [place], [place] [address]   I discussed the limitations, risks, security and privacy concerns of performing an evaluation and management service by telephone and the availability of in person appointments. I also discussed with the patient that there may be a patient responsible charge related to this service. The patient expressed understanding and agreed to proceed.  Patient consented to consult via telephone: Yes People present and their role in pt care: Pt     History of Present Illness:  74 year old female never smoker followed in our office for restless leg syndrome and obstructive sleep apnea (patient not managed on CPAP, CPAP taken back by DME due to noncompliance).  Patient use a dental appliance in the past but did not tolerate this well.  Her restless leg improved significantly when she was tapered off of Prozac.  PMH: Bipolar disorder, anxiety, depression, ADHD, diastolic heart failure, hypertension, PTSD, memory difficulty, GERD, CAD Smoker/ Smoking History: Never smoker Maintenance:  None  Pt of: Patient Dr. Elsworth Soho  04/12/2019  - Visit   74 year old female followed in our office for obstructive sleep apnea restless leg syndrome.  Patient requested a office visit to discuss worsening symptoms of restless leg.  Unfortunately patient did not sleep well last night so she changed the visit from an in office visit to a televisit based off of this.  I was able to reach the patient via telephone to further discuss this.  This would be a 62-month follow-up as she was last seen in our office in January/2020 with Dr. Elsworth Soho.  At that time Dr. Elsworth Soho suggested that she work on weight loss to see if she would qualify for the inspire  device for management of her obstructive sleep apnea.  Patient has been tested for sleep apnea 3 times.  Each time she has been unable to maintain CPAP therapy.  She is also used dental appliance without success.  Patient reports that she feels that her restless leg syndrome has worsened.  Has worsened to the point where she has increased her Requip to 2 tablets a day (a total of 1 mg).  She is taking 1 0.5 mg tablet before dinner.  She is taking 1 0.5 mg tablet before bedtime.  She increases on her own.  She frequently naps and she continues to have symptoms even when she is napping.  She denies weight loss.  Her current weight right now is estimated to be around 210 pounds.   Tests:   NPSG 2004: AHI 10/hr PSG 07/2015 (214 lbs) AHI 23/h, mild PLMs 23/h but no sig arousals  CPAP titration 09/2015 9 cm  HST 11/2016 AHI 12/h, 7h TST  12/2016 Spirometry - poor effort , ratio 68, FEV1 of 67% and FVC of 75% suggesting mild obstruction   Imaging: LONG TERM MONITOR (3-14 DAYS)  Result Date: 04/02/2019 7 days of data recorded on Zio monitor. Patient had a min HR of 43 bpm, max HR of 193 bpm, and avg HR of 6 bpm. Predominant underlying rhythm was Sinus Rhythm. No VT, atrial fibrillation, high degree block, or pauses noted. One true SVT noted, 8 beats at 193 bpm. Two other events labeled as SVT are either sinus (1) or difficult to determine (1). Isolated atrial and ventricular  ectopy was rare (<1%). There were 42 triggered events, the vast majority of which were sinus rhythm with or without brief ectopy. There was significant artifact on many on the triggered events. There was one episode of ventricular trigeminy lasting approximately 10 seconds. No high risk findings.   Lab Results:  CBC    Component Value Date/Time   WBC 7.6 02/10/2019 1410   RBC 5.38 (H) 02/10/2019 1410   HGB 14.9 02/10/2019 1410   HGB 12.9 05/11/2016 1450   HGB 13.6 02/18/2015 1440   HCT 48.0 (H) 02/10/2019 1410    HCT 39.5 05/11/2016 1450   HCT 42.2 02/18/2015 1440   PLT 310 02/10/2019 1410   PLT 242 05/11/2016 1450   MCV 89.2 02/10/2019 1410   MCV 85 05/11/2016 1450   MCV 84.1 02/18/2015 1440   MCH 27.7 02/10/2019 1410   MCHC 31.0 02/10/2019 1410   RDW 13.4 02/10/2019 1410   RDW 15.0 05/11/2016 1450   RDW 14.3 02/18/2015 1440   LYMPHSABS 0.5 (L) 02/10/2019 1410   LYMPHSABS 1.5 02/18/2015 1440   MONOABS 0.6 02/10/2019 1410   MONOABS 0.4 02/18/2015 1440   EOSABS 0.1 02/10/2019 1410   EOSABS 0.1 02/18/2015 1440   BASOSABS 0.0 02/10/2019 1410   BASOSABS 0.0 02/18/2015 1440    BMET    Component Value Date/Time   NA 141 02/10/2019 1410   NA 144 05/11/2016 1450   NA 139 02/18/2015 1440   K 5.2 (H) 02/10/2019 1410   K 4.2 02/18/2015 1440   CL 104 02/10/2019 1410   CL 103 07/21/2012 1415   CO2 26 02/10/2019 1410   CO2 29 02/18/2015 1440   GLUCOSE 133 (H) 02/10/2019 1410   GLUCOSE 73 02/18/2015 1440   GLUCOSE 106 (H) 07/21/2012 1415   BUN 24 (H) 02/10/2019 1410   BUN 21 05/11/2016 1450   BUN 19.1 02/18/2015 1440   CREATININE 1.03 (H) 02/10/2019 1410   CREATININE 0.9 02/18/2015 1440   CALCIUM 9.8 02/10/2019 1410   CALCIUM 10.2 02/18/2015 1440   GFRNONAA 54 (L) 02/10/2019 1410   GFRAA >60 02/10/2019 1410    BNP    Component Value Date/Time   BNP 81.4 10/31/2018 2045    ProBNP    Component Value Date/Time   PROBNP <30.0 09/21/2009 0125    Specialty Problems      Pulmonary Problems   Obstructive sleep apnea    NPSG 2004:  AHI 10/hr Failed cpap trials.>dental appliance   Split night 06/2015 >Moderate obstructive sleep apnea occurred during this study  (AHI = 22.7/h).>rec CPAP >> poor compliance  HST 11/2016 AHI 12/h, 7h TST  03/2018 -office visit with Dr. Elsworth Soho discussed inspire device, patient would need to lose weight         Allergies  Allergen Reactions  . Geodon [Ziprasidone Hydrochloride] Other (See Comments)    Extremely agitated  . Lithium Nausea Only and  Other (See Comments)    Off balance, increased heart rate  . Talwin [Pentazocine] Other (See Comments)    Hallucinations   . Toradol [Ketorolac Tromethamine] Other (See Comments)    Chest pains  . Ziprasidone Other (See Comments)    Extremely agitated  . Abilify [Aripiprazole] Other (See Comments)    jerking  . Compazine [Prochlorperazine Edisylate] Nausea And Vomiting  . Latuda [Lurasidone Hcl] Other (See Comments)    Reports made her mind race more and irritable   . Pristiq [Desvenlafaxine Succinate Er] Other (See Comments)    Did not work, prefers  not to take  . Rexulti [Brexpiprazole] Swelling    Elevated BP, created aggression  . Diflucan [Fluconazole] Nausea And Vomiting    Immunization History  Administered Date(s) Administered  . Influenza Whole 12/21/2007, 03/22/2009  . Influenza, High Dose Seasonal PF 05/26/2018  . Influenza,inj,Quad PF,6+ Mos 01/20/2013  . Influenza-Unspecified 12/20/2013, 12/04/2015  . Pneumococcal Polysaccharide-23 04/21/2013  . Zoster 12/20/2012    Past Medical History:  Diagnosis Date  . Abscess of Bartholin's gland   . Acute vestibular neuronitis    Dr Lucia Gaskins  . ADHD (attention deficit hyperactivity disorder)   . Adverse effect of general anesthetic    felt paralyzed while receiving anesthesia  . Anxiety   . Bipolar 1 disorder ([place])   . CAD (coronary artery disease), native coronary artery    cath 05/2016 showing 50-70% stenosis in the mid LAD proximal to the first diagonal and 70-80% small OM1. FFR of LAD  not performed because of difficulty with catheter control from the right radial.   . Chronic kidney disease    kidney cancer- pt states she has elected to not have it treated.  . Depression    Bipolar disorder/goes to [facility] for meds  . Difficult intubation    told by [facility] that she was hard to intubate 15b yrs ago in [place]- surgery since then no problems  . GERD (gastroesophageal reflux disease)   . H/O echocardiogram 07/2012    [place] LVF w grade I siastolic dysfunction   . History of attention deficit disorder   . History of endometriosis   . History of pleural effusion   . Hyperlipidemia LDL goal <70 06/24/2016  . Hypertension   . 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 Memory difficulty 01/26/2017  . Migraines   . Obesity   . OSA (obstructive sleep apnea)    uses oral appliance instead of CPAP  . PAT (paroxysmal atrial tachycardia) ([facility])    s/p ablation  . PONV (postoperative nausea and vomiting)   . PTSD (post-traumatic stress disorder)   . PVC's (premature ventricular contractions)   . Renal mass 02/15/2012  . RLS (restless legs syndrome)    Dr Gwenette Greet  . rt renal ca dx'd 12/2009   no treatment/ no surg  . Vertigo     Tobacco History: Social History   Tobacco Use  Smoking Status Never Smoker  Smokeless Tobacco Never Used   Counseling given: Yes  Continue to not smoke  Outpatient Encounter Medications as of 04/12/2019  Medication Sig  . aspirin EC 81 MG tablet Take 81 mg by mouth daily.  Marland Kitchen b complex vitamins tablet Take 1 tablet by mouth daily.  Marland Kitchen BYSTOLIC 2.5 MG tablet TAKE 1 TABLET BY MOUTH EVERY DAY  . diphenhydrAMINE (BENADRYL) 25 mg capsule Take 25 mg by mouth every 6 (six) hours as needed for allergies.  Marland Kitchen divalproex (DEPAKOTE ER) 500 MG 24 hr tablet Take 1 tablet (500 mg total) by mouth daily.  . fluticasone (FLONASE) 50 MCG/ACT nasal spray Place 2 sprays into both nostrils daily.  Marland Kitchen ketoconazole (NIZORAL) 2 % cream Apply 1 application topically 2 (two) times daily.  Marland Kitchen levothyroxine (SYNTHROID, LEVOTHROID) 88 MCG tablet Take 1 tablet (88 mcg total) by mouth daily at 6 (six) AM. For hypothyroidism  . LORazepam (ATIVAN) 0.5 MG tablet Take 1 tablet (0.5 mg total) by mouth 3 (three) times daily as needed for anxiety.  Marland Kitchen losartan-hydrochlorothiazide (HYZAAR) 50-12.5 MG tablet TAKE 1 TABLET BY MOUTH DAILY.  PLEASE KEEP UPCOMING APPT IN NOVEMBER BEFORE ANYMORE  REFILLS  . magnesium gluconate (MAGONATE) 500 MG tablet Take 500 mg by mouth daily.  . Menthol, Topical Analgesic, (BLUE GEL EX) Apply 1 application topically every 6 (six) hours as needed (restless legs).  . methylphenidate (RITALIN) 5 MG tablet Take 1 tablet (5 mg total) by mouth 2 (two) times daily with breakfast and lunch.  . Omega-3 Fatty Acids (OMEGA 3 500 PO) Take 1,000 mg by mouth.   . ondansetron (ZOFRAN ODT) 4 MG disintegrating tablet Take 1 tablet (4 mg total) by mouth every 8 (eight) hours as needed for nausea or vomiting.  . ondansetron (ZOFRAN) 8 MG tablet Take 8 mg by mouth every 8 (eight) hours as needed for nausea or vomiting.   . Papaya Enzyme CHEW Chew 1 tablet by mouth 2 (two) times daily as needed (indigestion).  Marland Kitchen rOPINIRole (REQUIP) 0.5 MG tablet Can take 1 0.5mg  tablet with dinner, can take 2 0.5mg  tablets (total of 1mg ) 1- 2 hours before bedtime.  . rosuvastatin (CRESTOR) 5 MG tablet Take 1 tablet (5 mg total) by mouth daily.  . Vitamin D, Cholecalciferol, 50 MCG (2000 UT) CAPS Take 2,000 Units by mouth daily.  . [DISCONTINUED] rOPINIRole (REQUIP) 0.5 MG tablet TAKE 1 TABLET (0.5 MG TOTAL) BY MOUTH AT BEDTIME.   No facility-administered encounter medications on file as of 04/12/2019.    Review of Systems  Review of Systems  Constitutional: Positive for fatigue. Negative for activity change and fever.  HENT: Negative for sinus pressure, sinus pain and sore throat.   Respiratory: Negative for cough, shortness of breath and wheezing.   Cardiovascular: Negative for chest pain and palpitations.  Gastrointestinal: Negative for diarrhea, nausea and vomiting.  Musculoskeletal: Negative for arthralgias.  Neurological: Negative for dizziness.  Psychiatric/Behavioral: Positive for dysphoric mood and sleep disturbance. The patient is hyperactive. The patient is not nervous/anxious.      Physical Exam  Deferred as this is a televisit   Assessment & Plan:   Obstructive  sleep apnea Patient reporting today that she would be interested in trying CPAP therapy again  Discussion: Discussed with patient that she has been tested multiple times.  She continues to be noncompliant with CPAP therapy.  Unfortunately patient was also unable to tolerate a dental appliance.  Reviewed with patient again that probably her best option would be the inspire device.  She would need to work on losing weight for goal BMI of 35 prior to referral to ENT  Plan: Continue to work on losing weight Increase your physical activity Could consider inspire device referral to ENT if BMI drops below 35 6-week follow-up with Dr. Elsworth Soho in person  [place] Patient reporting worsening restless leg syndrome Patient already has increased her dose to 0.5 mg with dinner and 0.5 mg prior to bedtime  Discussion: Discussed case with Dr. Ander Slade, he reportedly can increase dose to a total of 1.5 mg prior to bedtime.  I discussed this with the patient  Plan: Increase Requip to 0.5 mg with dinner, and two 0.5mg  tablets 1 to 2 hours before bedtime Schedule close follow-up with Dr. Elsworth Soho in person to further evaluate and see if this doses tolerable as well as working   Follow Up Instructions:  Return in about 4 weeks (around 05/10/2019), or if symptoms worsen or fail to improve, for Follow up with Dr. Elsworth Soho.   I discussed the assessment and treatment plan with the patient. The patient was provided an opportunity  to ask questions and all were answered. The patient agreed with the plan and demonstrated an understanding of the instructions.   The patient was advised to call back or seek an in-person evaluation if the symptoms worsen or if the condition fails to improve as anticipated.  I provided 28 minutes of non-face-to-face time during this encounter.   Lauraine Rinne, NP

## 2019-04-12 ENCOUNTER — Ambulatory Visit (INDEPENDENT_AMBULATORY_CARE_PROVIDER_SITE_OTHER): Payer: Medicare Other | Admitting: Pulmonary Disease

## 2019-04-12 ENCOUNTER — Encounter: Payer: Self-pay | Admitting: Pulmonary Disease

## 2019-04-12 ENCOUNTER — Other Ambulatory Visit: Payer: Self-pay

## 2019-04-12 DIAGNOSIS — G2581 Restless legs syndrome: Secondary | ICD-10-CM | POA: Diagnosis not present

## 2019-04-12 DIAGNOSIS — G4733 Obstructive sleep apnea (adult) (pediatric): Secondary | ICD-10-CM

## 2019-04-12 MED ORDER — ROPINIROLE HCL 0.5 MG PO TABS: ORAL_TABLET | ORAL | 0 refills | Status: DC

## 2019-04-12 NOTE — Assessment & Plan Note (Signed)
Patient reporting today that she would be interested in trying CPAP therapy again  Discussion: Discussed with patient that she has been tested multiple times.  She continues to be noncompliant with CPAP therapy.  Unfortunately patient was also unable to tolerate a dental appliance.  Reviewed with patient again that probably her best option would be the inspire device.  She would need to work on losing weight for goal BMI of 35 prior to referral to ENT  Plan: Continue to work on losing weight Increase your physical activity Could consider inspire device referral to ENT if BMI drops below 35 6-week follow-up with Dr. Elsworth Soho in person

## 2019-04-12 NOTE — Assessment & Plan Note (Signed)
Patient reporting worsening restless leg syndrome Patient already has increased her dose to 0.5 mg with dinner and 0.5 mg prior to bedtime  Discussion: Discussed case with Dr. Ander Slade, he reportedly can increase dose to a total of 1.5 mg prior to bedtime.  I discussed this with the patient  Plan: Increase Requip to 0.5 mg with dinner, and two 0.5mg  tablets 1 to 2 hours before bedtime Schedule close follow-up with Dr. Elsworth Soho in person to further evaluate and see if this doses tolerable as well as working

## 2019-04-12 NOTE — Patient Instructions (Signed)
You were seen today by Lauraine Rinne, NP  for:   1. Obstructive sleep apnea  Continue to work on losing weight  You may be a candidate for the inspire device if your BMI drops below 35  2. RESTLESS LEGS SYNDROME  - rOPINIRole (REQUIP) 0.5 MG tablet; Can take 1 0.5mg  tablet with dinner, can take 2 0.5mg  tablets (total of 1mg ) 1- 2 hours before bedtime.  Dispense: 90 tablet; Refill: 0   We recommend today:   Meds ordered this encounter  Medications  . rOPINIRole (REQUIP) 0.5 MG tablet    Sig: Can take 1 0.5mg  tablet with dinner, can take 2 0.5mg  tablets (total of 1mg ) 1- 2 hours before bedtime.    Dispense:  90 tablet    Refill:  0    Follow Up:    Return in about 4 weeks (around 05/10/2019), or if symptoms worsen or fail to improve, for Follow up with Dr. Elsworth Soho.   Please do your part to reduce the spread of COVID-19:      Reduce your risk of any infection  and COVID19 by using the similar precautions used for avoiding the common cold or flu:  Marland Kitchen Wash your hands often with soap and warm water for at least 20 seconds.  If soap and water are not readily available, use an alcohol-based hand sanitizer with at least 60% alcohol.  . If coughing or sneezing, cover your mouth and nose by coughing or sneezing into the elbow areas of your shirt or coat, into a tissue or into your sleeve (not your hands). Langley Gauss A MASK when in public  . Avoid shaking hands with others and consider head nods or verbal greetings only. . Avoid touching your eyes, nose, or mouth with unwashed hands.  . Avoid close contact with people who are sick. . Avoid places or events with large numbers of people in one location, like concerts or sporting events. . If you have some symptoms but not all symptoms, continue to monitor at home and seek medical attention if your symptoms worsen. . If you are having a medical emergency, call 911.   [place] / e-Visit:  [email or web address]         [facility]: [facility]: [id number]                   [facility]: [id number]     It is flu season:   >>> Best ways to protect herself from the flu: Receive the yearly flu vaccine, practice good hand hygiene washing with soap and also using hand sanitizer when available, eat a nutritious meals, get adequate rest, hydrate appropriately   Please contact the office if your symptoms worsen or you have concerns that you are not improving.   Thank you for choosing [facility] for your healthcare, and for allowing [place] to partner with you on your healthcare journey. I am thankful to be able to provide care to you today.   Wyn Quaker FNP-C

## 2019-04-20 ENCOUNTER — Other Ambulatory Visit: Payer: Self-pay | Admitting: Physician Assistant

## 2019-04-21 ENCOUNTER — Other Ambulatory Visit: Payer: Self-pay | Admitting: Physician Assistant

## 2019-04-21 DIAGNOSIS — Z1211 Encounter for screening for malignant neoplasm of colon: Secondary | ICD-10-CM

## 2019-04-26 ENCOUNTER — Ambulatory Visit (INDEPENDENT_AMBULATORY_CARE_PROVIDER_SITE_OTHER): Payer: Medicare Other | Admitting: Psychiatry

## 2019-04-26 ENCOUNTER — Other Ambulatory Visit: Payer: Self-pay

## 2019-04-26 DIAGNOSIS — F3130 Bipolar disorder, current episode depressed, mild or moderate severity, unspecified: Secondary | ICD-10-CM | POA: Diagnosis not present

## 2019-04-26 DIAGNOSIS — F411 Generalized anxiety disorder: Secondary | ICD-10-CM | POA: Diagnosis not present

## 2019-04-26 DIAGNOSIS — F4310 Post-traumatic stress disorder, unspecified: Secondary | ICD-10-CM | POA: Diagnosis not present

## 2019-04-26 DIAGNOSIS — F900 Attention-deficit hyperactivity disorder, predominantly inattentive type: Secondary | ICD-10-CM

## 2019-04-26 MED ORDER — METHYLPHENIDATE HCL 5 MG PO TABS: 5.0000 mg | ORAL_TABLET | Freq: Two times a day (BID) | ORAL | 0 refills | Status: DC

## 2019-04-26 MED ORDER — DIVALPROEX SODIUM ER 500 MG PO TB24: 500.0000 mg | ORAL_TABLET | Freq: Every day | ORAL | 0 refills | Status: DC

## 2019-04-26 MED ORDER — LORAZEPAM 0.5 MG PO TABS: 0.5000 mg | ORAL_TABLET | Freq: Three times a day (TID) | ORAL | 1 refills | Status: DC | PRN

## 2019-04-26 MED ORDER — VORTIOXETINE HBR 10 MG PO TABS: 10.0000 mg | ORAL_TABLET | Freq: Every day | ORAL | 1 refills | Status: DC

## 2019-04-26 NOTE — Progress Notes (Signed)
Virtual Visit via Telephone Note  I connected with [name] on 04/26/19 at  1:00 PM EST by telephone and verified that I am speaking with the correct person using two identifiers.  Location: Patient: home Provider: office   I discussed the limitations, risks, security and privacy concerns of performing an evaluation and management service by telephone and the availability of in person appointments. I also discussed with the patient that there may be a patient responsible charge related to this service. The patient expressed understanding and agreed to proceed.   History of Present Illness: Just about the same. No better, no worse. The depression is not getting better.  She is using light box therapy. She has no appetite and she is not cooking. She notes her clothes are a little loser. She has some motivation and energy with Ritalin. She is given up the idea that she needs to move right away. [name] is trying to find ways to calm herself by listening to music and rocking in her rocking chair. [name] is doing the laundry and makes her bed. She is feeling less irritable. She is walking outside due to the cold. She is walking in the house daily. Her knee pain is ongoing but she is going to see an orthopedist soon. She was sick with HA, body aches, fatigue, nausea from Sunday to yesterday. She has feeling better today and has an appointment with her PCP tomorrow. Last weekend she went to the [place] for 3 days. She went out to lunch, pool, read and watched movies. She napped alot. [name] liked being alone and it was quiet. Her anxiety was low. It was good to get away from her ex. He is very depressed and it hurts her to see him. He is longer supportive in any way. She wants to try a new medication. The Depakote does not seem to be helping. She is taking Ativan 3x/day most days. Some days she is able to use coping skills and that helps. She is going to therapy but wants to find someone new. [name]  denies manic and hypomanic like symptoms. She denies SI/HI. The PTSD symptoms are unchanged.    Observations/Objective:  General Appearance: unable to assess  Eye Contact:  unable to assess  Speech:  Clear and Coherent and Normal Rate  Volume:  Normal  Mood:  Anxious and Depressed  Affect:  Congruent  Thought Process:  Coherent and Descriptions of Associations: Circumstantial  Orientation:  Full (Time, Place, and Person)  Thought Content:  Rumination  Suicidal Thoughts:  No  Homicidal Thoughts:  No  Memory:  Immediate;   Good  Judgement:  Fair  Insight:  Fair  Psychomotor Activity:  unable to assess  Concentration:  Concentration: Good  Recall:  Good  Fund of Knowledge:  Good  Language:  Good  Akathisia:  unable to assess  Handed:  Right  AIMS (if indicated):     Assets:  Communication Skills Desire for Improvement Financial Resources/Insurance  ADL's:  Unable to assess  Cognition:  WNL  Sleep:        Assessment and Plan:  start Trintellix   Bipolar I disorder, most recent episode depressed ([place]) - Plan: divalproex (DEPAKOTE ER) 500 MG 24 hr tablet, LORazepam (ATIVAN) 0.5 MG tablet, methylphenidate (RITALIN) 5 MG tablet, methylphenidate (RITALIN) 5 MG tablet, vortioxetine HBr (TRINTELLIX) 10 MG TABS tablet  PTSD (post-traumatic stress disorder) - Plan: LORazepam (ATIVAN) 0.5 MG tablet  GAD (generalized anxiety disorder) - Plan: LORazepam (ATIVAN) 0.5 MG  tablet  Attention deficit hyperactivity disorder (ADHD), predominantly inattentive type - Plan: methylphenidate (RITALIN) 5 MG tablet    Follow Up Instructions: In 6-8 weeks or sooner if needed   I discussed the assessment and treatment plan with the patient. The patient was provided an opportunity to ask questions and all were answered. The patient agreed with the plan and demonstrated an understanding of the instructions.   The patient was advised to call back or seek an in-person evaluation if the symptoms  worsen or if the condition fails to improve as anticipated.  I provided 30 minutes of non-face-to-face time during this encounter.   [name], MD

## 2019-04-30 ENCOUNTER — Telehealth: Payer: Self-pay

## 2019-04-30 NOTE — Telephone Encounter (Signed)
Attempted to contact pt to reschedule appointment for 2/22 or 2/23 due to provider's meeting. Left message to call back.

## 2019-05-04 NOTE — Telephone Encounter (Signed)
Attempted to contact pt x 2 to reschedule appointment. Left message to call back.

## 2019-05-08 NOTE — Telephone Encounter (Signed)
Per chart review, pt cancelled appointment.

## 2019-05-16 ENCOUNTER — Ambulatory Visit: Payer: Medicare Other | Admitting: Pulmonary Disease

## 2019-05-18 ENCOUNTER — Ambulatory Visit: Payer: Medicare Other | Admitting: Cardiology

## 2019-05-19 ENCOUNTER — Other Ambulatory Visit: Payer: Self-pay | Admitting: Pulmonary Disease

## 2019-05-19 DIAGNOSIS — G2581 Restless legs syndrome: Secondary | ICD-10-CM

## 2019-05-23 ENCOUNTER — Encounter: Payer: Self-pay | Admitting: Cardiology

## 2019-05-23 ENCOUNTER — Other Ambulatory Visit: Payer: Self-pay

## 2019-05-23 ENCOUNTER — Ambulatory Visit (INDEPENDENT_AMBULATORY_CARE_PROVIDER_SITE_OTHER): Payer: Medicare Other | Admitting: Cardiology

## 2019-05-23 VITALS — BP 140/70 | HR 65 | Temp 97.9°F | Ht 64.0 in | Wt 212.2 lb

## 2019-05-23 DIAGNOSIS — R5382 Chronic fatigue, unspecified: Secondary | ICD-10-CM | POA: Diagnosis not present

## 2019-05-23 DIAGNOSIS — I2510 Atherosclerotic heart disease of native coronary artery without angina pectoris: Secondary | ICD-10-CM

## 2019-05-23 DIAGNOSIS — I471 Supraventricular tachycardia: Secondary | ICD-10-CM | POA: Diagnosis not present

## 2019-05-23 DIAGNOSIS — R6889 Other general symptoms and signs: Secondary | ICD-10-CM

## 2019-05-23 DIAGNOSIS — I10 Essential (primary) hypertension: Secondary | ICD-10-CM

## 2019-05-23 DIAGNOSIS — E7800 Pure hypercholesterolemia, unspecified: Secondary | ICD-10-CM

## 2019-05-23 NOTE — Patient Instructions (Addendum)
Medication Instructions:  Taper off the bystolic. Take 2.5 mg every other day for two doses, then every third day for two doses, then stop.  Continue to check home blood pressures. If consistently > 140/90, please call and we will adjust medications.  *If you need a refill on your cardiac medications before your next appointment, please call your pharmacy*   Lab Work: None   Testing/Procedures: None   Follow-Up: At [facility], you and your health needs are our priority.  As part of our continuing mission to provide you with exceptional heart care, we have created designated Provider Care Teams.  These Care Teams include your primary Cardiologist (physician) and Advanced Practice Providers (APPs -  Physician Assistants and Nurse Practitioners) who all work together to provide you with the care you need, when you need it.  We recommend signing up for the patient portal called "MyChart".  Sign up information is provided on this After Visit Summary.  MyChart is used to connect with patients for Virtual Visits (Telemedicine).  Patients are able to view lab/test results, encounter notes, upcoming appointments, etc.  Non-urgent messages can be sent to your provider as well.   To learn more about what you can do with MyChart, go to [email or web address].    Your next appointment:   3 month(s)  The format for your next appointment:   In Person  Provider:   Buford Dresser, MD

## 2019-05-23 NOTE — Progress Notes (Signed)
Cardiology Office Note:    Date:  05/23/2019   ID:  [name], DOB 1945-06-14, MRN [id number]  PCP:  Leighton Ruff, MD  Cardiologist:  Buford Dresser, MD (prior Dr. Radford Pax)  Referring MD: Leighton Ruff, MD   CC: follow up  History of Present Illness:    [name] is a 74 y.o. female with a hx of PVCs, paroxysmal atrial tachycardia s/p ablation, hypertension, CAD who is seen for follow up today. I initially met her 02/13/19 as a new patient to me/prior patient of Dr. Radford Pax for the evaluation and management of her above issues.  Today: Remains fatigue, asking if she can trial off the bystolic. We discussed tapering and trialing off this. Her BP has been 135/70 at home. Discussed that if this rises we may need to adjust other medications. Also discussed monitoring for symptoms of recurrent atrial tachycardia. Sleeps more than 12 hours/day, sometimes also during the day. She feels this is multifactorial, dealing with depression/anxiety as well.   Trying to walk, but struggling to do this every day. It helps her depression significantly. Looking forward to the spring. Hasn't timed/measured how far she can walk. Walks to [employer] and back. [place] can do two loops now.   She is concerned about her history of renal cell carcinoma, reports this is >25 years old. Has pending follow up with her urologist soon. This causes her stress/anxiety.   Does get short of breath with exertion, but no chest pain. No LE edema, PND, or orthopnea. No syncope.  Past Medical History:  Diagnosis Date  . Abscess of Bartholin's gland   . Acute vestibular neuronitis    Dr Lucia Gaskins  . ADHD (attention deficit hyperactivity disorder)   . Adverse effect of general anesthetic    felt paralyzed while receiving anesthesia  . Anxiety   . Bipolar 1 disorder ([place])   . CAD (coronary artery disease), native coronary artery    cath 05/2016 showing 50-70% stenosis in the  mid LAD proximal to the first diagonal and 70-80% small OM1. FFR of LAD  not performed because of difficulty with catheter control from the right radial.   . Chronic kidney disease    kidney cancer- pt states she has elected to not have it treated.  . Depression    Bipolar disorder/goes to [facility] for meds  . Difficult intubation    told by [facility] that she was hard to intubate 15b yrs ago in [place]- surgery since then no problems  . GERD (gastroesophageal reflux disease)   . H/O echocardiogram 07/2012   [place] LVF w grade I siastolic dysfunction   . History of attention deficit disorder   . History of endometriosis   . History of pleural effusion   . Hyperlipidemia LDL goal <70 06/24/2016  . Hypertension   . 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 Memory difficulty 01/26/2017  . Migraines   . Obesity   . OSA (obstructive sleep apnea)    uses oral appliance instead of CPAP  . PAT (paroxysmal atrial tachycardia) (H[facility])    s/p ablation  . PONV (postoperative nausea and vomiting)   . PTSD (post-traumatic stress disorder)   . PVC's (premature ventricular contractions)   . Renal mass 02/15/2012  . RLS (restless legs syndrome)    Dr Gwenette Greet  . rt renal ca dx'd 12/2009   no treatment/ no surg  . Vertigo     Past Surgical  History:  Procedure Laterality Date  . CARDIAC ELECTROPHYSIOLOGY [place] AND ABLATION  2000s  . LEFT HEART CATH AND CORONARY ANGIOGRAPHY N/A 06/08/2016   Procedure: Left Heart Cath and Coronary Angiography;  Surgeon: Belva Crome, MD;  Location: [facility];  Service: Cardiovascular;  Laterality: N/A;  . nasoseptal reconstruction  1990s  . THYROIDECTOMY  1990  . TONSILLECTOMY     as a child  . TUBAL LIGATION  1970s  . uterine mass removal  03/2012   was found to be benign    Current Medications: Current Outpatient Medications on File Prior to Visit  Medication Sig  . aspirin EC 81 MG tablet Take 81 mg by mouth daily.  Marland Kitchen b  complex vitamins tablet Take 1 tablet by mouth daily.  Marland Kitchen BYSTOLIC 2.5 MG tablet TAKE 1 TABLET BY MOUTH EVERY DAY  . diphenhydrAMINE (BENADRYL) 25 mg capsule Take 25 mg by mouth every 6 (six) hours as needed for allergies.  Marland Kitchen divalproex (DEPAKOTE ER) 500 MG 24 hr tablet Take 1 tablet (500 mg total) by mouth daily.  . fluticasone (FLONASE) 50 MCG/ACT nasal spray Place 2 sprays into both nostrils daily.  Marland Kitchen ketoconazole (NIZORAL) 2 % cream Apply 1 application topically 2 (two) times daily.  Marland Kitchen levothyroxine (SYNTHROID, LEVOTHROID) 88 MCG tablet Take 1 tablet (88 mcg total) by mouth daily at 6 (six) AM. For hypothyroidism  . LORazepam (ATIVAN) 0.5 MG tablet Take 1 tablet (0.5 mg total) by mouth 3 (three) times daily as needed for anxiety.  Marland Kitchen losartan-hydrochlorothiazide (HYZAAR) 50-12.5 MG tablet TAKE 1 TABLET BY MOUTH DAILY. PLEASE KEEP UPCOMING APPT IN NOVEMBER BEFORE ANYMORE REFILLS  . magnesium gluconate (MAGONATE) 500 MG tablet Take 500 mg by mouth daily.  . Menthol, Topical Analgesic, (BLUE GEL EX) Apply 1 application topically every 6 (six) hours as needed (restless legs).  . methylphenidate (RITALIN) 5 MG tablet Take 1 tablet (5 mg total) by mouth 2 (two) times daily with breakfast and lunch.  . Omega-3 Fatty Acids (OMEGA 3 500 PO) Take 1,000 mg by mouth.   . ondansetron (ZOFRAN) 8 MG tablet Take 8 mg by mouth every 8 (eight) hours as needed for nausea or vomiting.   Marland Kitchen rOPINIRole (REQUIP) 0.5 MG tablet CAN TAKE 1 TABLET WITH DINNER, CAN TAKE 2 TABLETS 1- 2 HOURS BEFORE BEDTIME.  . rosuvastatin (CRESTOR) 5 MG tablet Take 1 tablet (5 mg total) by mouth daily.  . Vitamin D, Cholecalciferol, 50 MCG (2000 UT) CAPS Take 2,000 Units by mouth daily.   No current facility-administered medications on file prior to visit.     Allergies:   Geodon [ziprasidone hydrochloride], Lithium, Talwin [pentazocine], Toradol [ketorolac tromethamine], Ziprasidone, Abilify [aripiprazole], Compazine [prochlorperazine  edisylate], Latuda [lurasidone hcl], Pristiq [desvenlafaxine succinate er], Rexulti [brexpiprazole], and Diflucan [fluconazole]   Social History   Tobacco Use  . Smoking status: Never Smoker  . Smokeless tobacco: Never Used  Substance Use Topics  . Alcohol use: Not Currently    Alcohol/week: 1.0 standard drinks    Types: 1 Glasses of wine per week  . Drug use: No    Family History: family history includes Alcohol abuse in her father; Allergies in her brother, daughter, father, and sister; Bipolar disorder in her father; CAD in her mother; Cancer in her paternal grandfather; Colon cancer in her paternal grandmother; Depression in her mother; Heart disease in her maternal grandfather and mother; Hypertension in her mother; OCD in her paternal grandmother; Skin cancer in her father.  ROS:  Please see the history of present illness.  Additional pertinent ROS unremarkable except as documented.  EKGs/Labs/Other Studies Reviewed:    The following studies were reviewed today: Cath 06/08/2016  50-70% stenosis in the mid LAD proximal to the first diagonal. FFR not performed because of difficulty with catheter control from the right radial.   75-80% stenosis in the midsegment of the first obtuse marginal.   Luminal irregularities in the mid RCA.   Overall normal LV function with EF percent.   Recommendations:    Aggressive risk factor modification and anti-ischemic therapy ( beta blocker, statin therapy and possibly LA nitrates) in this non-ACS subset. PCI in this setting does not meet appropriate use criteria.   If limiting symptoms on medications or convincing evidence of ischemia, consider obtuse marginal PCI and/or higher than usual risk PCI on the LAD (due to calcification and angulation).   Lexiscan 09/19/2014  Nuclear stress EF: 70%.  Horizontal ST segment depression ST segment depression of 1 mm was noted during stress in the V5, V6, aVF, II, III and I leads, and returning  to baseline after 5-9 minutes of recovery.  The study is normal.  This is a low risk study.  The left ventricular ejection fraction is hyperdynamic (>65%).   Low risk stress nuclear study with normal perfusion and normal left ventricular regional and global systolic function. Abnormal ECG response may represent a "false positive".  Echo 04/22/2013 - Left ventricle: The cavity size was normal. Systolic  function was normal. The estimated ejection fraction was  in the range of 55% to 60%. Wall motion was normal; there  were no regional wall motion abnormalities. Left  ventricular diastolic function parameters were normal.  - Pulmonary arteries: PA peak pressure: 75m Hg (S).   EKG:  EKG is personally reviewed.  The ekg ordered today demonstrates NSR  Recent Labs: 05/26/2018: TSH 1.867 10/31/2018: B Natriuretic Peptide 81.4 02/10/2019: ALT 12; BUN 24; Creatinine, Ser 1.03; Hemoglobin 14.9; Platelets 310; Potassium 5.2; Sodium 141  Recent Lipid Panel    Component Value Date/Time   CHOL 177 05/26/2018 0625   TRIG 68 05/26/2018 0625   HDL 47 05/26/2018 0625   CHOLHDL 3.8 05/26/2018 0625   VLDL 14 05/26/2018 0625   LDLCALC 116 (H) 05/26/2018 0625    Physical Exam:    VS:  BP 140/70   Pulse 65   Temp 97.9 F (36.6 C)   Ht 5' 4" (1.626 m)   Wt 212 lb 3.2 oz (96.3 kg)   SpO2 99%   BMI 36.42 kg/m     Wt Readings from Last 3 Encounters:  05/23/19 212 lb 3.2 oz (96.3 kg)  03/21/19 213 lb (96.6 kg)  02/13/19 214 lb (97.1 kg)    GEN: Well nourished, well developed in no acute distress HEENT: Normal, moist mucous membranes NECK: No JVD CARDIAC: regular rhythm, normal S1 and S2, no rubs or gallops. No murmur. VASCULAR: Radial and DP pulses 2+ bilaterally. No carotid bruits RESPIRATORY:  Clear to auscultation without rales, wheezing or rhonchi  ABDOMEN: Soft, non-tender, non-distended MUSCULOSKELETAL:  Ambulates independently SKIN: Warm and dry, no edema NEUROLOGIC:   Alert and oriented x 3. No focal neuro deficits noted. PSYCHIATRIC:  Normal affect    ASSESSMENT:    1. Chronic fatigue   2. [place]   3. Essential hypertension   4. Coronary artery disease involving native coronary artery of native heart without angina pectoris   5. Exercise intolerance   6.  Pure hypercholesterolemia    PLAN:    Fatigue: we discussed multiple possible causes for this. She would like to try to come off the bystolic to see if she has more energy -I did discuss with her that this may worsens palpitations/paroxysmal atrial tachycardia. If that is the case, would restart bystolic -we also discussed that this may raise her blood pressure. Given instructions to monitor. -given the following instructions:  Taper off the bystolic. Take 2.5 mg every other day for two doses, then every third day for two doses, then stop.  Continue to check home blood pressures. If consistently > 140/90, please call and we will adjust medications.  Coronary disease by cath 2018, exercise intolerance -based on cath in 2018, would aim for medical management as much as tolerated. If not improved on optimal medical management, then consider cath/PCI. -continue aspirin 81 mg daily -statin as below -beta blocker as above  Hypercholesterolemia: LDL goal <70, last was 116 on 05/26/18 -tolerating 5 mg rosuvastatin. Has a history of prior intolerances -check lipids/lfts at follow up  Hypertension:  -continue losartan-HCTZ 50-12.5 mg daily -tapering bystolic as above per patient request  Cardiac risk counseling and prevention recommendations: -recommend heart healthy/Mediterranean diet, with whole grains, fruits, vegetable, fish, lean meats, nuts, and olive oil. Limit salt. -recommend moderate walking, 3-5 times/week for 30-50 minutes each session. Aim for at least 150 minutes.week. Goal should be pace of 3 miles/hours, or walking 1.5 miles in 30 minutes -recommend  avoidance of tobacco products. Avoid excess alcohol.  Plan for follow up: 3 mos  Buford Dresser, MD, PhD [place]  [facility]   Medication Adjustments/Labs and Tests Ordered: Current medicines are reviewed at length with the patient today.  Concerns regarding medicines are outlined above.  Orders Placed This Encounter  Procedures  . EKG 12-Lead   No orders of the defined types were placed in this encounter.   Patient Instructions  Medication Instructions:  Taper off the bystolic. Take 2.5 mg every other day for two doses, then every third day for two doses, then stop.  Continue to check home blood pressures. If consistently > 140/90, please call and we will adjust medications.  *If you need a refill on your cardiac medications before your next appointment, please call your pharmacy*   Lab Work: None   Testing/Procedures: None   Follow-Up: At [facility], you and your health needs are our priority.  As part of our continuing mission to provide you with exceptional heart care, we have created designated Provider Care Teams.  These Care Teams include your primary Cardiologist (physician) and Advanced Practice Providers (APPs -  Physician Assistants and Nurse Practitioners) who all work together to provide you with the care you need, when you need it.  We recommend signing up for the patient portal called "MyChart".  Sign up information is provided on this After Visit Summary.  MyChart is used to connect with patients for Virtual Visits (Telemedicine).  Patients are able to view lab/test results, encounter notes, upcoming appointments, etc.  Non-urgent messages can be sent to your provider as well.   To learn more about what you can do with MyChart, go to [email or web address].    Your next appointment:   3 month(s)  The format for your next appointment:   In Person  Provider:   Buford Dresser, MD     Signed, Buford Dresser, MD  PhD 05/23/2019  [place]

## 2019-05-24 DIAGNOSIS — M7551 Bursitis of right shoulder: Secondary | ICD-10-CM

## 2019-05-24 HISTORY — DX: Bursitis of right shoulder: M75.51

## 2019-05-27 ENCOUNTER — Encounter: Payer: Self-pay | Admitting: Cardiology

## 2019-05-28 ENCOUNTER — Ambulatory Visit: Payer: Medicare Other | Admitting: Pulmonary Disease

## 2019-06-07 ENCOUNTER — Telehealth: Payer: Self-pay | Admitting: Cardiology

## 2019-06-07 MED ORDER — LOSARTAN POTASSIUM-HCTZ 50-12.5 MG PO TABS: ORAL_TABLET | ORAL | 3 refills | Status: DC

## 2019-06-07 NOTE — Telephone Encounter (Signed)
Spoke to patient losartan/hctz refill sent to pharmacy.

## 2019-06-07 NOTE — Telephone Encounter (Signed)
New message   Patient needs a new prescription for losartan-hydrochlorothiazide (HYZAAR) 50-12.5 MG tablet sent to [id number] - [place], [address]   Patient is out of medication.

## 2019-06-08 ENCOUNTER — Other Ambulatory Visit: Payer: Self-pay

## 2019-06-11 ENCOUNTER — Ambulatory Visit (INDEPENDENT_AMBULATORY_CARE_PROVIDER_SITE_OTHER): Payer: Medicare Other | Admitting: Pulmonary Disease

## 2019-06-11 ENCOUNTER — Encounter: Payer: Self-pay | Admitting: Pulmonary Disease

## 2019-06-11 ENCOUNTER — Other Ambulatory Visit: Payer: Self-pay

## 2019-06-11 VITALS — BP 122/70 | HR 79 | Temp 98.0°F | Ht 64.57 in | Wt 210.2 lb

## 2019-06-11 DIAGNOSIS — G4733 Obstructive sleep apnea (adult) (pediatric): Secondary | ICD-10-CM

## 2019-06-11 DIAGNOSIS — G2581 Restless legs syndrome: Secondary | ICD-10-CM

## 2019-06-11 NOTE — Assessment & Plan Note (Signed)
She has not tolerated CPAP or dental appliance in the past. Answered all questions about inspire device, she would need to be at a BMI of 32 or lower for this.  Weight loss of 10 to 20 pounds would be advised

## 2019-06-11 NOTE — Patient Instructions (Signed)
Check blood work for iron levels. Stay on current doses of Requip Weight loss 10 to 20 pounds advised

## 2019-06-11 NOTE — Assessment & Plan Note (Signed)
Check blood work for iron levels. Stay on current doses of Requip -1.5 mg/day, 1 tablet around dinnertime and 2 tablets at bedtime No evidence of augmentation

## 2019-06-11 NOTE — Progress Notes (Signed)
   Subjective:    Patient ID: [name], female    DOB: 1945-10-23, 74 y.o.   MRN: [id number]  HPI  74 yo yowoman for FU of restless leg syndrome andOSA  She has bipolar disorder and anxiety -on antidepressants for more than 30 years, now off 11/2016AHI was worse compared to 2004, she was started on CPAP of 9 cm but had poor compliance and this was discontinued by DME  She underwent repeat home sleep study 11/2016 which showed AHI of 12/hour andwas started back on CPAP. She has usedDental appliance in the past but did not tolerate this well  Shewastapered off Prozac and her restless leg symptoms improved tremendously  Chief Complaint  Patient presents with  . Follow-up    Patient was instructed to increase requip at last visit thats a total of 3 tablets. Patient still has restless legs even when she takes nap during the day.     Televisit 03/2019 and Requip was increased from 1.0 mg to 1.5 mg .  She does not need 2 tablets at bedtime always, just as needed.  She finds that applying a pain gel to her legs works wonders Sometimes a creepy crawly sensations goes as high as her hips  She is now off all antidepressants and is considering transcranial stimulation, still sees her therapist regularly and talk therapy seems to help more than antidepressants did.  Tearful during the interview. She has questions about the inspire device for OSA   Significant tests/ events reviewed NPSG 2004: AHI 10/hr PSG 07/2015 (214 lbs) AHI 23/h, mild PLMs 23/h but no sig arousals  CPAP titration 09/2015 9 cm  HST 11/2016 AHI 12/h, 7h TST  10/2018Spirometry-poor effort ,ratio 68, FEV1 of 67% and FVC of 75% suggesting mild obstruction  Review of Systems Patient denies significant dyspnea,cough, hemoptysis,  chest pain, palpitations, pedal edema, orthopnea, paroxysmal nocturnal dyspnea, lightheadedness, nausea, vomiting, abdominal or  leg pains      Objective:   Physical  Exam  Gen. Pleasant, obese, in no distress ENT - no lesions, no post nasal drip, slight overbite Neck: No JVD, no thyromegaly, no carotid bruits Lungs: no use of accessory muscles, no dullness to percussion, decreased without rales or rhonchi  Cardiovascular: Rhythm regular, heart sounds  normal, no murmurs or gallops, no peripheral edema Musculoskeletal: No deformities, no cyanosis or clubbing , no tremors       Assessment & Plan:

## 2019-06-12 LAB — IRON, TOTAL/TOTAL IRON BINDING CAP
%SAT: 12 % (calc) — ABNORMAL LOW (ref 16–45)
Iron: 39 ug/dL — ABNORMAL LOW (ref 45–160)
TIBC: 327 mcg/dL (calc) (ref 250–450)

## 2019-06-12 LAB — FERRITIN: Ferritin: 114.4 ng/mL (ref 10.0–291.0)

## 2019-06-14 ENCOUNTER — Other Ambulatory Visit: Payer: Self-pay

## 2019-06-14 ENCOUNTER — Encounter (HOSPITAL_COMMUNITY): Payer: Self-pay | Admitting: Psychiatry

## 2019-06-14 ENCOUNTER — Ambulatory Visit (INDEPENDENT_AMBULATORY_CARE_PROVIDER_SITE_OTHER): Payer: Medicare Other | Admitting: Psychiatry

## 2019-06-14 ENCOUNTER — Telehealth: Payer: Self-pay | Admitting: Pulmonary Disease

## 2019-06-14 DIAGNOSIS — F4310 Post-traumatic stress disorder, unspecified: Secondary | ICD-10-CM | POA: Diagnosis not present

## 2019-06-14 DIAGNOSIS — F411 Generalized anxiety disorder: Secondary | ICD-10-CM

## 2019-06-14 DIAGNOSIS — F3130 Bipolar disorder, current episode depressed, mild or moderate severity, unspecified: Secondary | ICD-10-CM | POA: Diagnosis not present

## 2019-06-14 MED ORDER — METHYLPHENIDATE HCL 5 MG PO TABS: 5.0000 mg | ORAL_TABLET | Freq: Two times a day (BID) | ORAL | 0 refills | Status: DC

## 2019-06-14 MED ORDER — PAROXETINE HCL 10 MG PO TABS: 10.0000 mg | ORAL_TABLET | Freq: Every day | ORAL | 1 refills | Status: DC

## 2019-06-14 MED ORDER — LORAZEPAM 0.5 MG PO TABS: 0.5000 mg | ORAL_TABLET | Freq: Three times a day (TID) | ORAL | 1 refills | Status: DC | PRN

## 2019-06-14 NOTE — Progress Notes (Signed)
Virtual Visit via Telephone Note  I connected with [name] on 06/14/19 at 11:00 AM EDT by telephone and verified that I am speaking with the correct person using two identifiers.  Location: Patient: home Provider: office   I discussed the limitations, risks, security and privacy concerns of performing an evaluation and management service by telephone and the availability of in person appointments. I also discussed with the patient that there may be a patient responsible charge related to this service. The patient expressed understanding and agreed to proceed.   History of Present Illness: [name] is a little better. The depression is always hanging over head. She feels has a tiny amount of motivation in some areas. The Ritalin is helping and she is noticing that she is not spending as much time in bed.  [name] has been going to all her scheduled appointments. She has been going to an in person depression social group. It has been really helpful. [name] is still very anxious. Her relationship with her fiance remains stressful. She hasn't been able to find a place to move yet but is still trying. She wakes up some mornings with bad anxiety. She denies any recent panic attacks. She takes Ativan BID and it calms her. [name] stopped Trintellix after 5 days due to SE. Her PTSD is not bothersome right now. Her dreams are fair and she is not having nightmares. Triggers cause intrusive memories. She has random times of hypervigilance. She denies any symptoms of mania or hypomania.   Observations/Objective:  General Appearance: unable to assess  Eye Contact:  unable to assess  Speech:  Clear and Coherent and Normal Rate  Volume:  Normal  Mood:  Anxious and Depressed  Affect:  Congruent and brighter and calmer than previous visits  Thought Process:  Coherent and Descriptions of Associations: Circumstantial  Orientation:  Full (Time, Place, and Person)  Thought Content:  Logical  Suicidal  Thoughts:  No  Homicidal Thoughts:  No  Memory:  Immediate;   Good  Judgement:  Poor  Insight:  Present  Psychomotor Activity: unable to assess  Concentration:  Concentration: Fair  Recall:  Good  Fund of Knowledge:  Good  Language:  Good  Akathisia:  unable to assess  Handed:  Right  AIMS (if indicated):     Assets:  Communication Skills Desire for Improvement Financial Resources/Insurance Talents/Skills Transportation  ADL's:  unable to assess  Cognition:  WNL  Sleep:         Assessment and Plan: Bipolar I d/o vs MDD, severe without  psychotic feataures; GAD; PTSD  D/c Depakote- [name] is not taking it  D/c Trintellix due to nausea SE  Ativan 0.5mg  po BID prn anxiety  Ritalin 5mg  po BID for  adjunctive depression treatment  [name] reports she has tried many meds in the past but has not been able to tolerate them due to SE or they have not been effective.   She would like to have treatment with [facility]   Follow Up Instructions: In 6- 8 weeks or sooner if needed   I discussed the assessment and treatment plan with the patient. The patient was provided an opportunity to ask questions and all were answered. The patient agreed with the plan and demonstrated an understanding of the instructions.   The patient was advised to call back or seek an in-person evaluation if the symptoms worsen or if the condition fails to improve as anticipated.  I provided 25 minutes of non-face-to-face time during this  encounter.   [name], MD

## 2019-06-14 NOTE — Telephone Encounter (Signed)
Clovis Fredrickson, [place]  06/12/2019 5:29 PM EDT    Per DPR left detailed message for patient regarding lab result. They verbalized understanding. No further questions.  Nothing further needed at this time.    Rigoberto Noel, MD  06/12/2019 9:27 AM EDT    Iron level is low Can she start taking iron supplements once daily x 2 months   Left message for patient to call back.

## 2019-06-15 NOTE — Telephone Encounter (Signed)
Not necessary, but can do so if symptoms persist

## 2019-06-15 NOTE — Telephone Encounter (Signed)
Attempted to call pt but unable to reach. Left detailed message for pt in regards to response from RA.nothing further needed.

## 2019-06-15 NOTE — Telephone Encounter (Signed)
Spoke with the pt  She understands to take iron supplement x 2 months  She wants to know if she needs to have labs repeated in 2 months  Please advise, thanks

## 2019-06-16 ENCOUNTER — Other Ambulatory Visit: Payer: Self-pay | Admitting: Pulmonary Disease

## 2019-06-16 DIAGNOSIS — G2581 Restless legs syndrome: Secondary | ICD-10-CM

## 2019-07-02 ENCOUNTER — Ambulatory Visit: Payer: Medicare Other | Attending: Internal Medicine

## 2019-07-02 DIAGNOSIS — Z23 Encounter for immunization: Secondary | ICD-10-CM

## 2019-07-02 NOTE — Progress Notes (Signed)
   Covid-19 Vaccination Clinic  Name:  [name]    MRN: [id number] DOB: 1945/04/22  07/02/2019  [name] was observed post Covid-19 immunization for 15 minutes without incident. She was provided with Vaccine Information Sheet and instruction to access the V-Safe system.   [name] was instructed to call 911 with any severe reactions post vaccine: Marland Kitchen Difficulty breathing  . Swelling of face and throat  . A fast heartbeat  . A bad rash all over body  . Dizziness and weakness   Immunizations Administered    Name Date Dose VIS Date Route   Pfizer COVID-19 Vaccine 07/02/2019 12:28 PM 0.3 mL 03/02/2019 Intramuscular   Manufacturer: [place]   Lot: [id number]   [place]: [id number]

## 2019-07-08 ENCOUNTER — Other Ambulatory Visit (HOSPITAL_COMMUNITY): Payer: Self-pay | Admitting: Psychiatry

## 2019-07-08 DIAGNOSIS — F3130 Bipolar disorder, current episode depressed, mild or moderate severity, unspecified: Secondary | ICD-10-CM

## 2019-07-13 ENCOUNTER — Other Ambulatory Visit: Payer: Self-pay | Admitting: Urology

## 2019-07-23 ENCOUNTER — Telehealth: Payer: Self-pay | Admitting: Cardiology

## 2019-07-23 ENCOUNTER — Encounter: Payer: Self-pay | Admitting: Acute Care

## 2019-07-23 ENCOUNTER — Ambulatory Visit (INDEPENDENT_AMBULATORY_CARE_PROVIDER_SITE_OTHER): Payer: Medicare Other | Admitting: Acute Care

## 2019-07-23 ENCOUNTER — Other Ambulatory Visit: Payer: Self-pay

## 2019-07-23 ENCOUNTER — Ambulatory Visit: Payer: Medicare Other | Attending: Internal Medicine

## 2019-07-23 ENCOUNTER — Encounter (HOSPITAL_COMMUNITY): Payer: Self-pay

## 2019-07-23 ENCOUNTER — Encounter (HOSPITAL_COMMUNITY)
Admission: RE | Admit: 2019-07-23 | Discharge: 2019-07-23 | Disposition: A | Discharge status: Home / Self Care | Payer: Medicare Other | Source: Ambulatory Visit | Attending: Urology | Admitting: Urology

## 2019-07-23 DIAGNOSIS — I480 Paroxysmal atrial fibrillation: Secondary | ICD-10-CM | POA: Insufficient documentation

## 2019-07-23 DIAGNOSIS — N189 Chronic kidney disease, unspecified: Secondary | ICD-10-CM | POA: Diagnosis not present

## 2019-07-23 DIAGNOSIS — G4733 Obstructive sleep apnea (adult) (pediatric): Secondary | ICD-10-CM | POA: Insufficient documentation

## 2019-07-23 DIAGNOSIS — Z01812 Encounter for preprocedural laboratory examination: Secondary | ICD-10-CM | POA: Diagnosis present

## 2019-07-23 DIAGNOSIS — F319 Bipolar disorder, unspecified: Secondary | ICD-10-CM | POA: Diagnosis not present

## 2019-07-23 DIAGNOSIS — Z7982 Long term (current) use of aspirin: Secondary | ICD-10-CM | POA: Diagnosis not present

## 2019-07-23 DIAGNOSIS — Z79899 Other long term (current) drug therapy: Secondary | ICD-10-CM | POA: Diagnosis not present

## 2019-07-23 DIAGNOSIS — I2510 Atherosclerotic heart disease of native coronary artery without angina pectoris: Secondary | ICD-10-CM | POA: Diagnosis not present

## 2019-07-23 DIAGNOSIS — F4310 Post-traumatic stress disorder, unspecified: Secondary | ICD-10-CM | POA: Diagnosis not present

## 2019-07-23 DIAGNOSIS — F419 Anxiety disorder, unspecified: Secondary | ICD-10-CM | POA: Diagnosis not present

## 2019-07-23 DIAGNOSIS — Z23 Encounter for immunization: Secondary | ICD-10-CM

## 2019-07-23 DIAGNOSIS — C641 Malignant neoplasm of right kidney, except renal pelvis: Secondary | ICD-10-CM

## 2019-07-23 DIAGNOSIS — N2889 Other specified disorders of kidney and ureter: Secondary | ICD-10-CM | POA: Insufficient documentation

## 2019-07-23 DIAGNOSIS — I129 Hypertensive chronic kidney disease with stage 1 through stage 4 chronic kidney disease, or unspecified chronic kidney disease: Secondary | ICD-10-CM | POA: Diagnosis not present

## 2019-07-23 DIAGNOSIS — G2581 Restless legs syndrome: Secondary | ICD-10-CM

## 2019-07-23 HISTORY — DX: Dyspnea, unspecified: R06.00

## 2019-07-23 NOTE — Progress Notes (Signed)
PCP - Dr. Edwin Dada. Cardiologist - Dr. Harrell Gave B.LOV: 02/13/19.  Chest x-ray - 10/31/18 EKG - 05/23/19 Stress Test -  ECHO -  Cardiac Cath -  CT angio: 10/31/18 Sleep Study -  CPAP -   Fasting Blood Sugar -  Checks Blood Sugar _____ times a day  Blood Thinner Instructions: Aspirin Instructions: Will be stopped three days before surgery. Last Dose:  Anesthesia review: Hx. HTN,CAD,PVC's.Pt. said she was told once that she was a difficult to intubate because she has small jaw.  Patient denies shortness of breath, fever, cough and chest pain at PAT appointment   Patient verbalized understanding of instructions that were given to them at the PAT appointment. Patient was also instructed that they will need to review over the PAT instructions again at home before surgery.

## 2019-07-23 NOTE — Progress Notes (Signed)
   Covid-19 Vaccination Clinic  Name:  [name]    MRN: [id number] DOB: [birth year]  07/23/2019  [name] was observed post Covid-19 immunization for 15 minutes without incident. She was provided with Vaccine Information Sheet and instruction to access the V-Safe system.   [name] was instructed to call 911 with any severe reactions post vaccine: Marland Kitchen Difficulty breathing  . Swelling of face and throat  . A fast heartbeat  . A bad rash all over body  . Dizziness and weakness   Immunizations Administered    Name Date Dose VIS Date Route   Pfizer COVID-19 Vaccine 07/23/2019  2:14 PM 0.3 mL 05/16/2018 Intramuscular   Manufacturer: [place]   Lot: [id number]   [place]: [id number]

## 2019-07-23 NOTE — Patient Instructions (Addendum)
It is good to speak with you today. We will clear you from a pulmonary perspective for surgery with the following recommendations. Tou do have a moderate risk of complications due to your underlying OSA.  Ameliorating Factors to prevent complications : We Recommend 1. Short duration of surgery as much as possible and avoid paralytic if possible 2. Recovery in step down unit  on CPAP or BiPAP until fully recovered from anesthesia 3 CPAP Q HS and with naps  while in the hospital>> Set pressure of 9 cm H2O 4. Saturation monitoring with sleep and oxygen as needed to maintain saturations > 88%   5. DVT prophylaxis ( PAS hose vs anticoagulation per surgeon) 6. Aggressive pulmonary toilet with O2, bronchodilatation, and incentive spirometry and early ambulation  7. Resume Requip 1 tablet (0.5 mg) by mouth daily after supper & take 2 tablets (1 mg) by mouth at bedtime once patient resumes taking po medications. This is her home regimen Follow up with Dr. Elsworth Soho or Judson Roch NP within 2 months or sooner if needed.  Please contact office for sooner follow up if symptoms do not improve or worsen or seek emergency care

## 2019-07-23 NOTE — Patient Instructions (Signed)
DUE TO COVID-19 ONLY ONE VISITOR IS ALLOWED TO COME WITH YOU AND STAY IN THE WAITING ROOM ONLY DURING PRE OP AND PROCEDURE DAY OF SURGERY. THE 1 VISITOR MAY VISIT WITH YOU AFTER SURGERY IN YOUR PRIVATE ROOM DURING VISITING HOURS ONLY!  YOU NEED TO HAVE A COVID 19 TEST ON: 07/27/19 @10 :00 am , THIS TEST MUST BE DONE BEFORE SURGERY, COME  [place], [place] , [address].  ([place]) ONCE YOUR COVID TEST IS COMPLETED, PLEASE BEGIN THE QUARANTINE INSTRUCTIONS AS OUTLINED IN YOUR HANDOUT.                [name]    Your procedure is scheduled on: 07/31/19   Report to [facility] Main  Entrance   Report to admitting at: 7:30  AM     Call this number if you have problems the morning of surgery [phone]    Remember: Do not eat solid food  :After Midnight. Clear liquid diet from midnight until 6:30 am.    CLEAR LIQUID DIET   Foods Allowed                                                                     Foods Excluded  Coffee and tea, regular and decaf                             liquids that you cannot  Plain Jell-O any favor except red or purple                                           see through such as: Fruit ices (not with fruit pulp)                                     milk, soups, orange juice  Iced Popsicles                                    All solid food Carbonated beverages, regular and diet                                    Cranberry, grape and apple juices Sports drinks like Gatorade Lightly seasoned clear broth or consume(fat free) Sugar, honey syrup  Sample Menu Breakfast                                Lunch                                     Supper Cranberry juice                    Beef broth  Chicken broth Jell-O                                     Grape juice                           Apple juice Coffee or tea                        Jell-O                                      Popsicle                                             Coffee or tea                        Coffee or tea  _____________________________________________________________________   BRUSH YOUR TEETH MORNING OF SURGERY AND RINSE YOUR MOUTH OUT, NO CHEWING GUM CANDY OR MINTS.     Take these medicines the morning of surgery with A SIP OF WATER: Bystolic,levothyroxine,lorazepam,methylphenidate.Use Flonase as usual.                                 You may not have any metal on your body including hair pins and              piercings  Do not wear jewelry, make-up, lotions, powders or perfumes, deodorant             Do not wear nail polish on your fingernails.  Do not shave  48 hours prior to surgery.               Do not bring valuables to the hospital. [place].  Contacts, dentures or bridgework may not be worn into surgery.  Leave suitcase in the car. After surgery it may be brought to your room.     Patients discharged the day of surgery will not be allowed to drive home. IF YOU ARE HAVING SURGERY AND GOING HOME THE SAME DAY, YOU MUST HAVE AN ADULT TO DRIVE YOU HOME AND BE WITH YOU FOR 24 HOURS. YOU MAY GO HOME BY TAXI OR UBER OR ORTHERWISE, BUT AN ADULT MUST ACCOMPANY YOU HOME AND STAY WITH YOU FOR 24 HOURS.  Name and phone number of your driver:  Special Instructions: N/A              Please read over the following fact sheets you were given: _____________________________________________________________________             [facility] - Preparing for Surgery Before surgery, you can play an important role.  Because skin is not sterile, your skin needs to be as free of germs as possible.  You can reduce the number of germs on your skin by washing with CHG (chlorahexidine gluconate) soap before surgery.  CHG is an antiseptic cleaner which kills germs and bonds with the skin to continue killing  germs even after washing. Please DO NOT use if you have an  allergy to CHG or antibacterial soaps.  If your skin becomes reddened/irritated stop using the CHG and inform your nurse when you arrive at Short Stay. Do not shave (including legs and underarms) for at least 48 hours prior to the first CHG shower.  You may shave your face/neck. Please follow these instructions carefully:  1.  Shower with CHG Soap the night before surgery and the  morning of Surgery.  2.  If you choose to wash your hair, wash your hair first as usual with your  normal  shampoo.  3.  After you shampoo, rinse your hair and body thoroughly to remove the  shampoo.                           4.  Use CHG as you would any other liquid soap.  You can apply chg directly  to the skin and wash                       Gently with a scrungie or clean washcloth.  5.  Apply the CHG Soap to your body ONLY FROM THE NECK DOWN.   Do not use on face/ open                           Wound or open sores. Avoid contact with eyes, ears mouth and genitals (private parts).                       Wash face,  Genitals (private parts) with your normal soap.             6.  Wash thoroughly, paying special attention to the area where your surgery  will be performed.  7.  Thoroughly rinse your body with warm water from the neck down.  8.  DO NOT shower/wash with your normal soap after using and rinsing off  the CHG Soap.                9.  Pat yourself dry with a clean towel.            10.  Wear clean pajamas.            11.  Place clean sheets on your bed the night of your first shower and do not  sleep with pets. Day of Surgery : Do not apply any lotions/deodorants the morning of surgery.  Please wear clean clothes to the hospital/surgery center.  FAILURE TO FOLLOW THESE INSTRUCTIONS MAY RESULT IN THE CANCELLATION OF YOUR SURGERY PATIENT SIGNATURE_________________________________  NURSE SIGNATURE__________________________________  ________________________________________________________________________ [facility] - Preparing for Surgery Before surgery, you can play an important role.  Because skin is not sterile, your skin needs to be as free of germs as possible.  You can reduce the number of germs on your skin by washing with CHG (chlorahexidine gluconate) soap before surgery.  CHG is an antiseptic cleaner which kills germs and bonds with the skin to continue killing germs even after washing. Please DO NOT use if you have an allergy to CHG or antibacterial soaps.  If your skin becomes reddened/irritated stop using the CHG and inform your nurse when you arrive at Short Stay. Do not shave (including legs and underarms) for at least 48 hours prior to the first CHG shower.  You may shave your face/neck. Please follow these instructions carefully:  1.  Shower with CHG Soap the night before surgery and the  morning of Surgery.  2.  If you choose to wash your hair, wash your hair first as usual with your  normal  shampoo.  3.  After you shampoo, rinse your hair and body thoroughly to remove the  shampoo.                           4.  Use CHG as you would any other liquid soap.  You can apply chg directly  to the skin and wash                       Gently with a scrungie or clean washcloth.  5.  Apply the CHG Soap to your body ONLY FROM THE NECK DOWN.   Do not use on face/ open                           Wound or open sores. Avoid contact with eyes, ears mouth and genitals (private parts).                       Wash face,  Genitals (private parts) with your normal soap.             6.  Wash thoroughly, paying special attention to the area where your surgery  will be performed.  7.  Thoroughly rinse your body with warm water from the neck down.  8.  DO NOT shower/wash with your normal soap after using and rinsing off  the CHG Soap.                9.  Pat yourself dry with a clean towel.            10.  Wear clean pajamas.            11.  Place clean sheets on your bed the night of your first shower and do not   sleep with pets. Day of Surgery : Do not apply any lotions/deodorants the morning of surgery.  Please wear clean clothes to the hospital/surgery center.  FAILURE TO FOLLOW THESE INSTRUCTIONS MAY RESULT IN THE CANCELLATION OF YOUR SURGERY PATIENT SIGNATURE_________________________________  NURSE SIGNATURE__________________________________  ________________________________________________________________________

## 2019-07-23 NOTE — Telephone Encounter (Signed)
   [facility] Pre-operative Risk Assessment    Request for surgical clearance:  1. What type of surgery is being performed? Laparoscopic Vasectomy of Right Kidney  2. When is this surgery scheduled? 07/31/19  3. What type of clearance is required (medical clearance vs. Pharmacy clearance to hold med vs. Both)? both  4. Are there any medications that need to be held prior to surgery and how long? Aspirin - 5 days prior  5. Practice name and name of physician performing surgery? Dr. Arnette Schaumann at [facility]  6. What is your office phone number [phone] ext [phone]   7.   What is your office fax number [phone]  8.   Anesthesia type (None, local, MAC, general) ? General    [name] 07/23/2019, 2:03 PM  _________________________________________________________________   (provider comments below)

## 2019-07-23 NOTE — Progress Notes (Signed)
Virtual Visit via Telephone Note  I connected with [name] on 07/23/19 at  4:00 PM EDT by telephone and verified that I am speaking with the correct person using two identifiers.  Location: Patient:  At home Provider: Working virtually from home   I discussed the limitations, risks, security and privacy concerns of performing an evaluation and management service by telephone and the availability of in person appointments. I also discussed with the patient that there may be a patient responsible charge related to this service. The patient expressed understanding and agreed to proceed.  Synopsis: 74 yo yowoman for FU of restless leg syndrome andOSA She has bipolar disorder and anxiety -on antidepressants for more than 30 years, now off 11/2016AHI was worse compared to 2004, she was started on CPAP of 9 cm but had poor compliance and this was discontinued by DME She underwent repeat home sleep study 11/2016 which showed AHI of 12/hour andwas started back on CPAP.   She has used Dental appliance in thepast but did not tolerate this well  Shewastapered off Prozac and her restless leg symptoms improved   Tremendously. She is followed by Dr. Elsworth Soho Last seen 05/2019 by Dr. Elsworth Soho . To qualify for an Inspire Device she would need to lose 10-20 pounds. ( BMI of 32 or lower is required)   History of Present Illness: Pt. Presents for a tele visit for surgical clearance for a right nephrectomy for renal cancer.. The surgery is scheduled for 07/31/2019 by Dr. Arnette Schaumann through [facility]. Per the patient the surgery will last 3-4 hours. . Patient's PCP has asked for cardiac and pulmonary clearance..Pt. is followed by Dr. Elsworth Soho for OSA and restless leg syndrome. She takes Requip for her RLS. She has been unsuccessful using CPAP.She endorses  no shortness of breath. She does continue to have Frequent night time awakening. She is in the process of losing a recommended 10-20 pounds  to qualify for an inspire device insertion. She does have additional risk of prolonged mechanical ventilation and pulmonary complications  due to her OSA . Please see the risk stratification and  list of recommendations below.    1) RISK FOR PROLONGED MECHANICAL VENTILAION - > 48h  1A) Arozullah - Prolonged mech ventilation risk Arozullah Postperative Pulmonary Risk Score - for mech ventilation dependence >48h [employer], Ann [facility]urg 2000, major non-cardiac surgery) Comment Score  Type of surgery - abd ao aneurysm (27), thoracic (21), neurosurgery / upper abdominal / vascular (21), neck (11) Right nephrectomy for renal cell carcinoma   21  Emergency Surgery - (11)    0  ALbumin < 3 or poor nutritional state - (9) Last labs 02/10/2019>> Albumin 4.1    0  BUN > 30 -  (8) Last labs 02/10/2019>> BUN 24    0  Partial or completely dependent functional status - (7)    0  COPD -  (6)    0  Age - 60 to 69 (4), > 70  (6)    6  TOTAL     27  Risk Stratifcation scores  - < 10 (0.5%), 11-19 (1.8%), 20-27 (4.2%), 28-40 (10.1%), >40 (26.6%)  20-27 (4.2%)   Major Pulmonary risks identified in the multifactorial risk analysis are but not limited to a) pneumonia; b) recurrent intubation risk; c) prolonged or recurrent acute respiratory failure needing mechanical ventilation; d) prolonged hospitalization; e) DVT/Pulmonary embolism; f) Acute Pulmonary edema  Moderate risk prolonged mechanical ventilation >> Score of 27  Observations/Objective: CTA 10/31/2018 Upper Abdomen: Visualized upper abdomen is notable for an 8.5 cm right upper pole renal cyst with calcifications and thin septations (series 4/image 84), chronic.  Spirometry 12/2016 Mild restriction  Home Sleep Study 11/2016>> Moderate OSA with hypopneas causing oxygen desaturations This is not currently being treated due to inability to comply with CPAP therapy. Pt. Is working on weight loss for Inspire device cnsideration   Assessment  and Plan: Pt is cleared from a pulmonary perspective for surgery with the below noted recommendations Moderate risk Pulmonary Complications Follow recommendations noted below Plan Recommend 1. Short duration of surgery as much as possible and avoid paralytic if possible 2. Recovery in step down unit  on CPAP or BiPAP until fully recovered from anesthesia 3 CPAP Q HS and with naps  while in the hospital>> Set pressure of 9 cm H2O 4. Saturation monitoring with sleep and oxygen as needed to maintain saturations > 88%   5. DVT prophylaxis ( PAS hose vs anticoagulation per surgeon) 6. Aggressive pulmonary toilet with O2, bronchodilatation, and incentive spirometry and early ambulation  7. Resume Requip 1 tablet (0.5 mg) by mouth daily after supper & take 2 tablets (1 mg) by mouth at bedtime once patient resumes taking po medications. This is her home regimen  I have discussed these recommendation with the patient. She verbalized understanding.  There was no paperwork supplied for signature for surgical clearance. Patient will ask surgeon and  PCP to read clearance note.  Follow Up Instructions: Follow up with Dr. Jalene Mullet NP as needed post op.   I discussed the assessment and treatment plan with the patient. The patient was provided an opportunity to ask questions and all were answered. The patient agreed with the plan and demonstrated an understanding of the instructions.   The patient was advised to call back or seek an in-person evaluation if the symptoms worsen or if the condition fails to improve as anticipated.  I provided 40  minutes of non-face-to-face time during this encounter.   Magdalen Spatz, NP 07/23/2019 4:45 PM

## 2019-07-24 ENCOUNTER — Encounter (HOSPITAL_COMMUNITY)
Admission: RE | Admit: 2019-07-24 | Discharge: 2019-07-24 | Disposition: A | Discharge status: Home / Self Care | Payer: Medicare Other | Source: Ambulatory Visit | Attending: Urology | Admitting: Urology

## 2019-07-24 DIAGNOSIS — Z01812 Encounter for preprocedural laboratory examination: Secondary | ICD-10-CM | POA: Diagnosis not present

## 2019-07-24 LAB — BASIC METABOLIC PANEL
Anion gap: 5 (ref 5–15)
BUN: 23 mg/dL (ref 8–23)
CO2: 29 mmol/L (ref 22–32)
Calcium: 9.3 mg/dL (ref 8.9–10.3)
Chloride: 106 mmol/L (ref 98–111)
Creatinine, Ser: 0.96 mg/dL (ref 0.44–1.00)
GFR calc Af Amer: >60 mL/min (ref >60)
GFR calc non Af Amer: 58 mL/min — ABNORMAL LOW (ref >60)
Glucose, Bld: 113 mg/dL — ABNORMAL HIGH (ref 70–99)
Potassium: 3.8 mmol/L (ref 3.5–5.1)
Sodium: 140 mmol/L (ref 135–145)

## 2019-07-24 LAB — CBC
HCT: 36.1 % (ref 36.0–46.0)
Hemoglobin: 11.2 g/dL — ABNORMAL LOW (ref 12.0–15.0)
MCH: 25.9 pg — ABNORMAL LOW (ref 26.0–34.0)
MCHC: 31 g/dL (ref 30.0–36.0)
MCV: 83.4 fL (ref 80.0–100.0)
Platelets: 302 10*3/uL (ref 150–400)
RBC: 4.33 MIL/uL (ref 3.87–5.11)
RDW: 14.7 % (ref 11.5–15.5)
WBC: 5.7 10*3/uL (ref 4.0–10.5)
nRBC: 0 % (ref 0.0–0.2)

## 2019-07-24 NOTE — Telephone Encounter (Signed)
   Primary Cardiologist: Buford Dresser, MD  Chart reviewed as part of pre-operative protocol coverage. Given past medical history and time since last visit, based on ACC/AHA guidelines, [name] would be at acceptable risk for the planned procedure without further cardiovascular testing. Okay to hold ASA for procedure. Start again ASAP thereafter.   I will route this recommendation to the requesting party via Epic fax function and remove from pre-op pool.  Please call with questions.  Phill Myron. Jodilyn Giese DNP, ANP, [place]  07/24/2019, 8:29 AM

## 2019-07-25 NOTE — Progress Notes (Signed)
Anesthesia Chart Review   Case: [id number] Date/Time: 07/31/19 0915   Procedure: HAND ASSISTED LAPAROSCOPIC NEPHRECTOMY (Right ) - 3 HRS   Anesthesia type: General   Pre-op diagnosis: RIGHT RENAL MASS   Location: [facility]   Surgeons: Robley Fries, MD      DISCUSSION:73 y.o. never smoker with h/o PONV, GERD, OSA, PTSD, Bipolar I, migraines, CAD, paroxysmal atrial tachycardia s/p ablation, OSA, right renal mass scheduled for above procedure 07/31/2019 with Dr. Jacalyn Lefevre.   Per cardiology telephone encounter 07/24/2019, "Chart reviewed as part of pre-operative protocol coverage. Given past medical history and time since last visit, based on ACC/AHA guidelines, [name] would be at acceptable risk for the planned procedure without further cardiovascular testing. Okay to hold ASA for procedure. Start again ASAP thereafter."  Pt reports h/o difficult intubation 15 years ago in [place], reports several surgeries since with no problems.  S/p D&C/hysteroscopy 04/04/2012 with LMA.    Anticipate pt can proceed with planned procedure barring acute status change.   VS: BP 135/76   Pulse 66   Temp 37.1 C (Oral)   Resp 16   Ht 5\' 4"  (1.626 m)   Wt 96.2 kg   SpO2 99%   BMI 36.39 kg/m   PROVIDERS: Leighton Ruff, MD is PCP   Buford Dresser, MD is Cardiologist  LABS: Labs reviewed: Acceptable for surgery. (all labs ordered are listed, but only abnormal results are displayed)  Labs Reviewed  BASIC METABOLIC PANEL - Abnormal; Notable for the following components:      Result Value   Glucose, Bld 113 (*)    GFR calc non Af Amer 58 (*)    All other components within normal limits  CBC - Abnormal; Notable for the following components:   Hemoglobin 11.2 (*)    MCH 25.9 (*)    All other components within normal limits     IMAGES:   EKG: 05/23/2019 Rate 65 bpm  Normal sinus rhythm   CV: Cardiac Cath 06/08/2016 Conclusion   50-70% stenosis in the  mid LAD proximal to the first diagonal. FFR not performed because of difficulty with catheter control from the right radial.   75-80% stenosis in the midsegment of the first obtuse marginal.   Luminal irregularities in the mid RCA.   Overall normal LV function with EF percent.   Recommendations:    Aggressive risk factor modification and anti-ischemic therapy ( beta blocker, statin therapy and possibly LA nitrates) in this non-ACS subset. PCI in this setting does not meet appropriate use criteria.   If limiting symptoms on medications or convincing evidence of ischemia, consider obtuse marginal PCI and/or higher than usual risk PCI on the LAD (due to calcification and angulation).     Myocardial Perfusion 09/19/2014  Nuclear stress EF: 70%.  Horizontal ST segment depression ST segment depression of 1 mm was noted during stress in the V5, V6, aVF, II, III and I leads, and returning to baseline after 5-9 minutes of recovery.  The study is normal.  This is a low risk study.  The left ventricular ejection fraction is hyperdynamic (>65%).   Low risk stress nuclear study with normal perfusion and normal left ventricular regional and global systolic function. Abnormal ECG response may represent a "false positive".  Echo 04/22/2013 Study Conclusions   - Left ventricle: The cavity size was normal. Systolic  function was normal. The estimated ejection fraction was  in the range of 55% to 60%. Wall  motion was normal; there  were no regional wall motion abnormalities. Left  ventricular diastolic function parameters were normal.  - Pulmonary arteries: PA peak pressure: 70mm Hg (S).   Past Medical History:  Diagnosis Date  . Abscess of Bartholin's gland   . Acute vestibular neuronitis    Dr Lucia Gaskins  . ADHD (attention deficit hyperactivity disorder)   . Adverse effect of general anesthetic    felt paralyzed while receiving anesthesia  . Anxiety   . Bipolar 1 disorder ([place])   . CAD  (coronary artery disease), native coronary artery    cath 05/2016 showing 50-70% stenosis in the mid LAD proximal to the first diagonal and 70-80% small OM1. FFR of LAD  not performed because of difficulty with catheter control from the right radial.   . Chronic kidney disease    kidney cancer- pt states she has elected to not have it treated.  . Depression    Bipolar disorder/goes to [facility] for meds  . Difficult intubation    told by [facility] that she was hard to intubate 15b yrs ago in [place]- surgery since then no problems  . Dyspnea   . Dysrhythmia    tachycardia  . GERD (gastroesophageal reflux disease)   . H/O echocardiogram 07/2012   [place] LVF w grade I siastolic dysfunction   . History of attention deficit disorder   . History of endometriosis   . History of pleural effusion   . Hyperlipidemia LDL goal <70 06/24/2016  . Hypertension   . 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 Memory difficulty 01/26/2017  . Migraines   . Obesity   . OSA (obstructive sleep apnea)    uses oral appliance instead of CPAP  . PAT (paroxysmal atrial tachycardia) (HCC)    s/p ablation  . PONV (postoperative nausea and vomiting)   . PTSD (post-traumatic stress disorder)   . PVC's (premature ventricular contractions)   . Renal mass 02/15/2012  . RLS (restless legs syndrome)    Dr Gwenette Greet  . rt renal ca dx'd 12/2009   no treatment/ no surg  . Vertigo     Past Surgical History:  Procedure Laterality Date  . CARDIAC ELECTROPHYSIOLOGY [place] AND ABLATION  2000s  . laparoscopy    . LEFT HEART CATH AND CORONARY ANGIOGRAPHY N/A 06/08/2016   Procedure: Left Heart Cath and Coronary Angiography;  Surgeon: Belva Crome, MD;  Location: [facility];  Service: Cardiovascular;  Laterality: N/A;  . nasoseptal reconstruction  1990s  . THYROIDECTOMY  1990  . TONSILLECTOMY     as a child  . TUBAL LIGATION  1970s  . uterine mass removal  03/2012   was found to be benign     MEDICATIONS: . aspirin EC 81 MG tablet  . b complex vitamins tablet  . BYSTOLIC 2.5 MG tablet  . diphenhydrAMINE (BENADRYL) 25 mg capsule  . ferrous sulfate 325 (65 FE) MG tablet  . fluticasone (FLONASE) 50 MCG/ACT nasal spray  . Ginger, Zingiber officinalis, (GINGER PO)  . ibuprofen (ADVIL) 200 MG tablet  . levothyroxine (SYNTHROID) 75 MCG tablet  . LORazepam (ATIVAN) 0.5 MG tablet  . losartan-hydrochlorothiazide (HYZAAR) 50-12.5 MG tablet  . magnesium gluconate (MAGONATE) 500 MG tablet  . melatonin 5 MG TABS  . Menthol, Topical Analgesic, (BLUE GEL EX)  . methylphenidate (RITALIN) 5 MG tablet  . methylphenidate (RITALIN) 5 MG tablet  . Omega 3 1000 MG CAPS  . ondansetron (ZOFRAN) 8 MG  tablet  . PARoxetine (PAXIL) 10 MG tablet  . rOPINIRole (REQUIP) 0.5 MG tablet  . rosuvastatin (CRESTOR) 5 MG tablet  . Vitamin D, Cholecalciferol, 50 MCG (2000 UT) CAPS   No current facility-administered medications for this encounter.   Maia Plan [facility] Pre-Surgical Testing [phone] 07/25/19  3:37 PM

## 2019-07-27 ENCOUNTER — Other Ambulatory Visit (HOSPITAL_COMMUNITY)
Admission: RE | Admit: 2019-07-27 | Discharge: 2019-07-27 | Disposition: A | Discharge status: Home / Self Care | Payer: Medicare Other | Source: Ambulatory Visit | Attending: Urology | Admitting: Urology

## 2019-07-27 DIAGNOSIS — Z20822 Contact with and (suspected) exposure to covid-19: Secondary | ICD-10-CM | POA: Diagnosis not present

## 2019-07-27 DIAGNOSIS — Z01812 Encounter for preprocedural laboratory examination: Secondary | ICD-10-CM | POA: Diagnosis present

## 2019-07-27 LAB — SARS CORONAVIRUS 2 (TAT 6-24 HRS): SARS Coronavirus 2: NEGATIVE

## 2019-07-31 ENCOUNTER — Other Ambulatory Visit: Payer: Self-pay

## 2019-07-31 ENCOUNTER — Inpatient Hospital Stay (HOSPITAL_COMMUNITY): Payer: Medicare Other | Admitting: Physician Assistant

## 2019-07-31 ENCOUNTER — Inpatient Hospital Stay (HOSPITAL_COMMUNITY): Payer: Medicare Other | Admitting: Anesthesiology

## 2019-07-31 ENCOUNTER — Inpatient Hospital Stay (HOSPITAL_COMMUNITY)
Admission: RE | Admit: 2019-07-31 | Discharge: 2019-08-07 | DRG: 657 | Disposition: A | Discharge status: Skilled Nursing Facility | Payer: Medicare Other | Source: Ambulatory Visit | Attending: Urology | Admitting: Urology

## 2019-07-31 ENCOUNTER — Encounter (HOSPITAL_COMMUNITY)
Admission: RE | Disposition: A | Discharge status: Skilled Nursing Facility | Payer: Self-pay | Source: Ambulatory Visit | Attending: Urology

## 2019-07-31 ENCOUNTER — Encounter (HOSPITAL_COMMUNITY): Payer: Self-pay | Admitting: Urology

## 2019-07-31 DIAGNOSIS — G4733 Obstructive sleep apnea (adult) (pediatric): Secondary | ICD-10-CM | POA: Diagnosis present

## 2019-07-31 DIAGNOSIS — Z20822 Contact with and (suspected) exposure to covid-19: Secondary | ICD-10-CM | POA: Diagnosis present

## 2019-07-31 DIAGNOSIS — D62 Acute posthemorrhagic anemia: Secondary | ICD-10-CM | POA: Diagnosis not present

## 2019-07-31 DIAGNOSIS — C641 Malignant neoplasm of right kidney, except renal pelvis: Principal | ICD-10-CM | POA: Diagnosis present

## 2019-07-31 DIAGNOSIS — F909 Attention-deficit hyperactivity disorder, unspecified type: Secondary | ICD-10-CM | POA: Diagnosis present

## 2019-07-31 DIAGNOSIS — K219 Gastro-esophageal reflux disease without esophagitis: Secondary | ICD-10-CM | POA: Diagnosis present

## 2019-07-31 DIAGNOSIS — I10 Essential (primary) hypertension: Secondary | ICD-10-CM | POA: Diagnosis present

## 2019-07-31 DIAGNOSIS — Z5331 Laparoscopic surgical procedure converted to open procedure: Secondary | ICD-10-CM

## 2019-07-31 DIAGNOSIS — I2510 Atherosclerotic heart disease of native coronary artery without angina pectoris: Secondary | ICD-10-CM | POA: Diagnosis not present

## 2019-07-31 DIAGNOSIS — F329 Major depressive disorder, single episode, unspecified: Secondary | ICD-10-CM | POA: Diagnosis not present

## 2019-07-31 DIAGNOSIS — N2889 Other specified disorders of kidney and ureter: Secondary | ICD-10-CM | POA: Diagnosis present

## 2019-07-31 DIAGNOSIS — Z800 Family history of malignant neoplasm of digestive organs: Secondary | ICD-10-CM | POA: Diagnosis not present

## 2019-07-31 DIAGNOSIS — F319 Bipolar disorder, unspecified: Secondary | ICD-10-CM | POA: Diagnosis present

## 2019-07-31 DIAGNOSIS — Z23 Encounter for immunization: Secondary | ICD-10-CM

## 2019-07-31 DIAGNOSIS — Z808 Family history of malignant neoplasm of other organs or systems: Secondary | ICD-10-CM

## 2019-07-31 DIAGNOSIS — E890 Postprocedural hypothyroidism: Secondary | ICD-10-CM | POA: Diagnosis present

## 2019-07-31 DIAGNOSIS — F332 Major depressive disorder, recurrent severe without psychotic features: Secondary | ICD-10-CM | POA: Diagnosis present

## 2019-07-31 DIAGNOSIS — F411 Generalized anxiety disorder: Secondary | ICD-10-CM | POA: Diagnosis not present

## 2019-07-31 DIAGNOSIS — E785 Hyperlipidemia, unspecified: Secondary | ICD-10-CM | POA: Diagnosis not present

## 2019-07-31 DIAGNOSIS — F32A Depression, unspecified: Secondary | ICD-10-CM

## 2019-07-31 DIAGNOSIS — G2581 Restless legs syndrome: Secondary | ICD-10-CM | POA: Diagnosis not present

## 2019-07-31 DIAGNOSIS — F4310 Post-traumatic stress disorder, unspecified: Secondary | ICD-10-CM | POA: Diagnosis present

## 2019-07-31 HISTORY — PX: LAPAROSCOPIC NEPHRECTOMY, HAND ASSISTED: SHX1929

## 2019-07-31 LAB — TYPE AND SCREEN
ABO/RH(D): O NEG
Antibody Screen: NEGATIVE

## 2019-07-31 LAB — ABO/RH: ABO/RH(D): O NEG

## 2019-07-31 LAB — HEMOGLOBIN AND HEMATOCRIT, BLOOD
HCT: 35.8 % — ABNORMAL LOW (ref 36.0–46.0)
Hemoglobin: 11.2 g/dL — ABNORMAL LOW (ref 12.0–15.0)

## 2019-07-31 SURGERY — NEPHRECTOMY, HAND-ASSISTED, LAPAROSCOPIC
Anesthesia: General | Laterality: Right

## 2019-07-31 MED ORDER — FENTANYL CITRATE (PF) 100 MCG/2ML IJ SOLN
INTRAMUSCULAR | Status: AC
  Filled 2019-07-31: qty 2

## 2019-07-31 MED ORDER — CEFAZOLIN SODIUM-DEXTROSE 2-4 GM/100ML-% IV SOLN
2.0000 g | INTRAVENOUS | Status: AC
  Administered 2019-07-31: 2 g via INTRAVENOUS
  Filled 2019-07-31: qty 100

## 2019-07-31 MED ORDER — MIDAZOLAM HCL 2 MG/2ML IJ SOLN
INTRAMUSCULAR | Status: AC
  Filled 2019-07-31: qty 2

## 2019-07-31 MED ORDER — SODIUM CHLORIDE (PF) 0.9 % IJ SOLN
INTRAMUSCULAR | Status: AC
  Filled 2019-07-31: qty 50

## 2019-07-31 MED ORDER — PHENYLEPHRINE 40 MCG/ML (10ML) SYRINGE FOR IV PUSH (FOR BLOOD PRESSURE SUPPORT)
PREFILLED_SYRINGE | INTRAVENOUS | Status: DC | PRN
  Administered 2019-07-31 (×3): 120 ug via INTRAVENOUS

## 2019-07-31 MED ORDER — BELLADONNA ALKALOIDS-OPIUM 16.2-60 MG RE SUPP: 1.0000 | Freq: Four times a day (QID) | RECTAL | Status: DC | PRN

## 2019-07-31 MED ORDER — CEFAZOLIN SODIUM-DEXTROSE 1-4 GM/50ML-% IV SOLN
1.0000 g | Freq: Three times a day (TID) | INTRAVENOUS | Status: DC
  Administered 2019-07-31 – 2019-08-02 (×8): 1 g via INTRAVENOUS
  Filled 2019-07-31 (×8): qty 50

## 2019-07-31 MED ORDER — LIDOCAINE 2% (20 MG/ML) 5 ML SYRINGE
INTRAMUSCULAR | Status: AC
  Filled 2019-07-31: qty 5

## 2019-07-31 MED ORDER — METHYLPHENIDATE HCL 10 MG PO TABS: 5.0000 mg | ORAL_TABLET | Freq: Two times a day (BID) | ORAL | Status: DC

## 2019-07-31 MED ORDER — POLYVINYL ALCOHOL 1.4 % OP SOLN
1.0000 [drp] | OPHTHALMIC | Status: DC | PRN
  Administered 2019-07-31: 1 [drp] via OPHTHALMIC
  Filled 2019-07-31 (×2): qty 15

## 2019-07-31 MED ORDER — EPHEDRINE SULFATE 50 MG/ML IJ SOLN
INTRAMUSCULAR | Status: DC | PRN
Start: 2019-07-31 — End: 2019-07-31
  Administered 2019-07-31: 10 mg via INTRAVENOUS
  Administered 2019-07-31: 15 mg via INTRAVENOUS
  Administered 2019-07-31: 10 mg via INTRAVENOUS

## 2019-07-31 MED ORDER — LORAZEPAM 0.5 MG PO TABS
0.5000 mg | ORAL_TABLET | Freq: Three times a day (TID) | ORAL | Status: DC | PRN
  Administered 2019-07-31 – 2019-08-07 (×17): 0.5 mg via ORAL
  Filled 2019-07-31 (×19): qty 1

## 2019-07-31 MED ORDER — SODIUM CHLORIDE BACTERIOSTATIC 0.9 % IJ SOLN
INTRAMUSCULAR | Status: DC | PRN
  Administered 2019-07-31: 40 mL

## 2019-07-31 MED ORDER — NEBIVOLOL HCL 2.5 MG PO TABS: 2.5000 mg | ORAL_TABLET | Freq: Every day | ORAL | Status: DC

## 2019-07-31 MED ORDER — LACTATED RINGERS IV SOLN: INTRAVENOUS | Status: DC

## 2019-07-31 MED ORDER — ACETAMINOPHEN 325 MG PO TABS: 650.0000 mg | ORAL_TABLET | ORAL | Status: DC | PRN

## 2019-07-31 MED ORDER — OXYCODONE HCL 5 MG PO TABS
5.0000 mg | ORAL_TABLET | ORAL | Status: DC | PRN
  Administered 2019-08-01 – 2019-08-03 (×4): 5 mg via ORAL
  Filled 2019-07-31 (×5): qty 1

## 2019-07-31 MED ORDER — PNEUMOCOCCAL VAC POLYVALENT 25 MCG/0.5ML IJ INJ
0.5000 mL | INJECTION | INTRAMUSCULAR | Status: AC
  Administered 2019-08-01: 0.5 mL via INTRAMUSCULAR
  Filled 2019-07-31: qty 0.5

## 2019-07-31 MED ORDER — LACTATED RINGERS IV SOLN
INTRAVENOUS | Status: AC | PRN
  Administered 2019-07-31: 1000 mL via INTRAVENOUS

## 2019-07-31 MED ORDER — LIDOCAINE 2% (20 MG/ML) 5 ML SYRINGE
INTRAMUSCULAR | Status: DC | PRN
  Administered 2019-07-31: 100 mg via INTRAVENOUS

## 2019-07-31 MED ORDER — ONDANSETRON HCL 4 MG/2ML IJ SOLN
4.0000 mg | INTRAMUSCULAR | Status: DC | PRN
  Administered 2019-07-31 – 2019-08-07 (×6): 4 mg via INTRAVENOUS
  Filled 2019-07-31 (×7): qty 2

## 2019-07-31 MED ORDER — DIPHENHYDRAMINE HCL 12.5 MG/5ML PO ELIX
12.5000 mg | ORAL_SOLUTION | Freq: Four times a day (QID) | ORAL | Status: DC | PRN
  Administered 2019-08-06: 12.5 mg via ORAL
  Filled 2019-07-31: qty 5

## 2019-07-31 MED ORDER — ONDANSETRON HCL 4 MG/2ML IJ SOLN
INTRAMUSCULAR | Status: AC
  Filled 2019-07-31: qty 2

## 2019-07-31 MED ORDER — FENTANYL CITRATE (PF) 100 MCG/2ML IJ SOLN
25.0000 ug | INTRAMUSCULAR | Status: DC | PRN
  Administered 2019-07-31 (×3): 50 ug via INTRAVENOUS

## 2019-07-31 MED ORDER — PHENYLEPHRINE HCL (PRESSORS) 10 MG/ML IV SOLN
INTRAVENOUS | Status: AC
  Filled 2019-07-31: qty 1

## 2019-07-31 MED ORDER — PHENYLEPHRINE HCL-NACL 10-0.9 MG/250ML-% IV SOLN
INTRAVENOUS | Status: DC | PRN
  Administered 2019-07-31: 40 ug/min via INTRAVENOUS

## 2019-07-31 MED ORDER — ROSUVASTATIN CALCIUM 5 MG PO TABS
5.0000 mg | ORAL_TABLET | Freq: Every day | ORAL | Status: DC
  Administered 2019-08-01 – 2019-08-04 (×3): 5 mg via ORAL
  Filled 2019-07-31 (×7): qty 1

## 2019-07-31 MED ORDER — ROCURONIUM BROMIDE 10 MG/ML (PF) SYRINGE
PREFILLED_SYRINGE | INTRAVENOUS | Status: DC | PRN
  Administered 2019-07-31: 15 mg via INTRAVENOUS
  Administered 2019-07-31: 20 mg via INTRAVENOUS
  Administered 2019-07-31: 60 mg via INTRAVENOUS

## 2019-07-31 MED ORDER — ROPINIROLE HCL 1 MG PO TABS
1.0000 mg | ORAL_TABLET | Freq: Every day | ORAL | Status: DC
  Administered 2019-07-31 – 2019-08-06 (×7): 1 mg via ORAL
  Filled 2019-07-31 (×7): qty 1

## 2019-07-31 MED ORDER — ONDANSETRON HCL 4 MG/2ML IJ SOLN
INTRAMUSCULAR | Status: DC | PRN
  Administered 2019-07-31: 4 mg via INTRAVENOUS

## 2019-07-31 MED ORDER — 0.9 % SODIUM CHLORIDE (POUR BTL) OPTIME
TOPICAL | Status: DC | PRN
  Administered 2019-07-31: 1000 mL

## 2019-07-31 MED ORDER — PROPOFOL 10 MG/ML IV BOLUS
INTRAVENOUS | Status: DC | PRN
  Administered 2019-07-31: 150 mg via INTRAVENOUS

## 2019-07-31 MED ORDER — DIPHENHYDRAMINE HCL 50 MG/ML IJ SOLN: 12.5000 mg | Freq: Four times a day (QID) | INTRAMUSCULAR | Status: DC | PRN

## 2019-07-31 MED ORDER — SUCCINYLCHOLINE CHLORIDE 200 MG/10ML IV SOSY
PREFILLED_SYRINGE | INTRAVENOUS | Status: DC | PRN
  Administered 2019-07-31: 120 mg via INTRAVENOUS

## 2019-07-31 MED ORDER — SUCCINYLCHOLINE CHLORIDE 200 MG/10ML IV SOSY
PREFILLED_SYRINGE | INTRAVENOUS | Status: AC
  Filled 2019-07-31: qty 10

## 2019-07-31 MED ORDER — HYDROMORPHONE HCL 1 MG/ML IJ SOLN
0.5000 mg | INTRAMUSCULAR | Status: DC | PRN
  Administered 2019-07-31 – 2019-08-06 (×16): 1 mg via INTRAVENOUS
  Filled 2019-07-31 (×16): qty 1

## 2019-07-31 MED ORDER — ACETAMINOPHEN 500 MG PO TABS
1000.0000 mg | ORAL_TABLET | Freq: Once | ORAL | Status: AC
  Administered 2019-07-31: 1000 mg via ORAL
  Filled 2019-07-31: qty 2

## 2019-07-31 MED ORDER — ALBUMIN HUMAN 5 % IV SOLN: INTRAVENOUS | Status: DC | PRN

## 2019-07-31 MED ORDER — BUPIVACAINE LIPOSOME 1.3 % IJ SUSP
20.0000 mL | Freq: Once | INTRAMUSCULAR | Status: AC
  Administered 2019-07-31: 20 mL
  Filled 2019-07-31: qty 20

## 2019-07-31 MED ORDER — DOCUSATE SODIUM 100 MG PO CAPS
100.0000 mg | ORAL_CAPSULE | Freq: Two times a day (BID) | ORAL | Status: DC
  Administered 2019-08-01 – 2019-08-07 (×13): 100 mg via ORAL
  Filled 2019-07-31 (×14): qty 1

## 2019-07-31 MED ORDER — LEVOTHYROXINE SODIUM 75 MCG PO TABS
75.0000 ug | ORAL_TABLET | Freq: Every day | ORAL | Status: DC
  Administered 2019-08-01 – 2019-08-07 (×7): 75 ug via ORAL
  Filled 2019-07-31 (×7): qty 1

## 2019-07-31 MED ORDER — MIDAZOLAM HCL 2 MG/2ML IJ SOLN
INTRAMUSCULAR | Status: DC | PRN
  Administered 2019-07-31: 2 mg via INTRAVENOUS

## 2019-07-31 MED ORDER — METHYLPHENIDATE HCL 10 MG PO TABS
5.0000 mg | ORAL_TABLET | Freq: Two times a day (BID) | ORAL | Status: DC
  Administered 2019-08-01 – 2019-08-07 (×6): 5 mg via ORAL
  Filled 2019-07-31 (×10): qty 1

## 2019-07-31 MED ORDER — ROPINIROLE HCL 0.5 MG PO TABS
0.5000 mg | ORAL_TABLET | Freq: Every day | ORAL | Status: DC
  Administered 2019-07-31 – 2019-08-06 (×7): 0.5 mg via ORAL
  Filled 2019-07-31 (×7): qty 1

## 2019-07-31 MED ORDER — DEXTROSE-NACL 5-0.45 % IV SOLN: INTRAVENOUS | Status: DC

## 2019-07-31 MED ORDER — FENTANYL CITRATE (PF) 100 MCG/2ML IJ SOLN
INTRAMUSCULAR | Status: DC | PRN
  Administered 2019-07-31: 50 ug via INTRAVENOUS
  Administered 2019-07-31: 100 ug via INTRAVENOUS
  Administered 2019-07-31 (×3): 50 ug via INTRAVENOUS

## 2019-07-31 MED ORDER — LACTATED RINGERS IV SOLN: INTRAVENOUS | Status: DC | PRN

## 2019-07-31 MED ORDER — CHLORHEXIDINE GLUCONATE CLOTH 2 % EX PADS
6.0000 | MEDICATED_PAD | Freq: Every day | CUTANEOUS | Status: DC
  Administered 2019-08-01 – 2019-08-02 (×2): 6 via TOPICAL

## 2019-07-31 MED ORDER — AMISULPRIDE (ANTIEMETIC) 5 MG/2ML IV SOLN: 10.0000 mg | Freq: Once | INTRAVENOUS | Status: DC | PRN

## 2019-07-31 MED ORDER — PROPOFOL 10 MG/ML IV BOLUS
INTRAVENOUS | Status: AC
  Filled 2019-07-31: qty 20

## 2019-07-31 MED ORDER — SUGAMMADEX SODIUM 200 MG/2ML IV SOLN
INTRAVENOUS | Status: DC | PRN
  Administered 2019-07-31: 200 mg via INTRAVENOUS

## 2019-07-31 MED ORDER — FERROUS SULFATE 325 (65 FE) MG PO TABS
325.0000 mg | ORAL_TABLET | Freq: Every day | ORAL | Status: DC
  Administered 2019-08-01 – 2019-08-04 (×3): 325 mg via ORAL
  Filled 2019-07-31 (×6): qty 1

## 2019-07-31 MED ORDER — DEXAMETHASONE SODIUM PHOSPHATE 10 MG/ML IJ SOLN
INTRAMUSCULAR | Status: DC | PRN
  Administered 2019-07-31: 8 mg via INTRAVENOUS

## 2019-07-31 MED ORDER — DEXAMETHASONE SODIUM PHOSPHATE 10 MG/ML IJ SOLN
INTRAMUSCULAR | Status: AC
  Filled 2019-07-31: qty 1

## 2019-07-31 MED ORDER — NEBIVOLOL HCL 2.5 MG PO TABS
2.5000 mg | ORAL_TABLET | Freq: Every day | ORAL | Status: DC
  Administered 2019-08-02 – 2019-08-07 (×6): 2.5 mg via ORAL
  Filled 2019-07-31 (×7): qty 1

## 2019-07-31 SURGICAL SUPPLY — 77 items
ADH SKN CLS APL DERMABOND .7 (GAUZE/BANDAGES/DRESSINGS) ×1
AGENT HMST KT MTR STRL THRMB (HEMOSTASIS) ×1
APL PRP STRL LF DISP 70% ISPRP (MISCELLANEOUS) ×2
BAG LAPAROSCOPIC 12 15 PORT 16 (BASKET) IMPLANT
BAG RETRIEVAL 12/15 (BASKET)
BAG RETRIEVAL 12/15MM (BASKET)
BAG SPEC RTRVL LRG 6X4 10 (ENDOMECHANICALS)
BAG SPEC THK2 15X12 ZIP CLS (MISCELLANEOUS) ×1
BAG ZIPLOCK 12X15 (MISCELLANEOUS) ×3 IMPLANT
BLADE EXTENDED COATED 6.5IN (ELECTRODE) IMPLANT
BLADE SURG SZ10 CARB STEEL (BLADE) IMPLANT
CABLE HIGH FREQUENCY MONO STRZ (ELECTRODE) ×3 IMPLANT
CHLORAPREP W/TINT 26 (MISCELLANEOUS) ×5 IMPLANT
CLIP VESOLOCK LG 6/CT PURPLE (CLIP) ×3 IMPLANT
CLIP VESOLOCK MED LG 6/CT (CLIP) IMPLANT
CLIP VESOLOCK XL 6/CT (CLIP) IMPLANT
COVER SURGICAL LIGHT HANDLE (MISCELLANEOUS) ×3 IMPLANT
COVER WAND RF STERILE (DRAPES) ×2 IMPLANT
CUTTER FLEX LINEAR 45M (STAPLE) IMPLANT
DERMABOND ADVANCED (GAUZE/BANDAGES/DRESSINGS) ×2
DERMABOND ADVANCED .7 DNX12 (GAUZE/BANDAGES/DRESSINGS) IMPLANT
DRAIN CHANNEL 10F 3/8 F FF (DRAIN) IMPLANT
DRAPE INCISE IOBAN 66X45 STRL (DRAPES) ×3 IMPLANT
DRSG TEGADERM 4X4.75 (GAUZE/BANDAGES/DRESSINGS) IMPLANT
ELECT PENCIL ROCKER SW 15FT (MISCELLANEOUS) ×3 IMPLANT
ELECT REM PT RETURN 15FT ADLT (MISCELLANEOUS) ×3 IMPLANT
EVACUATOR SILICONE 100CC (DRAIN) IMPLANT
GAUZE SPONGE 4X4 12PLY STRL (GAUZE/BANDAGES/DRESSINGS) ×2 IMPLANT
GLOVE BIO SURGEON STRL SZ 6.5 (GLOVE) ×2 IMPLANT
GLOVE BIO SURGEONS STRL SZ 6.5 (GLOVE) ×1
GOWN STRL REUS W/TWL LRG LVL3 (GOWN DISPOSABLE) ×3 IMPLANT
HANDLE STAPLE  ENDO EGIA 4 STD (STAPLE) ×3
HANDLE STAPLE EGIA 4 XL (STAPLE) IMPLANT
HANDLE STAPLE ENDO EGIA 4 STD (STAPLE) IMPLANT
HEMOSTAT SURGICEL 4X8 (HEMOSTASIS) IMPLANT
IRRIG SUCT STRYKERFLOW 2 WTIP (MISCELLANEOUS) ×3
IRRIGATION SUCT STRKRFLW 2 WTP (MISCELLANEOUS) ×1 IMPLANT
KIT BASIN (CUSTOM PROCEDURE TRAY) ×3 IMPLANT
KIT TURNOVER KIT A (KITS) IMPLANT
LIGASURE VESSEL 5MM BLUNT TIP (ELECTROSURGICAL) ×2 IMPLANT
MARKER SKIN DUAL TIP RULER LAB (MISCELLANEOUS) ×3 IMPLANT
POUCH SPECIMEN RETRIEVAL 10MM (ENDOMECHANICALS) IMPLANT
RELOAD 45 VASCULAR/THIN (ENDOMECHANICALS) IMPLANT
RELOAD EGIA 45 TAN VASC (STAPLE) IMPLANT
RELOAD EGIA 60 TAN VASC (STAPLE) ×2 IMPLANT
RELOAD STAPLE 45 2.5 WHT GRN (ENDOMECHANICALS) IMPLANT
SCISSORS LAP 5X35 DISP (ENDOMECHANICALS) ×2 IMPLANT
SET TUBE SMOKE EVAC HIGH FLOW (TUBING) ×3 IMPLANT
SPONGE LAP 18X18 RF (DISPOSABLE) ×3 IMPLANT
STAPLER VISISTAT 35W (STAPLE) ×2 IMPLANT
SURGIFLO W/THROMBIN 8M KIT (HEMOSTASIS) ×2 IMPLANT
SUT ETHILON 3 0 PS 1 (SUTURE) IMPLANT
SUT MNCRL AB 4-0 PS2 18 (SUTURE) ×2 IMPLANT
SUT PDS AB 1 TP1 96 (SUTURE) ×10 IMPLANT
SUT SILK 0 (SUTURE) ×3
SUT SILK 0 30XBRD TIE 6 (SUTURE) IMPLANT
SUT VIC AB 0 CT1 18XCR BRD 8 (SUTURE) IMPLANT
SUT VIC AB 0 CT1 8-18 (SUTURE) ×3
SUT VIC AB 2-0 CT1 27 (SUTURE) ×6
SUT VIC AB 2-0 CT1 TAPERPNT 27 (SUTURE) IMPLANT
SUT VIC AB 2-0 SH 27 (SUTURE) ×3
SUT VIC AB 2-0 SH 27X BRD (SUTURE) ×1 IMPLANT
SUT VICRYL 0 UR6 27IN ABS (SUTURE) ×3 IMPLANT
SYS LAPSCP GELPORT 120MM (MISCELLANEOUS) ×3
SYSTEM LAPSCP GELPORT 120MM (MISCELLANEOUS) ×1 IMPLANT
TAPE PAPER 3X10 WHT MICROPORE (GAUZE/BANDAGES/DRESSINGS) ×2 IMPLANT
TAPE STRIPS DRAPE STRL (GAUZE/BANDAGES/DRESSINGS) ×3 IMPLANT
TOWEL OR 17X26 10 PK STRL BLUE (TOWEL DISPOSABLE) ×3 IMPLANT
TRAY FOLEY MTR SLVR 16FR STAT (SET/KITS/TRAYS/PACK) ×3 IMPLANT
TRAY LAPAROSCOPIC (CUSTOM PROCEDURE TRAY) ×3 IMPLANT
TROCAR BLADELESS OPT 5 100 (ENDOMECHANICALS) IMPLANT
TROCAR UNIVERSAL OPT 12M 100M (ENDOMECHANICALS) ×3 IMPLANT
TROCAR XCEL 12X100 BLDLESS (ENDOMECHANICALS) ×3 IMPLANT
TUBING CONNECTING 10 (TUBING) ×1 IMPLANT
TUBING CONNECTING 10' (TUBING) ×1
YANKAUER SUCT BULB TIP 10FT TU (MISCELLANEOUS) IMPLANT
YANKAUER SUCT BULB TIP NO VENT (SUCTIONS) ×2 IMPLANT

## 2019-07-31 NOTE — Transfer of Care (Signed)
Immediate Anesthesia Transfer of Care Note  Patient: [name]  Procedure(s) Performed: HAND ASSISTED LAPAROSCOPIC NEPHRECTOMY CONVERTED TO OPEN WITH REPAIR OF INFERIOR VENA CAVAOTOMY (Right )  Patient Location: PACU  Anesthesia Type:General  Level of Consciousness: awake and alert   Airway & Oxygen Therapy: Patient Spontanous Breathing and Patient connected to face mask oxygen  Post-op Assessment: Report given to RN, Post -op Vital signs reviewed and stable and Patient moving all extremities X 4  Post vital signs: Reviewed and stable  Last Vitals:  Vitals Value Taken Time  BP 135/67 07/31/19 1327  Temp    Pulse 66 07/31/19 1330  Resp 11 07/31/19 1330  SpO2 100 % 07/31/19 1330  Vitals shown include unvalidated device data.  Last Pain:  Vitals:   07/31/19 0807  TempSrc: Oral      Patients Stated Pain Goal: 4 ([id number])  Complications: No apparent anesthesia complications

## 2019-07-31 NOTE — Anesthesia Procedure Notes (Signed)
Procedure Name: Intubation Date/Time: 07/31/2019 9:12 AM Performed by: Niel Hummer, CRNA Pre-anesthesia Checklist: Patient identified, Emergency Drugs available, Suction available and Patient being monitored Patient Re-evaluated:Patient Re-evaluated prior to induction Oxygen Delivery Method: Circle system utilized Preoxygenation: Pre-oxygenation with 100% oxygen Induction Type: IV induction Laryngoscope Size: Glidescope and 4 Grade View: Grade I Tube type: Oral Tube size: 7.0 mm Number of attempts: 1 Airway Equipment and Method: Stylet and Video-laryngoscopy Placement Confirmation: ETT inserted through vocal cords under direct vision,  positive ETCO2 and breath sounds checked- equal and bilateral Secured at: 23 cm Tube secured with: Tape Dental Injury: Teeth and Oropharynx as per pre-operative assessment  Comments: Elective Glidescope used given history of difficult intubation. Grade 1 view. Successful tube placement

## 2019-07-31 NOTE — Anesthesia Preprocedure Evaluation (Addendum)
Anesthesia Evaluation  Patient identified by MRN, date of birth, ID band Patient awake    Reviewed: Allergy & Precautions, NPO status , Patient's Chart, lab work & pertinent test results, reviewed documented beta blocker date and time   History of Anesthesia Complications (+) PONV, DIFFICULT AIRWAY and history of anesthetic complications  Airway Mallampati: IV  TM Distance: <3 FB Neck ROM: Full    Dental  (+) Teeth Intact, Dental Advisory Given, Chipped,    Pulmonary sleep apnea ,    Pulmonary exam normal breath sounds clear to auscultation       Cardiovascular hypertension, Pt. on home beta blockers (-) angina+ CAD  (-) Past MI and (-) Cardiac Stents Normal cardiovascular exam+ dysrhythmias (PAT s/p ablation)  Rhythm:Regular Rate:Normal     Neuro/Psych  Headaches, PSYCHIATRIC DISORDERS Anxiety Depression Bipolar Disorder  Neuromuscular disease    GI/Hepatic Neg liver ROS, GERD  ,  Endo/Other  Hypothyroidism Obesity   Renal/GU Renal InsufficiencyRenal diseaseRIGHT RENAL MASS     Musculoskeletal negative musculoskeletal ROS (+)   Abdominal   Peds  (+) ATTENTION DEFICIT DISORDER WITHOUT HYPERACTIVITY Hematology  (+) Blood dyscrasia, anemia ,   Anesthesia Other Findings Day of surgery medications reviewed with the patient.  Reproductive/Obstetrics                            Anesthesia Physical Anesthesia Plan  ASA: III  Anesthesia Plan: General   Post-op Pain Management:    Induction: Intravenous  PONV Risk Score and Plan: 4 or greater and Dexamethasone, Ondansetron, Treatment may vary due to age or medical condition and Propofol infusion  Airway Management Planned: Oral ETT and Video Laryngoscope Planned  Additional Equipment:   Intra-op Plan:   Post-operative Plan: Extubation in OR  Informed Consent: I have reviewed the patients History and Physical, chart, labs and discussed  the procedure including the risks, benefits and alternatives for the proposed anesthesia with the patient or authorized representative who has indicated his/her understanding and acceptance.     Dental advisory given  Plan Discussed with: CRNA  Anesthesia Plan Comments: (2nd PIV after induction, possible A-line)       Anesthesia Quick Evaluation

## 2019-07-31 NOTE — Op Note (Addendum)
Operative Note  Preoperative diagnosis:  1. Right renal mass  Postoperative diagnosis: 1. Right renal mass  Procedure(s): 1. Right hand assisted laparoscopic radical nephrectomy converted to open  2. Repair of inferior vena cavotomy  Surgeon: Jacalyn Lefevre, MD  Assistants: Nicolette Bang, MD  Clemetine Marker, PA An assistant was needed throughout the case further expertise in assisting with a laparoscopic surgery, including visualization with the camera, passing instruments, etc.  Anesthesia: General  Complications: None  EBL: 425 cc  Specimens: 1. Right kidney  Drains/Catheters: 1. Foley catheter 2. Right upper quadrant JP drain  Intraoperative findings:  1. Kidney removed entirely 2. Inferior vena cavotomy repaired   Indication: 74 year old woman who was found on imaging to have a large right renal mass concerning for renal cell carcinoma.  After discussion of different options, the patient elected to undergo the above operation.  Description of procedure:  The patient was identified and consent was obtained.  The patient was taken to the operating room and placed in the supine position.  The patient was placed under general anesthesia.  Perioperative antibiotics were administered.  The patient was placed in right lateral position at approximately 65 degrees and all pressure points were padded.  Patient was prepped and draped in a standard sterile fashion and a timeout was performed.  An 8 cm periumbilical incision was made sharply into the skin.  This was carried down with Bovie electrocautery down to the anterior rectus sheath which was divided with electrocautery.  The underlying musculature was separated in the midline.  Sharp dissection with Metzenbaum scissors was used to open up the posterior sheath and peritoneum.  This was extended with electrocautery taking great care not to use cautery near the bowel.  The hand assist port was secured into the incision.  I made  sure no bowel was trapped within this.  A 12 mm port was inserted through the hand assist port and the abdomen was insufflated to a pressure of 15.  A 12 mm port was placed lateral as well as superior to the hand assist port, each 1 about a hand width away. A 5 mm port was placed superior to the midline port and used for liver retraction.  Please note that all ports were placed under direct visualization with the camera.  The colon was first dissected medially by incising along the white line of Toldt.  After medializing the colon, the kidney was dissected laterally and medially as well as superiorly.  Inferior attachments as well as the ureter was divided with LigaSure device.  The gonadal vein was clipped with a clip. I continued to carefully dissect medially and identified the renal hilum.  The renal vein and renal artery dissected out so that a stapler could be used to ligate these en bloc.  As dissection continued, a hole was noted in the inferior vena cava.  In order to carefully repair the vena cava and control the renal hilum, the decision was made to convert the case to open.  Gentle pressure was applied to the cavotomy site while the patient was opened.   A subcostal incision was made off of the superior port case.  Dissection continued through the fascia and into the peritoneum.  A bookwalter retractor was placed to help with exposure. Lateral and superior attachments of the kidney were sharply taken down.  A satinksy clamp was then placed around the right renal vein and artery.  The renal hilum was then sharply divided.  The renal artery was  suture ligated followed by the renal vein with 0 silk.  Next, the hole in the vena cava was oversewn with 6-0 prolene.  Inspection of renal hilum and vena cava revealed no active bleeding.     Superior attachments were then released using LigaSure device as well as blunt dissection.  Once the entire kidney and surrounding Gerota's fascia was freed, the  specimen was removed and passed off for permanent specimen.  The abdomen was reinspected and no active bleeding was noted. Surgiflow and surgicel were then applied to the renal hilum and nephrectomy bed. A drain was placed in the right upper quadrant.  The right subcostal incision was and midline incision fascia were closed with running 1-0 looped PDS suture followed by staples.  The port incisions were closed with a 2-0 Vicryl followed by staples.  Exparel was instilled for anesthetic effect.  A dressing was applied.  Patient tolerated the procedure well and was stable postoperatively.  Plan: Stat labs will be obtained.  The patient will remain on bed rest until tomorrow. Anticipate the patient will be in the hospital 2-3 nights as long as she does well.

## 2019-07-31 NOTE — Progress Notes (Signed)
Patient seen and examined on floor postoperatively.  She is conversant and appropriate.  Reports her eyes sting.  Moving all extremities.    VSS Postop Hgb 11.2 (same as preop)  Abd: soft, nd, appropriately ttp, JP with 25 cc ss output Foley: draining clear yellow urine  -bed rest tonight -minimal clears -home meds -pain meds -SCDs -AM labs

## 2019-07-31 NOTE — H&P (Signed)
cc: Right renal mass   07/10/19: 74 year old woman with history of a right renal mass diagnosed in 2011 and followed by Dr. Alen Blew referred by Dr.Ottelin for consideration of a right radical nephrectomy. Patient has a CT of the abdomen and pelvis from 06/20/2019 showing 30% increase right renal mass consistent with RCC now measuring 7.3 x 7.0 x 6.2 cm. Patient has previously refused surgery for this and has been on active surveillance. She denies any gross hematuria. Patient has a long history of depression that is refractory to treatment.     ALLERGIES: Compazine TABS Talwin NX TABS Toradol SOLN    MEDICATIONS: Bystolic  Cymbalta CPEP Oral  Depakote  Diovan CAPS Oral  Levothyroxine Sodium 75 MCG Oral Tablet Oral  LORazepam 1 MG Oral Tablet Oral  Requip 1 MG Oral Tablet Oral     GU PSH: Locm 300-399Mg /Ml Iodine,1Ml - 06/20/2019       PSH Notes: Exploratory Laparotomy, Exploratory Laparotomy, Nose Surgery, Thyroid Surgery Total Thyroidectomy, Skin Tag Removal   NON-GU PSH: Exploratory Laparotomy - 2011, 2011 Removal of Skin Tags - 2011 Remove Thyroid - 2011     GU PMH: Right renal neoplasm (Worsening), She is going to undergo a CT scan of the abdomen and pelvis as well as chest and then I will contact her with the results. If she is a suitable candidate for surgery she does want to proceed with a laparoscopic procedure and I would refer her to 1 of my partners who performs this form of surgery. - 06/05/2019, Right, She has a solid renal neoplasm that has been followed since 2011. We did discuss the fact that it appears to be likely that it is a form of renal cell carcinoma although it may be a papillary renal cell. It has grown slowly. We did discuss the fact that given time even indolent renal cell carcinomas can result in metastatic spread. After hearing all of the options she was quite adamant in the fact that she did not want to proceed with any form of intervention and that her wish  was for continued observation by Dr. Alen Blew., - 04/19/2018 Dysuria, She did not leave a urine specimen for me when she came in but I asked her to leave 1 as she was leaving and it did appear to have significant bacteriuria so it will be cultured and any infection treated according to sensitivities. - 04/19/2018 Renal cyst, Bilateral, Renal cysts, acquired, bilateral - 2011 Neoplasm of unspecified behavior of unspecified kidney, Right, Renal neoplasm - 2011      PMH Notes: Right renal mass: She was incidentally found to have a 3.7 cm right renal lesion on ultrasound in 10/11.  CT 4/06 - No mass.  CT w/ 10/11 - 4.6x4.4 cm. hypervascular mass of the Rt. kidney. No evidence of renal vein involvement, local extension or adenopathy.   Renal cysts: Bilat. simple cysts.   NON-GU PMH: Anxiety, Anxiety (Symptom) - 2014 Hyperthyroidism, Hyperthyroidism - 2014 Personal history of other diseases of the circulatory system, History of hypertension - 2014 Personal history of other diseases of the digestive system, History of esophageal reflux - 2014 Personal history of other diseases of the nervous system and sense organs, History of sleep apnea - 2014 Personal history of other mental and behavioral disorders, History of depression - 2014    FAMILY HISTORY: Cancer - Uncle Colon Cancer - Grandfather, Grandmother Death In The Family Father - Father Death In The Family Mother - Mother Family Health Status Number -  Runs In Family Heart Disease - Mother, Junious Dresser Syndrome - Father   SOCIAL HISTORY: Marital Status: Divorced Preferred Language: English; Ethnicity: Not Hispanic Or Latino; Race: White Current Smoking Status: Patient has never smoked.   Tobacco Use Assessment Completed: Used Tobacco in last 30 days? Has never drank.  Does not drink caffeine.     Notes: Alcohol Use, Tobacco Use, Marital History - Divorced, Occupation:, Caffeine Use   REVIEW OF SYSTEMS:    GU Review Female:    Patient reports get up at night to urinate, frequent urination, and leakage of urine. Patient denies burning /pain with urination, stream starts and stops, have to strain to urinate, being pregnant, trouble starting your stream, and hard to postpone urination.  Gastrointestinal (Upper):   Patient reports nausea. Patient denies vomiting and indigestion/ heartburn.  Gastrointestinal (Lower):   Patient reports constipation. Patient denies diarrhea.  Constitutional:   Patient reports fatigue. Patient denies fever, night sweats, and weight loss.  Skin:   Patient denies skin rash/ lesion and itching.  Eyes:   Patient denies blurred vision and double vision.  Ears/ Nose/ Throat:   Patient denies sore throat and sinus problems.  Hematologic/Lymphatic:   Patient denies swollen glands and easy bruising.  Cardiovascular:   Patient denies leg swelling and chest pains.  Respiratory:   Patient denies cough and shortness of breath.  Endocrine:   Patient denies excessive thirst.  Musculoskeletal:   Patient reports back pain. Patient denies joint pain.  Neurological:   Patient reports dizziness. Patient denies headaches.  Psychologic:   Patient denies depression and anxiety.   VITAL SIGNS:      07/10/2019 02:56 PM  Weight 210 lb / 95.25 kg  Height 64 in / 162.56 cm  BP 104/70 mmHg  Pulse 67 /min  BMI 36.0 kg/m   MULTI-SYSTEM PHYSICAL EXAMINATION:    Constitutional: Well-nourished. No physical deformities. Normally developed. Good grooming.  Neck: Neck symmetrical, not swollen. Normal tracheal position.  Respiratory: No labored breathing, no use of accessory muscles.   Cardiovascular: Normal temperature  Skin: No paleness, no jaundice, no cyanosis. No lesion, no ulcer, no rash.  Neurologic / Psychiatric: Oriented to time, oriented to place, oriented to person. No depression, no anxiety, no agitation.  Gastrointestinal: No mass, no tenderness, no rigidity, no prior surgical scars  Eyes: Normal  conjunctivae. Normal eyelids.  Ears, Nose, Mouth, and Throat: Left ear no scars, no lesions, no masses. Right ear no scars, no lesions, no masses. Nose no scars, no lesions, no masses. Normal hearing. Normal lips.  Musculoskeletal: Normal gait and station of head and neck.     Complexity of Data:  Records Review:   POC Tool  Urine Test Review:   Urinalysis  X-Ray Review: C.T. Chest/ Abd/Pelvis: Reviewed Films. Reviewed Report. Discussed With Patient.    Notes:                     06/05/2019: Creatinine 0.7, GFR 85   PROCEDURES:          Urinalysis w/Scope Dipstick Dipstick Cont'd Micro  Color: Amber Bilirubin: Neg mg/dL WBC/hpf: 0 - 5/hpf  Appearance: Cloudy Ketones: Neg mg/dL RBC/hpf: NS (Not Seen)  Specific Gravity: 1.025 Blood: Neg ery/uL Bacteria: Many (>50/hpf)  pH: 5.5 Protein: Trace mg/dL Cystals: NS (Not Seen)  Glucose: Neg mg/dL Urobilinogen: 1.0 mg/dL Casts: NS (Not Seen)    Nitrites: Neg Trichomonas: Not Present    Leukocyte Esterase: Neg leu/uL Mucous: Present  Epithelial Cells: 10 - 20/hpf      Yeast: NS (Not Seen)      Sperm: Not Present    ASSESSMENT:      ICD-10 Details  1 GU:   Right renal neoplasm - D49.511 Right, Chronic, Life Threatening - Most recent imaging shows increase in size of right renal mass that is likely consistent with renal cell carcinoma. I discussed proceeding with a right hand assisted laparoscopic radical right nephrectomy. I discussed the risks and benefits of the procedure including but not limited to bleeding, infection, worsening renal function including possible need for dialysis, damage to surrounding structures, need for future intervention and treatment, inability remove all of the mass, pain. We discussed the postoperative course. She was tearful during the encounter and does not feel like she has any support system in place. She has member [employer] and we agreed that she would contact someone tomorrow to see about a support  group or a companion. She has agreed to proceed with the surgery and will suspend the DNR in the perioperative period.  2   Renal cyst - N28.1 Chronic, Stable - Simple left renal cysts-no further workup needed

## 2019-07-31 NOTE — Anesthesia Postprocedure Evaluation (Signed)
Anesthesia Post Note  Patient: [name]  Procedure(s) Performed: HAND ASSISTED LAPAROSCOPIC NEPHRECTOMY CONVERTED TO OPEN WITH REPAIR OF INFERIOR VENA CAVAOTOMY (Right )     Patient location during evaluation: PACU Anesthesia Type: General Level of consciousness: awake and alert Pain management: pain level controlled Vital Signs Assessment: post-procedure vital signs reviewed and stable Respiratory status: spontaneous breathing, nonlabored ventilation and respiratory function stable Cardiovascular status: blood pressure returned to baseline and stable Postop Assessment: no apparent nausea or vomiting Anesthetic complications: no    Last Vitals:  Vitals:   07/31/19 1415 07/31/19 1430  BP: 128/69 132/75  Pulse: 60 61  Resp: 16 15  Temp:    SpO2: 100% 100%                  Audry Pili

## 2019-08-01 LAB — BASIC METABOLIC PANEL
Anion gap: 13 (ref 5–15)
BUN: 17 mg/dL (ref 8–23)
CO2: 23 mmol/L (ref 22–32)
Calcium: 8.6 mg/dL — ABNORMAL LOW (ref 8.9–10.3)
Chloride: 101 mmol/L (ref 98–111)
Creatinine, Ser: 1.14 mg/dL — ABNORMAL HIGH (ref 0.44–1.00)
GFR calc Af Amer: 55 mL/min — ABNORMAL LOW (ref >60)
GFR calc non Af Amer: 47 mL/min — ABNORMAL LOW (ref >60)
Glucose, Bld: 133 mg/dL — ABNORMAL HIGH (ref 70–99)
Potassium: 4.3 mmol/L (ref 3.5–5.1)
Sodium: 137 mmol/L (ref 135–145)

## 2019-08-01 LAB — HEMOGLOBIN AND HEMATOCRIT, BLOOD
HCT: 35.1 % — ABNORMAL LOW (ref 36.0–46.0)
Hemoglobin: 10.9 g/dL — ABNORMAL LOW (ref 12.0–15.0)

## 2019-08-01 NOTE — Progress Notes (Signed)
PT Cancellation Note  Patient Details Name: [name] MRN: [id number] DOB: 10-Jul-1945   Cancelled Treatment:    Reason Eval/Treat Not Completed: Patient not medically ready. Per urology note PT to start on 08/02/19 for ambulation. Will return tomorrow for evaluation.   Lelon Mast 08/01/2019, 1:15 PM

## 2019-08-01 NOTE — Progress Notes (Signed)
Urology Inpatient Progress Report  Renal mass [N28.89]  Procedure(s): HAND ASSISTED LAPAROSCOPIC NEPHRECTOMY CONVERTED TO OPEN WITH REPAIR OF INFERIOR VENA CAVAOTOMY  1 Day Post-Op   Intv/Subj: No acute events overnight.  Patient pain has been controlled.  She was on bed rest overnight.  She is sore this morning on her right side.    Active Problems:   Renal mass  Current Facility-Administered Medications  Medication Dose Route Frequency Provider Last Rate Last Admin  . acetaminophen (TYLENOL) tablet 650 mg  650 mg Oral Q4H PRN Dancy, Amanda, PA-C      . ceFAZolin (ANCEF) IVPB 1 g/50 mL premix  1 g Intravenous Q8H Dancy, Amanda, PA-C 100 mL/hr at 08/01/19 0828 1 g at 08/01/19 0828  . Chlorhexidine Gluconate Cloth 2 % PADS 6 each  6 each Topical Daily Robley Fries, MD   6 each at 08/01/19 [phone]  . dextrose 5 %-0.45 % sodium chloride infusion   Intravenous Continuous Debbrah Alar, PA-C 100 mL/hr at 07/31/19 2359 New Bag at 07/31/19 2359  . diphenhydrAMINE (BENADRYL) injection 12.5 mg  12.5 mg Intravenous Q6H PRN Dancy, Amanda, PA-C       Or  . diphenhydrAMINE (BENADRYL) 12.5 MG/5ML elixir 12.5 mg  12.5 mg Oral Q6H PRN Dancy, Amanda, PA-C      . docusate sodium (COLACE) capsule 100 mg  100 mg Oral BID Debbrah Alar, PA-C   100 mg at 08/01/19 0818  . ferrous sulfate tablet 325 mg  325 mg Oral Q breakfast Debbrah Alar, PA-C   325 mg at 08/01/19 0819  . HYDROmorphone (DILAUDID) injection 0.5-1 mg  0.5-1 mg Intravenous Q2H PRN Debbrah Alar, PA-C   1 mg at 08/01/19 0420  . levothyroxine (SYNTHROID) tablet 75 mcg  75 mcg Oral QAC breakfast Debbrah Alar, PA-C   75 mcg at 08/01/19 0421  . LORazepam (ATIVAN) tablet 0.5 mg  0.5 mg Oral TID PRN Debbrah Alar, PA-C   0.5 mg at 08/01/19 0857  . methylphenidate (RITALIN) tablet 5 mg  5 mg Oral BID WC Dancy, Amanda, PA-C   5 mg at 08/01/19 0819  . nebivolol (BYSTOLIC) tablet 2.5 mg  2.5 mg Oral Q1500 Jacalyn Lefevre D, MD      . ondansetron  (ZOFRAN) injection 4 mg  4 mg Intravenous Q4H PRN Debbrah Alar, PA-C   4 mg at 07/31/19 1856  . opium-belladonna (B&O) suppository 16.2-60mg   1 suppository Rectal Q6H PRN Dancy, Amanda, PA-C      . oxyCODONE (Oxy IR/ROXICODONE) immediate release tablet 5 mg  5 mg Oral Q4H PRN Dancy, Amanda, PA-C      . polyvinyl alcohol (LIQUIFILM TEARS) 1.4 % ophthalmic solution 1 drop  1 drop Both Eyes PRN Jacalyn Lefevre D, MD   1 drop at 07/31/19 1850  . rOPINIRole (REQUIP) tablet 0.5 mg  0.5 mg Oral QPC supper Debbrah Alar, PA-C   0.5 mg at 07/31/19 1653  . rOPINIRole (REQUIP) tablet 1 mg  1 mg Oral QHS Jacalyn Lefevre D, MD   1 mg at 07/31/19 2110  . rosuvastatin (CRESTOR) tablet 5 mg  5 mg Oral Daily Debbrah Alar, PA-C   5 mg at 08/01/19 0819     Objective: Vital: Vitals:   07/31/19 2138 08/01/19 0207 08/01/19 0547 08/01/19 0935  BP: 129/66 114/66 108/60 112/62  Pulse: (!) 56 65 68 72  Resp: 18 17 18 18   Temp: 97.6 F (36.4 C) 97.8 F (36.6 C) 98.2 F (36.8 C) 97.9 F (36.6 C)  TempSrc: Oral Oral Oral Oral  SpO2: 100% 100% 100% 100%  Weight:      Height:       I/Os: UOP: 700 overnight JP: scant SS output in drain on rounds, has not been recorded Blood documented in chart: 425 mL is EBL from surgery  Physical Exam:  General: Patient is in no apparent distress Lungs: Normal respiratory effort, chest expands symmetrically. GI:  The abdomen is soft, nd, appropriately ttp; incision dressing c/d Foley: draining clear yellow urine Ext: lower extremities symmetric, SCDs on and machine turned on  Lab Results: Recent Labs    07/31/19 1349 08/01/19 0422  HGB 11.2* 10.9*  HCT 35.8* 35.1*   Recent Labs    08/01/19 0422  NA 137  K 4.3  CL 101  CO2 23  GLUCOSE 133*  BUN 17  CREATININE 1.14*  CALCIUM 8.6*   No results for input(s): LABPT, INR in the last 72 hours. No results for input(s): LABURIN in the last 72 hours. Results for orders placed or performed during the hospital  encounter of 07/31/19  SARS CORONAVIRUS 2 (TAT 6-24 HRS)     Status: None   Collection Time: 07/27/19  1:24 PM  Result Value Ref Range Status   SARS Coronavirus 2 NEGATIVE NEGATIVE Final    Comment: (NOTE) SARS-CoV-2 target nucleic acids are NOT DETECTED. The SARS-CoV-2 RNA is generally detectable in upper and lower respiratory specimens during the acute phase of infection. Negative results do not preclude SARS-CoV-2 infection, do not rule out co-infections with other pathogens, and should not be used as the sole basis for treatment or other patient management decisions. Negative results must be combined with clinical observations, patient history, and epidemiological information. The expected result is Negative. Fact Sheet for Patients: SugarRoll.be Fact Sheet for Healthcare Providers: https://www.woods-mathews.com/ This test is not yet approved or cleared by the [place] FDA and  has been authorized for detection and/or diagnosis of SARS-CoV-2 by FDA under an Emergency Use Authorization (EUA). This EUA will remain  in effect (meaning this test can be used) for the duration of the COVID-19 declaration under Section 56 4(b)(1) of the Act, 21 U.S.C. section 360bbb-3(b)(1), unless the authorization is terminated or revoked sooner. Performed at [facility], [place] [address], [place], [place] [address]      Assessment: 74 yo woman with large right renal mass POD1 s/p right hand-assisted laparoscopic converted to open nephrectomy with repair of inferior vena cavotomy.  Hgb stable.    Plan: -OOB to chair today -clear liquids as tolerated -pain meds PRN -SCDS on and functioning at all times while in the bed -home meds -PT consult for tomorrow -AM labs   Jacalyn Lefevre, MD Urology 08/01/2019, 10:11 AM

## 2019-08-02 LAB — HEMOGLOBIN AND HEMATOCRIT, BLOOD
HCT: 29.3 % — ABNORMAL LOW (ref 36.0–46.0)
HCT: 28.4 % — ABNORMAL LOW (ref 36.0–46.0)
Hemoglobin: 8.9 g/dL — ABNORMAL LOW (ref 12.0–15.0)
Hemoglobin: 8.8 g/dL — ABNORMAL LOW (ref 12.0–15.0)

## 2019-08-02 LAB — BASIC METABOLIC PANEL
Anion gap: 9 (ref 5–15)
BUN: 10 mg/dL (ref 8–23)
CO2: 24 mmol/L (ref 22–32)
Calcium: 8.1 mg/dL — ABNORMAL LOW (ref 8.9–10.3)
Chloride: 99 mmol/L (ref 98–111)
Creatinine, Ser: 1.12 mg/dL — ABNORMAL HIGH (ref 0.44–1.00)
GFR calc Af Amer: 56 mL/min — ABNORMAL LOW (ref >60)
GFR calc non Af Amer: 48 mL/min — ABNORMAL LOW (ref >60)
Glucose, Bld: 126 mg/dL — ABNORMAL HIGH (ref 70–99)
Potassium: 4 mmol/L (ref 3.5–5.1)
Sodium: 132 mmol/L — ABNORMAL LOW (ref 135–145)

## 2019-08-02 LAB — SURGICAL PATHOLOGY

## 2019-08-02 MED ORDER — CALCIUM GLUCONATE-NACL 1-0.675 GM/50ML-% IV SOLN
1.0000 g | Freq: Once | INTRAVENOUS | Status: AC
  Administered 2019-08-02: 1000 mg via INTRAVENOUS
  Filled 2019-08-02: qty 50

## 2019-08-02 NOTE — Evaluation (Signed)
Physical Therapy Evaluation Patient Details Name: [name] MRN: [id number] DOB: 1945-03-30 Today's Date: 08/02/2019   History of Present Illness  74 yo woman with large right renal mass s/p right hand-assisted laparoscopic converted to open nephrectomy with repair of inferior vena cavotomy  Clinical Impression  Pt admitted with above diagnosis.  Pt currently with functional limitations due to the deficits listed below (see PT Problem List). Pt will benefit from skilled PT to increase their independence and safety with mobility to allow discharge to the venue listed below.  Pt very slow to mobilize.  Pt required some encouragement to ambulate short distance and remain upright in recliner.  Pt reports living with a friend however will have no one to assist upon d/c.  Recommend SNF at this time however will continue to update d/c recommendations as pt progresses.     Follow Up Recommendations SNF;Supervision/Assistance - 24 hour    Equipment Recommendations  Rolling walker with 5" wheels    Recommendations for Other Services       Precautions / Restrictions Precautions Precautions: Fall Precaution Comments: abdominal incision, Right JP drain Restrictions Weight Bearing Restrictions: No      Mobility  Bed Mobility Overal bed mobility: Needs Assistance Bed Mobility: Sidelying to Sit   Sidelying to sit: Min assist;HOB elevated       General bed mobility comments: slight assist for trunk, pt utilized rolling and rail  Transfers Overall transfer level: Needs assistance Equipment used: Rolling walker (2 wheeled) Transfers: Sit to/from Stand Sit to Stand: Min assist         General transfer comment: assist to rise and steady, cues for technique  Ambulation/Gait Ambulation/Gait assistance: Min guard Gait Distance (Feet): 16 Feet Assistive device: Rolling walker (2 wheeled) Gait Pattern/deviations: Step-through pattern;Decreased stride length Gait velocity:  decreased   General Gait Details: very slow, frequent cues for continuing and RW positioning, pt with closing eyes and groggy appearance during ambulation, pt reports limited by pain and dizziness  Stairs            Wheelchair Mobility    Modified Rankin (Stroke Patients Only)       Balance                                             Pertinent Vitals/Pain Pain Assessment: Faces Faces Pain Scale: Hurts little more Pain Location: pt reports abdominal pain however appears groggy and closing eyes/sleepy during ambulation Pain Descriptors / Indicators: Sore;Tender Pain Intervention(s): Monitored during session;Repositioned;Premedicated before session    Home Living Family/patient expects to be discharged to:: Private residence Living Arrangements: Non-relatives/Friends   Type of Home: House Home Access: Level entry     Home Layout: One level Home Equipment: None      Prior Function Level of Independence: Independent               Hand Dominance        Extremity/Trunk Assessment        Lower Extremity Assessment Lower Extremity Assessment: Generalized weakness       Communication   Communication: No difficulties  Cognition Arousal/Alertness: Suspect due to medications(groggy) Behavior During Therapy: Flat affect Overall Cognitive Status: Within Functional Limits for tasks assessed  General Comments      Exercises     Assessment/Plan    PT Assessment Patient needs continued PT services  PT Problem List Decreased strength;Decreased activity tolerance;Decreased balance;Decreased knowledge of use of DME;Decreased mobility;Pain       PT Treatment Interventions DME instruction;Therapeutic activities;Gait training;Patient/family education;Therapeutic exercise;Functional mobility training;Balance training    PT Goals (Current goals can be found in the Care Plan section)   Acute Rehab PT Goals PT Goal Formulation: With patient Time For Goal Achievement: 08/16/19 Potential to Achieve Goals: Good    Frequency Min 3X/week   Barriers to discharge        Co-evaluation               AM-PAC PT "6 Clicks" Mobility  Outcome Measure Help needed turning from your back to your side while in a flat bed without using bedrails?: A Little Help needed moving from lying on your back to sitting on the side of a flat bed without using bedrails?: A Little Help needed moving to and from a bed to a chair (including a wheelchair)?: A Little Help needed standing up from a chair using your arms (e.g., wheelchair or bedside chair)?: A Little Help needed to walk in hospital room?: A Little Help needed climbing 3-5 steps with a railing? : A Lot 6 Click Score: 17    End of Session Equipment Utilized During Treatment: Gait belt Activity Tolerance: Other (comment)(pt appears self limiting however was agreeable to try) Patient left: in chair;with call bell/phone within reach Nurse Communication: Mobility status PT Visit Diagnosis: Other abnormalities of gait and mobility (R26.89)    Time: [id number] PT Time Calculation (min) (ACUTE ONLY): 26 min   Charges:   PT Evaluation $PT Eval Low Complexity: 1 Low PT Treatments $Gait Training: 8-22 mins       Arlyce Dice, DPT Acute Rehabilitation Services Office: [phone]   York Ram E 08/02/2019, 12:12 PM

## 2019-08-02 NOTE — Progress Notes (Signed)
Urology Inpatient Progress Report  Renal mass [N28.89]  Procedure(s): HAND ASSISTED LAPAROSCOPIC NEPHRECTOMY CONVERTED TO OPEN WITH REPAIR OF INFERIOR VENA CAVAOTOMY  2 Days Post-Op   Intv/Subj: No acute events overnight.  Patient is without complaint.  No flatus.  No OOB.   Active Problems:   Renal mass  Current Facility-Administered Medications  Medication Dose Route Frequency Provider Last Rate Last Admin  . acetaminophen (TYLENOL) tablet 650 mg  650 mg Oral Q4H PRN Dancy, Amanda, PA-C      . ceFAZolin (ANCEF) IVPB 1 g/50 mL premix  1 g Intravenous Q8H Dancy, Amanda, PA-C 100 mL/hr at 08/02/19 0124 1 g at 08/02/19 0124  . Chlorhexidine Gluconate Cloth 2 % PADS 6 each  6 each Topical Daily Robley Fries, MD   6 each at 08/01/19 [phone]  . dextrose 5 %-0.45 % sodium chloride infusion   Intravenous Continuous Debbrah Alar, PA-C 100 mL/hr at 08/01/19 2324 New Bag at 08/01/19 2324  . diphenhydrAMINE (BENADRYL) injection 12.5 mg  12.5 mg Intravenous Q6H PRN Dancy, Amanda, PA-C       Or  . diphenhydrAMINE (BENADRYL) 12.5 MG/5ML elixir 12.5 mg  12.5 mg Oral Q6H PRN Dancy, Amanda, PA-C      . docusate sodium (COLACE) capsule 100 mg  100 mg Oral BID Debbrah Alar, PA-C   100 mg at 08/01/19 2226  . ferrous sulfate tablet 325 mg  325 mg Oral Q breakfast Debbrah Alar, PA-C   325 mg at 08/01/19 0819  . HYDROmorphone (DILAUDID) injection 0.5-1 mg  0.5-1 mg Intravenous Q2H PRN Debbrah Alar, PA-C   1 mg at 08/02/19 0449  . levothyroxine (SYNTHROID) tablet 75 mcg  75 mcg Oral QAC breakfast Debbrah Alar, PA-C   75 mcg at 08/02/19 0525  . LORazepam (ATIVAN) tablet 0.5 mg  0.5 mg Oral TID PRN Debbrah Alar, PA-C   0.5 mg at 08/01/19 1638  . methylphenidate (RITALIN) tablet 5 mg  5 mg Oral BID WC Dancy, Amanda, PA-C   5 mg at 08/01/19 0819  . nebivolol (BYSTOLIC) tablet 2.5 mg  2.5 mg Oral Q1500 Jacalyn Lefevre D, MD      . ondansetron (ZOFRAN) injection 4 mg  4 mg Intravenous Q4H PRN Debbrah Alar, PA-C   4 mg at 07/31/19 1856  . opium-belladonna (B&O) suppository 16.2-60mg   1 suppository Rectal Q6H PRN Debbrah Alar, PA-C      . oxyCODONE (Oxy IR/ROXICODONE) immediate release tablet 5 mg  5 mg Oral Q4H PRN Debbrah Alar, PA-C   5 mg at 08/01/19 1052  . polyvinyl alcohol (LIQUIFILM TEARS) 1.4 % ophthalmic solution 1 drop  1 drop Both Eyes PRN Jacalyn Lefevre D, MD   1 drop at 07/31/19 1850  . rOPINIRole (REQUIP) tablet 0.5 mg  0.5 mg Oral QPC supper Debbrah Alar, PA-C   0.5 mg at 08/01/19 1716  . rOPINIRole (REQUIP) tablet 1 mg  1 mg Oral QHS Jacalyn Lefevre D, MD   1 mg at 08/01/19 2226  . rosuvastatin (CRESTOR) tablet 5 mg  5 mg Oral Daily Debbrah Alar, PA-C   5 mg at 08/01/19 0819     Objective: Vital: Vitals:   08/01/19 0935 08/01/19 1357 08/01/19 2049 08/02/19 0551  BP: 112/62 119/64 121/70 110/64  Pulse: 72 69 89 84  Resp: 18 18 18 16   Temp: 97.9 F (36.6 C) 97.9 F (36.6 C) 98.3 F (36.8 C) 100.2 F (37.9 C)  TempSrc: Oral Oral Oral Oral  SpO2: 100% 99% 99% 99%  Weight:      Height:       I/Os: I/O last 3 completed shifts: In: 4004.1 [P.O.:510; I.V.:3149.1; Other:45; IV Piggyback:300] Out: 2410 [Urine:2380; Drains:30]  Physical Exam:  General: Patient is in no apparent distress Lungs: Normal respiratory effort, chest expands symmetrically. GI: Incisions are c/d/i with staples and mild ecchymosis; abd soft and nontender, ND JP drain with serosanguinous drainage Foley: draining clear yellow urine Ext: lower extremities symmetric  Lab Results: Recent Labs    07/31/19 1349 08/01/19 0422 08/02/19 0437  HGB 11.2* 10.9* 8.9*  HCT 35.8* 35.1* 29.3*   Recent Labs    08/01/19 0422 08/02/19 0437  NA 137 132*  K 4.3 4.0  CL 101 99  CO2 23 24  GLUCOSE 133* 126*  BUN 17 10  CREATININE 1.14* 1.12*  CALCIUM 8.6* 8.1*   No results for input(s): LABPT, INR in the last 72 hours. No results for input(s): LABURIN in the last 72 hours. Results for  orders placed or performed during the hospital encounter of 07/31/19  SARS CORONAVIRUS 2 (TAT 6-24 HRS)     Status: None   Collection Time: 07/27/19  1:24 PM  Result Value Ref Range Status   SARS Coronavirus 2 NEGATIVE NEGATIVE Final    Comment: (NOTE) SARS-CoV-2 target nucleic acids are NOT DETECTED. The SARS-CoV-2 RNA is generally detectable in upper and lower respiratory specimens during the acute phase of infection. Negative results do not preclude SARS-CoV-2 infection, do not rule out co-infections with other pathogens, and should not be used as the sole basis for treatment or other patient management decisions. Negative results must be combined with clinical observations, patient history, and epidemiological information. The expected result is Negative. Fact Sheet for Patients: SugarRoll.be Fact Sheet for Healthcare Providers: https://www.woods-mathews.com/ This test is not yet approved or cleared by the [place] FDA and  has been authorized for detection and/or diagnosis of SARS-CoV-2 by FDA under an Emergency Use Authorization (EUA). This EUA will remain  in effect (meaning this test can be used) for the duration of the COVID-19 declaration under Section 56 4(b)(1) of the Act, 21 U.S.C. section 360bbb-3(b)(1), unless the authorization is terminated or revoked sooner. Performed at [facility], [place] [address], [place], [place] [address]      Assessment: 74 year old woman with right renal mass POD2 s/p hand-assist lap converted to open right radical nephrectomy. Creatinine stable.  Hgb decreased this AM but VSS.    Plan: -continue clear liquid diet until flatus -SCDs at all times while in bed -recheck pm Hgb -start lovenox tomorrow -home meds -pain meds -bowel regimen -d/c foley today -PT evaluation today (discussed with patient she needs to be OOB today; recommended to chair yesterday but patient did not get out of  bed)   Jacalyn Lefevre, MD Urology 08/02/2019, 7:44 AM

## 2019-08-02 NOTE — Progress Notes (Addendum)
Chaplain providing support in response to spiritual care consult.    Provided support around recent surgery, changes in health, anxiety and pain, spiritual support.  This chaplain familiar with [name] from admissions in system.   Support:   [name] had been attending groups at [facility] as well as the mental health [place].  Her [place] groups have been on hiatus due to [place].  [name] describes her [place] group as helpful and faith figures prominently in her coping.  [name] had also been connected with therapist, Anderson Malta, at [facility], but has not reconnected with a therapist since Octavia left the practice.    [name] lives with fiance, a relationship she describes as "physically and emotionally unsupportive."  She has one daughter with whom she has not been connected for 5 years.  [name] has a Geographical information systems officer (36), who was adopted as a Sport and exercise psychologist and lives in [place].  Granddaughter - named [name] at birth,  renamed [name] - is getting married in June. She has been connecting with her [place] daughter and is hopeful to see wedding.  [name] has a best friend and friend's two sons - which she refers to as her "nephews"  Nephews live in [place] and are ministers.    CHaplain provided spiritual support, empathic pastoral presence and prayers at bedside and engaged [name] in connecting with support and coping resources related to anxiety.

## 2019-08-03 LAB — BASIC METABOLIC PANEL
Anion gap: 6 (ref 5–15)
BUN: 8 mg/dL (ref 8–23)
CO2: 26 mmol/L (ref 22–32)
Calcium: 8.1 mg/dL — ABNORMAL LOW (ref 8.9–10.3)
Chloride: 100 mmol/L (ref 98–111)
Creatinine, Ser: 1.03 mg/dL — ABNORMAL HIGH (ref 0.44–1.00)
GFR calc Af Amer: >60 mL/min (ref >60)
GFR calc non Af Amer: 54 mL/min — ABNORMAL LOW (ref >60)
Glucose, Bld: 134 mg/dL — ABNORMAL HIGH (ref 70–99)
Potassium: 3.6 mmol/L (ref 3.5–5.1)
Sodium: 132 mmol/L — ABNORMAL LOW (ref 135–145)

## 2019-08-03 LAB — SARS CORONAVIRUS 2 (TAT 6-24 HRS): SARS Coronavirus 2: NEGATIVE

## 2019-08-03 LAB — HEMOGLOBIN AND HEMATOCRIT, BLOOD
HCT: 25.6 % — ABNORMAL LOW (ref 36.0–46.0)
Hemoglobin: 8 g/dL — ABNORMAL LOW (ref 12.0–15.0)

## 2019-08-03 MED ORDER — CALCIUM GLUCONATE-NACL 2-0.675 GM/100ML-% IV SOLN
2.0000 g | Freq: Once | INTRAVENOUS | Status: AC
  Administered 2019-08-03: 2000 mg via INTRAVENOUS
  Filled 2019-08-03: qty 100

## 2019-08-03 MED ORDER — DOCUSATE SODIUM 100 MG PO CAPS: 100.0000 mg | ORAL_CAPSULE | Freq: Two times a day (BID) | ORAL | 0 refills | Status: DC

## 2019-08-03 MED ORDER — POTASSIUM CHLORIDE 10 MEQ/100ML IV SOLN
10.0000 meq | INTRAVENOUS | Status: AC
  Administered 2019-08-03 (×2): 10 meq via INTRAVENOUS
  Filled 2019-08-03 (×2): qty 100

## 2019-08-03 MED ORDER — HYDROMORPHONE HCL 2 MG PO TABS: 2.0000 mg | ORAL_TABLET | Freq: Four times a day (QID) | ORAL | 0 refills | Status: DC | PRN

## 2019-08-03 MED ORDER — HYDROMORPHONE HCL 2 MG PO TABS
2.0000 mg | ORAL_TABLET | ORAL | Status: DC | PRN
  Administered 2019-08-04 – 2019-08-05 (×3): 2 mg via ORAL
  Filled 2019-08-03 (×4): qty 1

## 2019-08-03 NOTE — Progress Notes (Addendum)
In our conversation today, [name] noted that she has a Do Not Resuscitate order.  She remembers this as a yellow form that she brought into hospital with her.   She notes she filled this out with Dr Alen Blew at [facility] prior to this admission.    This document was found in her paper chart on the unit.   Her file EMR currently shows her as Full code with no ACP documents on file.   This chaplain completed request in ACP to upload document.

## 2019-08-03 NOTE — Care Management Important Message (Signed)
Important Message  Patient Details IM Letter given to [facility] Case Manager to present to the Patient Name: SHAWDA CHANNING MRN: [id number] Date of Birth: 02-Apr-1945   Medicare Important Message Given:  Yes     Kerin Salen 08/03/2019, 11:27 AM

## 2019-08-03 NOTE — Progress Notes (Addendum)
Follow up with [name] for continued spiritual support around move to rehab.   In course of conversation, [name] states:  "I have not said this in a long time, but I don't want to go on living."  She goes on to say "I have lost everything..."   Notes: "prior to this hospitalization, I was looking forward to what God had for me...  I wanted to care for others."   She expresses discouragement around rehab and disconnection.  She requests prayers with chaplain.  Chaplain encourages [name] sharing these feelings with other providers.  I note from Chart review that she had extended contact with [facility] provider Dr [name] - as recently as 06/14/19

## 2019-08-03 NOTE — Progress Notes (Signed)
Contacted by case manager Kathrin Greathouse.  Patient needs psychiatric evaluation prior to discharge to SNF.  She has also endorsed feeling as if she doesn't care if she lives or dies to chaplain.  She denies suicidal ideation when specifically asked.  At this point, no sitter is needed.  RN also confirmed patient denies SI.    Have paged psych and will await recommendations.

## 2019-08-03 NOTE — Progress Notes (Signed)
Urology Inpatient Progress Report   Intv/Subj: Patient worked with PT yesterday.  +Flatus yesterday and today.  Patient remains anxious - had chaplain consult yesterday.   Active Problems:   Renal mass  Current Facility-Administered Medications  Medication Dose Route Frequency Provider Last Rate Last Admin  . acetaminophen (TYLENOL) tablet 650 mg  650 mg Oral Q4H PRN [name]      . calcium gluconate 2 g/ 100 mL sodium chloride IVPB  2 g Intravenous Once [name]      . ceFAZolin (ANCEF) IVPB 1 g/50 mL premix  1 g Intravenous Q8H [name] 100 mL/hr at 08/02/19 2358 1 g at 08/02/19 2358  . Chlorhexidine Gluconate Cloth 2 % PADS 6 each  6 each Topical Daily [name]   6 each at 08/02/19 1459  . dextrose 5 %-0.45 % sodium chloride infusion   Intravenous Continuous [name] 100 mL/hr at 08/03/19 0542 New Bag at 08/03/19 0542  . diphenhydrAMINE (BENADRYL) injection 12.5 mg  12.5 mg Intravenous Q6H PRN [name]       Or  . diphenhydrAMINE (BENADRYL) 12.5 MG/5ML elixir 12.5 mg  12.5 mg Oral Q6H PRN [name]      . docusate sodium (COLACE) capsule 100 mg  100 mg Oral BID [name]   100 mg at 08/02/19 2120  . ferrous sulfate tablet 325 mg  325 mg Oral Q breakfast [name]   325 mg at 08/02/19 0819  . HYDROmorphone (DILAUDID) injection 0.5-1 mg  0.5-1 mg Intravenous Q2H PRN [name]   1 mg at 08/02/19 2119  . levothyroxine (SYNTHROID) tablet 75 mcg  75 mcg Oral QAC breakfast [name]   75 mcg at 08/03/19 0541  . LORazepam (ATIVAN) tablet 0.5 mg  0.5 mg Oral TID PRN [name]   0.5 mg at 08/02/19 2358  . methylphenidate (RITALIN) tablet 5 mg  5 mg Oral BID WC [name]   5 mg at 08/01/19 0819  . nebivolol (BYSTOLIC) tablet 2.5 mg  2.5 mg Oral Q1500 [name]   2.5 mg at 08/02/19 1035  . ondansetron (ZOFRAN) injection 4 mg  4 mg Intravenous Q4H  PRN [name]   4 mg at 08/02/19 1604  . opium-belladonna (B&O) suppository 16.2-60mg   1 suppository Rectal Q6H PRN [name]      . oxyCODONE (Oxy IR/ROXICODONE) immediate release tablet 5 mg  5 mg Oral Q4H PRN [name]   5 mg at 08/03/19 0541  . polyvinyl alcohol (LIQUIFILM TEARS) 1.4 % ophthalmic solution 1 drop  1 drop Both Eyes PRN [name]   1 drop at 07/31/19 1850  . potassium chloride 10 mEq in 100 mL IVPB  10 mEq Intravenous Q1 Hr x 2 [name] Frank, Frank      . rOPINIRole (REQUIP) tablet 0.5 mg  0.5 mg Oral QPC supper [name]   0.5 mg at 08/02/19 1706  . rOPINIRole (REQUIP) tablet 1 mg  1 mg Oral QHS [name]   1 mg at 08/02/19 2120  . rosuvastatin (CRESTOR) tablet 5 mg  5 mg Oral Daily [name]   5 mg at 08/01/19 0819     Objective: Vital: Vitals:   08/02/19 0551 08/02/19 1316 08/02/19 2106 08/03/19 0635  BP: 110/64 120/70 119/66 (!) 115/57  Pulse: 84 74 78 71  Resp: 16 16 17 18   Temp: 100.2 F (37.9 C) 99.2 F (37.3 C) 98.7 F (37.1 C) 99 F (37.2 C)  TempSrc: Oral Oral  Oral  SpO2: 99% 100% 100% 98%  Weight:      Height:       I/Os: I/O last 3 completed shifts: In: 4572.7 [P.O.:720; I.V.:3602.7; IV Piggyback:250] Out: 2943 [Urine:2900; Drains:43]  Physical Exam:  General: Patient is in no apparent distress Lungs: Normal respiratory effort, chest expands symmetrically. GI: Incisions are c/Frank/i with staples and surrounding ecchymosis.  The abdomen is soft and nontender; nondistended JP drain with minimal serosanguinous drainage Foley: removed yesterday Ext: lower extremities symmetric, SCDs on an functioning  Lab Results: Recent Labs    08/02/19 0437 08/02/19 1406 08/03/19 0456  HGB 8.9* 8.8* 8.0*  HCT 29.3* 28.4* 25.6*   Recent Labs    08/01/19 0422 08/02/19 0437 08/03/19 0456  NA 137 132* 132*  K 4.3 4.0 3.6  CL 101 99 100  CO2 23 24 26   GLUCOSE 133* 126* 134*  BUN  17 10 8   CREATININE 1.14* 1.12* 1.03*  CALCIUM 8.6* 8.1* 8.1*   No results for input(s): LABPT, INR in the last 72 hours. No results for input(s): LABURIN in the last 72 hours. Results for orders placed or performed during the hospital encounter of 07/31/19  SARS CORONAVIRUS 2 (TAT 6-24 HRS)     Status: None   Collection Time: 07/27/19  1:24 PM  Result Value Ref Range Status   SARS Coronavirus 2 NEGATIVE NEGATIVE Final    Comment: (NOTE) SARS-CoV-2 target nucleic acids are NOT DETECTED. The SARS-CoV-2 RNA is generally detectable in upper and lower respiratory specimens during the acute phase of infection. Negative results do not preclude SARS-CoV-2 infection, do not rule out co-infections with other pathogens, and should not be used as the sole basis for treatment or other patient management decisions. Negative results must be combined with clinical observations, patient history, and epidemiological information. The expected result is Negative. Fact Sheet for Patients: SugarRoll.be Fact Sheet for Healthcare Providers: https://www.woods-mathews.com/ This test is not yet approved or cleared by the [place] FDA and  has been authorized for detection and/or diagnosis of SARS-CoV-2 by FDA under an Emergency Use Authorization (EUA). This EUA will remain  in effect (meaning this test can be used) for the duration of the COVID-19 declaration under Section 56 4(b)(1) of the Act, 21 U.S.C. section 360bbb-3(b)(1), unless the authorization is terminated or revoked sooner. Performed at [facility], [place] [address], [place], [place] [address]      Assessment: 74 yo woman with right renal mass POD3 s/p right hand-assisted laparoscopic converted to open radical nephrectomy with repair of inferior vena cavotomy.  Creatinine stable and now with return of bowel function.  Following hemogblobin which is trending down however vital signs are  stable.  Plan: -advance to general diet today, decrease IVF -replace K and Ca -DVT ppx: SCDs and OOB; hold lovenox with Hgb trending down -check hgb tomorrow AM unless becomes HD unstable prior; Frank/c drain if Hgb stable and minimal output -pt with baseline anxiety on ativan and psych meds; typically anxious/tearful during encounter -continue PT; PT has recommended SNF; contacted case manager Elmyra Ricks [phone] today to begin working on this (pre-SNF COVID test has been ordered); anticipated Frank/c Sunday -change oral oxycodone to oral dilaudid to see if this helps with pain control although she is resting comfortably in bed  LOS: 3 days  Brooke Lefevre, M  Urology 08/03/2019, 7:20 AM

## 2019-08-03 NOTE — TOC Progression Note (Signed)
Transition of Care [facility]) - Progression Note    Patient Details  Name: [name] MRN: [id number] Date of Birth: [birth year]  Transition of Care [facility]) CM/SW [place], LCSW Phone Number: 08/03/2019, 3:28 PM  Clinical Narrative:    PASRR screener Renita Papa  called to complete a screening for the patient. She asked about patient mood, concerns about mental health since admission.  Per nurse, patient anxious about SNF but overall calm.   Per Chaplain note, the patient express, " I have not said this in a long time, but I don't want to go on living."....  Nurse inquired if the patient was actively suicidal, patient responded,  No. I just do not care if I live or die.  Physician notified. Physician to get Psych evaluation.   Psych evaluation/PT/OT notes will need to be submitted to [facility]. TOC staff will coordinate.   TOC staff will continue to follow for discharge needs.    Expected Discharge Plan: [place]    Expected Discharge Plan and Services Expected Discharge Plan: [place] In-house Referral: NA Discharge Planning Services: NA Post Acute Care Choice: [place] Living arrangements for the past 2 months: Single Family Home                 DME Arranged: N/A DME Agency: NA       HH Arranged: NA HH Agency: NA         Social Determinants of Health (SDOH) Interventions    Readmission Risk Interventions No flowsheet data found.

## 2019-08-03 NOTE — TOC Initial Note (Signed)
Transition of Care [facility]) - Initial/Assessment Note    Patient Details  Name: [name] MRN: [id number] Date of Birth: 07-09-1945  Transition of Care [facility]) CM/SW Contact:    Lia Hopping, [place] Phone Number: 08/03/2019, 12:22 PM  Clinical Narrative:    74 yo woman with right renal mass POD3 s/p right hand-assisted laparoscopic converted to open radical nephrectomy with repair of inferior vena cavotomy.              CSW met with the patient at bedside to discuss discharge planning to SNF for rehab. Patient agreeable to short rehab at [facility]. Patient walks independently at baseline and is independent with her ADL's, and still drives. Patient wants to regain her strength and be able to socialize and meet new people. CSW explain the SNF process and will follow up with bed offers. Patient is hopeful to discharge soon.   FL2 Completed.  PASRR level II.  Insurance ref.# [id number], Josem Kaufmann. Is still pending.   Expected Discharge Plan: [place]     Patient Goals and CMS Choice Patient states their goals for this hospitalization and ongoing recovery are:: "I want to get stronger and better so that I can continue to make friends." CMS Medicare.gov Compare Post Acute Care list provided to:: Patient Choice offered to / list presented to : Patient  Expected Discharge Plan and Services Expected Discharge Plan: [place] In-house Referral: NA Discharge Planning Services: NA Post Acute Care Choice: [place] Living arrangements for the past 2 months: Single Family Home                 DME Arranged: N/A DME Agency: NA       HH Arranged: NA HH Agency: NA        Prior Living Arrangements/Services Living arrangements for the past 2 months: Single Family Home Lives with:: Self Patient language and need for interpreter reviewed:: No Do you feel safe going back to the place where you live?: Yes      Need for Family Participation in Patient  Care: Yes (Comment) Care giver support system in place?: Yes (comment)   Criminal Activity/Legal Involvement Pertinent to Current Situation/Hospitalization: No - Comment as needed  Activities of Daily Living Home Assistive Devices/Equipment: Eyeglasses ADL Screening (condition at time of admission) Patient's cognitive ability adequate to safely complete daily activities?: Yes Is the patient deaf or have difficulty hearing?: No Does the patient have difficulty seeing, even when wearing glasses/contacts?: No Does the patient have difficulty concentrating, remembering, or making decisions?: No Patient able to express need for assistance with ADLs?: Yes Does the patient have difficulty dressing or bathing?: No Independently performs ADLs?: Yes (appropriate for developmental age) Does the patient have difficulty walking or climbing stairs?: No Weakness of Legs: None Weakness of Arms/Hands: None  Permission Sought/Granted Permission sought to share information with : Case Manager Permission granted to share information with : Yes, Verbal Permission Granted     Permission granted to share info w AGENCY: SNF's in the area        Emotional Assessment Appearance:: Appears stated age Attitude/Demeanor/Rapport: Engaged Affect (typically observed): Accepting Orientation: : Oriented to Self, Oriented to Place, Oriented to  Time, Oriented to Situation Alcohol / Substance Use: Not Applicable Psych Involvement: No (comment)  Admission diagnosis:  Renal mass [N28.89] Patient Active Problem List   Diagnosis Date Noted  . Pure hypercholesterolemia 02/18/2019  . Moderate bipolar I disorder, most recent episode depressed ([place])   .  MDD (major depressive disorder), severe ([place]) 05/25/2018  . PONV (postoperative nausea and vomiting)   . Migraines   . History of pleural effusion   . History of attention deficit disorder   . History of endometriosis   . GERD (gastroesophageal reflux disease)   .  Depression   . Chronic kidney disease   . CAD (coronary artery disease)   . Bipolar 1 disorder ([place])   . Anxiety   . Adverse effect of general anesthetic   . Acute vestibular neuronitis   . Abscess of Bartholin's gland   . ADHD (attention deficit hyperactivity disorder)   . Memory difficulty 01/26/2017  . Vertigo 01/26/2017  . Hyperlipidemia LDL goal <70 06/24/2016  . GAD (generalized anxiety disorder) 06/03/2015  . PTSD (post-traumatic stress disorder) 01/01/2014  . Bipolar I disorder, most recent episode depressed ([place]) 01/01/2014  . Major depressive disorder, recurrent severe without psychotic features ([place]) 09/25/2013  . PVC's (premature ventricular contractions) 05/31/2013  . Diastolic dysfunction, left ventricle 05/31/2013  . PAT (paroxysmal atrial tachycardia) ([place]) 05/31/2013  . Essential hypertension 05/31/2013  . Obesity 05/31/2013  . Recurrent major depression-severe ([place]) 04/27/2013  . H/O echocardiogram 07/20/2012  . Generalized anxiety disorder 06/30/2012  . Renal mass 02/15/2012  . GALLSTONES 12/31/2009  . ATTENTION DEFICIT DISORDER 02/20/2007  . Obstructive sleep apnea 02/20/2007  . RESTLESS LEGS SYNDROME 02/20/2007  . Hypothyroidism 03/22/1988   PCP:  Leighton Ruff, MD Pharmacy:   [id number] [place] [id number]: [phone]: [phone]    Social Determinants of Health (SDOH) Interventions    Readmission Risk Interventions No flowsheet data found.

## 2019-08-03 NOTE — Plan of Care (Signed)
  Problem: Education: Goal: Knowledge of General Education information will improve Description: Including pain rating scale, medication(s)/side effects and non-pharmacologic comfort measures Outcome: Progressing   Problem: Elimination: Goal: Will not experience complications related to urinary retention Outcome: Progressing  Pt continues to be anxious and tearful

## 2019-08-03 NOTE — Progress Notes (Signed)
Transition of Care (TOC) -30 day Note          Patient Details   Name: [name]   MRN: [id number]  Date of Birth: May 30, 1945     Transition of Care [facility]) CM/SW Contact   Name: Kathrin Greathouse   Phone Number: [phone]  Date: 08-03-2019  Time: 11:15AM      MUST ID:  [id number]     To Whom it May Concern:     Please be advised that the above patient will require a short-term nursing home stay, anticipated 30 days or less rehabilitation and strengthening. The plan is for return home.

## 2019-08-03 NOTE — NC FL2 (Signed)
[place] LEVEL OF CARE SCREENING TOOL     IDENTIFICATION  Patient Name: [name] Birthdate: 02/19/1946 Sex: female Admission Date (Current Location): 07/31/2019  [facility] and [place] Number:  Herbalist and Address:  [facility],  [place], [place]      Provider Number: [id number]  Attending Physician Name and Address:  Robley Fries, MD  Relative Name and Phone Number:     [name], [name] [id number]  [phone]       Current Level of Care: Hospital Recommended Level of Care: [place] Prior Approval Number:    Date Approved/Denied:   PASRR Number:    Discharge Plan: SNF    Current Diagnoses: Patient Active Problem List   Diagnosis Date Noted  . Pure hypercholesterolemia 02/18/2019  . Moderate bipolar I disorder, most recent episode depressed ([place])   . MDD (major depressive disorder), severe ([place]) 05/25/2018  . PONV (postoperative nausea and vomiting)   . Migraines   . History of pleural effusion   . History of attention deficit disorder   . History of endometriosis   . GERD (gastroesophageal reflux disease)   . Depression   . Chronic kidney disease   . CAD (coronary artery disease)   . Bipolar 1 disorder ([place])   . Anxiety   . Adverse effect of general anesthetic   . Acute vestibular neuronitis   . Abscess of Bartholin's gland   . ADHD (attention deficit hyperactivity disorder)   . Memory difficulty 01/26/2017  . Vertigo 01/26/2017  . Hyperlipidemia LDL goal <70 06/24/2016  . GAD (generalized anxiety disorder) 06/03/2015  . PTSD (post-traumatic stress disorder) 01/01/2014  . Bipolar I disorder, most recent episode depressed ([place]) 01/01/2014  . Major depressive disorder, recurrent severe without psychotic features ([place]) 09/25/2013  . PVC's (premature ventricular contractions) 05/31/2013  . Diastolic dysfunction, left ventricle 05/31/2013  . PAT (paroxysmal atrial  tachycardia) ([place]) 05/31/2013  . Essential hypertension 05/31/2013  . Obesity 05/31/2013  . Recurrent major depression-severe ([place]) 04/27/2013  . H/O echocardiogram 07/20/2012  . Generalized anxiety disorder 06/30/2012  . Renal mass 02/15/2012  . GALLSTONES 12/31/2009  . ATTENTION DEFICIT DISORDER 02/20/2007  . Obstructive sleep apnea 02/20/2007  . RESTLESS LEGS SYNDROME 02/20/2007  . Hypothyroidism 03/22/1988    Orientation RESPIRATION BLADDER Height & Weight     Self, Time, Situation, Place  Normal Continent Weight: 212 lb (96.2 kg) Height:  5\' 4"  (162.6 cm)  BEHAVIORAL SYMPTOMS/MOOD NEUROLOGICAL BOWEL NUTRITION STATUS      Continent Diet(Regular Diet)  AMBULATORY STATUS COMMUNICATION OF NEEDS Skin   Extensive Assist Verbally Normal                       Personal Care Assistance Level of Assistance  Bathing, Feeding, Dressing Bathing Assistance: Limited assistance Feeding assistance: Independent Dressing Assistance: Limited assistance     Functional Limitations Info  Sight, Hearing, Speech Sight Info: Adequate Hearing Info: Adequate Speech Info: Adequate    SPECIAL CARE FACTORS FREQUENCY  PT (By licensed PT), OT (By licensed OT)     PT Frequency: 5x/week OT Frequency: 5x/week            Contractures Contractures Info: Not present    Additional Factors Info  Code Status, Allergies, Psychotropic Code Status Info: Fullcode Allergies Info: Fullcode Psychotropic Info: Ritalin         Current Medications (08/03/2019):  This is the current hospital active medication list Current Facility-Administered  Medications  Medication Dose Route Frequency Provider Last Rate Last Admin  . acetaminophen (TYLENOL) tablet 650 mg  650 mg Oral Q4H PRN Dancy, Amanda, PA-C      . dextrose 5 %-0.45 % sodium chloride infusion   Intravenous Continuous Jacalyn Lefevre D, MD 50 mL/hr at 08/03/19 0748 Rate Change at 08/03/19 0748  . diphenhydrAMINE (BENADRYL) injection 12.5 mg   12.5 mg Intravenous Q6H PRN Dancy, Amanda, PA-C       Or  . diphenhydrAMINE (BENADRYL) 12.5 MG/5ML elixir 12.5 mg  12.5 mg Oral Q6H PRN Dancy, Amanda, PA-C      . docusate sodium (COLACE) capsule 100 mg  100 mg Oral BID Debbrah Alar, PA-C   100 mg at 08/03/19 1049  . ferrous sulfate tablet 325 mg  325 mg Oral Q breakfast Debbrah Alar, PA-C   325 mg at 08/02/19 0819  . HYDROmorphone (DILAUDID) injection 0.5-1 mg  0.5-1 mg Intravenous Q2H PRN Debbrah Alar, PA-C   1 mg at 08/02/19 2119  . HYDROmorphone (DILAUDID) tablet 2 mg  2 mg Oral Q4H PRN Jacalyn Lefevre D, MD      . levothyroxine (SYNTHROID) tablet 75 mcg  75 mcg Oral QAC breakfast Debbrah Alar, PA-C   75 mcg at 08/03/19 0541  . LORazepam (ATIVAN) tablet 0.5 mg  0.5 mg Oral TID PRN Debbrah Alar, PA-C   0.5 mg at 08/02/19 2358  . methylphenidate (RITALIN) tablet 5 mg  5 mg Oral BID WC Dancy, Amanda, PA-C   5 mg at 08/01/19 0819  . nebivolol (BYSTOLIC) tablet 2.5 mg  2.5 mg Oral Q1500 Jacalyn Lefevre D, MD   2.5 mg at 08/03/19 1049  . ondansetron (ZOFRAN) injection 4 mg  4 mg Intravenous Q4H PRN Debbrah Alar, PA-C   4 mg at 08/02/19 1604  . opium-belladonna (B&O) suppository 16.2-60mg   1 suppository Rectal Q6H PRN Dancy, Amanda, PA-C      . polyvinyl alcohol (LIQUIFILM TEARS) 1.4 % ophthalmic solution 1 drop  1 drop Both Eyes PRN Jacalyn Lefevre D, MD   1 drop at 07/31/19 1850  . rOPINIRole (REQUIP) tablet 0.5 mg  0.5 mg Oral QPC supper Debbrah Alar, PA-C   0.5 mg at 08/02/19 1706  . rOPINIRole (REQUIP) tablet 1 mg  1 mg Oral QHS Jacalyn Lefevre D, MD   1 mg at 08/02/19 2120  . rosuvastatin (CRESTOR) tablet 5 mg  5 mg Oral Daily Debbrah Alar, PA-C   5 mg at 08/03/19 1049     Discharge Medications: Please see discharge summary for a list of discharge medications.  Relevant Imaging Results:  Relevant Lab Results:   Additional Information ssn: [id number]  Lia Hopping, LCSW

## 2019-08-03 NOTE — Progress Notes (Signed)
Per Chaplain note, the patient express, " I have not said this in a long time, but I don't want to go on living.",   inquired if the patient was actively suicidal, patient responded,  No. I just do not care if I live or die.  Physician notified. Physician to get Psych evaluation.

## 2019-08-04 DIAGNOSIS — F329 Major depressive disorder, single episode, unspecified: Secondary | ICD-10-CM

## 2019-08-04 LAB — HEMOGLOBIN AND HEMATOCRIT, BLOOD
HCT: 25.5 % — ABNORMAL LOW (ref 36.0–46.0)
Hemoglobin: 7.9 g/dL — ABNORMAL LOW (ref 12.0–15.0)

## 2019-08-04 MED ORDER — ENOXAPARIN SODIUM 40 MG/0.4ML ~~LOC~~ SOLN
40.0000 mg | SUBCUTANEOUS | Status: DC
  Administered 2019-08-04 – 2019-08-06 (×3): 40 mg via SUBCUTANEOUS
  Filled 2019-08-04 (×4): qty 0.4

## 2019-08-04 MED ORDER — ENSURE ENLIVE PO LIQD
237.0000 mL | Freq: Three times a day (TID) | ORAL | Status: DC
  Administered 2019-08-04 – 2019-08-07 (×4): 237 mL via ORAL

## 2019-08-04 MED ORDER — BISACODYL 10 MG RE SUPP
10.0000 mg | Freq: Every day | RECTAL | Status: DC | PRN
  Administered 2019-08-04: 10 mg via RECTAL
  Filled 2019-08-04 (×2): qty 1

## 2019-08-04 MED ORDER — LIDOCAINE 5 % EX PTCH
1.0000 | MEDICATED_PATCH | CUTANEOUS | Status: DC
  Filled 2019-08-04 (×4): qty 1

## 2019-08-04 MED ORDER — ACETAMINOPHEN 500 MG PO TABS
1000.0000 mg | ORAL_TABLET | Freq: Four times a day (QID) | ORAL | Status: DC
  Administered 2019-08-04 – 2019-08-07 (×8): 1000 mg via ORAL
  Filled 2019-08-04 (×12): qty 2

## 2019-08-04 MED ORDER — SENNA 8.6 MG PO TABS
1.0000 | ORAL_TABLET | Freq: Every day | ORAL | Status: DC
  Administered 2019-08-04 – 2019-08-07 (×4): 8.6 mg via ORAL
  Filled 2019-08-04 (×4): qty 1

## 2019-08-04 NOTE — Progress Notes (Addendum)
Urology Inpatient Progress Report   Intv/Subj: Hgb stable at 7.9 from 8.0 yesterday.  Report "not doing well"  Seen by chaplain, psychiatry consulted.  Generally anxious about recovery.  +Flatus today OOB in chair yesterday with discomfort.  AHDS JP with persistent low output. Serous. Will remove today.  Discussed pathology, very happy and relieved, tearful.   Active Problems:   Renal mass  Current Facility-Administered Medications  Medication Dose Route Frequency Provider Last Rate Last Admin   acetaminophen (TYLENOL) tablet 1,000 mg  1,000 mg Oral Q6H Pace, Maryellen D, MD       diphenhydrAMINE (BENADRYL) injection 12.5 mg  12.5 mg Intravenous Q6H PRN Debbrah Alar, PA-C       Or   diphenhydrAMINE (BENADRYL) 12.5 MG/5ML elixir 12.5 mg  12.5 mg Oral Q6H PRN Dancy, Amanda, PA-C       docusate sodium (COLACE) capsule 100 mg  100 mg Oral BID Dancy, Amanda, PA-C   100 mg at 08/03/19 2137   feeding supplement (ENSURE ENLIVE) (ENSURE ENLIVE) liquid 237 mL  237 mL Oral TID WC Pace, Simone Curia D, MD       ferrous sulfate tablet 325 mg  325 mg Oral Q breakfast Debbrah Alar, PA-C   325 mg at 08/02/19 [id number]   HYDROmorphone (DILAUDID) injection 0.5-1 mg  0.5-1 mg Intravenous Q2H PRN Debbrah Alar, PA-C   1 mg at 08/04/19 0848   HYDROmorphone (DILAUDID) tablet 2 mg  2 mg Oral Q4H PRN Jacalyn Lefevre D, MD       levothyroxine (SYNTHROID) tablet 75 mcg  75 mcg Oral QAC breakfast Debbrah Alar, PA-C   75 mcg at 08/04/19 0546   LORazepam (ATIVAN) tablet 0.5 mg  0.5 mg Oral TID PRN Debbrah Alar, PA-C   0.5 mg at 08/03/19 1708   methylphenidate (RITALIN) tablet 5 mg  5 mg Oral BID WC Dancy, Amanda, PA-C   5 mg at 08/01/19 0819   nebivolol (BYSTOLIC) tablet 2.5 mg  2.5 mg Oral Q1500 Jacalyn Lefevre D, MD   2.5 mg at 08/03/19 1049   ondansetron (ZOFRAN) injection 4 mg  4 mg Intravenous Q4H PRN Debbrah Alar, PA-C   4 mg at 08/02/19 1604   opium-belladonna (B&O) suppository 16.2-60mg   1  suppository Rectal Q6H PRN Debbrah Alar, PA-C       polyvinyl alcohol (LIQUIFILM TEARS) 1.4 % ophthalmic solution 1 drop  1 drop Both Eyes PRN Jacalyn Lefevre D, MD   1 drop at 07/31/19 1850   rOPINIRole (REQUIP) tablet 0.5 mg  0.5 mg Oral QPC supper Debbrah Alar, PA-C   0.5 mg at 08/03/19 1711   rOPINIRole (REQUIP) tablet 1 mg  1 mg Oral QHS Jacalyn Lefevre D, MD   1 mg at 08/03/19 2137   rosuvastatin (CRESTOR) tablet 5 mg  5 mg Oral Daily Debbrah Alar, PA-C   5 mg at 08/03/19 1049   senna (SENOKOT) tablet 8.6 mg  1 tablet Oral Daily Jacalyn Lefevre D, MD         Objective: Vital: Vitals:   08/03/19 0635 08/03/19 1353 08/03/19 2133 08/04/19 0538  BP: (!) 115/57 120/61 129/67 135/67  Pulse: 71 67 70 70  Resp: 18 17 18 19   Temp: 99 F (37.2 C) 98.8 F (37.1 C) 98.7 F (37.1 C) 98.7 F (37.1 C)  TempSrc: Oral Oral Oral Oral  SpO2: 98% 97% 100% 96%  Weight:      Height:       I/Os: I/O last 3 completed shifts: In: 87 [  P.O.:1300; I.V.:2460; IV Piggyback:350] Out: [id number] [Urine:3000; Drains:40]  Physical Exam:  General: Patient is in no apparent distress. Tired  Lungs: Normal respiratory effort, chest expands symmetrically. GI: Incisions are c/d/i with staples and surrounding ecchymosis.  The abdomen is soft and appropriately tender throughout; nondistended JP drain with minimal serous drainage Ext: lower extremities symmetric, SCDs on and functioning  Lab Results: Recent Labs    08/02/19 1406 08/03/19 0456 08/04/19 0529  HGB 8.8* 8.0* 7.9*  HCT 28.4* 25.6* 25.5*   Recent Labs    08/02/19 0437 08/03/19 0456  NA 132* 132*  K 4.0 3.6  CL 99 100  CO2 24 26  GLUCOSE 126* 134*  BUN 10 8  CREATININE 1.12* 1.03*  CALCIUM 8.1* 8.1*   No results for input(s): LABPT, INR in the last 72 hours. No results for input(s): LABURIN in the last 72 hours. Results for orders placed or performed during the hospital encounter of 07/31/19  SARS CORONAVIRUS 2 (TAT 6-24 HRS)      Status: None   Collection Time: 07/27/19  1:24 PM  Result Value Ref Range Status   SARS Coronavirus 2 NEGATIVE NEGATIVE Final    Comment: (NOTE) SARS-CoV-2 target nucleic acids are NOT DETECTED. The SARS-CoV-2 RNA is generally detectable in upper and lower respiratory specimens during the acute phase of infection. Negative results do not preclude SARS-CoV-2 infection, do not rule out co-infections with other pathogens, and should not be used as the sole basis for treatment or other patient management decisions. Negative results must be combined with clinical observations, patient history, and epidemiological information. The expected result is Negative. Fact Sheet for Patients: SugarRoll.be Fact Sheet for Healthcare Providers: https://www.woods-mathews.com/ This test is not yet approved or cleared by the [place] FDA and  has been authorized for detection and/or diagnosis of SARS-CoV-2 by FDA under an Emergency Use Authorization (EUA). This EUA will remain  in effect (meaning this test can be used) for the duration of the COVID-19 declaration under Section 56 4(b)(1) of the Act, 21 U.S.C. section 360bbb-3(b)(1), unless the authorization is terminated or revoked sooner. Performed at [facility], [place] [address], [place], [place] [address]   SARS CORONAVIRUS 2 (TAT 6-24 HRS) Nasopharyngeal Nasopharyngeal Swab     Status: None   Collection Time: 08/03/19  9:21 AM   Specimen: Nasopharyngeal Swab  Result Value Ref Range Status   SARS Coronavirus 2 NEGATIVE NEGATIVE Final    Comment: (NOTE) SARS-CoV-2 target nucleic acids are NOT DETECTED. The SARS-CoV-2 RNA is generally detectable in upper and lower respiratory specimens during the acute phase of infection. Negative results do not preclude SARS-CoV-2 infection, do not rule out co-infections with other pathogens, and should not be used as the sole basis for treatment or other  patient management decisions. Negative results must be combined with clinical observations, patient history, and epidemiological information. The expected result is Negative. Fact Sheet for Patients: SugarRoll.be Fact Sheet for Healthcare Providers: https://www.woods-mathews.com/ This test is not yet approved or cleared by the [place] FDA and  has been authorized for detection and/or diagnosis of SARS-CoV-2 by FDA under an Emergency Use Authorization (EUA). This EUA will remain  in effect (meaning this test can be used) for the duration of the COVID-19 declaration under Section 56 4(b)(1) of the Act, 21 U.S.C. section 360bbb-3(b)(1), unless the authorization is terminated or revoked sooner. Performed at [facility], [place] [address] [place], [place] [address]      Assessment: 74 yo  woman with right renal mass POD4 s/p right hand-assisted laparoscopic converted to open radical nephrectomy with repair of inferior vena cavotomy. 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   Creatinine stable and now with return of bowel function.  Hemaglobin is stabilizing with low JP output and stable vitals.   Significant anxiety and concern for not wanting to live or die; psychiatry consulted.   Slow to mobilize although working with PT, planning for SNF.  Plan: -Multimodal pain control oxycodone and dilaudid IV, sch Tylenol -Home Ativan -Psychiatry consult -[place] VS -General diet -[place] -Pathology has been discussed -No indication for antibiotics   -DVT ppx: SCDs and OOB;  -Start lovenox given stable Hgb and slow to mobilize -Check hgb tomorrow AM unless becomes HD unstable prior; d/c drain if Hgb stable and minimal output  -continue PT; PT has recommended SNF; contacted case manager Elmyra Ricks [phone] today to begin working on this (pre-SNF COVID test has been ordered); anticipated d/c Sunday

## 2019-08-04 NOTE — Consult Note (Addendum)
[facility] Consult   Reason for Consult: ''Requires SNF placement, also hx of depression and endorsing not wanting to live, denies SI.'' Referring Physician:  Robley Fries, MD Patient Identification: [name] MRN:  [id number] Principal Diagnosis: Chronic depression not affecting current episode of care Diagnosis:  Principal Problem:   Chronic depression not affecting current episode of care Active Problems:   Renal mass   Generalized anxiety disorder   PTSD (post-traumatic stress disorder)   Total Time spent with patient: 1 hour  Subjective:   [name] is a 74 y.o. female patient admitted with due to Right renal mass.  HPI: 74 year old female with history of Medication resistant depression and GAD who was admitted for Right Renal mass surgery. Patient is S/p assisted laparoscopic radical nephrectomy but consult was called due to concern about patient ''endorsing not wanting to live but denies SI''. Patient reports long history of depression which has been medication resistant, she had trials of multiple anti-depressant without success due to adverse reactions. Patient reports that she has been seeing her psychiatrist regularly([facility]) who has been seeking TMS therapy  to treat the chronic depression.Today, patient report vague anxiety, depressive symptoms but vehemently denies self harming thoughts, psychosis or delusional thinking. Patient denies history of alcohol/drug abuse.  Past Psychiatric History: as above  Risk to Self:  denies Risk to Others:  denies Prior Inpatient Therapy:  none Prior Outpatient Therapy:  [facility]  Past Medical History:  Past Medical History:  Diagnosis Date  . Abscess of Bartholin's gland   . Acute vestibular neuronitis    Dr Lucia Gaskins  . ADHD (attention deficit hyperactivity disorder)   . Adverse effect of general anesthetic    felt paralyzed while receiving anesthesia  . Anxiety   . Bipolar 1  disorder ([place])   . CAD (coronary artery disease), native coronary artery    cath 05/2016 showing 50-70% stenosis in the mid LAD proximal to the first diagonal and 70-80% small OM1. FFR of LAD  not performed because of difficulty with catheter control from the right radial.   . Chronic kidney disease    kidney cancer- pt states she has elected to not have it treated.  . Depression    Bipolar disorder/goes to [facility] for meds  . Difficult intubation    told by [facility] that she was hard to intubate 15b yrs ago in [place]- surgery since then no problems  . Dyspnea   . Dysrhythmia    tachycardia  . GERD (gastroesophageal reflux disease)   . H/O echocardiogram 07/2012   [place] LVF w grade I siastolic dysfunction   . History of attention deficit disorder   . History of endometriosis   . History of pleural effusion   . Hyperlipidemia LDL goal <70 06/24/2016  . Hypertension   . 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 Memory difficulty 01/26/2017  . Migraines   . Obesity   . OSA (obstructive sleep apnea)    uses oral appliance instead of CPAP  . PAT (paroxysmal atrial tachycardia) (HCC)    s/p ablation  . PONV (postoperative nausea and vomiting)   . PTSD (post-traumatic stress disorder)   . PVC's (premature ventricular contractions)   . Renal mass 02/15/2012  . RLS (restless legs syndrome)    Dr Gwenette Greet  . rt renal ca dx'd 12/2009   no treatment/ no surg  . Vertigo     Past Surgical History:  Procedure Laterality Date  . CARDIAC ELECTROPHYSIOLOGY [place] AND ABLATION  2000s  . LAPAROSCOPIC NEPHRECTOMY, HAND ASSISTED Right 07/31/2019   Procedure: HAND ASSISTED LAPAROSCOPIC NEPHRECTOMY CONVERTED TO OPEN WITH REPAIR OF INFERIOR VENA CAVAOTOMY;  Surgeon: Robley Fries, MD;  Location: [facility];  Service: Urology;  Laterality: Right;  3 HRS  . laparoscopy    . LEFT HEART CATH AND CORONARY ANGIOGRAPHY N/A 06/08/2016   Procedure: Left Heart Cath and Coronary  Angiography;  Surgeon: Belva Crome, MD;  Location: [facility];  Service: Cardiovascular;  Laterality: N/A;  . nasoseptal reconstruction  1990s  . THYROIDECTOMY  1990  . TONSILLECTOMY     as a child  . TUBAL LIGATION  1970s  . uterine mass removal  03/2012   was found to be benign   Family History:  Family History  Problem Relation Age of Onset  . Allergies Father   . Skin cancer Father   . Bipolar disorder Father   . Alcohol abuse Father   . Heart disease Mother   . Depression Mother   . Hypertension Mother   . CAD Mother   . Colon cancer Paternal Grandmother   . OCD Paternal Grandmother   . Allergies Sister        multiple  . Allergies Brother        multiple  . Allergies Daughter   . Heart disease Maternal Grandfather   . Cancer Paternal Grandfather    Family Psychiatric  History:  Social History:  Social History   Substance and Sexual Activity  Alcohol Use Not Currently  . Alcohol/week: 1.0 standard drinks  . Types: 1 Glasses of wine per week     Social History   Substance and Sexual Activity  Drug Use No    Social History   Socioeconomic History  . Marital status: Single    Spouse name: Not on file  . Number of children: 1  . Years of education: 47  . Highest education level: Not on file  Occupational History  . 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 Smoking status: Never Smoker  . Smokeless tobacco: Never Used  Substance and Sexual Activity  . Alcohol use: Not Currently    Alcohol/week: 1.0 standard drinks    Types: 1 Glasses of wine per week  . Drug use: No  . Sexual activity: Yes    Partners: Male    Birth control/protection: None  Other Topics Concern  . Not on file  Social History Narrative   Divorced and lives alone   Has children   Caffeine use: none   Right handed    Social Determinants of Health   Financial Resource Strain:   . Difficulty of Paying Living Expenses:   Food Insecurity:   .  Worried About Charity fundraiser in the Last Year:   . Arboriculturist in the Last Year:   Transportation Needs:   . Film/video editor (Medical):   Marland Kitchen Lack of Transportation (Non-Medical):   Physical Activity:   . Days of Exercise per Week:   . Minutes of Exercise per Session:   Stress:   . Feeling of Stress :   Social Connections:   . Frequency of Communication with Friends and Family:   . Frequency of Social Gatherings with Friends and Family:   . Attends Religious Services:   . Active Member of Clubs or Organizations:   . Attends Archivist Meetings:   .  Marital Status:    Additional Social History:    Allergies:   Allergies  Allergen Reactions  . Geodon [Ziprasidone Hydrochloride] Other (See Comments)    Extremely agitated  . Lithium Nausea Only and Other (See Comments)    Off balance, increased heart rate  . Talwin [Pentazocine] Other (See Comments)    Hallucinations   . Toradol [Ketorolac Tromethamine] Other (See Comments)    Chest pains  . Ziprasidone Other (See Comments)    Extremely agitated  . Abilify [Aripiprazole] Other (See Comments)    jerking  . Compazine [Prochlorperazine Edisylate] Nausea And Vomiting  . Latuda [Lurasidone Hcl] Other (See Comments)    Reports made her mind race more and irritable   . Pristiq [Desvenlafaxine Succinate Er] Other (See Comments)    Did not work, prefers not to take  . Rexulti [Brexpiprazole] Swelling    Elevated BP, created aggression  . Diflucan [Fluconazole] Nausea And Vomiting    Labs:  Results for orders placed or performed during the hospital encounter of 07/31/19 (from the past 48 hour(s))  Hemoglobin and hematocrit, blood     Status: Abnormal   Collection Time: 08/02/19  2:06 PM  Result Value Ref Range   Hemoglobin 8.8 (L) 12.0 - 15.0 g/dL   HCT 28.4 (L) 36.0 - 46.0 %    Comment: Performed at [facility], [place] [address] [place], [place] [address]  Hemoglobin and hematocrit,  blood     Status: Abnormal   Collection Time: 08/03/19  4:56 AM  Result Value Ref Range   Hemoglobin 8.0 (L) 12.0 - 15.0 g/dL   HCT 25.6 (L) 36.0 - 46.0 %    Comment: Performed at [facility], [place] [address], [place], [place] [id number]  Basic metabolic panel     Status: Abnormal   Collection Time: 08/03/19  4:56 AM  Result Value Ref Range   Sodium 132 (L) 135 - 145 mmol/L   Potassium 3.6 3.5 - 5.1 mmol/L   Chloride 100 98 - 111 mmol/L   CO2 26 22 - 32 mmol/L   Glucose, Bld 134 (H) 70 - 99 mg/dL    Comment: Glucose reference range applies only to samples taken after fasting for at least 8 hours.   BUN 8 8 - 23 mg/dL   Creatinine, Ser 1.03 (H) 0.44 - 1.00 mg/dL   Calcium 8.1 (L) 8.9 - 10.3 mg/dL   GFR calc non Af Amer 54 (L) >60 mL/min   GFR calc Af Amer >60 >60 mL/min   Anion gap 6 5 - 15    Comment: Performed at [facility], [place] [address] [place], [place] [address]  SARS CORONAVIRUS 2 (TAT 6-24 HRS) Nasopharyngeal Nasopharyngeal Swab     Status: None   Collection Time: 08/03/19  9:21 AM   Specimen: Nasopharyngeal Swab  Result Value Ref Range   SARS Coronavirus 2 NEGATIVE NEGATIVE    Comment: (NOTE) SARS-CoV-2 target nucleic acids are NOT DETECTED. The SARS-CoV-2 RNA is generally detectable in upper and lower respiratory specimens during the acute phase of infection. Negative results do not preclude SARS-CoV-2 infection, do not rule out co-infections with other pathogens, and should not be used as the sole basis for treatment or other patient management decisions. Negative results must be combined with clinical observations, patient history, and epidemiological information. The expected result is Negative. Fact Sheet for Patients: SugarRoll.be Fact Sheet for Healthcare Providers: https://www.woods-mathews.com/ This test is not yet approved or cleared by the  [place] Architectural technologist and  has been  authorized for detection and/or diagnosis of SARS-CoV-2 by FDA under an Print production planner (EUA). This EUA will remain  in effect (meaning this test can be used) for the duration of the COVID-19 declaration under Section 56 4(b)(1) of the Act, 21 U.S.C. section 360bbb-3(b)(1), unless the authorization is terminated or revoked sooner. Performed at [facility], [place] [address] [place], [place] [address]   Hemoglobin and hematocrit, blood     Status: Abnormal   Collection Time: 08/04/19  5:29 AM  Result Value Ref Range   Hemoglobin 7.9 (L) 12.0 - 15.0 g/dL   HCT 25.5 (L) 36.0 - 46.0 %    Comment: Performed at [facility], [place] [address] [place], [place] [address]   *Note: Due to a large number of results and/or encounters for the requested time period, some results have not been displayed. A complete set of results can be found in Results Review.    Current Facility-Administered Medications  Medication Dose Route Frequency Provider Last Rate Last Admin  . acetaminophen (TYLENOL) tablet 1,000 mg  1,000 mg Oral Q6H Jacalyn Lefevre D, MD   1,000 mg at 08/04/19 1018  . bisacodyl (DULCOLAX) suppository 10 mg  10 mg Rectal Daily PRN Jacalyn Lefevre D, MD      . diphenhydrAMINE (BENADRYL) injection 12.5 mg  12.5 mg Intravenous Q6H PRN Dancy, Amanda, PA-C       Or  . diphenhydrAMINE (BENADRYL) 12.5 MG/5ML elixir 12.5 mg  12.5 mg Oral Q6H PRN Dancy, Amanda, PA-C      . docusate sodium (COLACE) capsule 100 mg  100 mg Oral BID Debbrah Alar, PA-C   100 mg at 08/04/19 1018  . enoxaparin (LOVENOX) injection 40 mg  40 mg Subcutaneous Q24H Jacalyn Lefevre D, MD   40 mg at 08/04/19 1018  . feeding supplement (ENSURE ENLIVE) (ENSURE ENLIVE) liquid 237 mL  237 mL Oral TID WC Jacalyn Lefevre D, MD   237 mL at 08/04/19 1034  . ferrous sulfate tablet 325 mg  325 mg Oral Q breakfast Debbrah Alar, PA-C   325 mg at 08/04/19 1017  . HYDROmorphone (DILAUDID) injection 0.5-1 mg   0.5-1 mg Intravenous Q2H PRN Debbrah Alar, PA-C   1 mg at 08/04/19 0848  . HYDROmorphone (DILAUDID) tablet 2 mg  2 mg Oral Q4H PRN Jacalyn Lefevre D, MD      . levothyroxine (SYNTHROID) tablet 75 mcg  75 mcg Oral QAC breakfast Debbrah Alar, PA-C   75 mcg at 08/04/19 0546  . lidocaine (LIDODERM) 5 % 1 patch  1 patch Transdermal Q24H Jacalyn Lefevre D, MD      . LORazepam (ATIVAN) tablet 0.5 mg  0.5 mg Oral TID PRN Debbrah Alar, PA-C   0.5 mg at 08/04/19 1034  . methylphenidate (RITALIN) tablet 5 mg  5 mg Oral BID WC Dancy, Amanda, PA-C   5 mg at 08/01/19 0819  . nebivolol (BYSTOLIC) tablet 2.5 mg  2.5 mg Oral Q1500 Jacalyn Lefevre D, MD   2.5 mg at 08/04/19 1018  . ondansetron (ZOFRAN) injection 4 mg  4 mg Intravenous Q4H PRN Debbrah Alar, PA-C   4 mg at 08/02/19 1604  . opium-belladonna (B&O) suppository 16.2-60mg   1 suppository Rectal Q6H PRN Dancy, Amanda, PA-C      . polyvinyl alcohol (LIQUIFILM TEARS) 1.4 % ophthalmic solution 1 drop  1 drop Both Eyes PRN Jacalyn Lefevre D, MD   1 drop at 07/31/19 1850  .  rOPINIRole (REQUIP) tablet 0.5 mg  0.5 mg Oral QPC supper Debbrah Alar, PA-C   0.5 mg at 08/03/19 1711  . rOPINIRole (REQUIP) tablet 1 mg  1 mg Oral QHS Jacalyn Lefevre D, MD   1 mg at 08/03/19 2137  . rosuvastatin (CRESTOR) tablet 5 mg  5 mg Oral Daily Dancy, Amanda, PA-C   5 mg at 08/04/19 1018  . senna (SENOKOT) tablet 8.6 mg  1 tablet Oral Daily Jacalyn Lefevre D, MD   8.6 mg at 08/04/19 1018    Musculoskeletal: Strength & Muscle Tone: within normal limits Gait & Station: unable to stand, S/P Surgery Patient leans: N/A  Psychiatric Specialty Exam: Physical Exam  Psychiatric: Her speech is normal. Judgment and thought content normal. Her mood appears anxious. Her affect is blunt. She is slowed. Cognition and memory are normal.    Review of Systems  Constitutional: Positive for fatigue.  HENT: Negative.   Eyes: Negative.   Neurological: Negative.   Psychiatric/Behavioral:  Positive for dysphoric mood.    Blood pressure 135/67, pulse 70, temperature 98.7 F (37.1 C), temperature source Oral, resp. rate 19, height 5\' 4"  (1.626 m), weight 96.2 kg, SpO2 96 %.Body mass index is 36.39 kg/m.  General Appearance: Casual  Eye Contact:  Good  Speech:  Clear and Coherent  Volume:  Decreased  Mood:  Dysphoric  Affect:  Congruent  Thought Process:  Coherent and Linear  Orientation:  Full (Time, Place, and Person)  Thought Content:  Logical  Suicidal Thoughts:  No  Homicidal Thoughts:  No  Memory:  Immediate;   Good Recent;   Good Remote;   Good  Judgement:  Intact  Insight:  Good  Psychomotor Activity:  Psychomotor Retardation  Concentration:  Concentration: Good and Attention Span: Good  Recall:  Good  Fund of Knowledge:  Good  Language:  Good  Akathisia:  No  Handed:  Right  AIMS (if indicated):     Assets:  Communication Skills Desire for Improvement  ADL's:  Intact  Cognition:  WNL  Sleep:   fair     Treatment Plan Summary: 74 year old female with history of medication resistant depression who is s/p nephrectomy secondary to renal mass. Today, patient is calm, cooperative, denies, psychosis, delusions, SI/HI. She reports chronic depression but cannot take anti-depressant due to allergic reactions. She will rather work with her outpatient psychiatrist to get [place] therapy treatment for depression. Patient is cleared by psych service, she does not meet criteria for inpatient psych admission.  Disposition: No evidence of imminent risk to self or others at present.   Patient does not meet criteria for psychiatric inpatient admission. Supportive therapy provided about ongoing stressors. Psych service signing out. Re-consult as needed.  Corena Pilgrim, MD 08/04/2019 1:52 PM

## 2019-08-04 NOTE — Progress Notes (Signed)
Pt states that she is not going to wear CPAP tonight.  Machine remains at bedside, Pt to call if she decides to wear.  RT to monitor and assess as needed.

## 2019-08-04 NOTE — Plan of Care (Signed)

## 2019-08-04 NOTE — Progress Notes (Signed)
PT Cancellation Note  Patient Details Name: JAMIRIA HANNOLD MRN: [id number] DOB: 16-Dec-1945   Cancelled Treatment:    Reason Eval/Treat Not Completed: Patient declined. Nursing just leaving room and pt and nurse had coordinated pain meds and getting OOB in an hour.  Pt declined PT services at this time, but nurse stated they will be assisting pt OOB to chair.  Will check back as schedule permits.   Galen Manila 08/04/2019, 11:24 AM

## 2019-08-05 LAB — HEMOGLOBIN AND HEMATOCRIT, BLOOD
HCT: 25.3 % — ABNORMAL LOW (ref 36.0–46.0)
Hemoglobin: 7.9 g/dL — ABNORMAL LOW (ref 12.0–15.0)

## 2019-08-05 LAB — COLOGUARD: COLOGUARD: NEGATIVE

## 2019-08-05 NOTE — Progress Notes (Addendum)
Urology Inpatient Progress Report   Intv/Subj: Hgb stable at 7.9 Reports doing better this morning.  Seen by psychiatry yesterday, no further inpatient needs. She would like to have further discussions about long term therapies but understands this will be done in an outpatient setting. She is requesting an increase of afternoon Ativan dose.  Still anxious about recovery.  +Flatus today. Small BM yesterday.  Walked yesterday, self reports that it is going better AHDS  Principal Problem:   Chronic depression not affecting current episode of care Active Problems:   Renal mass   Generalized anxiety disorder   PTSD (post-traumatic stress disorder)  Objective: Vital: Vitals:   08/04/19 0538 08/04/19 1529 08/04/19 2132 08/05/19 0630  BP: 135/67 131/75 (!) 143/78 (!) 147/62  Pulse: 70 (!) 58 66 67  Resp: 19 16 18 18   Temp: 98.7 F (37.1 C) 97.7 F (36.5 C) 97.9 F (36.6 C) 98 F (36.7 C)  TempSrc: Oral  Oral Oral  SpO2: 96% 99% 98% 100%  Weight:      Height:       I/Os: I/O last 3 completed shifts: In: 1634.1 [P.O.:1420; I.V.:204.1; Other:10] Out: [id number] [Urine:1350; Drains:285]  Physical Exam:  General: Patient is in no apparent distress. Brighter spirits Lungs: Normal respiratory effort, chest expands symmetrically. GI: Incisions are c/d/i with staples and surrounding ecchymosis.  The abdomen is soft and appropriately tender throughout; nondistended Prior JP site clean and dry.  Ext: lower extremities symmetric, SCDs on and functioning  Lab Results: Recent Labs    08/03/19 0456 08/04/19 0529 08/05/19 0715  HGB 8.0* 7.9* 7.9*  HCT 25.6* 25.5* 25.3*   Recent Labs    08/03/19 0456  NA 132*  K 3.6  CL 100  CO2 26  GLUCOSE 134*  BUN 8  CREATININE 1.03*  CALCIUM 8.1*   Assessment: 74 yo woman with right renal mass POD5 s/p right hand-assisted laparoscopic converted to open radical nephrectomy with repair of inferior vena cavotomy. Final pathology with ccRCC  pT3a with negative surgical margins.   Creatinine stable and now with return of bowel function.  Hemaglobin is stable  JP removed on POD4.   Significant anxiety and concern for not wanting to live or die; psychiatry consulted and no further inpatient needs.   Slow to mobilize although working with PT, planning for SNF.  Plan: -Multimodal pain control oxycodone and dilaudid IV, sch Tylenol -Home Ativan, will discuss dosing with psychiatry who recommended not changing at this time. -Home Requip, Ritalin, Bystolic -Psychiatry consult completed, no interventions or new medications -Montior VS -General diet -[place]lock -Bowel regimen -Pathology has been discussed -No indication for antibiotics   -DVT ppx: SCDs and OOB;  -Lovenox given stable Hgb and slow to mobilize -Check hgb if becomes HD unstable prior -JP has been removed -Continue PT; PT has recommended SNF; Case manager working on this re [place] number. Anticipate tomorrow.

## 2019-08-05 NOTE — Progress Notes (Signed)
Pt has declined use of CPAP QHS at this time, Pt to notify RT if she decides to wear.  RT to monitor and assess as needed.

## 2019-08-05 NOTE — Plan of Care (Signed)
  Problem: Education: Goal: Knowledge of General Education information will improve Description Including pain rating scale, medication(s)/side effects and non-pharmacologic comfort measures Outcome: Progressing   Problem: Health Behavior/Discharge Planning: Goal: Ability to manage health-related needs will improve Outcome: Progressing   

## 2019-08-06 ENCOUNTER — Telehealth (HOSPITAL_COMMUNITY): Payer: Self-pay | Admitting: *Deleted

## 2019-08-06 MED ORDER — OXYCODONE HCL 5 MG PO TABS
5.0000 mg | ORAL_TABLET | ORAL | Status: DC | PRN
  Administered 2019-08-06: 5 mg via ORAL
  Filled 2019-08-06 (×2): qty 1

## 2019-08-06 MED ORDER — SODIUM CHLORIDE 0.9% FLUSH
10.0000 mL | Freq: Two times a day (BID) | INTRAVENOUS | Status: DC
  Administered 2019-08-06: 10 mL

## 2019-08-06 MED ORDER — POLYETHYLENE GLYCOL 3350 17 G PO PACK
17.0000 g | PACK | Freq: Every day | ORAL | Status: DC
  Administered 2019-08-06 – 2019-08-07 (×2): 17 g via ORAL
  Filled 2019-08-06 (×2): qty 1

## 2019-08-06 MED ORDER — CHLORHEXIDINE GLUCONATE CLOTH 2 % EX PADS
6.0000 | MEDICATED_PAD | Freq: Every day | CUTANEOUS | Status: DC
  Administered 2019-08-06 – 2019-08-07 (×2): 6 via TOPICAL

## 2019-08-06 NOTE — TOC Progression Note (Signed)
Transition of Care [facility]) - Progression Note    Patient Details  Name: [name] MRN: [id number] Date of Birth: [birth year]  Transition of Care [facility]) CM/SW Contact  Lennart Pall, LCSW Phone Number: 08/06/2019, 10:13 AM  Clinical Narrative:   Please note we are still awaiting PASRR level 2 approval as well as insurance auth for transfer to SNF.    Expected Discharge Plan: [place]    Expected Discharge Plan and Services Expected Discharge Plan: [place] In-house Referral: NA Discharge Planning Services: NA Post Acute Care Choice: [place] Living arrangements for the past 2 months: Single Family Home                 DME Arranged: N/A DME Agency: NA       HH Arranged: NA HH Agency: NA         Social Determinants of Health (SDOH) Interventions    Readmission Risk Interventions No flowsheet data found.

## 2019-08-06 NOTE — Telephone Encounter (Signed)
Pt called requesting increase in Lorazepam from 0.5mg  tid prnto 1 mg prn tid due to the fact that she is in the hospital having a procedure to remove a malignant growth from what I could understand. Pt is receiving opioids in hospital currently. Pt nexy appointment with you is on 08/09/19. Please review and advise.

## 2019-08-06 NOTE — Care Management Important Message (Signed)
Important Message  Patient Details IM Letter given to [place] Case Manager to present to the Patient Name: [name] MRN: [id number] Date of Birth: 07-24-1945   Medicare Important Message Given:  Yes     Kerin Salen 08/06/2019, 12:39 PM

## 2019-08-06 NOTE — Progress Notes (Signed)
Follow up with [name] for continued support.  She noted that she had experienced some anxiety this morning.  We began to speak about her coping through this, when she was contacted by a Education officer, museum from [facility].  [name] and Chaplain planned to follow up tomorrow, as she wished to take the call.

## 2019-08-06 NOTE — Progress Notes (Signed)
Physical Therapy Treatment Patient Details Name: MARYA FAVERO MRN: [id number] DOB: [birth year] Today's Date: 08/06/2019    History of Present Illness 74 yo woman with large right renal mass s/p right hand-assisted laparoscopic converted to open nephrectomy with repair of inferior vena cavotomy    PT Comments    Pt performed standing for approx 5 minutes.  Pt encouraged to march in place since she declined ambulation despite encouragement.  Pt plans to d/c to SNF.    Follow Up Recommendations  SNF;Supervision/Assistance - 24 hour     Equipment Recommendations  Rolling walker with 5" wheels    Recommendations for Other Services       Precautions / Restrictions Precautions Precautions: Fall Precaution Comments: abdominal incision, Right JP drain    Mobility  Bed Mobility               General bed mobility comments: pt left sitting EOB, reports she doesn't need assist for bed mobility  Transfers Overall transfer level: Needs assistance Equipment used: Rolling walker (2 wheeled) Transfers: Sit to/from [facility] Sit to Stand: Min guard Stand pivot transfers: Min guard       General transfer comment: verbal cues for hand placement, min/guard for safety, pt reports occasional assist required due to pain however none at time of session (premedicated)  Ambulation/Gait                 Stairs             Wheelchair Mobility    Modified Rankin (Stroke Patients Only)       Balance Overall balance assessment: Needs assistance         Standing balance support: Bilateral upper extremity supported Standing balance-Leahy Scale: Poor Standing balance comment: reliant on UE support, able to march in place for 30 seconds, remained standing approx 5 minutes, declined ambulating                            Cognition Arousal/Alertness: Awake/alert Behavior During Therapy: WFL for tasks assessed/performed Overall  Cognitive Status: Within Functional Limits for tasks assessed                                        Exercises      General Comments        Pertinent Vitals/Pain Pain Assessment: Faces Faces Pain Scale: Hurts little more Pain Location: abdomen Pain Descriptors / Indicators: Sore;Tender Pain Intervention(s): Repositioned;Premedicated before session;Monitored during session    Home Living                      Prior Function            PT Goals (current goals can now be found in the care plan section) Progress towards PT goals: Progressing toward goals    Frequency    Min 3X/week      PT Plan Current plan remains appropriate    Co-evaluation              AM-PAC PT "6 Clicks" Mobility   Outcome Measure  Help needed turning from your back to your side while in a flat bed without using bedrails?: A Little Help needed moving from lying on your back to sitting on the side of a flat bed without using bedrails?: A Little Help needed moving to and  from a bed to a chair (including a wheelchair)?: A Little Help needed standing up from a chair using your arms (e.g., wheelchair or bedside chair)?: A Little Help needed to walk in hospital room?: A Little Help needed climbing 3-5 steps with a railing? : A Lot 6 Click Score: 17    End of Session Equipment Utilized During Treatment: Gait belt Activity Tolerance: Patient tolerated treatment well Patient left: in bed;with call bell/phone within reach   PT Visit Diagnosis: Other abnormalities of gait and mobility (R26.89)     Time: 1343-1401 PT Time Calculation (min) (ACUTE ONLY): 18 min  Charges:  $Therapeutic Activity: 8-22 mins                    Arlyce Dice, DPT Acute Rehabilitation Services Office: [place] 08/06/2019, 3:35 PM

## 2019-08-06 NOTE — Progress Notes (Signed)
Pt has declined use of CPAP QHS, Pt states that she will notify RT if she decides to wear.  RT to monitor and assess as needed.

## 2019-08-06 NOTE — Telephone Encounter (Signed)
Opened in error

## 2019-08-06 NOTE — Progress Notes (Signed)
Urology Inpatient Progress Report  Renal mass [N28.89]  Procedure(s): HAND ASSISTED LAPAROSCOPIC NEPHRECTOMY CONVERTED TO OPEN WITH REPAIR OF INFERIOR VENA CAVAOTOMY  6 Days Post-Op   Intv/Subj: No acute events overnight.  She has had return of bowel function and is awaiting SNF placement.  Patient sitting in bedside chair today with notebook and a number of questions discussed/answered.    Principal Problem:   Chronic depression not affecting current episode of care Active Problems:   Renal mass   Generalized anxiety disorder   PTSD (post-traumatic stress disorder)  Current Facility-Administered Medications  Medication Dose Route Frequency Provider Last Rate Last Admin  . acetaminophen (TYLENOL) tablet 1,000 mg  1,000 mg Oral Q6H Jacalyn Lefevre D, MD   1,000 mg at 08/05/19 1759  . bisacodyl (DULCOLAX) suppository 10 mg  10 mg Rectal Daily PRN Jacalyn Lefevre D, MD   10 mg at 08/04/19 1838  . Chlorhexidine Gluconate Cloth 2 % PADS 6 each  6 each Topical Daily Estalene Bergey, Simone Curia D, MD      . diphenhydrAMINE (BENADRYL) injection 12.5 mg  12.5 mg Intravenous Q6H PRN Dancy, Amanda, PA-C       Or  . diphenhydrAMINE (BENADRYL) 12.5 MG/5ML elixir 12.5 mg  12.5 mg Oral Q6H PRN Debbrah Alar, PA-C   12.5 mg at 08/06/19 [id number]  . docusate sodium (COLACE) capsule 100 mg  100 mg Oral BID Debbrah Alar, PA-C   100 mg at 08/05/19 2115  . enoxaparin (LOVENOX) injection 40 mg  40 mg Subcutaneous Q24H Jacalyn Lefevre D, MD   40 mg at 08/05/19 [id number]  . feeding supplement (ENSURE ENLIVE) (ENSURE ENLIVE) liquid 237 mL  237 mL Oral TID WC Jacalyn Lefevre D, MD   237 mL at 08/05/19 1307  . ferrous sulfate tablet 325 mg  325 mg Oral Q breakfast Debbrah Alar, PA-C   325 mg at 08/04/19 1017  . HYDROmorphone (DILAUDID) injection 0.5-1 mg  0.5-1 mg Intravenous Q2H PRN Debbrah Alar, PA-C   1 mg at 08/06/19 0143  . HYDROmorphone (DILAUDID) tablet 2 mg  2 mg Oral Q4H PRN Jacalyn Lefevre D, MD   2 mg at 08/05/19  1632  . levothyroxine (SYNTHROID) tablet 75 mcg  75 mcg Oral QAC breakfast Debbrah Alar, PA-C   75 mcg at 08/06/19 0606  . lidocaine (LIDODERM) 5 % 1 patch  1 patch Transdermal Q24H Jacalyn Lefevre D, MD      . LORazepam (ATIVAN) tablet 0.5 mg  0.5 mg Oral TID PRN Debbrah Alar, PA-C   0.5 mg at 08/06/19 [id number]  . methylphenidate (RITALIN) tablet 5 mg  5 mg Oral BID WC Dancy, Amanda, PA-C   5 mg at 08/05/19 1428  . nebivolol (BYSTOLIC) tablet 2.5 mg  2.5 mg Oral Q1500 Jacalyn Lefevre D, MD   2.5 mg at 08/05/19 0931  . ondansetron (ZOFRAN) injection 4 mg  4 mg Intravenous Q4H PRN Debbrah Alar, PA-C   4 mg at 08/05/19 1632  . opium-belladonna (B&O) suppository 16.2-60mg   1 suppository Rectal Q6H PRN Debbrah Alar, PA-C      . polyvinyl alcohol (LIQUIFILM TEARS) 1.4 % ophthalmic solution 1 drop  1 drop Both Eyes PRN Jacalyn Lefevre D, MD   1 drop at 07/31/19 1850  . rOPINIRole (REQUIP) tablet 0.5 mg  0.5 mg Oral QPC supper Debbrah Alar, PA-C   0.5 mg at 08/05/19 1758  . rOPINIRole (REQUIP) tablet 1 mg  1 mg Oral QHS Jacalyn Lefevre D, MD   1 mg at 08/05/19  2116  . rosuvastatin (CRESTOR) tablet 5 mg  5 mg Oral Daily Dancy, Amanda, PA-C   5 mg at 08/04/19 1018  . senna (SENOKOT) tablet 8.6 mg  1 tablet Oral Daily Jacalyn Lefevre D, MD   8.6 mg at 08/05/19 0940  . sodium chloride flush (NS) 0.9 % injection 10-40 mL  10-40 mL Intracatheter Q12H Jacalyn Lefevre D, MD         Objective: Vital: Vitals:   08/05/19 1326 08/05/19 2140 08/05/19 2202 08/06/19 0547  BP: 135/84  (!) 119/59 (!) 150/87  Pulse: 72 70 (!) 59 64  Resp: 16 18 17 17   Temp: 98.1 F (36.7 C)  97.6 F (36.4 C) 98.2 F (36.8 C)  TempSrc: Oral  Oral Oral  SpO2: 100% 99% 100% 100%  Weight:      Height:       I/Os: I/O last 3 completed shifts: In: 1320 [P.O.:1320] Out: 1350 [Urine:1000; Drains:350]  Physical Exam:  General: Patient is in no apparent distress, sitting in chair with notepad this morning Lungs: Normal  respiratory effort, chest expands symmetrically. GI: Incisions are c/d/i with surrounding ecchymosis.The abdomen is soft and nontender.  Ext: lower extremities symmetric  Lab Results: Recent Labs    08/04/19 0529 08/05/19 0715  HGB 7.9* 7.9*  HCT 25.5* 25.3*   No results for input(s): NA, K, CL, CO2, GLUCOSE, BUN, CREATININE, CALCIUM in the last 72 hours. No results for input(s): LABPT, INR in the last 72 hours. No results for input(s): LABURIN in the last 72 hours. Results for orders placed or performed during the hospital encounter of 07/31/19  SARS CORONAVIRUS 2 (TAT 6-24 HRS)     Status: None   Collection Time: 07/27/19  1:24 PM  Result Value Ref Range Status   SARS Coronavirus 2 NEGATIVE NEGATIVE Final    Comment: (NOTE) SARS-CoV-2 target nucleic acids are NOT DETECTED. The SARS-CoV-2 RNA is generally detectable in upper and lower respiratory specimens during the acute phase of infection. Negative results do not preclude SARS-CoV-2 infection, do not rule out co-infections with other pathogens, and should not be used as the sole basis for treatment or other patient management decisions. Negative results must be combined with clinical observations, patient history, and epidemiological information. The expected result is Negative. Fact Sheet for Patients: SugarRoll.be Fact Sheet for Healthcare Providers: https://www.woods-mathews.com/ This test is not yet approved or cleared by the [place] FDA and  has been authorized for detection and/or diagnosis of SARS-CoV-2 by FDA under an Emergency Use Authorization (EUA). This EUA will remain  in effect (meaning this test can be used) for the duration of the COVID-19 declaration under Section 56 4(b)(1) of the Act, 21 U.S.C. section 360bbb-3(b)(1), unless the authorization is terminated or revoked sooner. Performed at [facility], [place] [address] [place], [place] [address]    SARS CORONAVIRUS 2 (TAT 6-24 HRS) Nasopharyngeal Nasopharyngeal Swab     Status: None   Collection Time: 08/03/19  9:21 AM   Specimen: Nasopharyngeal Swab  Result Value Ref Range Status   SARS Coronavirus 2 NEGATIVE NEGATIVE Final    Comment: (NOTE) SARS-CoV-2 target nucleic acids are NOT DETECTED. The SARS-CoV-2 RNA is generally detectable in upper and lower respiratory specimens during the acute phase of infection. Negative results do not preclude SARS-CoV-2 infection, do not rule out co-infections with other pathogens, and should not be used as the sole basis for treatment or other patient management decisions. Negative results must be combined with clinical observations,  patient history, and epidemiological information. The expected result is Negative. Fact Sheet for Patients: SugarRoll.be Fact Sheet for Healthcare Providers: https://www.woods-mathews.com/ This test is not yet approved or cleared by the [place] FDA and  has been authorized for detection and/or diagnosis of SARS-CoV-2 by FDA under an Emergency Use Authorization (EUA). This EUA will remain  in effect (meaning this test can be used) for the duration of the COVID-19 declaration under Section 56 4(b)(1) of the Act, 21 U.S.C. section 360bbb-3(b)(1), unless the authorization is terminated or revoked sooner. Performed at [facility], [place] [address], [place], [place] [address]      Assessment: 74 yo woman with right renal mass POD6 s/p right hand-assisted laparoscopic converted to open radical nephrectomy with repair of inferior vena cavotomy. 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   Creatinine stable and now with return of bowel function.  Hemaglobin is stable  JP removed on POD4.   Significant anxiety and concern for not wanting to live or die; psychiatry consulted and no further inpatient needs.   Slow to mobilize although  working with PT, planning for SNF.  Plan: -Multimodal pain control oxycodone (will d/c PO dilaudid and try PO oxycodone now that she is further from surgery, also tylenol for mild pain; limit IV pain meds) -Home Ativan, will discuss dosing with psychiatry who recommended not changing at this time. -Home Requip, Ritalin, Bystolic -Psychiatry consult completed, no interventions or new medications -Montior VS -General diet -Medlock -Bowel regimen; add miralax today -Pathology has been discussed -No indication for antibiotics   -DVT ppx: SCDs and OOB; Lovenox  -Continue PT; PT has recommended SNF; Case manager working on this re [place] number (ready for discharge)   Jacalyn Lefevre, MD Urology 08/06/2019, 7:36 AM

## 2019-08-07 MED ORDER — TRAMADOL HCL 50 MG PO TABS: 50.0000 mg | ORAL_TABLET | Freq: Four times a day (QID) | ORAL | Status: DC | PRN

## 2019-08-07 MED ORDER — TRAMADOL HCL 50 MG PO TABS: 50.0000 mg | ORAL_TABLET | Freq: Four times a day (QID) | ORAL | 0 refills | Status: DC | PRN

## 2019-08-07 NOTE — Progress Notes (Signed)
Urology Inpatient Progress Report    Intv/Subj: No acute events overnight. Patient is still having anxiety.  Awaiting level 2 PASR.    Principal Problem:   Chronic depression not affecting current episode of care Active Problems:   Renal mass   Generalized anxiety disorder   PTSD (post-traumatic stress disorder)  Current Facility-Administered Medications  Medication Dose Route Frequency Provider Last Rate Last Admin  . acetaminophen (TYLENOL) tablet 1,000 mg  1,000 mg Oral Q6H Jacalyn Lefevre D, MD   1,000 mg at 08/07/19 0606  . bisacodyl (DULCOLAX) suppository 10 mg  10 mg Rectal Daily PRN Jacalyn Lefevre D, MD   10 mg at 08/04/19 1838  . Chlorhexidine Gluconate Cloth 2 % PADS 6 each  6 each Topical Daily Robley Fries, MD   6 each at 08/06/19 1009  . diphenhydrAMINE (BENADRYL) injection 12.5 mg  12.5 mg Intravenous Q6H PRN Dancy, Amanda, PA-C       Or  . diphenhydrAMINE (BENADRYL) 12.5 MG/5ML elixir 12.5 mg  12.5 mg Oral Q6H PRN Debbrah Alar, PA-C   12.5 mg at 08/06/19 [id number]  . docusate sodium (COLACE) capsule 100 mg  100 mg Oral BID Debbrah Alar, PA-C   100 mg at 08/06/19 2236  . enoxaparin (LOVENOX) injection 40 mg  40 mg Subcutaneous Q24H Jacalyn Lefevre D, MD   40 mg at 08/06/19 1009  . feeding supplement (ENSURE ENLIVE) (ENSURE ENLIVE) liquid 237 mL  237 mL Oral TID WC Jacalyn Lefevre D, MD   237 mL at 08/05/19 1307  . ferrous sulfate tablet 325 mg  325 mg Oral Q breakfast Debbrah Alar, PA-C   325 mg at 08/04/19 1017  . HYDROmorphone (DILAUDID) injection 0.5-1 mg  0.5-1 mg Intravenous Q2H PRN Debbrah Alar, PA-C   1 mg at 08/06/19 0143  . levothyroxine (SYNTHROID) tablet 75 mcg  75 mcg Oral QAC breakfast Debbrah Alar, PA-C   75 mcg at 08/07/19 [id number]  . lidocaine (LIDODERM) 5 % 1 patch  1 patch Transdermal Q24H Jacalyn Lefevre D, MD      . LORazepam (ATIVAN) tablet 0.5 mg  0.5 mg Oral TID PRN Debbrah Alar, PA-C   0.5 mg at 08/06/19 2236  . methylphenidate (RITALIN) tablet 5  mg  5 mg Oral BID WC Dancy, Amanda, PA-C   5 mg at 08/06/19 0737  . nebivolol (BYSTOLIC) tablet 2.5 mg  2.5 mg Oral Q1500 Jacalyn Lefevre D, MD   2.5 mg at 08/06/19 1009  . ondansetron (ZOFRAN) injection 4 mg  4 mg Intravenous Q4H PRN Debbrah Alar, PA-C   4 mg at 08/07/19 [id number]  . opium-belladonna (B&O) suppository 16.2-60mg   1 suppository Rectal Q6H PRN Dancy, Amanda, PA-C      . oxyCODONE (Oxy IR/ROXICODONE) immediate release tablet 5 mg  5 mg Oral Q4H PRN Jacalyn Lefevre D, MD   5 mg at 08/06/19 1229  . polyethylene glycol (MIRALAX / GLYCOLAX) packet 17 g  17 g Oral Daily Jacalyn Lefevre D, MD   17 g at 08/06/19 1016  . polyvinyl alcohol (LIQUIFILM TEARS) 1.4 % ophthalmic solution 1 drop  1 drop Both Eyes PRN Jacalyn Lefevre D, MD   1 drop at 07/31/19 1850  . rOPINIRole (REQUIP) tablet 0.5 mg  0.5 mg Oral QPC supper Debbrah Alar, PA-C   0.5 mg at 08/06/19 1725  . rOPINIRole (REQUIP) tablet 1 mg  1 mg Oral QHS Jacalyn Lefevre D, MD   1 mg at 08/06/19 2236  . rosuvastatin (CRESTOR) tablet 5 mg  5 mg Oral Daily Debbrah Alar, PA-C   5 mg at 08/04/19 1018  . senna (SENOKOT) tablet 8.6 mg  1 tablet Oral Daily Jacalyn Lefevre D, MD   8.6 mg at 08/06/19 1009  . sodium chloride flush (NS) 0.9 % injection 10-40 mL  10-40 mL Intracatheter Q12H Jacalyn Lefevre D, MD   10 mL at 08/06/19 1002     Objective: Vital: Vitals:   08/06/19 0929 08/06/19 1529 08/06/19 2220 08/07/19 0625  BP: 128/73 126/65 130/69 (!) 143/88  Pulse: 75 67 73 66  Resp: 18 16 18 17   Temp: 98.7 F (37.1 C) 98.6 F (37 C) 98.3 F (36.8 C) 98.5 F (36.9 C)  TempSrc: Oral Oral Oral Oral  SpO2: 100% 100% 96% 100%  Weight:      Height:       I/Os: I/O last 3 completed shifts: In: 1940 [P.O.:1940] Out: [id number] [Urine:1750; Drains:100]  Physical Exam:  General: Patient is in no apparent distress Lungs: Normal respiratory effort, chest expands symmetrically. GI: Incisions are c/d/i with staples and improving ecchymosis, soft,  nt, nd Ext: lower extremities symmetric  Lab Results: Recent Labs    08/05/19 0715  HGB 7.9*  HCT 25.3*   No results for input(s): NA, K, CL, CO2, GLUCOSE, BUN, CREATININE, CALCIUM in the last 72 hours. No results for input(s): LABPT, INR in the last 72 hours. No results for input(s): LABURIN in the last 72 hours. Results for orders placed or performed during the hospital encounter of 07/31/19  SARS CORONAVIRUS 2 (TAT 6-24 HRS)     Status: None   Collection Time: 07/27/19  1:24 PM  Result Value Ref Range Status   SARS Coronavirus 2 NEGATIVE NEGATIVE Final    Comment: (NOTE) SARS-CoV-2 target nucleic acids are NOT DETECTED. The SARS-CoV-2 RNA is generally detectable in upper and lower respiratory specimens during the acute phase of infection. Negative results do not preclude SARS-CoV-2 infection, do not rule out co-infections with other pathogens, and should not be used as the sole basis for treatment or other patient management decisions. Negative results must be combined with clinical observations, patient history, and epidemiological information. The expected result is Negative. Fact Sheet for Patients: SugarRoll.be Fact Sheet for Healthcare Providers: https://www.woods-mathews.com/ This test is not yet approved or cleared by the [place] FDA and  has been authorized for detection and/or diagnosis of SARS-CoV-2 by FDA under an Emergency Use Authorization (EUA). This EUA will remain  in effect (meaning this test can be used) for the duration of the COVID-19 declaration under Section 56 4(b)(1) of the Act, 21 U.S.C. section 360bbb-3(b)(1), unless the authorization is terminated or revoked sooner. Performed at [facility], [place] [address] [place], [place] [address]   SARS CORONAVIRUS 2 (TAT 6-24 HRS) Nasopharyngeal Nasopharyngeal Swab     Status: None   Collection Time: 08/03/19  9:21 AM   Specimen: Nasopharyngeal Swab   Result Value Ref Range Status   SARS Coronavirus 2 NEGATIVE NEGATIVE Final    Comment: (NOTE) SARS-CoV-2 target nucleic acids are NOT DETECTED. The SARS-CoV-2 RNA is generally detectable in upper and lower respiratory specimens during the acute phase of infection. Negative results do not preclude SARS-CoV-2 infection, do not rule out co-infections with other pathogens, and should not be used as the sole basis for treatment or other patient management decisions. Negative results must be combined with clinical observations, patient history, and epidemiological information. The expected result is Negative. Fact Sheet for Patients: SugarRoll.be Fact Sheet  for Healthcare Providers: https://www.woods-mathews.com/ This test is not yet approved or cleared by the [place] and  has been authorized for detection and/or diagnosis of SARS-CoV-2 by FDA under an Emergency Use Authorization (EUA). This EUA will remain  in effect (meaning this test can be used) for the duration of the COVID-19 declaration under Section 56 4(b)(1) of the Act, 21 U.S.C. section 360bbb-3(b)(1), unless the authorization is terminated or revoked sooner. Performed at [facility], [place] [address], [place], [place] [address]     Assessment: 74 yo woman with right renal mass POD6s/p right hand-assisted laparoscopic converted to open radical nephrectomy with repair of inferior vena cavotomy. 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   Creatinine stable and now with return of bowel function.  Hemaglobin isstable  JP removed on POD4.  Significant anxiety and concern for not wanting to live or die; psychiatry consultedand no further inpatient needs.  Slow to mobilize although working with PT, planning for SNF.  Patient has been ready for discharge from surgical perspective for a number of days.   Plan: -Multimodal pain control  oxycodone and tylenol -Home Ativan, will discuss dosing with psychiatrywho recommended not changing at this time. -Home Requip, Ritalin, Bystolic -Psychiatry consultcompleted, no interventions or new medications -Montior VS -General diet -Medlock -Bowel regimen; continue miralax today -Pathology has been discussed -No indication for antibiotics  -DVT ppx: SCDs and OOB; Lovenox  -ContinuePT; PT has recommended SNF; Case manager working on [employer] number (ready for discharge)     Jacalyn Lefevre, MD Urology 08/07/2019, 6:45 AM

## 2019-08-07 NOTE — Discharge Summary (Signed)
Date of admission: 07/31/2019  Date of discharge: 08/07/2019  Admission diagnosis: right renal mass  Discharge diagnosis: right renal mass  Secondary diagnoses:  Patient Active Problem List   Diagnosis Date Noted  . Pure hypercholesterolemia 02/18/2019  . Moderate bipolar I disorder, most recent episode depressed ([place])   . MDD (major depressive disorder), severe ([place]) 05/25/2018  . PONV (postoperative nausea and vomiting)   . Migraines   . History of pleural effusion   . History of attention deficit disorder   . History of endometriosis   . GERD (gastroesophageal reflux disease)   . Chronic depression not affecting current episode of care   . Chronic kidney disease   . CAD (coronary artery disease)   . Bipolar 1 disorder ([place])   . Anxiety   . Adverse effect of general anesthetic   . Acute vestibular neuronitis   . Abscess of Bartholin's gland   . ADHD (attention deficit hyperactivity disorder)   . Memory difficulty 01/26/2017  . Vertigo 01/26/2017  . Hyperlipidemia LDL goal <70 06/24/2016  . GAD (generalized anxiety disorder) 06/03/2015  . PTSD (post-traumatic stress disorder) 01/01/2014  . Bipolar I disorder, most recent episode depressed ([place]) 01/01/2014  . Major depressive disorder, recurrent severe without psychotic features ([place]) 09/25/2013  . PVC's (premature ventricular contractions) 05/31/2013  . Diastolic dysfunction, left ventricle 05/31/2013  . PAT (paroxysmal atrial tachycardia) ([place]) 05/31/2013  . Essential hypertension 05/31/2013  . Obesity 05/31/2013  . H/O echocardiogram 07/20/2012  . Generalized anxiety disorder 06/30/2012  . Renal mass 02/15/2012  . GALLSTONES 12/31/2009  . ATTENTION DEFICIT DISORDER 02/20/2007  . Obstructive sleep apnea 02/20/2007  . RESTLESS LEGS SYNDROME 02/20/2007  . Hypothyroidism 03/22/1988    Procedures performed: Procedure(s): HAND ASSISTED LAPAROSCOPIC NEPHRECTOMY CONVERTED TO OPEN WITH REPAIR OF INFERIOR VENA  CAVAOTOMY  History and Physical: For full details, please see admission history and physical. Briefly, [name] is a 74 y.o. year old patient with right renal mass concerning for renal cell carcinoma who underwent a right hand-assisted laparoscopic converted to open radical nephrectomy with repair of inferior vena cavotomy on 07/31/2019.   Hospital Course: Patient tolerated the procedure well.  She was then transferred to the floor after an uneventful PACU stay.  Her hospital course was uncomplicated.  On POD#5 she had met discharge criteria: was eating a regular diet, was up and ambulating independently,  pain was well controlled, was voiding without a catheter, and was ready to for discharge.  Her discharged was pending to SNF and level 2 PASR and insurance authorization were approved on POD#7.     Laboratory values:  Recent Labs    08/05/19 0715  HGB 7.9*  HCT 25.3*   No results for input(s): NA, K, CL, CO2, GLUCOSE, BUN, CREATININE, CALCIUM in the last 72 hours. No results for input(s): LABPT, INR in the last 72 hours. No results for input(s): LABURIN in the last 72 hours. Results for orders placed or performed during the hospital encounter of 07/31/19  SARS CORONAVIRUS 2 (TAT 6-24 HRS)     Status: None   Collection Time: 07/27/19  1:24 PM  Result Value Ref Range Status   SARS Coronavirus 2 NEGATIVE NEGATIVE Final    Comment: (NOTE) SARS-CoV-2 target nucleic acids are NOT DETECTED. The SARS-CoV-2 RNA is generally detectable in upper and lower respiratory specimens during the acute phase of infection. Negative results do not preclude SARS-CoV-2 infection, do not rule out co-infections with other pathogens, and should not be used as  the sole basis for treatment or other patient management decisions. Negative results must be combined with clinical observations, patient history, and epidemiological information. The expected result is Negative. Fact Sheet for  Patients: SugarRoll.be Fact Sheet for Healthcare Providers: https://www.woods-mathews.com/ This test is not yet approved or cleared by the [place] FDA and  has been authorized for detection and/or diagnosis of SARS-CoV-2 by FDA under an Emergency Use Authorization (EUA). This EUA will remain  in effect (meaning this test can be used) for the duration of the COVID-19 declaration under Section 56 4(b)(1) of the Act, 21 U.S.C. section 360bbb-3(b)(1), unless the authorization is terminated or revoked sooner. Performed at [facility], [place] [address] [place], [place] [address]   SARS CORONAVIRUS 2 (TAT 6-24 HRS) Nasopharyngeal Nasopharyngeal Swab     Status: None   Collection Time: 08/03/19  9:21 AM   Specimen: Nasopharyngeal Swab  Result Value Ref Range Status   SARS Coronavirus 2 NEGATIVE NEGATIVE Final    Comment: (NOTE) SARS-CoV-2 target nucleic acids are NOT DETECTED. The SARS-CoV-2 RNA is generally detectable in upper and lower respiratory specimens during the acute phase of infection. Negative results do not preclude SARS-CoV-2 infection, do not rule out co-infections with other pathogens, and should not be used as the sole basis for treatment or other patient management decisions. Negative results must be combined with clinical observations, patient history, and epidemiological information. The expected result is Negative. Fact Sheet for Patients: SugarRoll.be Fact Sheet for Healthcare Providers: https://www.woods-mathews.com/ This test is not yet approved or cleared by the [place] FDA and  has been authorized for detection and/or diagnosis of SARS-CoV-2 by FDA under an Emergency Use Authorization (EUA). This EUA will remain  in effect (meaning this test can be used) for the duration of the COVID-19 declaration under Section 56 4(b)(1) of the Act, 21 U.S.C. section  360bbb-3(b)(1), unless the authorization is terminated or revoked sooner. Performed at [facility], [place] [address] [place], [place] [address]     Disposition: Home  Discharge instruction: The patient was instructed to be ambulatory but told to refrain from heavy lifting, strenuous activity, or driving.   Discharge medications:  Allergies as of 08/07/2019      Reactions   Geodon [ziprasidone Hydrochloride] Other (See Comments)   Extremely agitated   Lithium Nausea Only, Other (See Comments)   Off balance, increased heart rate   Talwin [pentazocine] Other (See Comments)   Hallucinations    Toradol [ketorolac Tromethamine] Other (See Comments)   Chest pains   Ziprasidone Other (See Comments)   Extremely agitated   Abilify [aripiprazole] Other (See Comments)   jerking   Compazine [prochlorperazine Edisylate] Nausea And Vomiting   Latuda [lurasidone Hcl] Other (See Comments)   Reports made her mind race more and irritable    Pristiq [desvenlafaxine Succinate Er] Other (See Comments)   Did not work, prefers not to take   Rexulti [brexpiprazole] Swelling   Elevated BP, created aggression   Diflucan [fluconazole] Nausea And Vomiting      Medication List    TAKE these medications   aspirin EC 81 MG tablet Take 81 mg by mouth every evening.   b complex vitamins tablet Take 1 tablet by mouth daily.   BLUE GEL EX Apply 1 application topically every 6 (six) hours as needed (restless legs). BLUE ICE PAIN 3% Menthol   Bystolic 2.5 MG tablet Generic drug: nebivolol Take 2.5 mg by mouth daily in the afternoon.   diphenhydrAMINE  25 mg capsule Commonly known as: BENADRYL Take 25 mg by mouth every 6 (six) hours as needed for allergies.   docusate sodium 100 MG capsule Commonly known as: COLACE Take 1 capsule (100 mg total) by mouth 2 (two) times daily.   ferrous sulfate 325 (65 FE) MG tablet Take 325 mg by mouth daily in the afternoon.   fluticasone 50 MCG/ACT nasal  spray Commonly known as: FLONASE Place 2 sprays into both nostrils daily.   GINGER PO Take 1 Dose by mouth 4 (four) times daily as needed (nausea/vomiting). Candied Ginger   HYDROmorphone 2 MG tablet Commonly known as: DILAUDID Take 1 tablet (2 mg total) by mouth every 6 (six) hours as needed for severe pain.   ibuprofen 200 MG tablet Commonly known as: ADVIL Take 400 mg by mouth every 8 (eight) hours as needed (for pain.).   levothyroxine 75 MCG tablet Commonly known as: SYNTHROID Take 75 mcg by mouth daily before breakfast.   LORazepam 0.5 MG tablet Commonly known as: ATIVAN Take 1 tablet (0.5 mg total) by mouth 3 (three) times daily as needed for anxiety.   losartan-hydrochlorothiazide 50-12.5 MG tablet Commonly known as: HYZAAR TAKE 1 TABLET BY MOUTH DAILY. What changed:   how much to take  how to take this  when to take this  additional instructions   magnesium gluconate 500 MG tablet Commonly known as: MAGONATE Take 500 mg by mouth daily.   melatonin 5 MG Tabs Take 5 mg by mouth at bedtime.   methylphenidate 5 MG tablet Commonly known as: Ritalin Take 1 tablet (5 mg total) by mouth 2 (two) times daily with breakfast and lunch.   methylphenidate 5 MG tablet Commonly known as: Ritalin Take 1 tablet (5 mg total) by mouth 2 (two) times daily with breakfast and lunch.   Omega 3 1000 MG Caps Take 1,000 mg by mouth daily.   ondansetron 8 MG tablet Commonly known as: ZOFRAN Take 8 mg by mouth every 8 (eight) hours as needed for nausea or vomiting.   PARoxetine 10 MG tablet Commonly known as: PAXIL TAKE 1 TABLET BY MOUTH EVERY DAY   rOPINIRole 0.5 MG tablet Commonly known as: REQUIP CAN TAKE 1 TABLET WITH DINNER, CAN TAKE 2 TABLETS 1- 2 HOURS BEFORE BEDTIME. What changed: See the new instructions.   rosuvastatin 5 MG tablet Commonly known as: CRESTOR Take 1 tablet (5 mg total) by mouth daily.   traMADol 50 MG tablet Commonly known as: Ultram Take  1 tablet (50 mg total) by mouth every 6 (six) hours as needed for severe pain.   Vitamin D (Cholecalciferol) 50 MCG (2000 UT) Caps Take 4,000 Units by mouth daily.       Followup:   Contact information for follow-up providers    Robley Fries, MD On 08/14/2019.   Specialty: Urology Why: at 2:30 Contact information: [address] [place] [address] [phone]            Contact information for after-discharge care    Destination    [facility] .   Service: Skilled Nursing Contact information: [address] [phone]

## 2019-08-07 NOTE — Discharge Instructions (Signed)
1.  Activity:  You are encouraged to ambulate frequently (about every hour during waking hours) to help prevent blood clots from forming in your legs or lungs.  However, you should not engage in any heavy lifting (> 10-15 lbs), strenuous activity, or straining. 2. Diet: You should advance your diet as instructed by your physician.  It will be normal to have some bloating, nausea, and abdominal discomfort intermittently. 3. Prescriptions:  You will be provided a prescription for pain medication (tramadol) to take as needed.  If your pain is not severe enough to require the prescription pain medication, you may take extra strength Tylenol instead which will have less side effects.  You should also take a prescribed stool softener to avoid straining with bowel movements as the prescription pain medication may constipate you. 4. Incisions: You may remove your dressing bandages 48 hours after surgery if not removed in the hospital.  You will either have some small staples or special tissue glue at each of the incision sites. Once the bandages are removed (if present), the incisions may stay open to air.  You may start showering (but not soaking or bathing in water) the 2nd day after surgery and the incisions simply need to be patted dry after the shower.  No additional care is needed. 5. What to call us about: You should call the office [phone]) if you develop fever > 101 or develop persistent vomiting.   You may resume aspirin, advil, aleve, vitamins, and supplements 7 days after surgery.  DO NOT TAKE dilaudid (hydromorphone).

## 2019-08-07 NOTE — TOC Transition Note (Signed)
Transition of Care [facility]) - CM/SW Discharge Note   Patient Details  Name: [name] MRN: [id number] Date of Birth: 11-28-1945  Transition of Care [facility]) CM/SW Contact:  Lennart Pall, LCSW Phone Number: 08/07/2019, 1:42 PM   Clinical Narrative:    Have received PASRR and insurance auth.  Pt has accepted SNF bed at [facility] and will transfer this afternoon.  MD and nursing aware.   Final next level of care: Skilled Nursing Facility Barriers to Discharge: Barriers Resolved   Patient Goals and CMS Choice Patient states their goals for this hospitalization and ongoing recovery are:: "I want to get stronger and better so that I can continue to make friends." CMS Medicare.gov Compare Post Acute Care list provided to:: Patient Choice offered to / list presented to : Patient  Discharge Placement PASRR number recieved: 08/07/19            Patient chooses bed at: [facility] Patient to be transferred to facility by: [facility]   Patient and family notified of of transfer: 08/07/19  Discharge Plan and Services In-house Referral: NA Discharge Planning Services: NA Post Acute Care Choice: [place]          DME Arranged: N/A DME Agency: NA       HH Arranged: NA HH Agency: NA        Social Determinants of Health (SDOH) Interventions     Readmission Risk Interventions No flowsheet data found.

## 2019-08-09 ENCOUNTER — Other Ambulatory Visit: Payer: Self-pay

## 2019-08-09 ENCOUNTER — Telehealth (INDEPENDENT_AMBULATORY_CARE_PROVIDER_SITE_OTHER): Payer: Medicare Other | Admitting: Psychiatry

## 2019-08-09 DIAGNOSIS — F4310 Post-traumatic stress disorder, unspecified: Secondary | ICD-10-CM | POA: Diagnosis not present

## 2019-08-09 DIAGNOSIS — F3130 Bipolar disorder, current episode depressed, mild or moderate severity, unspecified: Secondary | ICD-10-CM | POA: Diagnosis not present

## 2019-08-09 DIAGNOSIS — F411 Generalized anxiety disorder: Secondary | ICD-10-CM | POA: Diagnosis not present

## 2019-08-09 MED ORDER — METHYLPHENIDATE HCL 5 MG PO TABS: 5.0000 mg | ORAL_TABLET | Freq: Two times a day (BID) | ORAL | 0 refills | Status: DC

## 2019-08-09 MED ORDER — LORAZEPAM 0.5 MG PO TABS: 0.5000 mg | ORAL_TABLET | Freq: Four times a day (QID) | ORAL | 1 refills | Status: DC | PRN

## 2019-08-09 NOTE — Progress Notes (Signed)
Virtual Visit via Telephone Note  I connected with [name] on 08/09/19 at  9:30 AM EDT by telephone and verified that I am speaking with the correct person using two identifiers.  Location: Patient: rehab Provider: office   I discussed the limitations, risks, security and privacy concerns of performing an evaluation and management service by telephone and the availability of in person appointments. I also discussed with the patient that there may be a patient responsible charge related to this service. The patient expressed understanding and agreed to proceed.   History of Present Illness: [name] had surgery recently and is now in rehab for the last 2 days. She has up to 1 month before discharge. She is spending most of her time in her room alone due to physical limitations.. Her depression is ongoing. [name] is experiencing worsening anxiety. She doesn't know where she will go from d/c because she doesn't want to back to her fiance's house. Unfortunately due to her limited finances  [name] can't move right away. Her sleep is poor and her energy is poor. The Ritalin "brightens me, calms, I can concentrate better".  [name] denies any manic or hypomanic like symptoms. She denies SI/HI.    Observations/Objective:  General Appearance: unable to assess  Eye Contact:  unable to assess  Speech:  Clear and Coherent and Normal Rate  Volume:  Normal  Mood:  Anxious and Depressed  Affect:  Congruent  Thought Process:  Goal Directed, Linear and Descriptions of Associations: Intact  Orientation:  Full (Time, Place, and Person)  Thought Content:  Rumination  Suicidal Thoughts:  No  Homicidal Thoughts:  No  Memory:  Immediate;   Good  Judgement:  Fair  Insight:  Fair  Psychomotor Activity: unable to assess  Concentration:  Concentration: Fair  Recall:  [place] of Knowledge:  Good  Language:  Good  Akathisia:  unable to assess  Handed:  Right  AIMS (if indicated):     Assets:   Communication Skills Desire for Improvement Resilience Talents/Skills Vocational/Educational  ADL's:  unable to assess  Cognition:  WNL  Sleep:         Assessment and Plan: Bipolar 1 d/o vs MSS-recurrent, severe without psychotic features; GAD; PTSD  Increase Ativan 0.5mg  po QID for anxiety  Ritalin 5mg  po BID   Follow Up Instructions: In 6-8 weeks or sooner if needed   I discussed the assessment and treatment plan with the patient. The patient was provided an opportunity to ask questions and all were answered. The patient agreed with the plan and demonstrated an understanding of the instructions.   The patient was advised to call back or seek an in-person evaluation if the symptoms worsen or if the condition fails to improve as anticipated.  I provided 25 minutes of non-face-to-face time during this encounter.   [name], MD   **call [phone]  [facility] - give orders to nurse

## 2019-08-10 ENCOUNTER — Telehealth (HOSPITAL_COMMUNITY): Payer: Self-pay | Admitting: *Deleted

## 2019-08-10 NOTE — Telephone Encounter (Signed)
Pt called to continue to ask for change in dosage, higher, of the Lorazepam which is currently ordered @ 0.5mg  q6 hours prn. Please review and advise.

## 2019-08-13 NOTE — Telephone Encounter (Signed)
Pt called requesting a higher dose of her Lorazepam. She is currently on 0.5mg  q 6 hours prn. Last ordered 08/09/19. Please review and advise as I will need to call pt back rgarding next appointment. None on the books currently.

## 2019-08-13 NOTE — Telephone Encounter (Signed)
This is not my patient. Please contact Dr Doyne Keel. Thanks

## 2019-08-21 HISTORY — PX: NEPHRECTOMY RECIPIENT: SUR879

## 2019-08-22 ENCOUNTER — Encounter: Payer: Self-pay | Admitting: Primary Care

## 2019-08-22 ENCOUNTER — Ambulatory Visit (INDEPENDENT_AMBULATORY_CARE_PROVIDER_SITE_OTHER): Payer: Medicare Other | Admitting: Primary Care

## 2019-08-22 ENCOUNTER — Other Ambulatory Visit: Payer: Self-pay

## 2019-08-22 DIAGNOSIS — G2581 Restless legs syndrome: Secondary | ICD-10-CM

## 2019-08-22 NOTE — Patient Instructions (Signed)
Recheck iron levels

## 2019-08-22 NOTE — Progress Notes (Signed)
Virtual Visit via Telephone Note  I connected with [name] on 08/22/19 at  9:30 AM EDT by telephone and verified that I am speaking with the correct person using two identifiers.  Location: Patient: Home Provider: Office I discussed the limitations, risks, security and privacy concerns of performing an evaluation and management service by telephone and the availability of in person appointments. I also discussed with the patient that there may be a patient responsible charge related to this service. The patient expressed understanding and agreed to proceed.   History of Present Illness: 74 year female, never smoked. PMH significant for OSA, restless leg syndrome. Patient of Dr. Elsworth Soho, last seen by pulmonary NP on 07/23/19.   08/22/2019 Patient contacted today for televisit. Having a hard time sleeping d/t restless leg syndrome. Requip does help when she takes it at night. Takes 0.5mg  (1 tablet) at dinner and 1mg  (2 tablets) at bedtime. Think she may be taking it too late at night not giving it enough time to work before going to sleep. She goes to bed at 11pm and takes her medication right before this. She also gets discomfort in her legs and some paresthesia in her feet/ankles during the day. States that she hate to take medication and doesn't want to take anything during the day. She has tried topical pain relief which helps the most. She also takes magnesium supplement. She is not currently take Crestor and she has stopped Prozac. Hgb 7.9 on 08/04/19. Iron 39; FERRITIN 114 in March 2021, advised she take iron supplement for 2 months.   Observations/Objective:  - Able to speak in full sentences; no shortness of breath, wheezing or cough noted   Testing:  CTA 10/31/2018- Upper Abdomen: Visualized upper abdomen is notable for an 8.5 cm right upper pole renal cyst with calcifications and thin septations (series 4/image 84), chronic.  Spirometry 12/2016 Mild restriction  Home Sleep  Study 11/2016>> Moderate OSA with hypopneas causing oxygen desaturations This is not currently being treated due to inability to comply with CPAP therapy. Pt. Is working on weight loss for Inspire device cnsideration  Assessment and Plan:  Restless leg syndrome: - Iron 39; FERRITIN 114 in March 2021, advised she take iron supplement for 2 months - Continue 0.5mg  Requip at dinner (5:30pm) and 1mg  at bedtime (9:30pm) - Continue topical pain cream during the day - Recommend compression stockings and lower leg stretching/massage  - Plan recheck iron levels  - May benefit from trial Neurontin for possible underlying peripheral neuropathy, encouraged patient to discuss with PCP    Follow Up Instructions:  -Follow-up as needed with Dr. Elsworth Soho    I discussed the assessment and treatment plan with the patient. The patient was provided an opportunity to ask questions and all were answered. The patient agreed with the plan and demonstrated an understanding of the instructions.   The patient was advised to call back or seek an in-person evaluation if the symptoms worsen or if the condition fails to improve as anticipated.  I provided 25 minutes of non-face-to-face time during this encounter.   Martyn Ehrich, NP

## 2019-08-23 ENCOUNTER — Ambulatory Visit: Payer: Medicare Other | Admitting: Cardiology

## 2019-09-07 ENCOUNTER — Telehealth: Payer: Self-pay | Admitting: Oncology

## 2019-09-07 NOTE — Telephone Encounter (Signed)
Called pt per 6/17 sch message - unable to reach pt . Left message for pt to call back

## 2019-09-20 ENCOUNTER — Telehealth (HOSPITAL_COMMUNITY): Payer: Self-pay | Admitting: *Deleted

## 2019-09-20 ENCOUNTER — Other Ambulatory Visit (HOSPITAL_COMMUNITY): Payer: Self-pay | Admitting: Psychiatry

## 2019-09-20 ENCOUNTER — Inpatient Hospital Stay: Payer: Medicare Other | Attending: Oncology | Admitting: Oncology

## 2019-09-20 ENCOUNTER — Telehealth: Payer: Self-pay | Admitting: Oncology

## 2019-09-20 DIAGNOSIS — F411 Generalized anxiety disorder: Secondary | ICD-10-CM

## 2019-09-20 DIAGNOSIS — F3130 Bipolar disorder, current episode depressed, mild or moderate severity, unspecified: Secondary | ICD-10-CM

## 2019-09-20 DIAGNOSIS — F4310 Post-traumatic stress disorder, unspecified: Secondary | ICD-10-CM

## 2019-09-20 NOTE — Telephone Encounter (Signed)
Pt called in a request for refill on the Ativan 0.5mg  last ordered on 08/09/19 #90. Pt has an upcoming appointment on 09/27/19. Please review.

## 2019-09-20 NOTE — Telephone Encounter (Signed)
Called pt per 7/1 sch message . Unable reach pt . Left message for patient to call back and reschedule appt.

## 2019-09-24 ENCOUNTER — Ambulatory Visit (HOSPITAL_COMMUNITY)
Admission: EM | Admit: 2019-09-24 | Discharge: 2019-09-24 | Disposition: A | Discharge status: Home / Self Care | Payer: Medicare Other | Attending: Psychiatry | Admitting: Psychiatry

## 2019-09-24 ENCOUNTER — Other Ambulatory Visit: Payer: Self-pay

## 2019-09-24 DIAGNOSIS — F419 Anxiety disorder, unspecified: Secondary | ICD-10-CM | POA: Insufficient documentation

## 2019-09-24 DIAGNOSIS — F322 Major depressive disorder, single episode, severe without psychotic features: Secondary | ICD-10-CM | POA: Diagnosis not present

## 2019-09-24 DIAGNOSIS — F329 Major depressive disorder, single episode, unspecified: Secondary | ICD-10-CM | POA: Insufficient documentation

## 2019-09-24 NOTE — ED Provider Notes (Signed)
Behavioral Health Medical Screening Exam  [name] is a 74 y.o. female.  Total Time spent with patient: 1 hour  Psychiatric Specialty Exam  Presentation  General Appearance:Appropriate for Environment  Eye Contact:Fair  Speech:Clear and Coherent  Speech Volume:Normal  Handedness:No data recorded  Mood and Affect  Mood:Anxious;Depressed  Affect:Tearful   Thought Process  Thought Processes:Coherent  Descriptions of Associations:Intact  Orientation:Full (Time, Place and Person)  Thought Content:Logical  Hallucinations:None  Ideas of Reference:None  Suicidal Thoughts:No  Homicidal Thoughts:No   Sensorium  Memory:Immediate Good;Recent Good;Remote Good  Judgment:Good  Insight:Good   Executive Functions  Concentration:Good  Attention Span:Good  Recall:Good  Fund of Knowledge:Good  Language:Good   Psychomotor Activity  Psychomotor Activity:Normal   Assets  Assets:Desire for Improvement;Communication Skills;Financial Resources/Insurance;Housing;Leisure Time;Resilience;Social Support;Transportation   Sleep  Sleep:Fair  Number of hours: No data recorded  Physical Exam: Physical Exam ROS Blood pressure 120/67, pulse 65, temperature 98.2 F (36.8 C), temperature source Oral, resp. rate 18, height 5\' 4"  (1.626 m), weight 204 lb 8 oz (92.8 kg), SpO2 99 %. Body mass index is 35.1 kg/m.  Musculoskeletal: Strength & Muscle Tone: within normal limits Gait & Station: normal Patient leans: Right   Recommendations:  Based on my evaluation the patient does not appear to have an emergency medical condition.  Waylan Boga, NP 09/24/2019, 6:03 PM

## 2019-09-24 NOTE — ED Notes (Signed)
Patient A&O x 4, ambulatory. Patient discharged in no acute distress. Patient denied SI/HI, A/VH upon discharge. Patient verbalized understanding of all discharge instructions explained by staff, to include follow up appointments, RX's and safety plan. Patient reported mood 10/10. Patient escorted to lobby via staff for transport to home. Safety maintained.

## 2019-09-24 NOTE — Discharge Instructions (Signed)
Follow up with therapy inquiry provided by your insurance company. Make and attend physical therapy appointment. Attend a mental health recovery group. Practice coping skills discussed:  3 good things, walking, hobbies, etc.

## 2019-09-27 ENCOUNTER — Other Ambulatory Visit: Payer: Self-pay

## 2019-09-27 ENCOUNTER — Encounter (HOSPITAL_COMMUNITY): Payer: Self-pay | Admitting: Psychiatry

## 2019-09-27 ENCOUNTER — Telehealth (INDEPENDENT_AMBULATORY_CARE_PROVIDER_SITE_OTHER): Payer: Medicare Other | Admitting: Psychiatry

## 2019-09-27 DIAGNOSIS — F4310 Post-traumatic stress disorder, unspecified: Secondary | ICD-10-CM | POA: Diagnosis not present

## 2019-09-27 DIAGNOSIS — F411 Generalized anxiety disorder: Secondary | ICD-10-CM | POA: Diagnosis not present

## 2019-09-27 DIAGNOSIS — F3130 Bipolar disorder, current episode depressed, mild or moderate severity, unspecified: Secondary | ICD-10-CM

## 2019-09-27 MED ORDER — LORAZEPAM 0.5 MG PO TABS: 0.5000 mg | ORAL_TABLET | Freq: Three times a day (TID) | ORAL | 1 refills | Status: DC | PRN

## 2019-09-27 MED ORDER — DIVALPROEX SODIUM ER 500 MG PO TB24: 500.0000 mg | ORAL_TABLET | Freq: Every day | ORAL | 1 refills | Status: DC

## 2019-09-27 MED ORDER — METHYLPHENIDATE HCL 5 MG PO TABS: 5.0000 mg | ORAL_TABLET | Freq: Two times a day (BID) | ORAL | 0 refills | Status: DC

## 2019-09-27 NOTE — Progress Notes (Signed)
Virtual Visit via Telephone Note  I connected with [name] on 09/27/19 at  9:00 AM EDT by telephone and verified that I am speaking with the correct person using two identifiers.  Location: Patient: home Provider: office   I discussed the limitations, risks, security and privacy concerns of performing an evaluation and management service by telephone and the availability of in person appointments. I also discussed with the patient that there may be a patient responsible charge related to this service. The patient expressed understanding and agreed to proceed.   History of Present Illness: [name] has been feeling terrible since her surgery where her right kidney was removed secondary to cancer. She is having nausea, poor energy and sleep and increased anxiety. [name] feels she is much more aware of her anxiety lately. Her anxiety seems worse.  She ran out of Ativan earlier this week and her PCP gave her enough to last to today's appointment. The Ativan calms her some but doesn't seem as effective. [name] has been having difficulty with recovery. On Monday she went to the new walk in clinic and talked to 2 people. It helped a lot. Yesterday she found a therapist and is looking forward to work with. [name] knows she needs to work on herself and move. She denies manic and hypomanic like symptoms. Her depression is worse. She has low motivation and is crying a lot. The Ritalin helps her feel calm and clear headed. "it is like a salvation to me". She denies SI/HI. Her PTSD is ongoing. Her startle reflex is ongoing. At times she hears her fiance or her daughter calling her name. She denies other AVH. She denies paranoia and ideas of reference. Her appetite is gone and she has lost 10 lbs. She is eating small portions but is trying to make healthy choices.    Observations/Objective:  General Appearance: unable to assess  Eye Contact:  unable to assess  Speech:  Clear and Coherent and Normal Rate   Volume:  Normal  Mood:  Anxious and Depressed  Affect:  Congruent  Thought Process:  Coherent and Descriptions of Associations: Circumstantial  Orientation:  Full (Time, Place, and Person)  Thought Content:  Rumination  Suicidal Thoughts:  No  Homicidal Thoughts:  No  Memory:  Immediate;   Good  Judgement:  Fair  Insight:  Present  Psychomotor Activity: unable to assess  Concentration:  Concentration: Good  Recall:  Good  Fund of Knowledge:  Good  Language:  Good  Akathisia:  unable to assess  Handed:  Right  AIMS (if indicated):     Assets:  Communication Skills Desire for Improvement Financial Resources/Insurance Housing Talents/Skills Transportation Vocational/Educational  ADL's:  unable to assess  Cognition:  WNL  Sleep:         Assessment and Plan:  Bipolar I d/o vs MDD- recurrent, severe without psychotic features; GAD; PTSD   Ativan 0.5mg  po TID prn anxiety  Ritalin 5mg  po BID  Restart Depakote 500mg  po qHS for mood stabilization   D/c Paxil  Follow Up Instructions: In 4-6 weeks or sooner if needed   I discussed the assessment and treatment plan with the patient. The patient was provided an opportunity to ask questions and all were answered. The patient agreed with the plan and demonstrated an understanding of the instructions.   The patient was advised to call back or seek an in-person evaluation if the symptoms worsen or if the condition fails to improve as anticipated.  I provided 30  minutes of non-face-to-face time during this encounter.   [name], MD

## 2019-10-03 ENCOUNTER — Ambulatory Visit: Payer: Medicare Other | Admitting: Cardiology

## 2019-10-10 ENCOUNTER — Telehealth (HOSPITAL_COMMUNITY): Payer: Self-pay

## 2019-10-10 NOTE — Telephone Encounter (Signed)
Patient called and stated that her anxiety is elevated and very high on a daily basis and that she's not feeling good. She stated that she thinks the Ativan is not working anymore and would like something else prescribed. Please review and advise. Thank you.

## 2019-10-11 ENCOUNTER — Telehealth (HOSPITAL_COMMUNITY): Payer: Self-pay | Admitting: Psychiatry

## 2019-10-11 ENCOUNTER — Other Ambulatory Visit: Payer: Self-pay | Admitting: Family Medicine

## 2019-10-11 DIAGNOSIS — M25511 Pain in right shoulder: Secondary | ICD-10-CM

## 2019-10-11 MED ORDER — DIAZEPAM 2 MG PO TABS
2.0000 mg | ORAL_TABLET | Freq: Two times a day (BID) | ORAL | 1 refills | Status: DC | PRN
Start: 2019-10-11 — End: 2019-11-02

## 2019-10-11 NOTE — Telephone Encounter (Signed)
I returned Nelson's call. She states the Ativan is no longer effective. She is feeling anxious, nauseated, with racing thoughts, insomnia. [place] tries to use her coping skills. Nothing is giving her relief and she is unable to relax.   A/P: GAD D/c Ativan  Start trial of Valium 2mg  po BID prn anxiety

## 2019-10-18 ENCOUNTER — Telehealth (HOSPITAL_COMMUNITY): Payer: Self-pay | Admitting: *Deleted

## 2019-10-18 NOTE — Telephone Encounter (Signed)
Patient called stating that Valium did not work for her and she would like to start back on Ativan. She had a panic attack in a store and that has not happened "in a long time."

## 2019-10-19 ENCOUNTER — Other Ambulatory Visit (HOSPITAL_COMMUNITY): Payer: Self-pay | Admitting: Psychiatry

## 2019-10-19 DIAGNOSIS — F3130 Bipolar disorder, current episode depressed, mild or moderate severity, unspecified: Secondary | ICD-10-CM

## 2019-10-23 ENCOUNTER — Telehealth (HOSPITAL_COMMUNITY): Payer: Self-pay | Admitting: *Deleted

## 2019-10-23 NOTE — Telephone Encounter (Signed)
Pt called requesting to go back on the Ativan and d/c the Valium as she says it's not helping her anxiety/panic attacks. Pt has an appointment on 10/25/19. Please review.

## 2019-10-25 ENCOUNTER — Other Ambulatory Visit: Payer: Self-pay

## 2019-10-25 ENCOUNTER — Telehealth (HOSPITAL_COMMUNITY): Payer: Self-pay | Admitting: Psychiatry

## 2019-10-25 ENCOUNTER — Telehealth (HOSPITAL_COMMUNITY): Payer: Medicare Other | Admitting: Psychiatry

## 2019-10-25 NOTE — Telephone Encounter (Signed)
I called [name] at our scheduled appointment time. There was no answer. I left a voice message asking to call to reschedule her appointment when convienent. I was unable to speak with [name] today.

## 2019-11-01 ENCOUNTER — Telehealth: Payer: Medicare Other | Admitting: Cardiology

## 2019-11-02 ENCOUNTER — Other Ambulatory Visit (HOSPITAL_COMMUNITY): Payer: Self-pay | Admitting: Psychiatry

## 2019-11-02 DIAGNOSIS — F411 Generalized anxiety disorder: Secondary | ICD-10-CM

## 2019-11-02 DIAGNOSIS — F4310 Post-traumatic stress disorder, unspecified: Secondary | ICD-10-CM

## 2019-11-02 MED ORDER — LORAZEPAM 0.5 MG PO TABS: 0.5000 mg | ORAL_TABLET | Freq: Two times a day (BID) | ORAL | 0 refills | Status: DC | PRN

## 2019-11-02 NOTE — Progress Notes (Signed)
Valium ineffective. Restart Ativan 0.5mg  BID prn

## 2019-11-09 ENCOUNTER — Telehealth (INDEPENDENT_AMBULATORY_CARE_PROVIDER_SITE_OTHER): Payer: Medicare Other | Admitting: Cardiology

## 2019-11-09 VITALS — BP 135/79 | HR 67 | Ht 64.0 in | Wt 210.0 lb

## 2019-11-09 DIAGNOSIS — I10 Essential (primary) hypertension: Secondary | ICD-10-CM

## 2019-11-09 DIAGNOSIS — I2510 Atherosclerotic heart disease of native coronary artery without angina pectoris: Secondary | ICD-10-CM

## 2019-11-09 DIAGNOSIS — Z7189 Other specified counseling: Secondary | ICD-10-CM | POA: Diagnosis not present

## 2019-11-09 DIAGNOSIS — E7800 Pure hypercholesterolemia, unspecified: Secondary | ICD-10-CM

## 2019-11-09 MED ORDER — ROSUVASTATIN CALCIUM 5 MG PO TABS: 5.0000 mg | ORAL_TABLET | Freq: Every day | ORAL | 3 refills | Status: DC

## 2019-11-09 NOTE — Patient Instructions (Signed)
Medication Instructions:  Restart Rosuvastatin 5 mg daily   *If you need a refill on your cardiac medications before your next appointment, please call your pharmacy*   Lab Work: None   Testing/Procedures: None   Follow-Up: At [facility], you and your health needs are our priority.  As part of our continuing mission to provide you with exceptional heart care, we have created designated Provider Care Teams.  These Care Teams include your primary Cardiologist (physician) and Advanced Practice Providers (APPs -  Physician Assistants and Nurse Practitioners) who all work together to provide you with the care you need, when you need it.  We recommend signing up for the patient portal called "MyChart".  Sign up information is provided on this After Visit Summary.  MyChart is used to connect with patients for Virtual Visits (Telemedicine).  Patients are able to view lab/test results, encounter notes, upcoming appointments, etc.  Non-urgent messages can be sent to your provider as well.   To learn more about what you can do with MyChart, go to [email or web address].    Your next appointment:   6 month(s)  The format for your next appointment:   In Person  Provider:   Buford Dresser, MD

## 2019-11-09 NOTE — Progress Notes (Signed)
Virtual Visit via Telephone Note   This visit type was conducted due to national recommendations for restrictions regarding the COVID-19 Pandemic (e.g. social distancing) in an effort to limit this patient's exposure and mitigate transmission in our community.  Due to her co-morbid illnesses, this patient is at least at moderate risk for complications without adequate follow up.  This format is felt to be most appropriate for this patient at this time.  The patient did not have access to video technology/had technical difficulties with video requiring transitioning to audio format only (telephone).  All issues noted in this document were discussed and addressed.  No physical exam could be performed with this format.  Please refer to the patient's chart for her  consent to telehealth for [facility].   The patient was identified using 2 identifiers.  Patient Location: Home Provider Location: Home Office  Date:  11/09/2019   ID:  Brooke, Frank Sep 01, 1945, MRN [id number]  PCP:  Leighton Ruff, MD  Cardiologist:  Buford Dresser, MD   Referring MD: Leighton Ruff, MD   CC: follow up  History of Present Illness:    [name] is a 74 y.o. female with a hx of PVCs, paroxysmal atrial tachycardia s/p ablation, hypertension, CAD, renal cell carcinoma s/p nephrectomy who is seen for follow up today. I initially met her 02/13/19 as a new patient to me/prior patient of Dr. Radford Pax for the evaluation and management of her above issues.  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 Mass initially found in 2011, recommended for resection given increase in size.  Having a tough time, has not been able to bounce back since surgery. Incision is painful, can't get strength back. Just had a CT yesterday to evaluate further. 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 Hasn't not been sleeping well.  Feels  that every day is a bad day. Goes to physical therapy, trying to get her strength back.   Reviewed medication list today. On aspirin 81 mg, bystolic 2.5 mg, losartan-HCTZ. Stopped taking rosuvastatin, didn't want to be on so many medications. Had heard bad things about statin, so she stopped. We reviewed her CAD history, she is willing to restart.  Blood pressures have been stable. Back on bystolic, no palpitations. Has an exercise bike, does 10 min/day.  Denies chest pain, shortness of breath at rest or with normal exertion. No PND, orthopnea, LE edema or unexpected weight gain. No syncope or palpitations.  Past Medical History:  Diagnosis Date  . Abscess of Bartholin's gland   . Acute vestibular neuronitis    Dr Lucia Gaskins  . ADHD (attention deficit hyperactivity disorder)   . Adverse effect of general anesthetic    felt paralyzed while receiving anesthesia  . Anxiety   . Bipolar 1 disorder ([place])   . CAD (coronary artery disease), native coronary artery    cath 05/2016 showing 50-70% stenosis in the mid LAD proximal to the first diagonal and 70-80% small OM1. FFR of LAD  not performed because of difficulty with catheter control from the right radial.   . Chronic kidney disease    kidney cancer- pt states she has elected to not have it treated.  . Depression    Bipolar disorder/goes to [place] [facility] for meds  . Difficult intubation    told by [facility] that she was hard to intubate 15b yrs ago in [place]- surgery since then no problems  . Dyspnea   .  Dysrhythmia    tachycardia  . GERD (gastroesophageal reflux disease)   . H/O echocardiogram 07/2012   [place] LVF w grade I siastolic dysfunction   . History of attention deficit disorder   . History of endometriosis   . History of pleural effusion   . Hyperlipidemia LDL goal <70 06/24/2016  . Hypertension   . 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 Memory difficulty 01/26/2017  . Migraines   . Obesity   . OSA  (obstructive sleep apnea)    uses oral appliance instead of CPAP  . PAT (paroxysmal atrial tachycardia) (HCC)    s/p ablation  . PONV (postoperative nausea and vomiting)   . PTSD (post-traumatic stress disorder)   . PVC's (premature ventricular contractions)   . Renal mass 02/15/2012  . RLS (restless legs syndrome)    Dr Gwenette Greet  . rt renal ca dx'd 12/2009   no treatment/ no surg  . Vertigo     Past Surgical History:  Procedure Laterality Date  . CARDIAC ELECTROPHYSIOLOGY [place] AND ABLATION  2000s  . LAPAROSCOPIC NEPHRECTOMY, HAND ASSISTED Right 07/31/2019   Procedure: HAND ASSISTED LAPAROSCOPIC NEPHRECTOMY CONVERTED TO OPEN WITH REPAIR OF INFERIOR VENA CAVAOTOMY;  Surgeon: Robley Fries, MD;  Location: [facility];  Service: Urology;  Laterality: Right;  3 HRS  . laparoscopy    . LEFT HEART CATH AND CORONARY ANGIOGRAPHY N/A 06/08/2016   Procedure: Left Heart Cath and Coronary Angiography;  Surgeon: Belva Crome, MD;  Location: [facility];  Service: Cardiovascular;  Laterality: N/A;  . nasoseptal reconstruction  1990s  . NEPHRECTOMY RECIPIENT Right 08/2019   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  . THYROIDECTOMY  1990  . TONSILLECTOMY     as a child  . TUBAL LIGATION  1970s  . uterine mass removal  03/2012   was found to be benign    Current Medications: Current Outpatient Medications on File Prior to Visit  Medication Sig  . aspirin EC 81 MG tablet Take 81 mg by mouth every evening.   Marland Kitchen b complex vitamins tablet Take 1 tablet by mouth daily.  Marland Kitchen BYSTOLIC 2.5 MG tablet Take 2.5 mg by mouth daily in the afternoon.  . diphenhydrAMINE (BENADRYL) 25 mg capsule Take 25 mg by mouth every 6 (six) hours as needed for allergies.  . ferrous sulfate 325 (65 FE) MG tablet Take 325 mg by mouth daily in the afternoon.  . fluticasone (FLONASE) 50 MCG/ACT nasal spray Place 2 sprays into both nostrils daily.  . Ginger, Zingiber officinalis, (GINGER PO) Take 1 Dose by mouth 4 (four) times  daily as needed (nausea/vomiting). Candied Ginger  . ibuprofen (ADVIL) 200 MG tablet Take 400 mg by mouth every 8 (eight) hours as needed (for pain.).  Marland Kitchen levothyroxine (SYNTHROID) 75 MCG tablet Take 75 mcg by mouth daily before breakfast.  . LORazepam (ATIVAN) 0.5 MG tablet Take 1 tablet (0.5 mg total) by mouth 2 (two) times daily as needed for anxiety.  Marland Kitchen losartan-hydrochlorothiazide (HYZAAR) 50-12.5 MG tablet TAKE 1 TABLET BY MOUTH DAILY. (Patient taking differently: Take 1 tablet by mouth daily. )  . magnesium gluconate (MAGONATE) 500 MG tablet Take 500 mg by mouth daily.  . melatonin 5 MG TABS Take 5 mg by mouth at bedtime.  . Menthol, Topical Analgesic, (BLUE GEL EX) Apply 1 application topically every 6 (six) hours as needed (restless legs). BLUE ICE PAIN 3% Menthol  . methylphenidate (RITALIN) 5 MG tablet Take  1 tablet (5 mg total) by mouth 2 (two) times daily with breakfast and lunch.  . methylphenidate (RITALIN) 5 MG tablet Take 1 tablet (5 mg total) by mouth 2 (two) times daily with breakfast and lunch.  . Omega 3 1000 MG CAPS Take 1,000 mg by mouth daily.  . ondansetron (ZOFRAN) 8 MG tablet Take 8 mg by mouth every 8 (eight) hours as needed for nausea or vomiting.   Marland Kitchen rOPINIRole (REQUIP) 0.5 MG tablet CAN TAKE 1 TABLET WITH DINNER, CAN TAKE 2 TABLETS 1- 2 HOURS BEFORE BEDTIME. (Patient taking differently: Take 0.5-1 mg by mouth See admin instructions. Take 1 tablet (0.5 mg) by mouth daily after supper & take 2 tablets (1 mg) by mouth at bedtime.)  . rosuvastatin (CRESTOR) 5 MG tablet Take 1 tablet (5 mg total) by mouth daily. (Patient not taking: Reported on 07/17/2019)  . traMADol (ULTRAM) 50 MG tablet Take 1 tablet (50 mg total) by mouth every 6 (six) hours as needed for severe pain.  . traMADol (ULTRAM) 50 MG tablet Take 1 tablet (50 mg total) by mouth every 6 (six) hours as needed for severe pain.  . Vitamin D, Cholecalciferol, 50 MCG (2000 UT) CAPS Take 4,000 Units by mouth daily.     No current facility-administered medications on file prior to visit.     Allergies:   Geodon [ziprasidone hydrochloride], Lithium, Talwin [pentazocine], Toradol [ketorolac tromethamine], Ziprasidone, Abilify [aripiprazole], Compazine [prochlorperazine edisylate], Latuda [lurasidone hcl], Pristiq [desvenlafaxine succinate er], Rexulti [brexpiprazole], and Diflucan [fluconazole]   Social History   Tobacco Use  . Smoking status: Never Smoker  . Smokeless tobacco: Never Used  Vaping Use  . Vaping Use: Never used  Substance Use Topics  . Alcohol use: Not Currently    Alcohol/week: 1.0 standard drink    Types: 1 Glasses of wine per week  . Drug use: No    Family History: family history includes Alcohol abuse in her father; Allergies in her brother, daughter, father, and sister; Bipolar disorder in her father; CAD in her mother; Cancer in her paternal grandfather; Colon cancer in her paternal grandmother; Depression in her mother; Heart disease in her maternal grandfather and mother; Hypertension in her mother; OCD in her paternal grandmother; Skin cancer in her father.  ROS:   Please see the history of present illness.  Additional pertinent ROS unremarkable except as documented.  EKGs/Labs/Other Studies Reviewed:    The following studies were reviewed today: Cath 06/08/2016  50-70% stenosis in the mid LAD proximal to the first diagonal. FFR not performed because of difficulty with catheter control from the right radial.   75-80% stenosis in the midsegment of the first obtuse marginal.   Luminal irregularities in the mid RCA.   Overall normal LV function with EF percent.   Recommendations:    Aggressive risk factor modification and anti-ischemic therapy ( beta blocker, statin therapy and possibly LA nitrates) in this non-ACS subset. PCI in this setting does not meet appropriate use criteria.   If limiting symptoms on medications or convincing evidence of ischemia, consider  obtuse marginal PCI and/or higher than usual risk PCI on the LAD (due to calcification and angulation).   Lexiscan 09/19/2014  Nuclear stress EF: 70%.  Horizontal ST segment depression ST segment depression of 1 mm was noted during stress in the V5, V6, aVF, II, III and I leads, and returning to baseline after 5-9 minutes of recovery.  The study is normal.  This is a low risk study.  The  left ventricular ejection fraction is hyperdynamic (>65%).   Low risk stress nuclear study with normal perfusion and normal left ventricular regional and global systolic function. Abnormal ECG response may represent a "false positive".  Echo 04/22/2013 - Left ventricle: The cavity size was normal. Systolic  function was normal. The estimated ejection fraction was  in the range of 55% to 60%. Wall motion was normal; there  were no regional wall motion abnormalities. Left  ventricular diastolic function parameters were normal.  - Pulmonary arteries: PA peak pressure: 20mm Hg (S).   EKG:  EKG is personally reviewed.  The ekg ordered 05/23/19 demonstrates NSR  Recent Labs: 02/10/2019: ALT 12 07/24/2019: Platelets 302 08/03/2019: BUN 8; Creatinine, Ser 1.03; Potassium 3.6; Sodium 132 08/05/2019: Hemoglobin 7.9  Recent Lipid Panel    Component Value Date/Time   CHOL 177 05/26/2018 0625   TRIG 68 05/26/2018 0625   HDL 47 05/26/2018 0625   CHOLHDL 3.8 05/26/2018 0625   VLDL 14 05/26/2018 0625   LDLCALC 116 (H) 05/26/2018 0625    Physical Exam:    VS:  BP 135/79   Pulse 67   Ht 5\' 4"  (1.626 m)   Wt 210 lb (95.3 kg)   BMI 36.05 kg/m     Wt Readings from Last 3 Encounters:  11/09/19 210 lb (95.3 kg)  07/31/19 212 lb (96.2 kg)  07/24/19 212 lb (96.2 kg)    Speaking comfortably on the phone, no audible wheezing In no acute distress Alert and oriented Normal affect Normal speech  ASSESSMENT:    1. Coronary artery disease involving native coronary artery of native heart without angina  pectoris   2. Pure hypercholesterolemia   3. Essential hypertension   4. Cardiac risk counseling   5. Counseling on health promotion and disease prevention    PLAN:    Coronary disease by cath 2018, exercise intolerance -based on cath in 2018, would aim for medical management as much as tolerated. If not improved on optimal medical management, then consider cath/PCI. -continue aspirin 81 mg daily -statin as below -beta blocker as below  Hypercholesterolemia: LDL goal <70, last was 116 on 05/26/18 -was tolerating 5 mg rosuvastatin. Has a history of prior intolerances -she stopped rosuvastatin after surgery but is willing to restart, reordered today  Hypertension:  -continue losartan-HCTZ 50-12.5 mg daily -back on bystolic 2.5 mg daily, tapering/withdrawal did not help her symptoms  Cardiac risk counseling and prevention recommendations: -recommend heart healthy/Mediterranean diet, with whole grains, fruits, vegetable, fish, lean meats, nuts, and olive oil. Limit salt. -recommend moderate walking, 3-5 times/week for 30-50 minutes each session. Aim for at least 150 minutes.week. Goal should be pace of 3 miles/hours, or walking 1.5 miles in 30 minutes -recommend avoidance of tobacco products. Avoid excess alcohol.  Plan for follow up: 6 mos  Today, I have spent 15 minutes with the patient with telehealth technology discussing the above problems.  Additional time spent in chart review, documentation, and communication.  07/26/18, MD, PhD [place]  [facility]   Medication Adjustments/Labs and Tests Ordered: Current medicines are reviewed at length with the patient today.  Concerns regarding medicines are outlined above.  No orders of the defined types were placed in this encounter.  Meds ordered this encounter  Medications  . rosuvastatin (CRESTOR) 5 MG tablet    Sig: Take 1 tablet (5 mg total) by mouth daily.    Dispense:  90 tablet    Refill:  3    Patient  Instructions  Medication Instructions:  Restart Rosuvastatin 5 mg daily   *If you need a refill on your cardiac medications before your next appointment, please call your pharmacy*   Lab Work: None   Testing/Procedures: None   Follow-Up: At [facility], you and your health needs are our priority.  As part of our continuing mission to provide you with exceptional heart care, we have created designated Provider Care Teams.  These Care Teams include your primary Cardiologist (physician) and Advanced Practice Providers (APPs -  Physician Assistants and Nurse Practitioners) who all work together to provide you with the care you need, when you need it.  We recommend signing up for the patient portal called "MyChart".  Sign up information is provided on this After Visit Summary.  MyChart is used to connect with patients for Virtual Visits (Telemedicine).  Patients are able to view lab/test results, encounter notes, upcoming appointments, etc.  Non-urgent messages can be sent to your provider as well.   To learn more about what you can do with MyChart, go to [email or web address].    Your next appointment:   6 month(s)  The format for your next appointment:   In Person  Provider:   Buford Dresser, MD      Signed, Buford Dresser, MD PhD 11/09/2019  [place]

## 2019-11-11 ENCOUNTER — Ambulatory Visit
Admission: RE | Admit: 2019-11-11 | Discharge: 2019-11-11 | Disposition: A | Discharge status: Home / Self Care | Payer: Medicare Other | Source: Ambulatory Visit | Attending: Family Medicine | Admitting: Family Medicine

## 2019-11-11 DIAGNOSIS — M25511 Pain in right shoulder: Secondary | ICD-10-CM

## 2019-12-01 ENCOUNTER — Other Ambulatory Visit (HOSPITAL_COMMUNITY): Payer: Self-pay | Admitting: Psychiatry

## 2019-12-01 DIAGNOSIS — F4310 Post-traumatic stress disorder, unspecified: Secondary | ICD-10-CM

## 2019-12-01 DIAGNOSIS — F3130 Bipolar disorder, current episode depressed, mild or moderate severity, unspecified: Secondary | ICD-10-CM

## 2019-12-11 ENCOUNTER — Telehealth: Payer: Self-pay | Admitting: Cardiology

## 2019-12-11 ENCOUNTER — Telehealth: Payer: Self-pay | Admitting: Pulmonary Disease

## 2019-12-11 DIAGNOSIS — G2581 Restless legs syndrome: Secondary | ICD-10-CM

## 2019-12-11 MED ORDER — ROPINIROLE HCL 0.5 MG PO TABS
0.5000 mg | ORAL_TABLET | ORAL | 1 refills | Status: DC
Start: 2019-12-11 — End: 2020-01-22

## 2019-12-11 NOTE — Telephone Encounter (Signed)
° ° °  Pt c/o medication issue:  1. Name of Medication:   BYSTOLIC 2.5 MG tablet   2. How are you currently taking this medication (dosage and times per day)? Take 2.5 mg by mouth daily in the afternoon.  3. Are you having a reaction (difficulty breathing--STAT)?   4. What is your medication issue? Pt said for the last couple of days her BP [place] high but her pulse is in the 50s, she wanted to ask Dr. Harrell Gave if she need to continue taking her bystolic since her pulse is running low

## 2019-12-11 NOTE — Telephone Encounter (Signed)
Requip was refilled  I have called and left a detailed msg letting the pt know ok per [facility]

## 2019-12-11 NOTE — Telephone Encounter (Signed)
I spoke with [name] and she reports that her BP has been running 155/88 and 143/83 over the last two days.  With her heart rate 50 she is asking if she should continue with the bystolic as per the previous message. Please advise.

## 2019-12-17 NOTE — Telephone Encounter (Signed)
As long as she is feeling fine, a pulse in the 50s is acceptable. Thanks.

## 2019-12-17 NOTE — Telephone Encounter (Signed)
Left message to call back  

## 2019-12-21 NOTE — Telephone Encounter (Signed)
Attempted to contact pt x 2. Left message to call back 

## 2019-12-21 NOTE — Telephone Encounter (Signed)
Tried to CB pt, mailbox is full

## 2019-12-21 NOTE — Telephone Encounter (Signed)
Patient is returning call.  °

## 2020-01-02 ENCOUNTER — Inpatient Hospital Stay: Payer: Medicare Other | Attending: Oncology | Admitting: Oncology

## 2020-01-02 ENCOUNTER — Other Ambulatory Visit: Payer: Self-pay

## 2020-01-02 ENCOUNTER — Ambulatory Visit: Payer: Medicare Other | Admitting: Pulmonary Disease

## 2020-01-02 VITALS — BP 106/52 | HR 64 | Temp 97.1°F | Resp 18 | Wt 215.4 lb

## 2020-01-02 DIAGNOSIS — G8929 Other chronic pain: Secondary | ICD-10-CM | POA: Diagnosis not present

## 2020-01-02 DIAGNOSIS — N2889 Other specified disorders of kidney and ureter: Secondary | ICD-10-CM

## 2020-01-02 DIAGNOSIS — C641 Malignant neoplasm of right kidney, except renal pelvis: Secondary | ICD-10-CM | POA: Insufficient documentation

## 2020-01-02 DIAGNOSIS — Z905 Acquired absence of kidney: Secondary | ICD-10-CM | POA: Insufficient documentation

## 2020-01-02 DIAGNOSIS — R5383 Other fatigue: Secondary | ICD-10-CM | POA: Diagnosis not present

## 2020-01-02 NOTE — Progress Notes (Signed)
Hematology and Oncology Follow Up Visit  [name] [id number] 1946-02-22 74 y.o. 01/02/2020 3:56 PM  CC: [name] (C.Alan) Harrington Challenger, M.D.    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  Prior therapy: She is status post right radical nephrectomy completed by Dr. Claudia Desanctis on Jul 31, 2019.  Final pathology showed a 6 cm clear-cell renal cell carcinoma.  Current therapy: Active surveillance.  Interim History:  [name] returns today for a repeat evaluation.  Since the last visit, she underwent right nephrectomy and tolerated the procedure well.  The final pathology showed a clear cell nuclear grade 4 with extension into the renal sinus fat and negative margins.  Since her surgery, she is recovering slowly at this time with chronic pain and fatigue that is improving slowly.  She has resumed most activities of daily living including driving and continues to live independently.  She denies any hematochezia melena.  She denies any hematuria or dysuria.     Medications: Unchanged on review. Current Outpatient Medications  Medication Sig Dispense Refill  . aspirin EC 81 MG tablet Take 81 mg by mouth every evening.     Marland Kitchen b complex vitamins tablet Take 1 tablet by mouth daily.    Marland Kitchen BYSTOLIC 2.5 MG tablet Take 2.5 mg by mouth daily in the afternoon.    . diphenhydrAMINE (BENADRYL) 25 mg capsule Take 25 mg by mouth every 6 (six) hours as needed for allergies.    . ferrous sulfate 325 (65 FE) MG tablet Take 325 mg by mouth daily in the afternoon.    . fluticasone (FLONASE) 50 MCG/ACT nasal spray Place 2 sprays into both nostrils as needed.     . Ginger, Zingiber officinalis, (GINGER PO) Take 1 Dose by mouth 4 (four) times daily as needed (nausea/vomiting). Candied Ginger    . ibuprofen (ADVIL) 200 MG tablet Take 400 mg by mouth every 8 (eight) hours as needed (for pain.).     Marland Kitchen levothyroxine (SYNTHROID) 75 MCG tablet Take 75 mcg by mouth daily before breakfast.    . LORazepam (ATIVAN) 0.5 MG tablet TAKE 1 TABLET (0.5 MG TOTAL) BY MOUTH 3 (THREE) TIMES DAILY AS NEEDED FOR ANXIETY. 90 tablet 0  . losartan-hydrochlorothiazide (HYZAAR) 50-12.5 MG tablet Take 1 tablet by mouth daily.    . Menthol, Topical Analgesic, (BLUE GEL EX) Apply 1 application topically every 6 (six) hours as needed (restless legs). BLUE ICE PAIN 3% Menthol    . methylphenidate (RITALIN) 5 MG tablet Take 1 tablet (5 mg total) by mouth 2 (two) times daily with breakfast and lunch. 60 tablet 0  . Omega 3 1000 MG CAPS Take 1,000 mg by mouth daily.    . ondansetron (ZOFRAN) 8 MG tablet Take 8 mg by mouth every 8 (eight) hours as needed for nausea or vomiting.     . polyethylene glycol powder (MIRALAX) 17 GM/SCOOP powder Take 1 Container by mouth daily.    Marland Kitchen rOPINIRole (REQUIP) 0.5 MG tablet Take 1-2 tablets (0.5-1 mg total) by mouth See admin instructions. Take 1 tablet (0.5 mg) by mouth daily after supper & take 2 tablets (1 mg) by mouth at bedtime. 90 tablet 1  . rosuvastatin (CRESTOR) 5 MG tablet Take 1 tablet (5 mg total) by mouth daily. 90 tablet 3  . Vitamin D, Cholecalciferol, 50 MCG (2000 UT) CAPS Take 4,000 Units by mouth daily.  No current facility-administered medications for this visit.    Allergies:  Allergies  Allergen Reactions  . Geodon [Ziprasidone Hydrochloride] Other (See Comments)    Extremely agitated  . Lithium Nausea Only and Other (See Comments)    Off balance, increased heart rate  . Talwin [Pentazocine] Other (See Comments)    Hallucinations   . Toradol [Ketorolac Tromethamine] Other (See Comments)    Chest pains  . Ziprasidone Other (See Comments)    Extremely agitated  . Abilify [Aripiprazole] Other (See Comments)    jerking  . Compazine [Prochlorperazine Edisylate] Nausea And Vomiting  . Latuda [Lurasidone Hcl] Other (See Comments)    Reports made her mind  race more and irritable   . Pristiq [Desvenlafaxine Succinate Er] Other (See Comments)    Did not work, prefers not to take  . Rexulti [Brexpiprazole] Swelling    Elevated BP, created aggression  . Diflucan [Fluconazole] Nausea And Vomiting     Physical Exam:  Blood pressure (!) 106/52, pulse 64, temperature (!) 97.1 F (36.2 C), temperature source Tympanic, resp. rate 18, weight 215 lb 6.4 oz (97.7 kg), SpO2 100 %.    ECOG: 1    General appearance: Comfortable appearing without any discomfort Head: Normocephalic without any trauma Oropharynx: Mucous membranes are moist and pink without any thrush or ulcers. Eyes: Pupils are equal and round reactive to light. Lymph nodes: No cervical, supraclavicular, inguinal or axillary lymphadenopathy.   Heart:regular rate and rhythm.  S1 and S2 without leg edema. Lung: Clear without any rhonchi or wheezes.  No dullness to percussion. Abdomin: Soft, nontender, nondistended with good bowel sounds.  No hepatosplenomegaly. Musculoskeletal: No joint deformity or effusion.  Full range of motion noted. Neurological: No deficits noted on motor, sensory and deep tendon reflex exam. Skin: No petechial rash or dryness.  Appeared moist.      Lab Results: Lab Results  Component Value Date   WBC 5.7 07/24/2019   HGB 7.9 (L) 08/05/2019   HCT 25.3 (L) 08/05/2019   MCV 83.4 07/24/2019   PLT 302 07/24/2019     Chemistry      Component Value Date/Time   NA 132 (L) 08/03/2019 0456   NA 144 05/11/2016 1450   NA 139 02/18/2015 1440   K 3.6 08/03/2019 0456   K 4.2 02/18/2015 1440   CL 100 08/03/2019 0456   CL 103 07/21/2012 1415   CO2 26 08/03/2019 0456   CO2 29 02/18/2015 1440   BUN 8 08/03/2019 0456   BUN 21 05/11/2016 1450   BUN 19.1 02/18/2015 1440   CREATININE 1.03 (H) 08/03/2019 0456   CREATININE 0.9 02/18/2015 1440      Component Value Date/Time   CALCIUM 8.1 (L) 08/03/2019 0456   CALCIUM 10.2 02/18/2015 1440   ALKPHOS 81  02/10/2019 1410   ALKPHOS 89 02/18/2015 1440   AST 64 (H) 02/10/2019 1410   AST 20 02/18/2015 1440   ALT 12 02/10/2019 1410   ALT 16 02/18/2015 1440   BILITOT 1.5 (H) 02/10/2019 1410   BILITOT 0.37 02/18/2015 5476      74 year old woman with:  1.  Clear-cell renal cell carcinoma diagnosed after radical nephrectomy on Jul 31, 2019.  He presented with kidney mass in 2011.  The natural course of her disease was reviewed at this time.  Was found to have T3a tumor that was resected with excellent negative margins.  Her tumor has grown very slowly over the last 10 years with risk of relapse or spread  of metastatic disease remains low but certainly a possibility.  I recommended active surveillance moving forward without any additional therapy.  The role of adjuvant immunotherapy was discussed and at this time not indicated.  Benefits are outweighed by potential complications.  She is currently receiving active surveillance under the care of Dr. Claudia Desanctis.  Imaging studies obtained in August and September 2021 were reviewed and showed evidence of relapsed disease.  2.  Follow-up: She will return in 6 months for repeat follow-up.  30  minutes were dedicated to this visit. The time was spent on reviewing her disease status, pathology results discussing treatment options, and answering questions regarding future plan. Zola Button 10/13/20213:56 PM

## 2020-01-04 ENCOUNTER — Other Ambulatory Visit: Payer: Self-pay | Admitting: Pulmonary Disease

## 2020-01-04 DIAGNOSIS — G2581 Restless legs syndrome: Secondary | ICD-10-CM

## 2020-01-06 ENCOUNTER — Encounter: Payer: Self-pay | Admitting: Cardiology

## 2020-01-08 ENCOUNTER — Other Ambulatory Visit: Payer: Self-pay | Admitting: Pulmonary Disease

## 2020-01-08 DIAGNOSIS — G2581 Restless legs syndrome: Secondary | ICD-10-CM

## 2020-01-08 NOTE — Telephone Encounter (Signed)
RA pt is requesting a refill of the requip.  This was last filled on 12/11/19 for #90.  Please advise if ok to refill. Thanks

## 2020-01-10 ENCOUNTER — Ambulatory Visit: Payer: Medicare Other | Admitting: Pulmonary Disease

## 2020-01-20 ENCOUNTER — Emergency Department (HOSPITAL_COMMUNITY)
Admission: EM | Admit: 2020-01-20 | Discharge: 2020-01-21 | Disposition: A | Discharge status: Home / Self Care | Payer: Medicare Other | Attending: Emergency Medicine | Admitting: Emergency Medicine

## 2020-01-20 ENCOUNTER — Other Ambulatory Visit: Payer: Self-pay

## 2020-01-20 ENCOUNTER — Encounter (HOSPITAL_COMMUNITY): Payer: Self-pay | Admitting: Emergency Medicine

## 2020-01-20 DIAGNOSIS — R42 Dizziness and giddiness: Secondary | ICD-10-CM | POA: Diagnosis not present

## 2020-01-20 DIAGNOSIS — R5381 Other malaise: Secondary | ICD-10-CM | POA: Diagnosis present

## 2020-01-20 DIAGNOSIS — Z20822 Contact with and (suspected) exposure to covid-19: Secondary | ICD-10-CM | POA: Diagnosis not present

## 2020-01-20 DIAGNOSIS — E039 Hypothyroidism, unspecified: Secondary | ICD-10-CM | POA: Diagnosis not present

## 2020-01-20 DIAGNOSIS — Z7982 Long term (current) use of aspirin: Secondary | ICD-10-CM | POA: Diagnosis not present

## 2020-01-20 DIAGNOSIS — I129 Hypertensive chronic kidney disease with stage 1 through stage 4 chronic kidney disease, or unspecified chronic kidney disease: Secondary | ICD-10-CM | POA: Insufficient documentation

## 2020-01-20 DIAGNOSIS — I2510 Atherosclerotic heart disease of native coronary artery without angina pectoris: Secondary | ICD-10-CM | POA: Insufficient documentation

## 2020-01-20 DIAGNOSIS — N189 Chronic kidney disease, unspecified: Secondary | ICD-10-CM | POA: Diagnosis not present

## 2020-01-20 DIAGNOSIS — Z79899 Other long term (current) drug therapy: Secondary | ICD-10-CM | POA: Insufficient documentation

## 2020-01-20 DIAGNOSIS — R1031 Right lower quadrant pain: Secondary | ICD-10-CM | POA: Insufficient documentation

## 2020-01-20 DIAGNOSIS — M545 Low back pain, unspecified: Secondary | ICD-10-CM | POA: Diagnosis not present

## 2020-01-20 DIAGNOSIS — Z955 Presence of coronary angioplasty implant and graft: Secondary | ICD-10-CM | POA: Diagnosis not present

## 2020-01-20 LAB — URINALYSIS, ROUTINE W REFLEX MICROSCOPIC
Bilirubin Urine: NEGATIVE
Glucose, UA: NEGATIVE mg/dL
Hgb urine dipstick: NEGATIVE
Ketones, ur: NEGATIVE mg/dL
Leukocytes,Ua: NEGATIVE
Nitrite: NEGATIVE
Protein, ur: NEGATIVE mg/dL
Specific Gravity, Urine: 1.011 (ref 1.005–1.030)
pH: 5 (ref 5.0–8.0)

## 2020-01-20 LAB — CBC WITH DIFFERENTIAL/PLATELET
Abs Immature Granulocytes: 0.02 10*3/uL (ref 0.00–0.07)
Basophils Absolute: 0 10*3/uL (ref 0.0–0.1)
Basophils Relative: 0 %
Eosinophils Absolute: 0 10*3/uL (ref 0.0–0.5)
Eosinophils Relative: 0 %
HCT: 38 % (ref 36.0–46.0)
Hemoglobin: 12.2 g/dL (ref 12.0–15.0)
Immature Granulocytes: 0 %
Lymphocytes Relative: 16 %
Lymphs Abs: 1.3 10*3/uL (ref 0.7–4.0)
MCH: 27.7 pg (ref 26.0–34.0)
MCHC: 32.1 g/dL (ref 30.0–36.0)
MCV: 86.4 fL (ref 80.0–100.0)
Monocytes Absolute: 0.4 10*3/uL (ref 0.1–1.0)
Monocytes Relative: 5 %
Neutro Abs: 6.1 10*3/uL (ref 1.7–7.7)
Neutrophils Relative %: 79 %
Platelets: 214 10*3/uL (ref 150–400)
RBC: 4.4 MIL/uL (ref 3.87–5.11)
RDW: 15 % (ref 11.5–15.5)
WBC: 7.8 10*3/uL (ref 4.0–10.5)
nRBC: 0 % (ref 0.0–0.2)

## 2020-01-20 LAB — CBG MONITORING, ED: Glucose-Capillary: 92 mg/dL (ref 70–99)

## 2020-01-20 LAB — BASIC METABOLIC PANEL
Anion gap: 11 (ref 5–15)
BUN: 36 mg/dL — ABNORMAL HIGH (ref 8–23)
CO2: 23 mmol/L (ref 22–32)
Calcium: 9.8 mg/dL (ref 8.9–10.3)
Chloride: 103 mmol/L (ref 98–111)
Creatinine, Ser: 1.19 mg/dL — ABNORMAL HIGH (ref 0.44–1.00)
GFR, Estimated: 48 mL/min — ABNORMAL LOW (ref >60)
Glucose, Bld: 95 mg/dL (ref 70–99)
Potassium: 3.9 mmol/L (ref 3.5–5.1)
Sodium: 137 mmol/L (ref 135–145)

## 2020-01-20 MED ORDER — DIAZEPAM 5 MG/ML IJ SOLN
2.5000 mg | Freq: Once | INTRAMUSCULAR | Status: AC
  Administered 2020-01-21: 2.5 mg via INTRAVENOUS
  Filled 2020-01-20: qty 2

## 2020-01-20 MED ORDER — ONDANSETRON HCL 4 MG/2ML IJ SOLN
4.0000 mg | Freq: Once | INTRAMUSCULAR | Status: AC
  Administered 2020-01-21: 4 mg via INTRAVENOUS
  Filled 2020-01-20: qty 2

## 2020-01-20 NOTE — ED Triage Notes (Signed)
Pt BIB [facility] stating that she doesn't feel well. States that her BP has been high initially 170/102, took a Losartan and now is 150/60s. Reports some nausea and dizziness. No syncope. CBG 104.

## 2020-01-20 NOTE — ED Provider Notes (Signed)
[facility] DEPT Provider Note: Georgena Spurling, MD, FACEP  CSN: [id number] MRN: [id number] ARRIVAL: 01/20/20 at 2137 ROOM: [place] PRESENT ILLNESS  01/20/20 11:03 PM [name] is a 74 y.o. female who came by ambulance because she does not feel well but has difficulty explaining what that means.  Her blood pressure had earlier been 170/102 but she took a losartan and her blood pressure came down to 150s over 60s with a heart rate as low as 49.  She has had some nausea and dizziness (vertigo, a sensation of spinning inside of her head, worse with movement of her head) but no syncope.  Her blood pressure here is 151/79.  Her CBG is 104 and her initial heart rate was 54.   She is also having back pain for the past 3 days.  The pain is in her lower back and is moderate in severity.  It is worse with being up and around and she states that she has to rest about every hour because the pain prevents her from being active.  She is also having pain in her right lower quadrant at site of a recent nephrectomy.   Past Medical History:  Diagnosis Date  . Abscess of Bartholin's gland   . Acute vestibular neuronitis    Dr Lucia Gaskins  . ADHD (attention deficit hyperactivity disorder)   . Adverse effect of general anesthetic    felt paralyzed while receiving anesthesia  . Anxiety   . Bipolar 1 disorder ([place])   . CAD (coronary artery disease), native coronary artery    cath 05/2016 showing 50-70% stenosis in the mid LAD proximal to the first diagonal and 70-80% small OM1. FFR of LAD  not performed because of difficulty with catheter control from the right radial.   . Chronic kidney disease    kidney cancer- pt states she has elected to not have it treated.  . Depression    Bipolar disorder/goes to [facility] for meds  . Difficult intubation    told by [facility] that she was hard to intubate 15b yrs ago in [place]- surgery since then no problems  . Dyspnea    . Dysrhythmia    tachycardia  . GERD (gastroesophageal reflux disease)   . H/O echocardiogram 07/2012   [place] LVF w grade I siastolic dysfunction   . History of attention deficit disorder   . History of endometriosis   . History of pleural effusion   . Hyperlipidemia LDL goal <70 06/24/2016  . Hypertension   . 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 Memory difficulty 01/26/2017  . Migraines   . Obesity   . OSA (obstructive sleep apnea)    uses oral appliance instead of CPAP  . PAT (paroxysmal atrial tachycardia) (HCC)    s/p ablation  . PONV (postoperative nausea and vomiting)   . PTSD (post-traumatic stress disorder)   . PVC's (premature ventricular contractions)   . Renal mass 02/15/2012  . RLS (restless legs syndrome)    Dr Gwenette Greet  . rt renal ca dx'd 12/2009   no treatment/ no surg  . Vertigo     Past Surgical History:  Procedure Laterality Date  . CARDIAC ELECTROPHYSIOLOGY [place] AND ABLATION  2000s  . LAPAROSCOPIC NEPHRECTOMY, HAND ASSISTED Right 07/31/2019   Procedure: HAND ASSISTED LAPAROSCOPIC NEPHRECTOMY CONVERTED TO OPEN WITH REPAIR OF INFERIOR VENA CAVAOTOMY;  Surgeon: Robley Fries, MD;  Location: [facility];  Service: Urology;  Laterality: Right;  3 HRS  . laparoscopy    . LEFT HEART CATH AND CORONARY ANGIOGRAPHY N/A 06/08/2016   Procedure: Left Heart Cath and Coronary Angiography;  Surgeon: Belva Crome, MD;  Location: [facility];  Service: Cardiovascular;  Laterality: N/A;  . nasoseptal reconstruction  1990s  . NEPHRECTOMY RECIPIENT Right 08/2019   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  . THYROIDECTOMY  1990  . TONSILLECTOMY     as a child  . TUBAL LIGATION  1970s  . uterine mass removal  03/2012   was found to be benign    Family History  Problem Relation Age of Onset  . Allergies Father   . Skin cancer Father   . Bipolar disorder Father   . Alcohol abuse Father   . Heart disease Mother   . Depression Mother     . Hypertension Mother   . CAD Mother   . Colon cancer Paternal Grandmother   . OCD Paternal Grandmother   . Allergies Sister        multiple  . Allergies Brother        multiple  . Allergies Daughter   . Heart disease Maternal Grandfather   . Cancer Paternal Grandfather     Social History   Tobacco Use  . Smoking status: Never Smoker  . Smokeless tobacco: Never Used  Vaping Use  . Vaping Use: Never used  Substance Use Topics  . Alcohol use: Not Currently    Alcohol/week: 1.0 standard drink    Types: 1 Glasses of wine per week  . Drug use: No    Prior to Admission medications   Medication Sig Start Date End Date Taking? Authorizing Provider  aspirin EC 81 MG tablet Take 81 mg by mouth every evening.     [provider]  b complex vitamins tablet Take 1 tablet by mouth daily.    [provider]  BYSTOLIC 2.5 MG tablet Take 2.5 mg by mouth daily in the afternoon. 06/11/19   [provider]  diazepam (VALIUM) 5 MG tablet Take 1 tablet (5 mg total) by mouth every 6 (six) hours as needed (vertigo). 01/21/20   Dequann Vandervelden, MD  diphenhydrAMINE (BENADRYL) 25 mg capsule Take 25 mg by mouth every 6 (six) hours as needed for allergies.    [provider]  ferrous sulfate 325 (65 FE) MG tablet Take 325 mg by mouth daily in the afternoon.    [provider]  fluticasone (FLONASE) 50 MCG/ACT nasal spray Place 2 sprays into both nostrils as needed.  03/02/19   [provider]  Ginger, Zingiber officinalis, (GINGER PO) Take 1 Dose by mouth 4 (four) times daily as needed (nausea/vomiting). Candied Ginger    [provider]  ibuprofen (ADVIL) 200 MG tablet Take 400 mg by mouth every 8 (eight) hours as needed (for pain.).    [provider]  levothyroxine (SYNTHROID) 75 MCG tablet Take 75 mcg by mouth daily before breakfast. 06/29/19   [provider]  LORazepam (ATIVAN) 0.5 MG tablet TAKE 1 TABLET (0.5 MG TOTAL) BY  MOUTH 3 (THREE) TIMES DAILY AS NEEDED FOR ANXIETY. 12/10/19   [name], MD  losartan-hydrochlorothiazide [facility]) 50-12.5 MG tablet Take 1 tablet by mouth daily.    [provider]  Menthol, Topical Analgesic, (BLUE GEL EX) Apply 1 application topically every 6 (six) hours as needed (restless legs). BLUE ICE PAIN 3% Menthol    [provider]  methylphenidate (RITALIN) 5 MG tablet Take 1 tablet (5 mg total) by mouth 2 (two) times daily with breakfast and lunch. 09/27/19 12/28/19  [name], MD  Omega 3 1000 MG CAPS Take 1,000 mg by mouth daily.    [provider]  ondansetron (ZOFRAN) 8 MG tablet Take 8 mg by mouth every 8 (eight) hours as needed for nausea or vomiting.  01/03/19   [provider]  polyethylene glycol powder (MIRALAX) 17 GM/SCOOP powder Take 1 Container by mouth daily.    [provider]  rOPINIRole (REQUIP) 0.5 MG tablet Take 1-2 tablets (0.5-1 mg total) by mouth See admin instructions. Take 1 tablet (0.5 mg) by mouth daily after supper & take 2 tablets (1 mg) by mouth at bedtime. 12/11/19   Rigoberto Noel, MD  rosuvastatin (CRESTOR) 5 MG tablet Take 1 tablet (5 mg total) by mouth daily. 11/09/19 02/07/20  Buford Dresser, MD  Vitamin D, Cholecalciferol, 50 MCG (2000 UT) CAPS Take 4,000 Units by mouth daily.     [provider]    Allergies Geodon [ziprasidone hydrochloride], Lithium, Talwin [pentazocine], Toradol [ketorolac tromethamine], Ziprasidone, Abilify [aripiprazole], Compazine [prochlorperazine edisylate], Latuda [lurasidone hcl], Pristiq [desvenlafaxine succinate er], Rexulti [brexpiprazole], and Diflucan [fluconazole]   REVIEW OF SYSTEMS  Negative except as noted here or in the History of Present Illness.   PHYSICAL EXAMINATION  Initial Vital Signs Blood pressure (!) 151/79, pulse (!) 54, temperature 98 F (36.7 C), temperature source Oral, resp. rate 18, SpO2 97 %.  Examination General:  Well-developed, well-nourished female in no acute distress; appearance consistent with age of record HENT: normocephalic; atraumatic Eyes: pupils equal, round and reactive to light; extraocular muscles intact; no nystagmus Neck: supple Heart: regular rate and rhythm; bradycardia Lungs: clear to auscultation bilaterally Abdomen: soft; nondistended; mild right lower quadrant tenderness; well-healing surgical scars; bowel sounds present Back: Pain on movement of lower back Extremities: No deformity; full range of motion; pulses normal Neurologic: Awake, alert and oriented; motor function intact in all extremities and symmetric; no facial droop; sudden movements reproduce patient's vertigo but without nystagmus Skin: Warm and dry Psychiatric: Flat affect   RESULTS  Summary of this visit's results, reviewed and interpreted by myself:   EKG Interpretation  Date/Time:  Sunday January 20 2020 21:54:05 EDT Ventricular Rate:  49 PR Interval:    QRS Duration: 108 QT Interval:  455 QTC Calculation: 411 R Axis:   47 Text Interpretation: Sinus bradycardia Rate is slower Confirmed by Trenton Passow [phone]) on 01/20/2020 10:50:41 PM      Laboratory Studies: Results for orders placed or performed during the hospital encounter of 01/20/20 (from the past 24 hour(s))  CBG monitoring, ED     Status: None   Collection Time: 01/20/20 10:25 PM  Result Value Ref Range   Glucose-Capillary 92 70 - 99 mg/dL  Basic metabolic panel     Status: Abnormal   Collection Time: 01/20/20 10:26 PM  Result Value Ref Range   Sodium 137 135 - 145 mmol/L   Potassium 3.9 3.5 - 5.1 mmol/L   Chloride 103 98 - 111 mmol/L   CO2 23 22 - 32 mmol/L   Glucose, Bld 95 70 - 99 mg/dL   BUN 36 (H) 8 - 23 mg/dL   Creatinine, Ser 1.19 (H) 0.44 - 1.00 mg/dL   Calcium 9.8 8.9 - 10.3 mg/dL   GFR, Estimated 48 (L) >60 mL/min   Anion gap 11 5 - 15  Urinalysis, Routine w reflex microscopic  Status: Abnormal   Collection Time:  01/20/20 10:30 PM  Result Value Ref Range   Color, Urine STRAW (A) YELLOW   APPearance CLEAR CLEAR   Specific Gravity, Urine 1.011 1.005 - 1.030   pH 5.0 5.0 - 8.0   Glucose, UA NEGATIVE NEGATIVE mg/dL   Hgb urine dipstick NEGATIVE NEGATIVE   Bilirubin Urine NEGATIVE NEGATIVE   Ketones, ur NEGATIVE NEGATIVE mg/dL   Protein, ur NEGATIVE NEGATIVE mg/dL   Nitrite NEGATIVE NEGATIVE   Leukocytes,Ua NEGATIVE NEGATIVE  CBC with Differential/Platelet     Status: None   Collection Time: 01/20/20 10:50 PM  Result Value Ref Range   WBC 7.8 4.0 - 10.5 K/uL   RBC 4.40 3.87 - 5.11 MIL/uL   Hemoglobin 12.2 12.0 - 15.0 g/dL   HCT 38.0 36 - 46 %   MCV 86.4 80.0 - 100.0 fL   MCH 27.7 26.0 - 34.0 pg   MCHC 32.1 30.0 - 36.0 g/dL   RDW 15.0 11.5 - 15.5 %   Platelets 214 150 - 400 K/uL   nRBC 0.0 0.0 - 0.2 %   Neutrophils Relative % 79 %   Neutro Abs 6.1 1.7 - 7.7 K/uL   Lymphocytes Relative 16 %   Lymphs Abs 1.3 0.7 - 4.0 K/uL   Monocytes Relative 5 %   Monocytes Absolute 0.4 0.1 - 1.0 K/uL   Eosinophils Relative 0 %   Eosinophils Absolute 0.0 0.0 - 0.5 K/uL   Basophils Relative 0 %   Basophils Absolute 0.0 0.0 - 0.1 K/uL   Immature Granulocytes 0 %   Abs Immature Granulocytes 0.02 0.00 - 0.07 K/uL  Respiratory Panel by RT PCR (Flu A&B, Covid) - Nasopharyngeal Swab     Status: None   Collection Time: 01/21/20  1:00 AM   Specimen: Nasopharyngeal Swab  Result Value Ref Range   SARS Coronavirus 2 by RT PCR NEGATIVE NEGATIVE   Influenza A by PCR NEGATIVE NEGATIVE   Influenza B by PCR NEGATIVE NEGATIVE   Imaging Studies: No results found.  ED COURSE and MDM  Nursing notes, initial and subsequent vitals signs, including pulse oximetry, reviewed and interpreted by myself.  Vitals:   01/20/20 2315 01/21/20 0004 01/21/20 0100 01/21/20 0151  BP: (!) 159/132 135/67 110/87 138/68  Pulse: (!) 46 (!) 45 (!) 51 (!) 56  Resp: 13 13 16 18   Temp:      TempSrc:      SpO2: 100% 99% 100% 97%    Medications  diazepam (VALIUM) injection 2.5 mg (2.5 mg Intravenous Given 01/21/20 0003)  ondansetron (ZOFRAN) injection 4 mg (4 mg Intravenous Given 01/21/20 0002)   2:38 AM Patient's vertigo improved (6 out of 10 down from 10 out of 10) after IV Valium.  Will will treat with oral Valium.  Laboratory studies and EKG otherwise unremarkable.   PROCEDURES  Procedures   ED DIAGNOSES     ICD-10-CM   1. Vertigo  R42        Fifi Schindler, MD 01/21/20 [phone]

## 2020-01-20 NOTE — ED Notes (Signed)
CBG: 92 mg/dL. Results are not yet crossing over into the chart.

## 2020-01-21 LAB — RESPIRATORY PANEL BY RT PCR (FLU A&B, COVID)
Influenza A by PCR: NEGATIVE
Influenza B by PCR: NEGATIVE
SARS Coronavirus 2 by RT PCR: NEGATIVE

## 2020-01-21 MED ORDER — DIAZEPAM 5 MG PO TABS: 5.0000 mg | ORAL_TABLET | Freq: Four times a day (QID) | ORAL | 0 refills | Status: DC | PRN

## 2020-01-21 NOTE — ED Notes (Signed)
Pt stood on the side of the bed and took a few steps. She states that her dizziness has improved but it is still present

## 2020-02-13 ENCOUNTER — Institutional Professional Consult (permissible substitution): Payer: Medicare Other | Admitting: Neurology

## 2020-02-13 ENCOUNTER — Telehealth: Payer: Self-pay | Admitting: Neurology

## 2020-02-13 NOTE — Telephone Encounter (Signed)
This patient called only minutes before her new patient appointment today and cancel.  This will count as a new patient no-show.

## 2020-02-22 ENCOUNTER — Encounter: Payer: Self-pay | Admitting: Pulmonary Disease

## 2020-02-22 ENCOUNTER — Ambulatory Visit (INDEPENDENT_AMBULATORY_CARE_PROVIDER_SITE_OTHER): Payer: Medicare Other | Admitting: Pulmonary Disease

## 2020-02-22 ENCOUNTER — Other Ambulatory Visit: Payer: Self-pay

## 2020-02-22 DIAGNOSIS — R053 Chronic cough: Secondary | ICD-10-CM | POA: Diagnosis not present

## 2020-02-22 DIAGNOSIS — G2581 Restless legs syndrome: Secondary | ICD-10-CM

## 2020-02-22 DIAGNOSIS — G4733 Obstructive sleep apnea (adult) (pediatric): Secondary | ICD-10-CM | POA: Diagnosis not present

## 2020-02-22 HISTORY — DX: Chronic cough: R05.3

## 2020-02-22 NOTE — Patient Instructions (Signed)
OTC Delsym 5 ml twice daily as needed 10lb weight loss recommended to qualify for INSPIRE device for sleep apnea

## 2020-02-22 NOTE — Assessment & Plan Note (Signed)
Okay to take Delsym over-the-counter. Unclear cause but may be related to postnasal drip.  She will also have the ductwork looked at in her house heating system

## 2020-02-22 NOTE — Assessment & Plan Note (Signed)
She has tried CPAP at least twice in the last 5 years and has failed both times due to poor tolerance.  She has also tried this once in the remote past.  As such I do not feel that we should be trying CPAP again.  She has not tolerated dental appliance either unfortunately. Her BMI currently contraindicates inspire implant device we discussed this in detail.  If she is able to lose 10 to 15 pounds, then she may be a a candidate for this device with BMI less than 32.  We would then reassess her degree of sleep disordered breathing by a home sleep test

## 2020-02-22 NOTE — Assessment & Plan Note (Signed)
Appears well controlled on Requip currently 1 mg nightly

## 2020-02-22 NOTE — Progress Notes (Signed)
   Subjective:    Patient ID: [name], female    DOB: June 20, 1945, 74 y.o.   MRN: [id number]  HPI  74 yo yowoman for FU of restless leg syndrome andOSA  She has bipolar disorder and anxiety -was on antidepressants for more than 30 years 11/2016AHI was worse compared to 2004, she was started on CPAP of 9 cm but had poor compliance and this was discontinued by DME  She underwent repeat home sleep study 11/2016 which showed AHI of 12/hour andwas started back on CPAP-but again unable to tolerate She has usedDental appliance in thepast but did not tolerate this well  Chief Complaint  Patient presents with  . Follow-up    Patient states that it was recommended for her to go back on CPAP and would like to try it again, breathing is good   She continues to battle with depression, tearful during the interview.  She discussed with her psychiatrist and it was felt that some of her fatigue may be related to OSA and for her to trial CPAP again.  We reviewed her prior experience with CPAP which has not been good she has not tolerated this at all. Restless legs are better controlled on 2 tablets of Requip every night. She is back on Prozac 40. She complains of increased fatigue. She also reports a dry cough-and wonders if this is related to allergies.  She is having the ductwork looked at in her house Denies obvious heartburn or postnasal drip     Significant tests/ events reviewed  NPSG 2004: AHI 10/hr PSG 07/2015 (214 lbs) AHI 23/h, mild PLMs 23/h but no sig arousals  CPAP titration 09/2015 9 cm  HST 11/2016 AHI 12/h, 7h TST  10/2018Spirometry-poor effort ,ratio 68, FEV1 of 67% and FVC of 75% suggesting mild obstruction  Review of Systems neg for any significant sore throat, dysphagia, itching, sneezing, nasal congestion or excess/ purulent secretions, fever, chills, sweats, unintended wt loss, pleuritic or exertional cp, hempoptysis, orthopnea pnd or change in  chronic leg swelling. Also denies presyncope, palpitations, heartburn, abdominal pain, nausea, vomiting, diarrhea or change in bowel or urinary habits, dysuria,hematuria, rash, arthralgias, visual complaints, headache, numbness weakness or ataxia.     Objective:   Physical Exam  Gen. Pleasant, obese, in no distress ENT - no lesions, no post nasal drip Neck: No JVD, no thyromegaly, no carotid bruits Lungs: no use of accessory muscles, no dullness to percussion, decreased without rales or rhonchi  Cardiovascular: Rhythm regular, heart sounds  normal, no murmurs or gallops, no peripheral edema Musculoskeletal: No deformities, no cyanosis or clubbing , no tremors       Assessment & Plan:  f

## 2020-03-12 ENCOUNTER — Other Ambulatory Visit: Payer: Self-pay | Admitting: Family Medicine

## 2020-03-12 DIAGNOSIS — Z1231 Encounter for screening mammogram for malignant neoplasm of breast: Secondary | ICD-10-CM

## 2020-03-12 DIAGNOSIS — E2839 Other primary ovarian failure: Secondary | ICD-10-CM

## 2020-03-19 ENCOUNTER — Other Ambulatory Visit: Payer: Self-pay | Admitting: Family Medicine

## 2020-03-19 ENCOUNTER — Other Ambulatory Visit: Payer: Self-pay | Admitting: Cardiology

## 2020-03-19 DIAGNOSIS — Z85528 Personal history of other malignant neoplasm of kidney: Secondary | ICD-10-CM

## 2020-03-19 DIAGNOSIS — R2681 Unsteadiness on feet: Secondary | ICD-10-CM

## 2020-03-24 ENCOUNTER — Telehealth: Payer: Self-pay | Admitting: Neurology

## 2020-03-24 ENCOUNTER — Institutional Professional Consult (permissible substitution): Payer: Medicare Other | Admitting: Neurology

## 2020-03-24 ENCOUNTER — Encounter: Payer: Self-pay | Admitting: Neurology

## 2020-03-24 NOTE — Telephone Encounter (Signed)
This is the second revisit no-show for this patient.

## 2020-03-25 ENCOUNTER — Encounter: Payer: Self-pay | Admitting: Neurology

## 2020-04-09 DIAGNOSIS — R5383 Other fatigue: Secondary | ICD-10-CM | POA: Diagnosis not present

## 2020-04-09 DIAGNOSIS — R110 Nausea: Secondary | ICD-10-CM | POA: Diagnosis not present

## 2020-04-09 DIAGNOSIS — Z20822 Contact with and (suspected) exposure to covid-19: Secondary | ICD-10-CM | POA: Diagnosis not present

## 2020-04-09 DIAGNOSIS — R519 Headache, unspecified: Secondary | ICD-10-CM | POA: Diagnosis not present

## 2020-04-15 ENCOUNTER — Telehealth: Payer: Self-pay | Admitting: Pulmonary Disease

## 2020-04-15 DIAGNOSIS — G43011 Migraine without aura, intractable, with status migrainosus: Secondary | ICD-10-CM | POA: Diagnosis not present

## 2020-04-15 DIAGNOSIS — G2581 Restless legs syndrome: Secondary | ICD-10-CM

## 2020-04-15 NOTE — Telephone Encounter (Signed)
Attempted to call pt. Left message for pt to call back.  

## 2020-04-16 MED ORDER — ROPINIROLE HCL 0.5 MG PO TABS: ORAL_TABLET | ORAL | 1 refills | Status: DC

## 2020-04-16 NOTE — Telephone Encounter (Signed)
Patient is returning phone call. Patient phone number is [phone].

## 2020-04-16 NOTE — Telephone Encounter (Signed)
Spoke with patient. She was requesting a refill on her ropinirole 0.5mg  to be sent to [facility] on [address]. Advised patient that I would go ahead and send in a RX for her. She verbalized understanding. She is aware to call us back if she has any problems with getting her medication from the pharmacy.   Nothing further needed at time of call.

## 2020-04-22 ENCOUNTER — Other Ambulatory Visit (HOSPITAL_COMMUNITY): Payer: Self-pay | Admitting: Psychiatry

## 2020-04-23 DIAGNOSIS — R202 Paresthesia of skin: Secondary | ICD-10-CM | POA: Diagnosis not present

## 2020-04-23 DIAGNOSIS — R109 Unspecified abdominal pain: Secondary | ICD-10-CM | POA: Diagnosis not present

## 2020-04-23 DIAGNOSIS — E039 Hypothyroidism, unspecified: Secondary | ICD-10-CM | POA: Diagnosis not present

## 2020-05-01 ENCOUNTER — Encounter: Payer: Self-pay | Admitting: Psychology

## 2020-05-01 DIAGNOSIS — I10 Essential (primary) hypertension: Secondary | ICD-10-CM | POA: Diagnosis not present

## 2020-05-01 DIAGNOSIS — E7800 Pure hypercholesterolemia, unspecified: Secondary | ICD-10-CM | POA: Diagnosis not present

## 2020-05-01 DIAGNOSIS — G4700 Insomnia, unspecified: Secondary | ICD-10-CM | POA: Diagnosis not present

## 2020-05-01 DIAGNOSIS — I2510 Atherosclerotic heart disease of native coronary artery without angina pectoris: Secondary | ICD-10-CM | POA: Diagnosis not present

## 2020-05-01 DIAGNOSIS — E039 Hypothyroidism, unspecified: Secondary | ICD-10-CM | POA: Diagnosis not present

## 2020-05-02 ENCOUNTER — Ambulatory Visit (HOSPITAL_COMMUNITY)
Admission: RE | Admit: 2020-05-02 | Discharge: 2020-05-02 | Disposition: A | Discharge status: Home / Self Care | Payer: Medicare Other | Attending: Psychiatry | Admitting: Psychiatry

## 2020-05-02 DIAGNOSIS — Z743 Need for continuous supervision: Secondary | ICD-10-CM | POA: Diagnosis not present

## 2020-05-02 DIAGNOSIS — R079 Chest pain, unspecified: Secondary | ICD-10-CM | POA: Diagnosis not present

## 2020-05-02 DIAGNOSIS — R0789 Other chest pain: Secondary | ICD-10-CM | POA: Diagnosis not present

## 2020-05-05 ENCOUNTER — Inpatient Hospital Stay: Admission: RE | Admit: 2020-05-05 | Payer: Medicare Other | Source: Ambulatory Visit

## 2020-05-06 DIAGNOSIS — C641 Malignant neoplasm of right kidney, except renal pelvis: Secondary | ICD-10-CM | POA: Diagnosis not present

## 2020-05-07 DIAGNOSIS — C649 Malignant neoplasm of unspecified kidney, except renal pelvis: Secondary | ICD-10-CM | POA: Diagnosis not present

## 2020-05-07 DIAGNOSIS — Z0389 Encounter for observation for other suspected diseases and conditions ruled out: Secondary | ICD-10-CM | POA: Diagnosis not present

## 2020-05-07 DIAGNOSIS — N281 Cyst of kidney, acquired: Secondary | ICD-10-CM | POA: Diagnosis not present

## 2020-05-07 DIAGNOSIS — C641 Malignant neoplasm of right kidney, except renal pelvis: Secondary | ICD-10-CM | POA: Diagnosis not present

## 2020-05-12 DIAGNOSIS — J0101 Acute recurrent maxillary sinusitis: Secondary | ICD-10-CM | POA: Diagnosis not present

## 2020-05-12 DIAGNOSIS — R059 Cough, unspecified: Secondary | ICD-10-CM | POA: Diagnosis not present

## 2020-05-12 DIAGNOSIS — J3089 Other allergic rhinitis: Secondary | ICD-10-CM | POA: Diagnosis not present

## 2020-05-13 ENCOUNTER — Encounter (HOSPITAL_COMMUNITY): Payer: Self-pay

## 2020-05-13 ENCOUNTER — Emergency Department (HOSPITAL_COMMUNITY)
Admission: EM | Admit: 2020-05-13 | Discharge: 2020-05-14 | Disposition: A | Discharge status: Home / Self Care | Payer: Medicare Other | Attending: Emergency Medicine | Admitting: Emergency Medicine

## 2020-05-13 ENCOUNTER — Emergency Department (HOSPITAL_COMMUNITY): Payer: Medicare Other

## 2020-05-13 DIAGNOSIS — I130 Hypertensive heart and chronic kidney disease with heart failure and stage 1 through stage 4 chronic kidney disease, or unspecified chronic kidney disease: Secondary | ICD-10-CM | POA: Diagnosis not present

## 2020-05-13 DIAGNOSIS — I5030 Unspecified diastolic (congestive) heart failure: Secondary | ICD-10-CM | POA: Insufficient documentation

## 2020-05-13 DIAGNOSIS — K219 Gastro-esophageal reflux disease without esophagitis: Secondary | ICD-10-CM | POA: Diagnosis not present

## 2020-05-13 DIAGNOSIS — R1011 Right upper quadrant pain: Secondary | ICD-10-CM | POA: Diagnosis not present

## 2020-05-13 DIAGNOSIS — E039 Hypothyroidism, unspecified: Secondary | ICD-10-CM | POA: Diagnosis not present

## 2020-05-13 DIAGNOSIS — Z7982 Long term (current) use of aspirin: Secondary | ICD-10-CM | POA: Diagnosis not present

## 2020-05-13 DIAGNOSIS — Z79899 Other long term (current) drug therapy: Secondary | ICD-10-CM | POA: Insufficient documentation

## 2020-05-13 DIAGNOSIS — R000 Tachycardia, unspecified: Secondary | ICD-10-CM | POA: Insufficient documentation

## 2020-05-13 DIAGNOSIS — I2510 Atherosclerotic heart disease of native coronary artery without angina pectoris: Secondary | ICD-10-CM | POA: Diagnosis not present

## 2020-05-13 DIAGNOSIS — N189 Chronic kidney disease, unspecified: Secondary | ICD-10-CM | POA: Diagnosis not present

## 2020-05-13 DIAGNOSIS — Z20822 Contact with and (suspected) exposure to covid-19: Secondary | ICD-10-CM | POA: Diagnosis not present

## 2020-05-13 DIAGNOSIS — K8050 Calculus of bile duct without cholangitis or cholecystitis without obstruction: Secondary | ICD-10-CM | POA: Diagnosis not present

## 2020-05-13 DIAGNOSIS — K8020 Calculus of gallbladder without cholecystitis without obstruction: Secondary | ICD-10-CM | POA: Diagnosis not present

## 2020-05-13 DIAGNOSIS — Z85828 Personal history of other malignant neoplasm of skin: Secondary | ICD-10-CM | POA: Diagnosis not present

## 2020-05-13 LAB — CBC
HCT: 41.7 % (ref 36.0–46.0)
Hemoglobin: 13.1 g/dL (ref 12.0–15.0)
MCH: 27.5 pg (ref 26.0–34.0)
MCHC: 31.4 g/dL (ref 30.0–36.0)
MCV: 87.6 fL (ref 80.0–100.0)
Platelets: 238 10*3/uL (ref 150–400)
RBC: 4.76 MIL/uL (ref 3.87–5.11)
RDW: 14.1 % (ref 11.5–15.5)
WBC: 6.2 10*3/uL (ref 4.0–10.5)
nRBC: 0 % (ref 0.0–0.2)

## 2020-05-13 LAB — COMPREHENSIVE METABOLIC PANEL
ALT: 45 U/L — ABNORMAL HIGH (ref 0–44)
AST: 97 U/L — ABNORMAL HIGH (ref 15–41)
Albumin: 4.2 g/dL (ref 3.5–5.0)
Alkaline Phosphatase: 112 U/L (ref 38–126)
Anion gap: 12 (ref 5–15)
BUN: 18 mg/dL (ref 8–23)
CO2: 25 mmol/L (ref 22–32)
Calcium: 9.8 mg/dL (ref 8.9–10.3)
Chloride: 104 mmol/L (ref 98–111)
Creatinine, Ser: 1.08 mg/dL — ABNORMAL HIGH (ref 0.44–1.00)
GFR, Estimated: 54 mL/min — ABNORMAL LOW (ref >60)
Glucose, Bld: 91 mg/dL (ref 70–99)
Potassium: 3.7 mmol/L (ref 3.5–5.1)
Sodium: 141 mmol/L (ref 135–145)
Total Bilirubin: 1.3 mg/dL — ABNORMAL HIGH (ref 0.3–1.2)
Total Protein: 7.6 g/dL (ref 6.5–8.1)

## 2020-05-13 LAB — LIPASE, BLOOD: Lipase: 43 U/L (ref 11–51)

## 2020-05-13 MED ORDER — ONDANSETRON HCL 4 MG/2ML IJ SOLN
4.0000 mg | Freq: Once | INTRAMUSCULAR | Status: AC
  Administered 2020-05-13: 4 mg via INTRAVENOUS
  Filled 2020-05-13: qty 2

## 2020-05-13 MED ORDER — MORPHINE SULFATE (PF) 4 MG/ML IV SOLN
4.0000 mg | Freq: Once | INTRAVENOUS | Status: AC
  Administered 2020-05-13: 4 mg via INTRAVENOUS
  Filled 2020-05-13: qty 1

## 2020-05-13 MED ORDER — SODIUM CHLORIDE 0.9 % IV BOLUS
1000.0000 mL | Freq: Once | INTRAVENOUS | Status: AC
  Administered 2020-05-13: 1000 mL via INTRAVENOUS

## 2020-05-13 NOTE — ED Provider Notes (Signed)
[facility] DEPT Provider Note   CSN: [id number] Arrival date & time: 05/13/20  1651     History Chief Complaint  Patient presents with  . Abdominal Pain    [name] is a 75 y.o. female with a history of renal mass s/p right nephrectomy in May 2021, CAD, HTN, thyroid cancer s/p thyroidectomy, hypothyroidism, CKD, PAT, OSA, bipolar 1 disorder, depression who presents to the emergency department by EMS from home with a chief complaint of abdominal pain.  The patient endorses right upper quadrant abdominal pain that began suddenly at 1500.  She characterizes the pain as sharp, 8 out of 10, constant, and worsening.  When the pain intensifies, it causes her to feel short of breath with deep breaths.  No history of similar pain.  The pain radiates through to her back.  Reports that she has been having some pain since the procedure several months ago.  However, the pain that she has had related to the nephrectomy feels very different from her episode of pain today.    She has had several episodes of nonbloody, nonbilious vomiting, nausea, and endorses feeling bloated with increased belching.  She has also been having chills for the last few weeks.  She did not eat lunch and had a bowl of cereal for breakfast.  No fever, dysuria, hematuria, rash, vaginal bleeding or discharge, diarrhea, constipation, palpitations, leg swelling, dizziness, lightheadedness, headache, or URI symptoms.  No treatment prior to arrival.  Surgical history includes right nephrectomy.  She has a known history of cholelithiasis, but has never had a cholecystectomy.  She is vaccinated against COVID-19.  Reports that she has been more depressed over the last few weeks.  Denies SI, HI, or auditory visual hallucinations.  No history of VTE.  She has been more immobilized over the last few weeks due to her depression and has been staying in bed more frequently.  No recent long trips.  She does  not take estrogen containing hormones.  The history is provided by the patient and medical records. No language interpreter was used.       Past Medical History:  Diagnosis Date  . Abscess of Bartholin's gland   . Acute vestibular neuronitis    Dr Lucia Gaskins  . ADHD (attention deficit hyperactivity disorder)   . Adverse effect of general anesthetic    felt paralyzed while receiving anesthesia  . Anxiety   . Bipolar 1 disorder ([place])   . CAD (coronary artery disease), native coronary artery    cath 05/2016 showing 50-70% stenosis in the mid LAD proximal to the first diagonal and 70-80% small OM1. FFR of LAD  not performed because of difficulty with catheter control from the right radial.   . Chronic kidney disease    kidney cancer- pt states she has elected to not have it treated.  . Depression    Bipolar disorder/goes to [facility] for meds  . Difficult intubation    told by [facility] that she was hard to intubate 15b yrs ago in [place]- surgery since then no problems  . Dyspnea   . Dysrhythmia    tachycardia  . GERD (gastroesophageal reflux disease)   . H/O echocardiogram 07/2012   [place] LVF w grade I siastolic dysfunction   . History of attention deficit disorder   . History of endometriosis   . History of pleural effusion   . Hyperlipidemia LDL goal <70 06/24/2016  . Hypertension   . Hypothyroidism 1990   after  partial thyroidectomy for thyroid adenoma  . Memory difficulty 01/26/2017  . Migraines   . Obesity   . OSA (obstructive sleep apnea)    uses oral appliance instead of CPAP  . PAT (paroxysmal atrial tachycardia) (H[facility])    s/p ablation  . PONV (postoperative nausea and vomiting)   . PTSD (post-traumatic stress disorder)   . PVC's (premature ventricular contractions)   . Renal mass 02/15/2012  . RLS (restless legs syndrome)    Dr Gwenette Greet  . rt renal ca dx'd 12/2009   no treatment/ no surg  . Vertigo     Patient Active Problem List   Diagnosis Date Noted  . Chronic  cough 02/22/2020  . Pure hypercholesterolemia 02/18/2019  . Moderate bipolar I disorder, most recent episode depressed ([place])   . Major depressive disorder, single episode, severe ([place]) 05/25/2018  . PONV (postoperative nausea and vomiting)   . Migraines   . History of pleural effusion   . History of attention deficit disorder   . History of endometriosis   . GERD (gastroesophageal reflux disease)   . Chronic depression not affecting current episode of care   . Chronic kidney disease   . CAD (coronary artery disease)   . Bipolar 1 disorder ([place])   . Anxiety   . Adverse effect of general anesthetic   . Acute vestibular neuronitis   . Abscess of Bartholin's gland   . ADHD (attention deficit hyperactivity disorder)   . Memory difficulty 01/26/2017  . Vertigo 01/26/2017  . Hyperlipidemia LDL goal <70 06/24/2016  . GAD (generalized anxiety disorder) 06/03/2015  . PTSD (post-traumatic stress disorder) 01/01/2014  . Bipolar I disorder, most recent episode depressed ([place]) 01/01/2014  . Major depressive disorder, recurrent severe without psychotic features ([place]) 09/25/2013  . PVC's (premature ventricular contractions) 05/31/2013  . Diastolic dysfunction, left ventricle 05/31/2013  . PAT (paroxysmal atrial tachycardia) ([place]) 05/31/2013  . Essential hypertension 05/31/2013  . Obesity 05/31/2013  . H/O echocardiogram 07/20/2012  . Generalized anxiety disorder 06/30/2012  . Renal mass 02/15/2012  . GALLSTONES 12/31/2009  . ATTENTION DEFICIT DISORDER 02/20/2007  . Obstructive sleep apnea 02/20/2007  . RESTLESS LEGS SYNDROME 02/20/2007  . Hypothyroidism 03/22/1988    Past Surgical History:  Procedure Laterality Date  . CARDIAC ELECTROPHYSIOLOGY [place] AND ABLATION  2000s  . LAPAROSCOPIC NEPHRECTOMY, HAND ASSISTED Right 07/31/2019   Procedure: HAND ASSISTED LAPAROSCOPIC NEPHRECTOMY CONVERTED TO OPEN WITH REPAIR OF INFERIOR VENA CAVAOTOMY;  Surgeon: Robley Fries, MD;  Location: [facility];  Service: Urology;  Laterality: Right;  3 HRS  . laparoscopy    . LEFT HEART CATH AND CORONARY ANGIOGRAPHY N/A 06/08/2016   Procedure: Left Heart Cath and Coronary Angiography;  Surgeon: Belva Crome, MD;  Location: [facility];  Service: Cardiovascular;  Laterality: N/A;  . nasoseptal reconstruction  1990s  . NEPHRECTOMY RECIPIENT Right 08/2019   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  . THYROIDECTOMY  1990  . TONSILLECTOMY     as a child  . TUBAL LIGATION  1970s  . uterine mass removal  03/2012   was found to be benign     OB History   No obstetric history on file.     Family History  Problem Relation Age of Onset  . Allergies Father   . Skin cancer Father   . Bipolar disorder Father   . Alcohol abuse Father   . Heart disease Mother   . Depression Mother   . Hypertension Mother   .  CAD Mother   . Colon cancer Paternal Grandmother   . OCD Paternal Grandmother   . Allergies Sister        multiple  . Allergies Brother        multiple  . Allergies Daughter   . Heart disease Maternal Grandfather   . Cancer Paternal Grandfather     Social History   Tobacco Use  . Smoking status: Never Smoker  . Smokeless tobacco: Never Used  Vaping Use  . Vaping Use: Never used  Substance Use Topics  . Alcohol use: Not Currently    Alcohol/week: 1.0 standard drink    Types: 1 Glasses of wine per week  . Drug use: No    Home Medications Prior to Admission medications   Medication Sig Start Date End Date Taking? Authorizing Provider  ondansetron (ZOFRAN ODT) 4 MG disintegrating tablet Take 1 tablet (4 mg total) by mouth every 8 (eight) hours as needed. 05/14/20  Yes Tailor Westfall A, PA-C  oxyCODONE (ROXICODONE) 5 MG immediate release tablet Take 1 tablet (5 mg total) by mouth every 6 (six) hours as needed for severe pain. 05/14/20  Yes Woodrow Dulski A, PA-C  aspirin EC 81 MG tablet Take 81 mg by mouth every evening.     [provider]  b complex vitamins tablet  Take 1 tablet by mouth daily.    [provider]  diphenhydrAMINE (BENADRYL) 25 mg capsule Take 25 mg by mouth every 6 (six) hours as needed for allergies.    [provider]  FLUoxetine (PROZAC) 40 MG capsule Take 40 mg by mouth daily. 02/04/20   [provider]  fluticasone (FLONASE) 50 MCG/ACT nasal spray Place 2 sprays into both nostrils as needed.  03/02/19   [provider]  Ginger, Zingiber officinalis, (GINGER PO) Take 1 Dose by mouth 4 (four) times daily as needed (nausea/vomiting). Candied Ginger    [provider]  levothyroxine (SYNTHROID) 75 MCG tablet Take 75 mcg by mouth daily before breakfast. 06/29/19   [provider]  LORazepam (ATIVAN) 0.5 MG tablet TAKE 1 TABLET (0.5 MG TOTAL) BY MOUTH 3 (THREE) TIMES DAILY AS NEEDED FOR ANXIETY. 12/10/19   [name], MD  losartan-hydrochlorothiazide [facility]) 50-12.5 MG tablet TAKE 1 TABLET BY MOUTH EVERY DAY 03/20/20   Donato Heinz, MD  Menthol, Topical Analgesic, (BLUE GEL EX) Apply 1 application topically every 6 (six) hours as needed (restless legs). BLUE ICE PAIN 3% Menthol    [provider]  methylphenidate (RITALIN) 5 MG tablet Take 1 tablet (5 mg total) by mouth 2 (two) times daily with breakfast and lunch. 09/27/19 02/22/20  [name], MD  Omega 3 1000 MG CAPS Take 1,000 mg by mouth daily.    [provider]  ondansetron (ZOFRAN) 8 MG tablet Take 8 mg by mouth every 8 (eight) hours as needed for nausea or vomiting.  01/03/19   [provider]  polyethylene glycol powder (MIRALAX) 17 GM/SCOOP powder Take 1 Container by mouth daily.    [provider]  rOPINIRole (REQUIP) 0.5 MG tablet TAKE 1 TABLET BY MOUTH DAILY AFTER SUPPER & TAKE 2 TABLETS BY MOUTH AT BEDTIME 04/16/20   Rigoberto Noel, MD  rosuvastatin (CRESTOR) 5 MG tablet Take 1 tablet (5 mg total) by mouth daily. 11/09/19 02/22/20  Buford Dresser, MD  traZODone (DESYREL)  100 MG tablet Take 100 mg by mouth at bedtime. 02/10/20   [provider]  Vitamin D, Cholecalciferol, 50 MCG (2000 UT)  CAPS Take 4,000 Units by mouth daily.     [provider]    Allergies    Geodon [ziprasidone hydrochloride], Lithium, Talwin [pentazocine], Toradol [ketorolac tromethamine], Ziprasidone, Abilify [aripiprazole], Compazine [prochlorperazine edisylate], Latuda [lurasidone hcl], Pristiq [desvenlafaxine succinate er], Rexulti [brexpiprazole], and Diflucan [fluconazole]  Review of Systems   Review of Systems  Constitutional: Positive for chills. Negative for activity change and fever.  HENT: Negative for congestion, sore throat and voice change.   Respiratory: Negative for cough, shortness of breath and wheezing.   Cardiovascular: Negative for chest pain.  Gastrointestinal: Positive for abdominal pain, nausea and vomiting. Negative for blood in stool, constipation, diarrhea and rectal pain.  Genitourinary: Negative for difficulty urinating, dysuria, flank pain, frequency, genital sores, hematuria, menstrual problem, urgency, vaginal bleeding, vaginal discharge and vaginal pain.  Musculoskeletal: Negative for arthralgias, back pain, gait problem, myalgias and neck stiffness.  Skin: Negative for rash.  Allergic/Immunologic: Negative for immunocompromised state.  Neurological: Negative for dizziness, seizures, syncope, weakness and headaches.  Psychiatric/Behavioral: Negative for confusion.    Physical Exam Updated Vital Signs BP 123/63   Pulse 87   Temp 98.7 F (37.1 C) (Oral)   Resp 18   SpO2 93%   Physical Exam Vitals and nursing note reviewed.  Constitutional:      General: She is not in acute distress.    Appearance: She is not ill-appearing, toxic-appearing or diaphoretic.  HENT:     Head: Normocephalic.  Eyes:     Conjunctiva/sclera: Conjunctivae normal.  Cardiovascular:     Rate and Rhythm: Regular rhythm. Tachycardia present.     Heart  sounds: No murmur heard. No friction rub. No gallop.   Pulmonary:     Effort: Pulmonary effort is normal. No respiratory distress.     Breath sounds: No stridor. No wheezing, rhonchi or rales.  Abdominal:     General: There is no distension.     Palpations: Abdomen is soft. There is no mass.     Tenderness: There is abdominal tenderness. There is no right CVA tenderness, left CVA tenderness, guarding or rebound.     Hernia: No hernia is present.     Comments: Tender to palpation in the right upper quadrant.  She has a positive Murphy sign.  No rebound or guarding.  She also has bilateral CVA tenderness.  No tenderness over McBurney's point.  Normoactive bowel sounds.  There are 2 large keloid appearing wounds noted to the abdominal wall that the patient states are consistent with her surgical incisions from her nephrectomy.  No erythema, dehiscence, or purulent drainage.  Musculoskeletal:     Cervical back: Neck supple.     Right lower leg: No edema.     Left lower leg: No edema.  Skin:    General: Skin is warm.     Findings: No rash.  Neurological:     Mental Status: She is alert.  Psychiatric:        Behavior: Behavior normal.     ED Results / Procedures / Treatments   Labs (all labs ordered are listed, but only abnormal results are displayed) Labs Reviewed  COMPREHENSIVE METABOLIC PANEL - Abnormal; Notable for the following components:      Result Value   Creatinine, Ser 1.08 (*)    AST 97 (*)    ALT 45 (*)    Total Bilirubin 1.3 (*)    GFR, Estimated 54 (*)    All other components within normal limits  RESP PANEL BY RT-PCR (FLU  A&B, COVID) ARPGX2  LIPASE, BLOOD  CBC  URINALYSIS, ROUTINE W REFLEX MICROSCOPIC    EKG None  Radiology US Abdomen Limited RUQ (LIVER/GB)  Result Date: 05/13/2020 CLINICAL DATA:  Right upper quadrant pain x1 day EXAM: ULTRASOUND ABDOMEN LIMITED RIGHT UPPER QUADRANT COMPARISON:  None. FINDINGS: Gallbladder: Echogenic gallstones are seen  within the dependent portion of the gallbladder lumen. The largest measures approximately 2.0 cm. There is no evidence of gallbladder wall thickening (2.8 mm). There is no evidence of pericholecystic fluid. No sonographic Murphy sign noted by sonographer. Common bile duct: Diameter: 3.7 mm Liver: No focal lesion identified. Within normal limits in parenchymal echogenicity. Portal vein is patent on color Doppler imaging with normal direction of blood flow towards the liver. Other: None. IMPRESSION: Cholelithiasis, without evidence of acute cholecystitis. Electronically Signed   By: Virgina Norfolk M.D.   On: 05/13/2020 23:21    Procedures Procedures   Medications Ordered in ED Medications  sodium chloride 0.9 % bolus 1,000 mL (0 mLs Intravenous Stopped 05/13/20 2348)  morphine 4 MG/ML injection 4 mg (4 mg Intravenous Given 05/13/20 2300)  ondansetron (ZOFRAN) injection 4 mg (4 mg Intravenous Given 05/13/20 2300)    ED Course  I have reviewed the triage vital signs and the nursing notes.  Pertinent labs & imaging results that were available during my care of the patient were reviewed by me and considered in my medical decision making (see chart for details).  Clinical Course as of 05/14/20 0120  Tue May 13, 2020  2237 AST(!): 97 [MM]  2237 ALT(!): 45 [MM]  2237 Total Bilirubin(!): 1.3 [MM]    Clinical Course User Index [MM] Perrin Smack   MDM Rules/Calculators/A&P                          75 year old female with a history of renal mass s/p right nephrectomy in May 2021, CAD, HTN, thyroid cancer s/p thyroidectomy, hypothyroidism, CKD, PAT, OSA, bipolar 1 disorder, depression presenting with right upper quadrant abdominal pain, nausea, and vomiting since approximately 1500 this afternoon.  She is also been having chills for the last 2 weeks.  Of note, the patient had a right nephrectomy in May 2021.  Reports that she has been having some pain since the procedure several months ago.   However, the pain that she has had related to the nephrectomy feels very different from her episode of pain today.  Mildly tachycardic.  Normotensive.  Afebrile.  No tachypnea or hypoxia.  Differential diagnosis includes cholecystitis, ascending cholangitis, choledocholithiasis, pancreatitis, pyelonephritis, PE, complications from prior right nephrectomy, gastroenteritis, tension pneumothorax, or pneumonia.  Labs and imaging have been reviewed and independently interpreted by me.  The patient was seen and independently evaluated by Dr. Kathrynn Humble, attending physician.  She has mild transaminitis.  Total bilirubin is also minimally elevated.  Coupled with her exam, I am concerned for cholecystitis.  Will order right ultrasound.  Will give morphine, Zofran, and fluids for history of vomiting, nausea, and pain.  Right upper quadrant ultrasound with cholelithiasis, but no evidence of cholecystitis.  On reevaluation, patient's pain has resolved.  She has been resting comfortably and has been successfully fluid challenge.   We will discharge patient home with pain medication and general surgery follow-up.  She will also receive antiemetics.  ER return precautions given.  She remains hemodynamically stable and in no acute distress.  Safe for discharge to home with outpatient follow-up as indicated.  Final Clinical Impression(s) / ED Diagnoses Final diagnoses:  RUQ abdominal pain  Biliary colic    Rx / DC Orders ED Discharge Orders         Ordered    oxyCODONE (ROXICODONE) 5 MG immediate release tablet  Every 6 hours PRN        05/14/20 0107    ondansetron (ZOFRAN ODT) 4 MG disintegrating tablet  Every 8 hours PRN        05/14/20 0107           Salinda Snedeker A, PA-C 05/14/20 0120    Varney Biles, MD 05/26/20 1210

## 2020-05-13 NOTE — ED Triage Notes (Signed)
Pt presents from home with c/o RUQ pain x 2 hours. Hx of nephrectomy in May of 21.

## 2020-05-13 NOTE — ED Notes (Addendum)
Patient said she is unable to give a urine sample at this time. She said she just went to the bathroom while she was waiting to be put in a room.

## 2020-05-13 NOTE — ED Notes (Signed)
Attempted to draw triage labs but unsuccessful.  Pt states that she needs an ultrasound IV generally.

## 2020-05-13 NOTE — ED Notes (Signed)
Pt given water and crackers for PO challenge

## 2020-05-14 LAB — RESP PANEL BY RT-PCR (FLU A&B, COVID) ARPGX2
Influenza A by PCR: NEGATIVE
Influenza B by PCR: NEGATIVE
SARS Coronavirus 2 by RT PCR: NEGATIVE

## 2020-05-14 MED ORDER — OXYCODONE HCL 5 MG PO TABS: 5.0000 mg | ORAL_TABLET | Freq: Four times a day (QID) | ORAL | 0 refills | Status: DC | PRN

## 2020-05-14 MED ORDER — ONDANSETRON 4 MG PO TBDP: 4.0000 mg | ORAL_TABLET | Freq: Three times a day (TID) | ORAL | 0 refills | Status: DC | PRN

## 2020-05-14 NOTE — Discharge Instructions (Addendum)
Thank you for allowing me to care for you today in the Emergency Department.   Your symptoms today seem to be coming from her gallbladder.  I have attached a gallbladder eating plan that may help you to prevent recurrent episodes of pain.  However, since you have gallstones, you should follow-up with general surgery as you may need to have your gallbladder removed in the future.  Let 1 tablet of Zofran dissolve in your tongue every 8 hours as needed for nausea or vomiting.  For pain control, you can take 650 mg of Tylenol once every 6 hours.  For severe, uncontrollable pain, you can take 1 tablet of oxycodone once every 6 hours.  This medication is a narcotic.  It can be addicting.  Do not take it if you have to work or drive.  You should not take it with other substances that may cause you to be drowsy, including your home medications or alcohol.  Return to the emergency department if you have uncontrollable vomiting despite taking Zofran, severe, uncontrollable pain despite following the pain regimen above, if you stop producing urine, if you pass out, or if you develop other new, concerning symptoms.

## 2020-05-14 NOTE — ED Notes (Signed)
Pt is still unable to provide urine sample at this time. 

## 2020-05-17 DIAGNOSIS — R110 Nausea: Secondary | ICD-10-CM | POA: Diagnosis not present

## 2020-05-17 DIAGNOSIS — R1011 Right upper quadrant pain: Secondary | ICD-10-CM | POA: Diagnosis not present

## 2020-05-20 DIAGNOSIS — R634 Abnormal weight loss: Secondary | ICD-10-CM | POA: Diagnosis not present

## 2020-05-20 DIAGNOSIS — K8020 Calculus of gallbladder without cholecystitis without obstruction: Secondary | ICD-10-CM | POA: Diagnosis not present

## 2020-05-20 DIAGNOSIS — G988 Other disorders of nervous system: Secondary | ICD-10-CM | POA: Diagnosis not present

## 2020-05-20 DIAGNOSIS — R110 Nausea: Secondary | ICD-10-CM | POA: Diagnosis not present

## 2020-05-20 DIAGNOSIS — K819 Cholecystitis, unspecified: Secondary | ICD-10-CM | POA: Diagnosis not present

## 2020-05-26 ENCOUNTER — Telehealth: Payer: Self-pay | Admitting: Pulmonary Disease

## 2020-05-26 NOTE — Telephone Encounter (Signed)
Attempted to call pt but unable to reach. Left message for her to return call. 

## 2020-05-26 NOTE — Telephone Encounter (Signed)
Called and spoke with patient. She stated that she feels like her "RLS is getting worse and traveling up to her thighs". She has not been able to sleep for the last 3 days due to the painful sensation in her legs. Sensation is bilateral. She is currently taking ropinirole 0.5mg  2 tablets before bed. Due to the increase in pain, she has been taking left over oxycodone 5mg  without any relief.   She has denied any recent falls or trauma to her legs.   She wants to know if RA has any other recommendations for her.   RA, can you please advise? Thanks!

## 2020-05-26 NOTE — Telephone Encounter (Signed)
No further recommendations. She can follow-up with her PCP for further evaluation

## 2020-05-27 ENCOUNTER — Encounter: Payer: Medicare Other | Admitting: Psychology

## 2020-05-27 NOTE — Telephone Encounter (Signed)
Called and left detailed message for pt of Dr. Bari Mantis recs. Will close encounter as this is the second attempt to reach pt.

## 2020-06-02 IMAGING — CR CHEST - 2 VIEW
2 series · 2 of 2 positions shown · non-contrast
Comparison: 10/14/2017

CLINICAL DATA: Nausea vomiting and diarrhea.

EXAM:
CHEST - 2 VIEW

[w chest lat]
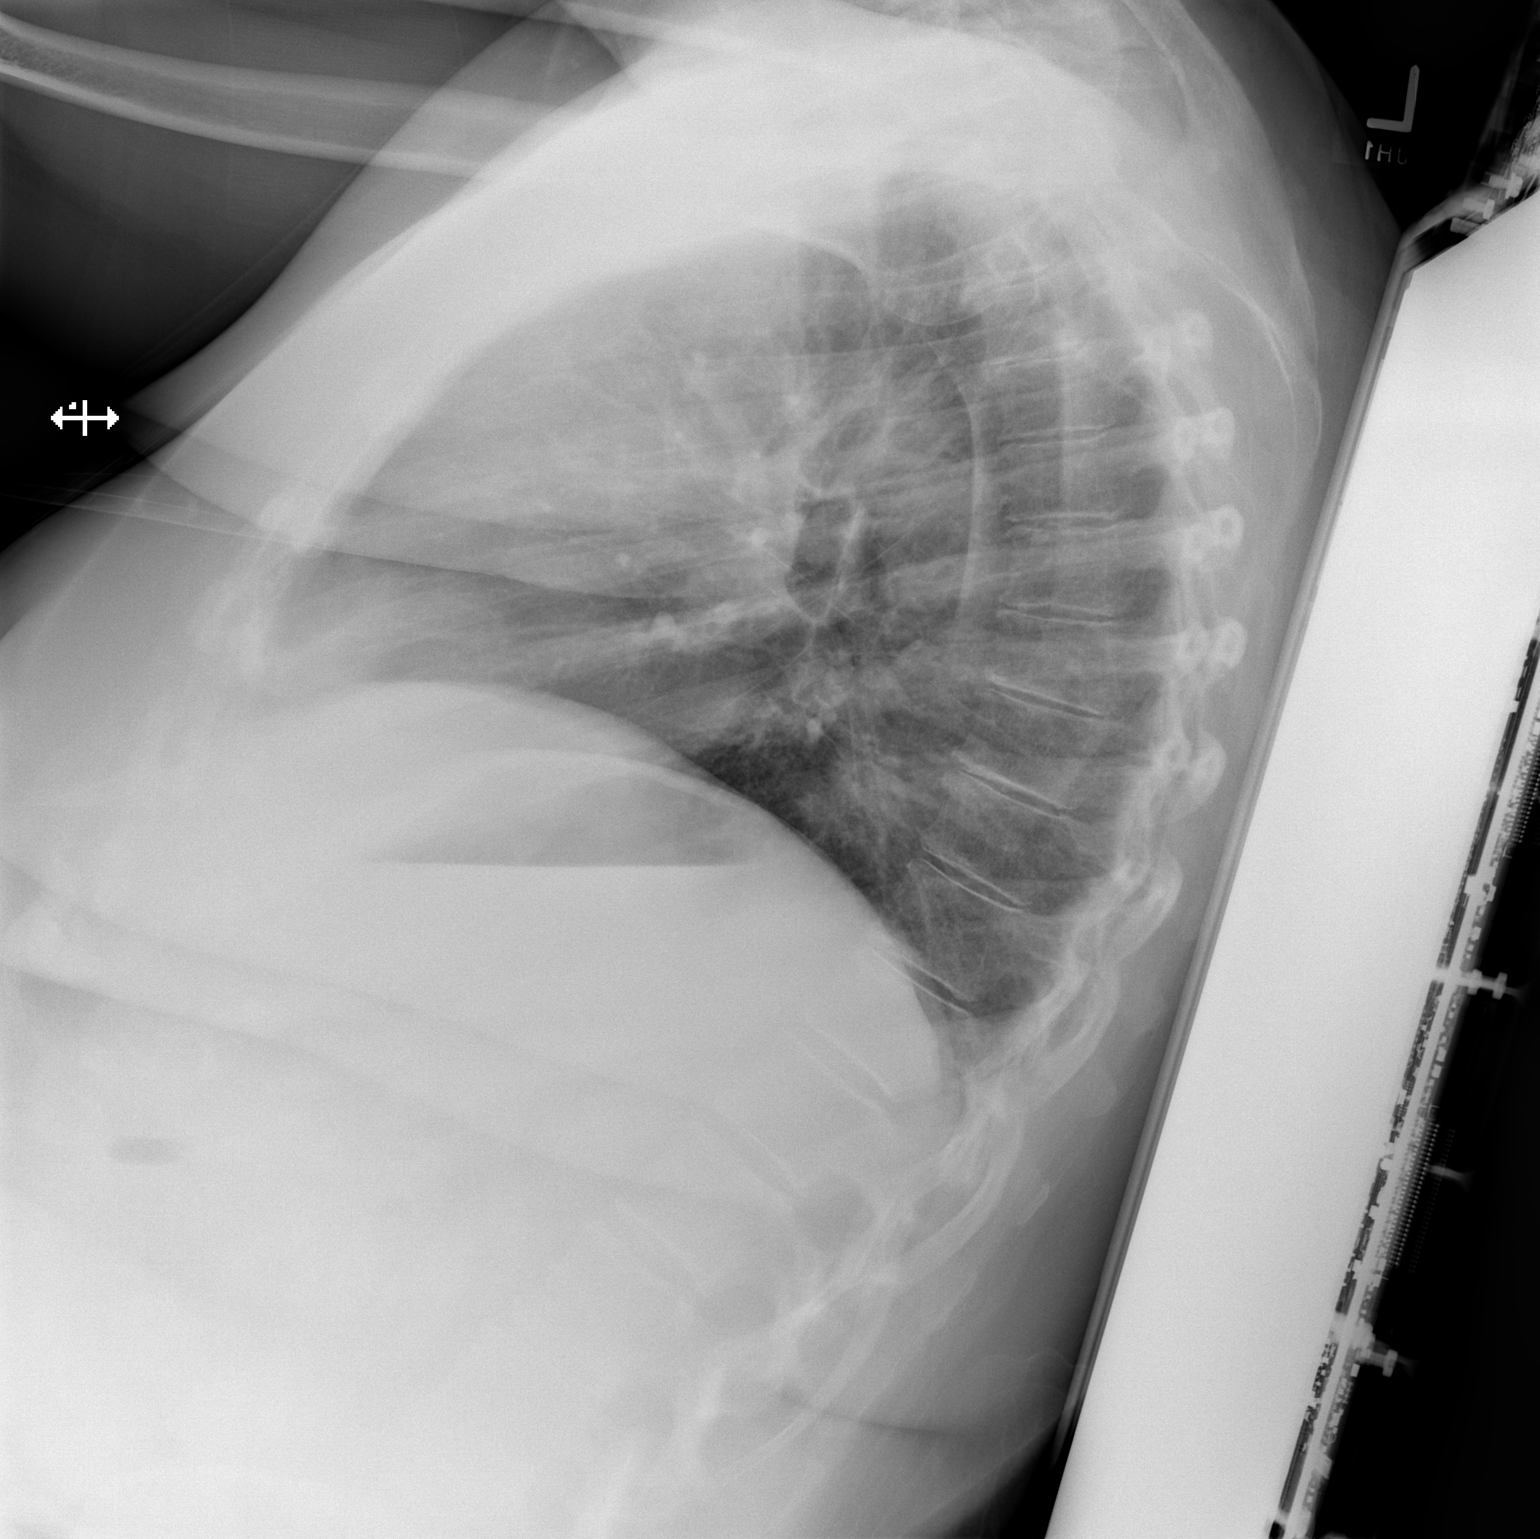

[x chest ap]
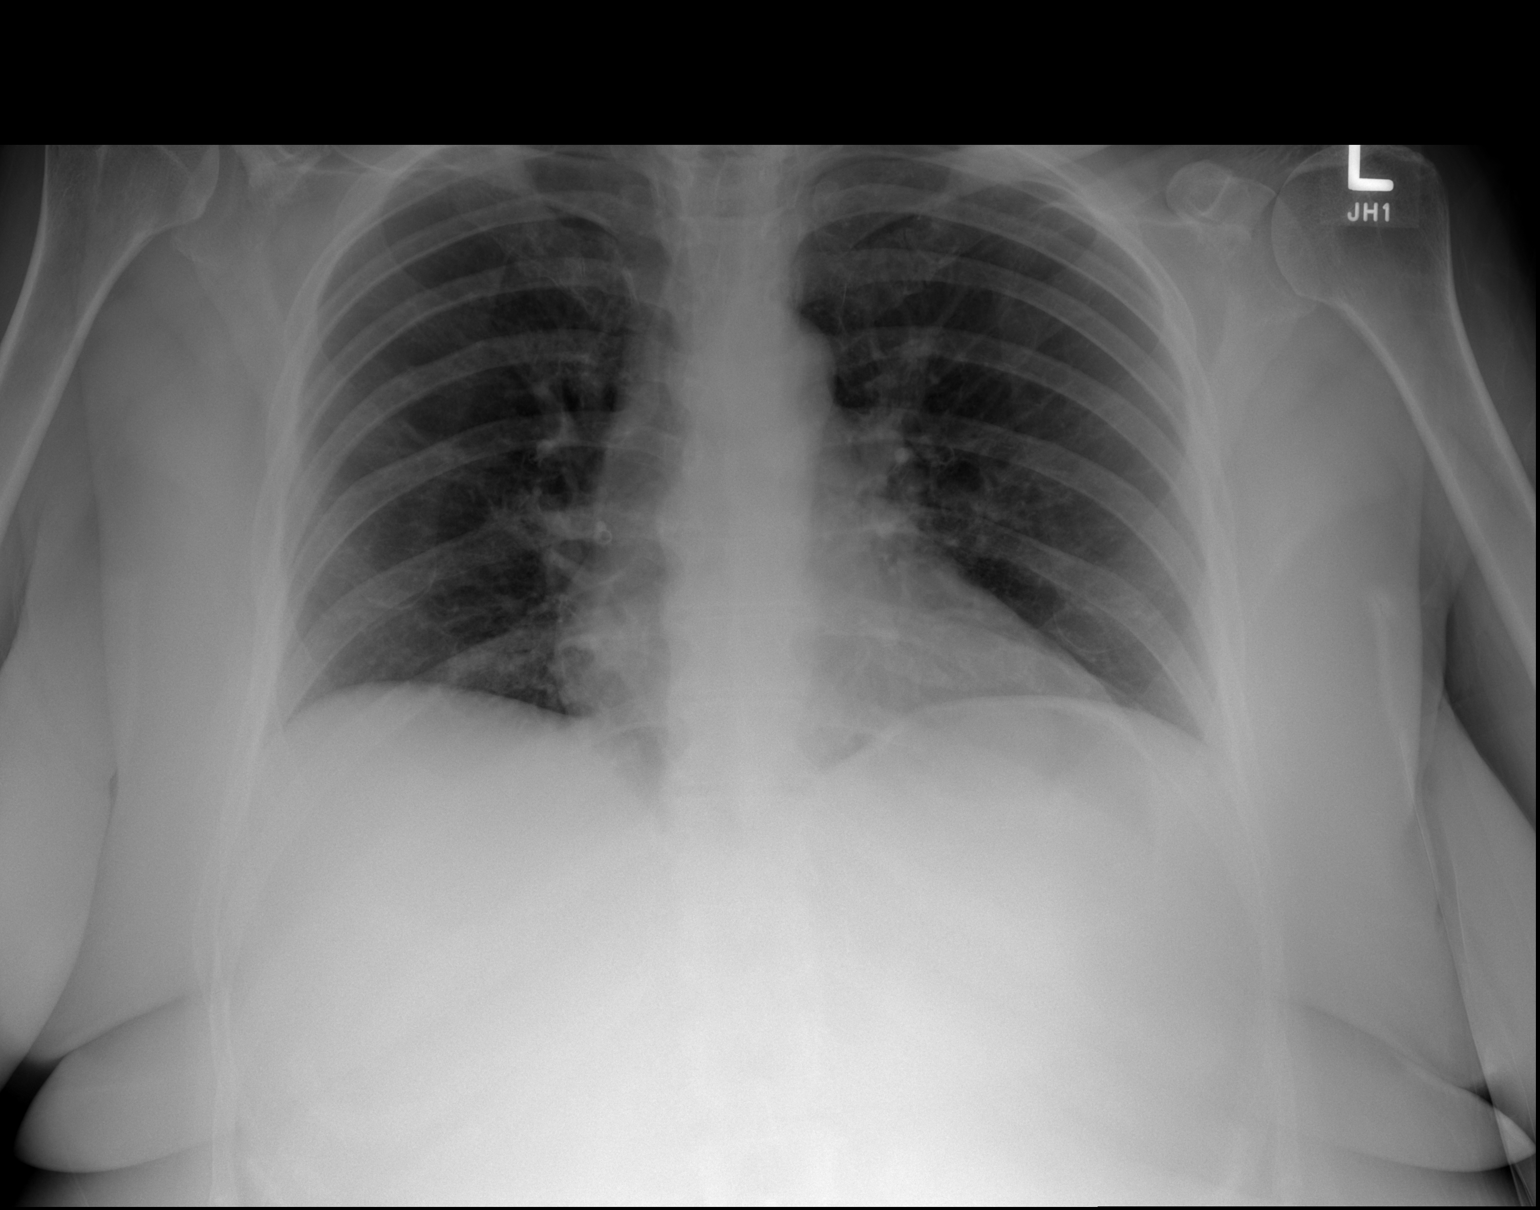

[2 of 2 positions shown; findings below may reference images not displayed]

FINDINGS: The heart size and mediastinal contours are within normal limits.
Both lungs are clear. The visualized skeletal structures are
unremarkable.
IMPRESSION: No active cardiopulmonary disease.

## 2020-06-03 DIAGNOSIS — M79605 Pain in left leg: Secondary | ICD-10-CM | POA: Diagnosis not present

## 2020-06-05 ENCOUNTER — Encounter: Payer: Self-pay | Admitting: Psychology

## 2020-06-09 DIAGNOSIS — N281 Cyst of kidney, acquired: Secondary | ICD-10-CM | POA: Diagnosis not present

## 2020-06-09 DIAGNOSIS — C641 Malignant neoplasm of right kidney, except renal pelvis: Secondary | ICD-10-CM | POA: Diagnosis not present

## 2020-06-12 ENCOUNTER — Telehealth: Payer: Self-pay | Admitting: *Deleted

## 2020-06-12 ENCOUNTER — Ambulatory Visit: Payer: Self-pay | Admitting: Surgery

## 2020-06-12 DIAGNOSIS — K8050 Calculus of bile duct without cholangitis or cholecystitis without obstruction: Secondary | ICD-10-CM | POA: Diagnosis not present

## 2020-06-12 NOTE — Telephone Encounter (Signed)
   [facility] Pre-operative Risk Assessment    HEARTCARE STAFF: - Please ensure there is not already an duplicate clearance open for this procedure. - Under Visit Info/Reason for Call, type in Other and utilize the format Clearance MM/DD/YY or Clearance TBD. Do not use dashes or single digits. - If request is for dental extraction, please clarify the # of teeth to be extracted.  Request for surgical clearance:  1. What type of surgery is being performed? LAP CHOLE   2. When is this surgery scheduled? TBD   3. What type of clearance is required (medical clearance vs. Pharmacy clearance to hold med vs. Both)? MEDICAL  4. Are there any medications that need to be held prior to surgery and how long? ASA    5. Practice name and name of physician performing surgery? [facility]; DR. Vikki Ports CONNOR  6. What is the office phone number? [phone]   7.   What is the office fax number? [place], CMA  8.   Anesthesia type (None, local, MAC, general) ? GENERAL   Julaine Hua 06/12/2020, 3:30 PM  _________________________________________________________________   (provider comments below)

## 2020-06-12 NOTE — H&P (Signed)
Surgical Evaluation  Chief Complaint: abdominal pain  HPI: Very nice 75yo woman with multiple medical problems referred by the ER for symptomatic cholelithiasis. We have actually met in 2017 for evaluation of the same, see below. She has had issues for years with upper abdominal pain and nausea, but has just dealt with it. She also has pain along the lateral and superior aspect of her right subcostal scar which feels like needles/burning, and this has been ongoing since surgery in June. She had a severe episode last month which was unrelenting and brought her to the ER, where [place] confirmed cholelithiasis up to 2cm without evidence of cholecystitis. Her white count was normal at that time, bilirubin 1.3, AST/ALT 97/45, creatinine 1.08. She has been doing well since then, sticking with a low-fat/bland diet. She does endorse history of palpitations and fluttering in her chest, her cardiologist is Dr. Radford Pax. She did have a cath a couple years ago confirming coronary artery disease. She has a history of renal cancer, and in June of last year underwent a hand-assisted right nephrectomy which was complicated by bleeding from the IVC requiring conversion to open through a right subcostal incision.  She notes that she does have an advanced directive and is strictly DNR, but wishes to pursue treatment for her symptomatic cholelithiasis.  01/16/16: Ms. Booton returns to discuss the results of the tests we ordered last time. Since I saw her last month she says her symptoms have actually improved. She has been eating better and had lost 8lb, but old eating habits returned and she is back up 4lb. She has been seen in cardiology clinic this week where she noted some worsening shortness of breath with exertion and associated pressure on her chest, and she has an echo and coronary CTA pending. She also notes several stressors on her mind- her RCC, upcoming marriage and problems in their relationship/issues with  personal space. She is unsure today about pursuing surgery.  To recall, this is a very nice 75 year old woman with multiple comorbidities who presents with a one-month history of abdominal discomfort, which most recently is in bilateral subcostal margins, associated with nausea and nonbilious nonbloody emesis, decreased appetite, intermittent diarrhea and constipation. She states that she had an x-ray about 5 years ago which showed some problems with her gallbladder. She denies fevers chills or unintentional weight loss but reports fatigue which is a chronic issue for her. She does not have any records in our system or in Epic of recent workup. Of note she has a history of renal cell carcinoma which she states was diagnosed in 2011 and for which she declined treatment, although she was offered surgery. She has never had a screening colonoscopy, and does have a family history of colon cancer. She also has a history of bipolar disorder, posttraumatic stress disorder, obstructive sleep apnea, depression, endometriosis status post diagnostic laparoscopy 3 (now resolved post-menopause), hypothyroidism, hypertension, and SVT status post ablation.  She has had an Korea of her gb which confirms several stones up to 1.5cm in size, mildly distended GB, fatty liver and CBD 57m. She underwent CT abdomen/pelvis showing the same, with the addition of slight increase size of right RCC to 6.1x5.2x4.8cm (was 5.7x5.5x4.5 in 2014). a couple liver and spleen cysts, uterine fibroids.  She had a CBC, LFT, BMP and these were unremarkable.  Recent EKG- NSR.  ABDOMINAL PAIN (Principal Diagnosis) (R10.9) Story: Given her improvement with change in eating habits and results of this workup, I recommended that it is  reasonable to proceed with cholecystectomy as it is more likely that biliary colic is in fact contributing to her upper abdominal pain, nausea and emesis. We discussed again the nature of laparoscopic  cholecystectomy, risks, benefits, alternatives and we gave her a lap chole pamphlet. She brought up her own fears of somatization, and I reassured her that the cholelithiasis and maybe her R RCC are good reasons to have pain and such symptoms. We also discussed the possibility of ongoing symptoms despite cholecystectomy, in which case a different pathway for workup will need to be pursued. She states she is 80% sure she will proceed with surgery, she will call us to let us know if/when she is ready to schedule. She will need to go through with the echo/coronary CTA ordered by cards prior and will need cards clearance. 30 minutes were spent on face to face counseling and discussion.    Allergies  Allergen Reactions  . Geodon [Ziprasidone Hydrochloride] Other (See Comments)    Extremely agitated  . Lithium Nausea Only and Other (See Comments)    Off balance, increased heart rate  . Talwin [Pentazocine] Other (See Comments)    Hallucinations   . Toradol [Ketorolac Tromethamine] Other (See Comments)    Chest pains  . Ziprasidone Other (See Comments)    Extremely agitated  . Abilify [Aripiprazole] Other (See Comments)    jerking  . Compazine [Prochlorperazine Edisylate] Nausea And Vomiting  . Latuda [Lurasidone Hcl] Other (See Comments)    Reports made her mind race more and irritable   . Pristiq [Desvenlafaxine Succinate Er] Other (See Comments)    Did not work, prefers not to take  . Rexulti [Brexpiprazole] Swelling    Elevated BP, created aggression  . Diflucan [Fluconazole] Nausea And Vomiting    Past Medical History:  Diagnosis Date  . Abscess of Bartholin's gland   . Acute vestibular neuronitis    Dr Lucia Gaskins  . ADHD (attention deficit hyperactivity disorder)   . Adverse effect of general anesthetic    felt paralyzed while receiving anesthesia  . Anxiety   . Bipolar 1 disorder ([place])   . CAD (coronary artery disease), native coronary artery    cath 05/2016 showing 50-70% stenosis  in the mid LAD proximal to the first diagonal and 70-80% small OM1. FFR of LAD  not performed because of difficulty with catheter control from the right radial.   . Chronic kidney disease    kidney cancer- pt states she has elected to not have it treated.  . Depression    Bipolar disorder/goes to [facility] for meds  . Difficult intubation    told by [facility] that she was hard to intubate 15b yrs ago in [place]- surgery since then no problems  . Dyspnea   . Dysrhythmia    tachycardia  . GERD (gastroesophageal reflux disease)   . H/O echocardiogram 07/2012   [place] LVF w grade I siastolic dysfunction   . History of attention deficit disorder   . History of endometriosis   . History of pleural effusion   . Hyperlipidemia LDL goal <70 06/24/2016  . Hypertension   . 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 Memory difficulty 01/26/2017  . Migraines   . Obesity   . OSA (obstructive sleep apnea)    uses oral appliance instead of CPAP  . PAT (paroxysmal atrial tachycardia) ([facility])    s/p ablation  . PONV (postoperative nausea and vomiting)   . PTSD (post-traumatic stress disorder)   .  PVC's (premature ventricular contractions)   . Renal mass 02/15/2012  . RLS (restless legs syndrome)    Dr Gwenette Greet  . rt renal ca dx'd 12/2009   no treatment/ no surg  . Vertigo     Past Surgical History:  Procedure Laterality Date  . CARDIAC ELECTROPHYSIOLOGY [place] AND ABLATION  2000s  . LAPAROSCOPIC NEPHRECTOMY, HAND ASSISTED Right 07/31/2019   Procedure: HAND ASSISTED LAPAROSCOPIC NEPHRECTOMY CONVERTED TO OPEN WITH REPAIR OF INFERIOR VENA CAVAOTOMY;  Surgeon: Robley Fries, MD;  Location: [facility];  Service: Urology;  Laterality: Right;  3 HRS  . laparoscopy    . LEFT HEART CATH AND CORONARY ANGIOGRAPHY N/A 06/08/2016   Procedure: Left Heart Cath and Coronary Angiography;  Surgeon: Belva Crome, MD;  Location: [facility];  Service: Cardiovascular;  Laterality: N/A;   . nasoseptal reconstruction  1990s  . NEPHRECTOMY RECIPIENT Right 08/2019   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  . THYROIDECTOMY  1990  . TONSILLECTOMY     as a child  . TUBAL LIGATION  1970s  . uterine mass removal  03/2012   was found to be benign    Family History  Problem Relation Age of Onset  . Allergies Father   . Skin cancer Father   . Bipolar disorder Father   . Alcohol abuse Father   . Heart disease Mother   . Depression Mother   . Hypertension Mother   . CAD Mother   . Colon cancer Paternal Grandmother   . OCD Paternal Grandmother   . Allergies Sister        multiple  . Allergies Brother        multiple  . Allergies Daughter   . Heart disease Maternal Grandfather   . Cancer Paternal Grandfather     Social History   Socioeconomic History  . Marital status: Single    Spouse name: Not on file  . Number of children: 1  . Years of education: 31  . Highest education level: Not on file  Occupational History  . 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 Smoking status: Never Smoker  . Smokeless tobacco: Never Used  Vaping Use  . Vaping Use: Never used  Substance and Sexual Activity  . Alcohol use: Not Currently    Alcohol/week: 1.0 standard drink    Types: 1 Glasses of wine per week  . Drug use: No  . Sexual activity: Yes    Partners: Male    Birth control/protection: None  Other Topics Concern  . Not on file  Social History Narrative   Divorced and lives alone   Has children   Caffeine use: none   Right handed    Social Determinants of Health   Financial Resource Strain: Not on file  Food Insecurity: Not on file  Transportation Needs: Not on file  Physical Activity: Not on file  Stress: Not on file  Social Connections: Not on file    Current Outpatient Medications on File Prior to Visit  Medication Sig Dispense Refill  . aspirin EC 81 MG tablet Take 81 mg by mouth every evening.     Marland Kitchen b complex vitamins tablet  Take 1 tablet by mouth daily.    . diphenhydrAMINE (BENADRYL) 25 mg capsule Take 25 mg by mouth every 6 (six) hours as needed for allergies.    Marland Kitchen FLUoxetine (PROZAC) 40 MG capsule Take 40 mg by mouth daily.    . fluticasone (FLONASE) 50  MCG/ACT nasal spray Place 2 sprays into both nostrils as needed.     . Ginger, Zingiber officinalis, (GINGER PO) Take 1 Dose by mouth 4 (four) times daily as needed (nausea/vomiting). Candied Ginger    . levothyroxine (SYNTHROID) 75 MCG tablet Take 75 mcg by mouth daily before breakfast.    . LORazepam (ATIVAN) 0.5 MG tablet TAKE 1 TABLET (0.5 MG TOTAL) BY MOUTH 3 (THREE) TIMES DAILY AS NEEDED FOR ANXIETY. 90 tablet 0  . losartan-hydrochlorothiazide (HYZAAR) 50-12.5 MG tablet TAKE 1 TABLET BY MOUTH EVERY DAY 90 tablet 2  . Menthol, Topical Analgesic, (BLUE GEL EX) Apply 1 application topically every 6 (six) hours as needed (restless legs). BLUE ICE PAIN 3% Menthol    . methylphenidate (RITALIN) 5 MG tablet Take 1 tablet (5 mg total) by mouth 2 (two) times daily with breakfast and lunch. 60 tablet 0  . Omega 3 1000 MG CAPS Take 1,000 mg by mouth daily.    . ondansetron (ZOFRAN ODT) 4 MG disintegrating tablet Take 1 tablet (4 mg total) by mouth every 8 (eight) hours as needed. 20 tablet 0  . ondansetron (ZOFRAN) 8 MG tablet Take 8 mg by mouth every 8 (eight) hours as needed for nausea or vomiting.     Marland Kitchen oxyCODONE (ROXICODONE) 5 MG immediate release tablet Take 1 tablet (5 mg total) by mouth every 6 (six) hours as needed for severe pain. 8 tablet 0  . polyethylene glycol powder (MIRALAX) 17 GM/SCOOP powder Take 1 Container by mouth daily.    Marland Kitchen rOPINIRole (REQUIP) 0.5 MG tablet TAKE 1 TABLET BY MOUTH DAILY AFTER SUPPER & TAKE 2 TABLETS BY MOUTH AT BEDTIME 90 tablet 1  . rosuvastatin (CRESTOR) 5 MG tablet Take 1 tablet (5 mg total) by mouth daily. 90 tablet 3  . traZODone (DESYREL) 100 MG tablet Take 100 mg by mouth at bedtime.    . Vitamin D, Cholecalciferol, 50 MCG  (2000 UT) CAPS Take 4,000 Units by mouth daily.      No current facility-administered medications on file prior to visit.    Vitals  Weight: 217 lb Height: 65in Body Surface Area: 2.05 m Body Mass Index: 36.11 kg/m  Temp.: 97.36F  Pulse: 87 (Regular)  P.OX: 98% (Room air) BP: 160/80(Sitting, Left Arm, Standard)  Alert and well-appearing Unlabored respirations Abdomen soft, nontender, nondistended. Well-healed right subcostal and periumbilical incisions.   CBC Latest Ref Rng & Units 05/13/2020 01/20/2020 08/05/2019  WBC 4.0 - 10.5 K/uL 6.2 7.8 -  Hemoglobin 12.0 - 15.0 g/dL 13.1 12.2 7.9(L)  Hematocrit 36.0 - 46.0 % 41.7 38.0 25.3(L)  Platelets 150 - 400 K/uL 238 214 -    CMP Latest Ref Rng & Units 05/13/2020 01/20/2020 08/03/2019  Glucose 70 - 99 mg/dL 91 95 134(H)  BUN 8 - 23 mg/dL 18 36(H) 8  Creatinine 0.44 - 1.00 mg/dL 1.08(H) 1.19(H) 1.03(H)  Sodium 135 - 145 mmol/L 141 137 132(L)  Potassium 3.5 - 5.1 mmol/L 3.7 3.9 3.6  Chloride 98 - 111 mmol/L 104 103 100  CO2 22 - 32 mmol/L _0 Calcium 8.9 - 10.3 mg/dL 9.8 9.8 8.1(L)  Total Protein 6.5 - 8.1 g/dL 7.6 - -  Total Bilirubin 0.3 - 1.2 mg/dL 1.3(H) - -  Alkaline Phos 38 - 126 U/L 112 - -  AST 15 - 41 U/L 97(H) - -  ALT 0 - 44 U/L 45(H) - -    Lab Results  Component Value Date   INR 0.96 06/08/2016  Imaging: No results found.   A/P: BILIARY COLIC (B76.28) Story: This has been an ongoing issue for her. I recommend proceeding with laparoscopic cholecystectomy with possible cholangiogram. Discussed the relevant anatomy using a diagram to demonstrate, technique and risks of surgery including bleeding, pain, scarring, intraabdominal injury specifically to the common bile duct and sequelae, conversion to open surgery, failure to resolve symptoms, blood clots/ pulmonary embolus, heart attack, pneumonia, stroke, death. Questions welcomed and answered to patient's satisfaction. She is agreeable to proceed  with surgery.    Patient Active Problem List   Diagnosis Date Noted  . Chronic cough 02/22/2020  . Pure hypercholesterolemia 02/18/2019  . Moderate bipolar I disorder, most recent episode depressed ([place])   . Major depressive disorder, single episode, severe ([place]) 05/25/2018  . PONV (postoperative nausea and vomiting)   . Migraines   . History of pleural effusion   . History of attention deficit disorder   . History of endometriosis   . GERD (gastroesophageal reflux disease)   . Chronic depression not affecting current episode of care   . Chronic kidney disease   . CAD (coronary artery disease)   . Bipolar 1 disorder ([place])   . Anxiety   . Adverse effect of general anesthetic   . Acute vestibular neuronitis   . Abscess of Bartholin's gland   . ADHD (attention deficit hyperactivity disorder)   . Memory difficulty 01/26/2017  . Vertigo 01/26/2017  . Hyperlipidemia LDL goal <70 06/24/2016  . GAD (generalized anxiety disorder) 06/03/2015  . PTSD (post-traumatic stress disorder) 01/01/2014  . Bipolar I disorder, most recent episode depressed ([place]) 01/01/2014  . Major depressive disorder, recurrent severe without psychotic features ([place]) 09/25/2013  . PVC's (premature ventricular contractions) 05/31/2013  . Diastolic dysfunction, left ventricle 05/31/2013  . PAT (paroxysmal atrial tachycardia) ([place]) 05/31/2013  . Essential hypertension 05/31/2013  . Obesity 05/31/2013  . H/O echocardiogram 07/20/2012  . Generalized anxiety disorder 06/30/2012  . Renal mass 02/15/2012  . GALLSTONES 12/31/2009  . ATTENTION DEFICIT DISORDER 02/20/2007  . Obstructive sleep apnea 02/20/2007  . RESTLESS LEGS SYNDROME 02/20/2007  . Hypothyroidism 03/22/1988       Romana Juniper, MD [facility], [place]  See AMION to contact appropriate on-call provider

## 2020-06-13 NOTE — Telephone Encounter (Signed)
1st attempt to reach pt regarding the surgical clearance below and the need for an appointment. Left pt a message to call back to schedule with Dr. Harrell Gave or APP at [place] or 1st available.

## 2020-06-13 NOTE — Telephone Encounter (Signed)
   Primary Cardiologist: Buford Dresser, MD  Chart reviewed as part of pre-operative protocol coverage. Because of [name] past medical history and time since last visit, she will require a follow-up visit in order to better assess preoperative cardiovascular risk.  Pre-op covering staff: - Please schedule appointment and call patient to inform them. If patient already had an upcoming appointment within acceptable timeframe, please add "pre-op clearance" to the appointment notes so provider is aware. - Please contact requesting surgeon's office via preferred method (i.e, phone, fax) to inform them of need for appointment prior to surgery.  If applicable, this message will also be routed to pharmacy pool and/or primary cardiologist for input on holding anticoagulant/antiplatelet agent as requested below so that this information is available to the clearing provider at time of patient's appointment.   [place], [place]  06/13/2020, 1:30 PM

## 2020-06-16 NOTE — Telephone Encounter (Signed)
Left message for pt to call back and schedule a pre op appt with Dr. Al Pimple or APP.

## 2020-06-17 NOTE — Telephone Encounter (Signed)
3rd attempt unsuccessful to reach pt to inform her of the need for an appointment for clearance. Will route to requesting surgeons office to make them aware and remove from preop pool.

## 2020-06-18 DIAGNOSIS — L603 Nail dystrophy: Secondary | ICD-10-CM | POA: Diagnosis not present

## 2020-06-18 DIAGNOSIS — L600 Ingrowing nail: Secondary | ICD-10-CM | POA: Diagnosis not present

## 2020-06-18 DIAGNOSIS — L03032 Cellulitis of left toe: Secondary | ICD-10-CM | POA: Diagnosis not present

## 2020-06-18 DIAGNOSIS — L03031 Cellulitis of right toe: Secondary | ICD-10-CM | POA: Diagnosis not present

## 2020-06-18 DIAGNOSIS — I739 Peripheral vascular disease, unspecified: Secondary | ICD-10-CM | POA: Diagnosis not present

## 2020-06-18 DIAGNOSIS — B351 Tinea unguium: Secondary | ICD-10-CM | POA: Diagnosis not present

## 2020-06-24 ENCOUNTER — Inpatient Hospital Stay: Admission: RE | Admit: 2020-06-24 | Payer: Medicare Other | Source: Ambulatory Visit

## 2020-06-25 DIAGNOSIS — R7303 Prediabetes: Secondary | ICD-10-CM | POA: Diagnosis not present

## 2020-06-25 DIAGNOSIS — R7309 Other abnormal glucose: Secondary | ICD-10-CM | POA: Diagnosis not present

## 2020-06-25 DIAGNOSIS — E039 Hypothyroidism, unspecified: Secondary | ICD-10-CM | POA: Diagnosis not present

## 2020-06-25 DIAGNOSIS — D72819 Decreased white blood cell count, unspecified: Secondary | ICD-10-CM | POA: Diagnosis not present

## 2020-06-25 DIAGNOSIS — E878 Other disorders of electrolyte and fluid balance, not elsewhere classified: Secondary | ICD-10-CM | POA: Diagnosis not present

## 2020-06-25 DIAGNOSIS — R059 Cough, unspecified: Secondary | ICD-10-CM | POA: Diagnosis not present

## 2020-06-25 DIAGNOSIS — K148 Other diseases of tongue: Secondary | ICD-10-CM | POA: Diagnosis not present

## 2020-07-02 ENCOUNTER — Inpatient Hospital Stay: Payer: Medicare Other | Attending: Oncology | Admitting: Oncology

## 2020-07-02 ENCOUNTER — Other Ambulatory Visit: Payer: Self-pay

## 2020-07-02 VITALS — BP 119/69 | HR 68 | Temp 97.6°F | Resp 16 | Ht 63.0 in | Wt 217.4 lb

## 2020-07-02 DIAGNOSIS — Z79899 Other long term (current) drug therapy: Secondary | ICD-10-CM | POA: Diagnosis not present

## 2020-07-02 DIAGNOSIS — N2889 Other specified disorders of kidney and ureter: Secondary | ICD-10-CM | POA: Diagnosis not present

## 2020-07-02 DIAGNOSIS — Z85528 Personal history of other malignant neoplasm of kidney: Secondary | ICD-10-CM | POA: Diagnosis not present

## 2020-07-02 DIAGNOSIS — C641 Malignant neoplasm of right kidney, except renal pelvis: Secondary | ICD-10-CM

## 2020-07-02 NOTE — Progress Notes (Signed)
Hematology and Oncology Follow Up Visit  [name] [id number] 1945/04/17 75 y.o. 07/02/2020 10:22 AM  CC: [name] ([name]) Harrington Challenger, M.D.    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  She presented with a kidney mass and a slow growth since 2011.  Prior therapy: She is status post right radical nephrectomy completed by Dr. Claudia Desanctis on Jul 31, 2019.  Final pathology showed a 6 cm clear-cell renal cell carcinoma.  Current therapy: Active surveillance.  Interim History:  [name] is here for a follow-up visit.  Since the last visit, she reports a few complaints although not anything new at this time.  She has reported issues with her mood although manageable overall.  She still has pain around the incision site but has not had any hematuria or dysuria.  Weight loss or appetite changes.  Performance status quality of life remained reasonable.     Medications: Updated on review. Current Outpatient Medications  Medication Sig Dispense Refill  . aspirin EC 81 MG tablet Take 81 mg by mouth every evening.     Marland Kitchen b complex vitamins tablet Take 1 tablet by mouth daily.    . diphenhydrAMINE (BENADRYL) 25 mg capsule Take 25 mg by mouth every 6 (six) hours as needed for allergies.    Marland Kitchen FLUoxetine (PROZAC) 40 MG capsule Take 40 mg by mouth daily.    . fluticasone (FLONASE) 50 MCG/ACT nasal spray Place 2 sprays into both nostrils as needed.     . Ginger, Zingiber officinalis, (GINGER PO) Take 1 Dose by mouth 4 (four) times daily as needed (nausea/vomiting). Candied Ginger    . levothyroxine (SYNTHROID) 75 MCG tablet Take 75 mcg by mouth daily before breakfast.    . LORazepam (ATIVAN) 0.5 MG tablet TAKE 1 TABLET (0.5 MG TOTAL) BY MOUTH 3 (THREE) TIMES DAILY AS NEEDED FOR ANXIETY. 90 tablet 0  . losartan-hydrochlorothiazide (HYZAAR) 50-12.5 MG tablet TAKE 1 TABLET BY MOUTH EVERY DAY 90 tablet 2  . Menthol, Topical Analgesic, (BLUE GEL EX)  Apply 1 application topically every 6 (six) hours as needed (restless legs). BLUE ICE PAIN 3% Menthol    . methylphenidate (RITALIN) 5 MG tablet Take 1 tablet (5 mg total) by mouth 2 (two) times daily with breakfast and lunch. 60 tablet 0  . Omega 3 1000 MG CAPS Take 1,000 mg by mouth daily.    . ondansetron (ZOFRAN ODT) 4 MG disintegrating tablet Take 1 tablet (4 mg total) by mouth every 8 (eight) hours as needed. 20 tablet 0  . ondansetron (ZOFRAN) 8 MG tablet Take 8 mg by mouth every 8 (eight) hours as needed for nausea or vomiting.     Marland Kitchen oxyCODONE (ROXICODONE) 5 MG immediate release tablet Take 1 tablet (5 mg total) by mouth every 6 (six) hours as needed for severe pain. 8 tablet 0  . polyethylene glycol powder (MIRALAX) 17 GM/SCOOP powder Take 1 Container by mouth daily.    Marland Kitchen rOPINIRole (REQUIP) 0.5 MG tablet TAKE 1 TABLET BY MOUTH DAILY AFTER SUPPER & TAKE 2 TABLETS BY MOUTH AT BEDTIME 90 tablet 1  . rosuvastatin (CRESTOR) 5 MG tablet Take 1 tablet (5 mg total) by mouth daily. 90 tablet 3  . traZODone (DESYREL) 100 MG tablet Take 100 mg by mouth at bedtime.    . Vitamin D, Cholecalciferol, 50 MCG (2000 UT) CAPS Take 4,000 Units by mouth daily.      No current facility-administered medications for this visit.  Allergies:  Allergies  Allergen Reactions  . Geodon [Ziprasidone Hydrochloride] Other (See Comments)    Extremely agitated  . Lithium Nausea Only and Other (See Comments)    Off balance, increased heart rate  . Talwin [Pentazocine] Other (See Comments)    Hallucinations   . Toradol [Ketorolac Tromethamine] Other (See Comments)    Chest pains  . Ziprasidone Other (See Comments)    Extremely agitated  . Abilify [Aripiprazole] Other (See Comments)    jerking  . Compazine [Prochlorperazine Edisylate] Nausea And Vomiting  . Latuda [Lurasidone Hcl] Other (See Comments)    Reports made her mind race more and irritable   . Pristiq [Desvenlafaxine Succinate Er] Other (See  Comments)    Did not work, prefers not to take  . Rexulti [Brexpiprazole] Swelling    Elevated BP, created aggression  . Diflucan [Fluconazole] Nausea And Vomiting     Physical Exam:   Blood pressure 119/69, pulse 68, temperature 97.6 F (36.4 C), temperature source Tympanic, resp. rate 16, height 5\' 3"  (1.6 m), weight 217 lb 6.4 oz (98.6 kg), SpO2 99 %.    ECOG: 1   General appearance: Alert, awake without any distress. Head: Atraumatic without abnormalities Oropharynx: Without any thrush or ulcers. Eyes: No scleral icterus. Lymph nodes: No lymphadenopathy noted in the cervical, supraclavicular, or axillary nodes Heart:regular rate and rhythm, without any murmurs or gallops.   Lung: Clear to auscultation without any rhonchi, wheezes or dullness to percussion. Abdomin: Soft, nontender without any shifting dullness or ascites. Musculoskeletal: No clubbing or cyanosis. Neurological: No motor or sensory deficits. Skin: No rashes or lesions.     Lab Results: Lab Results  Component Value Date   WBC 6.2 05/13/2020   HGB 13.1 05/13/2020   HCT 41.7 05/13/2020   MCV 87.6 05/13/2020   PLT 238 05/13/2020     Chemistry      Component Value Date/Time   NA 141 05/13/2020 1808   NA 144 05/11/2016 1450   NA 139 02/18/2015 1440   K 3.7 05/13/2020 1808   K 4.2 02/18/2015 1440   CL 104 05/13/2020 1808   CL 103 07/21/2012 1415   CO2 25 05/13/2020 1808   CO2 29 02/18/2015 1440   BUN 18 05/13/2020 1808   BUN 21 05/11/2016 1450   BUN 19.1 02/18/2015 1440   CREATININE 1.08 (H) 05/13/2020 1808   CREATININE 0.9 02/18/2015 1440      Component Value Date/Time   CALCIUM 9.8 05/13/2020 1808   CALCIUM 10.2 02/18/2015 1440   ALKPHOS 112 05/13/2020 1808   ALKPHOS 89 02/18/2015 1440   AST 97 (H) 05/13/2020 1808   AST 20 02/18/2015 1440   ALT 45 (H) 05/13/2020 1808   ALT 16 02/18/2015 1440   BILITOT 1.3 (H) 05/13/2020 1808   BILITOT 0.37 02/18/2015 1440     MPRESSION: 1. New  tiny bilateral pleural effusions. Otherwise stable exam. No new or progressive findings to suggest recurrent or metastatic disease in the chest, abdomen, or pelvis. 2. Cholelithiasis. 3. Left renal cysts. 4. Aortic Atherosclerosis (ICD10-I70.0). 75 year old woman with:  1.  T3a clear-cell renal cell carcinoma diagnosed after radical nephrectomy on Jul 31, 2019.    Disease status was updated at this time and treatment options were reviewed.  She is currently on active surveillance after surgical resection and she is approaching 1 year out from her surgery.  Imaging studies obtained and February 2022 did not show any evidence of metastatic disease at this time.  The role  for adjuvant immunotherapy was discussed at this time and it is unclear whether she would benefit given the fact that her nephrectomy was a year ago.  At this time I recommended continued active surveillance and use systemic therapy only if she develops relapsed disease.   He is agreeable at this time.  2.  Follow-up: In 6 months for repeat follow-up.  30  minutes were spent on this visit.  The time was dedicated to reviewing her disease status, reviewing imaging studies and future plan of care discussion. Zola Button 4/13/202210:22 AM

## 2020-07-04 DIAGNOSIS — E039 Hypothyroidism, unspecified: Secondary | ICD-10-CM | POA: Diagnosis not present

## 2020-07-04 DIAGNOSIS — R944 Abnormal results of kidney function studies: Secondary | ICD-10-CM | POA: Diagnosis not present

## 2020-07-04 DIAGNOSIS — I10 Essential (primary) hypertension: Secondary | ICD-10-CM | POA: Diagnosis not present

## 2020-07-04 DIAGNOSIS — G4700 Insomnia, unspecified: Secondary | ICD-10-CM | POA: Diagnosis not present

## 2020-07-04 DIAGNOSIS — I2510 Atherosclerotic heart disease of native coronary artery without angina pectoris: Secondary | ICD-10-CM | POA: Diagnosis not present

## 2020-07-04 DIAGNOSIS — E7800 Pure hypercholesterolemia, unspecified: Secondary | ICD-10-CM | POA: Diagnosis not present

## 2020-07-09 DIAGNOSIS — G8929 Other chronic pain: Secondary | ICD-10-CM | POA: Diagnosis not present

## 2020-07-09 DIAGNOSIS — M170 Bilateral primary osteoarthritis of knee: Secondary | ICD-10-CM | POA: Diagnosis not present

## 2020-07-09 DIAGNOSIS — M792 Neuralgia and neuritis, unspecified: Secondary | ICD-10-CM | POA: Diagnosis not present

## 2020-07-09 DIAGNOSIS — M47816 Spondylosis without myelopathy or radiculopathy, lumbar region: Secondary | ICD-10-CM | POA: Diagnosis not present

## 2020-07-09 DIAGNOSIS — M797 Fibromyalgia: Secondary | ICD-10-CM | POA: Diagnosis not present

## 2020-07-10 ENCOUNTER — Telehealth: Payer: Self-pay | Admitting: Pulmonary Disease

## 2020-07-10 ENCOUNTER — Other Ambulatory Visit: Payer: Self-pay | Admitting: Pulmonary Disease

## 2020-07-10 DIAGNOSIS — M542 Cervicalgia: Secondary | ICD-10-CM | POA: Diagnosis not present

## 2020-07-10 DIAGNOSIS — G2581 Restless legs syndrome: Secondary | ICD-10-CM

## 2020-07-10 DIAGNOSIS — M25552 Pain in left hip: Secondary | ICD-10-CM | POA: Diagnosis not present

## 2020-07-10 DIAGNOSIS — M25551 Pain in right hip: Secondary | ICD-10-CM | POA: Diagnosis not present

## 2020-07-10 DIAGNOSIS — M25512 Pain in left shoulder: Secondary | ICD-10-CM | POA: Diagnosis not present

## 2020-07-10 NOTE — Telephone Encounter (Signed)
ATC patient-unable to leave vm due to mailbox being full.  Will call back.  

## 2020-07-11 DIAGNOSIS — K829 Disease of gallbladder, unspecified: Secondary | ICD-10-CM | POA: Diagnosis not present

## 2020-07-11 DIAGNOSIS — R002 Palpitations: Secondary | ICD-10-CM | POA: Diagnosis not present

## 2020-07-11 DIAGNOSIS — I10 Essential (primary) hypertension: Secondary | ICD-10-CM | POA: Diagnosis not present

## 2020-07-11 DIAGNOSIS — R053 Chronic cough: Secondary | ICD-10-CM | POA: Diagnosis not present

## 2020-07-11 NOTE — Telephone Encounter (Signed)
Attempted to call pt but unable to reach. Unable to leave a VM due to mailbox being full. Due to [place] trying to call pt twice and unable to reach, per triage protocol encounter will be closed.

## 2020-07-14 DIAGNOSIS — R1011 Right upper quadrant pain: Secondary | ICD-10-CM | POA: Diagnosis not present

## 2020-07-14 DIAGNOSIS — R059 Cough, unspecified: Secondary | ICD-10-CM | POA: Diagnosis not present

## 2020-07-14 DIAGNOSIS — R8279 Other abnormal findings on microbiological examination of urine: Secondary | ICD-10-CM | POA: Diagnosis not present

## 2020-07-15 ENCOUNTER — Encounter: Payer: Medicare Other | Admitting: Psychology

## 2020-07-16 ENCOUNTER — Telehealth: Payer: Self-pay | Admitting: Pulmonary Disease

## 2020-07-16 NOTE — Telephone Encounter (Signed)
I called and spoke with patient regarding message. Patient stated she had a CXR done at the walk in clinic and is trying to get a copy so Dr. Elsworth Soho can see it. She has called the walk-in clinic and is waiting to hear back. I told patient she can get a copy and bring by the office or have them fax it to our office and gave her our fax number. She verbalized understanding and will call back. Will await call.

## 2020-07-16 NOTE — Telephone Encounter (Signed)
Checked pt's chart to see if we were able to see the results of pt's cxr that was performed at PCP and we are not. If she did have a recent cxr, we will need pt to get those results so Dr. Elsworth Soho can be able to review.  Attempted to call pt but unable to reach. Left message for her to return call.

## 2020-07-16 NOTE — Telephone Encounter (Signed)
Pt returning a phone call. Pt can be reached at [phone].

## 2020-07-17 DIAGNOSIS — M25511 Pain in right shoulder: Secondary | ICD-10-CM | POA: Diagnosis not present

## 2020-07-17 DIAGNOSIS — M25552 Pain in left hip: Secondary | ICD-10-CM | POA: Diagnosis not present

## 2020-07-22 ENCOUNTER — Other Ambulatory Visit: Payer: Self-pay

## 2020-07-22 ENCOUNTER — Ambulatory Visit: Payer: Medicare Other | Admitting: Pulmonary Disease

## 2020-07-22 ENCOUNTER — Ambulatory Visit (INDEPENDENT_AMBULATORY_CARE_PROVIDER_SITE_OTHER): Payer: Medicare Other | Admitting: Adult Health

## 2020-07-22 ENCOUNTER — Ambulatory Visit (INDEPENDENT_AMBULATORY_CARE_PROVIDER_SITE_OTHER): Payer: Medicare Other

## 2020-07-22 ENCOUNTER — Encounter: Payer: Self-pay | Admitting: Adult Health

## 2020-07-22 ENCOUNTER — Encounter: Payer: Medicare Other | Admitting: Psychology

## 2020-07-22 VITALS — BP 104/64 | HR 87 | Temp 97.9°F | Ht 64.0 in | Wt 217.2 lb

## 2020-07-22 DIAGNOSIS — R053 Chronic cough: Secondary | ICD-10-CM

## 2020-07-22 DIAGNOSIS — R059 Cough, unspecified: Secondary | ICD-10-CM | POA: Diagnosis not present

## 2020-07-22 MED ORDER — BENZONATATE 200 MG PO CAPS: 200.0000 mg | ORAL_CAPSULE | Freq: Three times a day (TID) | ORAL | 1 refills | Status: DC | PRN

## 2020-07-22 NOTE — Progress Notes (Signed)
@Patient  ID: [name], female    DOB: 1945/05/13, 75 y.o.   MRN: [id number]  Chief Complaint  Patient presents with  . Follow-up    Referring provider: Leighton Ruff, MD  HPI: 75 year old female never smoker  followed for restless leg syndrome and obstructive sleep apnea-CPAP intolerant Medical problems significant for depression  TEST/EVENTS :  NPSG 2004: AHI 10/hr PSG 07/2015 (214 lbs) AHI 23/h, mild PLMs 23/h but no sig arousals  CPAP titration 09/2015 9 cm  HST 11/2016 AHI 12/h, 7h TST  07/22/2020 Acute OV : Cough and Abnormal Chest xray  Patient presents for a work in visit.  Patient is followed for restless leg syndrome on Requip.  She has obstructive sleep apnea but has been intolerant to CPAP in the past. Patient presents today for a new problem for cough for last 3 months . Seen by PCP x 2 for Bronchitis treated with 2 antibiotics -Zpack and Augmentin . And 2 courses of steroids . Did not get better.  Describes cough as dry and minimally productive . No hemoptysis . Coughs all the time, worse at night . Was given albuterol inhaler but has not used. Does have chronic dyspnea with heavy /prolonged walking -feels she is out of shape.  Worried she may have mold in home, is getting her house checked .  Currently not taking any cough meds.  Has post nasal drainage and drippy nose.  No GERD .  Has not have Covid 19 infection . Fully vaccinated.  Had child hood asthma .  Some intermittent wheezing and tightness.  Retired Personnel officer work.   1 indoor cat. No birds/chickens. No Basement /hot tub . No travel . Paints with acrylic paints- but rarely.  From [place] . Lived locally x 33yrs  No amio/mtx use.  History of Kidney cancer s/p resection on right  2021.       Allergies  Allergen Reactions  . Geodon [Ziprasidone Hydrochloride] Other (See Comments)    Extremely agitated  . Lithium Nausea Only and Other (See Comments)    Off balance, increased heart  rate  . Talwin [Pentazocine] Other (See Comments)    Hallucinations   . Toradol [Ketorolac Tromethamine] Other (See Comments)    Chest pains  . Ziprasidone Other (See Comments)    Extremely agitated  . Abilify [Aripiprazole] Other (See Comments)    jerking  . Compazine [Prochlorperazine Edisylate] Nausea And Vomiting  . Latuda [Lurasidone Hcl] Other (See Comments)    Reports made her mind race more and irritable   . Pristiq [Desvenlafaxine Succinate Er] Other (See Comments)    Did not work, prefers not to take  . Rexulti [Brexpiprazole] Swelling    Elevated BP, created aggression  . Diflucan [Fluconazole] Nausea And Vomiting    Immunization History  Administered Date(s) Administered  . Influenza Whole 12/21/2007, 03/22/2009  . Influenza, High Dose Seasonal PF 11/15/2017, 05/26/2018, 01/19/2019  . Influenza,inj,Quad PF,6+ Mos 12/23/2010, 01/20/2013, 05/22/2015, 11/29/2016  . Influenza-Unspecified 12/13/2012, 12/20/2013, 12/04/2015  . PFIZER(Purple Top)SARS-COV-2 Vaccination 07/02/2019, 07/23/2019  . Pneumococcal Conjugate-13 10/15/2015  . Pneumococcal Polysaccharide-23 03/04/2004, 04/21/2013, 08/01/2019  . Tdap 01/23/2010  . Zoster 03/02/2012, 12/20/2012    Past Medical History:  Diagnosis Date  . Abscess of Bartholin's gland   . Acute vestibular neuronitis    Dr Lucia Gaskins  . ADHD (attention deficit hyperactivity disorder)   . Adverse effect of general anesthetic    felt paralyzed while receiving anesthesia  . Anxiety   . Bipolar 1  disorder ([place])   . CAD (coronary artery disease), native coronary artery    cath 05/2016 showing 50-70% stenosis in the mid LAD proximal to the first diagonal and 70-80% small OM1. FFR of LAD  not performed because of difficulty with catheter control from the right radial.   . Chronic kidney disease    kidney cancer- pt states she has elected to not have it treated.  . Depression    Bipolar disorder/goes to [facility] for meds  . Difficult  intubation    told by [facility] that she was hard to intubate 15b yrs ago in [place]- surgery since then no problems  . Dyspnea   . Dysrhythmia    tachycardia  . GERD (gastroesophageal reflux disease)   . H/O echocardiogram 07/2012   [place] LVF w grade I siastolic dysfunction   . History of attention deficit disorder   . History of endometriosis   . History of pleural effusion   . Hyperlipidemia LDL goal <70 06/24/2016  . Hypertension   . 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 Memory difficulty 01/26/2017  . Migraines   . Obesity   . OSA (obstructive sleep apnea)    uses oral appliance instead of CPAP  . PAT (paroxysmal atrial tachycardia) (HCC)    s/p ablation  . PONV (postoperative nausea and vomiting)   . PTSD (post-traumatic stress disorder)   . PVC's (premature ventricular contractions)   . Renal mass 02/15/2012  . RLS (restless legs syndrome)    Dr Gwenette Greet  . rt renal ca dx'd 12/2009   no treatment/ no surg  . Vertigo     Tobacco History: Social History   Tobacco Use  Smoking Status Never Smoker  Smokeless Tobacco Never Used   Counseling given: Not Answered   Outpatient Medications Prior to Visit  Medication Sig Dispense Refill  . b complex vitamins tablet Take 1 tablet by mouth daily.    . diphenhydrAMINE (BENADRYL) 25 mg capsule Take 25 mg by mouth every 6 (six) hours as needed for allergies.    . DULoxetine (CYMBALTA) 60 MG capsule Take 60 mg by mouth daily.    . fluticasone (FLONASE) 50 MCG/ACT nasal spray Place 2 sprays into both nostrils as needed.     Marland Kitchen levothyroxine (SYNTHROID) 88 MCG tablet 1 tablet in the morning on an empty stomach    . LORazepam (ATIVAN) 0.5 MG tablet TAKE 1 TABLET (0.5 MG TOTAL) BY MOUTH 3 (THREE) TIMES DAILY AS NEEDED FOR ANXIETY. 90 tablet 0  . losartan-hydrochlorothiazide (HYZAAR) 50-12.5 MG tablet TAKE 1 TABLET BY MOUTH EVERY DAY 90 tablet 2  . Menthol, Topical Analgesic, (BLUE GEL EX) Apply 1 application  topically every 6 (six) hours as needed (restless legs). BLUE ICE PAIN 3% Menthol    . Omega 3 1000 MG CAPS Take 1,000 mg by mouth daily.    . ondansetron (ZOFRAN ODT) 4 MG disintegrating tablet Take 1 tablet (4 mg total) by mouth every 8 (eight) hours as needed. 20 tablet 0  . polyethylene glycol powder (GLYCOLAX/MIRALAX) 17 GM/SCOOP powder Take 1 Container by mouth daily.    Marland Kitchen rOPINIRole (REQUIP) 0.5 MG tablet TAKE 1 TABLET BY MOUTH DAILY AFTER SUPPER & TAKE 2 TABLETS BY MOUTH AT BEDTIME 90 tablet 1  . rosuvastatin (CRESTOR) 5 MG tablet Take 1 tablet (5 mg total) by mouth daily. 90 tablet 3  . traZODone (DESYREL) 100 MG tablet Take 100 mg by mouth at bedtime.    Marland Kitchen  Vitamin D, Cholecalciferol, 50 MCG (2000 UT) CAPS Take 4,000 Units by mouth daily.     . methylphenidate (RITALIN) 5 MG tablet Take 1 tablet (5 mg total) by mouth 2 (two) times daily with breakfast and lunch. 60 tablet 0  . aspirin EC 81 MG tablet Take 81 mg by mouth every evening.     Marland Kitchen FLUoxetine (PROZAC) 40 MG capsule Take 40 mg by mouth daily.    . Ginger, Zingiber officinalis, (GINGER PO) Take 1 Dose by mouth 4 (four) times daily as needed (nausea/vomiting). Candied Ginger    . ondansetron (ZOFRAN) 8 MG tablet Take 8 mg by mouth every 8 (eight) hours as needed for nausea or vomiting.     Marland Kitchen oxyCODONE (ROXICODONE) 5 MG immediate release tablet Take 1 tablet (5 mg total) by mouth every 6 (six) hours as needed for severe pain. 8 tablet 0   No facility-administered medications prior to visit.     Review of Systems:   Constitutional:   No  weight loss, night sweats,  Fevers, chills, = fatigue, or  lassitude.  HEENT:   No headaches,  Difficulty swallowing,  Tooth/dental problems, or  Sore throat,                No sneezing, itching, ear ache,  +nasal congestion, post nasal drip,   CV:  No chest pain,  Orthopnea, PND, swelling in lower extremities, anasarca, dizziness, palpitations, syncope.   GI  No heartburn, indigestion,  abdominal pain, nausea, vomiting, diarrhea, change in bowel habits, loss of appetite, bloody stools.   Resp:  No chest wall deformity  Skin: no rash or lesions.  GU: no dysuria, change in color of urine, no urgency or frequency.  No flank pain, no hematuria   MS:  No joint pain or swelling.  No decreased range of motion.  No back pain.    Physical Exam  BP 104/64 (BP Location: Left Arm, Patient Position: Sitting, Cuff Size: Large)   Pulse 87   Temp 97.9 F (36.6 C) (Temporal)   Ht 5\' 4"  (1.626 m)   Wt 217 lb 3.2 oz (98.5 kg)   SpO2 97%   BMI 37.28 kg/m   GEN: A/Ox3; pleasant , NAD, well nourished    HEENT:  [place]/AT,   NOSE-clear, THROAT-clear, no lesions, no postnasal drip or exudate noted.   NECK:  Supple w/ fair ROM; no JVD; normal carotid impulses w/o bruits; no thyromegaly or nodules palpated; no lymphadenopathy.    RESP  Clear  P & A; w/o, wheezes/ rales/ or rhonchi. no accessory muscle use, no dullness to percussion  CARD:  RRR, no m/r/g, no peripheral edema, pulses intact, no cyanosis or clubbing.  GI:   Soft & nt; nml bowel sounds; no organomegaly or masses detected.   Musco: Warm bil, no deformities or joint swelling noted.   Neuro: alert, no focal deficits noted.    Skin: Warm, no lesions or rashes    Lab Results:  CBC  No results found.    No flowsheet data found.  No results found for: NITRICOXIDE      Assessment & Plan:   No problem-specific Assessment & Plan notes found for this encounter.     Rexene Edison, NP 07/22/2020

## 2020-07-22 NOTE — Patient Instructions (Signed)
Begin Zyrtec 10mg  At bedtime   Begin Deslym 2 tsp Twice daily  For cough As needed   Tessalon Three times a day  For cough as needed  Sips of water to soothe throat , avoid throat clearing .  Chest xray today .  Follow up in 2 -3 weeks with Dr. Elsworth Soho  Or Levin Dagostino NP with PFT  Please contact office for sooner follow up if symptoms do not improve or worsen or seek emergency care

## 2020-07-24 NOTE — Assessment & Plan Note (Signed)
Post bronchitic cough/upper airway cough syndrome-possibly a post bronchitic cough Will need work-up.  Check chest x-ray today.  Begin cough control regimen along with trigger prevention.  Check PFTs on return She is already received antibiotics and steroids.  Hold on additional for now.  Plan  Patient Instructions  Begin Zyrtec 10mg  At bedtime   Begin Deslym 2 tsp Twice daily  For cough As needed   Tessalon Three times a day  For cough as needed  Sips of water to soothe throat , avoid throat clearing .  Chest xray today .  Follow up in 2 -3 weeks with Dr. Elsworth Soho  Or Lanell Carpenter NP with PFT  Please contact office for sooner follow up if symptoms do not improve or worsen or seek emergency care

## 2020-07-27 ENCOUNTER — Encounter: Payer: Self-pay | Admitting: *Deleted

## 2020-07-28 DIAGNOSIS — R0982 Postnasal drip: Secondary | ICD-10-CM | POA: Diagnosis not present

## 2020-07-28 DIAGNOSIS — M25552 Pain in left hip: Secondary | ICD-10-CM | POA: Diagnosis not present

## 2020-07-28 DIAGNOSIS — M25551 Pain in right hip: Secondary | ICD-10-CM | POA: Diagnosis not present

## 2020-07-28 DIAGNOSIS — B029 Zoster without complications: Secondary | ICD-10-CM | POA: Diagnosis not present

## 2020-07-28 DIAGNOSIS — M25511 Pain in right shoulder: Secondary | ICD-10-CM | POA: Diagnosis not present

## 2020-07-28 DIAGNOSIS — M25512 Pain in left shoulder: Secondary | ICD-10-CM | POA: Diagnosis not present

## 2020-07-31 ENCOUNTER — Telehealth (HOSPITAL_COMMUNITY): Payer: Self-pay | Admitting: Psychiatry

## 2020-08-02 ENCOUNTER — Other Ambulatory Visit: Payer: Self-pay | Admitting: Pulmonary Disease

## 2020-08-02 DIAGNOSIS — G2581 Restless legs syndrome: Secondary | ICD-10-CM

## 2020-08-04 ENCOUNTER — Ambulatory Visit: Payer: Medicare Other

## 2020-08-04 ENCOUNTER — Other Ambulatory Visit: Payer: Self-pay

## 2020-08-04 ENCOUNTER — Encounter: Payer: Self-pay | Admitting: Physician Assistant

## 2020-08-04 ENCOUNTER — Encounter: Payer: Self-pay | Admitting: Cardiology

## 2020-08-04 ENCOUNTER — Ambulatory Visit (INDEPENDENT_AMBULATORY_CARE_PROVIDER_SITE_OTHER): Payer: Medicare Other | Admitting: Physician Assistant

## 2020-08-04 VITALS — BP 118/70 | HR 72 | Ht 64.0 in | Wt 217.8 lb

## 2020-08-04 DIAGNOSIS — R0789 Other chest pain: Secondary | ICD-10-CM

## 2020-08-04 DIAGNOSIS — R002 Palpitations: Secondary | ICD-10-CM

## 2020-08-04 DIAGNOSIS — I2510 Atherosclerotic heart disease of native coronary artery without angina pectoris: Secondary | ICD-10-CM | POA: Diagnosis not present

## 2020-08-04 DIAGNOSIS — E785 Hyperlipidemia, unspecified: Secondary | ICD-10-CM | POA: Diagnosis not present

## 2020-08-04 DIAGNOSIS — Z01810 Encounter for preprocedural cardiovascular examination: Secondary | ICD-10-CM | POA: Diagnosis not present

## 2020-08-04 DIAGNOSIS — I471 Supraventricular tachycardia: Secondary | ICD-10-CM | POA: Diagnosis not present

## 2020-08-04 DIAGNOSIS — R072 Precordial pain: Secondary | ICD-10-CM | POA: Diagnosis not present

## 2020-08-04 DIAGNOSIS — F32A Depression, unspecified: Secondary | ICD-10-CM

## 2020-08-04 DIAGNOSIS — Z79899 Other long term (current) drug therapy: Secondary | ICD-10-CM

## 2020-08-04 DIAGNOSIS — I10 Essential (primary) hypertension: Secondary | ICD-10-CM | POA: Diagnosis not present

## 2020-08-04 LAB — BASIC METABOLIC PANEL
BUN/Creatinine Ratio: 31 — ABNORMAL HIGH (ref 12–28)
BUN: 39 mg/dL — ABNORMAL HIGH (ref 8–27)
CO2: 25 mmol/L (ref 20–29)
Calcium: 10 mg/dL (ref 8.7–10.3)
Chloride: 103 mmol/L (ref 96–106)
Creatinine, Ser: 1.24 mg/dL — ABNORMAL HIGH (ref 0.57–1.00)
Glucose: 77 mg/dL (ref 65–99)
Potassium: 4.3 mmol/L (ref 3.5–5.2)
Sodium: 141 mmol/L (ref 134–144)
eGFR: 45 mL/min/{1.73_m2} — ABNORMAL LOW (ref >59)

## 2020-08-04 MED ORDER — LOSARTAN POTASSIUM-HCTZ 50-12.5 MG PO TABS: 1.0000 | ORAL_TABLET | Freq: Two times a day (BID) | ORAL | 3 refills | Status: DC

## 2020-08-04 NOTE — Progress Notes (Unsigned)
Patient enrolled for [employer] to ship a 14 day ZIO XT long term holter monitor to her home. 

## 2020-08-04 NOTE — Patient Instructions (Signed)
Medication Instructions:   Your Physician Assistant is okay with you to continue to increase your Losartan-Hydrochlorothiazide (Hyzaar) 50 mg-12.5 mg to 2 times a day  *If you need a refill on your cardiac medications before your next appointment, please call your pharmacy*   Lab Work: Your physician recommends that you return for lab work TODAY:   BMET If you have labs (blood work) drawn today and your tests are completely normal, you will receive your results only by: Marland Kitchen MyChart Message (if you have MyChart) OR . A paper copy in the mail If you have any lab test that is abnormal or we need to change your treatment, we will call you to review the results.  Testing/Procedures: Your physician has requested that you have a lexiscan myoview. For further information please visit [email or web address]. Please follow instruction sheet, as given.  Please schedule for 1-2 weeks   ZIO XT- Long Term Monitor Instructions   Your physician has requested you wear a ZIO patch monitor for  14 days.  This is a single patch monitor.   [employer] supplies one patch monitor per enrollment. Additional stickers are not available. Please do not apply patch if you will be having a Nuclear Stress Test, Echocardiogram, Cardiac CT, MRI, or Chest Xray during the period you would be wearing the monitor. The patch cannot be worn during these tests. You cannot remove and re-apply the ZIO XT patch monitor.  Your ZIO patch monitor will be sent Fed Ex from [employer] directly to your home address. It may take 3-5 days to receive your monitor after you have been enrolled.  Once you have received your monitor, please review the enclosed instructions. Your monitor has already been registered assigning a specific monitor serial # to you.  Billing and Patient Assistance Program Information   We have supplied [employer] with any of your insurance information on file for billing purposes. [employer] offers a sliding scale  Patient Assistance Program for patients that do not have insurance, or whose insurance does not completely cover the cost of the ZIO monitor.   You must apply for the Patient Assistance Program to qualify for this discounted rate.     To apply, please call [employer] at [phone], select option 4, then select option 2, and ask to apply for Patient Assistance Program.  Theodore Demark will ask your household income, and how many people are in your household.  They will quote your out-of-pocket cost based on that information.  [employer] will also be able to set up a 61-month, interest-free payment plan if needed.  Applying the monitor   Shave hair from upper left chest.  Hold abrader disc by orange tab. Rub abrader in 40 strokes over the upper left chest as indicated in your monitor instructions.  Clean area with 4 enclosed alcohol pads. Let dry.  Apply patch as indicated in monitor instructions. Patch will be placed under collarbone on left side of chest with arrow pointing upward.  Rub patch adhesive wings for 2 minutes. Remove white label marked "1". Remove the white label marked "2". Rub patch adhesive wings for 2 additional minutes.  While looking in a mirror, press and release button in center of patch. A small green light will flash 3-4 times. This will be your only indicator that the monitor has been turned on. ?  Do not shower for the first 24 hours. You may shower after the first 24 hours.  Press the button if you feel a symptom. You will hear a  small click. Record Date, Time and Symptom in the Patient Logbook.  When you are ready to remove the patch, follow instructions on the last 2 pages of the Patient Logbook. Stick patch monitor onto the last page of Patient Logbook.  Place Patient Logbook in the blue and white box.  Use locking tab on box and tape box closed securely.  The blue and white box has prepaid postage on it. Please place it in the mailbox as soon as possible. Your physician should have  your test results approximately 7 days after the monitor has been mailed back to [facility].  Call [place] at [phone] if you have questions regarding your ZIO XT patch monitor. Call them immediately if you see an orange light blinking on your monitor.  If your monitor falls off in less than 4 days, contact our Monitor department at [phone]. ?If your monitor becomes loose or falls off after 4 days call [employer] at [phone] for suggestions on securing your monitor.?   Follow-Up: At [facility], you and your health needs are our priority.  As part of our continuing mission to provide you with exceptional heart care, we have created designated Provider Care Teams.  These Care Teams include your primary Cardiologist (physician) and Advanced Practice Providers (APPs -  Physician Assistants and Nurse Practitioners) who all work together to provide you with the care you need, when you need it.  Your next appointment:   3 month(s)  The format for your next appointment:   In Person  Provider:   Buford Dresser, MD  Other Instructions

## 2020-08-04 NOTE — Progress Notes (Signed)
Cardiology Office Note:    Date:  08/06/2020   ID:  [name], DOB 05/07/1945, MRN [id number]  PCP:  [facility] @ [place] Providers Cardiologist:  Buford Dresser, MD {    Referring MD: Leighton Ruff, MD   Chief Complaint  Patient presents with  . Follow-up    Seen for Dr. Harrell Gave    History of Present Illness:    [name] is a 75 y.o. female with a hx of paroxysmal atrial tachycardia s/p ablation, CAD, hypertension, PVCs, renal cell carcinoma s/p nephrectomy, ADHD, bipolar 1 disorder, obstructive sleep apnea and hyperlipidemia.  Previous coronary CTA obtained in 2018 showed coronary calcium score of 364, with one-vessel CAD with moderate disease in the ostial to proximal LAD, moderate to severe disease in mid LAD.  She subsequently underwent cardiac catheterization that showed 50 to 70% mid LAD lesion proximal to D1, 75 to 80% mid OM lesion with luminal irregularities in mid RCA, normal EF.  Medical therapy was recommended.  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  After her right knee resection surgery, she recovered very slowly.  She is due for laparoscopic cholecystectomy surgery by Dr. Romana Juniper of [facility] surgery.   Patient presents today for evaluation of multiple issues.  She reportedly had a chest x-ray recently at [facility] that revealed a lung nodule and is due for CT image.  However chest x-ray obtained on 07/22/2020 by pulmonology service does not show any lung nodule.  Her blood pressure has been elevated in the 150-170s range, interestingly, when I take a look at her blood pressure reading in the last few office visit recently, they were always normal.  She says she has doubled up her losartan-hydrochlorothiazide to twice a day dosing for over a month  now and this has made her blood pressure better.  I will continue on this dosing however she will need a basic metabolic panel.  She has severe anxiety and the depression.  She is living with her former fianc who also has untreated severe depression as well.  She has no motivation to do anything and does not exert herself.  She has occasionally tingling sensation in the chest, sometimes fairly atypical.  She is due for multiple surgeries coming up.  Most invasive being laparoscopic cholecystectomy, I discussed the case with Dr. Martin[place] DOD, we recommend a nuclear stress test to risk stratify her.  She also complaining of palpitation which can last anywhere between 1 to 3 seconds, she does feel woozy during the palpitation, but no feeling of passing out.  She has old Bystolic in her purse, she has not taken Bystolic in over a year.  Given her severe depression, I do not recommend additional beta-blocker at this time.  I asked her to hold off on taking the Bystolic unless her palpitation become really prolonged.  Otherwise I would like a 2-week heart monitor to assess her palpitation first before focusing on treatment.  She lost her psychiatrist recently and will need to be set up with a new psychiatrist.  Past Medical History:  Diagnosis Date  . Abscess of Bartholin's gland   . Acute vestibular neuronitis    Dr Lucia Gaskins  . ADHD (attention deficit hyperactivity disorder)   . Adverse effect of general anesthetic    felt paralyzed while  receiving anesthesia  . Anxiety   . Bipolar 1 disorder ([place])   . CAD (coronary artery disease), native coronary artery    cath 05/2016 showing 50-70% stenosis in the mid LAD proximal to the first diagonal and 70-80% small OM1. FFR of LAD  not performed because of difficulty with catheter control from the right radial.   . Chronic kidney disease    kidney cancer- pt states she has elected to not have it treated.  . Depression    Bipolar disorder/goes to [facility] for  meds  . Difficult intubation    told by [facility] that she was hard to intubate 15b yrs ago in [place]- surgery since then no problems  . Dyspnea   . Dysrhythmia    tachycardia  . GERD (gastroesophageal reflux disease)   . H/O echocardiogram 07/2012   [place] LVF w grade I siastolic dysfunction   . History of attention deficit disorder   . History of endometriosis   . History of pleural effusion   . Hyperlipidemia LDL goal <70 06/24/2016  . Hypertension   . 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 Memory difficulty 01/26/2017  . Migraines   . Obesity   . OSA (obstructive sleep apnea)    uses oral appliance instead of CPAP  . PAT (paroxysmal atrial tachycardia) (HCC)    s/p ablation  . PONV (postoperative nausea and vomiting)   . PTSD (post-traumatic stress disorder)   . PVC's (premature ventricular contractions)   . Renal mass 02/15/2012  . RLS (restless legs syndrome)    Dr Gwenette Greet  . rt renal ca dx'd 12/2009   no treatment/ no surg  . Vertigo     Past Surgical History:  Procedure Laterality Date  . CARDIAC ELECTROPHYSIOLOGY [place] AND ABLATION  2000s  . LAPAROSCOPIC NEPHRECTOMY, HAND ASSISTED Right 07/31/2019   Procedure: HAND ASSISTED LAPAROSCOPIC NEPHRECTOMY CONVERTED TO OPEN WITH REPAIR OF INFERIOR VENA CAVAOTOMY;  Surgeon: Robley Fries, MD;  Location: [facility];  Service: Urology;  Laterality: Right;  3 HRS  . laparoscopy    . LEFT HEART CATH AND CORONARY ANGIOGRAPHY N/A 06/08/2016   Procedure: Left Heart Cath and Coronary Angiography;  Surgeon: Belva Crome, MD;  Location: [facility];  Service: Cardiovascular;  Laterality: N/A;  . nasoseptal reconstruction  1990s  . NEPHRECTOMY RECIPIENT Right 08/2019   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  . THYROIDECTOMY  1990  . TONSILLECTOMY     as a child  . TUBAL LIGATION  1970s  . uterine mass removal  03/2012   was found to be benign    Current Medications: Current Meds  Medication Sig  . b  complex vitamins tablet Take 1 tablet by mouth daily.  . diphenhydrAMINE (BENADRYL) 25 mg capsule Take 25 mg by mouth every 6 (six) hours as needed for allergies.  . DULoxetine (CYMBALTA) 60 MG capsule Take 60 mg by mouth daily.  . fluticasone (FLONASE) 50 MCG/ACT nasal spray Place 2 sprays into both nostrils as needed.   Marland Kitchen levothyroxine (SYNTHROID) 88 MCG tablet 1 tablet in the morning on an empty stomach  . LORazepam (ATIVAN) 0.5 MG tablet TAKE 1 TABLET (0.5 MG TOTAL) BY MOUTH 3 (THREE) TIMES DAILY AS NEEDED FOR ANXIETY.  . Menthol, Topical Analgesic, (BLUE GEL EX) Apply 1 application topically every 6 (six) hours as needed (restless legs). BLUE ICE PAIN 3% Menthol  . methylphenidate (RITALIN) 5 MG tablet Take 1 tablet (5 mg  total) by mouth 2 (two) times daily with breakfast and lunch.  . Omega 3 1000 MG CAPS Take 1,000 mg by mouth daily.  . ondansetron (ZOFRAN ODT) 4 MG disintegrating tablet Take 1 tablet (4 mg total) by mouth every 8 (eight) hours as needed.  . polyethylene glycol powder (GLYCOLAX/MIRALAX) 17 GM/SCOOP powder Take 1 Container by mouth as needed.  Marland Kitchen rOPINIRole (REQUIP) 0.5 MG tablet TAKE 1 TABLET BY MOUTH DAILY AFTER SUPPER & TAKE 2 TABLETS BY MOUTH AT BEDTIME  . rosuvastatin (CRESTOR) 5 MG tablet Take 1 tablet (5 mg total) by mouth daily.  . traZODone (DESYREL) 100 MG tablet Take 100 mg by mouth at bedtime.  . Vitamin D, Cholecalciferol, 50 MCG (2000 UT) CAPS Take 4,000 Units by mouth daily.   . [DISCONTINUED] losartan-hydrochlorothiazide (HYZAAR) 50-12.5 MG tablet TAKE 1 TABLET BY MOUTH EVERY DAY (Patient taking differently: 2 (two) times daily.)     Allergies:   Geodon [ziprasidone hydrochloride], Lithium, Talwin [pentazocine], Toradol [ketorolac tromethamine], Ziprasidone, Abilify [aripiprazole], Compazine [prochlorperazine edisylate], Latuda [lurasidone hcl], Pristiq [desvenlafaxine succinate er], Rexulti [brexpiprazole], and Diflucan [fluconazole]   Social History    Socioeconomic History  . Marital status: Single    Spouse name: Not on file  . Number of children: 1  . Years of education: 4  . Highest education level: Not on file  Occupational History  . 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 Smoking status: Never Smoker  . Smokeless tobacco: Never Used  Vaping Use  . Vaping Use: Never used  Substance and Sexual Activity  . Alcohol use: Not Currently    Alcohol/week: 1.0 standard drink    Types: 1 Glasses of wine per week  . Drug use: No  . Sexual activity: Yes    Partners: Male    Birth control/protection: None  Other Topics Concern  . Not on file  Social History Narrative   Divorced and lives alone   Has children   Caffeine use: none   Right handed    Social Determinants of Health   Financial Resource Strain: Not on file  Food Insecurity: Not on file  Transportation Needs: Not on file  Physical Activity: Not on file  Stress: Not on file  Social Connections: Not on file     Family History: The patient's family history includes Alcohol abuse in her father; Allergies in her brother, daughter, father, and sister; Bipolar disorder in her father; CAD in her mother; Cancer in her paternal grandfather; Colon cancer in her paternal grandmother; Depression in her mother; Heart disease in her maternal grandfather and mother; Hypertension in her mother; OCD in her paternal grandmother; Skin cancer in her father.  ROS:   Please see the history of present illness.     All other systems reviewed and are negative.  EKGs/Labs/Other Studies Reviewed:    The following studies were reviewed today:  Cath 06/08/2016  50-70% stenosis in the mid LAD proximal to the first diagonal. FFR not performed because of difficulty with catheter control from the right radial.   75-80% stenosis in the midsegment of the first obtuse marginal.   Luminal irregularities in the mid RCA.   Overall normal LV function with EF  percent.   Recommendations:    Aggressive risk factor modification and anti-ischemic therapy ( beta blocker, statin therapy and possibly LA nitrates) in this non-ACS subset. PCI in this setting does not meet appropriate use criteria.   If limiting symptoms on medications or convincing evidence  of ischemia, consider obtuse marginal PCI and/or higher than usual risk PCI on the LAD (due to calcification and angulation).     EKG:  EKG is ordered today.  The ekg ordered today demonstrates normal sinus rhythm without significant ST-T wave changes.  Recent Labs: 05/13/2020: ALT 45; Hemoglobin 13.1; Platelets 238 08/04/2020: BUN 39; Creatinine, Ser 1.24; Potassium 4.3; Sodium 141  Recent Lipid Panel    Component Value Date/Time   CHOL 177 05/26/2018 0625   TRIG 68 05/26/2018 0625   HDL 47 05/26/2018 0625   CHOLHDL 3.8 05/26/2018 0625   VLDL 14 05/26/2018 0625   LDLCALC 116 (H) 05/26/2018 0625     Risk Assessment/Calculations:       Physical Exam:    VS:  BP 118/70   Pulse 72   Ht 5\' 4"  (1.626 m)   Wt 217 lb 12.8 oz (98.8 kg)   SpO2 98%   BMI 37.39 kg/m     Wt Readings from Last 3 Encounters:  08/04/20 217 lb 12.8 oz (98.8 kg)  07/22/20 217 lb 3.2 oz (98.5 kg)  07/02/20 217 lb 6.4 oz (98.6 kg)     GEN:  Well nourished, well developed in no acute distress HEENT: Normal NECK: No JVD; No carotid bruits LYMPHATICS: No lymphadenopathy CARDIAC: RRR, no murmurs, rubs, gallops RESPIRATORY:  Clear to auscultation without rales, wheezing or rhonchi  ABDOMEN: Soft, non-tender, non-distended MUSCULOSKELETAL:  No edema; No deformity  SKIN: Warm and dry NEUROLOGIC:  Alert and oriented x 3 PSYCHIATRIC:  Normal affect   ASSESSMENT:    1. Preop cardiovascular exam   2. Palpitation   3. Atypical chest pain   4. Medication management   5. Precordial pain   6. PAT (paroxysmal atrial tachycardia) (HCC)   7. Coronary artery disease involving native coronary artery of native heart  without angina pectoris   8. Essential hypertension   9. Hyperlipidemia LDL goal <70   10. Depression, unspecified depression type    PLAN:    In order of problems listed above:  1. Preoperative clearance: She has upcoming laparoscopic cholecystectomy by Dr. Doree Barthel of [facility] surgery.  I discussed the case with DOD Dr. [place], we recommended nuclear stress test.  If stress test is negative, she may proceed with surgery.  2. Palpitation: 2-week heart monitor.   3. Atypical chest pain: See #1.  We will proceed with nuclear stress test  4. Paroxysmal atrial tachycardia s/p ablation: Recent palpitation.  Proceed with 2-week heart monitor  5. CAD: Denies any recent chest pain  6. Hypertension: Blood pressure stable  7. Hyperlipidemia: On Crestor  8. Major depression: She will need to establish with a new psychiatrist.  Her depression has clearly worsened recently.  Patient was tearful during today's visit.   Shared Decision Making/Informed Consent The risks [chest pain, shortness of breath, cardiac arrhythmias, dizziness, blood pressure fluctuations, myocardial infarction, stroke/transient ischemic attack, nausea, vomiting, allergic reaction, radiation exposure, metallic taste sensation and life-threatening complications (estimated to be 1 in 10,000)], benefits (risk stratification, diagnosing coronary artery disease, treatment guidance) and alternatives of a nuclear stress test were discussed in detail with Ms. Buikema and she agrees to proceed.       Medication Adjustments/Labs and Tests Ordered: Current medicines are reviewed at length with the patient today.  Concerns regarding medicines are outlined above.  Orders Placed This Encounter  Procedures  . Basic metabolic panel  . Cardiac Stress Test: Informed Consent Details: Physician/Practitioner Attestation; Transcribe to consent form and  obtain patient signature  . MYOCARDIAL PERFUSION IMAGING  . LONG TERM  MONITOR (3-14 DAYS)  . EKG 12-Lead   Meds ordered this encounter  Medications  . losartan-hydrochlorothiazide (HYZAAR) 50-12.5 MG tablet    Sig: Take 1 tablet by mouth 2 (two) times daily.    Dispense:  180 tablet    Refill:  3    Patient Instructions  Medication Instructions:   Your Physician Assistant is okay with you to continue to increase your Losartan-Hydrochlorothiazide (Hyzaar) 50 mg-12.5 mg to 2 times a day  *If you need a refill on your cardiac medications before your next appointment, please call your pharmacy*   Lab Work: Your physician recommends that you return for lab work TODAY:   BMET If you have labs (blood work) drawn today and your tests are completely normal, you will receive your results only by: Marland Kitchen MyChart Message (if you have MyChart) OR . A paper copy in the mail If you have any lab test that is abnormal or we need to change your treatment, we will call you to review the results.  Testing/Procedures: Your physician has requested that you have a lexiscan myoview. For further information please visit [email or web address]. Please follow instruction sheet, as given.  Please schedule for 1-2 weeks   ZIO XT- Long Term Monitor Instructions   Your physician has requested you wear a ZIO patch monitor for  14 days.  This is a single patch monitor.   [employer] supplies one patch monitor per enrollment. Additional stickers are not available. Please do not apply patch if you will be having a Nuclear Stress Test, Echocardiogram, Cardiac CT, MRI, or Chest Xray during the period you would be wearing the monitor. The patch cannot be worn during these tests. You cannot remove and re-apply the ZIO XT patch monitor.  Your ZIO patch monitor will be sent Fed Ex from [employer] directly to your home address. It may take 3-5 days to receive your monitor after you have been enrolled.  Once you have received your monitor, please review the enclosed instructions. Your monitor  has already been registered assigning a specific monitor serial # to you.  Billing and Patient Assistance Program Information   We have supplied [employer] with any of your insurance information on file for billing purposes. [employer] offers a sliding scale Patient Assistance Program for patients that do not have insurance, or whose insurance does not completely cover the cost of the ZIO monitor.   You must apply for the Patient Assistance Program to qualify for this discounted rate.     To apply, please call [employer] at [phone], select option 4, then select option 2, and ask to apply for Patient Assistance Program.  Theodore Demark will ask your household income, and how many people are in your household.  They will quote your out-of-pocket cost based on that information.  [employer] will also be able to set up a 34-month, interest-free payment plan if needed.  Applying the monitor   Shave hair from upper left chest.  Hold abrader disc by orange tab. Rub abrader in 40 strokes over the upper left chest as indicated in your monitor instructions.  Clean area with 4 enclosed alcohol pads. Let dry.  Apply patch as indicated in monitor instructions. Patch will be placed under collarbone on left side of chest with arrow pointing upward.  Rub patch adhesive wings for 2 minutes. Remove white label marked "1". Remove the white label marked "2". Rub patch adhesive wings for 2 additional  minutes.  While looking in a mirror, press and release button in center of patch. A small green light will flash 3-4 times. This will be your only indicator that the monitor has been turned on. ?  Do not shower for the first 24 hours. You may shower after the first 24 hours.  Press the button if you feel a symptom. You will hear a small click. Record Date, Time and Symptom in the Patient Logbook.  When you are ready to remove the patch, follow instructions on the last 2 pages of the Patient Logbook. Stick patch monitor onto the last page  of Patient Logbook.  Place Patient Logbook in the blue and white box.  Use locking tab on box and tape box closed securely.  The blue and white box has prepaid postage on it. Please place it in the mailbox as soon as possible. Your physician should have your test results approximately 7 days after the monitor has been mailed back to [facility].  Call [facility] at [phone] if you have questions regarding your ZIO XT patch monitor. Call them immediately if you see an orange light blinking on your monitor.  If your monitor falls off in less than 4 days, contact our Monitor department at [phone]. ?If your monitor becomes loose or falls off after 4 days call [employer] at [phone] for suggestions on securing your monitor.?   Follow-Up: At [facility], you and your health needs are our priority.  As part of our continuing mission to provide you with exceptional heart care, we have created designated Provider Care Teams.  These Care Teams include your primary Cardiologist (physician) and Advanced Practice Providers (APPs -  Physician Assistants and Nurse Practitioners) who all work together to provide you with the care you need, when you need it.  Your next appointment:   3 month(s)  The format for your next appointment:   In Person  Provider:   Buford Dresser, MD  Other Instructions      Signed, Almyra Deforest, [place]  08/06/2020 11:21 PM    [place]

## 2020-08-05 ENCOUNTER — Ambulatory Visit: Payer: Medicare Other | Admitting: Pulmonary Disease

## 2020-08-05 ENCOUNTER — Other Ambulatory Visit (HOSPITAL_COMMUNITY): Payer: Medicare Other

## 2020-08-05 ENCOUNTER — Telehealth (HOSPITAL_COMMUNITY): Payer: Self-pay | Admitting: Psychiatry

## 2020-08-06 ENCOUNTER — Encounter: Payer: Self-pay | Admitting: Physician Assistant

## 2020-08-06 DIAGNOSIS — B0229 Other postherpetic nervous system involvement: Secondary | ICD-10-CM | POA: Diagnosis not present

## 2020-08-06 DIAGNOSIS — H579 Unspecified disorder of eye and adnexa: Secondary | ICD-10-CM | POA: Diagnosis not present

## 2020-08-06 DIAGNOSIS — H9202 Otalgia, left ear: Secondary | ICD-10-CM | POA: Diagnosis not present

## 2020-08-07 DIAGNOSIS — H5213 Myopia, bilateral: Secondary | ICD-10-CM | POA: Diagnosis not present

## 2020-08-07 DIAGNOSIS — E7800 Pure hypercholesterolemia, unspecified: Secondary | ICD-10-CM | POA: Diagnosis not present

## 2020-08-07 DIAGNOSIS — H2513 Age-related nuclear cataract, bilateral: Secondary | ICD-10-CM | POA: Diagnosis not present

## 2020-08-07 DIAGNOSIS — R7303 Prediabetes: Secondary | ICD-10-CM | POA: Diagnosis not present

## 2020-08-07 DIAGNOSIS — B0229 Other postherpetic nervous system involvement: Secondary | ICD-10-CM | POA: Diagnosis not present

## 2020-08-08 ENCOUNTER — Telehealth (HOSPITAL_COMMUNITY): Payer: Self-pay | Admitting: *Deleted

## 2020-08-08 ENCOUNTER — Ambulatory Visit: Payer: Medicare Other | Admitting: Adult Health

## 2020-08-08 NOTE — Telephone Encounter (Signed)
Close encounter 

## 2020-08-12 ENCOUNTER — Other Ambulatory Visit: Payer: Self-pay

## 2020-08-12 ENCOUNTER — Ambulatory Visit (HOSPITAL_COMMUNITY)
Admission: RE | Admit: 2020-08-12 | Discharge: 2020-08-12 | Disposition: A | Discharge status: Home / Self Care | Payer: Medicare Other | Source: Ambulatory Visit | Attending: Cardiovascular Disease | Admitting: Cardiovascular Disease

## 2020-08-12 DIAGNOSIS — R0789 Other chest pain: Secondary | ICD-10-CM | POA: Diagnosis not present

## 2020-08-12 DIAGNOSIS — R072 Precordial pain: Secondary | ICD-10-CM | POA: Insufficient documentation

## 2020-08-12 LAB — MYOCARDIAL PERFUSION IMAGING
LV dias vol: 75 mL (ref 46–106)
LV sys vol: 28 mL
Peak HR: 103 {beats}/min
Rest HR: 75 {beats}/min
SDS: 0
SRS: 0
SSS: 0
TID: 1.29

## 2020-08-12 MED ORDER — TECHNETIUM TC 99M TETROFOSMIN IV KIT
31.9000 | PACK | Freq: Once | INTRAVENOUS | Status: AC | PRN
  Administered 2020-08-12: 31.9 via INTRAVENOUS
  Filled 2020-08-12: qty 32

## 2020-08-12 MED ORDER — REGADENOSON 0.4 MG/5ML IV SOLN
0.4000 mg | Freq: Once | INTRAVENOUS | Status: AC
  Administered 2020-08-12: 0.4 mg via INTRAVENOUS

## 2020-08-12 MED ORDER — TECHNETIUM TC 99M TETROFOSMIN IV KIT
10.8000 | PACK | Freq: Once | INTRAVENOUS | Status: AC | PRN
  Administered 2020-08-12: 10.8 via INTRAVENOUS
  Filled 2020-08-12: qty 11

## 2020-08-13 ENCOUNTER — Encounter: Payer: Self-pay | Admitting: Physician Assistant

## 2020-08-13 NOTE — Telephone Encounter (Signed)
Stress test low risk, I have typed up a clearance letter (see under letters in [place]) and e-faxed it to [facility] surgery

## 2020-08-22 ENCOUNTER — Ambulatory Visit (INDEPENDENT_AMBULATORY_CARE_PROVIDER_SITE_OTHER): Payer: Medicare Other | Admitting: Psychology

## 2020-08-22 ENCOUNTER — Encounter: Payer: Self-pay | Admitting: Psychology

## 2020-08-22 ENCOUNTER — Ambulatory Visit: Payer: Medicare Other | Admitting: Psychology

## 2020-08-22 ENCOUNTER — Other Ambulatory Visit: Payer: Self-pay

## 2020-08-22 DIAGNOSIS — F411 Generalized anxiety disorder: Secondary | ICD-10-CM

## 2020-08-22 DIAGNOSIS — F332 Major depressive disorder, recurrent severe without psychotic features: Secondary | ICD-10-CM | POA: Diagnosis not present

## 2020-08-22 DIAGNOSIS — R41840 Attention and concentration deficit: Secondary | ICD-10-CM

## 2020-08-22 DIAGNOSIS — R4189 Other symptoms and signs involving cognitive functions and awareness: Secondary | ICD-10-CM

## 2020-08-22 NOTE — Progress Notes (Signed)
NEUROPSYCHOLOGICAL EVALUATION [place] Department of Neurology  Date of Evaluation: August 22, 2020  Reason for Referral:   [name] is a 75 y.o. right-handed Caucasian female referred by Lona Kettle, M.D., to characterize her current cognitive functioning and assist with diagnostic clarity and treatment planning in the context of experienced brain fog and reported short-term memory dysfunction.   Assessment and Plan:   Clinical Impression(s): [name] pattern of performance is suggestive of a few isolated impairments across specific tasks but no consistent impairment across broad cognitive domains. Isolated impairments were seen across a computerized task assessing hypothesis testing and adaptability, an animal fluency task, and a list learning verbal memory task. However, performances across all other tasks assessing executive functioning, expressive language, and verbal/visual memory were appropriate respectively. Performance was also appropriate across processing speed, attention/concentration, receptive language, and visuospatial abilities. [name] generally denied difficulties completing instrumental activities of daily living (ADLs) independently.   Across mood-related questionnaires, she reported acute levels of severe anxiety and severe depression. She also reported moderate sleep dysfunction. Overall, the etiology of isolated weaknesses and subjective dysfunction in her day-to-day life is believed to be multifactorial in nature. At its base level, given her history of ADHD, there is believed to be ongoing attentional dysregulation. This dysregulation would be worsened by severe psychiatric distress, symptoms of diffuse chronic pain, sleep dysfunction due to untreated obstructive sleep apnea, and numerous other chronic medical conditions. Memory performances, despite some variability, are inconsistent with expected patterns in Alzheimer's  disease. Additionally, her cognitive and behavioral profile is not consistent with Lewy body or frontotemporal dementia presentations. The likelihood of a neurodegenerative cause for cognitive dysfunction is low. Overall, it is far more likely that a combination of ADHD, mood, pain, and sleep factors are directly creating and worsening ongoing dysfunction. Continued medical monitoring will be important moving forward.  Diagnoses: Attention deficit hyperactivity disorder (ADHD); major depressive disorder, recurrent, severe; generalized anxiety disorder.   Recommendations: Neuroimaging (i.e., brain MRI) available for my review was most recently performed in 2015. I would advise that [name] speak with Dr. Harrington Challenger about the benefits of updated neuroimaging and seek a referral. Given balance/gait concerns, an additional spinal cord MRI could also be beneficial to rule out any lesions which could contribute to balance issues. She could also talk with Dr. Harrington Challenger regarding a referral for outpatient vestibular therapy given ongoing and recurring bouts of vertigo. This and outpatient physical therapy could provide assistance with balance disturbances.    She reported being medication resistant surrounding common mood-related treatments. In response to this, she stated that she will be receiving TMS in the future to help address symptoms of depression. When discussing this, she reported that she has had some difficulty surrounding setting up these services due to prior medical records suggesting a history of bipolar disorder. It is important to highlight that [name] denied any experiences which could be conceptualized as a manic or hypomanic episode throughout her life. As evidence for mania/hypomania is required for a diagnosis of bipolar disorder to be accurate, I do not agree with her having this condition based upon current information. Diagnoses surrounding major depressive disorder and generalized anxiety  disorder superimposed on a history of ADHD appear far more applicable.   Regarding psychotherapy, [name] reported being engaged in services presently but does not view them as helpful. When describing her therapeutic sessions, she stated "I don't feel like I am getting any help" and that "I  talk and then that's it." Based upon this report, I have concerns that [name] is receiving purely supportive therapy, which would be inadequate for [name]'s needs. She would benefit from an active and collaborative therapeutic environment, rather than one purely supportive in nature. Recommended treatment modalities include Cognitive Behavioral Therapy (CBT) or Acceptance and Commitment Therapy (ACT). I would encourage her to discuss her experiences with her therapist and see if there is a way to re-design treatment moving forward. If not, I would encourage her to find a new provider.   Untreated obstructive sleep apnea will worsen cognitive deficits and her overall functioning. It will also increase her risk for heart attack, stroke, and dementia. She is strongly encouraged to meet with her sleep specialist to discuss new ways of treating this condition.   [name] is encouraged to attend to lifestyle factors for brain health (e.g., regular physical exercise, good nutrition habits, regular participation in cognitively-stimulating activities, and general stress management techniques), which are likely to have benefits for both emotional adjustment and cognition. In fact, in addition to promoting good general health, regular exercise incorporating aerobic activities (e.g., brisk walking, jogging, cycling, etc.) has been demonstrated to be a very effective treatment for depression and stress, with similar efficacy rates to both antidepressant medication and psychotherapy. Optimal control of vascular risk factors (including safe cardiovascular exercise and adherence to dietary recommendations) is  encouraged.   When learning new information, she would benefit from information being broken up into small, manageable pieces. She may also find it helpful to articulate the material in her own words and in a context to promote encoding at the onset of a new task. This material may need to be repeated multiple times to promote encoding.  Memory can be improved using internal strategies such as rehearsal, repetition, chunking, mnemonics, association, and imagery. External strategies such as written notes in a consistently used memory journal, visual and nonverbal auditory cues such as a calendar on the refrigerator or appointments with alarm, such as on a cell phone, can also help maximize recall.    To address problems with fluctuating attention, she may wish to consider:   -Avoiding external distractions when needing to concentrate   -Limiting exposure to fast paced environments with multiple sensory demands   -Writing down complicated information and using checklists   -Attempting and completing one task at a time (i.e., no multi-tasking)   -Verbalizing aloud each step of a task to maintain focus   -Reducing the amount of information considered at one time  Review of Records:   Ms. Danser was seen by [facility] Margette Fast, M.D.) on 01/26/2017 for an evaluation of several symptoms. At that time, she reported a one year history of episodic vertigo which occurs when she lays down and then turns her head to the right and slightly upwards. She noted being able to get over this sensation within 1 or 2 minutes if she puts her head into a neutral position and stays still. She also reported mild gait instability when walking and will stumble on occasion. She denied other symptoms with experienced dizziness, including headache, hearing changes, or ear pain. Double vision and slurred speech were also denied. She reported issues with memory where she has had difficulty misplacing  things around her residence. There is a history of obstructive sleep apnea. At that time, she had not been using her CPAP machine due to the machine itself being changed. She was referred for a brain MRI and  advised to follow-up. However, records suggest numerous no show appointments with Dr. Jannifer Franklin in 2019, 2021, and early 2022. As such, she appeared to have been lost to follow-up.   Most recently, she met with her PCP Lona Kettle, M.D.) on 04/23/2020. At that time, symptoms of depression were noted. Ms. Bertram reported that she was pursuing transcranial magnetic stimulation ([place]) treatment in the near future. Dr. Harrington Challenger questioned her previous diagnosis of bipolar disorder based upon Ms. Pfister's denial of prior manic symptoms. There is mention of brain fog and subjective short-term memory dysfunction. However, no other details were provided regarding cognitive concerns. Ultimately, Ms. Hefel was referred for a comprehensive neuropsychological evaluation to characterize her cognitive abilities and to assist with diagnostic clarity and treatment planning.  Head CTs on 07/23/2012 and 04/20/2013 were negative. Brain MRI on 04/21/2013 revealed small white matter intensities in the frontal lobes bilaterally, said to be stable relative to a prior MRI in 2011. No acute abnormalities were found. No more recent neuroimaging was available for review.   Past Medical History:  Diagnosis Date  . Abscess of Bartholin's gland   . 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 Acute vestibular neuronitis   . ADHD (attention deficit hyperactivity disorder)   . Adverse effect of general anesthetic    felt paralyzed while receiving anesthesia  . Bursitis of right shoulder 05/24/2019   Right shoulder subacromial injection  . CAD (coronary artery disease)    cath 05/2016 showing 50-70% stenosis in the mid LAD proximal to the first diagonal and 70-80% small OM1. FFR of LAD  not  performed because of difficulty with catheter control from the right radial.   . Chronic cough 02/22/2020  . Chronic kidney disease    hx of kidney cancer  . Diastolic dysfunction, left ventricle 05/31/2013  . Difficult intubation    told by [facility] that she was hard to intubate 15 yrs ago in [place]- surgery since then no problems  . Dyspnea   . Essential hypertension 05/31/2013  . Generalized anxiety disorder    Adequate for discharge   . GERD (gastroesophageal reflux disease)   . History of attention deficit disorder   . History of echocardiogram 07/2012   Normal LVF w grade I siastolic dysfunction   . History of endometriosis   . History of gall stones 12/31/2009  . History of pleural effusion   . Hyperlipidemia 06/24/2016   LDL goal <70  . Hypertension   . 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 Major depressive disorder   . Migraine headaches   . Obesity   . Obstructive sleep apnea 02/20/2007   NPSG 2004:  AHI 10/hr Failed cpap trials.>dental appliance   Split night 06/2015 >Moderate obstructive sleep apnea occurred during this study  (AHI = 22.7/h).>rec CPAP >> poor compliance  HST 11/2016 AHI 12/h, 7h TST  03/2018 -office visit with Dr. Elsworth Soho discussed inspire device, patient would need to lose weight  . PAT (paroxysmal atrial tachycardia)    s/p ablation  . PONV (postoperative nausea and vomiting)   . PTSD (post-traumatic stress disorder)   . Pure hypercholesterolemia 02/18/2019  . PVC's (premature ventricular contractions)   . Renal mass 02/15/2012  . Restless legs syndrome (RLS) 02/20/2007  . Vertigo     Past Surgical History:  Procedure Laterality Date  . CARDIAC ELECTROPHYSIOLOGY [place] AND ABLATION  2000s  . LAPAROSCOPIC NEPHRECTOMY, HAND ASSISTED Right 07/31/2019   Procedure: HAND ASSISTED  LAPAROSCOPIC NEPHRECTOMY CONVERTED TO OPEN WITH REPAIR OF INFERIOR VENA CAVAOTOMY;  Surgeon: Robley Fries, MD;  Location: [facility];  Service: Urology;   Laterality: Right;  3 HRS  . laparoscopy    . LEFT HEART CATH AND CORONARY ANGIOGRAPHY N/A 06/08/2016   Procedure: Left Heart Cath and Coronary Angiography;  Surgeon: Belva Crome, MD;  Location: [place] [facility];  Service: Cardiovascular;  Laterality: N/A;  . nasoseptal reconstruction  1990s  . NEPHRECTOMY RECIPIENT Right 08/2019   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  . THYROIDECTOMY  1990  . TONSILLECTOMY     as a child  . TUBAL LIGATION  1970s  . uterine mass removal  03/2012   was found to be benign    Current Outpatient Medications:  .  b complex vitamins tablet, Take 1 tablet by mouth daily., Disp: , Rfl:  .  diphenhydrAMINE (BENADRYL) 25 mg capsule, Take 25 mg by mouth every 6 (six) hours as needed for allergies., Disp: , Rfl:  .  DULoxetine (CYMBALTA) 60 MG capsule, Take 60 mg by mouth daily., Disp: , Rfl:  .  fluticasone (FLONASE) 50 MCG/ACT nasal spray, Place 2 sprays into both nostrils as needed. , Disp: , Rfl:  .  levothyroxine (SYNTHROID) 88 MCG tablet, 1 tablet in the morning on an empty stomach, Disp: , Rfl:  .  LORazepam (ATIVAN) 0.5 MG tablet, TAKE 1 TABLET (0.5 MG TOTAL) BY MOUTH 3 (THREE) TIMES DAILY AS NEEDED FOR ANXIETY., Disp: 90 tablet, Rfl: 0 .  losartan-hydrochlorothiazide (HYZAAR) 50-12.5 MG tablet, Take 1 tablet by mouth 2 (two) times daily., Disp: 180 tablet, Rfl: 3 .  Menthol, Topical Analgesic, (BLUE GEL EX), Apply 1 application topically every 6 (six) hours as needed (restless legs). BLUE ICE PAIN 3% Menthol, Disp: , Rfl:  .  methylphenidate (RITALIN) 5 MG tablet, Take 1 tablet (5 mg total) by mouth 2 (two) times daily with breakfast and lunch., Disp: 60 tablet, Rfl: 0 .  Omega 3 1000 MG CAPS, Take 1,000 mg by mouth daily., Disp: , Rfl:  .  ondansetron (ZOFRAN ODT) 4 MG disintegrating tablet, Take 1 tablet (4 mg total) by mouth every 8 (eight) hours as needed., Disp: 20 tablet, Rfl: 0 .  polyethylene glycol powder (GLYCOLAX/MIRALAX) 17 GM/SCOOP powder, Take 1  Container by mouth as needed., Disp: , Rfl:  .  rOPINIRole (REQUIP) 0.5 MG tablet, TAKE 1 TABLET BY MOUTH DAILY AFTER SUPPER & TAKE 2 TABLETS BY MOUTH AT BEDTIME, Disp: 90 tablet, Rfl: 1 .  rosuvastatin (CRESTOR) 5 MG tablet, Take 1 tablet (5 mg total) by mouth daily., Disp: 90 tablet, Rfl: 3 .  traZODone (DESYREL) 100 MG tablet, Take 100 mg by mouth at bedtime., Disp: , Rfl:  .  Vitamin D, Cholecalciferol, 50 MCG (2000 UT) CAPS, Take 4,000 Units by mouth daily. , Disp: , Rfl:   Clinical Interview:   The following information was obtained during a clinical interview with Ms. Natzke and a close friend prior to cognitive testing.  Cognitive Symptoms: Decreased short-term memory: Endorsed. Specifically, she stated that names of even familiar individuals will take a longer time to come to her and that sometimes they do not come at all. She also acknowledged trouble misplacing/losing things, while her friend reported mild difficulties recalling details of previous conversations. Ms. Bickley noted that memory concerns had been present since meeting with Dr. Harrington Challenger in February 2022. However, there are records dating back to at least 2018 which detail similar concerns/experiences.  Decreased long-term memory: Denied. Decreased attention/concentration: Endorsed. She reported being diagnosed with ADHD as an adult. She reported longstanding symptoms of trouble with sustained attention, increased distractibility, disorganization, and impulsivity, dating back to early childhood and academic settings. She currently utilizes stimulant medication which has been helpful in improving these symptoms.  Reduced processing speed: Denied. In fact, she described the opposite sensation where my thought processes appear sped up and racing at times.  Difficulties with executive functions: Endorsed. In addition to trouble with organization and impulsivity described above, she also reported some trouble with indecisiveness.  Overt personality changes were largely denied. Her friend did state that Ms. Somero will occasionally not fully listen to the desires of others which has created conflict in the past. However, this was said to not represent an acute change.  Difficulties with emotion regulation: Denied. Difficulties with receptive language: Denied. Difficulties with word finding: Endorsed. Decreased visuoperceptual ability: Denied.  Difficulties completing ADLs: Denied.  Additional Medical History: History of traumatic brain injury/concussion: Denied. History of stroke: Denied. History of seizure activity: Denied. History of known exposure to toxins: Denied. Symptoms of chronic pain: Endorsed. She reported being diagnosed with fibromyalgia in the past. She described pain symptoms as widespread and present on a daily basis. However, she and her medical team are actively "working on it" at the present time.  Experience of frequent headaches/migraines: Endorsed. She reported experiencing generally mild right frontal pain, sometimes described as "skull pain" on a near daily basis. Symptoms were not always to the extent that OTC medication is utilized.  Frequent instances of dizziness/vertigo: Endorsed. She has reportedly dealt with brief bouts of vertigo during at least the past few years. These experiences seem to come and go, with the current time being a period of symptom exacerbation. The cause for this was largely unknown.   Sensory changes: She has cataracts and is awaiting upcoming surgery to remove them. Despite this, she is able to read fine as long as the print of not exceedingly small. She reported mild hearing loss but not to the extent that hearing aids are required. She noted more aversion to strong tastes/smells relative to prior experiences.  Balance/coordination difficulties: Endorsed. This represented a particularly salient concern of hers. She reported ongoing balance instability where her  legs/feet no longer feel fully coordinated and she is prone to stumbling and bumping into things in her environment more often. One side was not said to be worse than the other and she denied any recent falls. However, she did express concern about the latter and noted being very careful when ambulating, often utilizing others for added stability while going up and down steps.  Other motor difficulties: Generally denied. Some tremulous experiences were noted in her arms/hands. However, these were generally attributed to past medication side effects and never represented a consistent experience.  Sleep History: Estimated hours obtained each night: Unknown. However, she described her sleep as "pretty poor" overall.  Difficulties falling asleep: Denied. Difficulties staying asleep: Endorsed. She reported frequently waking throughout the night due to the need to re-position herself for added comfort.  Feels rested and refreshed upon awakening: Denied.  History of snoring: Endorsed. History of waking up gasping for air: Endorsed. Witnessed breath cessation while asleep: Endorsed. She acknowledged a history of obstructive sleep apnea. She is not using a CPAP machine currently. Reportedly, she has trialed several CPAP machines in the past and despite regular use, she never was able to notice significant benefits (i.e., never awoke feeling rested or  refreshed the following morning). She has also trialed dental appliances in the past.   History of vivid dreaming: Denied. Excessive movement while asleep: Denied. Instances of acting out her dreams: Denied.  Psychiatric/Behavioral Health History: Depression: She reported a longstanding history of depressive symptoms, including some prior periods where symptoms were quite severe. She is currently attempting to find a new psychiatrist. She is involved in outpatient psychotherapy. However, lately, this was described as unhelpful, with her noting that she "talk[s]  and that's it." She described her current mood as depressed and noted significant symptoms of amotivation. Current or remote suicidal ideation, intent, or plan was denied.  Anxiety: She also reported a longstanding history of generalized anxiety symptoms dating back to early childhood.  Mania: Denied. Medical records suggest a prior history of bipolar I disorder. However, Ms. Rode challenged this diagnosis and denied any prior experiences which could be constituted as even a mild hypomanic episode. She theorized that a prior individual misclassified symptoms of ADHD as her having bipolar disorder and that this has been perpetuated throughout her medical record incorrectly.  Trauma History: Medical records suggest a prior history of PTSD. However, no specific traumatic experiences were described during interview.  Visual/auditory hallucinations: Denied. Delusional thoughts: Denied.  Tobacco: Denied. Alcohol: She reported very rare alcohol consumption and denied a history of problematic alcohol abuse or dependence.  Recreational drugs: Denied. Caffeine: Denied.   Family History: Problem Relation Age of Onset  . Allergies Father   . Skin cancer Father   . Alcohol abuse Father   . Mental illness Father   . Heart disease Mother   . Depression Mother   . Hypertension Mother   . CAD Mother   . Colon cancer Paternal Grandmother   . OCD Paternal Grandmother   . Multiple sclerosis Sister   . Autoimmune disease Brother        unknown type  . Allergies Daughter   . Heart disease Maternal Grandfather   . Cancer Paternal Grandfather   . Parkinson's disease Paternal Grandfather   . Mental illness Maternal Grandmother    This information was confirmed by Ms. Benn Moulder.  Academic/Vocational History: Highest level of educational attainment: 14 years. She graduated from high school and completed an additional two years of college. She described herself as an average (B-) student in college but  performed better while in high school. [place] was noted as a relative weakness in prior educational settings.  History of developmental delay: Denied. History of grade repetition: Denied. Enrollment in special education courses: Denied. History of LD/ADHD: Endorsed. As described above, she was diagnosed with ADHD as an adult.   Employment: Retired. 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   Evaluation Results:   Behavioral Observations: Ms. Badia was accompanied by a close friend, arrived to her appointment on time, and was appropriately dressed and groomed. She appeared alert and oriented. Observed gait and station were within normal limits. Gross motor functioning appeared intact upon informal observation and no abnormal movements (e.g., tremors) were noted. Her affect was generally relaxed and positive, but did range appropriately given the subject being discussed during interview. Spontaneous speech was fluent and word finding difficulties were not observed during interview. Thought processes were coherent, organized, and normal in content. Insight into her cognitive difficulties appeared adequate.   During testing, the psychometrist noticed mild resting tremors on the left side. Sustained attention was appropriate. Task engagement was adequate and she  persisted when challenged. Overall, Ms. Meditz was cooperative with the clinical interview and subsequent testing procedures.   Adequacy of Effort: The validity of neuropsychological testing is limited by the extent to which the individual being tested may be assumed to have exerted adequate effort during testing. Ms. Bellomo expressed her intention to perform to the best of her abilities and exhibited adequate task engagement and persistence. Scores across stand-alone and embedded performance validity measures were variable but generally within expectation. One  below expectation performance was due to difficulty discriminating between two separate lists of words on a related memory task rather than poor engagement or attempts to perform poorly. As such, the results of the current evaluation are believed to be a valid representation of Ms. Vinje's current cognitive functioning.  Test Results: Ms. Dumas was largely oriented at the time of the current evaluation. Points were lost for her being unable to estimate the current time.  Intellectual abilities based upon educational and vocational attainment were estimated to be in the average range. Premorbid abilities were estimated to be within the well above average range based upon a single-word reading test.   Processing speed was average to above average. Basic attention was average. More complex attention (e.g., working memory) was above average. Executive functioning was well below average to below average across an unstructured computerized task assessing hypothesis testing and adaptability. However, performance across all other related tasks were average to well above average. Performance was also exceptionally high across a task assessing safety and judgment.  Assessed receptive language abilities were above average. Likewise, Ms. Petrak did not exhibit any difficulties comprehending task instructions and answered all questions asked of her appropriately. Assessed expressive language was mildly variable. Phonemic fluency was well above average, semantic (animal) fluency was exceptionally low, and confrontation naming was average.     Assessed visuospatial/visuoconstructional abilities were average.    Learning (i.e., encoding) of novel verbal and visual information was variable, ranging from the well below average to average normative ranges. Spontaneous delayed recall (i.e., retrieval) of previously learned information was below average to average. Retention rates were 93% across a story learning  task, 200% across a list learning task, and 100% across a shape learning task. Performance across recognition tasks was well below average across a list learning task due to difficulties differentiating between two separate word lists. Performance across other recognition tasks was average to above average, suggesting evidence for information consolidation.   Results of emotional screening instruments suggested that recent symptoms of generalized anxiety were in the severe range, while symptoms of depression were also within the severe range. A screening instrument assessing recent sleep quality suggested the presence of moderate sleep dysfunction.  Tables of Scores:   Note: This summary of test scores accompanies the interpretive report and should not be considered in isolation without reference to the appropriate sections in the text. Descriptors are based on appropriate normative data and may be adjusted based on clinical judgment. The terms "impaired" and "within normal limits (WNL)" are used when a more specific level of functioning cannot be determined.       Validity Testing:   DESCRIPTOR       Dot Counting Test: --- --- Within Expectation  NAB EVI: --- --- Below Expectation  D-KEFS Color Word Effort Index: --- --- Within Expectation       Orientation:      Raw Score Percentile   NAB Orientation, Form 1 27/29 --- ---       Cognitive Screening:  Raw Score Percentile   SLUMS: 22/30 --- ---       Intellectual Functioning:           Standard Score Percentile   Test of Premorbid Functioning: 125 95 Well Above Average       Memory:          NAB Memory Module, Form 1: T Score Percentile   List Learning       Total Trials 1-3 15/36 (36) 8 Well Below Average    List B 4/12 (50) 50 Average    Short Delay Free Recall 2/12 (28) 2 Exceptionally Low    Long Delay Free Recall 4/12 (38) 12 Below Average    Retention Percentage 200 (77) >99 Exceptionally High    Recognition  Discriminability 1 (36) 8 Well Below Average  Shape Learning       Total Trials 1-3 15/27 (52) 58 Average    Delayed Recall 6/9 (56) 73 Average    Retention Percentage 100 (49) 46 Average    Recognition Discriminability 8 (59) 82 Above Average  Story Learning       Immediate Recall 54/80 (42) 21 Below Average    Delayed Recall 28/40 (42) 21 Below Average    Retention Percentage 93 (53) 62 Average  Daily Living Memory       Immediate Recall 38/51 (44) 27 Average    Delayed Recall 13/17 (47) 38 Average    Retention Percentage 81 (47) 38 Average    Recognition Hits 9/10 (55) 69 Average       Attention/Executive Function:          Trail Making Test (TMT): Raw Score (T Score) Percentile     Part A 28 secs.,  1 error (59) 82 Above Average    Part B 51 secs.,  1 error (67) 96 Well Above Average         Scaled Score Percentile   WAIS-IV Coding: 11 63 Average       NAB Attention Module, Form 1: T Score Percentile     Digits Forward 54 66 Average    Digits Backwards 59 82 Above Average       D-KEFS Color-Word Interference Test: Raw Score (Scaled Score) Percentile     Color Naming 27 secs. (13) 84 Above Average    Word Reading 17 secs. (14) 91 Above Average    Inhibition 79 secs. (9) 37 Average      Total Errors 2 errors (11) 63 Average    Inhibition/Switching 61 secs. (12) 75 Above Average      Total Errors 1 error (12) 75 Above Average       [employer] Test: Raw Score Percentile     Categories (trials) 1 (64) 11-16 Below Average    Total Errors 35 9 Below Average    Perseverative Errors 30 4 Well Below Average    Non-Perseverative Errors 5 86 Above Average    Failure to Maintain Set 1 --- ---       NAB Executive Functions Module, Form 1: T Score Percentile     Judgment 70 98 Exceptionally High       Language:          Verbal Fluency Test: Raw Score (T Score) Percentile     Phonemic Fluency (FAS) 56 (64) 92 Well Above Average    Animal Fluency 10 (28) 2  Exceptionally Low        NAB Language Module, Form 1: T Score Percentile  Auditory Comprehension 58 79 Above Average    Naming 29/31 (49) 46 Average       Visuospatial/Visuoconstruction:      Raw Score Percentile   Clock Drawing: 8/10 --- Within Normal Limits       NAB Spatial Module, Form 1: T Score Percentile     Figure Drawing Copy 46 34 Average        Scaled Score Percentile   WAIS-IV Block Design: 10 50 Average       Mood and Personality:      Raw Score Percentile   Geriatric Depression Scale: 23 --- Severe  Geriatric Anxiety Scale: 28 --- Severe    Somatic 11 --- Moderate    Cognitive 7 --- Moderate    Affective 10 --- Severe       Additional Questionnaires:      Raw Score Percentile   PROMIS Sleep Disturbance Questionnaire: 30 --- Moderate   Informed Consent and Coding/Compliance:   The current evaluation represents a clinical evaluation for the purposes previously outlined by the referral source and is in no way reflective of a forensic evaluation.   Ms. Kromer was provided with a verbal description of the nature and purpose of the present neuropsychological evaluation. Also reviewed were the foreseeable risks and/or discomforts and benefits of the procedure, limits of confidentiality, and mandatory reporting requirements of this provider. The patient was given the opportunity to ask questions and receive answers about the evaluation. Oral consent to participate was provided by the patient.   This evaluation was conducted by Christia Reading, Ph.D., licensed clinical neuropsychologist. Ms. Seals completed a clinical interview with Dr. Melvyn Novas, billed as one unit [phone], and 130 minutes of cognitive testing and scoring, billed as one unit [phone] and three additional units [id number]. Psychometrist Milana Kidney, B.S., assisted Dr. Melvyn Novas with test administration and scoring procedures. As a separate and discrete service, Dr. Melvyn Novas spent a total of 160 minutes in interpretation  and report writing billed as one unit [phone] and two units [id number].

## 2020-08-22 NOTE — Progress Notes (Signed)
   Psychometrician Note   Cognitive testing was administered to [name] by Milana Kidney, B.S. (psychometrist) under the supervision of Dr. Christia Reading, Ph.D., licensed psychologist on 08/22/20. [name] did not appear overtly distressed by the testing session per behavioral observation or responses across self-report questionnaires. Rest breaks were offered.    The battery of tests administered was selected by Dr. Christia Reading, Ph.D. with consideration to [name] current level of functioning, the nature of her symptoms, emotional and behavioral responses during interview, level of literacy, observed level of motivation/effort, and the nature of the referral question. This battery was communicated to the psychometrist. Communication between Dr. Christia Reading, Ph.D. and the psychometrist was ongoing throughout the evaluation and Dr. Christia Reading, Ph.D. was immediately accessible at all times. Dr. Christia Reading, Ph.D. provided supervision to the psychometrist on the date of this service to the extent necessary to assure the quality of all services provided.    [name] will return within approximately 1-2 weeks for an interactive feedback session with Dr. Melvyn Novas at which time her test performances, clinical impressions, and treatment recommendations will be reviewed in detail. [name] understands she can contact our office should she require our assistance before this time.  A total of 130 minutes of billable time were spent face-to-face with [name] by the psychometrist. This includes both test administration and scoring time. Billing for these services is reflected in the clinical report generated by Dr. Christia Reading, Ph.D.  This note reflects time spent with the psychometrician and does not include test scores or any clinical interpretations made by Dr. Melvyn Novas. The full report will follow in a separate note.

## 2020-08-25 ENCOUNTER — Ambulatory Visit: Payer: Medicare Other

## 2020-08-28 ENCOUNTER — Other Ambulatory Visit: Payer: Self-pay

## 2020-08-28 ENCOUNTER — Ambulatory Visit (HOSPITAL_COMMUNITY)
Admission: EM | Admit: 2020-08-28 | Discharge: 2020-08-28 | Disposition: A | Discharge status: Home / Self Care | Payer: Medicare Other | Attending: Psychiatry | Admitting: Psychiatry

## 2020-08-28 ENCOUNTER — Other Ambulatory Visit: Payer: Self-pay | Admitting: Pulmonary Disease

## 2020-08-28 ENCOUNTER — Ambulatory Visit: Payer: Medicare Other

## 2020-08-28 DIAGNOSIS — G2581 Restless legs syndrome: Secondary | ICD-10-CM

## 2020-08-28 DIAGNOSIS — H269 Unspecified cataract: Secondary | ICD-10-CM | POA: Diagnosis not present

## 2020-08-28 DIAGNOSIS — F411 Generalized anxiety disorder: Secondary | ICD-10-CM | POA: Insufficient documentation

## 2020-08-28 DIAGNOSIS — G8929 Other chronic pain: Secondary | ICD-10-CM | POA: Diagnosis not present

## 2020-08-28 DIAGNOSIS — H538 Other visual disturbances: Secondary | ICD-10-CM | POA: Insufficient documentation

## 2020-08-28 DIAGNOSIS — R45851 Suicidal ideations: Secondary | ICD-10-CM | POA: Insufficient documentation

## 2020-08-28 DIAGNOSIS — M797 Fibromyalgia: Secondary | ICD-10-CM | POA: Diagnosis not present

## 2020-08-28 DIAGNOSIS — F332 Major depressive disorder, recurrent severe without psychotic features: Secondary | ICD-10-CM | POA: Diagnosis not present

## 2020-08-28 DIAGNOSIS — N1831 Chronic kidney disease, stage 3a: Secondary | ICD-10-CM | POA: Diagnosis not present

## 2020-08-28 NOTE — Discharge Summary (Signed)
[name] to be D/C'd Home per MD order. Discussed with the patient and all questions fully answered. An After Visit Summary was printed and given to the patient. Patient escorted out and D/C home via private auto.  Clois Dupes  08/28/2020 5:05 PM

## 2020-08-28 NOTE — ED Provider Notes (Signed)
[facility] Medical Screening Exam  Patient Name: [name] MRN: [id number] Date of Evaluation: 08/28/20 Chief Complaint:   Diagnosis:  Final diagnoses:  Severe episode of recurrent major depressive disorder, without psychotic features ([place])  Generalized anxiety disorder    History of Present illness: [name] is a 75 y.o. female with a history of anxiety, depression, fibromyalgia who presents to the [facility] accompanied by her ex fiance for assessment of depression and feeling overwhelmned. Patient is interviewed in assessment room alone. She is appropriately tearful throughout assessment while discussing her stressors. She states that today has been particularly difficult due to having 2 doctors appointments and having to get blood work done. She states that she multiple illnesses that have been impacting her health negatively and lists several. She reports having cataracts which cause her to have blurry vision and she has an upcoming surgery for this; she reports being in chronic pain which is currently being worked up although expresses frustration that no physician can identify a cause for her pain; she  reports having an upcoming surgery for a cholecystectomy. She states that she has treatment resistent depression and has been on multiple medications in the past which have not been successful in treating her depression and states that she has had genetic testing done as well that reflect this. She states that she went to see a psychiatrist to be assessed for possible [place] but was informed that her insurance would not cover it. She states that she went to have some testing done recently at the neurologist's office and that she is concerned that she may have MS as she feels that her symptoms are c/w this diagnosis. She states that in additional to multiple medical illnesses that her living situation is very stressful as she is living with her ex-fiance who has memory  issues; she describes having to be his caretaker. She states that she is on the waiting list for a voucher at an apartment called "[facility]" and is also on the waiting list for 2 other places; she is unsure when she will be off the waiting list.   She denies substance use. She reports having 1 daughter and 2 grandchildren but describes not having any contact with her grandson and daughter and having very minimal contact with her granddaughter. She has a therapist that she meets with at variable intervals, anywhere from every week to every 3 weeks; she states that she has been seeing her for 1.5 years but feels that she may need to changer her therapists as recently the sessions feel less helpful.   She describes having passive SI but denies active SI, plan or intent. She denies HI/AVH. She states that she has done the PHP through [facility] before and states that she has been in contact with one of the coordinators about it and says that she plans on calling her on Monday to follow up. She also requests resources for outpatient psychiatrists as well as housing resources. She states that she was previously on rittalin for ADHD and states that her current providers will not provide this prescription and would like to establish care with a psychiatrist to resume this medication.    Psychiatric Specialty Exam  Presentation  General Appearance:Appropriate for Environment; Casual  Eye Contact:Fair  Speech:Clear and Coherent; Normal Rate  Speech Volume:Normal  Handedness:No data recorded  Mood and Affect  Mood:Anxious; Depressed  Affect:Appropriate; Tearful   Thought Process  Thought Processes:Coherent; Goal Directed; Linear  Descriptions  of Associations:Intact  Orientation:Full (Time, Place and Person)  Thought Content:WDL    Hallucinations:None  Ideas of Reference:None  Suicidal Thoughts:Yes, Passive Without Intent; Without Plan  Homicidal Thoughts:No   Sensorium   Memory:Immediate Good; Recent Good; Remote Good  Judgment:Good  Insight:Good   Executive Functions  Concentration:Good  Attention Span:Good  [place]  Language:Good   Psychomotor Activity  Psychomotor Activity:Normal   Assets  Assets:Communication Skills; Desire for Improvement; Financial Resources/Insurance; Social Support   Sleep  Sleep:Fair  Number of hours:  No data recorded  No data recorded  Physical Exam: Physical Exam Constitutional:      Appearance: Normal appearance. She is obese.  HENT:     Head: Normocephalic and atraumatic.  Pulmonary:     Effort: Pulmonary effort is normal.  Neurological:     Mental Status: She is alert and oriented to person, place, and time.   Review of Systems  Constitutional:  Negative for chills and fever.  HENT:  Negative for hearing loss.   Eyes:  Positive for blurred vision.  Respiratory:  Negative for cough.   Musculoskeletal:  Positive for back pain and myalgias.  Neurological:  Positive for tingling. Negative for speech change.  Psychiatric/Behavioral:  Positive for depression. Negative for hallucinations.   Blood pressure 123/77, pulse 95, resp. rate 16, SpO2 99 %. There is no height or weight on file to calculate BMI.  Musculoskeletal: Strength & Muscle Tone: within normal limits Gait & Station: normal Patient leans: N/A   [place] [facility] Discharge Disposition for Follow up and Recommendations: Based on my evaluation the patient does not appear to have an emergency medical condition and can be discharged with resources and follow up care in outpatient services for Medication Management and Partial Hospitalization Program   Ival Bible, MD 08/28/2020, 4:37 PM

## 2020-08-28 NOTE — Discharge Instructions (Signed)
[facility])  [address], [place], [place] [address] Ph: [phone] Hours:  Saturday Closed Sunday Closed Monday 8AM-3PM Tuesday 8AM-3PM Wednesday 8AM-3PM Thursday 8AM-3PM Friday             8AM-3PM  -The [facility] may have some additional resources regarding housing

## 2020-08-28 NOTE — BH Assessment (Signed)
Patient reports to [facility] feeling overwhelmed due to multiple comorbidity , pain and living arrangements. Patient denies SI/ HI / AVH or any substance use. Patient reports having cataracts, blurry vision , vaginal bleeding , gall bladder, torn rotator cuff, spinal pain , kidney cancer , fibromyalgia , neuropathy ,and foreign body in throat . Patient has multiple surgeries  coming up .Patient is routine

## 2020-08-29 ENCOUNTER — Ambulatory Visit (INDEPENDENT_AMBULATORY_CARE_PROVIDER_SITE_OTHER): Payer: Medicare Other | Admitting: Psychology

## 2020-08-29 DIAGNOSIS — F411 Generalized anxiety disorder: Secondary | ICD-10-CM | POA: Diagnosis not present

## 2020-08-29 DIAGNOSIS — R41840 Attention and concentration deficit: Secondary | ICD-10-CM

## 2020-08-29 DIAGNOSIS — F332 Major depressive disorder, recurrent severe without psychotic features: Secondary | ICD-10-CM

## 2020-08-29 NOTE — Progress Notes (Signed)
   Neuropsychology Feedback Session [name]. [place] Department of Neurology  Reason for Referral:   [name] is a 75 y.o. right-handed Caucasian female referred by Lona Kettle, M.D., to characterize her current cognitive functioning and assist with diagnostic clarity and treatment planning in the context of experienced brain fog and reported short-term memory dysfunction.  Feedback:   [name] completed a comprehensive neuropsychological evaluation on 08/22/2020. Please refer to that encounter for the full report and recommendations. Briefly, results suggested a few isolated impairments across specific tasks but no consistent impairment across broad cognitive domains. Isolated impairments were seen across a computerized task assessing hypothesis testing and adaptability, an animal fluency task, and a list learning verbal memory task. However, performances across all other tasks assessing executive functioning, expressive language, and verbal/visual memory were appropriate respectively. Performance was also appropriate across processing speed, attention/concentration, receptive language, and visuospatial abilities. Across mood-related questionnaires, she reported acute levels of severe anxiety and severe depression. She also reported moderate sleep dysfunction. Overall, the etiology of isolated weaknesses and subjective dysfunction in her day-to-day life is believed to be multifactorial in nature. At its base level, given her history of ADHD, there is believed to be ongoing attentional dysregulation. This dysregulation would be worsened by severe psychiatric distress, symptoms of diffuse chronic pain, sleep dysfunction due to untreated obstructive sleep apnea, and numerous other chronic medical conditions.  [name] was unaccompanied during the current feedback session. Content of the current session focused on the results of her neuropsychological evaluation. Ms.  Coppa was given the opportunity to ask questions and her questions were answered. She was encouraged to reach out should additional questions arise. A copy of her report was provided at the conclusion of the visit.      25 minutes were spent conducting the current feedback session with [name], billed as one unit [phone].

## 2020-08-30 ENCOUNTER — Ambulatory Visit: Payer: Medicare Other

## 2020-09-02 DIAGNOSIS — M545 Low back pain, unspecified: Secondary | ICD-10-CM | POA: Diagnosis not present

## 2020-09-02 DIAGNOSIS — M7062 Trochanteric bursitis, left hip: Secondary | ICD-10-CM | POA: Diagnosis not present

## 2020-09-02 NOTE — Telephone Encounter (Signed)
Dr. Alva, please advise on refill request. ?

## 2020-09-02 NOTE — Telephone Encounter (Signed)
Ok to refill 

## 2020-09-03 ENCOUNTER — Other Ambulatory Visit: Payer: Self-pay

## 2020-09-04 ENCOUNTER — Emergency Department (HOSPITAL_COMMUNITY): Payer: Medicare Other

## 2020-09-04 ENCOUNTER — Other Ambulatory Visit: Payer: Self-pay

## 2020-09-04 ENCOUNTER — Encounter (HOSPITAL_COMMUNITY): Payer: Self-pay

## 2020-09-04 ENCOUNTER — Encounter: Payer: Medicare Other | Admitting: Counselor

## 2020-09-04 ENCOUNTER — Emergency Department (HOSPITAL_COMMUNITY)
Admission: EM | Admit: 2020-09-04 | Discharge: 2020-09-05 | Disposition: A | Discharge status: Home / Self Care | Payer: Medicare Other | Attending: Emergency Medicine | Admitting: Emergency Medicine

## 2020-09-04 DIAGNOSIS — F331 Major depressive disorder, recurrent, moderate: Secondary | ICD-10-CM | POA: Diagnosis not present

## 2020-09-04 DIAGNOSIS — R45851 Suicidal ideations: Secondary | ICD-10-CM | POA: Diagnosis not present

## 2020-09-04 DIAGNOSIS — I129 Hypertensive chronic kidney disease with stage 1 through stage 4 chronic kidney disease, or unspecified chronic kidney disease: Secondary | ICD-10-CM | POA: Diagnosis not present

## 2020-09-04 DIAGNOSIS — Z85850 Personal history of malignant neoplasm of thyroid: Secondary | ICD-10-CM | POA: Diagnosis not present

## 2020-09-04 DIAGNOSIS — R002 Palpitations: Secondary | ICD-10-CM | POA: Insufficient documentation

## 2020-09-04 DIAGNOSIS — I2510 Atherosclerotic heart disease of native coronary artery without angina pectoris: Secondary | ICD-10-CM | POA: Diagnosis not present

## 2020-09-04 DIAGNOSIS — R0602 Shortness of breath: Secondary | ICD-10-CM | POA: Diagnosis not present

## 2020-09-04 DIAGNOSIS — Z20822 Contact with and (suspected) exposure to covid-19: Secondary | ICD-10-CM | POA: Insufficient documentation

## 2020-09-04 DIAGNOSIS — N189 Chronic kidney disease, unspecified: Secondary | ICD-10-CM | POA: Diagnosis not present

## 2020-09-04 DIAGNOSIS — Z743 Need for continuous supervision: Secondary | ICD-10-CM | POA: Diagnosis not present

## 2020-09-04 DIAGNOSIS — Z79899 Other long term (current) drug therapy: Secondary | ICD-10-CM | POA: Diagnosis not present

## 2020-09-04 DIAGNOSIS — R1110 Vomiting, unspecified: Secondary | ICD-10-CM | POA: Diagnosis not present

## 2020-09-04 DIAGNOSIS — I4891 Unspecified atrial fibrillation: Secondary | ICD-10-CM | POA: Diagnosis not present

## 2020-09-04 DIAGNOSIS — I499 Cardiac arrhythmia, unspecified: Secondary | ICD-10-CM | POA: Diagnosis not present

## 2020-09-04 DIAGNOSIS — R079 Chest pain, unspecified: Secondary | ICD-10-CM | POA: Insufficient documentation

## 2020-09-04 DIAGNOSIS — R0789 Other chest pain: Secondary | ICD-10-CM | POA: Diagnosis not present

## 2020-09-04 DIAGNOSIS — E039 Hypothyroidism, unspecified: Secondary | ICD-10-CM | POA: Insufficient documentation

## 2020-09-04 LAB — COMPREHENSIVE METABOLIC PANEL
ALT: 15 U/L (ref 0–44)
AST: 17 U/L (ref 15–41)
Albumin: 4.1 g/dL (ref 3.5–5.0)
Alkaline Phosphatase: 63 U/L (ref 38–126)
Anion gap: 9 (ref 5–15)
BUN: 24 mg/dL — ABNORMAL HIGH (ref 8–23)
CO2: 28 mmol/L (ref 22–32)
Calcium: 9.9 mg/dL (ref 8.9–10.3)
Chloride: 103 mmol/L (ref 98–111)
Creatinine, Ser: 1.22 mg/dL — ABNORMAL HIGH (ref 0.44–1.00)
GFR, Estimated: 46 mL/min — ABNORMAL LOW (ref >60)
Glucose, Bld: 81 mg/dL (ref 70–99)
Potassium: 3.7 mmol/L (ref 3.5–5.1)
Sodium: 140 mmol/L (ref 135–145)
Total Bilirubin: 0.7 mg/dL (ref 0.3–1.2)
Total Protein: 6.9 g/dL (ref 6.5–8.1)

## 2020-09-04 LAB — CBC WITH DIFFERENTIAL/PLATELET
Abs Immature Granulocytes: 0.06 10*3/uL (ref 0.00–0.07)
Basophils Absolute: 0 10*3/uL (ref 0.0–0.1)
Basophils Relative: 0 %
Eosinophils Absolute: 0.1 10*3/uL (ref 0.0–0.5)
Eosinophils Relative: 1 %
HCT: 42.5 % (ref 36.0–46.0)
Hemoglobin: 13.7 g/dL (ref 12.0–15.0)
Immature Granulocytes: 1 %
Lymphocytes Relative: 24 %
Lymphs Abs: 1.9 10*3/uL (ref 0.7–4.0)
MCH: 28.1 pg (ref 26.0–34.0)
MCHC: 32.2 g/dL (ref 30.0–36.0)
MCV: 87.1 fL (ref 80.0–100.0)
Monocytes Absolute: 0.6 10*3/uL (ref 0.1–1.0)
Monocytes Relative: 7 %
Neutro Abs: 5.3 10*3/uL (ref 1.7–7.7)
Neutrophils Relative %: 67 %
Platelets: 274 10*3/uL (ref 150–400)
RBC: 4.88 MIL/uL (ref 3.87–5.11)
RDW: 13.6 % (ref 11.5–15.5)
WBC: 7.9 10*3/uL (ref 4.0–10.5)
nRBC: 0 % (ref 0.0–0.2)

## 2020-09-04 LAB — URINALYSIS, ROUTINE W REFLEX MICROSCOPIC
Bilirubin Urine: NEGATIVE
Glucose, UA: NEGATIVE mg/dL
Hgb urine dipstick: NEGATIVE
Ketones, ur: NEGATIVE mg/dL
Nitrite: NEGATIVE
Protein, ur: NEGATIVE mg/dL
Specific Gravity, Urine: 1.02 (ref 1.005–1.030)
pH: 7 (ref 5.0–8.0)

## 2020-09-04 LAB — TROPONIN I (HIGH SENSITIVITY)
Troponin I (High Sensitivity): 7 ng/L (ref <18)
Troponin I (High Sensitivity): 6 ng/L (ref <18)

## 2020-09-04 LAB — URINALYSIS, MICROSCOPIC (REFLEX)

## 2020-09-04 LAB — RESP PANEL BY RT-PCR (FLU A&B, COVID) ARPGX2
Influenza A by PCR: NEGATIVE
Influenza B by PCR: NEGATIVE
SARS Coronavirus 2 by RT PCR: NEGATIVE

## 2020-09-04 LAB — BRAIN NATRIURETIC PEPTIDE: B Natriuretic Peptide: 1009.1 pg/mL — ABNORMAL HIGH (ref 0.0–100.0)

## 2020-09-04 LAB — TSH: TSH: 1.485 u[IU]/mL (ref 0.350–4.500)

## 2020-09-04 LAB — PROTIME-INR
INR: 1 (ref 0.8–1.2)
Prothrombin Time: 13.1 seconds (ref 11.4–15.2)

## 2020-09-04 LAB — MAGNESIUM: Magnesium: 2.2 mg/dL (ref 1.7–2.4)

## 2020-09-04 NOTE — ED Triage Notes (Signed)
Pt came from home d/t feeling palpitations from approx. 12pm until EMS arrived on scene around 1500. Pt c/o chest pressure & "some" SOB (per EMS) while in route EMS reports some PVC's & pt has worn a halter top ordered by her Cardiologists for 3 days so far & it fell off early & she has not turned it it or received any results from it yet.

## 2020-09-04 NOTE — ED Provider Notes (Signed)
[facility] EMERGENCY DEPARTMENT Provider Note   CSN: [id number] Arrival date & time: 09/04/20  1632     History Chief Complaint  Patient presents with   Palpitations    [name] is a 75 y.o. female who presents with concern for palpitations which persisted throughout the weekend intermittently and then this morning starting at noon until arrival to the emergency department.  She additionally Dors is some chest pressure and intermittent shortness of breath.  States that she has history of palpitations and has worn Holter monitor in the past with her cardiologist recommendation, however states it fell off and she has never turned it in..  I personally reviewed this patient's medical records.  She has history of severe generalized anxiety disorder and depression, acquired hypothyroidism from history of total thyroidectomy for thyroid cancer, right nephrectomy for malignancy last year, coronary artery disease, and hypercholesterolemia.  Patient is not anticoagulated.  Patient extremely tearful at time of my initial exam, talked at length about many stress inducing factors in her life.  Of note her primary concern is persistent cough she has had for the last 5 months which is worse when she lies down, has caused her some hoarseness of voice and irritation in her throat.  She has history of reflux that is untreated.  Patient also endorsing SI without plan.   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 FFR of LAD  not performed because of difficulty with catheter control from the right radial.    Chronic cough 02/22/2020   Chronic kidney disease    hx of kidney cancer   Diastolic dysfunction, left ventricle 05/31/2013   Difficult intubation    told by [facility] that she was hard to intubate 15 yrs ago in [place]- surgery since then no problems   Dyspnea    Essential hypertension 05/31/2013   Generalized anxiety disorder    Adequate for discharge    GERD (gastroesophageal reflux disease)    History of attention deficit disorder    History of echocardiogram 07/2012   Normal LVF w grade I siastolic dysfunction    History of endometriosis    History of gall stones 12/31/2009   History of pleural effusion    Hyperlipidemia 06/24/2016   LDL goal <70   Hypertension    Hypothyroidism 1990   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 Elsworth Soho discussed inspire device, patient would need to lose weight   PAT (paroxysmal atrial tachycardia)    s/p ablation   PONV (postoperative nausea and vomiting)    PTSD (post-traumatic stress disorder)  Pure hypercholesterolemia 02/18/2019   PVC's (premature ventricular contractions)    Renal mass 02/15/2012   Restless legs syndrome (RLS) 02/20/2007   Vertigo     Patient Active Problem List   Diagnosis Date Noted   Chronic cough 02/22/2020   Bursitis of right shoulder 05/24/2019   Pure hypercholesterolemia 02/18/2019   PONV (postoperative nausea and vomiting)    Migraine headaches    History of pleural effusion    History of endometriosis    GERD (gastroesophageal reflux  disease)    Chronic kidney disease    CAD (coronary artery disease)    Acute vestibular neuronitis    Abscess of Bartholin's gland    ADHD (attention deficit hyperactivity disorder)    Vertigo 01/26/2017   Hyperlipidemia LDL goal <70 06/24/2016   PTSD (post-traumatic stress disorder)    Major depressive disorder    PVC's (premature ventricular contractions) 48/18/5631   Diastolic dysfunction, left ventricle 05/31/2013   PAT (paroxysmal atrial tachycardia) ([place]) 05/31/2013   Essential hypertension 05/31/2013   Obesity 05/31/2013   Generalized anxiety disorder    Renal mass 02/15/2012   History of gall stones 12/31/2009   Obstructive sleep apnea 02/20/2007   Restless legs syndrome (RLS) 02/20/2007   Acquired hypothyroidism 03/22/1988    Past Surgical History:  Procedure Laterality Date   CARDIAC ELECTROPHYSIOLOGY MAPPING AND ABLATION  2000s   LAPAROSCOPIC NEPHRECTOMY, HAND ASSISTED Right 07/31/2019   Procedure: HAND ASSISTED LAPAROSCOPIC NEPHRECTOMY CONVERTED TO OPEN WITH REPAIR OF INFERIOR VENA CAVAOTOMY;  Surgeon: Robley Fries, MD;  Location: [facility];  Service: Urology;  Laterality: Right;  3 HRS   laparoscopy     LEFT HEART CATH AND CORONARY ANGIOGRAPHY N/A 06/08/2016   Procedure: Left Heart Cath and Coronary Angiography;  Surgeon: Belva Crome, MD;  Location: [facility];  Service: Cardiovascular;  Laterality: N/A;   nasoseptal reconstruction  1990s   NEPHRECTOMY RECIPIENT Right 08/2019   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   [place] a [place]   TUBAL LIGATION  1970s   uterine mass removal  03/2012   was found to be benign     OB History   No obstetric history on file.     Family History  Problem Relation Age of Onset   Allergies Father    Skin cancer Father    Alcohol abuse Father    Mental illness Father    Heart disease Mother    Depression Mother    Hypertension Mother    CAD Mother    Colon cancer Paternal  Grandmother    OCD Paternal Grandmother    Multiple sclerosis Sister    Autoimmune disease Brother        unknown type   Allergies Daughter    Heart disease Maternal Grandfather    Cancer Paternal Grandfather    Parkinson's disease Paternal Grandfather    Mental illness Maternal Grandmother     Social History   Tobacco Use   Smoking status: Never   Smokeless tobacco: Never  Vaping Use   Vaping Use: Never used  Substance Use Topics   Alcohol use: Not Currently    Alcohol/week: 1.0 standard drink    Types: 1 Glasses of wine per week   Drug use: No    Home Medications Prior to Admission medications   Medication Sig Start Date End Date Taking? Authorizing Provider  b complex vitamins tablet Take 1 tablet by mouth daily.  [provider]  diphenhydrAMINE (BENADRYL) 25 mg capsule Take 25 mg by mouth every 6 (six) hours as needed for allergies.    [provider]  DULoxetine (CYMBALTA) 60 MG capsule Take 60 mg by mouth daily. 07/11/20   [provider]  fluticasone (FLONASE) 50 MCG/ACT nasal spray Place 2 sprays into both nostrils as needed.  03/02/19   [provider]  levothyroxine (SYNTHROID) 88 MCG tablet 1 tablet in the morning on an empty stomach    [provider]  LORazepam (ATIVAN) 0.5 MG tablet TAKE 1 TABLET (0.5 MG TOTAL) BY MOUTH 3 (THREE) TIMES DAILY AS NEEDED FOR ANXIETY. 12/10/19   [name], MD  losartan-hydrochlorothiazide [facility]) 50-12.5 MG tablet Take 1 tablet by mouth 2 (two) times daily. 08/04/20   Almyra Deforest, PA  Menthol, Topical Analgesic, (BLUE GEL EX) Apply 1 application topically every 6 (six) hours as needed (restless legs). BLUE ICE PAIN 3% Menthol    [provider]  methylphenidate (RITALIN) 5 MG tablet Take 1 tablet (5 mg total) by mouth 2 (two) times daily with breakfast and lunch. 09/27/19 02/22/20  [name], MD  Omega 3 1000 MG CAPS Take 1,000 mg by mouth daily.    [provider]   ondansetron (ZOFRAN ODT) 4 MG disintegrating tablet Take 1 tablet (4 mg total) by mouth every 8 (eight) hours as needed. 05/14/20   McDonald, Mia A, PA-C  polyethylene glycol powder (GLYCOLAX/MIRALAX) 17 GM/SCOOP powder Take 1 Container by mouth as needed.    [provider]  rOPINIRole (REQUIP) 0.5 MG tablet TAKE 1 TABLET BY MOUTH DAILY AFTER SUPPER & TAKE 2 TABLETS BY MOUTH AT BEDTIME 09/02/20   Rigoberto Noel, MD  rosuvastatin (CRESTOR) 5 MG tablet Take 1 tablet (5 mg total) by mouth daily. 11/09/19 02/22/20  Buford Dresser, MD  traZODone (DESYREL) 100 MG tablet Take 100 mg by mouth at bedtime. 02/10/20   [provider]  Vitamin D, Cholecalciferol, 50 MCG (2000 UT) CAPS Take 4,000 Units by mouth daily.     [provider]    Allergies    Geodon [ziprasidone hydrochloride], Lithium, Talwin [pentazocine], Toradol [ketorolac tromethamine], Ziprasidone, Abilify [aripiprazole], Compazine [prochlorperazine edisylate], Latuda [lurasidone hcl], Pristiq [desvenlafaxine succinate er], Rexulti [brexpiprazole], and Diflucan [fluconazole]  Review of Systems   Review of Systems  Constitutional:  Positive for fatigue. Negative for activity change, appetite change, chills, diaphoresis, fever and unexpected weight change.  HENT:  Positive for congestion, sore throat and voice change. Negative for tinnitus and trouble swallowing.   Eyes: Negative.   Respiratory:  Positive for cough, chest tightness and shortness of breath.   Cardiovascular:  Positive for palpitations. Negative for chest pain and leg swelling.  Gastrointestinal: Negative.   Genitourinary: Negative.   Musculoskeletal: Negative.   Skin: Negative.   Neurological:  Positive for light-headedness. Negative for dizziness, tremors, weakness, numbness and headaches.  Hematological: Negative.   Psychiatric/Behavioral:  Positive for dysphoric mood and suicidal ideas. The patient is nervous/anxious.    Physical  Exam Updated Vital Signs BP (!) 149/69 (BP Location: Right Arm)   Pulse 69   Temp 98.3 F (36.8 C)   Resp 20   SpO2 97%   Physical Exam Vitals and nursing note reviewed.  Constitutional:      Appearance: She is obese. She is not toxic-appearing.  HENT:     Head: Normocephalic and atraumatic.     Nose: Nose normal.     Mouth/Throat:     Mouth:  Mucous membranes are moist.     Pharynx: Oropharynx is clear. Uvula midline. No oropharyngeal exudate or posterior oropharyngeal erythema.     Tonsils: No tonsillar exudate.     Comments: posterior pharyngeal cobblestoning Eyes:     General: Lids are normal. Vision grossly intact.        Right eye: No discharge.        Left eye: No discharge.     Extraocular Movements: Extraocular movements intact.     Conjunctiva/sclera: Conjunctivae normal.     Pupils: Pupils are equal, round, and reactive to light.  Neck:     Trachea: Trachea and phonation normal.  Cardiovascular:     Rate and Rhythm: Normal rate and regular rhythm.     Pulses: Normal pulses.     Heart sounds: Normal heart sounds. No murmur heard. Pulmonary:     Effort: Pulmonary effort is normal. No tachypnea, bradypnea, accessory muscle usage, prolonged expiration or respiratory distress.     Breath sounds: Normal breath sounds. No wheezing or rales.  Chest:     Chest wall: No mass, lacerations, deformity, swelling, tenderness, crepitus or edema.  Abdominal:     General: Bowel sounds are normal. There is no distension.     Palpations: Abdomen is soft.     Tenderness: There is no abdominal tenderness. There is no guarding or rebound.  Musculoskeletal:        General: No deformity.     Cervical back: Normal range of motion and neck supple.     Right lower leg: No edema.     Left lower leg: No edema.  Lymphadenopathy:     Cervical: No cervical adenopathy.  Skin:    General: Skin is warm and dry.     Capillary Refill: Capillary refill takes less than 2 seconds.   Neurological:     Mental Status: She is alert and oriented to person, place, and time. Mental status is at baseline.     Sensory: Sensation is intact.     Motor: Motor function is intact.  Psychiatric:        Mood and Affect: Mood is anxious and depressed. Affect is labile and tearful.        Speech: Speech is rapid and pressured.        Thought Content: Thought content includes suicidal ideation. Thought content does not include homicidal ideation. Thought content does not include suicidal plan.    ED Results / Procedures / Treatments   Labs (all labs ordered are listed, but only abnormal results are displayed) Labs Reviewed  COMPREHENSIVE METABOLIC PANEL - Abnormal; Notable for the following components:      Result Value   BUN 24 (*)    Creatinine, Ser 1.22 (*)    GFR, Estimated 46 (*)    All other components within normal limits  URINALYSIS, ROUTINE W REFLEX MICROSCOPIC - Abnormal; Notable for the following components:   Leukocytes,Ua SMALL (*)    All other components within normal limits  BRAIN NATRIURETIC PEPTIDE - Abnormal; Notable for the following components:   B Natriuretic Peptide 1,009.1 (*)    All other components within normal limits  URINALYSIS, MICROSCOPIC (REFLEX) - Abnormal; Notable for the following components:   Bacteria, UA MANY (*)    All other components within normal limits  RESP PANEL BY RT-PCR (FLU A&B, COVID) ARPGX2  URINE CULTURE  CBC WITH DIFFERENTIAL/PLATELET  TSH  MAGNESIUM  PROTIME-INR  TROPONIN I (HIGH SENSITIVITY)  TROPONIN I (HIGH SENSITIVITY)  EKG EKG: normal sinus rhythm, no STEMI.   Radiology DG Chest Portable 1 View  Result Date: 09/04/2020 CLINICAL DATA:  Chest tightness and palpitations. Shortness of breath. EXAM: PORTABLE CHEST 1 VIEW COMPARISON:  07/22/2020 FINDINGS: The heart size and mediastinal contours are within normal limits. Both lungs are clear. The visualized skeletal structures are unremarkable. IMPRESSION: No  active disease. Electronically Signed   By: Lucienne Capers M.D.   On: 09/04/2020 18:16    Procedures Procedures   Medications Ordered in ED Medications - No data to display  ED Course  I have reviewed the triage vital signs and the nursing notes.  Pertinent labs & imaging results that were available during my care of the patient were reviewed by me and considered in my medical decision making (see chart for details).    MDM Rules/Calculators/A&P                         75 year old female with extensive medical history presents with concern for palpitations over the weekend, with some associated chest pressure and shortness of breath.  Differential diagnosis includes but is limited to dysrhythmia, AV block, ACS, cardiomyopathy, CHF, valvular disease, panic attack/anxiety, drug-induced effect, you, thyroid anomaly, metabolic derangement, sepsis.  Patient extremely tearful on intake, bradycardic with a rate in the 50s.  Vital signs otherwise normal.  Cardiopulmonary exam is normal, abdominal exam is benign.  Patient is neurovascularly intact in all 4 extremities.  Patient does endorse suicidal ideation without clear plan.  States she milligrams her psychiatrist and is looking into intensive outpatient therapy.  Will proceed with palpitations, and will likely connect with TTS prior to discharge, should she be able to be dispositioned outpatient.  Chest x-ray negative for acute cardiopulmonary disease. EKG with sinus rhythm without ischemic change.  CBC unremarkable, CMP with baseline creatinine of 1.22, respiratory pathogen panel negative, UA unremarkable, troponin is negative, 7 and then delta troponin negative, 6.  TSH is normal at 1.4 magnesium is normal and coag studies are normal.  BNP is elevated to 1009, however patient without clinical evidence of fluid overload.  Established with cardiology in the outpatient setting.  From a palpitation perspective, patient has been medically  cleared.  She has also undergone TTS evaluation for suicidal ideation and they recommended proceeding with intensive outpatient therapy, however patient is cleared to be discharged from the emergency department tonight.  No further work-up warranted in the ED at this time.  Kayonna voiced understanding of her medical evaluation and treatment plan.  Each of her questions was answered to her expressed satisfaction.  Recommend she follow-up closely with her cardiologist for repeat Holter monitor.  Return precautions were given.  Patient is stable and appropriate for discharge at this time.  This chart was dictated using voice recognition software, Dragon. Despite the best efforts of this provider to proofread and correct errors, errors may still occur which can change documentation meaning.  Final Clinical Impression(s) / ED Diagnoses Final diagnoses:  Palpitations  Suicidal ideation    Rx / DC Orders ED Discharge Orders     None        Aura Dials 09/05/20 0027    Charlesetta Shanks, MD 09/14/20 1521

## 2020-09-04 NOTE — ED Notes (Signed)
TTS machine set up in room and called connected with provider at this time
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
## 2020-09-05 ENCOUNTER — Telehealth (HOSPITAL_COMMUNITY): Payer: Self-pay | Admitting: Psychiatry

## 2020-09-05 ENCOUNTER — Other Ambulatory Visit (HOSPITAL_COMMUNITY): Payer: Medicare Other

## 2020-09-05 NOTE — Discharge Instructions (Addendum)
You are seen in the ER today for your palpitations.  Your physical exam, vital signs, blood work, and EKG were very reassuring.  Below is contact information for a cardiologist in the area with whom you should follow-up, or you may call your established cardiologist.  Recommend you proceed with repeat Holter monitor.  Please call Dellia Nims first thing tomorrow morning to discuss her placement in the intensive outpatient program.  Return to the emergency department develop any worsening palpitations, chest pain, shortness of breath, nausea or vomiting does not stop, or any other new severe symptoms.  Return also if you develop any recurring thoughts about harming yourself or if you develop any thoughts of harming anyone else.

## 2020-09-05 NOTE — Telephone Encounter (Signed)
D: Placed call to pt.  A:  Provided pt with [facility] phone number [phone]) since pt wanted a group as soon as possible.  R: Pt receptive.

## 2020-09-06 DIAGNOSIS — Z03818 Encounter for observation for suspected exposure to other biological agents ruled out: Secondary | ICD-10-CM | POA: Diagnosis not present

## 2020-09-06 DIAGNOSIS — M791 Myalgia, unspecified site: Secondary | ICD-10-CM | POA: Diagnosis not present

## 2020-09-06 DIAGNOSIS — R053 Chronic cough: Secondary | ICD-10-CM | POA: Diagnosis not present

## 2020-09-06 DIAGNOSIS — R051 Acute cough: Secondary | ICD-10-CM | POA: Diagnosis not present

## 2020-09-06 LAB — URINE CULTURE

## 2020-09-08 ENCOUNTER — Other Ambulatory Visit: Payer: Self-pay

## 2020-09-08 DIAGNOSIS — R053 Chronic cough: Secondary | ICD-10-CM

## 2020-09-09 ENCOUNTER — Ambulatory Visit: Payer: Medicare Other | Admitting: Adult Health

## 2020-09-12 ENCOUNTER — Other Ambulatory Visit: Payer: Self-pay | Admitting: Cardiology

## 2020-09-12 ENCOUNTER — Telehealth: Payer: Self-pay | Admitting: Cardiology

## 2020-09-12 NOTE — Telephone Encounter (Signed)
Left message to call back  

## 2020-09-12 NOTE — Telephone Encounter (Signed)
    STAT if HR is under 50 or over 120 (normal HR is 60-100 beats per minute)  What is your heart rate? Always 100+  Do you have a log of your heart rate readings (document readings)?   Do you have any other symptoms? Pt said she's been having high HR and BP, she said sometimes if she take 2 losartan it should resole it now she will take up to 3 losartan and still did not help. She said the heat also making her feel dizzy and feels like she is going to passed out

## 2020-09-13 DIAGNOSIS — R059 Cough, unspecified: Secondary | ICD-10-CM | POA: Diagnosis not present

## 2020-09-15 ENCOUNTER — Telehealth: Payer: Self-pay | Admitting: Cardiology

## 2020-09-15 NOTE — Telephone Encounter (Signed)
Left message to call back  

## 2020-09-15 NOTE — Telephone Encounter (Signed)
  Patient c/o Palpitations:  High priority if patient c/o lightheadedness, shortness of breath, or chest pain  How long have you had palpitations/irregular HR/ Afib? Are you having the symptoms now? A week  Are you currently experiencing lightheadedness, SOB or CP? Couple of times she has had lightheadedness but not today, did have an episode yesterday  Do you have a history of afib (atrial fibrillation) or irregular heart rhythm? Hx of palpitations  Have you checked your BP or HR? (document readings if available): 172/90 hr 88 today  Are you experiencing any other symptoms? "Sensation" in her chest, not pain

## 2020-09-16 ENCOUNTER — Telehealth: Payer: Self-pay | Admitting: Cardiology

## 2020-09-16 NOTE — Telephone Encounter (Signed)
Patient c/o Palpitations:  High priority if patient c/o lightheadedness, shortness of breath, or chest pain  How long have you had palpitations/irregular HR/ Afib? Are you having the symptoms now? More than a week  Are you currently experiencing lightheadedness, SOB or CP?  No just Anxiety  Do you have a history of afib (atrial fibrillation) or irregular heart rhythm? Yes  Have you checked your BP or HR? (document readings if available): Yes 124/76 HR 88  Are you experiencing any other symptoms? Tired, pt isn't sure if it is her depression

## 2020-09-16 NOTE — Telephone Encounter (Signed)
Returned call to patient- patient reports increased palpitations x 1 week.   Reports with exertion she feels her heart start racing.   Reports her BP cuff has been reading "irregular heartbeat".  BP today 172/90 HR 88.  Reports HR has been in the 100s.   Denies CP, SOB, lightheadedness.    She denies caffeine intake.    She does report having increased anxiety.    Reports she went to ER 6/16 for palpitations but report not having them while there.   The Zio monitor ordered did not stick and she was only able to wear this 3 days.    No report available.     Advised will send to Dr. Harrell Gave to review and aware if any acute change to proceed to ER for evaluation.  Patient aware.

## 2020-09-22 ENCOUNTER — Emergency Department (HOSPITAL_COMMUNITY): Payer: Medicare Other

## 2020-09-22 ENCOUNTER — Emergency Department (HOSPITAL_COMMUNITY)
Admission: EM | Admit: 2020-09-22 | Discharge: 2020-09-23 | Disposition: A | Discharge status: Home / Self Care | Payer: Medicare Other | Attending: Emergency Medicine | Admitting: Emergency Medicine

## 2020-09-22 DIAGNOSIS — E039 Hypothyroidism, unspecified: Secondary | ICD-10-CM | POA: Diagnosis not present

## 2020-09-22 DIAGNOSIS — N189 Chronic kidney disease, unspecified: Secondary | ICD-10-CM | POA: Diagnosis not present

## 2020-09-22 DIAGNOSIS — R45851 Suicidal ideations: Secondary | ICD-10-CM

## 2020-09-22 DIAGNOSIS — F332 Major depressive disorder, recurrent severe without psychotic features: Secondary | ICD-10-CM | POA: Diagnosis not present

## 2020-09-22 DIAGNOSIS — R6889 Other general symptoms and signs: Secondary | ICD-10-CM | POA: Diagnosis not present

## 2020-09-22 DIAGNOSIS — Y900 Blood alcohol level of less than 20 mg/100 ml: Secondary | ICD-10-CM | POA: Diagnosis not present

## 2020-09-22 DIAGNOSIS — R079 Chest pain, unspecified: Secondary | ICD-10-CM | POA: Diagnosis not present

## 2020-09-22 DIAGNOSIS — Z20822 Contact with and (suspected) exposure to covid-19: Secondary | ICD-10-CM | POA: Diagnosis not present

## 2020-09-22 DIAGNOSIS — Z79899 Other long term (current) drug therapy: Secondary | ICD-10-CM | POA: Diagnosis not present

## 2020-09-22 DIAGNOSIS — I10 Essential (primary) hypertension: Secondary | ICD-10-CM | POA: Diagnosis not present

## 2020-09-22 DIAGNOSIS — R404 Transient alteration of awareness: Secondary | ICD-10-CM | POA: Diagnosis not present

## 2020-09-22 DIAGNOSIS — Z85528 Personal history of other malignant neoplasm of kidney: Secondary | ICD-10-CM | POA: Insufficient documentation

## 2020-09-22 DIAGNOSIS — Z743 Need for continuous supervision: Secondary | ICD-10-CM | POA: Diagnosis not present

## 2020-09-22 DIAGNOSIS — R002 Palpitations: Secondary | ICD-10-CM | POA: Diagnosis not present

## 2020-09-22 DIAGNOSIS — R1110 Vomiting, unspecified: Secondary | ICD-10-CM | POA: Diagnosis not present

## 2020-09-22 DIAGNOSIS — I129 Hypertensive chronic kidney disease with stage 1 through stage 4 chronic kidney disease, or unspecified chronic kidney disease: Secondary | ICD-10-CM | POA: Insufficient documentation

## 2020-09-22 DIAGNOSIS — F329 Major depressive disorder, single episode, unspecified: Secondary | ICD-10-CM | POA: Diagnosis present

## 2020-09-22 DIAGNOSIS — I499 Cardiac arrhythmia, unspecified: Secondary | ICD-10-CM | POA: Diagnosis not present

## 2020-09-22 LAB — CBC WITH DIFFERENTIAL/PLATELET
Abs Immature Granulocytes: 0.02 10*3/uL (ref 0.00–0.07)
Basophils Absolute: 0 10*3/uL (ref 0.0–0.1)
Basophils Relative: 0 %
Eosinophils Absolute: 0.2 10*3/uL (ref 0.0–0.5)
Eosinophils Relative: 5 %
HCT: 39.4 % (ref 36.0–46.0)
Hemoglobin: 13 g/dL (ref 12.0–15.0)
Immature Granulocytes: 0 %
Lymphocytes Relative: 26 %
Lymphs Abs: 1.3 10*3/uL (ref 0.7–4.0)
MCH: 28.2 pg (ref 26.0–34.0)
MCHC: 33 g/dL (ref 30.0–36.0)
MCV: 85.5 fL (ref 80.0–100.0)
Monocytes Absolute: 0.3 10*3/uL (ref 0.1–1.0)
Monocytes Relative: 7 %
Neutro Abs: 3 10*3/uL (ref 1.7–7.7)
Neutrophils Relative %: 62 %
Platelets: 223 10*3/uL (ref 150–400)
RBC: 4.61 MIL/uL (ref 3.87–5.11)
RDW: 13.4 % (ref 11.5–15.5)
WBC: 4.9 10*3/uL (ref 4.0–10.5)
nRBC: 0 % (ref 0.0–0.2)

## 2020-09-22 LAB — RAPID URINE DRUG SCREEN, HOSP PERFORMED
Amphetamines: NOT DETECTED
Barbiturates: NOT DETECTED
Benzodiazepines: POSITIVE — AB
Cocaine: NOT DETECTED
Opiates: NOT DETECTED
Tetrahydrocannabinol: NOT DETECTED

## 2020-09-22 LAB — BASIC METABOLIC PANEL
Anion gap: 9 (ref 5–15)
BUN: 19 mg/dL (ref 8–23)
CO2: 28 mmol/L (ref 22–32)
Calcium: 9.6 mg/dL (ref 8.9–10.3)
Chloride: 102 mmol/L (ref 98–111)
Creatinine, Ser: 1.09 mg/dL — ABNORMAL HIGH (ref 0.44–1.00)
GFR, Estimated: 53 mL/min — ABNORMAL LOW (ref >60)
Glucose, Bld: 168 mg/dL — ABNORMAL HIGH (ref 70–99)
Potassium: 3.9 mmol/L (ref 3.5–5.1)
Sodium: 139 mmol/L (ref 135–145)

## 2020-09-22 LAB — RESP PANEL BY RT-PCR (FLU A&B, COVID) ARPGX2
Influenza A by PCR: NEGATIVE
Influenza B by PCR: NEGATIVE
SARS Coronavirus 2 by RT PCR: NEGATIVE

## 2020-09-22 LAB — ETHANOL: Alcohol, Ethyl (B): <10 mg/dL (ref <10)

## 2020-09-22 MED ORDER — TRAZODONE HCL 100 MG PO TABS
100.0000 mg | ORAL_TABLET | Freq: Every day | ORAL | Status: DC
  Administered 2020-09-22: 100 mg via ORAL
  Filled 2020-09-22: qty 1

## 2020-09-22 MED ORDER — LEVOTHYROXINE SODIUM 88 MCG PO TABS
88.0000 ug | ORAL_TABLET | Freq: Every day | ORAL | Status: DC
  Filled 2020-09-22: qty 1

## 2020-09-22 MED ORDER — LORAZEPAM 0.5 MG PO TABS
0.5000 mg | ORAL_TABLET | Freq: Three times a day (TID) | ORAL | Status: DC | PRN
  Administered 2020-09-22 – 2020-09-23 (×3): 0.5 mg via ORAL
  Filled 2020-09-22 (×3): qty 1

## 2020-09-22 MED ORDER — LORAZEPAM 1 MG PO TABS
1.0000 mg | ORAL_TABLET | Freq: Once | ORAL | Status: AC
  Administered 2020-09-22: 1 mg via ORAL
  Filled 2020-09-22: qty 1

## 2020-09-22 MED ORDER — ROSUVASTATIN CALCIUM 5 MG PO TABS
5.0000 mg | ORAL_TABLET | Freq: Every day | ORAL | Status: DC
  Administered 2020-09-22 – 2020-09-23 (×2): 5 mg via ORAL
  Filled 2020-09-22 (×2): qty 1

## 2020-09-22 MED ORDER — DULOXETINE HCL 30 MG PO CPEP
60.0000 mg | ORAL_CAPSULE | Freq: Every day | ORAL | Status: DC
  Administered 2020-09-22 – 2020-09-23 (×2): 60 mg via ORAL
  Filled 2020-09-22 (×2): qty 2

## 2020-09-22 MED ORDER — HYDROCHLOROTHIAZIDE 12.5 MG PO CAPS
12.5000 mg | ORAL_CAPSULE | Freq: Two times a day (BID) | ORAL | Status: DC
  Administered 2020-09-22 – 2020-09-23 (×2): 12.5 mg via ORAL
  Filled 2020-09-22 (×2): qty 1

## 2020-09-22 MED ORDER — ROPINIROLE HCL 1 MG PO TABS
1.0000 mg | ORAL_TABLET | Freq: Once | ORAL | Status: AC
  Administered 2020-09-22: 1 mg via ORAL
  Filled 2020-09-22: qty 1

## 2020-09-22 MED ORDER — LOSARTAN POTASSIUM-HCTZ 50-12.5 MG PO TABS: 1.0000 | ORAL_TABLET | Freq: Two times a day (BID) | ORAL | Status: DC

## 2020-09-22 MED ORDER — LOSARTAN POTASSIUM 50 MG PO TABS
50.0000 mg | ORAL_TABLET | Freq: Two times a day (BID) | ORAL | Status: DC
  Administered 2020-09-22 – 2020-09-23 (×2): 50 mg via ORAL
  Filled 2020-09-22: qty 1
  Filled 2020-09-22 (×2): qty 2

## 2020-09-22 NOTE — ED Notes (Signed)
Patient tearful, upset, agitated that she got her medications so late. She thinks the staff here does not care about her. RN told patient, yes, we do care about her. There is a process to get medication ordered and sent from the Pharmacy that takes time. Patient is also upset and frustrated that her friend did not answer the phone.

## 2020-09-22 NOTE — ED Notes (Signed)
Patient becoming agitated because she has not received her Requip. RN called Pharmacy. They are tubing medication soon.

## 2020-09-22 NOTE — ED Notes (Signed)
Patient given warm blanket.

## 2020-09-22 NOTE — BH Assessment (Addendum)
09/22/2020:  Patient per Victoriano Lain NP is recommended for a Geropsych inpatient admission. Patient is not appropriate for an admission to [facility] due to medical, more suitable for [facility]. Disposition Clinician faxed patient to the facilities listed below. CSW will continue follow for patient's placement needs.     [facility]  Pending - No Request Sent N/A [address] [place] [address] [phone] --  [facility]  Pending - No Request Sent N/A [address], [place] [address] [phone] --  [facility]  Pending - No Request Sent N/A [address] [place] [address] [phone] --  [facility]  Pending - No Request Sent N/A [address] [place] [address] [phone] --  [facility]  Pending - No Request Sent N/A [place], [place] [address] [phone] --  [facility]  Pending - No Request Sent N/A [place], [place] [address] [phone] [facility]  Pending - No Request Sent N/A [address] [place] [address] [phone] --  [facility]  Pending - No Request Sent N/A [address], [place] [address] [phone] --  [facility]  Pending - No Request Sent N/A [address], [place] [address] [phone] --  [facility]  Pending - No Request Sent N/A [address] [place] [address] [phone] --  [facility]  Pending - No Request Sent N/A [place], [place] [address] [phone] --  [facility]  Pending - No Request Sent N/A [address], [place] [address] [phone] --  [facility]  Pending - No Request Sent N/A [address] [place] [address] [phone] --  [facility]  Pending - No Request Sent N/A [address] [place] [address] [phone] --  [place]  Pending - No Request Sent N/A [address] Diamantina Monks [place] [address] [phone] --  [facility]  Pending - No Request Sent N/A [address] [place] [address] [phone] --  [facility]  Pending - No Request Sent N/A [address], [place] [address] [phone] --  [facility]  Pending - No Request Sent N/A [address] Wandra Feinstein [place] [address] [phone] --  [facility]  Pending - No Request Sent N/A [address], [place] [address] [phone] --  [facility]  Pending - No Request Sent N/A [address], [place] [address] [phone] --  [facility]  Pending - No Request Sent N/A [address], [place] [address] [phone] --  [facility]  Pending - No Request Sent N/A [place], [place] [address] [phone] --  [facility]  Pending - No Request Sent N/A [address] [place] [address] [phone] --  [facility]  Pending - No Request Sent N/A [place], [place] [address] [phone] --  [facility]  Pending - No Request Sent N/A [address], [place] [address] [phone] --  [facility]  Pending - No Request Sent N/A [address] [place] [address] [phone] --  [facility]  Pending - No  Request [facility] [address] [place] [address] [phone] --  [facility]  Pending - No Request Sent N/A [place]., [place] [address] [phone] --  [facility]  Pending - No Request Sent N/A [address], [place] [address] [phone] --  [facility]  Pending - No Request Sent N/A [address], [place] [address] [place]  Pending - No Request Sent N/A [address] [place] [address] [phone] --

## 2020-09-22 NOTE — ED Triage Notes (Signed)
Per EMS, patient from home, c/o palpitations today. Hx anxiety and depression. SI without plan with EMS. EMS reports patient was more open about complaints after partner left the room.

## 2020-09-22 NOTE — ED Notes (Signed)
Patient requesting Requip medication. This was not put in as a scheduled order yet, RN notified Dr. Maryan Rued and Dr. Dina Rich. Awaiting orders.

## 2020-09-22 NOTE — ED Notes (Signed)
Lunch tray provided. 

## 2020-09-22 NOTE — ED Provider Notes (Signed)
[facility] DEPT Provider Note   CSN: [id number] Arrival date & time: 09/22/20  1148     History No chief complaint on file.   [name] is a 75 y.o. female.  Patient is a 75 year old female with a history of CAD, hypertension, prior renal cancer status post nephrectomy, hypothyroidism, depression that has been resistant to past medications and anxiety who is presenting today with worsening symptoms of depression and suicidal thoughts.  Patient states that for a while now she is felt more depressed but in the last few months it has gotten severe.  She has lost any interest in doing the things she usually like to do, has no worth for her life and would prefer that she just did not wake up.  She is also having poor relationship with her significant other.  It 2 days ago he reported she needed to leave the house and find somewhere else to live.  Patient states she does not even know where to start and she does not have much money and now she does not know where to go as she is estranged from her family.  She denies any substance use.  No recent medication changes.  No fever, cough, congestion, shortness of breath or chest pain.  She has chronic generalized pain in her knees shoulders and back but thinks it is related to her depression making everything worse.  The history is provided by the patient.      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 FFR of LAD  not  performed because of difficulty with catheter control from the right radial.    Chronic cough 02/22/2020   Chronic kidney disease    hx of kidney cancer   Diastolic dysfunction, left ventricle 05/31/2013   Difficult intubation    told by [facility] that she was hard to intubate 15 yrs ago in [place]- surgery since then no problems   Dyspnea    Essential hypertension 05/31/2013   Generalized anxiety disorder    Adequate for discharge    GERD (gastroesophageal reflux disease)    History of attention deficit disorder    History of echocardiogram 07/2012   Normal LVF w grade I siastolic dysfunction    History of endometriosis    History of gall stones 12/31/2009   History of pleural effusion    Hyperlipidemia 06/24/2016   LDL goal <70   Hypertension    Hypothyroidism 1990   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 Elsworth Soho discussed inspire device, patient would need to lose weight   PAT (paroxysmal atrial tachycardia)  s/p ablation   PONV (postoperative nausea and vomiting)    PTSD (post-traumatic stress disorder)    Pure hypercholesterolemia 02/18/2019   PVC's (premature ventricular contractions)    Renal mass 02/15/2012   Restless legs syndrome (RLS) 02/20/2007   Vertigo     Patient Active Problem List   Diagnosis Date Noted   Chronic cough 02/22/2020   Bursitis of right shoulder 05/24/2019   Pure hypercholesterolemia 02/18/2019   PONV (postoperative nausea and vomiting)    Migraine headaches    History of pleural effusion    History of endometriosis    GERD (gastroesophageal reflux disease)    Chronic kidney disease    CAD (coronary  artery disease)    Acute vestibular neuronitis    Abscess of Bartholin's gland    ADHD (attention deficit hyperactivity disorder)    Vertigo 01/26/2017   Hyperlipidemia LDL goal <70 06/24/2016   PTSD (post-traumatic stress disorder)    Major depressive disorder    PVC's (premature ventricular contractions) 83/41/9622   Diastolic dysfunction, left ventricle 05/31/2013   PAT (paroxysmal atrial tachycardia) ([place]) 05/31/2013   Essential hypertension 05/31/2013   Obesity 05/31/2013   Generalized anxiety disorder    Renal mass 02/15/2012   History of gall stones 12/31/2009   Obstructive sleep apnea 02/20/2007   Restless legs syndrome (RLS) 02/20/2007   Acquired hypothyroidism 03/22/1988    Past Surgical History:  Procedure Laterality Date   CARDIAC ELECTROPHYSIOLOGY MAPPING AND ABLATION  2000s   LAPAROSCOPIC NEPHRECTOMY, HAND ASSISTED Right 07/31/2019   Procedure: HAND ASSISTED LAPAROSCOPIC NEPHRECTOMY CONVERTED TO OPEN WITH REPAIR OF INFERIOR VENA CAVAOTOMY;  Surgeon: Robley Fries, MD;  Location: [facility];  Service: Urology;  Laterality: Right;  3 HRS   laparoscopy     LEFT HEART CATH AND CORONARY ANGIOGRAPHY N/A 06/08/2016   Procedure: Left Heart Cath and Coronary Angiography;  Surgeon: Belva Crome, MD;  Location: [facility];  Service: Cardiovascular;  Laterality: N/A;   nasoseptal reconstruction  1990s   NEPHRECTOMY RECIPIENT Right 08/2019   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   [place]     as a child   TUBAL LIGATION  1970s   uterine mass removal  03/2012   was found to be benign     OB History   No obstetric history on file.     Family History  Problem Relation Age of Onset   Allergies Father    Skin cancer Father    Alcohol abuse Father    Mental illness Father    Heart disease Mother    Depression Mother    Hypertension Mother    CAD Mother    Colon cancer Paternal Grandmother    OCD Paternal Grandmother    Multiple  sclerosis Sister    Autoimmune disease Brother        unknown type   Allergies Daughter    Heart disease Maternal Grandfather    Cancer Paternal Grandfather    Parkinson's disease Paternal Grandfather    Mental illness Maternal Grandmother     Social History   Tobacco Use   Smoking status: Never   Smokeless tobacco: Never  Vaping Use   Vaping Use: Never used  Substance Use Topics   Alcohol use: Not Currently    Alcohol/week: 1.0 standard drink    Types: 1 Glasses of wine per week   Drug use: No    Home Medications Prior to Admission medications   Medication Sig  Start Date End Date Taking? Authorizing Provider  b complex vitamins tablet Take 1 tablet by mouth daily.    [provider]  diphenhydrAMINE (BENADRYL) 25 mg capsule Take 25 mg by mouth every 6 (six) hours as needed for allergies.    [provider]  DULoxetine (CYMBALTA) 60 MG capsule Take 60 mg by mouth daily. 07/11/20   [provider]  fluticasone (FLONASE) 50 MCG/ACT nasal spray Place 2 sprays into both nostrils as needed.  03/02/19   [provider]  levothyroxine (SYNTHROID) 88 MCG tablet 1 tablet in the morning on an empty stomach    [provider]  LORazepam (ATIVAN) 0.5 MG tablet TAKE 1 TABLET (0.5 MG TOTAL) BY MOUTH 3 (THREE) TIMES DAILY AS NEEDED FOR ANXIETY. 12/10/19   [name], MD  losartan-hydrochlorothiazide [facility]) 50-12.5 MG tablet Take 1 tablet by mouth 2 (two) times daily. 08/04/20   Almyra Deforest, PA  Menthol, Topical Analgesic, (BLUE GEL EX) Apply 1 application topically every 6 (six) hours as needed (restless legs). BLUE ICE PAIN 3% Menthol    [provider]  methylphenidate (RITALIN) 5 MG tablet Take 1 tablet (5 mg total) by mouth 2 (two) times daily with breakfast and lunch. 09/27/19 02/22/20  [name], MD  Omega 3 1000 MG CAPS Take 1,000 mg by mouth daily.    [provider]  ondansetron (ZOFRAN ODT) 4 MG disintegrating tablet  Take 1 tablet (4 mg total) by mouth every 8 (eight) hours as needed. 05/14/20   McDonald, Mia A, PA-C  polyethylene glycol powder (GLYCOLAX/MIRALAX) 17 GM/SCOOP powder Take 1 Container by mouth as needed.    [provider]  rOPINIRole (REQUIP) 0.5 MG tablet TAKE 1 TABLET BY MOUTH DAILY AFTER SUPPER & TAKE 2 TABLETS BY MOUTH AT BEDTIME 09/02/20   Rigoberto Noel, MD  rosuvastatin (CRESTOR) 5 MG tablet Take 1 tablet (5 mg total) by mouth daily. 11/09/19 02/22/20  Buford Dresser, MD  traZODone (DESYREL) 100 MG tablet Take 100 mg by mouth at bedtime. 02/10/20   [provider]  Vitamin D, Cholecalciferol, 50 MCG (2000 UT) CAPS Take 4,000 Units by mouth daily.     [provider]    Allergies    Geodon [ziprasidone hydrochloride], Lithium, Talwin [pentazocine], Toradol [ketorolac tromethamine], Ziprasidone, Abilify [aripiprazole], Compazine [prochlorperazine edisylate], Latuda [lurasidone hcl], Pristiq [desvenlafaxine succinate er], Rexulti [brexpiprazole], and Diflucan [fluconazole]  Review of Systems   Review of Systems  All other systems reviewed and are negative.  Physical Exam Updated Vital Signs BP (!) 152/94   Pulse 89   Temp 98 F (36.7 C) (Oral)   Resp 18   Ht 5\' 4"  (1.626 m)   Wt 97.5 kg   SpO2 100%   BMI 36.90 kg/m   Physical Exam Vitals and nursing note reviewed.  Constitutional:      General: She is not in acute distress.    Appearance: She is well-developed.  HENT:     Head: Normocephalic and atraumatic.  Eyes:     Pupils: Pupils are equal, round, and reactive to light.  Cardiovascular:     Rate and Rhythm: Normal rate and regular rhythm.     Heart sounds: Normal heart sounds. No murmur heard.   No friction rub.  Pulmonary:     Effort: Pulmonary effort is normal.     Breath sounds: Normal breath sounds. No wheezing or rales.  Abdominal:     General: Bowel sounds are normal. There is no distension.  Palpations: Abdomen is soft.      Tenderness: There is no abdominal tenderness. There is no guarding or rebound.     Comments: Multiple surgical scars over the abdomen that are well-healed.  Musculoskeletal:        General: No tenderness. Normal range of motion.     Right lower leg: No edema.     Left lower leg: No edema.     Comments: No edema  Skin:    General: Skin is warm and dry.     Capillary Refill: Capillary refill takes less than 2 seconds.     Findings: No rash.  Neurological:     Mental Status: She is alert and oriented to person, place, and time. Mental status is at baseline.     Cranial Nerves: No cranial nerve deficit.  Psychiatric:        Attention and Perception: Attention normal.        Mood and Affect: Affect is tearful.        Behavior: Behavior is slowed.        Thought Content: Thought content is not paranoid. Thought content includes suicidal ideation. Thought content does not include homicidal ideation. Thought content does not include suicidal plan.    ED Results / Procedures / Treatments   Labs (all labs ordered are listed, but only abnormal results are displayed) Labs Reviewed  BASIC METABOLIC PANEL - Abnormal; Notable for the following components:      Result Value   Glucose, Bld 168 (*)    Creatinine, Ser 1.09 (*)    GFR, Estimated 53 (*)    All other components within normal limits  RAPID URINE DRUG SCREEN, HOSP PERFORMED - Abnormal; Notable for the following components:   Benzodiazepines POSITIVE (*)    All other components within normal limits  RESP PANEL BY RT-PCR (FLU A&B, COVID) ARPGX2  CBC WITH DIFFERENTIAL/PLATELET  ETHANOL    EKG None ED ECG REPORT   Date: 09/22/2020  Rate: 87  Rhythm: normal sinus rhythm  QRS Axis: normal  Intervals: normal  ST/T Wave abnormalities: normal  Conduction Disutrbances:none  Narrative Interpretation:   Old EKG Reviewed: none available  I have personally reviewed the EKG tracing and agree with the computerized printout as  noted.   Radiology DG Chest Port 1 View  Result Date: 09/22/2020 CLINICAL DATA:  Heart palpitations and chest pain. EXAM: PORTABLE CHEST 1 VIEW COMPARISON:  09/04/2020 FINDINGS: Cardiac silhouette is normal in size. Normal mediastinal and hilar contours. Clear lungs.  No convincing pleural effusion.  No pneumothorax. Skeletal structures are grossly intact. IMPRESSION: No active disease. Electronically Signed   By: Lajean Manes M.D.   On: 09/22/2020 14:18    Procedures Procedures   Medications Ordered in ED Medications  LORazepam (ATIVAN) tablet 1 mg (has no administration in time range)    ED Course  I have reviewed the triage vital signs and the nursing notes.  Pertinent labs & imaging results that were available during my care of the patient were reviewed by me and considered in my medical decision making (see chart for details).    MDM Rules/Calculators/A&P                          Patient is a 75 year old female presenting today with heightened stress, anxiety, worsening social situation at home and worsening depression.  She is now having passive suicidal ideation.  Prior history of suicidal thoughts but no plan for suicide.  Patient has never tried to hurt herself before.  Patient's exam without acute medical findings today.  Low suspicion for acute underlying medical condition.  Feel that patient will need evaluation by mental health.  Labs are pending.  3:25 PM Labs wnl.   Pt is medicaly clear.  MDM   Amount and/or Complexity of Data Reviewed Clinical lab tests: ordered and reviewed Tests in the radiology section of CPT: ordered and reviewed Tests in the medicine section of CPT: ordered and reviewed     Final Clinical Impression(s) / ED Diagnoses Final diagnoses:  None    Rx / DC Orders ED Discharge Orders     None        Blanchie Dessert, MD 09/22/20 1526
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
## 2020-09-22 NOTE — ED Notes (Signed)
1 pt belonging bag in cabinet for rm 16-18. Pt changed into burgundy scrubs and wanded by security.

## 2020-09-23 LAB — URINALYSIS, ROUTINE W REFLEX MICROSCOPIC
Bilirubin Urine: NEGATIVE
Glucose, UA: NEGATIVE mg/dL
Hgb urine dipstick: NEGATIVE
Ketones, ur: NEGATIVE mg/dL
Leukocytes,Ua: NEGATIVE
Nitrite: NEGATIVE
Protein, ur: NEGATIVE mg/dL
Specific Gravity, Urine: 1.013 (ref 1.005–1.030)
pH: 6 (ref 5.0–8.0)

## 2020-09-23 MED ORDER — LEVOTHYROXINE SODIUM 50 MCG PO TABS
75.0000 ug | ORAL_TABLET | Freq: Every day | ORAL | Status: DC
  Administered 2020-09-23: 75 ug via ORAL
  Filled 2020-09-23: qty 1
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
## 2020-09-23 NOTE — Discharge Instructions (Addendum)
For your behavioral health needs, you are advised to follow up with the Partial Hospitalization Program (PHP) at the [facility] at [place].  This program meets Monday - Friday from 9:00 am - 1:00 pm.  Due to Covid-19 this program is currently virtual.  You are scheduled for a virtual intake appointment on Monday, September 29, 2020 at 2:00 pm.  If you have any questions, contact Lorin Glass, LCSW at the phone number indicated below.  She will send you a link to the meeting by e-mail 15 minutes before the appointment:       [facility] at [facility]. [address] [place], [place] [address]      Contact person: Lorin Glass, LCSW      [phone]

## 2020-09-23 NOTE — BH Assessment (Signed)
[place] Assessment Progress Note   Per Ricky Ala, NP, this voluntary pt does not require psychiatric hospitalization at this time.  Pt is psychiatrically cleared.  Pt would benefit from Partial Hospitalization Program at the [facility], which is currently being offered virtually due to Covid-19.  This Probation officer has spoken to pt and she would like to enroll; she adds that she has necessary IT support to participate in virtual programming.  I have spoken to Lorin Glass, LCSW, who has scheduled pt for intake on Monday, 09/25/2020 at 14:00.  This has been included in pt's discharge instructions.  Pt has been given a registration packet for the Outpatient Clinic, which she has been instructed to complete prior to the appointed time.  EDP's Sherwood Gambler, MD and Milton Ferguson, MD have been notified, as well as pt's nurse, [place].  A TOC consult has been ordered to address pt's psychosocial needs, and Christina Christovale, LCSW has been included in the conversation.  Jalene Mullet, [place] Triage Specialist [phone]

## 2020-09-23 NOTE — ED Notes (Signed)
Patient given a drink.

## 2020-09-23 NOTE — ED Notes (Signed)
TTS assessment at bedside.
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
## 2020-09-23 NOTE — ED Notes (Signed)
Pt DC d off unit to home per provider. Pt alert, calm, cooperative, no s/s of distress. DC information given to pt, pt acknowledged understanding. Belongings given to pt. Pt ambulatory off unit, escorted by security. Pt transported by significant other.

## 2020-09-23 NOTE — BH Assessment (Addendum)
Requested nursing to place TTS machine in patient's room for re-assessment.

## 2020-09-23 NOTE — BH Assessment (Addendum)
[place] Assessment Progress Note   Per Pecolia Ades, NP, this voluntary pt requires psychiatric hospitalization at this time, preferably at a facility providing specialty care for older adults.  Pt was referred to numerous facilities last night.  Today this Probation officer has following followed up as noted:  Information sent, decision pending: [facility] (occupied with internal traffic) [facility]  Attempted to reach: [employer] system (numerous calls, no response) [place] (left message at 10:30) [place] (left message at 12:34)  Declined: [employer] (due to medical acuity)  At capacity: Insurance account manager (unit currently closed) [facility] (unit currently closed) [facility]   If this voluntary pt is accepted to a facility, please discuss disposition with pt to be sure that she agrees to the plan.  If a facility agrees to accept pt and the plan changes in any way please call the facility to inform them of the change.  Final disposition is pending as of this writing.  Jalene Mullet, [place] Coordinator [phone]

## 2020-09-26 NOTE — Telephone Encounter (Signed)
I saw she was in the ER 7/4, ECG at that time was normal. I do not have any results from the monitor. If she still has the monitor, she can send it back--even three days of information will be helpful. Thanks.

## 2020-09-28 ENCOUNTER — Other Ambulatory Visit: Payer: Self-pay | Admitting: Pulmonary Disease

## 2020-09-28 DIAGNOSIS — G2581 Restless legs syndrome: Secondary | ICD-10-CM

## 2020-09-29 ENCOUNTER — Other Ambulatory Visit: Payer: Self-pay

## 2020-09-29 ENCOUNTER — Other Ambulatory Visit (HOSPITAL_COMMUNITY): Payer: Medicare Other | Attending: Psychiatry | Admitting: Licensed Clinical Social Worker

## 2020-09-29 DIAGNOSIS — K219 Gastro-esophageal reflux disease without esophagitis: Secondary | ICD-10-CM | POA: Insufficient documentation

## 2020-09-29 DIAGNOSIS — Z905 Acquired absence of kidney: Secondary | ICD-10-CM | POA: Insufficient documentation

## 2020-09-29 DIAGNOSIS — Z888 Allergy status to other drugs, medicaments and biological substances status: Secondary | ICD-10-CM | POA: Insufficient documentation

## 2020-09-29 DIAGNOSIS — R4589 Other symptoms and signs involving emotional state: Secondary | ICD-10-CM | POA: Insufficient documentation

## 2020-09-29 DIAGNOSIS — F909 Attention-deficit hyperactivity disorder, unspecified type: Secondary | ICD-10-CM | POA: Insufficient documentation

## 2020-09-29 DIAGNOSIS — I129 Hypertensive chronic kidney disease with stage 1 through stage 4 chronic kidney disease, or unspecified chronic kidney disease: Secondary | ICD-10-CM | POA: Insufficient documentation

## 2020-09-29 DIAGNOSIS — Z85528 Personal history of other malignant neoplasm of kidney: Secondary | ICD-10-CM | POA: Insufficient documentation

## 2020-09-29 DIAGNOSIS — E669 Obesity, unspecified: Secondary | ICD-10-CM | POA: Insufficient documentation

## 2020-09-29 DIAGNOSIS — E039 Hypothyroidism, unspecified: Secondary | ICD-10-CM | POA: Insufficient documentation

## 2020-09-29 DIAGNOSIS — I2510 Atherosclerotic heart disease of native coronary artery without angina pectoris: Secondary | ICD-10-CM | POA: Insufficient documentation

## 2020-09-29 DIAGNOSIS — F332 Major depressive disorder, recurrent severe without psychotic features: Secondary | ICD-10-CM | POA: Insufficient documentation

## 2020-09-29 DIAGNOSIS — Z79890 Hormone replacement therapy (postmenopausal): Secondary | ICD-10-CM | POA: Insufficient documentation

## 2020-09-29 DIAGNOSIS — Z883 Allergy status to other anti-infective agents status: Secondary | ICD-10-CM | POA: Insufficient documentation

## 2020-09-29 DIAGNOSIS — F900 Attention-deficit hyperactivity disorder, predominantly inattentive type: Secondary | ICD-10-CM

## 2020-09-29 DIAGNOSIS — F411 Generalized anxiety disorder: Secondary | ICD-10-CM

## 2020-09-29 DIAGNOSIS — R41844 Frontal lobe and executive function deficit: Secondary | ICD-10-CM | POA: Insufficient documentation

## 2020-09-29 DIAGNOSIS — G4733 Obstructive sleep apnea (adult) (pediatric): Secondary | ICD-10-CM | POA: Insufficient documentation

## 2020-09-29 DIAGNOSIS — F3130 Bipolar disorder, current episode depressed, mild or moderate severity, unspecified: Secondary | ICD-10-CM

## 2020-09-29 DIAGNOSIS — Z79899 Other long term (current) drug therapy: Secondary | ICD-10-CM | POA: Insufficient documentation

## 2020-09-29 NOTE — Telephone Encounter (Signed)
Dr. Alva, please advise if you are okay refilling med. 

## 2020-09-30 ENCOUNTER — Other Ambulatory Visit (HOSPITAL_COMMUNITY): Payer: Medicare Other | Admitting: Licensed Clinical Social Worker

## 2020-09-30 ENCOUNTER — Other Ambulatory Visit: Payer: Self-pay

## 2020-09-30 ENCOUNTER — Other Ambulatory Visit (HOSPITAL_COMMUNITY): Payer: Medicare Other | Admitting: Occupational Therapy

## 2020-09-30 DIAGNOSIS — F332 Major depressive disorder, recurrent severe without psychotic features: Secondary | ICD-10-CM | POA: Diagnosis not present

## 2020-09-30 DIAGNOSIS — R41844 Frontal lobe and executive function deficit: Secondary | ICD-10-CM

## 2020-09-30 DIAGNOSIS — F909 Attention-deficit hyperactivity disorder, unspecified type: Secondary | ICD-10-CM | POA: Diagnosis not present

## 2020-09-30 DIAGNOSIS — Z85528 Personal history of other malignant neoplasm of kidney: Secondary | ICD-10-CM | POA: Diagnosis not present

## 2020-09-30 DIAGNOSIS — G4733 Obstructive sleep apnea (adult) (pediatric): Secondary | ICD-10-CM | POA: Diagnosis not present

## 2020-09-30 DIAGNOSIS — K219 Gastro-esophageal reflux disease without esophagitis: Secondary | ICD-10-CM | POA: Diagnosis not present

## 2020-09-30 DIAGNOSIS — E7800 Pure hypercholesterolemia, unspecified: Secondary | ICD-10-CM | POA: Diagnosis not present

## 2020-09-30 DIAGNOSIS — Z888 Allergy status to other drugs, medicaments and biological substances status: Secondary | ICD-10-CM | POA: Diagnosis not present

## 2020-09-30 DIAGNOSIS — R4589 Other symptoms and signs involving emotional state: Secondary | ICD-10-CM | POA: Diagnosis present

## 2020-09-30 DIAGNOSIS — F411 Generalized anxiety disorder: Secondary | ICD-10-CM | POA: Diagnosis not present

## 2020-09-30 DIAGNOSIS — Z79899 Other long term (current) drug therapy: Secondary | ICD-10-CM | POA: Diagnosis not present

## 2020-09-30 DIAGNOSIS — R944 Abnormal results of kidney function studies: Secondary | ICD-10-CM | POA: Diagnosis not present

## 2020-09-30 DIAGNOSIS — Z905 Acquired absence of kidney: Secondary | ICD-10-CM | POA: Diagnosis not present

## 2020-09-30 DIAGNOSIS — I2510 Atherosclerotic heart disease of native coronary artery without angina pectoris: Secondary | ICD-10-CM | POA: Diagnosis not present

## 2020-09-30 DIAGNOSIS — I129 Hypertensive chronic kidney disease with stage 1 through stage 4 chronic kidney disease, or unspecified chronic kidney disease: Secondary | ICD-10-CM | POA: Diagnosis not present

## 2020-09-30 DIAGNOSIS — I10 Essential (primary) hypertension: Secondary | ICD-10-CM | POA: Diagnosis not present

## 2020-09-30 DIAGNOSIS — E669 Obesity, unspecified: Secondary | ICD-10-CM | POA: Diagnosis not present

## 2020-09-30 DIAGNOSIS — Z79890 Hormone replacement therapy (postmenopausal): Secondary | ICD-10-CM | POA: Diagnosis not present

## 2020-09-30 DIAGNOSIS — E039 Hypothyroidism, unspecified: Secondary | ICD-10-CM | POA: Diagnosis not present

## 2020-09-30 DIAGNOSIS — H40051 Ocular hypertension, right eye: Secondary | ICD-10-CM | POA: Diagnosis not present

## 2020-09-30 DIAGNOSIS — M1710 Unilateral primary osteoarthritis, unspecified knee: Secondary | ICD-10-CM | POA: Diagnosis not present

## 2020-09-30 DIAGNOSIS — Z883 Allergy status to other anti-infective agents status: Secondary | ICD-10-CM | POA: Diagnosis not present

## 2020-09-30 NOTE — Therapy (Signed)
[facility] [place], [place], [address] Phone: [phone]   Fax:  [phone] Virtual Visit via Video Note  I connected with [name] on 09/30/20 at  11:00 AM EDT by a video enabled telemedicine application and verified that I am speaking with the correct person using two identifiers.  Location: Patient: Patient Home Provider: Clinic Office   I discussed the limitations of evaluation and management by telemedicine and the availability of in person appointments. The patient expressed understanding and agreed to proceed.   I discussed the assessment and treatment plan with the patient. The patient was provided an opportunity to ask questions and all were answered. The patient agreed with the plan and demonstrated an understanding of the instructions.   The patient was advised to call back or seek an in-person evaluation if the symptoms worsen or if the condition fails to improve as anticipated.  I provided 84 minutes of non-face-to-face time during this encounter. 24 minutes OT Evaluation 60 minutes OT Therapy  Ponciano Ort, OT   Occupational Therapy Evaluation  Patient Details  Name: [name] MRN: [id number] Date of Birth: 01-10-1946 Referring Provider (OT): Ricky Ala   Encounter Date: 09/30/2020   OT End of Session - 09/30/20 1634     Visit Number 1    Number of Visits 20    Date for OT Re-Evaluation 10/28/20    Authorization Type [employer]    OT Start Time 1100   OT Eval [phone]   OT Stop Time 1200    OT Time Calculation (min) 60 min    Activity Tolerance Patient tolerated treatment well    Behavior During Therapy [facility] [place] for tasks assessed/performed             Past Medical History:  Diagnosis Date   Abscess of Bartholin's gland    Acquired hypothyroidism 1990   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 FFR of LAD  not performed because of difficulty with catheter control from the right radial.    Chronic cough 02/22/2020   Chronic kidney disease    hx of kidney cancer   Diastolic dysfunction, left ventricle 05/31/2013   Difficult intubation    told by [facility] that she was hard to intubate 15 yrs ago in [place]- surgery since then no problems   Dyspnea    Essential hypertension 05/31/2013   Generalized anxiety disorder    Adequate for discharge    GERD (gastroesophageal reflux disease)    History of attention deficit disorder    History of echocardiogram 07/2012   Normal LVF w grade I siastolic dysfunction    History of endometriosis    History of gall stones 12/31/2009   History of pleural effusion    Hyperlipidemia 06/24/2016   LDL goal <70   Hypertension    Hypothyroidism 1990   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 Elsworth Soho discussed inspire device, patient would need to lose weight   PAT (paroxysmal atrial tachycardia)    s/p ablation   PONV (postoperative nausea and vomiting)    PTSD (post-traumatic stress disorder)    Pure hypercholesterolemia 02/18/2019   PVC's (premature ventricular contractions)    Renal mass 02/15/2012   Restless legs syndrome (RLS) 02/20/2007   Vertigo     Past Surgical  History:  Procedure Laterality Date   CARDIAC ELECTROPHYSIOLOGY MAPPING AND ABLATION  2000s   LAPAROSCOPIC NEPHRECTOMY, HAND ASSISTED Right 07/31/2019   Procedure: HAND ASSISTED LAPAROSCOPIC NEPHRECTOMY CONVERTED TO OPEN WITH REPAIR OF INFERIOR VENA CAVAOTOMY;  Surgeon: Robley Fries, MD;  Location: [facility];  Service: Urology;  Laterality: Right;  3 HRS   laparoscopy     LEFT HEART CATH AND CORONARY ANGIOGRAPHY N/A 06/08/2016   Procedure: Left Heart Cath and Coronary Angiography;  Surgeon: Belva Crome, MD;  Location: [facility];  Service: Cardiovascular;  Laterality: N/A;   nasoseptal reconstruction  1990s   NEPHRECTOMY RECIPIENT Right 08/2019   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   [place]     as a child   TUBAL LIGATION  1970s   uterine mass removal  03/2012   was found to be benign    There were no vitals filed for this visit.   Subjective Assessment - 09/30/20 1632     Currently in Pain? No/denies               [facility] OT Assessment - 09/30/20 0001       Assessment   Medical Diagnosis Major depression    Referring Provider (OT) Ricky Ala      Precautions   Precautions None      Balance Screen   Has the patient fallen in the past 6 months No    Has the patient had a decrease in activity level because of a fear of falling?  No    Is the patient reluctant to leave their home because of a fear of falling?  No               OT Education - 09/30/20 1632     Education Details Educated on OT role within PHP programming and educated on different factors that contribute to our ability to manage our time, along with specific time management tips/strategies, including procrastination    Person(s) Educated Patient    Methods Explanation;Handout    Comprehension Verbalized understanding              OT Short Term Goals - 09/30/20 1647       OT SHORT TERM GOAL #1   Title Pt will actively engage in OT group sessions  throughout duration of PHP programming, in order to promote daily structure, social engagement, and opportunities to develop and utilize adaptive strategies to maximize functional performance in preparation for safe transition and integration back into school, work, and the community.    Time 4    Period Weeks    Status New    Target Date 10/28/20      OT SHORT TERM GOAL #2   Title Pt will practice and identify 1-3 adaptive coping strategies she can utilize, in order to safely manage increased depression/anxiety,  with min cues, in preparation for safe and healthy reintegration back into the community at discharge.    Time 4    Period Weeks    Status New    Target Date 10/28/20      OT SHORT TERM GOAL #3   Title Pt will identify 1-3 stress management strategies she can utilize, in order to safely manage increased psychosocial stressors identified, with min cues, in preparation for safe transition back to the community at discharge.    Time 4    Period Weeks    Status New    Target Date 10/28/20           Occupational Therapy Assessment 09/30/2020  [name] is a 75 y/o female with PMHx of depression, anxiety, and suicidal ideation who was referred to the [facility] program from the ED with reports of worsening anxiety in the context of psychosocial stressors. Pt reports recently breaking up with her fiance of three years and now having to relocate and find an apartment to live in. Pt is also struggling with basic self-care and hygiene, reporting she has not showered in "months"  and will often wear the same pajamas "for days." Pt reports a desire to engage in [name] programming in order to manage identified stressors and to engage meaningfully in identified areas of occupation and ADL/iADLs. Upon approach, pt presents as cooperative, calm, and forthcoming with information for OT Evaluation. Pt reports enjoying arts and crafts and identifies goal for admission "manage my anxiety".    Precautions/Limitations: None noted/observed  Cognition: Appears intact    Visual Motor: Pt wears glasses for distance   Living Situation: Pt is currently living with her ex-fiance of three years, reports now that they are separating she is looking for her own apartment. Pt also owns cats.   School/Work: Pt is retired; previously employed as a Secretary/administrator.   ADL/iADL Performance: Pt reports difficulty engaging in ADL/iADLs, reports not showering for several months and does not change her clothes for several days. Pt no longer cooks or clean, however reports she is still going to the grocery store for food.    Leisure Interests and Hobbies: Enjoys arts and crafts   Social Support: Reports limited/no supports   What do you do when you are very stressed, angry, upset, sad or anxious? Isolate from others, Yell/Scream, Cry, Sleep, and Listen to music   What helps when you are not feeling well? Wrapping in a blanket, Deep breathing, Calling a friend or family member, Having a warm or cool drink, Reading, and Listening to music  What are some things that make it MORE difficult for you when you are already upset? Not having choices/input, Particular time of day, Being criticized, Boredom/Lack of activities, and Particular time of year  Is there anything specific that you would like help with while you're in the partial hospitalization program? Coping Skills, Medication , Stress Management, Self-Care, Goal-setting, Sleep, Nutrition, and Self-esteem   What is your goal while you are here?  Manage my anxiety   Assessment: Pt demonstrates behavior that inhibits/restricts participation in occupation and would benefit from skilled occupational therapy services to address current difficulties with symptom management, emotion regulation, socialization, stress management, time management, job readiness, financial wellness, health and nutrition, sleep hygiene, ADL/iADL performance and leisure  participation, in preparation for reintegration and return to community at discharge.   Plan: Pt will participate in skilled occupational therapy sessions (group and/or individual) in order to promote daily structure, social engagement, and  opportunities to develop and utilize adaptive strategies to maximize functional performance in preparation for safe transition and integration back into school, work, and/or the community at discharge. OT sessions will occur 4-5 x per week for 2-4 weeks.   Ponciano Ort, MOT, OTR/L  Group Session:  S: "I'm trying to find things to fill my day."  O: Group began with a warm-up ice breaker activity where patients were encouraged to reflect on the scenario "If you had 86,400 dollars dropped into your bank account at midnight and 24 hours to spend it, what you use the money for? The only rule was that any money not used disappeared after 24 hours." Warm-up activity was used as an Designer, industrial/product and a way to connect that similar to the scenario of money, we do not get time back and there are 86,400 seconds in a day. Warm-up transitioned into today's group focused on topic of Time Management. Group members identified ways in which they currently struggle with managing their time and discussion focused on alternative strategies to recognize how we can be more productive and intentional with our time and managing it appropriately. Group members also discussed specific strategies to overcome procrastination.   A: [name] was active in her participation of discussion and activity, sharing that she struggles with finding things to fill her day now that she is retired. She also shared that she struggles to find the energy and motivation to do things, recently not taking the time to shower or change her clothes for several months. Pt appeared receptive and open to several strategies offered to her by other peers in group.   P: Continue to attend PHP OT group sessions 5x week for 2  weeks to promote daily structure, social engagement, and opportunities to develop and utilize adaptive strategies to maximize functional performance in preparation for safe transition and integration back into school, work, and the community. Plan to address topic of procrastination in next OT group session.  Plan - 09/30/20 1636     Clinical Impression Statement [name] is a 75 y/o female with PMHx of depression, anxiety, and suicidal ideation who was referred to the [facility] program from the ED with reports of worsening anxiety in the context of psychosocial stressors. Pt reports recently breaking up with her fiance of three years and now having to relocate and find an apartment to live in. Pt is also struggling with basic self-care and hygiene, reporting she has not showered in "months"  and will often wear the same pajamas "for days." Pt reports a desire to engage in [place] programming in order to manage identified stressors and to engage meaningfully in identified areas of occupation and ADL/iADLs.    OT Occupational Profile and History Problem Focused Assessment - Including review of records relating to presenting problem    Occupational performance deficits (Please refer to evaluation for details): ADL's;IADL's;Rest and Sleep;Education;Work;Leisure;Social Participation    Body Structure / Function / Physical Skills ADL    Cognitive Skills Attention;Emotional;Energy/Drive;Learn;Memory;Perception;Problem Solve;Safety Awareness;Temperament/Personality;Thought;Understand    Psychosocial Skills Coping Strategies;Environmental  Adaptations;Habits;Interpersonal Interaction;Routines and Behaviors    Rehab Potential Good    Clinical Decision Making Limited treatment options, no task modification necessary    Comorbidities Affecting Occupational Performance: May have comorbidities impacting occupational performance    Modification or Assistance to Complete Evaluation  No modification of tasks or assist necessary to  complete eval    OT Frequency 5x / week    OT Duration 4 weeks    OT Treatment/Interventions Self-care/ADL  training;Patient/family education;Balance training;Psychosocial skills training;Coping strategies training    Consulted and Agree with Plan of Care Patient             Patient will benefit from skilled therapeutic intervention in order to improve the following deficits and impairments:   Body Structure / Function / Physical Skills: ADL Cognitive Skills: Attention, Emotional, Energy/Drive, Learn, Memory, Perception, Problem Solve, Safety Awareness, Temperament/Personality, Thought, Understand Psychosocial Skills: Coping Strategies, Environmental  Adaptations, Habits, Interpersonal Interaction, Routines and Behaviors   Visit Diagnosis: Difficulty coping  Frontal lobe and executive function deficit  Severe episode of recurrent major depressive disorder, without psychotic features ([place])    Problem List Patient Active Problem List   Diagnosis Date Noted   Chronic cough 02/22/2020   Bursitis of right shoulder 05/24/2019   Pure hypercholesterolemia 02/18/2019   PONV (postoperative nausea and vomiting)    Migraine headaches    History of pleural effusion    History of endometriosis    GERD (gastroesophageal reflux disease)    Chronic kidney disease    CAD (coronary artery disease)    Acute vestibular neuronitis    Abscess of Bartholin's gland    ADHD (attention deficit hyperactivity disorder)    Vertigo 01/26/2017   Hyperlipidemia LDL goal <70 06/24/2016   PTSD (post-traumatic stress disorder)    Major depressive disorder    PVC's (premature ventricular contractions) 44/96/7591   Diastolic dysfunction, left ventricle 05/31/2013   PAT (paroxysmal atrial tachycardia) (HCC) 05/31/2013   Essential hypertension 05/31/2013   Obesity 05/31/2013   Generalized anxiety disorder    Renal mass 02/15/2012   History of gall stones 12/31/2009   Obstructive sleep apnea 02/20/2007    Restless legs syndrome (RLS) 02/20/2007   Acquired hypothyroidism 03/22/1988    09/30/2020  Ponciano Ort, MOT, OTR/L  09/30/2020, 4:50 PM  [place], [place], [address] Phone: [phone]   Fax:  [phone]  Name: [name] MRN: [id number] Date of Birth: 05/31/1945
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
## 2020-10-01 ENCOUNTER — Other Ambulatory Visit: Payer: Self-pay

## 2020-10-01 ENCOUNTER — Encounter (HOSPITAL_COMMUNITY): Payer: Self-pay

## 2020-10-01 ENCOUNTER — Encounter (HOSPITAL_COMMUNITY): Payer: Self-pay | Admitting: Family

## 2020-10-01 ENCOUNTER — Other Ambulatory Visit (HOSPITAL_COMMUNITY): Payer: Medicare Other | Admitting: Licensed Clinical Social Worker

## 2020-10-01 ENCOUNTER — Other Ambulatory Visit (HOSPITAL_COMMUNITY): Payer: Medicare Other | Admitting: Occupational Therapy

## 2020-10-01 DIAGNOSIS — F411 Generalized anxiety disorder: Secondary | ICD-10-CM

## 2020-10-01 DIAGNOSIS — F332 Major depressive disorder, recurrent severe without psychotic features: Secondary | ICD-10-CM | POA: Diagnosis not present

## 2020-10-01 DIAGNOSIS — R4589 Other symptoms and signs involving emotional state: Secondary | ICD-10-CM | POA: Diagnosis not present

## 2020-10-01 DIAGNOSIS — Z883 Allergy status to other anti-infective agents status: Secondary | ICD-10-CM | POA: Diagnosis not present

## 2020-10-01 DIAGNOSIS — Z79899 Other long term (current) drug therapy: Secondary | ICD-10-CM | POA: Diagnosis not present

## 2020-10-01 DIAGNOSIS — R41844 Frontal lobe and executive function deficit: Secondary | ICD-10-CM

## 2020-10-01 DIAGNOSIS — I129 Hypertensive chronic kidney disease with stage 1 through stage 4 chronic kidney disease, or unspecified chronic kidney disease: Secondary | ICD-10-CM | POA: Diagnosis not present

## 2020-10-01 DIAGNOSIS — Z85528 Personal history of other malignant neoplasm of kidney: Secondary | ICD-10-CM | POA: Diagnosis not present

## 2020-10-01 DIAGNOSIS — I2510 Atherosclerotic heart disease of native coronary artery without angina pectoris: Secondary | ICD-10-CM | POA: Diagnosis not present

## 2020-10-01 DIAGNOSIS — G4733 Obstructive sleep apnea (adult) (pediatric): Secondary | ICD-10-CM | POA: Diagnosis not present

## 2020-10-01 DIAGNOSIS — E039 Hypothyroidism, unspecified: Secondary | ICD-10-CM | POA: Diagnosis not present

## 2020-10-01 DIAGNOSIS — K219 Gastro-esophageal reflux disease without esophagitis: Secondary | ICD-10-CM | POA: Diagnosis not present

## 2020-10-01 DIAGNOSIS — Z905 Acquired absence of kidney: Secondary | ICD-10-CM | POA: Diagnosis not present

## 2020-10-01 DIAGNOSIS — Z888 Allergy status to other drugs, medicaments and biological substances status: Secondary | ICD-10-CM | POA: Diagnosis not present

## 2020-10-01 MED ORDER — TRAZODONE HCL 100 MG PO TABS: 100.0000 mg | ORAL_TABLET | Freq: Every evening | ORAL | 0 refills | Status: DC | PRN

## 2020-10-01 NOTE — Therapy (Signed)
[facility] [place], [place], [address] Phone: [phone]   Fax:  [phone] Virtual Visit via Video Note  I connected with [name] on 10/01/20 at  12:00 PM EDT by a video enabled telemedicine application and verified that I am speaking with the correct person using two identifiers.  Location: Patient: Patient Home Provider: Clinic Office   I discussed the limitations of evaluation and management by telemedicine and the availability of in person appointments. The patient expressed understanding and agreed to proceed.   I discussed the assessment and treatment plan with the patient. The patient was provided an opportunity to ask questions and all were answered. The patient agreed with the plan and demonstrated an understanding of the instructions.   The patient was advised to call back or seek an in-person evaluation if the symptoms worsen or if the condition fails to improve as anticipated.  I provided 50 minutes of non-face-to-face time during this encounter.   Ponciano Ort, OT   Occupational Therapy Treatment  Patient Details  Name: [name] MRN: [id number] Date of Birth: Feb 09, 1946 Referring Provider (OT): Ricky Ala   Encounter Date: 10/01/2020   OT End of Session - 10/01/20 1459     Visit Number 2    Number of Visits 20    Date for OT Re-Evaluation 10/28/20    Authorization Type [employer]    OT Start Time 1200    OT Stop Time 1250    OT Time Calculation (min) 50 min    Activity Tolerance Patient tolerated treatment well    Behavior During Therapy [facility] for tasks assessed/performed             Past Medical History:  Diagnosis Date   Abscess of Bartholin's gland    Acquired hypothyroidism 1990   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 FFR of LAD  not performed because of difficulty with catheter control from the right radial.    Chronic cough 02/22/2020   Chronic kidney disease    hx of kidney cancer   Diastolic dysfunction, left ventricle 05/31/2013   Difficult intubation    told by [employer] that she was hard to intubate 15 yrs ago in [place]- surgery since then no problems   Dyspnea    Essential hypertension 05/31/2013   Generalized anxiety disorder    Adequate for discharge    GERD (gastroesophageal reflux disease)    History of attention deficit disorder    History of echocardiogram 07/2012   Normal LVF w grade I siastolic dysfunction    History of endometriosis    History of gall stones 12/31/2009   History of pleural effusion    Hyperlipidemia 06/24/2016   LDL goal <70   Hypertension    Hypothyroidism 1990   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 Elsworth Soho discussed inspire device, patient would need to lose weight   PAT (paroxysmal atrial tachycardia)    s/p ablation   PONV (postoperative nausea and vomiting)    PTSD (post-traumatic stress disorder)    Pure hypercholesterolemia 02/18/2019   PVC's (premature ventricular contractions)    Renal mass 02/15/2012   Restless legs syndrome (RLS) 02/20/2007   Vertigo     Past Surgical History:  Procedure Laterality Date   CARDIAC ELECTROPHYSIOLOGY  MAPPING AND ABLATION  2000s   LAPAROSCOPIC NEPHRECTOMY, HAND ASSISTED Right 07/31/2019   Procedure: HAND ASSISTED LAPAROSCOPIC NEPHRECTOMY CONVERTED TO OPEN WITH REPAIR OF INFERIOR VENA CAVAOTOMY;  Surgeon: Robley Fries, MD;  [facility];  Service: Urology;  Laterality: Right;  3 HRS   laparoscopy     LEFT HEART CATH AND CORONARY ANGIOGRAPHY N/A 06/08/2016   Procedure: Left Heart Cath and Coronary Angiography;  Surgeon: Belva Crome, MD;  Location: [place] [facility];  Service: Cardiovascular;  Laterality: N/A;   nasoseptal reconstruction  1990s   NEPHRECTOMY RECIPIENT Right 08/2019   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   Subjective Assessment - 10/01/20 1458     Currently in Pain? No/denies                                  OT Education - 10/01/20 1458     Education Details Continued education on different factors that contribute to our ability to manage our time, along with specific time management tips/strategies, including procrastination    Person(s) Educated Patient    Methods Explanation;Handout    Comprehension Verbalized understanding              OT Short Term Goals - 10/01/20 1459       OT SHORT TERM GOAL #1   Title Pt will actively engage in OT group sessions throughout duration of PHP programming, in order to promote daily structure, social engagement, and opportunities to develop and utilize adaptive strategies to maximize functional performance in preparation for safe transition and integration back into school, work, and the community.    Status On-going      OT SHORT TERM GOAL #2   Title Pt will practice and identify 1-3 adaptive coping strategies she can utilize, in order to safely manage increased depression/anxiety, with min cues, in preparation  for safe and healthy reintegration back into the community at discharge.    Status On-going      OT SHORT TERM GOAL #3   Title Pt will identify 1-3 stress management strategies she can utilize, in order to safely manage increased psychosocial stressors identified, with min cues, in preparation for safe transition back to the community at discharge.    Status On-going           Group Session:  S: "I have a cleaning service that helps to clean the house and get things that I don't have the energy to do."  O: Group began with a recap on strategies/tips learned from previous OT session focused on  time management. Today's group continued to focus on topic of time management and reviewed additional strategies to manage and create schedules that are more efficient and effective in meeting their needs.  A: [name] was moderately engaged in today's session, however participation was limited due to patient's level of fatigue. Pt asked to lay down and joined virtually in group from her bedroom. Pt appeared attentive to discussion and shared that one thing she has found helpful is hiring a cleaning service to get the chores done around the house when she is not motivated or physically able. Appeared open and receptive to additional strategies reviewed, notably tactics to overcome or work through procrastination.   P: Continue to attend PHP OT group sessions 5x week for 2 weeks to promote daily structure, social engagement, and opportunities to develop and utilize adaptive strategies to maximize functional performance in preparation for safe transition and integration back into school, work, and the community. Plan to address topic of goal-setting in next OT group session.  Plan - 10/01/20 1459     Occupational performance deficits (Please refer to evaluation for details): ADL's;IADL's;Rest and Sleep;Education;Work;Leisure;Social Participation    Body Structure / Function / Physical Skills ADL    Cognitive  Skills Attention;Emotional;Energy/Drive;Learn;Memory;Perception;Problem Solve;Safety Awareness;Temperament/Personality;Thought;Understand    Psychosocial Skills Coping Strategies;Environmental  Adaptations;Habits;Interpersonal Interaction;Routines and Behaviors             Patient will benefit from skilled therapeutic intervention in order to improve the following deficits and impairments:   Body Structure / Function / Physical Skills: ADL Cognitive Skills: Attention, Emotional, Energy/Drive, Learn, Memory, Perception, Problem Solve, Safety Awareness, Temperament/Personality, Thought, Understand Psychosocial Skills: Coping Strategies, Environmental  Adaptations, Habits, Interpersonal Interaction, Routines and Behaviors   Visit Diagnosis: Difficulty coping  Frontal lobe and executive function deficit  Severe episode of recurrent major depressive disorder, without psychotic features ([place])    Problem List Patient Active Problem List   Diagnosis Date Noted   Chronic cough 02/22/2020   Bursitis of right shoulder 05/24/2019   Pure hypercholesterolemia 02/18/2019   PONV (postoperative nausea and vomiting)    Migraine headaches    History of pleural effusion    History of endometriosis    GERD (gastroesophageal reflux disease)    Chronic kidney disease    CAD (coronary artery disease)    Acute vestibular neuronitis    Abscess of Bartholin's gland    ADHD (attention deficit hyperactivity disorder)    Vertigo 01/26/2017   Hyperlipidemia LDL goal <70 06/24/2016   PTSD (post-traumatic stress disorder)    Major depressive disorder    PVC's (premature ventricular contractions) 96/06/5407   Diastolic dysfunction, left ventricle 05/31/2013   PAT (paroxysmal atrial tachycardia) (HCC) 05/31/2013   Essential hypertension 05/31/2013   Obesity 05/31/2013   Generalized anxiety disorder    Renal mass 02/15/2012   History of gall stones 12/31/2009   Obstructive sleep apnea 02/20/2007    Restless legs syndrome (RLS) 02/20/2007   Acquired hypothyroidism 03/22/1988    10/01/2020  Ponciano Ort, MOT, OTR/L  10/01/2020, 3:00 PM  [facility] [place], [place], [address] Phone: [phone]   Fax:  [phone]  Name: [name] MRN: [id number] Date of Birth: 06/15/1945

## 2020-10-01 NOTE — Telephone Encounter (Signed)
Left message to call back  

## 2020-10-01 NOTE — Progress Notes (Signed)
Spoke with patient via Webex video, used 2 identifiers to correctly identify patient. States this is her 2nd time in PHP but the first time was many years ago. She was in patient with [facility] for her heart but found out it was anxiety related and she was referred to [facility]. So far she thinks groups are "wonderful." She feels this is a great way for her to socialize and discuss her feelings. Her main stressor is the living situation she is currently in. Living with her ex-fiance and has became his caretaker. Feels as though she can no longer care for herself because she is caring so much for his needs. Wants to move out but at this time can't afford to. She is saving her money and hoping to get a voucher for an apartment. Has no relatives and only 2 friends. Had genetic testing done and found that she is resistant to medications.Would like to receive [place] in the future for her depression. Has no hope. On a scale 1-10 as 10 being worst she rates depression at 5 and anxiety at 8. Denies SI/HI or AV hallucinations. Today she has a headache and nausea but believes its related to her cataract and trying to focus on a computer screen. Denies any side effects from medications. No issues or complaints. PHQ9=20.

## 2020-10-02 ENCOUNTER — Other Ambulatory Visit (HOSPITAL_COMMUNITY): Payer: Medicare Other | Admitting: Occupational Therapy

## 2020-10-02 ENCOUNTER — Encounter (HOSPITAL_COMMUNITY): Payer: Self-pay

## 2020-10-02 ENCOUNTER — Other Ambulatory Visit (HOSPITAL_COMMUNITY): Payer: Medicare Other | Admitting: Licensed Clinical Social Worker

## 2020-10-02 DIAGNOSIS — R4589 Other symptoms and signs involving emotional state: Secondary | ICD-10-CM

## 2020-10-02 DIAGNOSIS — Z883 Allergy status to other anti-infective agents status: Secondary | ICD-10-CM | POA: Diagnosis not present

## 2020-10-02 DIAGNOSIS — Z79899 Other long term (current) drug therapy: Secondary | ICD-10-CM | POA: Diagnosis not present

## 2020-10-02 DIAGNOSIS — I2510 Atherosclerotic heart disease of native coronary artery without angina pectoris: Secondary | ICD-10-CM | POA: Diagnosis not present

## 2020-10-02 DIAGNOSIS — K219 Gastro-esophageal reflux disease without esophagitis: Secondary | ICD-10-CM | POA: Diagnosis not present

## 2020-10-02 DIAGNOSIS — F332 Major depressive disorder, recurrent severe without psychotic features: Secondary | ICD-10-CM | POA: Diagnosis not present

## 2020-10-02 DIAGNOSIS — Z905 Acquired absence of kidney: Secondary | ICD-10-CM | POA: Diagnosis not present

## 2020-10-02 DIAGNOSIS — F411 Generalized anxiety disorder: Secondary | ICD-10-CM

## 2020-10-02 DIAGNOSIS — Z888 Allergy status to other drugs, medicaments and biological substances status: Secondary | ICD-10-CM | POA: Diagnosis not present

## 2020-10-02 DIAGNOSIS — I129 Hypertensive chronic kidney disease with stage 1 through stage 4 chronic kidney disease, or unspecified chronic kidney disease: Secondary | ICD-10-CM | POA: Diagnosis not present

## 2020-10-02 DIAGNOSIS — Z85528 Personal history of other malignant neoplasm of kidney: Secondary | ICD-10-CM | POA: Diagnosis not present

## 2020-10-02 DIAGNOSIS — E039 Hypothyroidism, unspecified: Secondary | ICD-10-CM | POA: Diagnosis not present

## 2020-10-02 DIAGNOSIS — G4733 Obstructive sleep apnea (adult) (pediatric): Secondary | ICD-10-CM | POA: Diagnosis not present

## 2020-10-02 DIAGNOSIS — R41844 Frontal lobe and executive function deficit: Secondary | ICD-10-CM

## 2020-10-02 NOTE — Therapy (Signed)
[place], [place], [address] Phone: [phone]   Fax:  [phone] Virtual Visit via Video Note  I connected with [name] on 10/02/20 at  11:00 AM EDT by a video enabled telemedicine application and verified that I am speaking with the correct person using two identifiers.  Location: Patient: Patient Home Provider: Clinic Office   I discussed the limitations of evaluation and management by telemedicine and the availability of in person appointments. The patient expressed understanding and agreed to proceed.   I discussed the assessment and treatment plan with the patient. The patient was provided an opportunity to ask questions and all were answered. The patient agreed with the plan and demonstrated an understanding of the instructions.   The patient was advised to call back or seek an in-person evaluation if the symptoms worsen or if the condition fails to improve as anticipated.  I provided 60 minutes of non-face-to-face time during this encounter.   Ponciano Ort, OT   Occupational Therapy Treatment  Patient Details  Name: [name] MRN: [id number] Date of Birth: 18-Mar-1946 Referring Provider (OT): Ricky Ala   Encounter Date: 10/02/2020   OT End of Session - 10/02/20 1317     Visit Number 3    Number of Visits 20    Date for OT Re-Evaluation 10/28/20    Authorization Type [employer]    OT Start Time 1115    OT Stop Time 1215    OT Time Calculation (min) 60 min    Activity Tolerance Patient tolerated treatment well    Behavior During Therapy [facility] for tasks assessed/performed             Past Medical History:  Diagnosis Date   Abscess of Bartholin's gland    Acquired hypothyroidism 1990   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 FFR of LAD  not performed because of difficulty with catheter control from the right radial.    Chronic cough 02/22/2020   Chronic kidney disease    hx of kidney cancer   Diastolic dysfunction, left ventricle 05/31/2013   Difficult intubation    told by [employer] that she was hard to intubate 15 yrs ago in [place]- surgery since then no problems   Dyspnea    Essential hypertension 05/31/2013   Generalized anxiety disorder    Adequate for discharge    GERD (gastroesophageal reflux disease)    History of attention deficit disorder    History of echocardiogram 07/2012   Normal LVF w grade I siastolic dysfunction    History of endometriosis    History of gall stones 12/31/2009   History of pleural effusion    Hyperlipidemia 06/24/2016   LDL goal <70   Hypertension    Hypothyroidism 1990   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 Elsworth Soho discussed inspire device, patient would need to lose weight   PAT (paroxysmal atrial tachycardia)    s/p ablation   PONV (postoperative nausea and vomiting)    PTSD (post-traumatic stress disorder)    Pure hypercholesterolemia 02/18/2019   PVC's (premature ventricular contractions)    Renal mass 02/15/2012   Restless legs syndrome (RLS) 02/20/2007   Vertigo     Past Surgical History:  Procedure Laterality Date   CARDIAC ELECTROPHYSIOLOGY  MAPPING AND ABLATION  2000s   LAPAROSCOPIC NEPHRECTOMY, HAND ASSISTED Right 07/31/2019   Procedure: HAND ASSISTED LAPAROSCOPIC NEPHRECTOMY CONVERTED TO OPEN WITH REPAIR OF INFERIOR VENA CAVAOTOMY;  Surgeon: Robley Fries, MD;  Location: [facility];  Service: Urology;  Laterality: Right;  3 HRS   laparoscopy     LEFT HEART CATH AND CORONARY ANGIOGRAPHY N/A 06/08/2016   Procedure: Left Heart Cath and Coronary Angiography;  Surgeon: Belva Crome, MD;  Location: [place] [facility];  Service: Cardiovascular;  Laterality: N/A;   nasoseptal reconstruction  1990s   NEPHRECTOMY RECIPIENT Right 08/2019   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   [place]     as a child   TUBAL LIGATION  1970s   uterine mass removal  03/2012   was found to be benign    There were no vitals filed for this visit.   Subjective Assessment - 10/02/20 1317     Currently in Pain? No/denies              OT Education - 10/02/20 1317     Education Details Educated on fight-or-flight response and use of relaxation strategies including deep breathing, guided imagery, and PMR    Person(s) Educated Patient    Methods Explanation;Handout    Comprehension Verbalized understanding              OT Short Term Goals - 10/01/20 1459       OT SHORT TERM GOAL #1   Title Pt will actively engage in OT group sessions throughout duration of PHP programming, in order to promote daily structure, social engagement, and opportunities to develop and utilize adaptive strategies to maximize functional performance in preparation for safe transition and integration back into school, work, and the community.    Status On-going      OT SHORT TERM GOAL #2   Title Pt will practice and identify 1-3 adaptive coping strategies she can utilize, in order to safely manage increased depression/anxiety, with min cues, in preparation for safe and healthy reintegration back into the community at discharge.    Status  On-going      OT SHORT TERM GOAL #3   Title Pt will identify 1-3 stress management strategies she can utilize, in order to safely manage increased psychosocial stressors identified, with min cues, in preparation for safe transition back to the community at discharge.    Status On-going           Group Session:  S: "I like to sleep, or just lay down to get away from people and everything."  O:  Group began with a warm-up activity and group members were encouraged to share and identify ways in which they have practiced relaxation in the past, including any specific strategies or hobbies. Today's discussion focused on the topic of RELAXATION and patients reviewed and engaged in a  variety of relaxation strategies techniques including deep breathing, guided imagery/meditation, and progressive muscle relaxation. After each strategy was reviewed, group members were invited to engage in an active practice of relaxation strategies identified. Review and background on the fight-or-flight response was also provided.   A: [name] was active and independent in her participation of discussion and activity, sharing that she likes to relax by laying down or taking a nap. She also shared a benefit from listening to classical music and deep breathing to a count of "6". Pt was an active group participant and shared several other strategies she has found helpful, while also being receptive to strategies and techniques provided by other group members.   P: Continue to attend PHP OT group sessions 5x week for 2 weeks to promote daily structure, social engagement, and opportunities to develop and utilize adaptive strategies to maximize functional performance in preparation for safe transition and integration back into school, work, and the community.   Plan - 10/02/20 1317     Occupational performance deficits (Please refer to evaluation for details): ADL's;IADL's;Rest and Sleep;Education;Work;Leisure;Social  Participation    Body Structure / Function / Physical Skills ADL    Cognitive Skills Attention;Emotional;Energy/Drive;Learn;Memory;Perception;Problem Solve;Safety Awareness;Temperament/Personality;Thought;Understand    Psychosocial Skills Coping Strategies;Environmental  Adaptations;Habits;Interpersonal Interaction;Routines and Behaviors             Patient will benefit from skilled therapeutic intervention in order to improve the following deficits and impairments:   Body Structure / Function / Physical Skills: ADL Cognitive Skills: Attention, Emotional, Energy/Drive, Learn, Memory, Perception, Problem Solve, Safety Awareness, Temperament/Personality, Thought, Understand Psychosocial Skills: Coping Strategies, Environmental  Adaptations, Habits, Interpersonal Interaction, Routines and Behaviors   Visit Diagnosis: Difficulty coping  Frontal lobe and executive function deficit  Severe episode of recurrent major depressive disorder, without psychotic features ([place])    Problem List Patient Active Problem List   Diagnosis Date Noted   Chronic cough 02/22/2020   Bursitis of right shoulder 05/24/2019   Pure hypercholesterolemia 02/18/2019   PONV (postoperative nausea and vomiting)    Migraine headaches    History of pleural effusion    History of endometriosis    GERD (gastroesophageal reflux disease)    Chronic kidney disease    CAD (coronary artery disease)    Acute vestibular neuronitis    Abscess of Bartholin's gland    ADHD (attention deficit hyperactivity disorder)    Vertigo 01/26/2017   Hyperlipidemia LDL goal <70 06/24/2016   PTSD (post-traumatic stress disorder)    Major depressive disorder    PVC's (premature ventricular contractions) 90/30/0923   Diastolic dysfunction, left ventricle 05/31/2013   PAT (paroxysmal atrial tachycardia) (HCC) 05/31/2013   Essential hypertension 05/31/2013   Obesity 05/31/2013   Generalized anxiety disorder    Renal mass 02/15/2012    History of gall stones 12/31/2009   Obstructive sleep apnea 02/20/2007   Restless legs syndrome (RLS) 02/20/2007   Acquired hypothyroidism 03/22/1988    10/02/2020  Ponciano Ort, MOT, OTR/L  10/02/2020, 1:18 PM  [place], [place], [address] Phone: [phone]   Fax:  [phone]  Name: [name] MRN: [id number] Date of Birth: Nov 11, 1945

## 2020-10-03 ENCOUNTER — Other Ambulatory Visit (HOSPITAL_COMMUNITY): Payer: Medicare Other | Admitting: Licensed Clinical Social Worker

## 2020-10-03 ENCOUNTER — Encounter (HOSPITAL_COMMUNITY): Payer: Self-pay

## 2020-10-03 ENCOUNTER — Other Ambulatory Visit (HOSPITAL_COMMUNITY): Payer: Medicare Other | Admitting: Occupational Therapy

## 2020-10-03 ENCOUNTER — Other Ambulatory Visit: Payer: Self-pay

## 2020-10-03 DIAGNOSIS — I129 Hypertensive chronic kidney disease with stage 1 through stage 4 chronic kidney disease, or unspecified chronic kidney disease: Secondary | ICD-10-CM | POA: Diagnosis not present

## 2020-10-03 DIAGNOSIS — K219 Gastro-esophageal reflux disease without esophagitis: Secondary | ICD-10-CM | POA: Diagnosis not present

## 2020-10-03 DIAGNOSIS — Z79899 Other long term (current) drug therapy: Secondary | ICD-10-CM | POA: Diagnosis not present

## 2020-10-03 DIAGNOSIS — I2510 Atherosclerotic heart disease of native coronary artery without angina pectoris: Secondary | ICD-10-CM | POA: Diagnosis not present

## 2020-10-03 DIAGNOSIS — F332 Major depressive disorder, recurrent severe without psychotic features: Secondary | ICD-10-CM

## 2020-10-03 DIAGNOSIS — Z905 Acquired absence of kidney: Secondary | ICD-10-CM | POA: Diagnosis not present

## 2020-10-03 DIAGNOSIS — G4733 Obstructive sleep apnea (adult) (pediatric): Secondary | ICD-10-CM | POA: Diagnosis not present

## 2020-10-03 DIAGNOSIS — R4589 Other symptoms and signs involving emotional state: Secondary | ICD-10-CM | POA: Diagnosis not present

## 2020-10-03 DIAGNOSIS — F411 Generalized anxiety disorder: Secondary | ICD-10-CM

## 2020-10-03 DIAGNOSIS — Z85528 Personal history of other malignant neoplasm of kidney: Secondary | ICD-10-CM | POA: Diagnosis not present

## 2020-10-03 DIAGNOSIS — R41844 Frontal lobe and executive function deficit: Secondary | ICD-10-CM

## 2020-10-03 DIAGNOSIS — Z888 Allergy status to other drugs, medicaments and biological substances status: Secondary | ICD-10-CM | POA: Diagnosis not present

## 2020-10-03 DIAGNOSIS — Z883 Allergy status to other anti-infective agents status: Secondary | ICD-10-CM | POA: Diagnosis not present

## 2020-10-03 DIAGNOSIS — E039 Hypothyroidism, unspecified: Secondary | ICD-10-CM | POA: Diagnosis not present

## 2020-10-03 NOTE — Telephone Encounter (Signed)
Attempted to contact pt x 2. Left message to call back 

## 2020-10-03 NOTE — Therapy (Signed)
[place], [place], [address] Phone: [phone]   Fax:  [phone] Virtual Visit via Video Note  I connected with [name] on 10/03/20 at  11:00 AM EDT by a video enabled telemedicine application and verified that I am speaking with the correct person using two identifiers.  Location: Patient: Patient Home Provider: Clinic Office   I discussed the limitations of evaluation and management by telemedicine and the availability of in person appointments. The patient expressed understanding and agreed to proceed.     I discussed the assessment and treatment plan with the patient. The patient was provided an opportunity to ask questions and all were answered. The patient agreed with the plan and demonstrated an understanding of the instructions.   The patient was advised to call back or seek an in-person evaluation if the symptoms worsen or if the condition fails to improve as anticipated.  I provided 30 minutes of non-face-to-face time during this encounter.   Ponciano Ort, OT   Occupational Therapy Treatment  Patient Details  Name: [name] MRN: [id number] Date of Birth: [birth year] Referring Provider (OT): Ricky Ala   Encounter Date: 10/03/2020   OT End of Session - 10/03/20 1418     Visit Number 4    Number of Visits 20    Date for OT Re-Evaluation 10/28/20    Authorization Type [employer]    OT Start Time 1130    OT Stop Time 1200    OT Time Calculation (min) 30 min    Activity Tolerance Patient tolerated treatment well;Other (comment)   Participation limited d/t poor internet connection   Behavior During Therapy [facility] for tasks assessed/performed             Past Medical History:  Diagnosis Date   Abscess of Bartholin's gland    Acquired hypothyroidism 1990   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 FFR of LAD  not performed because of difficulty with catheter control from the right radial.    Chronic cough 02/22/2020   Chronic kidney disease    hx of kidney cancer   Diastolic dysfunction, left ventricle 05/31/2013   Difficult intubation    told by [employer] that she was hard to intubate 15 yrs ago in [place]- surgery since then no problems   Dyspnea    Essential hypertension 05/31/2013   Generalized anxiety disorder    Adequate for discharge    GERD (gastroesophageal reflux disease)    History of attention deficit disorder    History of echocardiogram 07/2012   Normal LVF w grade I siastolic dysfunction    History of endometriosis    History of gall stones 12/31/2009   History of pleural effusion    Hyperlipidemia 06/24/2016   LDL goal <70   Hypertension    Hypothyroidism 1990   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 Elsworth Soho discussed inspire device, patient would need to lose weight   PAT (paroxysmal atrial tachycardia)    s/p ablation   PONV (postoperative nausea and vomiting)    PTSD (post-traumatic stress disorder)    Pure hypercholesterolemia 02/18/2019   PVC's (premature ventricular contractions)    Renal mass 02/15/2012   Restless legs syndrome (RLS) 02/20/2007   Vertigo     Past  Surgical History:  Procedure Laterality Date   CARDIAC ELECTROPHYSIOLOGY MAPPING AND ABLATION  2000s   LAPAROSCOPIC NEPHRECTOMY, HAND ASSISTED Right 07/31/2019   Procedure: HAND ASSISTED LAPAROSCOPIC NEPHRECTOMY CONVERTED TO OPEN WITH REPAIR OF INFERIOR VENA CAVAOTOMY;  Surgeon: Robley Fries, MD;  [facility];  Service: Urology;  Laterality: Right;  3 HRS   laparoscopy     LEFT HEART CATH AND CORONARY ANGIOGRAPHY N/A 06/08/2016   Procedure: Left Heart Cath and Coronary Angiography;  Surgeon: Belva Crome, MD;  Location: [place] [facility];  Service: Cardiovascular;  Laterality: N/A;   nasoseptal reconstruction  1990s   NEPHRECTOMY RECIPIENT Right 08/2019   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   Subjective Assessment - 10/03/20 1417     Currently in Pain? No/denies                                  OT Education - 10/03/20 1418     Education Details Educated on brain fitness and healthy distractions as use for positive coping    Person(s) Educated Patient    Methods Explanation;Handout    Comprehension Verbalized understanding              OT Short Term Goals - 10/01/20 1459       OT SHORT TERM GOAL #1   Title Pt will actively engage in OT group sessions throughout duration of PHP programming, in order to promote daily structure, social engagement, and opportunities to develop and utilize adaptive strategies to maximize functional performance in preparation for safe transition and integration back into school, work, and the community.    Status On-going      OT SHORT TERM GOAL #2   Title Pt will practice and identify 1-3 adaptive coping strategies she can utilize, in order to safely manage increased depression/anxiety, with min cues, in preparation for safe and  healthy reintegration back into the community at discharge.    Status On-going      OT SHORT TERM GOAL #3   Title Pt will identify 1-3 stress management strategies she can utilize, in order to safely manage increased psychosocial stressors identified, with min cues, in preparation for safe transition back to the community at discharge.    Status On-going           Group Session:  S: None noted  O: Group encouraged increased social engagement and participation through discussion/activity focused on brain fitness. Patients were provided education on various brain fitness activities/strategies, with explanation provided on the qualifying factors including: one, that is has to be  challenging/hard and two, it has to be something that you do not do every day. Patients engaged actively during group session in various brain fitness activities to increase attention, concentration, and problem-solving skills. Discussion followed with a focus on identifying the benefits of brain fitness activities as use for adaptive coping strategies and distraction.   A: Marit[place]a was active in their participation of group discussion and activity, while present, however participation was limited due to poor Internet connection, leaving group and having to rejoin. Pt was an active participant and was observed smiling, laughing and engaging with peers. Pt validated and expressed that activities presented were distracting and noted benefit of improved mood.    P: Continue to attend PHP OT group sessions 5x week for 2 weeks to promote daily structure, social engagement, and opportunities to develop and utilize adaptive strategies to maximize functional performance in preparation for safe transition and integration back into school, work, and the community. Plan to address topic of goal-setting in next OT group session.   Plan - 10/03/20 1418     Occupational performance deficits (Please refer to evaluation for details):  ADL's;IADL's;Rest and Sleep;Education;Work;Leisure;Social Participation    Body Structure / Function / Physical Skills ADL    Cognitive Skills Attention;Emotional;Energy/Drive;Learn;Memory;Perception;Problem Solve;Safety Awareness;Temperament/Personality;Thought;Understand    Psychosocial Skills Coping Strategies;Environmental  Adaptations;Habits;Interpersonal Interaction;Routines and Behaviors             Patient will benefit from skilled therapeutic intervention in order to improve the following deficits and impairments:   Body Structure / Function / Physical Skills: ADL Cognitive Skills: Attention, Emotional, Energy/Drive, Learn, Memory, Perception, Problem Solve, Safety Awareness, Temperament/Personality, Thought, Understand Psychosocial Skills: Coping Strategies, Environmental  Adaptations, Habits, Interpersonal Interaction, Routines and Behaviors   Visit Diagnosis: Difficulty coping  Frontal lobe and executive function deficit  Severe episode of recurrent major depressive disorder, without psychotic features ([place])    Problem List Patient Active Problem List   Diagnosis Date Noted   Chronic cough 02/22/2020   Bursitis of right shoulder 05/24/2019   Pure hypercholesterolemia 02/18/2019   PONV (postoperative nausea and vomiting)    Migraine headaches    History of pleural effusion    History of endometriosis    GERD (gastroesophageal reflux disease)    Chronic kidney disease    CAD (coronary artery disease)    Acute vestibular neuronitis    Abscess of Bartholin's gland    ADHD (attention deficit hyperactivity disorder)    Vertigo 01/26/2017   Hyperlipidemia LDL goal <70 06/24/2016   PTSD (post-traumatic stress disorder)    Major depressive disorder    PVC's (premature ventricular contractions) 00/76/2263   Diastolic dysfunction, left ventricle 05/31/2013   PAT (paroxysmal atrial tachycardia) (HCC) 05/31/2013   Essential hypertension 05/31/2013   Obesity 05/31/2013    Generalized anxiety disorder    Renal mass 02/15/2012   History of gall stones 12/31/2009   Obstructive sleep apnea 02/20/2007   Restless legs syndrome (RLS) 02/20/2007   Acquired hypothyroidism 03/22/1988    10/03/2020  Ponciano Ort, MOT, OTR/L  10/03/2020, 2:19 PM  [place], [place], [address] Phone: [phone]   Fax:  [phone]  Name: [name] MRN: [id number] Date of Birth: 12/17/1945

## 2020-10-06 ENCOUNTER — Other Ambulatory Visit (HOSPITAL_COMMUNITY): Payer: Medicare Other

## 2020-10-06 ENCOUNTER — Ambulatory Visit (HOSPITAL_COMMUNITY): Payer: Medicare Other

## 2020-10-06 NOTE — Telephone Encounter (Signed)
Attempted to contact pt x 3. Left message to call back 

## 2020-10-07 ENCOUNTER — Ambulatory Visit (HOSPITAL_COMMUNITY): Payer: Medicare Other

## 2020-10-07 ENCOUNTER — Telehealth (HOSPITAL_COMMUNITY): Payer: Self-pay | Admitting: Licensed Clinical Social Worker

## 2020-10-07 ENCOUNTER — Other Ambulatory Visit (HOSPITAL_COMMUNITY): Payer: Medicare Other

## 2020-10-08 ENCOUNTER — Other Ambulatory Visit (HOSPITAL_COMMUNITY): Payer: Medicare Other

## 2020-10-08 ENCOUNTER — Other Ambulatory Visit: Payer: Self-pay

## 2020-10-08 ENCOUNTER — Other Ambulatory Visit (HOSPITAL_COMMUNITY): Payer: Medicare Other | Admitting: Licensed Clinical Social Worker

## 2020-10-08 DIAGNOSIS — F332 Major depressive disorder, recurrent severe without psychotic features: Secondary | ICD-10-CM

## 2020-10-08 DIAGNOSIS — F411 Generalized anxiety disorder: Secondary | ICD-10-CM

## 2020-10-08 MED ORDER — QUETIAPINE FUMARATE 25 MG PO TABS: 25.0000 mg | ORAL_TABLET | Freq: Two times a day (BID) | ORAL | 0 refills | Status: DC | PRN

## 2020-10-08 NOTE — Progress Notes (Signed)
Virtual Visit via Video Note  I connected with [name] on 10/08/20 at  9:00 AM EDT by a video enabled telemedicine application and verified that I am speaking with the correct person using two identifiers.  Location: Patient: Home  Provider: Office   I discussed the limitations of evaluation and management by telemedicine and the availability of in person appointments. The patient expressed understanding and agreed to proceed.   I discussed the assessment and treatment plan with the patient. The patient was provided an opportunity to ask questions and all were answered. The patient agreed with the plan and demonstrated an understanding of the instructions.   The patient was advised to call back or seek an in-person evaluation if the symptoms worsen or if the condition fails to improve as anticipated.  I provided 15  minutes of non-face-to-face time during this encounter.   Derrill Center, NP   BH MD/PA/NP OP Progress Note  10/08/2020 12:08 PM [name]  MRN:  [id number]  [name] reported " I only have 4 tablets left with Ativan." Stated feeling frustrated and overwhelm that she hasn't found a psychiatrist to refill her Ritalin and Ativan.   [name] reported declined in mood and worsening anxiety. Patient was offered to be restarted on antidepressant however she reported " treatment resistant."  NP has refilled Trazodone and initiated Seroquel  25 mg po BID. Patient was receptive to plan.She is denying suicidal or homicidal ideation. Rated her anxiety 8/10 ( 10 the best scale). Patient to follow-up with [facility] for outpatient assessment and follow-up per patient request. Stated " I keep forgetting to do that."  Denied auditory or visual hallucination. Support, encouragement and reassurance was provided.    Visit Diagnosis:    ICD-10-CM   1. Severe episode of recurrent major depressive disorder, without psychotic features ([place])  F33.2     2. 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 FFR of LAD  not performed because of difficulty with catheter control from the right radial.    Chronic cough 02/22/2020   Chronic kidney disease    hx of kidney cancer   Diastolic dysfunction, left ventricle 05/31/2013   Difficult intubation    told by [employer] that she was hard to intubate 15 yrs ago in [place]- surgery since then no problems   Dyspnea    Essential hypertension 05/31/2013   Generalized anxiety disorder    Adequate for discharge    GERD (gastroesophageal reflux disease)    History of attention deficit disorder    History of echocardiogram 07/2012   Normal LVF w grade I siastolic dysfunction    History of endometriosis    History of gall stones 12/31/2009   History of pleural effusion    Hyperlipidemia 06/24/2016   LDL goal <70   Hypertension    Hypothyroidism 1990   after partial thyroidectomy  for thyroid adenoma   Major depressive disorder    Migraine headaches    Obesity    Obstructive sleep apnea 02/20/2007   NPSG 2004:  AHI 10/hr Failed cpap trials.>dental appliance   Split night 06/2015 >Moderate obstructive sleep apnea occurred during this study  (AHI = 22.7/h).>rec CPAP >> poor compliance  HST 11/2016 AHI 12/h, 7h TST  03/2018 -office visit with Dr. Elsworth Soho discussed inspire device, patient would need to lose weight   PAT (paroxysmal atrial tachycardia)    s/p  ablation   PONV (postoperative nausea and vomiting)    PTSD (post-traumatic stress disorder)    Pure hypercholesterolemia 02/18/2019   PVC's (premature ventricular contractions)    Renal mass 02/15/2012   Restless legs syndrome (RLS) 02/20/2007   Vertigo     Past Surgical History:  Procedure Laterality Date   CARDIAC ELECTROPHYSIOLOGY MAPPING AND ABLATION  2000s   LAPAROSCOPIC NEPHRECTOMY, HAND ASSISTED Right 07/31/2019   Procedure: HAND ASSISTED LAPAROSCOPIC NEPHRECTOMY CONVERTED TO OPEN WITH REPAIR OF INFERIOR VENA CAVAOTOMY;  Surgeon: Robley Fries, MD;  Location: [facility];  Service: Urology;  Laterality: Right;  3 HRS   laparoscopy     LEFT HEART CATH AND CORONARY ANGIOGRAPHY N/A 06/08/2016   Procedure: Left Heart Cath and Coronary Angiography;  Surgeon: Belva Crome, MD;  Location: [facility];  Service: Cardiovascular;  Laterality: N/A;   nasoseptal reconstruction  1990s   NEPHRECTOMY RECIPIENT Right 08/2019   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   [place]     as a child   TUBAL LIGATION  1970s   uterine mass removal  03/2012   was found to be benign    Family Psychiatric History:   Family History:  Family History  Problem Relation Age of Onset   Allergies Father    Skin cancer Father    Alcohol abuse Father    Mental illness Father    Heart disease Mother    Depression Mother    Hypertension Mother    CAD Mother    Colon cancer Paternal Grandmother    OCD Paternal Grandmother    Multiple sclerosis Sister    Autoimmune disease Brother        unknown type   Allergies Daughter    Heart disease Maternal Grandfather    Cancer Paternal Grandfather    Parkinson's disease Paternal Grandfather    Mental illness Maternal Grandmother     Social History:  Social History   Socioeconomic History   Marital status: Single    Spouse name: Not on file   Number of children: 1   Years of education: 14   Highest education level: Some  college, no degree  Occupational History   Occupation: Retired  Tobacco Use   Smoking status: Never   Smokeless tobacco: Never  Scientific laboratory technician Use: Never used  Substance and Sexual Activity   Alcohol use: Not Currently    Alcohol/week: 1.0 standard drink    Types: 1 Glasses of wine per week   Drug use: No   Sexual activity: Yes    Partners: Male    Birth control/protection: None  Other Topics Concern   Not on file  Social History Narrative   Divorced and lives alone   Has children   Caffeine use: none   Right handed    Social Determinants of Health   Financial Resource Strain: Not on file  Food Insecurity: Not on file  Transportation Needs: Not on file  Physical Activity: Not on file  Stress: Not on file  Social Connections: Not on file    Allergies:  Allergies  Allergen Reactions   Geodon [Ziprasidone Hydrochloride] Other (See Comments)    Extremely agitated   Lithium Nausea Only and Other (See Comments)    Off balance, increased heart rate   Talwin [Pentazocine] Other (See Comments)    Hallucinations    Toradol [Ketorolac Tromethamine] Other (See Comments)    Chest pains   Ziprasidone Other (See Comments)    Extreme agitation   Abilify [Aripiprazole] Other (See Comments)    jerking   Compazine [Prochlorperazine Edisylate] Nausea And Vomiting   Latuda [Lurasidone Hcl] Other (See Comments)    Reports made her mind race more and made her irritable    Pristiq [Desvenlafaxine Succinate Er] Other (See Comments)    Did not work, prefers not to take   Rexulti [Brexpiprazole] Other (See Comments) and Hypertension    Elevated BP, created aggression   Diflucan [Fluconazole] Nausea And Vomiting    Metabolic Disorder Labs: Lab Results  Component Value Date   HGBA1C 5.5 05/26/2018   MPG 111.15 05/26/2018   Lab Results  Component Value Date   PROLACTIN 24.3 (H) 05/26/2018   Lab Results  Component Value Date   CHOL 177 05/26/2018   TRIG 68 05/26/2018    HDL 47 05/26/2018   CHOLHDL 3.8 05/26/2018   VLDL 14 05/26/2018   LDLCALC 116 (H) 05/26/2018   LDLCALC 102 (H) 04/21/2013   Lab Results  Component Value Date   TSH 1.485 09/04/2020   TSH 1.867 05/26/2018    Therapeutic Level Labs: No results found for: LITHIUM Lab Results  Component Value Date   VALPROATE 35 (L) 07/27/2018   VALPROATE 64 05/26/2018   No components found for:  CBMZ  Current Medications: Current Outpatient Medications  Medication Sig Dispense Refill   acetaminophen (TYLENOL) 500 MG tablet Take 500-1,000 mg by mouth every 6 (six) hours as needed for mild pain (or headaches).     albuterol (VENTOLIN HFA) 108 (90 Base) MCG/ACT inhaler Inhale 2 puffs into the lungs every 6 (six) hours as needed for wheezing or shortness of breath. (Patient not taking: Reported on 10/01/2020)     b complex vitamins tablet Take 1 tablet by mouth daily.     Cholecalciferol (VITAMIN D3) 125 MCG (5000 UT) CAPS Take 5,000 Units by mouth in the morning.     diphenhydrAMINE (BENADRYL) 25 mg capsule Take 25 mg by mouth every 6 (six) hours as needed for allergies.     DULoxetine (CYMBALTA) 60 MG capsule Take 60 mg by mouth See admin instructions. Take 60 mg by mouth once a day for neuropathy     gabapentin (NEURONTIN) 100 MG capsule Take 100 mg by mouth at bedtime as needed (for neuropathy).     ipratropium (ATROVENT) 0.06 % nasal spray Place 2 sprays into both nostrils in the morning.     levothyroxine (SYNTHROID) 88 MCG tablet Take 88 mcg by mouth daily before breakfast.     LORazepam (ATIVAN) 0.5 MG tablet TAKE 1 TABLET (0.5 MG TOTAL) BY MOUTH 3 (THREE) TIMES DAILY AS NEEDED FOR ANXIETY. (Patient taking differently: Take 1 mg by mouth 2 (two) times daily as needed for anxiety.) 90 tablet 0   losartan-hydrochlorothiazide (HYZAAR) 50-12.5 MG tablet Take 1 tablet by mouth 2 (two) times daily. 180 tablet 3   methylphenidate (RITALIN) 5 MG tablet Take 1 tablet (5 mg  total) by mouth 2 (two) times  daily with breakfast and lunch. 60 tablet 0   MINERAL ICE 2 % GEL Apply 1 application topically at bedtime as needed (to painful sites).     Omega 3 1000 MG CAPS Take 1,000 mg by mouth daily.     ondansetron (ZOFRAN-ODT) 8 MG disintegrating tablet Take 8 mg by mouth every 8 (eight) hours as needed for nausea or vomiting (dissolve orally).     polyethylene glycol powder (GLYCOLAX/MIRALAX) 17 GM/SCOOP powder Take 17 g by mouth See admin instructions. Mix 17 grams of powder into a fruit smoothie and drink every morning     QUEtiapine (SEROQUEL) 25 MG tablet Take 1 tablet (25 mg total) by mouth 3 times/day as needed-between meals & bedtime. 60 tablet 0   rOPINIRole (REQUIP) 0.5 MG tablet TAKE 1 TABLET BY MOUTH DAILY AFTER SUPPER & TAKE 2 TABLETS BY MOUTH AT BEDTIME 90 tablet 1   rosuvastatin (CRESTOR) 5 MG tablet Take 1 tablet (5 mg total) by mouth daily. 90 tablet 3   SALONPAS 3.03-27-08 % PTCH Apply 1 patch topically See admin instructions. Apply 1 patch to affected sites one to two times a day as needed for pain     traZODone (DESYREL) 100 MG tablet Take 1 tablet (100 mg total) by mouth at bedtime as needed for sleep. 30 tablet 0   vitamin C (ASCORBIC ACID) 500 MG tablet Take 500 mg by mouth daily.     No current facility-administered medications for this visit.     Musculoskeletal: Strength & Muscle Tone: within normal limits Gait & Station: normal Patient leans: N/A  Psychiatric Specialty Exam: Review of Systems  There were no vitals taken for this visit.There is no height or weight on file to calculate BMI.  General Appearance: Casual  Eye Contact:  Good  Speech:  Clear and Coherent  Volume:  Normal  Mood:  Angry and Depressed  Affect:  Congruent  Thought Process:  Coherent  Orientation:  Full (Time, Place, and Person)  Thought Content: Logical   Suicidal Thoughts:  No  Homicidal Thoughts:  No  Memory:  Immediate;   Fair Recent;   Fair  Judgement:  Fair  Insight:  Good   Psychomotor Activity:  Normal  Concentration:  Concentration: Good  Recall:  Good  Fund of Knowledge: Good  Language: Good  Akathisia:  No  Handed:  Right  AIMS (if indicated): done  Assets:  Communication Skills Resilience Social Support  ADL's:  Intact  Cognition: WNL  Sleep:  Good   Screenings: AIMS    Flowsheet Row Admission (Discharged) from OP Visit from 05/25/2018 in [facility]  AIMS Total Score 0      AUDIT    Flowsheet Row Admission (Discharged) from OP Visit from 05/25/2018 in [facility] Admission (Discharged) from 04/26/2013 in [facility] Admission (Discharged) from 06/29/2012 in [facility]  Alcohol Use Disorder Identification Test Final Score (AUDIT) 0 0 0      Mini-Mental    Flowsheet Row Office Visit from 01/26/2017 in [facility]  Total Score (max 30 points ) 28      PHQ2-9    Flowsheet Row Counselor from 10/01/2020 in [facility] ED from 09/04/2020 in [facility] Counselor from 08/06/2015 in [facility]  PHQ-2 Total Score 6 2 6   PHQ-9 Total Score 20  6 [place] Counselor from 10/01/2020 in [facility] ED from 09/22/2020 in [facility] ED from 09/04/2020 in [facility] Error: Q3, 4, or 5 should not be populated when Q2 is No High Risk Low Risk        Assessment and Plan:  Patient to continue partial hospitalization (PHP)  with plans to step down to Intensive Outpatients programming (IOP) outpatient programming. Patient to follow-up with [facility] screening  - Start Seroquel 25 mg po BID and 50 mg QHS. - D/C Trazodone   Treatment plan was reviewed and agreed upon by NP  T.Bobby Rumpf and patient [name] need for group services.   Derrill Center, NP 10/08/2020, 12:08 PM

## 2020-10-09 ENCOUNTER — Other Ambulatory Visit (HOSPITAL_COMMUNITY): Payer: Medicare Other | Admitting: Licensed Clinical Social Worker

## 2020-10-09 ENCOUNTER — Telehealth: Payer: Self-pay | Admitting: Cardiology

## 2020-10-09 ENCOUNTER — Other Ambulatory Visit: Payer: Self-pay

## 2020-10-09 ENCOUNTER — Encounter (HOSPITAL_COMMUNITY): Payer: Self-pay

## 2020-10-09 ENCOUNTER — Other Ambulatory Visit (HOSPITAL_COMMUNITY): Payer: Medicare Other | Admitting: Occupational Therapy

## 2020-10-09 ENCOUNTER — Telehealth (HOSPITAL_COMMUNITY): Payer: Self-pay | Admitting: Psychiatry

## 2020-10-09 DIAGNOSIS — Z888 Allergy status to other drugs, medicaments and biological substances status: Secondary | ICD-10-CM | POA: Diagnosis not present

## 2020-10-09 DIAGNOSIS — I2510 Atherosclerotic heart disease of native coronary artery without angina pectoris: Secondary | ICD-10-CM | POA: Diagnosis not present

## 2020-10-09 DIAGNOSIS — F332 Major depressive disorder, recurrent severe without psychotic features: Secondary | ICD-10-CM

## 2020-10-09 DIAGNOSIS — R4589 Other symptoms and signs involving emotional state: Secondary | ICD-10-CM | POA: Diagnosis not present

## 2020-10-09 DIAGNOSIS — G4733 Obstructive sleep apnea (adult) (pediatric): Secondary | ICD-10-CM | POA: Diagnosis not present

## 2020-10-09 DIAGNOSIS — K219 Gastro-esophageal reflux disease without esophagitis: Secondary | ICD-10-CM | POA: Diagnosis not present

## 2020-10-09 DIAGNOSIS — Z883 Allergy status to other anti-infective agents status: Secondary | ICD-10-CM | POA: Diagnosis not present

## 2020-10-09 DIAGNOSIS — E039 Hypothyroidism, unspecified: Secondary | ICD-10-CM | POA: Diagnosis not present

## 2020-10-09 DIAGNOSIS — Z85528 Personal history of other malignant neoplasm of kidney: Secondary | ICD-10-CM | POA: Diagnosis not present

## 2020-10-09 DIAGNOSIS — I129 Hypertensive chronic kidney disease with stage 1 through stage 4 chronic kidney disease, or unspecified chronic kidney disease: Secondary | ICD-10-CM | POA: Diagnosis not present

## 2020-10-09 DIAGNOSIS — F411 Generalized anxiety disorder: Secondary | ICD-10-CM

## 2020-10-09 DIAGNOSIS — Z905 Acquired absence of kidney: Secondary | ICD-10-CM | POA: Diagnosis not present

## 2020-10-09 DIAGNOSIS — Z79899 Other long term (current) drug therapy: Secondary | ICD-10-CM | POA: Diagnosis not present

## 2020-10-09 DIAGNOSIS — R41844 Frontal lobe and executive function deficit: Secondary | ICD-10-CM

## 2020-10-09 NOTE — Telephone Encounter (Signed)
D:  Pt will be transitioning from PHP to [facility] on 10-14-20.  A:  Placed call to re-orient pt, but there was no answer.  Left vm for pt to call case manager if she has any questions/concerns.

## 2020-10-09 NOTE — Therapy (Signed)
[facility] [place], [place], [address] Phone: [phone]   Fax:  [phone] Virtual Visit via Video Note  I connected with [name] on 10/09/20 at  11:00 AM EDT by a video enabled telemedicine application and verified that I am speaking with the correct person using two identifiers.  Location: Patient: Patient Home Provider: Clinic Office   I discussed the limitations of evaluation and management by telemedicine and the availability of in person appointments. The patient expressed understanding and agreed to proceed.   I discussed the assessment and treatment plan with the patient. The patient was provided an opportunity to ask questions and all were answered. The patient agreed with the plan and demonstrated an understanding of the instructions.   The patient was advised to call back or seek an in-person evaluation if the symptoms worsen or if the condition fails to improve as anticipated.  I provided 60 minutes of non-face-to-face time during this encounter.   Ponciano Ort, OT   Occupational Therapy Treatment  Patient Details  Name: [name] MRN: [id number] Date of Birth: 1945/06/04 Referring Provider (OT): Ricky Ala   Encounter Date: 10/09/2020   OT End of Session - 10/09/20 1257     Visit Number 5    Number of Visits 20    Date for OT Re-Evaluation 10/28/20    Authorization Type [employer]    OT Start Time 1100    OT Stop Time 1200    OT Time Calculation (min) 60 min    Activity Tolerance Patient tolerated treatment well    Behavior During Therapy [facility] for tasks assessed/performed             Past Medical History:  Diagnosis Date   Abscess of Bartholin's gland    Acquired hypothyroidism 1990   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 FFR of LAD  not performed because of difficulty with catheter control from the right radial.    Chronic cough 02/22/2020   Chronic kidney disease    hx of kidney cancer   Diastolic dysfunction, left ventricle 05/31/2013   Difficult intubation    told by [employer] that she was hard to intubate 15 yrs ago in [place]- surgery since then no problems   Dyspnea    Essential hypertension 05/31/2013   Generalized anxiety disorder    Adequate for discharge    GERD (gastroesophageal reflux disease)    History of attention deficit disorder    History of echocardiogram 07/2012   Normal LVF w grade I siastolic dysfunction    History of endometriosis    History of gall stones 12/31/2009   History of pleural effusion    Hyperlipidemia 06/24/2016   LDL goal <70   Hypertension    Hypothyroidism 1990   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 Elsworth Soho discussed inspire device, patient would need to lose weight   PAT (paroxysmal atrial tachycardia)    s/p ablation   PONV (postoperative nausea and vomiting)    PTSD (post-traumatic stress disorder)    Pure hypercholesterolemia 02/18/2019   PVC's (premature ventricular contractions)    Renal mass 02/15/2012   Restless legs syndrome (RLS) 02/20/2007   Vertigo     Past Surgical History:  Procedure Laterality Date   CARDIAC ELECTROPHYSIOLOGY  MAPPING AND ABLATION  2000s   LAPAROSCOPIC NEPHRECTOMY, HAND ASSISTED Right 07/31/2019   Procedure: HAND ASSISTED LAPAROSCOPIC NEPHRECTOMY CONVERTED TO OPEN WITH REPAIR OF INFERIOR VENA CAVAOTOMY;  Surgeon: Robley Fries, MD;  Location: [facility];  Service: Urology;  Laterality: Right;  3 HRS   laparoscopy     LEFT HEART CATH AND CORONARY ANGIOGRAPHY N/A 06/08/2016   Procedure: Left Heart Cath and Coronary Angiography;  Surgeon: Belva Crome, MD;  Location: [facility];  Service: Cardiovascular;  Laterality: N/A;   nasoseptal reconstruction  1990s   NEPHRECTOMY RECIPIENT Right 08/2019   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   Subjective Assessment - 10/09/20 1257     Currently in Pain? No/denies             OT Education - 10/09/20 1257     Education Details Educated on low vs positive self-esteem and reviewed six strategies to boost low self-esteem    Person(s) Educated Patient    Methods Explanation;Handout    Comprehension Verbalized understanding              OT Short Term Goals - 10/01/20 1459       OT SHORT TERM GOAL #1   Title Pt will actively engage in OT group sessions throughout duration of PHP programming, in order to promote daily structure, social engagement, and opportunities to develop and utilize adaptive strategies to maximize functional performance in preparation for safe transition and integration back into school, work, and the community.    Status On-going      OT SHORT TERM GOAL #2   Title Pt will practice and identify 1-3 adaptive coping strategies she can utilize, in order to safely manage increased depression/anxiety, with min cues, in preparation for safe and healthy reintegration back into the community at discharge.    Status On-going      OT SHORT TERM GOAL  #3   Title Pt will identify 1-3 stress management strategies she can utilize, in order to safely manage increased psychosocial stressors identified, with min cues, in preparation for safe transition back to the community at discharge.    Status On-going           Group Session:  S: "My thoughts are up and down all the time, but I recently realized that my self-esteem and worth is for me and no one else. That's helped a lot."  O: Today's group session encouraged increased engagement and participation through discussion focused on self-esteem. Topic of discussion focused on identifying differences between low vs high self-esteem and identified ways in which our self-esteem impacts our daily lives  including: self-criticism, ignoring positive qualities, negative emotions, work/school performance, relationships, leisure engagement, and self-care. Group members further identified ways in which their self-esteem directly impacts their daily function. Tips and strategies were shared to boost and build-up our low self-esteem, including exercise, mindfulness/meditation, postures of confidence, surrounding yourself with positive people, positive affirmations, and self-care. Group members committed to trying one of the above-mentioned strategies over their weekend.    A: [name] was active and independent in her participation of discussion and shared that she has had low self-esteem her entire life, and only recently recognized that she needs to live for herself and no one else. She also shared that she was closest to her grandmother and her grandma would always tell her she was "lovely." Pt receptive to strategies reviewed and shared a positive affirmation that stood out to her "I forgive myself for my mistakes". Pt also shared three positive ways to describe herself "intelligent, honest, and fun to be with"   P: Continue to attend PHP OT group sessions 5x week for 1 weeks to promote daily structure, social  engagement, and opportunities to develop and utilize adaptive strategies to maximize functional performance in preparation for safe transition and integration back into school, work, and the community. Plan to address topic of strengths exploration in next OT group session.  Plan - 10/09/20 1257     Occupational performance deficits (Please refer to evaluation for details): ADL's;IADL's;Rest and Sleep;Education;Work;Leisure;Social Participation    Body Structure / Function / Physical Skills ADL    Cognitive Skills Attention;Emotional;Energy/Drive;Learn;Memory;Perception;Problem Solve;Safety Awareness;Temperament/Personality;Thought;Understand    Psychosocial Skills Coping Strategies;Environmental  Adaptations;Habits;Interpersonal Interaction;Routines and Behaviors             Patient will benefit from skilled therapeutic intervention in order to improve the following deficits and impairments:   Body Structure / Function / Physical Skills: ADL Cognitive Skills: Attention, Emotional, Energy/Drive, Learn, Memory, Perception, Problem Solve, Safety Awareness, Temperament/Personality, Thought, Understand Psychosocial Skills: Coping Strategies, Environmental  Adaptations, Habits, Interpersonal Interaction, Routines and Behaviors   Visit Diagnosis: Difficulty coping  Frontal lobe and executive function deficit  Severe episode of recurrent major depressive disorder, without psychotic features ([place])    Problem List Patient Active Problem List   Diagnosis Date Noted   Chronic cough 02/22/2020   Bursitis of right shoulder 05/24/2019   Pure hypercholesterolemia 02/18/2019   PONV (postoperative nausea and vomiting)    Migraine headaches    History of pleural effusion    History of endometriosis    GERD (gastroesophageal reflux disease)    Chronic kidney disease    CAD (coronary artery disease)    Acute vestibular neuronitis    Abscess of Bartholin's gland    ADHD (attention deficit  hyperactivity disorder)    Vertigo 01/26/2017   Hyperlipidemia LDL goal <70 06/24/2016   PTSD (post-traumatic stress disorder)    Major depressive disorder    PVC's (premature ventricular contractions) 71/08/2692   Diastolic dysfunction, left ventricle 05/31/2013   PAT (paroxysmal atrial tachycardia) (HCC) 05/31/2013   Essential hypertension 05/31/2013   Obesity 05/31/2013   Generalized anxiety disorder    Renal mass 02/15/2012   History of gall stones 12/31/2009   Obstructive sleep apnea 02/20/2007   Restless legs syndrome (RLS) 02/20/2007   Acquired hypothyroidism 03/22/1988    10/09/2020  Ponciano Ort, MOT, OTR/L 10/09/2020, 12:58 PM  [facility] [place], [place], [address] Phone: [phone]   Fax:  [phone]  Name: [name]  MRN: [id number] Date of Birth: 01-Dec-1945

## 2020-10-09 NOTE — Telephone Encounter (Signed)
Left message for pt to call.

## 2020-10-09 NOTE — Telephone Encounter (Signed)
Patient c/o Palpitations:  High priority if patient c/o lightheadedness, shortness of breath, or chest pain  How long have you had palpitations/irregular HR/ Afib? Are you having the symptoms now? Several weeks; yes   Are you currently experiencing lightheadedness, SOB or CP? Lightheadedness, SOB, headaches  Do you have a history of afib (atrial fibrillation) or irregular heart rhythm? No   Have you checked your BP or HR? (document readings if available): 197/109; 109 176/92; 90  Are you experiencing any other symptoms? Lightheadedness, SOB, headaches

## 2020-10-10 ENCOUNTER — Other Ambulatory Visit: Payer: Self-pay

## 2020-10-10 ENCOUNTER — Other Ambulatory Visit (HOSPITAL_COMMUNITY): Payer: Medicare Other | Admitting: Occupational Therapy

## 2020-10-10 ENCOUNTER — Encounter (HOSPITAL_COMMUNITY): Payer: Self-pay

## 2020-10-10 ENCOUNTER — Other Ambulatory Visit (HOSPITAL_COMMUNITY): Payer: Medicare Other | Admitting: Licensed Clinical Social Worker

## 2020-10-10 DIAGNOSIS — Z888 Allergy status to other drugs, medicaments and biological substances status: Secondary | ICD-10-CM | POA: Diagnosis not present

## 2020-10-10 DIAGNOSIS — Z905 Acquired absence of kidney: Secondary | ICD-10-CM | POA: Diagnosis not present

## 2020-10-10 DIAGNOSIS — F332 Major depressive disorder, recurrent severe without psychotic features: Secondary | ICD-10-CM

## 2020-10-10 DIAGNOSIS — I129 Hypertensive chronic kidney disease with stage 1 through stage 4 chronic kidney disease, or unspecified chronic kidney disease: Secondary | ICD-10-CM | POA: Diagnosis not present

## 2020-10-10 DIAGNOSIS — F411 Generalized anxiety disorder: Secondary | ICD-10-CM

## 2020-10-10 DIAGNOSIS — R4589 Other symptoms and signs involving emotional state: Secondary | ICD-10-CM

## 2020-10-10 DIAGNOSIS — Z883 Allergy status to other anti-infective agents status: Secondary | ICD-10-CM | POA: Diagnosis not present

## 2020-10-10 DIAGNOSIS — G4733 Obstructive sleep apnea (adult) (pediatric): Secondary | ICD-10-CM | POA: Diagnosis not present

## 2020-10-10 DIAGNOSIS — I2510 Atherosclerotic heart disease of native coronary artery without angina pectoris: Secondary | ICD-10-CM | POA: Diagnosis not present

## 2020-10-10 DIAGNOSIS — Z85528 Personal history of other malignant neoplasm of kidney: Secondary | ICD-10-CM | POA: Diagnosis not present

## 2020-10-10 DIAGNOSIS — E039 Hypothyroidism, unspecified: Secondary | ICD-10-CM | POA: Diagnosis not present

## 2020-10-10 DIAGNOSIS — Z79899 Other long term (current) drug therapy: Secondary | ICD-10-CM | POA: Diagnosis not present

## 2020-10-10 DIAGNOSIS — R41844 Frontal lobe and executive function deficit: Secondary | ICD-10-CM

## 2020-10-10 DIAGNOSIS — K219 Gastro-esophageal reflux disease without esophagitis: Secondary | ICD-10-CM | POA: Diagnosis not present

## 2020-10-10 NOTE — Therapy (Signed)
[facility] [place], [place], [address] Phone: [phone]   Fax:  [phone] Virtual Visit via Video Note  I connected with [name] on 10/10/20 at  11:00 AM EDT by a video enabled telemedicine application and verified that I am speaking with the correct person using two identifiers.  Location: Patient: Patient Home Provider: Clinic Office    I discussed the limitations of evaluation and management by telemedicine and the availability of in person appointments. The patient expressed understanding and agreed to proceed.   I discussed the assessment and treatment plan with the patient. The patient was provided an opportunity to ask questions and all were answered. The patient agreed with the plan and demonstrated an understanding of the instructions.   The patient was advised to call back or seek an in-person evaluation if the symptoms worsen or if the condition fails to improve as anticipated.  I provided 65 minutes of non-face-to-face time during this encounter.   Ponciano Ort, OT   Occupational Therapy Treatment  Patient Details  Name: [name] MRN: [id number] Date of Birth: 1945-07-05 Referring Provider (OT): Ricky Ala   Encounter Date: 10/10/2020   OT End of Session - 10/10/20 1412     Visit Number 6    Number of Visits 20    Date for OT Re-Evaluation 10/28/20    Authorization Type [employer]    OT Start Time 1110    OT Stop Time 1215    OT Time Calculation (min) 65 min    Activity Tolerance Patient tolerated treatment well    Behavior During Therapy [facility] for tasks assessed/performed             Past Medical History:  Diagnosis Date   Abscess of Bartholin's gland    Acquired hypothyroidism 1990   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 FFR of LAD  not performed because of difficulty with catheter control from the right radial.    Chronic cough 02/22/2020   Chronic kidney disease    hx of kidney cancer   Diastolic dysfunction, left ventricle 05/31/2013   Difficult intubation    told by [facility] that she was hard to intubate 15 yrs ago in [place]- surgery since then no problems   Dyspnea    Essential hypertension 05/31/2013   Generalized anxiety disorder    Adequate for discharge    GERD (gastroesophageal reflux disease)    History of attention deficit disorder    History of echocardiogram 07/2012   Normal LVF w grade I siastolic dysfunction    History of endometriosis    History of gall stones 12/31/2009   History of pleural effusion    Hyperlipidemia 06/24/2016   LDL goal <70   Hypertension    Hypothyroidism 1990   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 Elsworth Soho discussed inspire device, patient would need to lose weight   PAT (paroxysmal atrial tachycardia)    s/p ablation   PONV (postoperative nausea and vomiting)    PTSD (post-traumatic stress disorder)    Pure hypercholesterolemia 02/18/2019   PVC's (premature ventricular contractions)    Renal mass 02/15/2012   Restless legs syndrome (RLS) 02/20/2007   Vertigo     Past Surgical History:  Procedure Laterality Date   CARDIAC ELECTROPHYSIOLOGY  MAPPING AND ABLATION  2000s   LAPAROSCOPIC NEPHRECTOMY, HAND ASSISTED Right 07/31/2019   Procedure: HAND ASSISTED LAPAROSCOPIC NEPHRECTOMY CONVERTED TO OPEN WITH REPAIR OF INFERIOR VENA CAVAOTOMY;  Surgeon: Robley Fries, MD;  Location: [facility];  Service: Urology;  Laterality: Right;  3 HRS   laparoscopy     LEFT HEART CATH AND CORONARY ANGIOGRAPHY N/A 06/08/2016   Procedure: Left Heart Cath and Coronary Angiography;  Surgeon: Belva Crome, MD;  Location: [facility];  Service: Cardiovascular;  Laterality: N/A;   nasoseptal reconstruction  1990s   NEPHRECTOMY RECIPIENT Right 08/2019   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   [place]     as a child   TUBAL LIGATION  1970s   uterine mass removal  03/2012   was found to be benign    There were no vitals filed for this visit.   Subjective Assessment - 10/10/20 1410     Currently in Pain? No/denies             OT Education - 10/10/20 1410     Education Details Educated on personal strengths/exploration and brainstormed ways in which we could utilize our strengths as a strategy to manage our mental health, in regards to personal fulfillment, professional life, and within our relationships.    Person(s) Educated Patient    Methods Explanation;Handout    Comprehension Verbalized understanding              OT Short Term Goals - 10/10/20 1415       OT SHORT TERM GOAL #1   Title Pt will actively engage in OT group sessions throughout duration of PHP programming, in order to promote daily structure, social engagement, and opportunities to develop and utilize adaptive strategies to maximize functional performance in preparation for safe transition and integration back into school, work, and the community.    Status Achieved      OT SHORT TERM GOAL #2   Title Pt will practice and identify 1-3 adaptive coping strategies she can utilize, in order to safely manage increased depression/anxiety, with min  cues, in preparation for safe and healthy reintegration back into the community at discharge.    Status Achieved      OT SHORT TERM GOAL #3   Title Pt will identify 1-3 stress management strategies she can utilize, in order to safely manage increased psychosocial stressors identified, with min cues, in preparation for safe transition back to the community at discharge.    Status Achieved           Group Session:  S: "I don't get to talk about my strengths a lot, but I have a lot more than I thought I did."  O: Today's group session focused on topic of strength and explored personal strengths an individual possesses. Group members were given a strengths exploration handout and  identified from the list what strengths they currently have and why they are considered a strength. Discussion focused on identifying how we can use our strengths to manage our mental health, while also exploring use of our strengths for our personal fulfillment, professional life, and in relationships.    A: [name] was active in her participation of discussion and activity, sharing that she did not realize how many strengths she currently possessed and shared them in group. Pt identified her strengths when it comes to relationships "flexibility, intelligence, honesty, curiousity", strengths in her profession "optimism, flexibility, assertiveness, intelligence", and strengths in her personal fulfillment "love, artistic ability, humor, spirituality". Pt also shared the strength she would like to gain "social awareness". Receptive to discussion and support provided.   P: Pt will discharge from PHP effective today 10/10/20, with plan to follow up and begin [place] on Tuesday 10/14/20  [place] SUMMARY  Visits from Start of Care: 6  Current functional level related to goals / functional outcomes: Pt has achieved all of her OT goals within the [facility] program. Pt is agreeable to discharge from OT, effective  10/10/20 with plan to follow up and begin [place] 10/14/20   Remaining deficits: See above   Education / Equipment: See above   Patient agrees to discharge. Patient goals were met. Patient is being discharged due to meeting the stated rehab goals..       Plan - 10/10/20 1413     Occupational performance deficits (Please refer to evaluation for details): ADL's;IADL's;Rest and Sleep;Education;Work;Leisure;Social Participation    Body Structure / Function / Physical Skills ADL    Cognitive Skills Attention;Emotional;Energy/Drive;Learn;Memory;Perception;Problem Solve;Safety Awareness;Temperament/Personality;Thought;Understand    Psychosocial Skills Coping Strategies;Environmental  Adaptations;Habits;Interpersonal Interaction;Routines and Behaviors             Patient will benefit from skilled therapeutic intervention in order to improve the following deficits and impairments:   Body Structure / Function / Physical Skills: ADL Cognitive Skills: Attention, Emotional, Energy/Drive, Learn, Memory, Perception, Problem Solve, Safety Awareness, Temperament/Personality, Thought, Understand Psychosocial Skills: Coping Strategies, Environmental  Adaptations, Habits, Interpersonal Interaction, Routines and Behaviors   Visit Diagnosis: Difficulty coping  Frontal lobe and executive function deficit  Severe episode of recurrent major depressive disorder, without psychotic features ([place])    Problem List Patient Active Problem List   Diagnosis Date Noted   Chronic cough 02/22/2020   Bursitis of right shoulder 05/24/2019   Pure hypercholesterolemia 02/18/2019   PONV (postoperative nausea and vomiting)    Migraine headaches    History of pleural effusion    History of endometriosis    GERD (gastroesophageal reflux disease)    Chronic kidney disease    CAD (coronary artery disease)    Acute vestibular neuronitis    Abscess of Bartholin's gland    ADHD (attention deficit hyperactivity  disorder)    Vertigo 01/26/2017   Hyperlipidemia LDL goal <70 06/24/2016   PTSD (post-traumatic stress disorder)    Major depressive disorder    PVC's (premature ventricular contractions) 12/87/8676   Diastolic dysfunction, left ventricle 05/31/2013   PAT (paroxysmal atrial tachycardia) (HCC) 05/31/2013   Essential hypertension 05/31/2013   Obesity 05/31/2013   Generalized anxiety disorder    Renal mass 02/15/2012   History of gall stones 12/31/2009   Obstructive sleep apnea 02/20/2007   Restless legs syndrome (RLS) 02/20/2007   Acquired hypothyroidism 03/22/1988    10/10/2020  Ponciano Ort, MOT, OTR/L 10/10/2020, 2:19 PM  [facility] [place]  [place], [place], [address] Phone: [phone]   Fax:  [phone]  Name: [name] MRN: [id number] Date of Birth: April 07, 1945

## 2020-10-10 NOTE — Telephone Encounter (Signed)
Spoke with pt, she is anxious and tearful on the phone. For about 2 weeks now she has had skipping and fast heart rates. She was seen in the ER recently for PVC. She reports pounding and her bp machine is reporting irregular pulse. She reports the palpitations last about 10 to 15 min. She is aware of normal stress test results, she does report returning the monitor but did not wear it very long, it irritated her skin. By the end of the call she had calmed down but was still tearful. Follow up scheduled with dr Harrell Gave. Relaxation technique and simulators that can cause palpitations discussed with the patient.

## 2020-10-13 ENCOUNTER — Other Ambulatory Visit (HOSPITAL_COMMUNITY): Payer: Medicare Other

## 2020-10-13 ENCOUNTER — Encounter (HOSPITAL_COMMUNITY): Payer: Self-pay | Admitting: Family

## 2020-10-13 ENCOUNTER — Other Ambulatory Visit (HOSPITAL_COMMUNITY): Payer: Medicare Other | Admitting: Licensed Clinical Social Worker

## 2020-10-13 ENCOUNTER — Other Ambulatory Visit: Payer: Self-pay

## 2020-10-13 ENCOUNTER — Telehealth (HOSPITAL_COMMUNITY): Payer: Self-pay

## 2020-10-13 DIAGNOSIS — E039 Hypothyroidism, unspecified: Secondary | ICD-10-CM | POA: Diagnosis not present

## 2020-10-13 DIAGNOSIS — I2510 Atherosclerotic heart disease of native coronary artery without angina pectoris: Secondary | ICD-10-CM | POA: Diagnosis not present

## 2020-10-13 DIAGNOSIS — I129 Hypertensive chronic kidney disease with stage 1 through stage 4 chronic kidney disease, or unspecified chronic kidney disease: Secondary | ICD-10-CM | POA: Diagnosis not present

## 2020-10-13 DIAGNOSIS — Z905 Acquired absence of kidney: Secondary | ICD-10-CM | POA: Diagnosis not present

## 2020-10-13 DIAGNOSIS — Z79899 Other long term (current) drug therapy: Secondary | ICD-10-CM | POA: Diagnosis not present

## 2020-10-13 DIAGNOSIS — F332 Major depressive disorder, recurrent severe without psychotic features: Secondary | ICD-10-CM

## 2020-10-13 DIAGNOSIS — F4310 Post-traumatic stress disorder, unspecified: Secondary | ICD-10-CM

## 2020-10-13 DIAGNOSIS — Z888 Allergy status to other drugs, medicaments and biological substances status: Secondary | ICD-10-CM | POA: Diagnosis not present

## 2020-10-13 DIAGNOSIS — G4733 Obstructive sleep apnea (adult) (pediatric): Secondary | ICD-10-CM | POA: Diagnosis not present

## 2020-10-13 DIAGNOSIS — Z85528 Personal history of other malignant neoplasm of kidney: Secondary | ICD-10-CM | POA: Diagnosis not present

## 2020-10-13 DIAGNOSIS — Z883 Allergy status to other anti-infective agents status: Secondary | ICD-10-CM | POA: Diagnosis not present

## 2020-10-13 DIAGNOSIS — F3130 Bipolar disorder, current episode depressed, mild or moderate severity, unspecified: Secondary | ICD-10-CM

## 2020-10-13 DIAGNOSIS — K219 Gastro-esophageal reflux disease without esophagitis: Secondary | ICD-10-CM | POA: Diagnosis not present

## 2020-10-13 MED ORDER — LORAZEPAM 0.5 MG PO TABS: 0.5000 mg | ORAL_TABLET | Freq: Every day | ORAL | 0 refills | Status: DC | PRN

## 2020-10-13 MED ORDER — BUSPIRONE HCL 5 MG PO TABS: 5.0000 mg | ORAL_TABLET | Freq: Two times a day (BID) | ORAL | 0 refills | Status: DC

## 2020-10-13 NOTE — Progress Notes (Signed)
Virtual Visit via Video Note  I connected with [name] on 10/13/20 at  9:00 AM EDT by a video enabled telemedicine application and verified that I am speaking with the correct person using two identifiers.  Location: Patient: Home  Provider:  Office   I discussed the limitations of evaluation and management by telemedicine and the availability of in person appointments. The patient expressed understanding and agreed to proceed.    I discussed the assessment and treatment plan with the patient. The patient was provided an opportunity to ask questions and all were answered. The patient agreed with the plan and demonstrated an understanding of the instructions.   The patient was advised to call back or seek an in-person evaluation if the symptoms worsen or if the condition fails to improve as anticipated.  I provided  15 minutes of non-face-to-face time during this encounter.   Derrill Center, NP  Virtual Visit via Video Note  I connected with [name] on 10/13/20 at  9:00 AM EDT by a video enabled telemedicine application and verified that I am speaking with the correct person using two identifiers.  Location: Patient: Home   Provider: Office   I discussed the limitations of evaluation and management by telemedicine and the availability of in person appointments. The patient expressed understanding and agreed to proceed.    I discussed the assessment and treatment plan with the patient. The patient was provided an opportunity to ask questions and all were answered. The patient agreed with the plan and demonstrated an understanding of the instructions.   The patient was advised to call back or seek an in-person evaluation if the symptoms worsen or if the condition fails to improve as anticipated.  I provided 15 minutes of non-face-to-face time during this encounter.   Derrill Center, NP   [facility] Partial Hospitalization Outpatient  Program Discharge Summary  [name] [id number]  Admission date: 09/30/2020 Discharge date: 10/13/2020  Reason for admission: [name]  is a 75 y.o. Caucasian female presents with depression and anxiety.  Patient was recently seen and evaluated at the local emergency department due to worsening depression.  States she was followed by psychiatry in the past however has not been on medications for quite some time.  States she is interested in starting [place] because she feels as if she is treatment resistant to psychotropic medications.  Patient was enrolled in partial psychiatric program on 09/30/20.   Progress in Program Toward Treatment Goals: Ongoing, patient attended participated with daily group session with active and engaged participation.  Denying suicidal homicidal ideations.  Continues to report worsening depression and anxiety.  Patient continues to ask for refills with Ativan and Ritalin to help with her mood.  Patient was initiated on Seroquel 25 mg p.o. twice daily and 50 mg p.o. nightly however reports worsening aggression and mood irritability while taking medication.  Discussed courtesy refill of Ativan 0.5 x 30 days no refills and patient to start BuSpar 5 mg p.o. twice daily.  Patient was receptive to plan.  Patient to follow-up with outpatient providers for additional medication refills.   Progress (rationale): Step down to IOP  Take all medications as prescribed. Keep all follow-up appointments as scheduled.  Do not consume alcohol or use illegal drugs while on prescription medications. Report any adverse effects from your medications to your primary care provider promptly.  In the event of recurrent symptoms or worsening symptoms, call 911, a crisis hotline, or go  to the nearest emergency department for evaluation.    Derrill Center, NP 10/13/2020

## 2020-10-13 NOTE — Telephone Encounter (Signed)
Pt returned call regarding a [facility] referral. Pt appears to be a candidate for [place]. [facility] coordinator will review pt's chart and schedule her for a consult.

## 2020-10-14 ENCOUNTER — Other Ambulatory Visit: Payer: Self-pay

## 2020-10-14 ENCOUNTER — Other Ambulatory Visit (HOSPITAL_COMMUNITY): Payer: Medicare Other

## 2020-10-14 ENCOUNTER — Ambulatory Visit (HOSPITAL_COMMUNITY): Payer: Medicare Other

## 2020-10-14 ENCOUNTER — Other Ambulatory Visit (HOSPITAL_COMMUNITY): Payer: Medicare Other | Admitting: Psychiatry

## 2020-10-14 ENCOUNTER — Encounter (HOSPITAL_COMMUNITY): Payer: Self-pay | Admitting: Psychiatry

## 2020-10-14 DIAGNOSIS — R4589 Other symptoms and signs involving emotional state: Secondary | ICD-10-CM

## 2020-10-14 DIAGNOSIS — F332 Major depressive disorder, recurrent severe without psychotic features: Secondary | ICD-10-CM

## 2020-10-14 NOTE — Psych (Signed)
Virtual Visit via Video Note  I connected with [name] on 09/30/20 at  9:00 AM EDT by a video enabled telemedicine application and verified that I am speaking with the correct person using two identifiers.  Location: Patient: patient home Provider: clinical home office   I discussed the limitations of evaluation and management by telemedicine and the availability of in person appointments. The patient expressed understanding and agreed to proceed.  I discussed the assessment and treatment plan with the patient. The patient was provided an opportunity to ask questions and all were answered. The patient agreed with the plan and demonstrated an understanding of the instructions.   The patient was advised to call back or seek an in-person evaluation if the symptoms worsen or if the condition fails to improve as anticipated.  Pt was provided 240 minutes of non-face-to-face time during this encounter.   Lorin Glass, LCSW    [facility] PHP THERAPIST PROGRESS NOTE  [name]  Session Time: 9:00 - 10:00  Participation Level: Active  Behavioral Response: CasualAlertAnxious  Type of Therapy: Group Therapy  Treatment Goals addressed: Coping  Interventions: CBT, DBT, Supportive, and Reframing  Summary: Clinician led check-in regarding current stressors and situation. Clinician utilized active listening and empathetic response and validated patient emotions. Clinician facilitated processing group on pertinent issues.   Therapist Response: [name] is a 75 y.o. female who presents with depression and mood dysregulation symptoms. Patient arrived within time allowed and reports that she is feeling "happy to be in group." Patient rates her mood at a 6 on a scale of 1-10 with 10 being great. Pt reports increased anxiety over the evening and having a panic attack regarding going to the grocery store. Pt states she did not sleep well and it was broken. Pt  reports hopelessness. Pt able to process. Pt engaged in discussion. Pt is labile throughout session.          Session Time: 10:00 - 11:00   Participation Level: Active   Behavioral Response: CasualAlertDepressed   Type of Therapy: Group Therapy   Treatment Goals addressed: Coping   Interventions: CBT, DBT, Supportive and Reframing   Summary: Cln led discussion on anxious behaviors. Group members shared patterns of anxiety they experience. Cln encouraged pt's to increase insight into the roots of their anxious behaviors to be able to address triggers.      Therapist Response: Pt engaged in discussion and reports increased insight into triggers.          Session Time: 11:00- 12:00   Participation Level: Active   Behavioral Response: CasualAlertDepressed   Type of Therapy: Group Therapy, OT   Treatment Goals addressed: Coping   Interventions: Psychosocial skills training, Supportive   Summary: Occupational Therapy group   Therapist Response: Patient engaged in group. See OT note.           Session Time: 12:00 -1:00   Participation Level: Active   Behavioral Response: CasualAlertDepressed   Type of Therapy: Group therapy   Treatment Goals addressed: Coping   Interventions: CBT; Solution focused; Supportive; Reframing   Summary: 12:00 - 12:50: Cln continued topic of boundaries and introduced how to set and maintain healthy boundaries. Cln utilized handout "how to set boundaries" and group members worked through examples to practice setting appropriate boundaries.  12:50 -1:00 Clinician led check-out. Clinician assessed for immediate needs, medication compliance and efficacy, and safety concerns   Therapist Response: 12:50 - 1:00: Pt engaged in discussion and reports  struggle with setting and maintaining boundaries.  12:50 - 1:00: At check-out, patient rates her mood at a 10 on a scale of 1-10 with 10 being great. Pt reports afternoon plans of making a pie. Pt  demonstrates some progress as evidenced by participating in first group session. Patient denies SI/HI at the end of group.   Suicidal/Homicidal: Nowithout intent/plan  Plan: Pt will continue in PHP while working to decrease depression and anxiety symptoms, increase emotion regulation, and increase ability to manage symptoms in a healthy manner.   Diagnosis: Severe episode of recurrent major depressive disorder, without psychotic features ([place]) [F33.2]    1. Severe episode of recurrent major depressive disorder, without psychotic features ([place])   2. GAD (generalized anxiety disorder)       Lorin Glass, LCSW 10/14/2020
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
## 2020-10-14 NOTE — Progress Notes (Signed)
Psychiatric Initial Adult Assessment   Patient Identification: [name] MRN:  [id number] Date of Evaluation:  10/14/2020 Referral Source: PHP  Chief Complaint:   Chief Complaint   Anxiety; Depression; Stress    Visit Diagnosis:    ICD-10-CM   1. Severe episode of recurrent major depressive disorder, without psychotic features ([place])  F33.2     2. Difficulty coping  R45.89       History of Present Illness:   [name] recently completed partial hospitalization.  Patient reports concerns with worsening depression and anxiety.  She was initiated on Seroquel 25 mg p.o. twice daily as needed for mood stabilization which she reports was ineffective.  She states that the Seroquel caused her to have worsening rage and anxiety.  Discussed initiating BuSpar 5 mg p.o. twice daily in a one-time order Ativan 0.5 mg this provider will not provide any additional refills.  Ativan or Ritlan.  Patient to follow-up with [facility] per her request.  Patient to start intensive outpatient programming on 10/14/2020   Per admission assessment note: [name]  is a 75 y.o. Caucasian female presents with depression and anxiety.  Patient was recently seen and evaluated at the local emergency department due to worsening depression.  States she was followed by psychiatry in the past however has not been on medications for quite some time.  States she is interested in starting [facility] because she feels as if she is treatment resistant to psychotropic medications.  Patient was enrolled in partial psychiatric program on 09/30/20.  Associated Signs/Symptoms: Depression Symptoms:  depressed mood, feelings of worthlessness/guilt, difficulty concentrating, anxiety, (Hypo) Manic Symptoms:  Distractibility, Irritable Mood, Labiality of Mood, Anxiety Symptoms:  Excessive Worry, Psychotic Symptoms:   N/A PTSD Symptoms: NA  Past Psychiatric History:  Patient was previously followed by psychiatry for depression/  anxiety and bipolar disorder. Denied that diginose is accurate.   Previous Psychotropic Medications: No   Substance Abuse History in the last 12 months:  No.  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 FFR of LAD  not performed because of difficulty with catheter control from the right radial.    Chronic cough 02/22/2020   Chronic kidney disease    hx of kidney cancer   Diastolic dysfunction, left ventricle 05/31/2013   Difficult intubation    told by [facility] that she was hard to intubate 15 yrs ago in [place]- surgery since then no problems   Dyspnea    Essential hypertension 05/31/2013   Generalized anxiety disorder    Adequate for discharge    GERD (gastroesophageal reflux disease)    History of attention deficit disorder    History of echocardiogram 07/2012   Normal LVF w grade I siastolic dysfunction    History of endometriosis    History of gall stones 12/31/2009   History of pleural effusion    Hyperlipidemia 06/24/2016   LDL goal <70   Hypertension  Hypothyroidism 1990   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 Elsworth Soho discussed inspire device, patient would need to lose weight   PAT (paroxysmal atrial tachycardia)    s/p ablation   PONV (postoperative nausea and vomiting)    PTSD (post-traumatic stress disorder)    Pure hypercholesterolemia 02/18/2019   PVC's (premature ventricular contractions)    Renal mass 02/15/2012   Restless legs syndrome (RLS) 02/20/2007   Vertigo     Past Surgical History:  Procedure Laterality Date   CARDIAC ELECTROPHYSIOLOGY MAPPING AND ABLATION  2000s   LAPAROSCOPIC NEPHRECTOMY, HAND ASSISTED Right 07/31/2019   Procedure: HAND ASSISTED LAPAROSCOPIC NEPHRECTOMY CONVERTED TO OPEN WITH REPAIR OF INFERIOR VENA CAVAOTOMY;  Surgeon: Robley Fries, MD;  Location: [facility];  Service: Urology;  Laterality: Right;  3 HRS   laparoscopy     LEFT HEART CATH AND CORONARY ANGIOGRAPHY N/A 06/08/2016   Procedure: Left Heart Cath and Coronary Angiography;  Surgeon: Belva Crome, MD;  Location: [facility];  Service: Cardiovascular;  Laterality: N/A;   nasoseptal reconstruction  1990s   NEPHRECTOMY RECIPIENT Right 08/2019   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   [place]     as a child   TUBAL LIGATION  1970s   uterine mass removal  03/2012   was found to be benign    Family Psychiatric History:   Family History:  Family History  Problem Relation Age of Onset   Allergies Father    Skin cancer Father    Alcohol abuse Father    Mental illness Father    Heart disease Mother    Depression Mother    Hypertension Mother    CAD Mother    Colon cancer Paternal Grandmother    OCD Paternal Grandmother    Multiple sclerosis Sister    Autoimmune disease Brother        unknown type   Allergies Daughter    Heart disease Maternal Grandfather    Cancer Paternal Grandfather    Parkinson's disease Paternal Grandfather    Mental illness Maternal Grandmother     Social History:   Social History   Socioeconomic  History   Marital status: Single    Spouse name: Not on file   Number of children: 1   Years of education: 14   Highest education level: Some college, no degree  Occupational History   Occupation: Retired  Tobacco Use   Smoking status: Never   Smokeless tobacco: Never  Scientific laboratory technician Use: Never used  Substance and Sexual Activity   Alcohol use: Not Currently    Alcohol/week: 1.0 standard drink    Types: 1 Glasses of wine per week   Drug use: No   Sexual activity: Yes    Partners: Male    Birth control/protection: None  Other Topics Concern   Not on file  Social History Narrative   Divorced and lives alone   Has children   Caffeine use: none   Right handed    Social Determinants of Health   Financial Resource Strain: Not on  file  Food Insecurity: Not on file  Transportation Needs: Not on file  Physical Activity: Not on file  Stress: Not on file  Social Connections: Not on file    Additional Social History:   Allergies:   Allergies  Allergen Reactions   Geodon [Ziprasidone Hydrochloride] Other (See Comments)    Extremely agitated   Lithium Nausea Only and Other (See Comments)    Off balance, increased heart rate   Talwin [Pentazocine] Other (See Comments)    Hallucinations    Toradol [Ketorolac Tromethamine] Other (See Comments)    Chest pains   Ziprasidone Other (See Comments)    Extreme agitation   Abilify [Aripiprazole] Other (See Comments)    jerking   Compazine [Prochlorperazine Edisylate] Nausea And Vomiting   Latuda [Lurasidone Hcl] Other (See Comments)    Reports made her mind race more and made her irritable    Pristiq [Desvenlafaxine Succinate Er] Other (See Comments)    Did not work, prefers not to take   Rexulti [Brexpiprazole] Other (See Comments) and Hypertension    Elevated BP, created aggression   Diflucan [Fluconazole] Nausea And Vomiting    Metabolic Disorder Labs: Lab Results  Component Value Date   HGBA1C 5.5 05/26/2018    MPG 111.15 05/26/2018   Lab Results  Component Value Date   PROLACTIN 24.3 (H) 05/26/2018   Lab Results  Component Value Date   CHOL 177 05/26/2018   TRIG 68 05/26/2018   HDL 47 05/26/2018   CHOLHDL 3.8 05/26/2018   VLDL 14 05/26/2018   LDLCALC 116 (H) 05/26/2018   LDLCALC 102 (H) 04/21/2013   Lab Results  Component Value Date   TSH 1.485 09/04/2020    Therapeutic Level Labs: No results found for: LITHIUM No results found for: CBMZ Lab Results  Component Value Date   VALPROATE 35 (L) 07/27/2018    Current Medications: Current Outpatient Medications  Medication Sig Dispense Refill   acetaminophen (TYLENOL) 500 MG tablet Take 500-1,000 mg by mouth every 6 (six) hours as needed for mild pain (or headaches).     albuterol (VENTOLIN HFA) 108 (90 Base) MCG/ACT inhaler Inhale 2 puffs into the lungs every 6 (six) hours as needed for wheezing or shortness of breath.     b complex vitamins tablet Take 1 tablet by mouth daily.     busPIRone (BUSPAR) 5 MG tablet Take 1 tablet (5 mg total) by mouth 2 (two) times daily. 60 tablet 0   Cholecalciferol (VITAMIN D3) 125 MCG (5000 UT) CAPS Take 5,000 Units by mouth in the morning.     diphenhydrAMINE (BENADRYL) 25 mg capsule Take 25 mg by mouth every 6 (six) hours as needed for allergies.     DULoxetine (CYMBALTA) 60 MG capsule Take 60 mg by mouth See admin instructions. Take 60 mg by mouth once a day for neuropathy     gabapentin (NEURONTIN) 100 MG capsule Take 100 mg by mouth at bedtime as needed (for neuropathy).     ipratropium (ATROVENT) 0.06 % nasal spray Place 2 sprays into both nostrils in the morning.     levothyroxine (SYNTHROID) 88 MCG tablet Take 88 mcg by mouth daily before breakfast.     LORazepam (ATIVAN) 0.5 MG tablet Take 1 tablet (0.5 mg total) by mouth daily as needed for anxiety. 30 tablet 0   losartan-hydrochlorothiazide (HYZAAR) 50-12.5 MG tablet Take 1 tablet by mouth 2 (two) times daily. 180 tablet 3   MINERAL ICE 2 %  GEL Apply 1 application topically  at bedtime as needed (to painful sites).     Omega 3 1000 MG CAPS Take 1,000 mg by mouth daily.     ondansetron (ZOFRAN-ODT) 8 MG disintegrating tablet Take 8 mg by mouth every 8 (eight) hours as needed for nausea or vomiting (dissolve orally).     polyethylene glycol powder (GLYCOLAX/MIRALAX) 17 GM/SCOOP powder Take 17 g by mouth See admin instructions. Mix 17 grams of powder into a fruit smoothie and drink every morning     rOPINIRole (REQUIP) 0.5 MG tablet TAKE 1 TABLET BY MOUTH DAILY AFTER SUPPER & TAKE 2 TABLETS BY MOUTH AT BEDTIME 90 tablet 1   rosuvastatin (CRESTOR) 5 MG tablet Take 1 tablet (5 mg total) by mouth daily. 90 tablet 3   SALONPAS 3.03-27-08 % PTCH Apply 1 patch topically See admin instructions. Apply 1 patch to affected sites one to two times a day as needed for pain     traZODone (DESYREL) 100 MG tablet Take 1 tablet (100 mg total) by mouth at bedtime as needed for sleep. 30 tablet 0   vitamin C (ASCORBIC ACID) 500 MG tablet Take 500 mg by mouth daily.     No current facility-administered medications for this visit.    Musculoskeletal: Strength & Muscle Tone: within normal limits Gait & Station: normal Patient leans: N/A  Psychiatric Specialty Exam: Review of Systems  There were no vitals taken for this visit.There is no height or weight on file to calculate BMI.  General Appearance: Casual  Eye Contact:  Good  Speech:  Clear and Coherent  Volume:  Normal  Mood:  Anxious and Depressed  Affect:  Appropriate  Thought Process:  Coherent  Orientation:  Full (Time, Place, and Person)  Thought Content:  Logical  Suicidal Thoughts:  No  Homicidal Thoughts:  No  Memory:  Immediate;   Fair Recent;   Fair  Judgement:  Fair  Insight:  Fair  Psychomotor Activity:  Normal  Concentration:  Concentration: Good  Recall:  Good  Fund of Knowledge:Good  Language: Good  Akathisia:  No  Handed:  Right  AIMS (if indicated):  done  Assets:   Communication Skills Desire for Improvement Resilience Social Support  ADL's:  Intact  Cognition: WNL  Sleep:  Good   Screenings: AIMS    Flowsheet Row Admission (Discharged) from OP Visit from 05/25/2018 in [facility]  AIMS Total Score 0      AUDIT    Flowsheet Row Admission (Discharged) from OP Visit from 05/25/2018 in [facility] Admission (Discharged) from 04/26/2013 in [facility] Admission (Discharged) from 06/29/2012 in [facility]  Alcohol Use Disorder Identification Test Final Score (AUDIT) 0 0 0      Mini-Mental    Flowsheet Row Office Visit from 01/26/2017 in [facility]  Total Score (max 30 points ) 28      PHQ2-9    Flowsheet Row Counselor from 10/14/2020 in [facility] Counselor from 10/01/2020 in [facility] from 09/04/2020 in [facility] Counselor from 08/06/2015 in [facility]  PHQ-2 Total Score '6 6 2 6  '$ PHQ-9 Total Score '18 20 6 26      '$ Flowsheet Row Counselor from 10/14/2020 in [facility] Counselor from 10/01/2020 in [facility] from 09/22/2020 in [facility]  Error: Question 6 not populated Error: Q3, 4, or 5 should not be populated when Q2 is No High Risk       Assessment and Plan:  [place] to start Intensive Outpatient Program - Keep f/u appointment with transcranial magnetic stimulation ([facility]) on 10/27/2020   -Discussed initiating BuSpar 5 mg p.o. twice daily in a one-time order Ativan 0.5 mg this provider will not provide any additional refills.  Ativan or Ritlan.   Treatment plan was reviewed and agreed upon by NP T.Bobby Rumpf and patient [name] need for continued  group services  Derrill [place], NP 7/26/20221:48 PM

## 2020-10-14 NOTE — Psych (Signed)
Virtual Visit via Video Note  I connected with [name] on 09/30/20 at  9:00 AM EDT by a video enabled telemedicine application and verified that I am speaking with the correct person using two identifiers.  Location: Patient: patient home Provider: clinical home office   I discussed the limitations of evaluation and management by telemedicine and the availability of in person appointments. The patient expressed understanding and agreed to proceed.  I discussed the assessment and treatment plan with the patient. The patient was provided an opportunity to ask questions and all were answered. The patient agreed with the plan and demonstrated an understanding of the instructions.   The patient was advised to call back or seek an in-person evaluation if the symptoms worsen or if the condition fails to improve as anticipated.  Cln and pt completed treatment plan and pt stated verbal alignment with plan. Pt gave verbal consent to treatment and virtual treatment, agreement with group commitments, and permission to release information for purposes of any requested paperwork.   I provided 10 minutes of non-face-to-face time during this encounter.   Lorin Glass, LCSW

## 2020-10-15 ENCOUNTER — Other Ambulatory Visit (HOSPITAL_COMMUNITY): Payer: Medicare Other | Admitting: Psychiatry

## 2020-10-15 ENCOUNTER — Ambulatory Visit (HOSPITAL_COMMUNITY): Payer: Medicare Other

## 2020-10-15 ENCOUNTER — Other Ambulatory Visit (HOSPITAL_COMMUNITY): Payer: Medicare Other

## 2020-10-15 DIAGNOSIS — R2689 Other abnormalities of gait and mobility: Secondary | ICD-10-CM | POA: Diagnosis not present

## 2020-10-15 DIAGNOSIS — R531 Weakness: Secondary | ICD-10-CM | POA: Diagnosis not present

## 2020-10-15 DIAGNOSIS — I10 Essential (primary) hypertension: Secondary | ICD-10-CM | POA: Diagnosis not present

## 2020-10-15 DIAGNOSIS — G479 Sleep disorder, unspecified: Secondary | ICD-10-CM | POA: Diagnosis not present

## 2020-10-15 DIAGNOSIS — R000 Tachycardia, unspecified: Secondary | ICD-10-CM | POA: Diagnosis not present

## 2020-10-15 DIAGNOSIS — R7309 Other abnormal glucose: Secondary | ICD-10-CM | POA: Diagnosis not present

## 2020-10-15 DIAGNOSIS — Q670 Congenital facial asymmetry: Secondary | ICD-10-CM | POA: Diagnosis not present

## 2020-10-16 ENCOUNTER — Telehealth (HOSPITAL_COMMUNITY): Payer: Self-pay | Admitting: Psychiatry

## 2020-10-16 ENCOUNTER — Other Ambulatory Visit (HOSPITAL_COMMUNITY): Payer: Medicare Other

## 2020-10-16 ENCOUNTER — Ambulatory Visit (HOSPITAL_COMMUNITY): Payer: Medicare Other

## 2020-10-16 ENCOUNTER — Ambulatory Visit (HOSPITAL_COMMUNITY): Payer: Medicaid Other | Admitting: Psychiatry

## 2020-10-17 ENCOUNTER — Other Ambulatory Visit (HOSPITAL_COMMUNITY): Payer: Medicare Other | Admitting: Psychiatry

## 2020-10-17 ENCOUNTER — Other Ambulatory Visit: Payer: Self-pay

## 2020-10-17 ENCOUNTER — Other Ambulatory Visit (HOSPITAL_COMMUNITY): Payer: Medicare Other

## 2020-10-17 ENCOUNTER — Ambulatory Visit (HOSPITAL_COMMUNITY): Payer: Medicare Other

## 2020-10-17 ENCOUNTER — Encounter (HOSPITAL_COMMUNITY): Payer: Self-pay

## 2020-10-17 DIAGNOSIS — F909 Attention-deficit hyperactivity disorder, unspecified type: Secondary | ICD-10-CM | POA: Diagnosis not present

## 2020-10-17 DIAGNOSIS — Z79899 Other long term (current) drug therapy: Secondary | ICD-10-CM | POA: Diagnosis not present

## 2020-10-17 DIAGNOSIS — Z888 Allergy status to other drugs, medicaments and biological substances status: Secondary | ICD-10-CM | POA: Diagnosis not present

## 2020-10-17 DIAGNOSIS — R4589 Other symptoms and signs involving emotional state: Secondary | ICD-10-CM | POA: Diagnosis present

## 2020-10-17 DIAGNOSIS — G4733 Obstructive sleep apnea (adult) (pediatric): Secondary | ICD-10-CM | POA: Diagnosis not present

## 2020-10-17 DIAGNOSIS — R41844 Frontal lobe and executive function deficit: Secondary | ICD-10-CM | POA: Diagnosis not present

## 2020-10-17 DIAGNOSIS — F411 Generalized anxiety disorder: Secondary | ICD-10-CM | POA: Diagnosis not present

## 2020-10-17 DIAGNOSIS — E039 Hypothyroidism, unspecified: Secondary | ICD-10-CM | POA: Diagnosis not present

## 2020-10-17 DIAGNOSIS — F332 Major depressive disorder, recurrent severe without psychotic features: Secondary | ICD-10-CM

## 2020-10-17 DIAGNOSIS — Z85528 Personal history of other malignant neoplasm of kidney: Secondary | ICD-10-CM | POA: Diagnosis not present

## 2020-10-17 DIAGNOSIS — Z905 Acquired absence of kidney: Secondary | ICD-10-CM | POA: Diagnosis not present

## 2020-10-17 DIAGNOSIS — Z79890 Hormone replacement therapy (postmenopausal): Secondary | ICD-10-CM | POA: Diagnosis not present

## 2020-10-17 DIAGNOSIS — E669 Obesity, unspecified: Secondary | ICD-10-CM | POA: Diagnosis not present

## 2020-10-17 DIAGNOSIS — I2510 Atherosclerotic heart disease of native coronary artery without angina pectoris: Secondary | ICD-10-CM | POA: Diagnosis not present

## 2020-10-17 DIAGNOSIS — I129 Hypertensive chronic kidney disease with stage 1 through stage 4 chronic kidney disease, or unspecified chronic kidney disease: Secondary | ICD-10-CM | POA: Diagnosis not present

## 2020-10-17 DIAGNOSIS — K219 Gastro-esophageal reflux disease without esophagitis: Secondary | ICD-10-CM | POA: Diagnosis not present

## 2020-10-17 DIAGNOSIS — Z883 Allergy status to other anti-infective agents status: Secondary | ICD-10-CM | POA: Diagnosis not present

## 2020-10-17 NOTE — Progress Notes (Signed)
Virtual Visit via Video Note  I connected with [name] on 10/17/20 at  9:00 AM EDT by a video enabled telemedicine application and verified that I am speaking with the correct person using two identifiers.  At orientation to the IOP program, Case Manager discussed the limitations of evaluation and management by telemedicine and the availability of in person appointments. The patient expressed understanding and agreed to proceed with virtual visits throughout the duration of the program.   Location:  Patient: Patient Home Provider: Home Office   History of Present Illness: MDD  Observations/Objective: Check In: Case Manager checked in with all participants to review discharge dates, insurance authorizations, work-related documents and needs from the treatment team regarding medications. Client stated needs and engaged in discussion. Case Manager introduced 1 new Client to the group, with group members welcoming and starting the joining process.  Initial Therapeutic Counselor facilitated a check-in with group members to assess mood and current functioning. Client shared details of their mental health management since our last session, including challenges and successes. Counselor engaged group in discussion, covering the following topics: anger management, self-expression through body art, ADLs impacted by mental health, and maintaining healthy relationships while managing mental health. Client presents with moderate depression and mild anxiety. Client denied any current SI/HI/psychosis.   Second Therapeutic Activity: Counselor introduced [name], representative with [employer] to share about programming. Group Members asked questions and engaged in discussion, as [name] shared about Peer Support, Support Groups and the [employer]. Client stated that they are interested in connecting with the [facility] group, support group and peer support.  Check Out:  Counselor  closed program by allowing time to celebrate 2 graduating group members. Counselor shared reflections on progress and allow space for group members to share well wishes and encouragements with the graduating client. Counselor prompted graduating client to share takeaways, reflect on progress and final thoughts for the group. Client endorsed safety plan to be followed to prevent safety issues.  Assessment and Plan: Clinician recommends that Client remain in IOP treatment to better manage mental health symptoms, stabilization and to address treatment plan goals. Clinician recommends adherence to crisis/safety plan, taking medications as prescribed, and following up with medical professionals if any issues arise.    Follow Up Instructions: Clinician will send Webex link for next session. The Client was advised to call back or seek an in-person evaluation if the symptoms worsen or if the condition fails to improve as anticipated.     I provided 90 minutes of non-face-to-face time during this encounter.     Lise Auer, LCSW

## 2020-10-18 ENCOUNTER — Encounter (HOSPITAL_COMMUNITY): Payer: Self-pay

## 2020-10-19 DIAGNOSIS — M1710 Unilateral primary osteoarthritis, unspecified knee: Secondary | ICD-10-CM | POA: Diagnosis not present

## 2020-10-19 DIAGNOSIS — M545 Low back pain, unspecified: Secondary | ICD-10-CM | POA: Diagnosis not present

## 2020-10-20 ENCOUNTER — Other Ambulatory Visit (HOSPITAL_COMMUNITY): Payer: Medicare Other | Attending: Psychiatry | Admitting: Psychiatry

## 2020-10-20 ENCOUNTER — Ambulatory Visit (INDEPENDENT_AMBULATORY_CARE_PROVIDER_SITE_OTHER): Payer: Medicare Other | Admitting: Cardiology

## 2020-10-20 ENCOUNTER — Other Ambulatory Visit: Payer: Self-pay

## 2020-10-20 ENCOUNTER — Encounter (HOSPITAL_BASED_OUTPATIENT_CLINIC_OR_DEPARTMENT_OTHER): Payer: Self-pay | Admitting: Cardiology

## 2020-10-20 ENCOUNTER — Other Ambulatory Visit (HOSPITAL_COMMUNITY): Payer: Self-pay | Admitting: Family Medicine

## 2020-10-20 VITALS — BP 120/72 | HR 68 | Ht 64.0 in | Wt 223.0 lb

## 2020-10-20 DIAGNOSIS — I10 Essential (primary) hypertension: Secondary | ICD-10-CM | POA: Diagnosis not present

## 2020-10-20 DIAGNOSIS — I2510 Atherosclerotic heart disease of native coronary artery without angina pectoris: Secondary | ICD-10-CM

## 2020-10-20 DIAGNOSIS — E7800 Pure hypercholesterolemia, unspecified: Secondary | ICD-10-CM

## 2020-10-20 DIAGNOSIS — Z712 Person consulting for explanation of examination or test findings: Secondary | ICD-10-CM

## 2020-10-20 DIAGNOSIS — Q670 Congenital facial asymmetry: Secondary | ICD-10-CM

## 2020-10-20 DIAGNOSIS — Z7189 Other specified counseling: Secondary | ICD-10-CM

## 2020-10-20 DIAGNOSIS — R4589 Other symptoms and signs involving emotional state: Secondary | ICD-10-CM | POA: Diagnosis present

## 2020-10-20 DIAGNOSIS — F332 Major depressive disorder, recurrent severe without psychotic features: Secondary | ICD-10-CM | POA: Diagnosis not present

## 2020-10-20 NOTE — Patient Instructions (Signed)

## 2020-10-20 NOTE — Progress Notes (Signed)
Cardiology Office Note   Date:  10/20/2020   ID:  [name], [name] [birth year], MRN [id number]  PCP:  Lawerance Cruel, MD  Cardiologist:  Buford Dresser, MD   Referring MD: Chipper Herb Family M*   CC: follow up  History of Present Illness:    [name] is a 75 y.o. female with a hx of PVCs, paroxysmal atrial tachycardia s/p ablation, hypertension, CAD, renal cell carcinoma s/p nephrectomy who is seen for follow up today. I initially met her 02/13/19 as a new patient to me/prior patient of Dr. Radford Pax for the evaluation and management of her above issues.  Today: Recently she had 2 visits to the ED. Lately, while she is lying in bed she has been hearing her heartbeat and feeling a "pounding." There has been no correlation between taking ritalin and her palpitations.   While in the ED, she was told her symptoms were caused by anxiety rather than her heart.  She is in an intensive outpatient program, which she feels is helping. She believes her main source of anxiety comes from her current living situation. Her beta-blocker is helping her anxiety by 25% per her experience.   Also, she is concerned that her blood pressure has been elevated. She has seen readings of 401/02-725/36 systolic in the last month. While on metoprolol, her blood pressure improved slightly but remains elevated.  She has been told that she has fibromyalgia, but her treatment options are limited. She has been off and on prednisone several times, but this does not seem to be helping as much lately.  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 She states that "she can't walk right" and that it is worsening. She feels wobbly and off balance. Sometimes she has tingling in her hands and feet as well. After beginning metoprolol her LE edema resolved.  Typically she eats healthy foods, but she notes she is eating too much and gaining weight.  She denies any chest pain, shortness of breath, or  exertional symptoms. No headaches, lightheadedness, or syncope to report. Also has no orthopnea or PND. Tearful at times.   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 FFR of LAD  not performed because of difficulty with catheter control from the right radial.    Chronic cough 02/22/2020   Chronic kidney disease    hx of kidney cancer   Diastolic dysfunction, left ventricle 05/31/2013   Difficult intubation    told by [facility] that she was hard to intubate 15 yrs ago in [place]- surgery since then no problems   Dyspnea    Essential hypertension 05/31/2013   Generalized anxiety disorder    Adequate for discharge    GERD (gastroesophageal reflux disease)    History of attention deficit disorder    History of echocardiogram 07/2012   Normal LVF w grade I siastolic dysfunction    History of endometriosis    History of gall stones 12/31/2009   History of pleural effusion  Hyperlipidemia 06/24/2016   LDL goal <70   Hypertension    Hypothyroidism 1990   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 Elsworth Soho discussed inspire device, patient would need to lose weight    PAT (paroxysmal atrial tachycardia)    s/p ablation   PONV (postoperative nausea and vomiting)    PTSD (post-traumatic stress disorder)    Pure hypercholesterolemia 02/18/2019   PVC's (premature ventricular contractions)    Renal mass 02/15/2012   Restless legs syndrome (RLS) 02/20/2007   Vertigo     Past Surgical History:  Procedure Laterality Date   CARDIAC ELECTROPHYSIOLOGY MAPPING AND ABLATION  2000s   LAPAROSCOPIC NEPHRECTOMY, HAND ASSISTED Right 07/31/2019   Procedure: HAND ASSISTED LAPAROSCOPIC NEPHRECTOMY CONVERTED TO OPEN WITH REPAIR OF INFERIOR VENA CAVAOTOMY;  Surgeon: Robley Fries, MD;  Location: [facility];  Service: Urology;  Laterality: Right;  3 HRS   laparoscopy     LEFT HEART CATH AND CORONARY ANGIOGRAPHY N/A 06/08/2016   Procedure: Left Heart Cath and Coronary Angiography;  Surgeon: Belva Crome, MD;  Location: [facility];  Service: Cardiovascular;  Laterality: N/A;   nasoseptal reconstruction  1990s   NEPHRECTOMY RECIPIENT Right 08/2019   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   [place]     as a child   TUBAL LIGATION  1970s   uterine mass removal  03/2012   was found to be benign    Current Medications: Current Outpatient Medications on File Prior to Visit  Medication Sig   acetaminophen (TYLENOL) 500 MG tablet Take 500-1,000 mg by mouth every 6 (six) hours as needed for mild pain (or headaches).   b complex vitamins tablet Take 1 tablet by mouth daily.   Cholecalciferol (VITAMIN D3) 125 MCG (5000 UT) CAPS Take 5,000 Units by mouth in the morning.   diphenhydrAMINE (BENADRYL) 25 mg capsule Take 25 mg by mouth every 6 (six) hours as needed for allergies.   ipratropium (ATROVENT) 0.06 % nasal spray Place 2 sprays into both nostrils in the morning.   levothyroxine (SYNTHROID) 88 MCG tablet Take 88 mcg by mouth daily before breakfast.   LORazepam (ATIVAN) 0.5 MG tablet Take 1 tablet (0.5 mg total) by mouth daily as needed for  anxiety.   losartan-hydrochlorothiazide (HYZAAR) 50-12.5 MG tablet Take 1 tablet by mouth 2 (two) times daily.   metoprolol tartrate (LOPRESSOR) 25 MG tablet Take 25 mg by mouth 2 (two) times daily.   MINERAL ICE 2 % GEL Apply 1 application topically at bedtime as needed (to painful sites).   Omega 3 1000 MG CAPS Take 1,000 mg by mouth daily.   polyethylene glycol powder (GLYCOLAX/MIRALAX) 17 GM/SCOOP powder Take 17 g by mouth See admin instructions. Mix 17 grams of powder into a fruit smoothie and drink every morning   predniSONE (DELTASONE) 20 MG tablet Take 1 tablet twice a day for 5 days and then 1 tablet daily for 5 days   rOPINIRole (REQUIP) 0.5 MG tablet TAKE 1 TABLET BY MOUTH DAILY AFTER SUPPER & TAKE 2 TABLETS BY MOUTH AT BEDTIME   SALONPAS 3.03-27-08 %  [place] Apply 1 patch topically See admin instructions. Apply 1 patch to affected sites one to two times a day as needed for pain   traZODone (DESYREL) 100 MG tablet Take 1 tablet (100 mg total) by mouth at bedtime as needed for sleep.   vitamin C (ASCORBIC ACID) 500 MG tablet Take 500 mg by mouth daily.   albuterol (VENTOLIN HFA) 108 (90 Base) MCG/ACT inhaler Inhale 2 puffs into the lungs every 6 (six) hours as needed for wheezing or shortness of breath. (Patient not taking: Reported on 10/20/2020)   ondansetron (ZOFRAN-ODT) 8 MG disintegrating tablet Take 8 mg by mouth every 8 (eight) hours as needed for nausea or vomiting (dissolve orally). (Patient not taking: Reported on 10/20/2020)   rosuvastatin (CRESTOR) 5 MG tablet Take 1 tablet (5 mg total) by mouth daily.   No current facility-administered medications on file prior to visit.     Allergies:   Geodon [ziprasidone hydrochloride], Lithium, Talwin [pentazocine], Toradol [ketorolac tromethamine], Ziprasidone, Abilify [aripiprazole], Compazine [prochlorperazine edisylate], Latuda [lurasidone hcl], Pristiq [desvenlafaxine succinate er], Rexulti [brexpiprazole], and Diflucan [fluconazole]    Social History   Tobacco Use   Smoking status: Never   Smokeless tobacco: Never  Vaping Use   Vaping Use: Never used  Substance Use Topics   Alcohol use: Not Currently    Alcohol/week: 1.0 standard drink    Types: 1 Glasses of wine per week   Drug use: No    Family History: family history includes Alcohol abuse in her father; Allergies in her daughter and father; Autoimmune disease in her brother; CAD in her mother; Cancer in her paternal grandfather; Colon cancer in her paternal grandmother; Depression in her mother; Heart disease in her maternal grandfather and mother; Hypertension in her mother; Mental illness in her father and maternal grandmother; Multiple sclerosis in her sister; OCD in her paternal grandmother; Parkinson's disease in her paternal grandfather; Skin cancer in her father.  ROS:   Please see the history of present illness.   (+) Palpitations (+) Anxiety (+) Depression (+) Myalgias (+) Arthritis, bilateral knees (+) Gait instability (+) Tingling, bilateral hands and feet (+) Tearful Additional pertinent ROS unremarkable except as documented.  EKGs/Labs/Other Studies Reviewed:    The following studies were reviewed today:  Lexiscan Myoview 08/12/2020: The left ventricular ejection fraction is normal (55-65%). Nuclear stress EF: 63%. There was no ST segment deviation noted during stress. No T wave inversion was noted during stress. The study is normal. This is a low risk study.   1.  Normal study without evidence of ischemia or infarction. 2.  Normal left ventricular function, EF 63%. 3.  This is a low risk study.  Monitor 03/20/2019: 7 days of data recorded on Zio monitor. Patient had a min HR of 43 bpm, max HR of 193 bpm, and avg HR of 6 bpm. Predominant underlying rhythm was Sinus Rhythm. No VT, atrial fibrillation, high degree block, or pauses noted. One true SVT noted, 8 beats at 193 bpm. Two other events labeled as SVT are either sinus (1) or  difficult to determine (1). Isolated atrial and ventricular ectopy was rare (<1%). There were 42 triggered events, the vast majority of which were sinus rhythm with or without brief ectopy. There was significant artifact on many on the triggered events. There was one episode of ventricular trigeminy lasting approximately 10 seconds. No high risk findings.  Cath 06/08/2016 50-70% stenosis in the mid LAD proximal to the first diagonal. FFR not performed because of difficulty with  catheter control from the right radial.  75-80% stenosis in the midsegment of the first obtuse marginal.  Luminal irregularities in the mid RCA.  Overall normal LV function with EF percent.    Recommendations:    Aggressive risk factor modification and anti-ischemic therapy ( beta blocker, statin therapy and possibly LA nitrates) in this non-ACS subset. PCI in this setting does not meet appropriate use criteria.  If limiting symptoms on medications or convincing evidence of ischemia, consider obtuse marginal PCI and/or higher than usual risk PCI on the LAD (due to calcification and angulation).   Lexiscan 09/19/2014 Nuclear stress EF: 70%. Horizontal ST segment depression ST segment depression of 1 mm was noted during stress in the V5, V6, aVF, II, III and I leads, and returning to baseline after 5-9 minutes of recovery. The study is normal. This is a low risk study. The left ventricular ejection fraction is hyperdynamic (>65%).   Low risk stress nuclear study with normal perfusion and normal left ventricular regional and global systolic function. Abnormal ECG response may represent a "false positive".  Echo 04/22/2013 - Left ventricle: The cavity size was normal. Systolic    function was normal. The estimated ejection fraction was    in the range of 55% to 60%. Wall motion was normal; there    were no regional wall motion abnormalities. Left    ventricular diastolic function parameters were normal.  - Pulmonary  arteries: PA peak pressure: 39m Hg (S).   EKG:  EKG is personally reviewed.   10/20/2020: NSR at 68 bpm 11/09/2019: EKG was not ordered. The ekg ordered 05/23/19 demonstrates NSR  Recent Labs: 09/04/2020: ALT 15; B Natriuretic Peptide 1,009.1; Magnesium 2.2; TSH 1.485 09/22/2020: BUN 19; Creatinine, Ser 1.09; Hemoglobin 13.0; Platelets 223; Potassium 3.9; Sodium 139  Recent Lipid Panel    Component Value Date/Time   CHOL 177 05/26/2018 0625   TRIG 68 05/26/2018 0625   HDL 47 05/26/2018 0625   CHOLHDL 3.8 05/26/2018 0625   VLDL 14 05/26/2018 0625   LDLCALC 116 (H) 05/26/2018 0625    Physical Exam:    VS:  BP 120/72   Pulse 68   Ht 5' 4"  (1.626 m)   Wt 223 lb (101.2 kg)   BMI 38.28 kg/m     Wt Readings from Last 3 Encounters:  10/20/20 223 lb (101.2 kg)  09/22/20 215 lb (97.5 kg)  08/12/20 217 lb (98.4 kg)    GEN: Well nourished, well developed in no acute distress HEENT: Normal, moist mucous membranes NECK: No JVD CARDIAC: regular rhythm, normal S1 and S2, no rubs or gallops. No murmur. VASCULAR: Radial and DP pulses 2+ bilaterally. No carotid bruits RESPIRATORY:  Clear to auscultation without rales, wheezing or rhonchi  ABDOMEN: Soft, non-tender, non-distended MUSCULOSKELETAL:  Ambulates independently SKIN: Warm and dry, no edema NEUROLOGIC:  Alert and oriented x 3. No focal neuro deficits noted. PSYCHIATRIC:  Normal affect    ASSESSMENT:    1. Coronary artery disease involving native coronary artery of native heart without angina pectoris   2. Essential hypertension   3. Pure hypercholesterolemia   4. Cardiac risk counseling   5. Encounter to discuss test results     PLAN:    Coronary disease by cath 2018, exercise intolerance -based on cath in 2018, would aim for medical management as much as tolerated. -we reviewed the results of her recent nuclear stress test. Very reassuring, normal EF, no evidence of ischemia -recommended aspirin, statin in the past. See  below -beta blocker as below  Hypercholesterolemia: LDL goal <70, last was 101 per [facility] on 08/07/20 -has not tolerated multiple statins in the past. Was tolerating rosuvastatin 5 mg daily, endorses that she is currently taking this -we have discussed additional therapies, but with her extensive history of medication intolerances, will continue current plan  Hypertension: at goal in office today -continue losartan-HCTZ 50-12.5 mg daily -feels that metoprolol has helped her since starting, continue  Anxiety/depression: she is being followed closely for management of these. We discussed how these can overlap with cardiovascular issues today  Cardiac risk counseling and prevention recommendations: -recommend heart healthy/Mediterranean diet, with whole grains, fruits, vegetable, fish, lean meats, nuts, and olive oil. Limit salt. -recommend moderate walking, 3-5 times/week for 30-50 minutes each session. Aim for at least 150 minutes.week. Goal should be pace of 3 miles/hours, or walking 1.5 miles in 30 minutes -recommend avoidance of tobacco products. Avoid excess alcohol.  Plan for follow up: 1 year or sooner as needed.  Buford Dresser, MD, PhD [facility]  [facility]   Medication Adjustments/Labs and Tests Ordered: Current medicines are reviewed at length with the patient today.  Concerns regarding medicines are outlined above.  Orders Placed This Encounter  Procedures   EKG 12-Lead    No orders of the defined types were placed in this encounter.  Patient Instructions  Medication Instructions:  Your Physician recommend you continue on your current medication as directed.    *If you need a refill on your cardiac medications before your next appointment, please call your pharmacy*   Lab Work: None ordered today   Testing/Procedures: None ordered today   Follow-Up: At [facility], you and your health needs are our priority.  As part of our continuing mission to  provide you with exceptional heart care, we have created designated Provider Care Teams.  These Care Teams include your primary Cardiologist (physician) and Advanced Practice Providers (APPs -  Physician Assistants and Nurse Practitioners) who all work together to provide you with the care you need, when you need it.  We recommend signing up for the patient portal called "MyChart".  Sign up information is provided on this After Visit Summary.  MyChart is used to connect with patients for Virtual Visits (Telemedicine).  Patients are able to view lab/test results, encounter notes, upcoming appointments, etc.  Non-urgent messages can be sent to your provider as well.   To learn more about what you can do with MyChart, go to [email or web address].    Your next appointment:   1 year(s)  The format for your next appointment:   In Person  Provider:   Buford Dresser, MD    [facility],acting as a scribe for Buford Dresser, MD.,have documented all relevant documentation on the behalf of Buford Dresser, MD,as directed by  Buford Dresser, MD while in the presence of Buford Dresser, MD.  I, Buford Dresser, MD, have reviewed all documentation for this visit. The documentation on 10/20/20 for the exam, diagnosis, procedures, and orders are all accurate and complete.   Signed, Buford Dresser, MD PhD 10/20/2020  [facility]

## 2020-10-21 ENCOUNTER — Encounter (HOSPITAL_COMMUNITY): Payer: Self-pay

## 2020-10-21 ENCOUNTER — Other Ambulatory Visit (HOSPITAL_COMMUNITY): Payer: Medicare Other | Admitting: Psychiatry

## 2020-10-21 ENCOUNTER — Encounter (HOSPITAL_COMMUNITY): Payer: Self-pay | Admitting: Psychiatry

## 2020-10-21 DIAGNOSIS — F332 Major depressive disorder, recurrent severe without psychotic features: Secondary | ICD-10-CM

## 2020-10-21 NOTE — Progress Notes (Signed)
Virtual Visit via Video Note  I connected with [name] on 10/21/20 at  9:00 AM EDT by a video enabled telemedicine application and verified that I am speaking with the correct person using two identifiers.  Location: At orientation to the IOP program, Case Manager discussed the limitations of evaluation and management by telemedicine and the availability of in person appointments. The patient expressed understanding and agreed to proceed with virtual visits throughout the duration of the program.   Location:  Patient: Patient Home Provider: Home Office   History of Present Illness: MDD  Observations/Objective: Check In: Case Manager checked in with all participants to review discharge dates, insurance authorizations, work-related documents and needs from the treatment team regarding medications. Client stated needs and engaged in discussion. Case Manager introduced a new group member, as group members welcoming and starting the joining process.   Initial Therapeutic Counselor facilitated a check-in with group members to assess mood and current functioning. Client shared details of their mental health management since our last session, including challenges and successes. Counselor engaged group in discussion, covering the following topics: strategies for caregiving, encouraging self-care, childhood trauma and affects into adulthood, pros and cons to family support, and assertively communicating with support system. Client presents with moderate depression and moderate anxiety. Client denied any current SI/HI/psychosis.   Second Therapeutic Activity: Counselor reviewed a variety of treatment modalities for individual therapy, including brain spotting, EMDR, DBT, ECT and TMS to address depression and trauma. Group members asked questions and shared personal experiences with treatments.   Check Out: Counselor encouraged group members to incorporate movement and self-care in their  afternoon. Client endorsed safety plan to be followed to prevent safety issues.  Assessment and Plan: Clinician recommends that Client remain in IOP treatment to better manage mental health symptoms, stabilization and to address treatment plan goals. Clinician recommends adherence to crisis/safety plan, taking medications as prescribed, and following up with medical professionals if any issues arise.    Follow Up Instructions: Clinician will send Webex link for next session. The Client was advised to call back or seek an in-person evaluation if the symptoms worsen or if the condition fails to improve as anticipated.     I provided 180 minutes of non-face-to-face time during this encounter.     Lise Auer, LCSW

## 2020-10-21 NOTE — Progress Notes (Signed)
Virtual Visit via Video Note  I connected with [name] on 10/20/20 at  4:00 PM EDT by a video enabled telemedicine application and verified that I am speaking with the correct person using two identifiers.  At orientation to the IOP program, Case Manager discussed the limitations of evaluation and management by telemedicine and the availability of in person appointments. The patient expressed understanding and agreed to proceed with virtual visits throughout the duration of the program.   Location:  Patient: Patient Home Provider: Home Office   History of Present Illness: MDD  Observations/Objective: Check In: Case Manager checked in with all participants to review discharge dates, insurance authorizations, work-related documents and needs from the treatment team regarding medications. Client stated needs and engaged in discussion.  Initial Therapeutic Counselor facilitated a check-in with group members to assess mood and current functioning. Client shared details of their mental health management since our last session, including challenges and successes. Counselor engaged group in discussion, covering the following topics: boundary setting, advocating for self, healthy anger releases, self-care application, panic attacks, and trauma processing. Client presents with moderate depression and moderate anxiety. Client denied any current SI/HI/psychosis.   Second Therapeutic Activity: Counselor engaged group in identifying internal and external stressors in the workplace. Client engaged in conversation. Counselor shared psychoeducation on strategies for managing workplace stress through 2 videos created by a doctor medicine and a doctor in psychology. Counselor prompted group members to share takeaways and discuss application of concepts in a personalized manner to their unique work environments. Client shared several ways they plan to implement strategies.  Check Out:  Counselor  encouraged group members to make a plan for return to work and structuring personal time for homework. Client endorsed safety plan to be followed to prevent safety issues.  Assessment and Plan: Clinician recommends that Client remain in IOP treatment to better manage mental health symptoms, stabilization and to address treatment plan goals. Clinician recommends adherence to crisis/safety plan, taking medications as prescribed, and following up with medical professionals if any issues arise.    Follow Up Instructions: Clinician will send Webex link for next session. The Client was advised to call back or seek an in-person evaluation if the symptoms worsen or if the condition fails to improve as anticipated.     I provided 180 minutes of non-face-to-face time during this encounter.     Lise Auer, LCSW

## 2020-10-22 ENCOUNTER — Other Ambulatory Visit (HOSPITAL_COMMUNITY): Payer: Medicare Other

## 2020-10-22 ENCOUNTER — Other Ambulatory Visit: Payer: Self-pay

## 2020-10-23 ENCOUNTER — Telehealth (HOSPITAL_COMMUNITY): Payer: Medicaid Other | Admitting: Psychiatry

## 2020-10-23 ENCOUNTER — Other Ambulatory Visit (HOSPITAL_COMMUNITY): Payer: Medicare Other

## 2020-10-23 ENCOUNTER — Other Ambulatory Visit: Payer: Self-pay

## 2020-10-24 ENCOUNTER — Other Ambulatory Visit: Payer: Self-pay

## 2020-10-24 ENCOUNTER — Other Ambulatory Visit (HOSPITAL_COMMUNITY): Payer: Medicare Other | Admitting: Psychiatry

## 2020-10-24 ENCOUNTER — Encounter (HOSPITAL_BASED_OUTPATIENT_CLINIC_OR_DEPARTMENT_OTHER): Payer: Self-pay

## 2020-10-24 ENCOUNTER — Encounter (HOSPITAL_COMMUNITY): Payer: Self-pay

## 2020-10-24 DIAGNOSIS — M797 Fibromyalgia: Secondary | ICD-10-CM | POA: Diagnosis not present

## 2020-10-24 DIAGNOSIS — M25511 Pain in right shoulder: Secondary | ICD-10-CM | POA: Diagnosis not present

## 2020-10-24 DIAGNOSIS — F332 Major depressive disorder, recurrent severe without psychotic features: Secondary | ICD-10-CM | POA: Diagnosis not present

## 2020-10-24 DIAGNOSIS — M170 Bilateral primary osteoarthritis of knee: Secondary | ICD-10-CM | POA: Diagnosis not present

## 2020-10-24 DIAGNOSIS — M792 Neuralgia and neuritis, unspecified: Secondary | ICD-10-CM | POA: Diagnosis not present

## 2020-10-24 NOTE — Progress Notes (Signed)
Virtual Visit via Video Note  I connected with [name] on 10/24/20 at  9:00 AM EDT by a video enabled telemedicine application and verified that I am speaking with the correct person using two identifiers.  At orientation to the IOP program, Case Manager discussed the limitations of evaluation and management by telemedicine and the availability of in person appointments. The patient expressed understanding and agreed to proceed with virtual visits throughout the duration of the program.   Location:  Patient: Patient Home Provider: Home Office   History of Present Illness: MDD  Observations/Objective: Check In: Case Manager checked in with all participants to review discharge dates, insurance authorizations, work-related documents and needs from the treatment team regarding medications. Client stated needs and engaged in discussion.   Initial Therapeutic Activity: Counselor facilitated a check-in with group members to assess mood and current functioning. Client shared details of their mental health management since our last session, including challenges and successes. Counselor engaged group in discussion, covering the following topics: impact of COVID on [place], caregiving challenges, boundary setting, advocating for others, [place] treatment, and brain imaging. Client presents with moderate depression and moderate anxiety. Client denied any current SI/HI/psychosis.  Second Therapeutic Activity: Counselor engage group in deep breathing techniques, paired with positive affirmations. Group gave positive feedback, noting reduce in stress and tension in body. Counselor provided psychoeducation on the benefits of combatting negative cognitions and compression with these practices.     Check Out: Counselor prompted group members to identify one self-care practice or productivity activity they would like to engage in today. Client plans to attend dr appointment and rest. Client endorsed safety plan to  be followed to prevent safety issues.  Assessment and Plan: Clinician recommends that Client remain in IOP treatment to better manage mental health symptoms, stabilization and to address treatment plan goals. Clinician recommends adherence to crisis/safety plan, taking medications as prescribed, and following up with medical professionals if any issues arise.    Follow Up Instructions: Clinician will send Webex link for next session. The Client was advised to call back or seek an in-person evaluation if the symptoms worsen or if the condition fails to improve as anticipated.     I provided 180 minutes of non-face-to-face time during this encounter.     Lise Auer, LCSW

## 2020-10-27 ENCOUNTER — Other Ambulatory Visit: Payer: Self-pay

## 2020-10-27 ENCOUNTER — Encounter (HOSPITAL_COMMUNITY): Payer: Self-pay

## 2020-10-27 ENCOUNTER — Other Ambulatory Visit (HOSPITAL_COMMUNITY): Payer: Medicare Other | Admitting: Psychiatry

## 2020-10-27 DIAGNOSIS — F332 Major depressive disorder, recurrent severe without psychotic features: Secondary | ICD-10-CM | POA: Diagnosis not present

## 2020-10-27 DIAGNOSIS — R110 Nausea: Secondary | ICD-10-CM | POA: Diagnosis not present

## 2020-10-27 DIAGNOSIS — R109 Unspecified abdominal pain: Secondary | ICD-10-CM | POA: Diagnosis not present

## 2020-10-27 NOTE — Progress Notes (Signed)
Virtual Visit via Video Note  I connected with [name] on 10/27/20 at  9:00 AM EDT by a video enabled telemedicine application and verified that I am speaking with the correct person using two identifiers.  At orientation to the IOP program, Case Manager discussed the limitations of evaluation and management by telemedicine and the availability of in person appointments. The patient expressed understanding and agreed to proceed with virtual visits throughout the duration of the program.   Location:  Patient: Patient Home Provider: Home Office   History of Present Illness: MDD  Observations/Objective: Check In: Case Manager checked in with all participants to review discharge dates, insurance authorizations, work-related documents and needs from the treatment team regarding medications. Client stated needs and engaged in discussion.   Initial Therapeutic Activity: Counselor facilitated a check-in with group members to assess mood and current functioning. Client shared details of their mental health management since our last session, including challenges and successes. Counselor engaged group in discussion, covering the following topics: personal cultures and how mental health is viewed, parental mental health influences, facing fears/intense emotions, life goals and managing expectations. Client presents with moderate depression and moderate anxiety. Client denied any current SI/HI/psychosis.  Second Therapeutic Activity: Counselor presented information and resources on forgiveness, radical forgiveness, self-forgiveness and healing from past/childhood wounds. Group members shared their experiences and beliefs on these subjects. Client discussed how information is applicable to their own mental health and treatment goals/outcomes. Counselor shared additional resources for self/homework via e-mail.   Check Out: Counselor prompted group members to identify one self-care practice or  productivity activity they would like to engage in today. Client plans to reconnect with friends and work on new housing. Client endorsed safety plan to be followed to prevent safety issues.  Assessment and Plan: Clinician recommends that Client remain in IOP treatment to better manage mental health symptoms, stabilization and to address treatment plan goals. Clinician recommends adherence to crisis/safety plan, taking medications as prescribed, and following up with medical professionals if any issues arise.    Follow Up Instructions: Clinician will send Webex link for next session. The Client was advised to call back or seek an in-person evaluation if the symptoms worsen or if the condition fails to improve as anticipated.     I provided 180 minutes of non-face-to-face time during this encounter.     Lise Auer, LCSW

## 2020-10-28 ENCOUNTER — Other Ambulatory Visit (HOSPITAL_COMMUNITY): Payer: Medicare Other | Admitting: Psychiatry

## 2020-10-28 ENCOUNTER — Other Ambulatory Visit: Payer: Self-pay

## 2020-10-28 ENCOUNTER — Encounter: Payer: Self-pay | Admitting: Neurology

## 2020-10-28 DIAGNOSIS — M545 Low back pain, unspecified: Secondary | ICD-10-CM | POA: Diagnosis not present

## 2020-10-28 DIAGNOSIS — F332 Major depressive disorder, recurrent severe without psychotic features: Secondary | ICD-10-CM | POA: Diagnosis not present

## 2020-10-28 DIAGNOSIS — M7062 Trochanteric bursitis, left hip: Secondary | ICD-10-CM | POA: Diagnosis not present

## 2020-10-28 DIAGNOSIS — M5442 Lumbago with sciatica, left side: Secondary | ICD-10-CM | POA: Diagnosis not present

## 2020-10-28 DIAGNOSIS — F4310 Post-traumatic stress disorder, unspecified: Secondary | ICD-10-CM

## 2020-10-28 DIAGNOSIS — M7061 Trochanteric bursitis, right hip: Secondary | ICD-10-CM | POA: Diagnosis not present

## 2020-10-28 DIAGNOSIS — F3130 Bipolar disorder, current episode depressed, mild or moderate severity, unspecified: Secondary | ICD-10-CM

## 2020-10-29 ENCOUNTER — Encounter (HOSPITAL_COMMUNITY): Payer: Self-pay

## 2020-10-29 ENCOUNTER — Other Ambulatory Visit (HOSPITAL_COMMUNITY): Payer: Medicare Other | Admitting: Psychiatry

## 2020-10-29 ENCOUNTER — Telehealth (HOSPITAL_COMMUNITY): Payer: Self-pay | Admitting: Psychiatry

## 2020-10-29 ENCOUNTER — Other Ambulatory Visit: Payer: Self-pay

## 2020-10-29 ENCOUNTER — Telehealth (HOSPITAL_COMMUNITY): Payer: Self-pay

## 2020-10-29 NOTE — Telephone Encounter (Signed)
Called pt to reschedule [place] consult that she missed on 10/23/20 . Pt states that she is overwhelmed at the moment and will call back to reschedule.

## 2020-10-29 NOTE — Progress Notes (Signed)
Virtual Visit via Video Note  I connected with [name] on 10/28/20 at  9:00 AM EDT by a video enabled telemedicine application and verified that I am speaking with the correct person using two identifiers.  At orientation to the IOP program, [name] discussed the limitations of evaluation and management by telemedicine and the availability of in person appointments. The patient expressed understanding and agreed to proceed with virtual visits throughout the duration of the program.   Location:  Patient: Patient Home Provider: Home Office   History of Present Illness: Bipolar 1 DO and MDD  Observations/Objective: Check In: [name] checked in with all participants to review discharge dates, insurance authorizations, work-related documents and needs from the treatment team regarding medications. [name] stated needs and engaged in discussion. [name] introduced a new [name], with group members welcoming and starting the joining process.   Initial Therapeutic Activity: Counselor facilitated a check-in with group members to assess mood and current functioning. [name] shared details of their mental health management since our last session, including challenges and successes. Counselor engaged group in discussion, covering the following topics: sleep disturbances, cognitive coping skills, and gratitude statements. [name] presents with moderate depression and moderate anxiety. [name] denied any current SI/HI/psychosis.  Second Therapeutic Activity: Counselor continued information and discussion on forgiveness, radical forgiveness, self-forgiveness and healing from past/childhood wounds. Group members shared their experiences and beliefs on these subjects. Counselor reviewed self/homework with group. Group members shared practical applications, identifying people or situation they could forgive.    Check Out: Counselor closed program by allowing time to celebrate a graduating group  member. Counselor shared reflections on progress and allow space for group members to share well wishes and encouragements with the graduating [name]. Counselor prompted graduating [name] to share takeaways, reflect on progress and final thoughts for the group. [name] endorsed safety plan to be followed to prevent safety issues.  Assessment and Plan: Clinician recommends that [name] remain in IOP treatment to better manage mental health symptoms, stabilization and to address treatment plan goals. Clinician recommends adherence to crisis/safety plan, taking medications as prescribed, and following up with medical professionals if any issues arise.    Follow Up Instructions: Clinician will send Webex link for next session. The [name] was advised to call back or seek an in-person evaluation if the symptoms worsen or if the condition fails to improve as anticipated.     I provided 180 minutes of non-face-to-face time during this encounter.     Lise Auer, LCSW

## 2020-10-30 ENCOUNTER — Other Ambulatory Visit: Payer: Self-pay

## 2020-10-30 ENCOUNTER — Other Ambulatory Visit (HOSPITAL_COMMUNITY): Payer: Medicare Other

## 2020-10-31 ENCOUNTER — Other Ambulatory Visit: Payer: Self-pay

## 2020-10-31 ENCOUNTER — Other Ambulatory Visit: Payer: Self-pay | Admitting: Pulmonary Disease

## 2020-10-31 ENCOUNTER — Other Ambulatory Visit (HOSPITAL_COMMUNITY): Payer: Medicare Other | Admitting: Family

## 2020-10-31 DIAGNOSIS — R4589 Other symptoms and signs involving emotional state: Secondary | ICD-10-CM

## 2020-10-31 DIAGNOSIS — G2581 Restless legs syndrome: Secondary | ICD-10-CM

## 2020-10-31 DIAGNOSIS — F332 Major depressive disorder, recurrent severe without psychotic features: Secondary | ICD-10-CM

## 2020-11-04 ENCOUNTER — Encounter (HOSPITAL_COMMUNITY): Payer: Self-pay | Admitting: Family

## 2020-11-04 NOTE — Progress Notes (Unsigned)
Virtual Visit via Video Note  I connected with [name] on 11/04/20 at  9:00 AM EDT by a video enabled telemedicine application and verified that I am speaking with the correct person using two identifiers.  Location: Patient: Home Provider:  Office   I discussed the limitations of evaluation and management by telemedicine and the availability of in person appointments. The patient expressed understanding and agreed to proceed.    I discussed the assessment and treatment plan with the patient. The patient was provided an opportunity to ask questions and all were answered. The patient agreed with the plan and demonstrated an understanding of the instructions.   The patient was advised to call back or seek an in-person evaluation if the symptoms worsen or if the condition fails to improve as anticipated.  I provided 15 minutes of non-face-to-face time during this encounter.   Derrill Center, NP   [facility] Intensive Outpatient Program Discharge Summary  [name] [id number]  Admission date: 10/14/2020 Discharge date:  11/05/2020  Reason for admission: Per admission assessment note: [name]  is a 75 y.o. Caucasian female presents with depression and anxiety.  Patient was recently seen and evaluated at the local emergency department due to worsening depression.  States she was followed by psychiatry in the past however has not been on medications for quite some time.  States she is interested in starting [place] because she feels as if she is treatment resistant to psychotropic medications. Of note patient has a charted diagnosis of Bipolar Disorder. However stated that she has challenged this diagnosis in the past.    Progress in Program Toward Treatment Goals: Ongoing, patient attended participated with daily group session with active and engaged participation. Has successful completed Partial Hospitalization and Intensive Outpatient  programming.  Katrina was initiated on Seroquel 25 mg p.o. twice daily and 50 mg p.o. nightly however reports worsening aggression and mood irritability while taking medication. Patient to start BuSpar 5 mg p.o. twice daily. However she declined both medications.  Stated that she was sensitive to all medications. Education was provided with the safety risk with taken Ritalin and Ativan. Patient stated she will follow-up outpatient.   Progress (rationale):  patient to keep follow-up with Dr Delray Alt on [address]  11/13/2020. Patient reported she has postponed the [place] referral due to medical procedures.   Take all medications as prescribed. Keep all follow-up appointments as scheduled.  Do not consume alcohol or use illegal drugs while on prescription medications. Report any adverse effects from your medications to your primary care provider promptly.  In the event of recurrent symptoms or worsening symptoms, call 911, a crisis hotline, or go to the nearest emergency department for evaluation.    Derrill Center, NP 11/04/2020

## 2020-11-05 ENCOUNTER — Other Ambulatory Visit (HOSPITAL_COMMUNITY): Payer: Medicare Other | Admitting: Psychiatry

## 2020-11-05 DIAGNOSIS — R262 Difficulty in walking, not elsewhere classified: Secondary | ICD-10-CM | POA: Diagnosis not present

## 2020-11-05 DIAGNOSIS — R29810 Facial weakness: Secondary | ICD-10-CM | POA: Diagnosis not present
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
## 2020-11-05 NOTE — Patient Instructions (Signed)
D:  Patient completed MH-IOP today.  A:  Discharge today.  Follow up with Dr. Arlyn Leak and therapist of choice.  Encouraged support groups.  R: Pt receptive.
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
## 2020-11-06 ENCOUNTER — Other Ambulatory Visit: Payer: Self-pay

## 2020-11-06 ENCOUNTER — Other Ambulatory Visit (HOSPITAL_COMMUNITY): Payer: Medicare Other

## 2020-11-07 ENCOUNTER — Ambulatory Visit (HOSPITAL_COMMUNITY): Payer: Medicaid Other

## 2020-11-07 DIAGNOSIS — K120 Recurrent oral aphthae: Secondary | ICD-10-CM | POA: Diagnosis not present

## 2020-11-07 DIAGNOSIS — K219 Gastro-esophageal reflux disease without esophagitis: Secondary | ICD-10-CM | POA: Diagnosis not present

## 2020-11-10 ENCOUNTER — Ambulatory Visit (HOSPITAL_COMMUNITY): Payer: Medicaid Other

## 2020-11-11 ENCOUNTER — Ambulatory Visit (HOSPITAL_COMMUNITY): Payer: Medicaid Other

## 2020-11-12 ENCOUNTER — Ambulatory Visit (HOSPITAL_COMMUNITY): Payer: Medicaid Other

## 2020-11-13 ENCOUNTER — Ambulatory Visit (HOSPITAL_COMMUNITY): Payer: Medicaid Other

## 2020-11-13 ENCOUNTER — Ambulatory Visit (HOSPITAL_BASED_OUTPATIENT_CLINIC_OR_DEPARTMENT_OTHER): Payer: Medicare Other | Admitting: Cardiology

## 2020-11-14 ENCOUNTER — Ambulatory Visit (HOSPITAL_COMMUNITY): Payer: Medicaid Other

## 2020-11-17 NOTE — Psych (Signed)
Virtual Visit via Video Note  I connected with [name] on 10/01/20 at  9:00 AM EDT by a video enabled telemedicine application and verified that I am speaking with the correct person using two identifiers.  Location: Patient: patient home Provider: clinical home office   I discussed the limitations of evaluation and management by telemedicine and the availability of in person appointments. The patient expressed understanding and agreed to proceed.  I discussed the assessment and treatment plan with the patient. The patient was provided an opportunity to ask questions and all were answered. The patient agreed with the plan and demonstrated an understanding of the instructions.   The patient was advised to call back or seek an in-person evaluation if the symptoms worsen or if the condition fails to improve as anticipated.  Pt was provided 240 minutes of non-face-to-face time during this encounter.   Lorin Glass, LCSW    [name] [id number]  Session Time: 9:00 - 10:00  Participation Level: Active  Behavioral Response: CasualAlertAnxious  Type of Therapy: Group Therapy  Treatment Goals addressed: Coping  Interventions: CBT, DBT, Supportive, and Reframing  Summary: Clinician led check-in regarding current stressors and situation. Clinician utilized active listening and empathetic response and validated patient emotions. Clinician facilitated processing group on pertinent issues.   Therapist Response: STARLINA MUSALLAM is a 75 y.o. female who presents with depression and mood dysregulation symptoms. Patient arrived within time allowed and reports that she is feeling "depressed." Patient rates her mood at a 5 on a scale of 1-10 with 10 being great. Pt reports she felt ill last night and that disrupted her sleep. Pt reports struggling with lack of support. Pt engaged in discussion. Pt is labile throughout session.           Session Time: 10:00 - 11:00   Participation Level: Active   Behavioral Response: CasualAlertDepressed   Type of Therapy: Group Therapy   Treatment Goals addressed: Coping   Interventions: CBT, DBT, Supportive and Reframing   Summary: Cln led discussion on saying "no." Group members shared struggles they have with saying no and how it affects them. Cln utilized boundaries, communication, and self-esteem tenets to inform disussion.      Therapist Response: Pt engaged in discussion and reports increased understanding of how to say "no."            Session Time: 11:00 - 12:00   Participation Level: Active   Behavioral Response: CasualAlertDepressed   Type of Therapy: Group Therapy   Treatment Goals addressed: Coping   Interventions: Supportive, Reframing   Summary: Spiritual Care group   Therapist Response: Pt engaged in session. See chaplain note           Session Time: 12:00 -1:00   Participation Level: Active   Behavioral Response: CasualAlertDepressed   Type of Therapy: Group therapy   Treatment Goals addressed: Coping   Interventions: Psychosocial skills training, Supportive   Summary: 12:00 - 12:50: Occupational Therapy group 12:50 -1:00 Clinician led check-out. Clinician assessed for immediate needs, medication compliance and efficacy, and safety concerns   Therapist Response: 12:00 - 12:50: Patient engaged in group. See OT note.  12:50 - 1:00: At check-out, patient rates her mood at a 6 on a scale of 1-10 with 10 being great. Pt reports afternoon plans of going to an appointment. Pt demonstrates some progress as evidenced by improved mood throughout session. Patient denies SI/HI at the end of group.  Suicidal/Homicidal: Nowithout intent/plan  Plan: Pt will continue in PHP while working to decrease depression and anxiety symptoms, increase emotion regulation, and increase ability to manage symptoms in a healthy manner.   Diagnosis: Severe episode  of recurrent major depressive disorder, without psychotic features ([place]) [F33.2]    1. Severe episode of recurrent major depressive disorder, without psychotic features ([place])   2. GAD (generalized anxiety disorder)       Lorin Glass, LCSW 11/17/2020

## 2020-11-20 NOTE — Psych (Signed)
Virtual Visit via Video Note  I connected with [name] on 10/02/20 at  9:00 AM EDT by a video enabled telemedicine application and verified that I am speaking with the correct person using two identifiers.  Location: Patient: patient home Provider: clinical home office   I discussed the limitations of evaluation and management by telemedicine and the availability of in person appointments. The patient expressed understanding and agreed to proceed.  I discussed the assessment and treatment plan with the patient. The patient was provided an opportunity to ask questions and all were answered. The patient agreed with the plan and demonstrated an understanding of the instructions.   The patient was advised to call back or seek an in-person evaluation if the symptoms worsen or if the condition fails to improve as anticipated.  Pt was provided 240 minutes of non-face-to-face time during this encounter.   Lorin Glass, LCSW    [facility] BH PHP THERAPIST PROGRESS NOTE  [name] [id number]  Session Time: 9:00 - 10:00  Participation Level: Active  Behavioral Response: CasualAlertAnxious  Type of Therapy: Group Therapy  Treatment Goals addressed: Coping  Interventions: CBT, DBT, Supportive, and Reframing  Summary: Clinician led check-in regarding current stressors and situation. Clinician utilized active listening and empathetic response and validated patient emotions. Clinician facilitated processing group on pertinent issues.   Therapist Response: [name] is a 75 y.o. female who presents with depression and mood dysregulation symptoms. Patient arrived within time allowed and reports that she is feeling "restless." Patient rates her mood at a 6 on a scale of 1-10 with 10 being great. Pt reports she did not go to her medical appointment yesterday due to  feeling avoidant and difficulties with transportation. Pt able to problem solve ways to address transportation  issues. Pt engaged in discussion. Pt is labile ad disorganized throughout session.         Session Time: 10:00 - 11:00   Participation Level: Active   Behavioral Response: CasualAlertDepressed   Type of Therapy: Group Therapy   Treatment Goals addressed: Coping   Interventions: CBT, DBT, Supportive and Reframing   Summary: Cln led discussion on the connection between fear and anxiety. Cln contextualized anxiety and fear as both being future oriented and fed by the unknown. Group members shared ways in which fear interacts with their anxiety and the problems it creates. Cln encouraged CBT thought challenging and reframing as ways to address porblematic fears and anxieties.      Therapist Response: Pt engaged in discussion and is able to make connections between fear and anxiety.           Session Time: 11:00- 12:00   Participation Level: Active   Behavioral Response: CasualAlertDepressed   Type of Therapy: Group Therapy, OT   Treatment Goals addressed: Coping   Interventions: Psychosocial skills training, Supportive   Summary: Occupational Therapy group   Therapist Response: Patient engaged in group. See OT note.           Session Time: 12:00 -1:00   Participation Level: Active   Behavioral Response: CasualAlertDepressed   Type of Therapy: Group therapy   Treatment Goals addressed: Coping   Interventions: CBT; Solution focused; Supportive; Reframing   Summary: 12:00 - 12:50: Cln introduced CBT and the way in which it can provide context for addressing stumbling blocks. Group discussed "the problem is not the problem, the problem is how we're thinking about the problem" and tried to change perspective on current struggles.  12:50 -1:00  Clinician led check-out. Clinician assessed for immediate needs, medication compliance and efficacy, and safety concerns   Therapist Response: 12:50 - 1:00: Pt engaged in discussion and is able to attempt reframing using CBT.   12:50 - 1:00: At check-out, patient rates her mood at a 7 on a scale of 1-10 with 10 being great. Pt reports afternoon plans of taking time for herself. Pt demonstrates some progress as evidenced by willingness to share. Patient denies SI/HI at the end of group.   Suicidal/Homicidal: Nowithout intent/plan  Plan: Pt will continue in PHP while working to decrease depression and anxiety symptoms, increase emotion regulation, and increase ability to manage symptoms in a healthy manner.   Diagnosis: Severe episode of recurrent major depressive disorder, without psychotic features ([place]) [F33.2]    1. Severe episode of recurrent major depressive disorder, without psychotic features ([place])   2. GAD (generalized anxiety disorder)       Lorin Glass, LCSW 11/20/2020
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
## 2020-11-27 NOTE — Psych (Signed)
Virtual Visit via Video Note  I connected with [name] on 10/08/20 at  9:00 AM EDT by a video enabled telemedicine application and verified that I am speaking with the correct person using two identifiers.  Location: Patient: patient home Provider: clinical home office   I discussed the limitations of evaluation and management by telemedicine and the availability of in person appointments. The patient expressed understanding and agreed to proceed.  I discussed the assessment and treatment plan with the patient. The patient was provided an opportunity to ask questions and all were answered. The patient agreed with the plan and demonstrated an understanding of the instructions.   The patient was advised to call back or seek an in-person evaluation if the symptoms worsen or if the condition fails to improve as anticipated.  Pt was provided 120 minutes of non-face-to-face time during this encounter.   Lorin Glass, LCSW    [facility] BH PHP THERAPIST PROGRESS NOTE  [name] [id number]  Session Time: 9:00 - 10:00  Participation Level: Active  Behavioral Response: CasualAlertAnxious  Type of Therapy: Group Therapy  Treatment Goals addressed: Coping  Interventions: CBT, DBT, Supportive, and Reframing  Summary: Clinician led check-in regarding current stressors and situation. Clinician utilized active listening and empathetic response and validated patient emotions. Clinician facilitated processing group on pertinent issues.   Therapist Response: [name] is a 75 y.o. female who presents with depression and mood dysregulation symptoms. Patient arrived within time allowed and reports that she is feeling "so down." Patient rates her mood at a 2 on a scale of 1-10 with 10 being great. Pt reports feeling like she is not making progress. Pt reports struggles with medical issues and not getting answers she wants.  Pt is tearful, labile, and negative. Pt is not able to  constructively process.          Session Time: 10:00 - 11:00   Participation Level: Active   Behavioral Response: CasualAlertDepressed   Type of Therapy: Group Therapy   Treatment Goals addressed: Coping   Interventions: CBT, DBT, Supportive and Reframing   Summary: Cln led discussion on personal standards and they way in which it impacts the way we view ourselves and our abilties. Group members discussed judgment, struggles, and barriers they experience in terms of personal standards. Cln brought in topics of balance, grace, and kindness. Cln proposed the "best friend test" as a way to calibrate whether we are vieweing our situation with kindness or harshness.      Therapist Response: Pt engaged in discussion and is able to process. Pt improves with group interaction.     *Pt chose to leave group around 11:00 reporting need to rest. Pt denies SI/HI before leaving group.    Suicidal/Homicidal: Nowithout intent/plan  Plan: Pt will continue in PHP while working to decrease depression and anxiety symptoms, increase emotion regulation, and increase ability to manage symptoms in a healthy manner.   Diagnosis: Severe episode of recurrent major depressive disorder, without psychotic features ([place]) [F33.2]    1. Severe episode of recurrent major depressive disorder, without psychotic features ([place])   2. GAD (generalized anxiety disorder)       Lorin Glass, LCSW 11/27/2020

## 2020-11-28 NOTE — Psych (Signed)
Virtual Visit via Video Note  I connected with [name] on 10/09/20 at  9:00 AM EDT by a video enabled telemedicine application and verified that I am speaking with the correct person using two identifiers.  Location: Patient: patient home Provider: clinical home office   I discussed the limitations of evaluation and management by telemedicine and the availability of in person appointments. The patient expressed understanding and agreed to proceed.  I discussed the assessment and treatment plan with the patient. The patient was provided an opportunity to ask questions and all were answered. The patient agreed with the plan and demonstrated an understanding of the instructions.   The patient was advised to call back or seek an in-person evaluation if the symptoms worsen or if the condition fails to improve as anticipated.  Pt was provided 240 minutes of non-face-to-face time during this encounter.   Lorin Glass, LCSW    [facility] BH PHP THERAPIST PROGRESS NOTE  [name] [id number]  Session Time: 9:00 - 10:00  Participation Level: Active  Behavioral Response: CasualAlertAnxious  Type of Therapy: Group Therapy  Treatment Goals addressed: Coping  Interventions: CBT, DBT, Supportive, and Reframing  Summary: Clinician led check-in regarding current stressors and situation. Clinician utilized active listening and empathetic response and validated patient emotions. Clinician facilitated processing group on pertinent issues.   Therapist Response: [name] is a 75 y.o. female who presents with depression and mood dysregulation symptoms. Patient arrived within time allowed and reports that she is feeling "okay." Patient rates her mood at a 6 on a scale of 1-10 with 10 being great. Pt reports she felt ill yesterday and her bloop pressure has been high. Pt states she contacted her PCP re: the concern. Pt reports poor and broken sleep and having a panic attack early  this morning. Pt states yesterday she cooked and attempted to use skills. Pt reports she has not picked up her medication and did not take it last night. Pt states she will pick it up today. Pt engaged in discussion. Pt continues to be labile and disorganized throughout session.         Session Time: 10:00 - 11:00   Participation Level: Active   Behavioral Response: CasualAlertDepressed   Type of Therapy: Group Therapy   Treatment Goals addressed: Coping   Interventions: CBT, DBT, Supportive and Reframing   Summary: Cln led discussion on anxious behaviors. Group members shared patterns of anxiety they experience. Cln encouraged pt's to increase insight into the roots of their anxious behaviors to be able to address triggers.      Therapist Response: Pt engaged in discussion and reports increased insight into triggers.          Session Time: 11:00- 12:00   Participation Level: Active   Behavioral Response: CasualAlertDepressed   Type of Therapy: Group Therapy, OT   Treatment Goals addressed: Coping   Interventions: Psychosocial skills training, Supportive   Summary: Occupational Therapy group   Therapist Response: Patient engaged in group. See OT note.           Session Time: 12:00 -1:00   Participation Level: Active   Behavioral Response: CasualAlertDepressed   Type of Therapy: Group therapy   Treatment Goals addressed: Coping   Interventions: CBT; Solution focused; Supportive; Reframing   Summary: 12:00 - 12:50:Cln continued topic of DBT distress tolerance skills. Cln introduced Self-Soothe skills. Group discussed ways they can utilize the five senses to soothe themselves when struggling.  12:50 -1:00 Clinician led  check-out. Clinician assessed for immediate needs, medication compliance and efficacy, and safety concerns   Therapist Response: 12:50 - 1:00:  Pt engaged in discussion and reports most likely to use vision in self soothing.  12:50 - 1:00: At  check-out, patient rates her mood at a 9 on a scale of 1-10 with 10 being great. Pt reports afternoon plans of baking. Pt demonstrates some progress as evidenced by increased activity. Patient denies SI/HI at the end of group.   Suicidal/Homicidal: Nowithout intent/plan  Plan: Pt will continue in PHP while working to decrease depression and anxiety symptoms, increase emotion regulation, and increase ability to manage symptoms in a healthy manner.   Diagnosis: Severe episode of recurrent major depressive disorder, without psychotic features ([place]) [F33.2]    1. Severe episode of recurrent major depressive disorder, without psychotic features ([place])   2. GAD (generalized anxiety disorder)       Lorin Glass, LCSW 11/28/2020

## 2020-11-28 NOTE — Psych (Signed)
Virtual Visit via Video Note  I connected with [name] on 10/10/20 at  9:00 AM EDT by a video enabled telemedicine application and verified that I am speaking with the correct person using two identifiers.  Location: Patient: patient home Provider: clinical home office   I discussed the limitations of evaluation and management by telemedicine and the availability of in person appointments. The patient expressed understanding and agreed to proceed.  I discussed the assessment and treatment plan with the patient. The patient was provided an opportunity to ask questions and all were answered. The patient agreed with the plan and demonstrated an understanding of the instructions.   The patient was advised to call back or seek an in-person evaluation if the symptoms worsen or if the condition fails to improve as anticipated.  Pt was provided 240 minutes of non-face-to-face time during this encounter.   Lorin Glass, LCSW    [facility] BH PHP THERAPIST PROGRESS NOTE  [name] [id number]  Session Time: 9:00 - 10:00  Participation Level: Active  Behavioral Response: CasualAlertAnxious  Type of Therapy: Group Therapy  Treatment Goals addressed: Coping  Interventions: CBT, DBT, Supportive, and Reframing  Summary: Clinician led check-in regarding current stressors and situation. Clinician utilized active listening and empathetic response and validated patient emotions. Clinician facilitated processing group on pertinent issues.   Therapist Response: [name] is a 75 y.o. female who presents with depression and mood dysregulation symptoms. Patient arrived within time allowed and reports that she is feeling "anxious." Patient rates her mood at a 6 on a scale of 1-10 with 10 being great. Pt reports she picked up her medication however is anxious about taking it. Pt able to process and take later in group. Pt reports experiencing body pain and reaching out to her PCP.  Pt reports poor sleep.  Pt engaged in discussion. Pt continues to be labile and disorganized throughout session.         Session Time: 10:00 - 11:00   Participation Level: Active   Behavioral Response: CasualAlertDepressed   Type of Therapy: Group Therapy   Treatment Goals addressed: Coping   Interventions: CBT, DBT, Supportive and Reframing   Summary: Cln led discussion on control and the way it impacts our lives. Group members shared struggles and worked to identify the way in which control is contributing to the struggle. Cln utilized CBT thought challenging and the Catch-Challenge-Change model to address their control issues.      Therapist Response: Pt engaged in discussion and is able to determine ways in which control is an issue for her and brainstormed how to address it in a healthy manner.          Session Time: 11:00- 12:00   Participation Level: Active   Behavioral Response: CasualAlertDepressed   Type of Therapy: Group Therapy, OT   Treatment Goals addressed: Coping   Interventions: Psychosocial skills training, Supportive   Summary: Occupational Therapy group   Therapist Response: Patient engaged in group. See OT note.           Session Time: 12:00 -1:00   Participation Level: Active   Behavioral Response: CasualAlertDepressed   Type of Therapy: Group therapy   Treatment Goals addressed: Coping   Interventions: CBT; Solution focused; Supportive; Reframing   Summary: 12:00 - 12:50: Cln led discussion on ways to manage stressors and feelings over the weekend. Group members  brainstormed things to do over the weekend for multiple levels of energy, access, and moods. Cln reviewed  crisis services should they be needed and provided pt's with the text crisis line, mobile crisis, national suicide hotline, [facility] 24/7 line, and information on [facility].    12:50 -1:00 Clinician led check-out. Clinician assessed for immediate needs, medication compliance  and efficacy, and safety concerns   Therapist Response: 12:50 - 1:00: Pt engaged in discussion and is able to identify 3 ideas of what to do over the weekend to keep their mind engaged.  12:50 - 1:00: At check-out, patient rates her mood at a 8 on a scale of 1-10 with 10 being great. Pt reports afternoon plans of sitting outside. Pt demonstrates some progress as evidenced by increased mood. Patient denies SI/HI at the end of group.   Suicidal/Homicidal: Nowithout intent/plan  Plan: Pt will continue in PHP while working to decrease depression and anxiety symptoms, increase emotion regulation, and increase ability to manage symptoms in a healthy manner.   Diagnosis: Severe episode of recurrent major depressive disorder, without psychotic features ([place]) [F33.2]    1. Severe episode of recurrent major depressive disorder, without psychotic features ([place])   2. GAD (generalized anxiety disorder)       Lorin Glass, LCSW 11/28/2020

## 2020-12-01 ENCOUNTER — Ambulatory Visit (HOSPITAL_COMMUNITY)
Admission: RE | Admit: 2020-12-01 | Discharge: 2020-12-01 | Disposition: A | Discharge status: Home / Self Care | Payer: Medicare Other | Attending: Psychiatry | Admitting: Psychiatry

## 2020-12-01 DIAGNOSIS — F329 Major depressive disorder, single episode, unspecified: Secondary | ICD-10-CM | POA: Diagnosis present
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
## 2020-12-01 NOTE — H&P (Signed)
Behavioral Health Medical Screening Exam  [name] is a 75 y.o. female seen face to face by this provider with TTS counselor as voluntary walk-in at [facility] accompanied by no one. Patient reports she had a tele visit with her new psychiatrist today, told him she wants to be dead and he sent her here for crisis evaluation. Patient is sitting calmly in exam room, cooperative and calm. She has good eye contact and is tearful.  When asked about being suicidal, she reports she wants to be dead, but she would not kill herself, denies homicidal ideation, denies auditory and visual hallucinations. She reports she would be safe with outpatient services. Able to contract for safety today.   She lives with her boyfriend who has mental health issues and needs to get away from him. She reports she is planning to go to the [place] for a couple of days to get a break. Has lots of physical issues, recently had MRI and reports it was negative for tumor, follow-up with neuro in October to discuss results in detail.  Sleeps 2-3 hours at one time, appetite is decreased, has lost a few pounds. Denies substance and alcohol use.    Recently completed PHP and IOP programs in which she reports "did not help that much". PCP, Dr. Harrington Challenger, sees cardiology for hypertension management.   Received #30 Ativan from T. Bobby Rumpf, NP as a courtesy on 10/31/20. Patient did not ask for refill today. She has a friend who is supportive.   Total Time spent with patient: 45 minutes  Psychiatric Specialty Exam:  Presentation  General Appearance: Appropriate for Environment  Eye Contact:Good  Speech:Clear and Coherent; Normal Rate  Speech Volume:Normal  Handedness: No data recorded  Mood and Affect  Mood:Depressed  Affect:Depressed; Congruent   Thought Process  Thought Processes:Coherent; Goal Directed  Descriptions of Associations:Intact  Orientation:Full (Time, Place and Person)  Thought Content:Logical  History  of Schizophrenia/Schizoaffective disorder:No  Duration of Psychotic Symptoms:No data recorded Hallucinations:Hallucinations: None Ideas of Reference:None  Suicidal Thoughts:Suicidal Thoughts: No Homicidal Thoughts:Homicidal Thoughts: No  Sensorium  Memory:Immediate Good; Recent Good; Remote Good  Judgment:Good  Insight:Good   Executive Functions  Concentration:Good  Attention Span:Good  [place] of Knowledge:Good  Language:Good   Psychomotor Activity  Psychomotor Activity: Psychomotor Activity: Normal  Assets  Assets:Communication Skills; Desire for Improvement; Financial Resources/Insurance; Housing; Leisure Time; Intimacy; Social Support; Transportation   Sleep  Sleep: Sleep: Poor Number of Hours of Sleep: 3   Physical Exam: Physical Exam Vitals reviewed.  Constitutional:      General: She is not in acute distress.    Appearance: Normal appearance. She is obese. She is not ill-appearing, toxic-appearing or diaphoretic.  Cardiovascular:     Rate and Rhythm: Bradycardia present.  Pulmonary:     Effort: Pulmonary effort is normal.     Breath sounds: Normal breath sounds.  Skin:    General: Skin is warm and dry.  Neurological:     Mental Status: She is alert and oriented to person, place, and time.  Psychiatric:        Attention and Perception: Attention normal.        Mood and Affect: Mood is depressed. Affect is tearful.        Speech: Speech normal.        Behavior: Behavior normal. Behavior is cooperative.        Thought Content: Thought content is not paranoid or delusional. Thought content does not include homicidal or suicidal ideation. Thought  content does not include homicidal or suicidal plan.   Review of Systems  Constitutional:  Negative for chills and fever.  Respiratory:  Negative for shortness of breath.   Cardiovascular:  Negative for chest pain.  Gastrointestinal:  Positive for nausea. Negative for abdominal pain and  vomiting.  Neurological:  Positive for headaches (history of migraines). Negative for weakness.  Psychiatric/Behavioral:  Positive for depression. Negative for hallucinations, substance abuse and suicidal ideas. The patient has insomnia. The patient is not nervous/anxious.   Blood pressure 129/76, pulse (!) 55, temperature 98 F (36.7 C), temperature source Oral, resp. rate 16, SpO2 100 %. There is no height or weight on file to calculate BMI.  Musculoskeletal: Strength & Muscle Tone: within normal limits Gait & Station: normal Patient leans: N/A   Recommendations:  Based on my evaluation the patient does not appear to have an emergency medical condition. Safe for outpatient psychiatric treatment. Has psychiatrist in place, wants therapist. [place] information provided per TTS counselor. Agrees with plan to follow-up as outpatient. Encouraged patient to go to [facility], join [employer] to make friends and get physical exercise. This patient would benefit from social interaction.   Chalmers Guest, NP 12/01/2020, 6:15 PM

## 2020-12-03 DIAGNOSIS — Z23 Encounter for immunization: Secondary | ICD-10-CM | POA: Diagnosis not present

## 2020-12-03 DIAGNOSIS — R1011 Right upper quadrant pain: Secondary | ICD-10-CM | POA: Diagnosis not present

## 2020-12-03 DIAGNOSIS — K8020 Calculus of gallbladder without cholecystitis without obstruction: Secondary | ICD-10-CM | POA: Diagnosis not present

## 2020-12-04 ENCOUNTER — Telehealth: Payer: Self-pay | Admitting: Cardiology

## 2020-12-04 DIAGNOSIS — M5442 Lumbago with sciatica, left side: Secondary | ICD-10-CM | POA: Diagnosis not present

## 2020-12-04 NOTE — Telephone Encounter (Signed)
Pt reporting her blood pressure continues to remain elevated despite taking both Hyzaar 50-12.5 mg and metoprolol 25 mg BID. Pt also report on occasions she has taken two hyzaar but nothing seems to help. She also report experiencing headaches almost daily but denies symptom at this time.  164/71 150/72 140/82 148/80 180/88  Will forward to Pharm D and MD for recommendations

## 2020-12-04 NOTE — Telephone Encounter (Signed)
Pt c/o BP issue: STAT if pt c/o blurred vision, one-sided weakness or slurred speech  1. What are your last 5 BP readings?   164/71 150/72 140/82 148/80 180/88  2. Are you having any other symptoms (ex. Dizziness, headache, blurred vision, passed out)?  Headaches   3. What is your BP issue?   Patient states her BP has been elevated.

## 2020-12-05 NOTE — Telephone Encounter (Signed)
Left message to call back  

## 2020-12-05 NOTE — Telephone Encounter (Signed)
Her blood pressure was elevated on her home cuff but at goal when she came into the office. Can we get her set up with a visit to check her home cuff against ours? The other issue is that if she is in significant pain, her BP will be elevated. If her BP cuff matches ours, then we will need to add medication (likely amlodipine) to get better control. Thanks.

## 2020-12-08 NOTE — Telephone Encounter (Signed)
Pt is returning call.  

## 2020-12-08 NOTE — Telephone Encounter (Signed)
Returned call to patient left message on personal voice mail to call back. 
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
## 2020-12-09 DIAGNOSIS — H2511 Age-related nuclear cataract, right eye: Secondary | ICD-10-CM | POA: Diagnosis not present

## 2020-12-09 DIAGNOSIS — H18413 Arcus senilis, bilateral: Secondary | ICD-10-CM | POA: Diagnosis not present

## 2020-12-09 DIAGNOSIS — H25043 Posterior subcapsular polar age-related cataract, bilateral: Secondary | ICD-10-CM | POA: Diagnosis not present

## 2020-12-09 DIAGNOSIS — H25013 Cortical age-related cataract, bilateral: Secondary | ICD-10-CM | POA: Diagnosis not present

## 2020-12-09 DIAGNOSIS — H2513 Age-related nuclear cataract, bilateral: Secondary | ICD-10-CM | POA: Diagnosis not present

## 2020-12-12 NOTE — Telephone Encounter (Signed)
Patient was returning call. Please advise ?

## 2020-12-16 NOTE — Telephone Encounter (Signed)
LM FOR PT TO CALL BACK WILL AWAIT A RETURN CALL AS THIS MESSAGE IS OVER A WEEK OLD .Adonis Housekeeper

## 2020-12-25 ENCOUNTER — Ambulatory Visit (INDEPENDENT_AMBULATORY_CARE_PROVIDER_SITE_OTHER): Payer: Medicare Other | Admitting: Otolaryngology

## 2020-12-30 DIAGNOSIS — E039 Hypothyroidism, unspecified: Secondary | ICD-10-CM | POA: Diagnosis not present

## 2020-12-30 DIAGNOSIS — G4700 Insomnia, unspecified: Secondary | ICD-10-CM | POA: Diagnosis not present

## 2020-12-30 DIAGNOSIS — K219 Gastro-esophageal reflux disease without esophagitis: Secondary | ICD-10-CM | POA: Diagnosis not present

## 2020-12-30 DIAGNOSIS — I10 Essential (primary) hypertension: Secondary | ICD-10-CM | POA: Diagnosis not present

## 2020-12-30 DIAGNOSIS — E7800 Pure hypercholesterolemia, unspecified: Secondary | ICD-10-CM | POA: Diagnosis not present

## 2020-12-30 DIAGNOSIS — R944 Abnormal results of kidney function studies: Secondary | ICD-10-CM | POA: Diagnosis not present

## 2020-12-30 DIAGNOSIS — I2510 Atherosclerotic heart disease of native coronary artery without angina pectoris: Secondary | ICD-10-CM | POA: Diagnosis not present

## 2021-01-01 ENCOUNTER — Ambulatory Visit: Payer: Medicare Other | Admitting: Oncology

## 2021-01-06 ENCOUNTER — Other Ambulatory Visit (HOSPITAL_COMMUNITY): Payer: Self-pay | Admitting: Family

## 2021-01-08 DIAGNOSIS — M25561 Pain in right knee: Secondary | ICD-10-CM | POA: Diagnosis not present

## 2021-01-13 NOTE — Progress Notes (Signed)
NEUROLOGY CONSULTATION NOTE  [name] MRN: [id number] DOB: 1946/03/21  Referring provider: Lawerance Cruel, MD Primary care provider: Lawerance Cruel, MD  Reason for consult:  balance issues, weakness, facial droop  Assessment/Plan:   Balance disorder - suspect multifactorial due to polyarthralgia, possible underlying neuropathy and body habitus.  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  I do not suspect underlying neurodegenerative disease and she does not have multiple sclerosis.   Facial asymmetry.  I do not have an explanation for this.  I have not observed it and MRI of brain negative for acute findings.    1  Since the neuropathy symptoms have started over the past 6 months, and she has no history of diabetes, will perform workup - NCV-EMG lower extremities - Check B12 level now.  If NCV confirms electrodiagnostic evidence of a polyneuropathy, will add other labs 2  Patient has an order for physical therapy by her orthopedist.  Recommend the PT as it is likely the best therapy to help with her balance. 3  Follow up after testing.   Subjective:  [name] is a 75 year old female with CAD, HTN, hypothyroidism, anxiety, PTSD and CKD with history of kidney cancer who presents for balance problems, facial asymmetry.Marland Kitchen  History supplemented by prior neurologist's note, referring provider's note and neuropsychological evaluation.    Patient has history of vertigo.  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  She has history of reported brain fog and short term memory problems since at least 2011.  MRI of brain with and without contrast on 02/12/2010 to assess confusion was personally reviewed and was unremarkable, as was a subsequent MRI of brain without contrast, personally reviewed, from 04/20/2013.  Neuropsychological evaluation on 08/22/2020 demonstrated  some isolated impairments but overall unremarkable with findings likely related to her psychiatric comorbidities (ADHD, depression, anxiety) rather than underlying neurocognitive disorder.  She reports balance problems over the past year.  She feels weak in the legs.  However, she has history of chronic pain in the joints, such as the hips and knees.  She has arthritis in the right knee.  Over the past year, she reports numbness and tingling as well as pain in the feet and tingling and numbness in the hands.  She has acquired hypothyroidism which is treated and monitored.  TSH from June 2022 was 1.485.  She has low back pain and aching in the legs.  Denies neck pain.  Over the summer, it looked like that she may have had mild facial asymmetry, with slight droop on the right.  MRI of brain without contrast on 11/05/2020 personally reviewed showed no acute intracranial abnormality.  It has since resolved.  She reports one other episode in the past.  She is concerned about her balance and leg weakness because her sister, who has MS, has similar symptoms.   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 FFR of LAD  not performed because of difficulty with catheter control from the right radial.    Chronic cough 02/22/2020   Chronic kidney disease    hx of kidney cancer   Diastolic dysfunction, left ventricle 05/31/2013   Difficult intubation    told by [facility] that she was hard to intubate 15 yrs ago in  [place]- surgery since then no problems   Dyspnea    Essential hypertension 05/31/2013   Generalized anxiety disorder    Adequate for discharge    GERD (gastroesophageal reflux disease)    History of attention deficit disorder    History of echocardiogram 07/2012   Normal LVF w grade I siastolic dysfunction    History of endometriosis    History of gall stones 12/31/2009   History of pleural effusion    Hyperlipidemia 06/24/2016   LDL goal <70   Hypertension    Hypothyroidism 1990   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 Elsworth Soho discussed inspire device, patient would need to lose weight   PAT (paroxysmal atrial tachycardia)    s/p ablation   PONV (postoperative nausea and vomiting)    PTSD (post-traumatic stress disorder)    Pure hypercholesterolemia 02/18/2019   PVC's (premature ventricular contractions)    Renal mass 02/15/2012   Restless legs syndrome (RLS) 02/20/2007   Vertigo     PAST SURGICAL HISTORY: Past Surgical History:  Procedure Laterality Date   CARDIAC ELECTROPHYSIOLOGY MAPPING AND ABLATION  2000s   LAPAROSCOPIC NEPHRECTOMY, HAND ASSISTED Right 07/31/2019   Procedure: HAND ASSISTED LAPAROSCOPIC NEPHRECTOMY CONVERTED TO OPEN WITH REPAIR OF INFERIOR VENA CAVAOTOMY;  Surgeon: Robley Fries, MD;  Location: [facility];  Service: Urology;  Laterality: Right;  3 HRS   laparoscopy     LEFT HEART CATH AND CORONARY ANGIOGRAPHY N/A 06/08/2016   Procedure: Left Heart Cath and Coronary Angiography;  Surgeon: Belva Crome, MD;  Location: [facility];  Service: Cardiovascular;  Laterality: N/A;   nasoseptal reconstruction  1990s    NEPHRECTOMY RECIPIENT Right 08/2019   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   [place]     as a child   TUBAL LIGATION  1970s   uterine mass removal  03/2012   was found to be benign    MEDICATIONS: Current Outpatient Medications on File Prior to Visit  Medication Sig Dispense Refill   acetaminophen (TYLENOL) 500 MG tablet Take 500-1,000 mg by mouth every 6 (six) hours as needed for mild pain (or headaches).     albuterol (VENTOLIN HFA) 108 (90 Base) MCG/ACT inhaler Inhale 2 puffs into the lungs every 6 (six) hours as needed for wheezing or shortness of breath. (Patient not taking: Reported on 10/20/2020)     b complex vitamins tablet Take 1 tablet by mouth daily.     Cholecalciferol (VITAMIN D3) 125 MCG (5000 UT) CAPS Take 5,000 Units by mouth in the morning.     diphenhydrAMINE (BENADRYL) 25 mg capsule Take 25 mg by mouth every 6 (six) hours as needed for allergies.  ipratropium (ATROVENT) 0.06 % nasal spray Place 2 sprays into both nostrils in the morning.     levothyroxine (SYNTHROID) 88 MCG tablet Take 88 mcg by mouth daily before breakfast.     LORazepam (ATIVAN) 0.5 MG tablet Take 1 tablet (0.5 mg total) by mouth daily as needed for anxiety. 30 tablet 0   losartan-hydrochlorothiazide (HYZAAR) 50-12.5 MG tablet Take 1 tablet by mouth 2 (two) times daily. 180 tablet 3   metoprolol tartrate (LOPRESSOR) 25 MG tablet Take 25 mg by mouth 2 (two) times daily.     MINERAL ICE 2 % GEL Apply 1 application topically at bedtime as needed (to painful sites).     Omega 3 1000 MG CAPS Take 1,000 mg by mouth daily.     ondansetron (ZOFRAN-ODT) 8 MG disintegrating tablet Take 8 mg by mouth every 8 (eight) hours as needed for nausea or vomiting (dissolve orally). (Patient not taking: Reported on 10/20/2020)     polyethylene glycol powder (GLYCOLAX/MIRALAX) 17 GM/SCOOP powder Take 17 g by mouth See admin instructions. Mix 17 grams of powder into a fruit smoothie and drink every  morning     predniSONE (DELTASONE) 20 MG tablet Take 1 tablet twice a day for 5 days and then 1 tablet daily for 5 days     rOPINIRole (REQUIP) 0.5 MG tablet TAKE 1 TABLET BY MOUTH EVERY DAY AFTER SUPPER AND TAKE 2 TABLETS AT BEDTIME 90 tablet 1   rosuvastatin (CRESTOR) 5 MG tablet Take 1 tablet (5 mg total) by mouth daily. 90 tablet 3   SALONPAS 3.03-27-08 % PTCH Apply 1 patch topically See admin instructions. Apply 1 patch to affected sites one to two times a day as needed for pain     traZODone (DESYREL) 100 MG tablet Take 1 tablet (100 mg total) by mouth at bedtime as needed for sleep. 30 tablet 0   vitamin C (ASCORBIC ACID) 500 MG tablet Take 500 mg by mouth daily.     No current facility-administered medications on file prior to visit.    ALLERGIES: Allergies  Allergen Reactions   Geodon [Ziprasidone Hydrochloride] Other (See Comments)    Extremely agitated   Lithium Nausea Only and Other (See Comments)    Off balance, increased heart rate   Talwin [Pentazocine] Other (See Comments)    Hallucinations    Toradol [Ketorolac Tromethamine] Other (See Comments)    Chest pains   Ziprasidone Other (See Comments)    Extreme agitation   Abilify [Aripiprazole] Other (See Comments)    jerking   Compazine [Prochlorperazine Edisylate] Nausea And Vomiting   Latuda [Lurasidone Hcl] Other (See Comments)    Reports made her mind race more and made her irritable    Pristiq [Desvenlafaxine Succinate Er] Other (See Comments)    Did not work, prefers not to take   Rexulti [Brexpiprazole] Other (See Comments) and Hypertension    Elevated BP, created aggression   Diflucan [Fluconazole] Nausea And Vomiting    FAMILY HISTORY: Family History  Problem Relation Age of Onset   Allergies Father    Skin cancer Father    Alcohol abuse Father    Mental illness Father    Heart disease Mother    Depression Mother    Hypertension Mother    CAD Mother    Colon cancer Paternal Grandmother    OCD  Paternal Grandmother    Multiple sclerosis Sister    Autoimmune disease Brother        unknown type   Allergies  Daughter    Heart disease Maternal Grandfather    Cancer Paternal Grandfather    Parkinson's disease Paternal Grandfather    Mental illness Maternal Grandmother     Objective:  Blood pressure 136/81, pulse 71, height 5\' 4"  (1.626 m), weight 227 lb 12.8 oz (103.3 kg), SpO2 99 %. General: No acute distress.  Patient appears well-groomed.   Head:  Normocephalic/atraumatic Eyes:  fundi examined but not visualized Neck: supple, no paraspinal tenderness, full range of motion Back: No paraspinal tenderness Heart: regular rate and rhythm Lungs: Clear to auscultation bilaterally. Vascular: No carotid bruits. Neurological Exam: Mental status: alert and oriented to person, place, and time, recent and remote memory intact, fund of knowledge intact, attention and concentration intact, speech fluent and not dysarthric, language intact. Cranial nerves: CN I: not tested CN II: pupils equal, round and reactive to light, visual fields intact CN III, IV, VI:  full range of motion, no nystagmus, no ptosis CN V: facial sensation intact. CN VII: upper and lower face symmetric CN VIII: hearing intact CN IX, X: gag intact, uvula midline CN XI: sternocleidomastoid and trapezius muscles intact CN XII: tongue midline Bulk & Tone: normal, no fasciculations. Motor:  muscle strength 5/5 throughout Sensation:  Pinprick and vibratory sensation sensation slightly reduced in toes Deep Tendon Reflexes:  absent throughout,  toes downgoing.   Finger to nose testing:  Without dysmetria.     Gait:  Broad-based waddling gait.  Romberg negative.    Thank you for allowing me to take part in the care of this patient.  Metta Clines, DO  CC: C. Melinda Crutch, MD

## 2021-01-14 DIAGNOSIS — R1905 Periumbilic swelling, mass or lump: Secondary | ICD-10-CM | POA: Diagnosis not present

## 2021-01-14 DIAGNOSIS — Z85528 Personal history of other malignant neoplasm of kidney: Secondary | ICD-10-CM | POA: Diagnosis not present

## 2021-01-15 ENCOUNTER — Encounter: Payer: Self-pay | Admitting: Neurology

## 2021-01-15 ENCOUNTER — Ambulatory Visit (INDEPENDENT_AMBULATORY_CARE_PROVIDER_SITE_OTHER): Payer: Medicare Other | Admitting: Neurology

## 2021-01-15 ENCOUNTER — Other Ambulatory Visit (INDEPENDENT_AMBULATORY_CARE_PROVIDER_SITE_OTHER): Payer: Medicare Other

## 2021-01-15 ENCOUNTER — Other Ambulatory Visit: Payer: Self-pay

## 2021-01-15 VITALS — BP 136/81 | HR 71 | Ht 64.0 in | Wt 227.8 lb

## 2021-01-15 DIAGNOSIS — R2681 Unsteadiness on feet: Secondary | ICD-10-CM

## 2021-01-15 LAB — VITAMIN B12: Vitamin B-12: 938 pg/mL — ABNORMAL HIGH (ref 211–911)

## 2021-01-15 NOTE — Patient Instructions (Signed)
I think that your balance may be a combination of joint pain and neuropathy Will check B12 level and order nerve conduction study of lower extremities.  Further recommendations pending results.  Follow up afterwards.

## 2021-01-19 ENCOUNTER — Other Ambulatory Visit: Payer: Self-pay | Admitting: Family Medicine

## 2021-01-19 DIAGNOSIS — R1905 Periumbilic swelling, mass or lump: Secondary | ICD-10-CM

## 2021-01-23 ENCOUNTER — Ambulatory Visit
Admission: RE | Admit: 2021-01-23 | Discharge: 2021-01-23 | Disposition: A | Discharge status: Home / Self Care | Payer: Medicare Other | Source: Ambulatory Visit | Attending: Family Medicine | Admitting: Family Medicine

## 2021-01-23 ENCOUNTER — Other Ambulatory Visit: Payer: Self-pay

## 2021-01-23 DIAGNOSIS — K5730 Diverticulosis of large intestine without perforation or abscess without bleeding: Secondary | ICD-10-CM | POA: Diagnosis not present

## 2021-01-23 DIAGNOSIS — N281 Cyst of kidney, acquired: Secondary | ICD-10-CM | POA: Diagnosis not present

## 2021-01-23 DIAGNOSIS — K8020 Calculus of gallbladder without cholecystitis without obstruction: Secondary | ICD-10-CM | POA: Diagnosis not present

## 2021-01-23 DIAGNOSIS — R1905 Periumbilic swelling, mass or lump: Secondary | ICD-10-CM

## 2021-01-23 NOTE — Progress Notes (Signed)
LMOVM to call the office back.

## 2021-01-26 NOTE — Progress Notes (Signed)
2nd attempt to call pt to give lab results. No answer. LMOVM.

## 2021-01-27 NOTE — Progress Notes (Signed)
Pt advised of her B12 level results.

## 2021-02-05 ENCOUNTER — Encounter: Payer: Medicare Other | Admitting: Neurology

## 2021-02-05 DIAGNOSIS — K429 Umbilical hernia without obstruction or gangrene: Secondary | ICD-10-CM | POA: Diagnosis not present

## 2021-02-05 DIAGNOSIS — E039 Hypothyroidism, unspecified: Secondary | ICD-10-CM | POA: Diagnosis not present

## 2021-02-05 DIAGNOSIS — R2689 Other abnormalities of gait and mobility: Secondary | ICD-10-CM | POA: Diagnosis not present

## 2021-02-05 DIAGNOSIS — R110 Nausea: Secondary | ICD-10-CM | POA: Diagnosis not present

## 2021-02-05 DIAGNOSIS — R5381 Other malaise: Secondary | ICD-10-CM | POA: Diagnosis not present

## 2021-02-07 DIAGNOSIS — I2510 Atherosclerotic heart disease of native coronary artery without angina pectoris: Secondary | ICD-10-CM | POA: Diagnosis not present

## 2021-02-07 DIAGNOSIS — E7800 Pure hypercholesterolemia, unspecified: Secondary | ICD-10-CM | POA: Diagnosis not present

## 2021-02-07 DIAGNOSIS — I10 Essential (primary) hypertension: Secondary | ICD-10-CM | POA: Diagnosis not present

## 2021-02-07 DIAGNOSIS — E039 Hypothyroidism, unspecified: Secondary | ICD-10-CM | POA: Diagnosis not present

## 2021-02-07 DIAGNOSIS — K219 Gastro-esophageal reflux disease without esophagitis: Secondary | ICD-10-CM | POA: Diagnosis not present

## 2021-02-07 DIAGNOSIS — G4700 Insomnia, unspecified: Secondary | ICD-10-CM | POA: Diagnosis not present

## 2021-02-23 DIAGNOSIS — K432 Incisional hernia without obstruction or gangrene: Secondary | ICD-10-CM | POA: Diagnosis not present

## 2021-02-26 ENCOUNTER — Other Ambulatory Visit: Payer: Self-pay

## 2021-02-26 ENCOUNTER — Ambulatory Visit (INDEPENDENT_AMBULATORY_CARE_PROVIDER_SITE_OTHER): Payer: Medicare Other | Admitting: Adult Health

## 2021-02-26 ENCOUNTER — Encounter: Payer: Self-pay | Admitting: Adult Health

## 2021-02-26 DIAGNOSIS — G4733 Obstructive sleep apnea (adult) (pediatric): Secondary | ICD-10-CM | POA: Diagnosis not present

## 2021-02-26 DIAGNOSIS — G2581 Restless legs syndrome: Secondary | ICD-10-CM | POA: Diagnosis not present

## 2021-02-26 NOTE — Progress Notes (Signed)
@Patient  ID: [name], female    DOB: 1945-10-27, 75 y.o.   MRN: [id number]  Chief Complaint  Patient presents with   Follow-up    Referring provider: Leighton Ruff, MD  HPI: 75 year old female never smoker followed with restless leg syndrome and obstructive sleep apnea-CPAP intolerant  Medical history significant for Bipolar depression and Anxiety   TEST/EVENTS :  NPSG 2004:  AHI 10/hr PSG 07/2015 (214 lbs)  AHI 23/h, mild PLMs 23/h but no sig arousals   CPAP titration 09/2015 9 cm   HST 11/2016 AHI 12/h  Chest x-ray Jul 22, 2020 showed clear lungs  Cough w/up :  Retired Recruitment consultant admin work.   1 indoor cat. No birds/chickens. No Basement /hot tub . No travel . Paints with acrylic paints- but rarely.  From [place] . Lived locally x 61yrs  No amio/mtx use.  History of Kidney cancer s/p resection on right  2021.   02/26/2021 Follow up : RLS , OSA , Chronic cough  Patient returns for a 47-month follow-up.  Patient is followed for restless leg syndrome.  She remains on Requip.  Says overall feels that this is under control.  She does have  mild OSA.  Has been intolerant to CPAP in the past. Has tried CPAP on 2 separate occasions but unable to tolerate.  Tried oral appliance in past but did not tolerate. Discussed INspire device in past but BMI is 40.  Long discussion regarding CPAP , oral appliance and Inspire. She has been unsucessfull in past and is not candidate for Inspire due to BMI   Last visit patient was having ongoing cough and shortness of breath.  She been treated with several courses of antibiotics and steroids for acute bronchitis.  Chest x-ray Jul 22, 2020 showed clear lungs. Last visit patient was recommended to begin Zyrtec daily.  To use Delsym and Tessalon for cough control.  She was started on PPI which she feels really helped her cough. Since last visit patient is feeling much better and cough has resolved.   Allergies  Allergen Reactions   Geodon  [Ziprasidone Hydrochloride] Other (See Comments)    Extremely agitated   Lithium Nausea Only and Other (See Comments)    Off balance, increased heart rate   Talwin [Pentazocine] Other (See Comments)    Hallucinations    Toradol [Ketorolac Tromethamine] Other (See Comments)    Chest pains   Ziprasidone Other (See Comments)    Extreme agitation   Abilify [Aripiprazole] Other (See Comments)    jerking   Compazine [Prochlorperazine Edisylate] Nausea And Vomiting   Latuda [Lurasidone Hcl] Other (See Comments)    Reports made her mind race more and made her irritable    Pristiq [Desvenlafaxine Succinate Er] Other (See Comments)    Did not work, prefers not to take   Verdigris [Brexpiprazole] Other (See Comments) and Hypertension    Elevated BP, created aggression   Diflucan [Fluconazole] Nausea And Vomiting    Immunization History  Administered Date(s) Administered   Influenza Whole 12/21/2007, 03/22/2009   Influenza, High Dose Seasonal PF 11/15/2017, 05/26/2018, 01/19/2019, 12/03/2020   Influenza,inj,Quad PF,6+ Mos 12/23/2010, 01/20/2013, 05/22/2015, 11/29/2016   Influenza-Unspecified 12/13/2012, 12/20/2013, 12/04/2015   PFIZER(Purple Top)SARS-COV-2 Vaccination 07/02/2019, 07/23/2019   Pneumococcal Conjugate-13 10/15/2015   Pneumococcal Polysaccharide-23 03/04/2004, 04/21/2013, 08/01/2019   Tdap 01/23/2010   Zoster, Live 03/02/2012, 12/20/2012    Past Medical History:  Diagnosis Date   Abscess of Bartholin's gland    Acquired hypothyroidism 1990  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 FFR  of LAD  not performed because of difficulty with catheter control from the right radial.    Chronic cough 02/22/2020   Chronic kidney disease    hx of kidney cancer   Diastolic dysfunction, left ventricle 05/31/2013   Difficult intubation    told by [facility] that she was hard to intubate 15 yrs ago in [place]- surgery since then no problems   Dyspnea    Essential hypertension 05/31/2013   Generalized anxiety disorder    Adequate for discharge    GERD (gastroesophageal reflux disease)    History of attention deficit disorder    History of echocardiogram 07/2012   Normal LVF w grade I siastolic dysfunction    History of endometriosis    History of gall stones 12/31/2009   History of pleural effusion    Hyperlipidemia 06/24/2016   LDL goal <70   Hypertension    Hypothyroidism 1990   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 Elsworth Soho discussed inspire device, patient would need to lose weight   PAT (paroxysmal atrial tachycardia)    s/p ablation   PONV (postoperative nausea and vomiting)    PTSD (post-traumatic stress disorder)    Pure hypercholesterolemia 02/18/2019   PVC's (premature ventricular contractions)    Renal mass 02/15/2012   Restless legs syndrome (RLS) 02/20/2007   Vertigo     Tobacco History: Social History   Tobacco Use  Smoking Status Never  Smokeless Tobacco Never   Counseling given: Not Answered   Outpatient Medications Prior to Visit  Medication Sig Dispense Refill   acetaminophen (TYLENOL) 500 MG tablet Take 500-1,000 mg by mouth every 6 (six) hours as needed for mild pain (or headaches).     b complex vitamins  tablet Take 1 tablet by mouth daily.     Cholecalciferol (VITAMIN D3) 125 MCG (5000 UT) CAPS Take 5,000 Units by mouth in the morning.     diphenhydrAMINE (BENADRYL) 25 mg capsule Take 25 mg by mouth every 6 (six) hours as needed for allergies.     ipratropium (ATROVENT) 0.06 % nasal spray Place 2 sprays into both nostrils in the morning.     levothyroxine (SYNTHROID) 100 MCG tablet Take 100 mcg by mouth daily before breakfast.     LORazepam (ATIVAN) 0.5 MG tablet Take 1 tablet (0.5 mg total) by mouth daily as needed for anxiety. 30 tablet 0   losartan-hydrochlorothiazide (HYZAAR) 50-12.5 MG tablet Take 1 tablet by mouth 2 (two) times daily. 180 tablet 3   metoprolol tartrate (LOPRESSOR) 25 MG tablet Take 25 mg by mouth 2 (two) times daily.     MINERAL ICE 2 % GEL Apply 1 application topically at bedtime as  needed (to painful sites).     Omega 3 1000 MG CAPS Take 1,000 mg by mouth daily.     ondansetron (ZOFRAN-ODT) 8 MG disintegrating tablet Take 8 mg by mouth every 8 (eight) hours as needed for nausea or vomiting (dissolve orally).     polyethylene glycol powder (GLYCOLAX/MIRALAX) 17 GM/SCOOP powder Take 17 g by mouth See admin instructions. Mix 17 grams of powder into a fruit smoothie and drink every morning     rOPINIRole (REQUIP) 0.5 MG tablet TAKE 1 TABLET BY MOUTH EVERY DAY AFTER SUPPER AND TAKE 2 TABLETS AT BEDTIME 90 tablet 1   SALONPAS 3.03-27-08 % PTCH Apply 1 patch topically See admin instructions. Apply 1 patch to affected sites one to two times a day as needed for pain     vitamin C (ASCORBIC ACID) 500 MG tablet Take 500 mg by mouth daily.     albuterol (VENTOLIN HFA) 108 (90 Base) MCG/ACT inhaler Inhale 2 puffs into the lungs every 6 (six) hours as needed for wheezing or shortness of breath. (Patient not taking: Reported on 02/26/2021)     rosuvastatin (CRESTOR) 5 MG tablet Take 1 tablet (5 mg total) by mouth daily. 90 tablet 3   traMADol (ULTRAM) 50 MG tablet SMARTSIG:0.5-1 Tablet(s) By  Mouth 2-3 Times Daily PRN     DULoxetine (CYMBALTA) 30 MG capsule Take 30 mg by mouth daily. (Patient not taking: Reported on 02/26/2021)     levothyroxine (SYNTHROID) 88 MCG tablet Take 88 mcg by mouth daily before breakfast. (Patient not taking: Reported on 02/26/2021)     traZODone (DESYREL) 100 MG tablet Take 1 tablet (100 mg total) by mouth at bedtime as needed for sleep. (Patient not taking: Reported on 02/26/2021) 30 tablet 0   No facility-administered medications prior to visit.     Review of Systems:   Constitutional:   No  weight loss, night sweats,  Fevers, chills,  +fatigue, or  lassitude.  HEENT:   No headaches,  Difficulty swallowing,  Tooth/dental problems, or  Sore throat,                No sneezing, itching, ear ache, nasal congestion, post nasal drip,   CV:  No chest pain,  Orthopnea, PND, swelling in lower extremities, anasarca, dizziness, palpitations, syncope.   GI  No heartburn, indigestion, abdominal pain, nausea, vomiting, diarrhea, change in bowel habits, loss of appetite, bloody stools.   Resp:   No chest wall deformity  Skin: no rash or lesions.  GU: no dysuria, change in color of urine, no urgency or frequency.  No flank pain, no hematuria   MS:  No joint pain or swelling.  No decreased range of motion.  No back pain.    Physical Exam  BP 110/70 (BP Location: Left Arm, Patient Position: Sitting, Cuff Size: Normal)   Pulse 79   Temp 98.2 F (36.8 C) (Oral)   Ht 5\' 4"  (1.626 m)   Wt 235 lb (106.6 kg)   SpO2 96%   BMI 40.34 kg/m   GEN: A/Ox3; pleasant , NAD, well nourished    HEENT:  [place]/AT,  NOSE-clear, THROAT-clear, no lesions, no postnasal drip or exudate noted.   NECK:  Supple w/ fair ROM; no JVD; normal carotid impulses w/o bruits; no thyromegaly or nodules palpated; no lymphadenopathy.    RESP  Clear  P & A; w/o, wheezes/ rales/ or rhonchi. no accessory muscle use, no dullness to percussion  CARD:  RRR, no m/r/g, no peripheral edema,  pulses intact, no cyanosis or clubbing.  GI:   Soft & nt; nml bowel sounds; no organomegaly or masses detected.   Musco: Warm bil, no deformities or joint swelling noted.   Neuro: alert, no focal deficits noted.    Skin: Warm, no lesions or rashes    Lab Results:   BMET     Imaging: No results found.    No flowsheet data found.  No results found for: NITRICOXIDE      Assessment & Plan:   Obstructive sleep apnea Long discussion with patient regarding sleep apnea. Patient has tried CPAP and oral appliance in the past she has had multiple sleep studies. She is not a candidate for inspire device due to BMI. We discussed healthy sleep regimen.  Weight loss.  If symptoms persist can consider repeat sleep study and rechallenge with CPAP.  Plan  Patient Instructions  Healthy sleep regimen .  Continue on Requip At bedtime   Activity as tolerated.  Healthy weight loss.  Follow up with Dr. Elsworth Soho  in 1 year and As needed        Restless legs syndrome (RLS) Well-controlled on Requip continue on current regimen  Plan  Patient Instructions  Healthy sleep regimen .  Continue on Requip At bedtime   Activity as tolerated.  Healthy weight loss.  Follow up with Dr. Elsworth Soho  in 1 year and As needed        I spent  30  minutes dedicated to the care of this patient on the date of this encounter to include pre-visit review of records, face-to-face time with the patient discussing conditions above, post visit ordering of testing, clinical documentation with the electronic health record, making appropriate referrals as documented, and communicating necessary findings to members of the patients care team.    Rexene Edison, NP 02/26/2021

## 2021-02-26 NOTE — Assessment & Plan Note (Signed)
Well-controlled on Requip continue on current regimen  Plan  Patient Instructions  Healthy sleep regimen .  Continue on Requip At bedtime   Activity as tolerated.  Healthy weight loss.  Follow up with Dr. Elsworth Soho  in 1 year and As needed

## 2021-02-26 NOTE — Patient Instructions (Signed)
Healthy sleep regimen .  Continue on Requip At bedtime   Activity as tolerated.  Healthy weight loss.  Follow up with Dr. Elsworth Soho  in 1 year and As needed

## 2021-02-26 NOTE — Assessment & Plan Note (Signed)
Long discussion with patient regarding sleep apnea. Patient has tried CPAP and oral appliance in the past she has had multiple sleep studies. She is not a candidate for inspire device due to BMI. We discussed healthy sleep regimen.  Weight loss.  If symptoms persist can consider repeat sleep study and rechallenge with CPAP.  Plan  Patient Instructions  Healthy sleep regimen .  Continue on Requip At bedtime   Activity as tolerated.  Healthy weight loss.  Follow up with Dr. Elsworth Soho  in 1 year and As needed

## 2021-03-03 ENCOUNTER — Other Ambulatory Visit: Payer: Self-pay

## 2021-03-03 ENCOUNTER — Ambulatory Visit (INDEPENDENT_AMBULATORY_CARE_PROVIDER_SITE_OTHER): Payer: Medicare Other | Admitting: Neurology

## 2021-03-03 DIAGNOSIS — R2681 Unsteadiness on feet: Secondary | ICD-10-CM | POA: Diagnosis not present

## 2021-03-03 DIAGNOSIS — M79604 Pain in right leg: Secondary | ICD-10-CM

## 2021-03-03 NOTE — Procedures (Signed)
[facility] Neurology  [address]  [place], [place] [address] Tel: [phone] Fax:  [phone] Test Date:  03/03/2021  Patient: [name] DOB: [birth year] Physician: Narda Amber, DO  Sex: Female Height: 5\' 4"  Ref Phys: Metta Clines, D.O.  ID#: [id number]   Technician:    Patient Complaints: This is a 75 year old female referred for evaluation of bilateral leg pain and gait unsteadiness.  NCV & EMG Findings: Electrodiagnostic testing of the right lower extremity and additional studies of the left shows: Bilateral sural and superficial peroneal sensory responses are within normal limits. Bilateral peroneal and tibial motor responses are within normal limits. Bilateral tibial H reflex studies are within normal limits. There is no evidence of active or chronic motor axonal changes affecting any of the tested muscles.  Motor unit configuration and recruitment pattern is within normal limits.  Impression: This is a normal study of the lower extremities.  In particular, there is no evidence of a sensorimotor polyneuropathy or lumbosacral radiculopathy.      ___________________________ Narda Amber, DO    Nerve Conduction Studies Anti Sensory Summary Table   Stim Site NR Peak (ms) Norm Peak (ms) P-T Amp (V) Norm P-T Amp  Left Sup Peroneal Anti Sensory (Ant Lat Mall)  34C  12 cm    2.6 <4.6 7.2 >3  Right Sup Peroneal Anti Sensory (Ant Lat Mall)  34C  12 cm    2.3 <4.6 10.0 >3  Left Sural Anti Sensory (Lat Mall)  34C  Calf    3.3 <4.6 15.8 >3  Right Sural Anti Sensory (Lat Mall)  34C  Calf    3.2 <4.6 13.0 >3   Motor Summary Table   Stim Site NR Onset (ms) Norm Onset (ms) O-P Amp (mV) Norm O-P Amp Site1 Site2 Delta-0 (ms) Dist (cm) Vel (m/s) Norm Vel (m/s)  Left Peroneal Motor (Ext Dig Brev)  34C  Ankle    4.0 <6.0 4.3 >2.5 B Fib Ankle 7.9 40.0 51 >40  B Fib    11.9  3.8  Poplt B Fib 1.5 8.0 53 >40  Poplt    13.4  3.8         Right Peroneal  Motor (Ext Dig Brev)  34C  Ankle    3.3 <6.0 5.1 >2.5 B Fib Ankle 7.3 39.0 53 >40  B Fib    10.6  4.5  Poplt B Fib 1.7 8.0 47 >40  Poplt    12.3  4.5         Left Tibial Motor (Abd Hall Brev)  34C  Ankle    2.8 <6.0 11.6 >4 Knee Ankle 9.6 42.0 44 >40  Knee    12.4  6.1         Right Tibial Motor (Abd Hall Brev)  34C  Ankle    4.1 <6.0 9.9 >4 Knee Ankle 8.9 43.0 48 >40  Knee    13.0  5.5          H Reflex Studies   NR H-Lat (ms) Lat Norm (ms) L-R H-Lat (ms)  Left Tibial (Gastroc)  34C     34.83 <35 0.41  Right Tibial (Gastroc)  34C     34.42 <35 0.41   EMG   Side Muscle Ins Act Fibs Psw Fasc Number Recrt Dur Dur. Amp Amp. Poly Poly. Comment  Right AntTibialis Nml Nml Nml Nml Nml Nml Nml Nml Nml Nml Nml Nml N/A  Right Flex Dig Long Nml Nml Nml Nml Nml  Nml Nml Nml Nml Nml Nml Nml N/A  Right Gastroc Nml Nml Nml Nml Nml Nml Nml Nml Nml Nml Nml Nml N/A  Right RectFemoris Nml Nml Nml Nml Nml Nml Nml Nml Nml Nml Nml Nml N/A  Left BicepsFemS Nml Nml Nml Nml Nml Nml Nml Nml Nml Nml Nml Nml N/A  Right BicepsFemS Nml Nml Nml Nml Nml Nml Nml Nml Nml Nml Nml Nml N/A  Left AntTibialis Nml Nml Nml Nml Nml Nml Nml Nml Nml Nml Nml Nml N/A  Left Gastroc Nml Nml Nml Nml Nml Nml Nml Nml Nml Nml Nml Nml N/A  Left Flex Dig Long Nml Nml Nml Nml Nml Nml Nml Nml Nml Nml Nml Nml N/A  Left RectFemoris Nml Nml Nml Nml Nml Nml Nml Nml Nml Nml Nml Nml N/A      Waveforms:

## 2021-03-10 DIAGNOSIS — H16223 Keratoconjunctivitis sicca, not specified as Sjogren's, bilateral: Secondary | ICD-10-CM | POA: Diagnosis not present

## 2021-03-10 DIAGNOSIS — H43812 Vitreous degeneration, left eye: Secondary | ICD-10-CM | POA: Diagnosis not present

## 2021-03-18 DIAGNOSIS — H8309 Labyrinthitis, unspecified ear: Secondary | ICD-10-CM | POA: Diagnosis not present

## 2021-03-18 DIAGNOSIS — J0141 Acute recurrent pansinusitis: Secondary | ICD-10-CM | POA: Diagnosis not present

## 2021-04-02 ENCOUNTER — Other Ambulatory Visit: Payer: Self-pay | Admitting: Pulmonary Disease

## 2021-04-02 ENCOUNTER — Telehealth: Payer: Self-pay | Admitting: Oncology

## 2021-04-02 DIAGNOSIS — G2581 Restless legs syndrome: Secondary | ICD-10-CM

## 2021-04-02 NOTE — Telephone Encounter (Signed)
Scheduled appointment per 1/11 scheduling message. Left message. Patient will be mailed updated calendar.

## 2021-04-21 DIAGNOSIS — M2550 Pain in unspecified joint: Secondary | ICD-10-CM | POA: Diagnosis not present

## 2021-04-21 DIAGNOSIS — N1831 Chronic kidney disease, stage 3a: Secondary | ICD-10-CM | POA: Diagnosis not present

## 2021-04-30 ENCOUNTER — Inpatient Hospital Stay: Payer: Medicare Other | Attending: Oncology | Admitting: Oncology

## 2021-04-30 ENCOUNTER — Other Ambulatory Visit: Payer: Self-pay

## 2021-04-30 VITALS — BP 136/75 | HR 69 | Temp 97.9°F | Resp 17 | Ht 64.0 in | Wt 241.5 lb

## 2021-04-30 DIAGNOSIS — Z905 Acquired absence of kidney: Secondary | ICD-10-CM | POA: Diagnosis not present

## 2021-04-30 DIAGNOSIS — Z79899 Other long term (current) drug therapy: Secondary | ICD-10-CM | POA: Insufficient documentation

## 2021-04-30 DIAGNOSIS — K429 Umbilical hernia without obstruction or gangrene: Secondary | ICD-10-CM | POA: Diagnosis not present

## 2021-04-30 DIAGNOSIS — Z85528 Personal history of other malignant neoplasm of kidney: Secondary | ICD-10-CM | POA: Insufficient documentation

## 2021-04-30 DIAGNOSIS — N2889 Other specified disorders of kidney and ureter: Secondary | ICD-10-CM

## 2021-04-30 DIAGNOSIS — C641 Malignant neoplasm of right kidney, except renal pelvis: Secondary | ICD-10-CM

## 2021-04-30 NOTE — Progress Notes (Signed)
Hematology and Oncology Follow Up Visit  [name] [id number] [birth year] 76 y.o. 04/30/2021 8:28 AM  CC: [name] ([name]) Harrington Challenger, M.D.    Principle Diagnosis: 53 year old woman with kidney cancer diagnosed in 2011.  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   Prior therapy: She is status post right radical nephrectomy completed by Dr. Claudia Desanctis on Jul 31, 2019.  Final pathology showed a 6 cm clear-cell renal cell carcinoma.  Current therapy: Active surveillance.  Interim History:  [name] is here for return evaluation.  Since the last visit, she reports no major changes in her health.  She has reported more abdominal discomfort related to incisional hernia which has slightly worsened as of late.  She denied any nausea, vomiting or diarrhea.  She denies any hospitalizations or illnesses.  Her mobility has improved and no longer taking Cymbalta.  Her performance status and activity level remains unchanged.     Medications: Reviewed without changes. Current Outpatient Medications  Medication Sig Dispense Refill   acetaminophen (TYLENOL) 500 MG tablet Take 500-1,000 mg by mouth every 6 (six) hours as needed for mild pain (or headaches).     albuterol (VENTOLIN HFA) 108 (90 Base) MCG/ACT inhaler Inhale 2 puffs into the lungs every 6 (six) hours as needed for wheezing or shortness of breath. (Patient not taking: Reported on 02/26/2021)     b complex vitamins tablet Take 1 tablet by mouth daily.     Cholecalciferol (VITAMIN D3) 125 MCG (5000 UT) CAPS Take 5,000 Units by mouth in the morning.     diphenhydrAMINE (BENADRYL) 25 mg capsule Take 25 mg by mouth every 6 (six) hours as needed for allergies.     ipratropium (ATROVENT) 0.06 % nasal spray Place 2 sprays into both nostrils in the morning.     levothyroxine (SYNTHROID) 100 MCG tablet Take 100 mcg by mouth daily before breakfast.     LORazepam (ATIVAN) 0.5 MG tablet Take 1 tablet (0.5  mg total) by mouth daily as needed for anxiety. 30 tablet 0   losartan-hydrochlorothiazide (HYZAAR) 50-12.5 MG tablet Take 1 tablet by mouth 2 (two) times daily. 180 tablet 3   metoprolol tartrate (LOPRESSOR) 25 MG tablet Take 25 mg by mouth 2 (two) times daily.     MINERAL ICE 2 % GEL Apply 1 application topically at bedtime as needed (to painful sites).     Omega 3 1000 MG CAPS Take 1,000 mg by mouth daily.     ondansetron (ZOFRAN-ODT) 8 MG disintegrating tablet Take 8 mg by mouth every 8 (eight) hours as needed for nausea or vomiting (dissolve orally).     polyethylene glycol powder (GLYCOLAX/MIRALAX) 17 GM/SCOOP powder Take 17 g by mouth See admin instructions. Mix 17 grams of powder into a fruit smoothie and drink every morning     rOPINIRole (REQUIP) 0.5 MG tablet TAKE 1 TABLET BY MOUTH EVERY DAY AFTER SUPPER AND TAKE 2 TABLETS AT BEDTIME 90 tablet 10   rosuvastatin (CRESTOR) 5 MG tablet Take 1 tablet (5 mg total) by mouth daily. 90 tablet 3   SALONPAS 3.03-27-08 % PTCH Apply 1 patch topically See admin instructions. Apply 1 patch to affected sites one to two times a day as needed for pain     traMADol (ULTRAM) 50 MG tablet SMARTSIG:0.5-1 Tablet(s) By Mouth 2-3 Times Daily PRN     vitamin C (ASCORBIC ACID) 500 MG tablet Take 500 mg by mouth daily.  No current facility-administered medications for this visit.    Allergies:  Allergies  Allergen Reactions   Geodon [Ziprasidone Hydrochloride] Other (See Comments)    Extremely agitated   Lithium Nausea Only and Other (See Comments)    Off balance, increased heart rate   Talwin [Pentazocine] Other (See Comments)    Hallucinations    Toradol [Ketorolac Tromethamine] Other (See Comments)    Chest pains   Ziprasidone Other (See Comments)    Extreme agitation   Abilify [Aripiprazole] Other (See Comments)    jerking   Compazine [Prochlorperazine Edisylate] Nausea And Vomiting   Latuda [Lurasidone Hcl] Other (See Comments)    Reports made  her mind race more and made her irritable    Pristiq [Desvenlafaxine Succinate Er] Other (See Comments)    Did not work, prefers not to take   Rexulti [Brexpiprazole] Other (See Comments) and Hypertension    Elevated BP, created aggression   Diflucan [Fluconazole] Nausea And Vomiting     Physical Exam:  Blood pressure 136/75, pulse 69, temperature 97.9 F (36.6 C), temperature source Temporal, resp. rate 17, height 5\' 4"  (1.626 m), weight 241 lb 8 oz (109.5 kg), SpO2 99 %.      ECOG: 1    General appearance: Comfortable appearing without any discomfort Head: Normocephalic without any trauma Oropharynx: Mucous membranes are moist and pink without any thrush or ulcers. Eyes: Pupils are equal and round reactive to light. Lymph nodes: No cervical, supraclavicular, inguinal or axillary lymphadenopathy.   Heart:regular rate and rhythm.  S1 and S2 without leg edema. Lung: Clear without any rhonchi or wheezes.  No dullness to percussion. Abdomin: Soft, nontender, nondistended with good bowel sounds.  No hepatosplenomegaly. Musculoskeletal: No joint deformity or effusion.  Full range of motion noted. Neurological: No deficits noted on motor, sensory and deep tendon reflex exam. Skin: No petechial rash or dryness.  Appeared moist.       Lab Results: Lab Results  Component Value Date   WBC 4.9 09/22/2020   HGB 13.0 09/22/2020   HCT 39.4 09/22/2020   MCV 85.5 09/22/2020   PLT 223 09/22/2020     Chemistry      Component Value Date/Time   NA 139 09/22/2020 1241   NA 141 08/04/2020 1059   NA 139 02/18/2015 1440   K 3.9 09/22/2020 1241   K 4.2 02/18/2015 1440   CL 102 09/22/2020 1241   CL 103 07/21/2012 1415   CO2 28 09/22/2020 1241   CO2 29 02/18/2015 1440   BUN 19 09/22/2020 1241   BUN 39 (H) 08/04/2020 1059   BUN 19.1 02/18/2015 1440   CREATININE 1.09 (H) 09/22/2020 1241   CREATININE 0.9 02/18/2015 1440      Component Value Date/Time   CALCIUM 9.6 09/22/2020 1241    CALCIUM 10.2 02/18/2015 1440   ALKPHOS 63 09/04/2020 1743   ALKPHOS 89 02/18/2015 1440   AST 17 09/04/2020 1743   AST 20 02/18/2015 1440   ALT 15 09/04/2020 1743   ALT 16 02/18/2015 1440   BILITOT 0.7 09/04/2020 1743   BILITOT 0.37 02/18/2015 1440       Study Result  Narrative & Impression  CLINICAL DATA:  Umbilical mass, abdominal swelling, remote right nephrectomy   EXAM: CT ABDOMEN AND PELVIS WITHOUT CONTRAST   TECHNIQUE: Multidetector CT imaging of the abdomen and pelvis was performed following the standard protocol without IV contrast.   COMPARISON:  05/07/2020   FINDINGS: Lower chest: No acute abnormality.   Hepatobiliary:  Limited without IV contrast. Stable left hepatic dome subcapsular hypodensity measuring 11 mm favored to be a small cyst. No biliary obstruction pattern or dilatation. Peripherally calcified gallstones noted. Gallbladder nondistended. No surrounding inflammatory change. Common bile duct nondilated.   Pancreas: Unremarkable. No pancreatic ductal dilatation or surrounding inflammatory changes.   Spleen: Normal in size without focal abnormality.   Adrenals/Urinary Tract: No adrenal abnormality. Remote right nephrectomy with scarring in the right nephrectomy bed. Stable left renal cysts. No renal obstruction or hydronephrosis. No hydroureter or ureteral calculus. Bladder collapsed.   Stomach/Bowel: Similar small duodenal diverticulum with an air-fluid level. Negative for bowel obstruction, significant dilatation, ileus, or free air. Appendix not visualized. No acute inflammatory process in the right lower quadrant. Periumbilical midline ventral hernia is slightly larger with protruding small bowel. No associated complicating feature or incarceration. Hernia defect 5.4 cm, image 51/2.   Scattered colonic diverticulosis.  No acute inflammatory change.   No free fluid, fluid collection, hemorrhage, hematoma, abscess or ascites.    Vascular/Lymphatic: Limited without IV contrast. Negative for aneurysm. No retroperitoneal hemorrhage or hematoma. No bulky adenopathy.   Reproductive: Calcified small uterine fibroids. Stable uterine size. No adnexal abnormality or enlargement. No free fluid.   Other: No inguinal abnormality.  No ascites.   Musculoskeletal: Lower lumbar facet arthropathy. No acute osseous finding.   IMPRESSION: Slightly larger periumbilical hernia now with protruding small bowel loops but no evidence of complicating feature or incarceration.   Cholelithiasis   Remote right nephrectomy   Diverticulosis without acute inflammatory process   Aortic Atherosclerosis (ICD10-I70.0).       1.  Kidney cancer diagnosed in 2011.  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  She is currently on active surveillance without any evidence of relapsed disease.  CT scan obtained in November 2022 was personally reviewed and showed no evidence of metastatic disease at this time.  The risk of relapse and treatment choices were reviewed and at this time I recommended continued active surveillance with repeat imaging studies annually.  2.  Balance disorder: Improved at this time without any recent falls or syncope.  3.  Abdominal hernia: She continues to follow with general surgery regarding this issue with consideration for surgery in the future.   4.  Follow-up: In 6 months for repeat follow-up.   30  minutes were dedicated to this encounter.  The time was spent on reviewing laboratory data, disease status update and future plan of care discussion.    Zola Button 2/9/20238:28 AM

## 2021-05-06 DIAGNOSIS — R5383 Other fatigue: Secondary | ICD-10-CM | POA: Diagnosis not present

## 2021-05-06 DIAGNOSIS — B349 Viral infection, unspecified: Secondary | ICD-10-CM | POA: Diagnosis not present

## 2021-05-06 DIAGNOSIS — Z03818 Encounter for observation for suspected exposure to other biological agents ruled out: Secondary | ICD-10-CM | POA: Diagnosis not present

## 2021-05-06 DIAGNOSIS — R110 Nausea: Secondary | ICD-10-CM | POA: Diagnosis not present

## 2021-05-11 DIAGNOSIS — R531 Weakness: Secondary | ICD-10-CM | POA: Diagnosis not present

## 2021-05-11 DIAGNOSIS — M792 Neuralgia and neuritis, unspecified: Secondary | ICD-10-CM | POA: Diagnosis not present

## 2021-05-11 DIAGNOSIS — R110 Nausea: Secondary | ICD-10-CM | POA: Diagnosis not present

## 2021-05-14 DIAGNOSIS — M75121 Complete rotator cuff tear or rupture of right shoulder, not specified as traumatic: Secondary | ICD-10-CM | POA: Diagnosis not present

## 2021-05-14 DIAGNOSIS — M25511 Pain in right shoulder: Secondary | ICD-10-CM | POA: Diagnosis not present

## 2021-05-14 DIAGNOSIS — M25512 Pain in left shoulder: Secondary | ICD-10-CM | POA: Diagnosis not present

## 2021-05-15 DIAGNOSIS — K432 Incisional hernia without obstruction or gangrene: Secondary | ICD-10-CM | POA: Diagnosis not present

## 2021-06-02 DIAGNOSIS — M797 Fibromyalgia: Secondary | ICD-10-CM | POA: Diagnosis not present

## 2021-06-17 DIAGNOSIS — R944 Abnormal results of kidney function studies: Secondary | ICD-10-CM | POA: Diagnosis not present

## 2021-06-17 DIAGNOSIS — M25561 Pain in right knee: Secondary | ICD-10-CM | POA: Diagnosis not present

## 2021-06-17 DIAGNOSIS — M25569 Pain in unspecified knee: Secondary | ICD-10-CM | POA: Diagnosis not present

## 2021-06-17 DIAGNOSIS — M1710 Unilateral primary osteoarthritis, unspecified knee: Secondary | ICD-10-CM | POA: Diagnosis not present

## 2021-07-02 DIAGNOSIS — M1711 Unilateral primary osteoarthritis, right knee: Secondary | ICD-10-CM | POA: Diagnosis not present

## 2021-07-02 DIAGNOSIS — M25511 Pain in right shoulder: Secondary | ICD-10-CM | POA: Diagnosis not present

## 2021-07-06 DIAGNOSIS — R160 Hepatomegaly, not elsewhere classified: Secondary | ICD-10-CM | POA: Diagnosis not present

## 2021-07-06 DIAGNOSIS — M797 Fibromyalgia: Secondary | ICD-10-CM | POA: Diagnosis not present

## 2021-07-06 DIAGNOSIS — N281 Cyst of kidney, acquired: Secondary | ICD-10-CM | POA: Diagnosis not present

## 2021-07-06 DIAGNOSIS — G43809 Other migraine, not intractable, without status migrainosus: Secondary | ICD-10-CM | POA: Diagnosis not present

## 2021-07-06 DIAGNOSIS — K8020 Calculus of gallbladder without cholecystitis without obstruction: Secondary | ICD-10-CM | POA: Diagnosis not present

## 2021-07-06 DIAGNOSIS — Z79899 Other long term (current) drug therapy: Secondary | ICD-10-CM | POA: Diagnosis not present

## 2021-07-06 DIAGNOSIS — R0989 Other specified symptoms and signs involving the circulatory and respiratory systems: Secondary | ICD-10-CM | POA: Diagnosis not present

## 2021-07-06 DIAGNOSIS — I517 Cardiomegaly: Secondary | ICD-10-CM | POA: Diagnosis not present

## 2021-07-06 DIAGNOSIS — Z20822 Contact with and (suspected) exposure to covid-19: Secondary | ICD-10-CM | POA: Diagnosis not present

## 2021-07-06 DIAGNOSIS — R9082 White matter disease, unspecified: Secondary | ICD-10-CM | POA: Diagnosis not present

## 2021-07-06 DIAGNOSIS — R519 Headache, unspecified: Secondary | ICD-10-CM | POA: Diagnosis not present

## 2021-07-06 DIAGNOSIS — R531 Weakness: Secondary | ICD-10-CM | POA: Diagnosis not present

## 2021-07-06 DIAGNOSIS — K760 Fatty (change of) liver, not elsewhere classified: Secondary | ICD-10-CM | POA: Diagnosis not present

## 2021-07-08 DIAGNOSIS — E039 Hypothyroidism, unspecified: Secondary | ICD-10-CM | POA: Diagnosis not present

## 2021-07-08 DIAGNOSIS — E119 Type 2 diabetes mellitus without complications: Secondary | ICD-10-CM | POA: Diagnosis not present

## 2021-07-08 DIAGNOSIS — I2510 Atherosclerotic heart disease of native coronary artery without angina pectoris: Secondary | ICD-10-CM | POA: Diagnosis not present

## 2021-07-08 DIAGNOSIS — N179 Acute kidney failure, unspecified: Secondary | ICD-10-CM | POA: Diagnosis not present

## 2021-07-08 DIAGNOSIS — N1831 Chronic kidney disease, stage 3a: Secondary | ICD-10-CM | POA: Diagnosis not present

## 2021-07-08 DIAGNOSIS — I10 Essential (primary) hypertension: Secondary | ICD-10-CM | POA: Diagnosis not present

## 2021-07-08 DIAGNOSIS — G4733 Obstructive sleep apnea (adult) (pediatric): Secondary | ICD-10-CM | POA: Diagnosis not present

## 2021-07-10 DIAGNOSIS — N179 Acute kidney failure, unspecified: Secondary | ICD-10-CM | POA: Diagnosis not present

## 2021-07-10 DIAGNOSIS — I2510 Atherosclerotic heart disease of native coronary artery without angina pectoris: Secondary | ICD-10-CM | POA: Diagnosis not present

## 2021-07-10 DIAGNOSIS — I10 Essential (primary) hypertension: Secondary | ICD-10-CM | POA: Diagnosis not present

## 2021-07-10 DIAGNOSIS — E039 Hypothyroidism, unspecified: Secondary | ICD-10-CM | POA: Diagnosis not present

## 2021-07-12 DIAGNOSIS — E039 Hypothyroidism, unspecified: Secondary | ICD-10-CM | POA: Diagnosis not present

## 2021-07-12 DIAGNOSIS — N179 Acute kidney failure, unspecified: Secondary | ICD-10-CM | POA: Diagnosis not present

## 2021-07-12 DIAGNOSIS — I2510 Atherosclerotic heart disease of native coronary artery without angina pectoris: Secondary | ICD-10-CM | POA: Diagnosis not present

## 2021-07-12 DIAGNOSIS — E785 Hyperlipidemia, unspecified: Secondary | ICD-10-CM | POA: Diagnosis not present

## 2021-07-12 DIAGNOSIS — G4733 Obstructive sleep apnea (adult) (pediatric): Secondary | ICD-10-CM | POA: Diagnosis not present

## 2021-07-12 DIAGNOSIS — N1831 Chronic kidney disease, stage 3a: Secondary | ICD-10-CM | POA: Diagnosis not present

## 2021-07-12 DIAGNOSIS — I10 Essential (primary) hypertension: Secondary | ICD-10-CM | POA: Diagnosis not present

## 2021-07-12 DIAGNOSIS — E119 Type 2 diabetes mellitus without complications: Secondary | ICD-10-CM | POA: Diagnosis not present

## 2021-07-21 DIAGNOSIS — Z0000 Encounter for general adult medical examination without abnormal findings: Secondary | ICD-10-CM | POA: Diagnosis not present

## 2021-07-21 DIAGNOSIS — R7303 Prediabetes: Secondary | ICD-10-CM | POA: Diagnosis not present

## 2021-07-21 DIAGNOSIS — N1831 Chronic kidney disease, stage 3a: Secondary | ICD-10-CM | POA: Diagnosis not present

## 2021-07-21 DIAGNOSIS — Z1211 Encounter for screening for malignant neoplasm of colon: Secondary | ICD-10-CM | POA: Diagnosis not present

## 2021-07-21 DIAGNOSIS — Z800 Family history of malignant neoplasm of digestive organs: Secondary | ICD-10-CM | POA: Diagnosis not present

## 2021-07-21 DIAGNOSIS — E039 Hypothyroidism, unspecified: Secondary | ICD-10-CM | POA: Diagnosis not present

## 2021-07-21 DIAGNOSIS — I10 Essential (primary) hypertension: Secondary | ICD-10-CM | POA: Diagnosis not present

## 2021-07-21 DIAGNOSIS — E7800 Pure hypercholesterolemia, unspecified: Secondary | ICD-10-CM | POA: Diagnosis not present

## 2021-07-28 ENCOUNTER — Other Ambulatory Visit: Payer: Self-pay

## 2021-07-28 ENCOUNTER — Emergency Department (HOSPITAL_COMMUNITY)
Admission: EM | Admit: 2021-07-28 | Discharge: 2021-07-28 | Disposition: A | Discharge status: Home / Self Care | Payer: Medicare Other | Attending: Emergency Medicine | Admitting: Emergency Medicine

## 2021-07-28 ENCOUNTER — Encounter (HOSPITAL_COMMUNITY): Payer: Self-pay | Admitting: Emergency Medicine

## 2021-07-28 DIAGNOSIS — Z79899 Other long term (current) drug therapy: Secondary | ICD-10-CM | POA: Diagnosis not present

## 2021-07-28 DIAGNOSIS — Z7982 Long term (current) use of aspirin: Secondary | ICD-10-CM | POA: Insufficient documentation

## 2021-07-28 DIAGNOSIS — R110 Nausea: Secondary | ICD-10-CM | POA: Diagnosis not present

## 2021-07-28 DIAGNOSIS — R404 Transient alteration of awareness: Secondary | ICD-10-CM | POA: Diagnosis not present

## 2021-07-28 DIAGNOSIS — R531 Weakness: Secondary | ICD-10-CM | POA: Diagnosis not present

## 2021-07-28 DIAGNOSIS — R42 Dizziness and giddiness: Secondary | ICD-10-CM | POA: Diagnosis not present

## 2021-07-28 DIAGNOSIS — R55 Syncope and collapse: Secondary | ICD-10-CM | POA: Diagnosis not present

## 2021-07-28 DIAGNOSIS — I499 Cardiac arrhythmia, unspecified: Secondary | ICD-10-CM | POA: Diagnosis not present

## 2021-07-28 DIAGNOSIS — Z743 Need for continuous supervision: Secondary | ICD-10-CM | POA: Diagnosis not present

## 2021-07-28 LAB — BASIC METABOLIC PANEL
Anion gap: 8 (ref 5–15)
BUN: 28 mg/dL — ABNORMAL HIGH (ref 8–23)
CO2: 28 mmol/L (ref 22–32)
Calcium: 9.3 mg/dL (ref 8.9–10.3)
Chloride: 104 mmol/L (ref 98–111)
Creatinine, Ser: 1.49 mg/dL — ABNORMAL HIGH (ref 0.44–1.00)
GFR, Estimated: 36 mL/min — ABNORMAL LOW (ref >60)
Glucose, Bld: 86 mg/dL (ref 70–99)
Potassium: 3.7 mmol/L (ref 3.5–5.1)
Sodium: 140 mmol/L (ref 135–145)

## 2021-07-28 LAB — URINALYSIS, ROUTINE W REFLEX MICROSCOPIC
Bilirubin Urine: NEGATIVE
Glucose, UA: NEGATIVE mg/dL
Hgb urine dipstick: NEGATIVE
Ketones, ur: 5 mg/dL — AB
Nitrite: NEGATIVE
Protein, ur: NEGATIVE mg/dL
Specific Gravity, Urine: 1.026 (ref 1.005–1.030)
pH: 5 (ref 5.0–8.0)

## 2021-07-28 LAB — CBC
HCT: 40 % (ref 36.0–46.0)
Hemoglobin: 12.9 g/dL (ref 12.0–15.0)
MCH: 27.8 pg (ref 26.0–34.0)
MCHC: 32.3 g/dL (ref 30.0–36.0)
MCV: 86.2 fL (ref 80.0–100.0)
Platelets: 249 10*3/uL (ref 150–400)
RBC: 4.64 MIL/uL (ref 3.87–5.11)
RDW: 14.4 % (ref 11.5–15.5)
WBC: 6.1 10*3/uL (ref 4.0–10.5)
nRBC: 0 % (ref 0.0–0.2)

## 2021-07-28 LAB — CBG MONITORING, ED: Glucose-Capillary: 127 mg/dL — ABNORMAL HIGH (ref 70–99)

## 2021-07-28 LAB — TSH: TSH: 2.2 u[IU]/mL (ref 0.350–4.500)

## 2021-07-28 LAB — TROPONIN I (HIGH SENSITIVITY): Troponin I (High Sensitivity): 2 ng/L (ref <18)

## 2021-07-28 MED ORDER — SODIUM CHLORIDE 0.9 % IV BOLUS
1000.0000 mL | Freq: Once | INTRAVENOUS | Status: AC
  Administered 2021-07-28: 1000 mL via INTRAVENOUS

## 2021-07-28 MED ORDER — ROPINIROLE HCL 1 MG PO TABS
0.5000 mg | ORAL_TABLET | Freq: Once | ORAL | Status: AC
  Administered 2021-07-28: 0.5 mg via ORAL
  Filled 2021-07-28: qty 1

## 2021-07-28 NOTE — ED Triage Notes (Signed)
BIB [facility]  ?Per EMS: Pt coming from DR office w/ c/o weakness around 2pm. BP 70s/50s when pt took at [place].  ?Nausea & dizziness x 2 weeks.  ?122/70  ?60HR ?132 CBG  ? ?

## 2021-07-28 NOTE — Discharge Instructions (Signed)
Eat and drink as well as you can for the next 48 hours.  Please follow-up with your family doctor and discussed with them your visit here.  They may want to recommend physical therapy or may examine your medication list and see if they can change one of your medicines. ?

## 2021-07-28 NOTE — ED Notes (Signed)
Discharge instructions reviewed, questions answered. Rx education provided - pt instructed to hold dose of metoprolol tonight and call cardiologist and/or PCP in the AM. Pt states understanding and no further questions. Pt ambulatory with steady gait upon discharge. Pt wheeled to lobby to wait for ride. No s/s of distress noted. ? ?

## 2021-07-28 NOTE — ED Provider Notes (Signed)
?[facility] DEPT ?Provider Note ? ? ?CSN: [id number] ?Arrival date & time: 07/28/21  1714 ? ?  ? ?History ? ?Chief Complaint  ?Patient presents with  ? Weakness  ? ? ?[name] is a 76 y.o. female. ? ?76 yo F with a chief complaints of not feeling well.  She tells me that she has not felt well for a while.  Felt like things have been worse over the past week or so.  She just [facility] out of a psychiatric hospital.  Was out at lunch with her friend today and went to [facility] and felt like she might pass out.  Checked her blood pressure couple times and noted it to be low and decided to come here for evaluation.  She tells me before her lunch today she has not really anything to eat for about a week and a half because she did not feel like eating.  When asked what she had been doing at home she told me mostly hibernating.  She does feel like she has some discomfort to the upper part of her abdomen or may be across her chest. ? ? ?Weakness ? ?  ? ?Home Medications ?Prior to Admission medications   ?Medication Sig Start Date End Date Taking? Authorizing Provider  ?acetaminophen (TYLENOL) 500 MG tablet Take 500-1,000 mg by mouth every 6 (six) hours as needed for mild pain (or headaches). ?Patient not taking: Reported on 04/30/2021    [provider]  ?aspirin 81 MG EC tablet 1 tablet 12/30/20   [provider]  ?b complex vitamins tablet Take 1 tablet by mouth daily.    [provider]  ?Cholecalciferol (VITAMIN D3) 125 MCG (5000 UT) CAPS Take 5,000 Units by mouth in the morning.    [provider]  ?diphenhydrAMINE (BENADRYL) 25 mg capsule Take 25 mg by mouth every 6 (six) hours as needed for allergies.    [provider]  ?FLUoxetine (PROZAC) 20 MG capsule Take 20 mg by mouth daily. 04/16/21   [provider]  ?gatifloxacin (ZYMAXID) 0.5 % SOLN Place 1 drop into the right eye 4 (four) times daily. 12/09/20   [provider]   ?ipratropium (ATROVENT) 0.06 % nasal spray Place 2 sprays into both nostrils in the morning.    [provider]  ?levothyroxine (SYNTHROID) 88 MCG tablet Take 88 mcg by mouth every morning. 03/14/21   [provider]  ?LORazepam (ATIVAN) 0.5 MG tablet Take 1 tablet (0.5 mg total) by mouth daily as needed for anxiety. 10/13/20   Derrill Center, NP  ?losartan-hydrochlorothiazide (HYZAAR) 50-12.5 MG tablet Take 1 tablet by mouth 2 (two) times daily. 08/04/20   Almyra Deforest, PA  ?methylphenidate (RITALIN) 5 MG tablet Take 5 mg by mouth 2 (two) times daily. 01/13/21   [provider]  ?metoprolol tartrate (LOPRESSOR) 25 MG tablet Take 25 mg by mouth 2 (two) times daily. 10/15/20   [provider]  ?MINERAL ICE 2 % GEL Apply 1 application topically at bedtime as needed (to painful sites).    [provider]  ?Omega 3 1000 MG CAPS Take 1,000 mg by mouth daily.    [provider]  ?ondansetron (ZOFRAN-ODT) 8 MG disintegrating tablet Take 8 mg by mouth every 8 (eight) hours as needed for nausea or vomiting (dissolve orally).    [provider]  ?polyethylene glycol powder (GLYCOLAX/MIRALAX) 17 GM/SCOOP powder Take 17 g by mouth See admin instructions. Mix 17 grams of powder into  a fruit smoothie and drink every morning ?Patient not taking: Reported on 04/30/2021    [provider]  ?prednisoLONE acetate (PRED FORTE) 1 % ophthalmic suspension SMARTSIG:In Eye(s) 03/10/21   [provider]  ?predniSONE (DELTASONE) 20 MG tablet Take by mouth. 04/21/21   [provider]  ?promethazine (PHENERGAN) 25 MG tablet SMARTSIG:1 Tablet(s) By Mouth Every 12 Hours PRN 02/05/21   [provider]  ?rOPINIRole (REQUIP) 0.5 MG tablet TAKE 1 TABLET BY MOUTH EVERY DAY AFTER SUPPER AND TAKE 2 TABLETS AT BEDTIME 04/02/21   Rigoberto Noel, MD  ?rosuvastatin (CRESTOR) 5 MG tablet Take 1 tablet (5 mg total) by mouth daily. 11/09/19 01/15/21  Buford Dresser, MD  ?Elwin Mocha 3.03-27-08 % PTCH Apply 1 patch topically See admin instructions. Apply 1 patch to affected sites one to two times a day as needed for pain    [provider]  ?traMADol (ULTRAM) 50 MG tablet SMARTSIG:0.5-1 Tablet(s) By Mouth 2-3 Times Daily PRN 02/19/21   [provider]  ?vitamin C (ASCORBIC ACID) 500 MG tablet Take 500 mg by mouth daily.    [provider]  ?   ? ?Allergies    ?Geodon [ziprasidone hydrochloride], Lithium, Talwin [pentazocine], Toradol [ketorolac tromethamine], Ziprasidone, Abilify [aripiprazole], Compazine [prochlorperazine edisylate], Latuda [lurasidone hcl], Pristiq [desvenlafaxine succinate er], Rexulti [brexpiprazole], and Diflucan [fluconazole]   ? ?Review of Systems   ?Review of Systems  ?Neurological:  Positive for weakness.  ? ?Physical Exam ?Updated Vital Signs ?BP (!) 107/55   Pulse (!) 50   Temp 97.9 ?F (36.6 ?C) (Oral)   Resp 18   Ht '5\' 3"'$  (1.6 m)   Wt 104.3 kg   SpO2 97%   BMI 40.74 kg/m?  ?Physical Exam ?Vitals and nursing note reviewed.  ?Constitutional:   ?   General: She is not in acute distress. ?   Appearance: She is well-developed. She is not diaphoretic.  ?   Comments: BMI 41.3  ?HENT:  ?   Head: Normocephalic and atraumatic.  ?Eyes:  ?   Pupils: Pupils are equal, round, and reactive to light.  ?Cardiovascular:  ?   Rate and Rhythm: Normal rate and regular rhythm.  ?   Heart sounds: No murmur heard. ?  No friction rub. No gallop.  ?Pulmonary:  ?   Effort: Pulmonary effort is normal.  ?   Breath sounds: No wheezing or rales.  ?Abdominal:  ?   General: There is no distension.  ?   Palpations: Abdomen is soft.  ?   Tenderness: There is no abdominal tenderness.  ?Musculoskeletal:     ?   General: No tenderness.  ?   Cervical back: Normal range of motion and neck supple.  ?Skin: ?   General: Skin is warm and dry.  ?Neurological:  ?   Mental Status: She is alert and oriented to person, place, and time.  ?Psychiatric:     ?    Behavior: Behavior normal.  ? ? ?ED Results / Procedures / Treatments   ?Labs ?(all labs ordered are listed, but only abnormal results are displayed) ?Labs Reviewed  ?BASIC METABOLIC PANEL - Abnormal; Notable for the following components:  ?    Result Value  ? BUN 28 (*)   ? Creatinine, Ser 1.49 (*)   ? GFR, Estimated 36 (*)   ? All other components within normal limits  ?URINALYSIS, ROUTINE W REFLEX MICROSCOPIC - Abnormal; Notable for the following components:  ? APPearance HAZY (*)   ?  Ketones, ur 5 (*)   ? Leukocytes,Ua SMALL (*)   ? Bacteria, UA RARE (*)   ? All other components within normal limits  ?CBG MONITORING, ED - Abnormal; Notable for the following components:  ? Glucose-Capillary 127 (*)   ? All other components within normal limits  ?CBC  ?TSH  ?CBG MONITORING, ED  ?TROPONIN I (HIGH SENSITIVITY)  ? ? ?EKG ?EKG Interpretation ? ?Date/Time:  Tuesday Jul 28 2021 19:27:14 EDT ?Ventricular Rate:  54 ?PR Interval:  142 ?QRS Duration: 99 ?QT Interval:  469 ?QTC Calculation: 445 ?R Axis:   68 ?Text Interpretation: Sinus rhythm Consider left atrial enlargement no wpw, prolonged qt or brugada No significant change since last tracing Confirmed by Deno Etienne [phone]) on 07/28/2021 7:41:41 PM ? ?Radiology ?No results found. ? ?Procedures ?Procedures  ? ? ?Medications Ordered in ED ?Medications  ?sodium chloride 0.9 % bolus 1,000 mL (0 mLs Intravenous Stopped 07/28/21 2100)  ?rOPINIRole (REQUIP) tablet 0.5 mg (0.5 mg Oral Given 07/28/21 2014)  ? ? ?ED Course/ Medical Decision Making/ A&P ?  ?                        ?Medical Decision Making ?Amount and/or Complexity of Data Reviewed ?Labs: ordered. ? ?Risk ?Prescription drug management. ? ? ?76 yo F with a chief complaint of not feeling well.  She has trouble quantifying this much further.  Tells me that she has been very fatigued and at 1 point today felt she might pass out.  She checked her blood pressure at [facility] blood pressure cuff and it was in the 70s.  Blood  pressure here is been unremarkable.  We will obtain a laboratory evaluation bolus of IV fluids reassess. ? ?10:28 PM:  I have discussed the diagnosis/risks/treatment options with the patient.  Evaluation and diagnostic testing i

## 2021-07-29 NOTE — Progress Notes (Deleted)
NEUROLOGY FOLLOW UP OFFICE NOTE  [name] [id number]  Assessment/Plan:   ***  Subjective:  [name] is a 76 year old female with CAD, HTN, hypothyroidism, anxiety, PTSD and CKD with history of kidney cancer who follows up for balance disorder.  UPDATE: Underwent workup for neuropathy.  Labs from October 2022 revealed B12 938.  NCV-EMG on 03/03/2021 was normal.  She has continued to struggle with depression for which she was recently hospitalized.  She presented to the ED at [facility] for generalized weakness, lightheadedness and just not feeling well.  Workup overall was unremarkable.     HISTORY: Patient has history of vertigo.  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  She has history of reported brain fog and short term memory problems since at least 2011.  MRI of brain with and without contrast on 02/12/2010 to assess confusion was personally reviewed and was unremarkable, as was a subsequent MRI of brain without contrast, personally reviewed, from 04/20/2013.  Neuropsychological evaluation on 08/22/2020 demonstrated some isolated impairments but overall unremarkable with findings likely related to her psychiatric comorbidities (ADHD, depression, anxiety) rather than underlying neurocognitive disorder.  She reports balance problems over the past year.  She feels weak in the legs.  However, she has history of chronic pain in the joints, such as the hips and knees.  She has arthritis in the right knee.  Over the past year, she reports numbness and tingling as well as pain in the feet and tingling and numbness in the hands.  She has acquired hypothyroidism which is treated and monitored.  TSH from June 2022 was 1.485.  She has low back pain and aching in the legs.  Denies neck pain.  Over the summer, it looked like that she may have had mild facial asymmetry, with slight droop on the right.  MRI of brain without contrast on 11/05/2020  personally reviewed showed no acute intracranial abnormality.  It has since resolved.  She reports one other episode in the past.  She is concerned about her balance and leg weakness because her sister, who has MS, has similar symptoms.  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 FFR of LAD  not performed because of difficulty with catheter control from the right radial.    Chronic cough 02/22/2020   Chronic kidney disease    hx of kidney cancer   Diastolic dysfunction, left ventricle 05/31/2013   Difficult intubation    told by [facility] that she was hard to intubate 15 yrs ago in [place]- surgery since then no problems   Dyspnea    Essential hypertension 05/31/2013   Generalized anxiety disorder    Adequate for discharge    GERD (gastroesophageal reflux disease)    History of attention deficit disorder    History of echocardiogram 07/2012   Normal LVF w grade I siastolic dysfunction    History of endometriosis    History of gall  stones 12/31/2009   History of pleural effusion    Hyperlipidemia 06/24/2016   LDL goal <70   Hypertension    Hypothyroidism 1990   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 Elsworth Soho discussed inspire device, patient would need to lose weight   PAT (paroxysmal atrial tachycardia)    s/p ablation   PONV (postoperative nausea and vomiting)    PTSD (post-traumatic stress disorder)    Pure hypercholesterolemia 02/18/2019   PVC's (premature ventricular contractions)    Renal mass 02/15/2012   Restless legs syndrome (RLS) 02/20/2007   Vertigo     MEDICATIONS: Current Outpatient Medications on File Prior to Visit  Medication Sig Dispense Refill   acetaminophen (TYLENOL) 500 MG tablet Take 500-1,000 mg by mouth every 6 (six) hours as needed for mild pain (or headaches). (Patient not taking: Reported on 04/30/2021)     aspirin 81 MG EC tablet 1 tablet     b complex vitamins tablet Take 1 tablet by mouth daily.     Cholecalciferol (VITAMIN D3) 125 MCG (5000 UT) CAPS Take 5,000 Units by mouth in the morning.     diphenhydrAMINE (BENADRYL) 25 mg capsule Take 25 mg by mouth every 6 (six) hours as needed for allergies.     FLUoxetine (PROZAC) 20 MG capsule Take 20 mg by mouth daily.     gatifloxacin (ZYMAXID) 0.5 % SOLN Place 1 drop into the right eye 4 (four) times daily.     ipratropium (ATROVENT) 0.06 % nasal spray Place 2 sprays into both nostrils in the morning.     levothyroxine (SYNTHROID) 88 MCG tablet Take 88 mcg by mouth every morning.     LORazepam (ATIVAN) 0.5 MG tablet Take 1 tablet (0.5 mg total) by mouth daily as needed for anxiety. 30 tablet 0   losartan-hydrochlorothiazide (HYZAAR) 50-12.5 MG tablet Take 1 tablet by mouth 2 (two) times daily. 180 tablet 3   methylphenidate (RITALIN) 5 MG tablet Take 5 mg by mouth 2 (two) times daily.     metoprolol tartrate (LOPRESSOR) 25 MG tablet Take 25 mg by mouth 2 (two) times daily.     MINERAL ICE 2 % GEL Apply 1 application topically at bedtime as needed (to painful sites).     Omega 3 1000 MG CAPS Take 1,000 mg by mouth daily.     ondansetron  (ZOFRAN-ODT) 8 MG disintegrating tablet Take 8 mg by mouth every 8 (eight) hours as needed for nausea or vomiting (dissolve orally).     polyethylene glycol powder (GLYCOLAX/MIRALAX) 17 GM/SCOOP powder Take 17 g by mouth See admin instructions. Mix 17 grams of powder into a fruit smoothie and drink every morning (Patient not taking: Reported on 04/30/2021)     prednisoLONE acetate (PRED FORTE) 1 % ophthalmic suspension SMARTSIG:In Eye(s)     predniSONE (DELTASONE) 20 MG tablet Take by mouth.     promethazine (PHENERGAN) 25 MG tablet SMARTSIG:1 Tablet(s) By Mouth Every 12 Hours PRN     rOPINIRole (REQUIP) 0.5 MG tablet TAKE 1 TABLET BY MOUTH EVERY DAY AFTER SUPPER AND TAKE 2 TABLETS AT BEDTIME 90 tablet 10  rosuvastatin (CRESTOR) 5 MG tablet Take 1 tablet (5 mg total) by mouth daily. 90 tablet 3   SALONPAS 3.03-27-08 % PTCH Apply 1 patch topically See admin instructions. Apply 1 patch to affected sites one to two times a day as needed for pain     traMADol (ULTRAM) 50 MG tablet SMARTSIG:0.5-1 Tablet(s) By Mouth 2-3 Times Daily PRN     vitamin C (ASCORBIC ACID) 500 MG tablet Take 500 mg by mouth daily.     No current facility-administered medications on file prior to visit.    ALLERGIES: Allergies  Allergen Reactions   Geodon [Ziprasidone Hydrochloride] Other (See Comments)    Extremely agitated   Lithium Nausea Only and Other (See Comments)    Off balance, increased heart rate   Talwin [Pentazocine] Other (See Comments)    Hallucinations    Toradol [Ketorolac Tromethamine] Other (See Comments)    Chest pains   Ziprasidone Other (See Comments)    Extreme agitation   Abilify [Aripiprazole] Other (See Comments)    jerking   Compazine [Prochlorperazine Edisylate] Nausea And Vomiting   Latuda [Lurasidone Hcl] Other (See Comments)    Reports made her mind race more and made her irritable    Pristiq [Desvenlafaxine Succinate Er] Other (See Comments)    Did not work, prefers not to take    Rexulti [Brexpiprazole] Other (See Comments) and Hypertension    Elevated BP, created aggression   Diflucan [Fluconazole] Nausea And Vomiting    FAMILY HISTORY: Family History  Problem Relation Age of Onset   Allergies Father    Skin cancer Father    Alcohol abuse Father    Mental illness Father    Heart disease Mother    Depression Mother    Hypertension Mother    CAD Mother    Colon cancer Paternal Grandmother    OCD Paternal Grandmother    Multiple sclerosis Sister    Autoimmune disease Brother        unknown type   Allergies Daughter    Heart disease Maternal Grandfather    Cancer Paternal Grandfather    Parkinson's disease Paternal Grandfather    Mental illness Maternal Grandmother       Objective:  *** General: No acute distress.  Patient appears ***-groomed.   Head:  Normocephalic/atraumatic Eyes:  Fundi examined but not visualized Neck: supple, no paraspinal tenderness, full range of motion Heart:  Regular rate and rhythm Lungs:  Clear to auscultation bilaterally Back: No paraspinal tenderness Neurological Exam: alert and oriented to person, place, and time.  Speech fluent and not dysarthric, language intact.  CN II-XII intact. Bulk and tone normal, muscle strength 5/5 throughout.  Sensation to light touch intact.  Deep tendon reflexes 2+ throughout, toes downgoing.  Finger to nose testing intact.  Gait normal, Romberg negative.   Metta Clines, DO  CC: ***

## 2021-07-30 ENCOUNTER — Encounter: Payer: Self-pay | Admitting: Neurology

## 2021-07-30 ENCOUNTER — Ambulatory Visit: Payer: Medicare Other | Admitting: Neurology

## 2021-07-30 DIAGNOSIS — R001 Bradycardia, unspecified: Secondary | ICD-10-CM | POA: Diagnosis not present

## 2021-07-30 DIAGNOSIS — R519 Headache, unspecified: Secondary | ICD-10-CM | POA: Diagnosis not present

## 2021-07-30 DIAGNOSIS — Z87898 Personal history of other specified conditions: Secondary | ICD-10-CM | POA: Diagnosis not present

## 2021-07-30 DIAGNOSIS — E559 Vitamin D deficiency, unspecified: Secondary | ICD-10-CM | POA: Diagnosis not present

## 2021-07-30 DIAGNOSIS — Z029 Encounter for administrative examinations, unspecified: Secondary | ICD-10-CM

## 2021-08-04 ENCOUNTER — Ambulatory Visit: Payer: Medicare Other | Admitting: Neurology

## 2021-08-06 DIAGNOSIS — M1711 Unilateral primary osteoarthritis, right knee: Secondary | ICD-10-CM | POA: Diagnosis not present

## 2021-08-06 DIAGNOSIS — M75121 Complete rotator cuff tear or rupture of right shoulder, not specified as traumatic: Secondary | ICD-10-CM | POA: Diagnosis not present

## 2021-08-10 ENCOUNTER — Telehealth: Payer: Self-pay | Admitting: *Deleted

## 2021-08-10 ENCOUNTER — Ambulatory Visit: Payer: Self-pay | Admitting: General Surgery

## 2021-08-10 ENCOUNTER — Encounter (HOSPITAL_BASED_OUTPATIENT_CLINIC_OR_DEPARTMENT_OTHER): Payer: Self-pay

## 2021-08-10 DIAGNOSIS — K432 Incisional hernia without obstruction or gangrene: Secondary | ICD-10-CM | POA: Diagnosis not present

## 2021-08-10 NOTE — Telephone Encounter (Signed)
   Pre-operative Risk Assessment    Patient Name: [name]  DOB: [birth year] MRN: [id number]      Request for Surgical Clearance    Procedure:   [place]  Date of Surgery:  Clearance TBD                                 Surgeon:  DR. Ralene Ok Surgeon's Group or Practice Name:  [facility] Phone number:  [phone] Fax number:  [phone] ATTN: MICHELLE BROOKS, CMA   Type of Clearance Requested:   - Medical  PT IS ON ASA 81 MG; THOUGH NO MEDICATIONS ARE BEING REQUESTED ON CLEARANCE TO BE HELD   Type of Anesthesia:  General    Additional requests/questions:    [name]   08/10/2021, 4:41 PM

## 2021-08-10 NOTE — H&P (Signed)
Chief Complaint: Hernia       History of Present Illness: [name] is a 76 y.o. female who is seen today as an office consultation at the request of Dr. Pasty Arch for evaluation of Hernia .   Patient is a 76 year old female, with a history of renal cell carcinoma status post right nephrectomy.  Patient also with major depressive disorder.  Patient states that she was recently hospitalized secondary to depression.   Patient comes back in today secondary to her ventral incisional hernia.  Previously she underwent CT scan which I discussed with her which revealed an 8 x 9 cm hernia.  This appears to be incarcerated with small bowel.   On previous discussions we discussed that she had to lose weight and get down to the 220s.  She states that her hernia has gotten larger since her last clinic visit.  She is continue to deal with depression.  She states that she has had significant mobility issues secondary to arthritis.   She states that her hernia is causing her some significant pain and discomfort that is not improving.  She does feel that the hernia is getting larger.       Review of Systems: A complete review of systems was obtained from the patient.  I have reviewed this information and discussed as appropriate with the patient.  See HPI as well for other ROS.   Review of Systems  Constitutional: Negative for fever.  HENT: Negative for congestion.   Eyes: Negative for blurred vision.  Respiratory: Negative for cough, shortness of breath and wheezing.   Cardiovascular: Negative for chest pain and palpitations.  Gastrointestinal: Negative for heartburn.  Genitourinary: Negative for dysuria.  Musculoskeletal: Negative for myalgias.  Skin: Negative for rash.  Neurological: Negative for dizziness and headaches.  Psychiatric/Behavioral: Negative for depression and suicidal ideas.  All other systems reviewed and are negative.       Medical History: Past Medical History Past Medical  History: Diagnosis Date  Anxiety    Arthritis    Chronic kidney disease    History of cancer    Hypertension    Sleep apnea    Thyroid disease        There is no problem list on file for this patient.     Past Surgical History Past Surgical History: Procedure Laterality Date  NEPHRECTOMY N/A    THYROIDECTOMY TOTAL N/A        Allergies Allergies Allergen Reactions  Ketorolac Other (See Comments)     Chest pains Chest pains    Lithium Other (See Comments) and Nausea     Off balance, increased heart rate Off balance, increased heart rate    Ziprasidone Other (See Comments)     Extremely agitated Extreme agitation Extremely agitated    Brexpiprazole Other (See Comments) and Swelling     Elevated BP, created aggression Elevated BP, created aggression    Fluconazole Other (See Comments) and Nausea And Vomiting  Lurasidone Other (See Comments) and Unknown     Reports made her mind race more and irritable  Reports made her mind race more and made her irritable     Prochlorperazine Nausea And Vomiting  Tramadol Other (See Comments)      Current Outpatient Medications on File Prior to Visit Medication Sig Dispense Refill  b complex vitamins tablet Take 1 tablet by mouth once daily      levothyroxine (SYNTHROID) 75 MCG tablet TAKE 1 TABLET BY MOUTH EVERY MORNING ON AN EMPTY  STOMACH 90      losartan-hydrochlorothiazide (HYZAAR) 50-12.5 mg tablet 1 tablet      metoprolol tartrate (LOPRESSOR) 25 MG tablet 1 tablet with food      ondansetron (ZOFRAN-ODT) 8 MG disintegrating tablet Take by mouth      polyethylene glycol (MIRALAX) powder Take by mouth      predniSONE (DELTASONE) 10 mg tablet pack TAKE 6 TABLETS ON DAY 1 AS DIRECTED ON PACKAGE AND DECREASE BY 1 TAB EACH DAY FOR A TOTAL OF 6 DAYS      promethazine (PHENERGAN) 12.5 MG tablet TAKE 1 TABLET THREE TIMES A DAY AS NEEDED FOR NAUSEA 3 DAYS      rOPINIRole (REQUIP) 0.5 MG tablet 2 tablets      rosuvastatin  (CRESTOR) 5 MG tablet Take 1 tablet (5 mg total) by mouth once daily      traZODone (DESYREL) 100 MG tablet Take by mouth       No current facility-administered medications on file prior to visit.     Family History Family History Problem Relation Age of Onset  Stroke Mother    Heart valve disease Mother    Skin cancer Father    Colon cancer Father    Obesity Sister        Social History   Tobacco Use Smoking Status Never Smokeless Tobacco Never     Social History Social History    Socioeconomic History  Marital status: Single Tobacco Use  Smoking status: Never  Smokeless tobacco: Never Vaping Use  Vaping Use: Never used Substance and Sexual Activity  Alcohol use: Never  Drug use: Never      Objective:     Vitals:   08/10/21 1043 Weight: (!) 108.6 kg (239 lb 6.4 oz) Height: 162.6 cm ('5\' 4"'$ )   Body mass index is 41.09 kg/m.   Physical Exam Constitutional:      Appearance: Normal appearance.  HENT:     Head: Normocephalic and atraumatic.     Mouth/Throat:     Mouth: Mucous membranes are moist.     Pharynx: Oropharynx is clear.  Eyes:     General: No scleral icterus.    Pupils: Pupils are equal, round, and reactive to light.  Cardiovascular:     Rate and Rhythm: Normal rate and regular rhythm.     Pulses: Normal pulses.     Heart sounds: No murmur heard.   No friction rub. No gallop.  Pulmonary:     Effort: Pulmonary effort is normal. No respiratory distress.     Breath sounds: Normal breath sounds. No stridor.  Abdominal:     General: Abdomen is flat.    Musculoskeletal:        General: No swelling.  Skin:    General: Skin is warm.  Neurological:     General: No focal deficit present.     Mental Status: She is alert and oriented to person, place, and time. Mental status is at baseline.  Psychiatric:        Mood and Affect: Mood normal.        Thought Content: Thought content normal.        Judgment: Judgment normal.        Hernia  Size: 10 cm Incarcerated: Yes Initial Hernia     Assessment and Plan: Diagnoses and all orders for this visit:   Incisional hernia without obstruction or gangrene     Patient is a 76 year old female with a incarcerated incisional hernia. I long discussion with  patient regards her hernia and treatments.  It appears that the hernia is enlarging.  She is having difficulty with weight loss.  She has lost some weight since her last clinic visit however feels that the hernia is impeding her weight loss.   I do believe that the hernia likely will continue to enlarge we do not repair this sooner than later.         [name] is a 76 y.o. female    1.  We will proceed to the OR for a open incisional hernia repair with mesh, lysis of adhesions.. 2. All risks and benefits were discussed with the patient, to generally include infection, bleeding, damage to surrounding structures, acute and chronic nerve pain, and recurrence. Alternatives were offered and described.  All questions were answered and the patient voiced understanding of the procedure and wishes to proceed at this point.             No follow-ups on file.   Ralene Ok, MD, [facility], [facility]

## 2021-08-11 NOTE — Telephone Encounter (Signed)
Primary Cardiologist:Bridgette Harrell Gave, MD  Chart reviewed as part of pre-operative protocol coverage. Because of [name] past medical history and time since last visit, he/she will require a virtual visit/telephone call in order to better assess preoperative cardiovascular risk.  Pre-op covering staff: - Please contact patient, obtain consent, and schedule appointment   If applicable, this message will also be routed to pharmacy pool and/or primary cardiologist for input on holding anticoagulant/antiplatelet agent as requested below so that this information is available at time of patient's appointment.   Emmaline Life, NP-C    08/11/2021, 11:21 AM [place] [address], [address] Office [phone] Fax [phone]

## 2021-08-11 NOTE — Telephone Encounter (Signed)
1st attempt to reach pt to schedule visit for clearance. Lvm

## 2021-08-12 ENCOUNTER — Other Ambulatory Visit (HOSPITAL_COMMUNITY): Payer: Self-pay | Admitting: General Surgery

## 2021-08-12 ENCOUNTER — Other Ambulatory Visit: Payer: Self-pay | Admitting: General Surgery

## 2021-08-12 DIAGNOSIS — K8020 Calculus of gallbladder without cholecystitis without obstruction: Secondary | ICD-10-CM

## 2021-08-13 ENCOUNTER — Telehealth (HOSPITAL_COMMUNITY): Payer: Self-pay | Admitting: General Surgery

## 2021-08-13 NOTE — Telephone Encounter (Signed)
08/13/21~Appt conf w patient to arr 30 mins prior @ [facility]. NPO 6 hrs prior. NO Opiates 6 hrs prior. MF

## 2021-08-14 ENCOUNTER — Ambulatory Visit (HOSPITAL_COMMUNITY)
Admission: RE | Admit: 2021-08-14 | Discharge: 2021-08-14 | Disposition: A | Discharge status: Home / Self Care | Payer: Medicare Other | Source: Ambulatory Visit | Attending: General Surgery | Admitting: General Surgery

## 2021-08-14 DIAGNOSIS — K8020 Calculus of gallbladder without cholecystitis without obstruction: Secondary | ICD-10-CM | POA: Diagnosis not present

## 2021-08-14 DIAGNOSIS — R1011 Right upper quadrant pain: Secondary | ICD-10-CM | POA: Diagnosis not present

## 2021-08-14 MED ORDER — TECHNETIUM TC 99M MEBROFENIN IV KIT
5.1000 | PACK | Freq: Once | INTRAVENOUS | Status: AC | PRN
  Administered 2021-08-14: 5.1 via INTRAVENOUS

## 2021-08-18 NOTE — Telephone Encounter (Signed)
Left message x 2 to call back to schedule a tele pre op appt

## 2021-08-20 ENCOUNTER — Other Ambulatory Visit: Payer: Self-pay | Admitting: Internal Medicine

## 2021-08-20 NOTE — Telephone Encounter (Signed)
Left message x 3 to call back to schedule a telephone appt for pre op assessment. I will update the requesting office that we have tried to reach the pt for an appt. I will send the pt a letter to call the office. Will remove from the pre op call back pool. Will re-address once the pt calls back for appt

## 2021-08-21 DIAGNOSIS — M545 Low back pain, unspecified: Secondary | ICD-10-CM | POA: Diagnosis not present

## 2021-08-24 DIAGNOSIS — M545 Low back pain, unspecified: Secondary | ICD-10-CM | POA: Diagnosis not present

## 2021-08-25 ENCOUNTER — Ambulatory Visit (HOSPITAL_BASED_OUTPATIENT_CLINIC_OR_DEPARTMENT_OTHER): Payer: Medicare Other | Admitting: Family

## 2021-08-25 NOTE — Progress Notes (Deleted)
Office Visit    Patient Name: [name] Date of Encounter: 08/25/2021  PCP:  Lawerance Cruel, [place]  Cardiologist:  Buford Dresser, MD  Advanced Practice Provider:  No care team member to display Electrophysiologist:  None      Chief Complaint    CALAYAH [name] is a 76 y.o. 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 FFR of LAD  not performed because of difficulty with catheter control from the right radial.    Chronic cough 02/22/2020   Chronic kidney disease    hx of kidney cancer   Diastolic dysfunction, left ventricle 05/31/2013   Difficult intubation    told by [facility] that she was hard to intubate 15 yrs ago in [place]- surgery since then no problems   Dyspnea    Essential hypertension 05/31/2013   Generalized anxiety disorder    Adequate for discharge    GERD (gastroesophageal reflux disease)    History of attention deficit disorder    History of echocardiogram 07/2012   Normal LVF w grade I siastolic dysfunction    History of endometriosis    History of gall stones 12/31/2009   History of pleural effusion    Hyperlipidemia 06/24/2016   LDL goal <70   Hypertension    Hypothyroidism 1990   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 Elsworth Soho discussed inspire device, patient would need to lose weight   PAT (paroxysmal atrial tachycardia)    s/p ablation   PONV (postoperative nausea and vomiting)    PTSD (post-traumatic stress disorder)    Pure hypercholesterolemia 02/18/2019   PVC's (premature ventricular contractions)    Renal mass 02/15/2012   Restless legs syndrome (RLS) 02/20/2007   Vertigo    Past Surgical History:  Procedure Laterality Date   CARDIAC ELECTROPHYSIOLOGY MAPPING AND ABLATION  2000s   LAPAROSCOPIC NEPHRECTOMY, HAND ASSISTED Right 07/31/2019   Procedure: HAND ASSISTED LAPAROSCOPIC NEPHRECTOMY CONVERTED TO OPEN WITH REPAIR OF INFERIOR VENA CAVAOTOMY;  Surgeon: Robley Fries, MD;  Location: [facility];  Service: Urology;  Laterality: Right;  3 HRS   laparoscopy     LEFT HEART CATH AND CORONARY ANGIOGRAPHY N/A 06/08/2016   Procedure: Left Heart Cath and Coronary Angiography;  Surgeon: Belva Crome, MD;  Location: [facility];  Service: Cardiovascular;  Laterality: N/A;   nasoseptal reconstruction  1990s   NEPHRECTOMY  RECIPIENT Right 08/2019   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   [place]     as a child   TUBAL LIGATION  1970s   uterine mass removal  03/2012   was found to be benign    Allergies  Allergies  Allergen Reactions   Geodon [Ziprasidone Hydrochloride] Other (See Comments)    Extremely agitated   Lithium Nausea Only and Other (See Comments)    Off balance, increased heart rate   Talwin [Pentazocine] Other (See Comments)    Hallucinations    Toradol [Ketorolac Tromethamine] Other (See Comments)    Chest pains    Ziprasidone Other (See Comments)    Extreme agitation   Abilify [Aripiprazole] Other (See Comments)    jerking   Compazine [Prochlorperazine Edisylate] Nausea And Vomiting   Latuda [Lurasidone Hcl] Other (See Comments)    Reports made her mind race more and made her irritable    Pristiq [Desvenlafaxine Succinate Er] Other (See Comments)    Did not work, prefers not to take   Rexulti [Brexpiprazole] Other (See Comments) and Hypertension    Elevated BP, created aggression   Diflucan [Fluconazole] Nausea And Vomiting    History of Present Illness    [name] is a 76 y.o. female with a hx of *** last seen 10/20/20.  Prior cardiac cath 2018 with CAD recommended for medical management. Initially seen 01/2019 by Dr. Harrell Gave, prior patient of Dr. Radford Pax. Last seen 10/20/20 after ED visit for heart pounding which she associated with anxiety. Her blood pressure was elevated at home.   Presents today for preop clearance for incisional hernia repair. ***  EKGs/Labs/Other Studies Reviewed:   The following studies were reviewed today:  Lexiscan Myoview 08/12/2020: The left ventricular ejection fraction is normal (55-65%). Nuclear stress EF: 63%. There was no ST segment deviation noted during stress. No T wave inversion was noted during stress. The study is normal. This is a low risk study.   1.  Normal study without evidence of ischemia or infarction. 2.  Normal left ventricular function, EF 63%. 3.  This is a low risk study.   Monitor 03/20/2019: 7 days of data recorded on Zio monitor. Patient had a min HR of 43 bpm, max HR of 193 bpm, and avg HR of 6 bpm. Predominant underlying rhythm was Sinus Rhythm. No VT, atrial fibrillation, high degree block, or pauses noted. One true SVT noted, 8 beats at 193 bpm. Two other events labeled as SVT are either sinus (1) or difficult to determine (1). Isolated atrial and ventricular ectopy was rare (<1%). There were 42 triggered events, the vast  majority of which were sinus rhythm with or without brief ectopy. There was significant artifact on many on the triggered events. There was one episode of ventricular trigeminy lasting approximately 10 seconds. No high risk findings.   Cath 06/08/2016 50-70% stenosis in the mid LAD proximal to the first diagonal. FFR not performed because of difficulty with catheter control from the right radial.  75-80% stenosis in the midsegment of the first obtuse marginal.  Luminal irregularities in the mid RCA.  Overall normal LV function with EF percent.    Recommendations:    Aggressive risk factor modification and anti-ischemic therapy ( beta blocker, statin therapy and possibly LA nitrates) in this non-ACS subset. PCI in this setting does not meet appropriate use criteria.  If limiting symptoms on medications or convincing evidence of ischemia, consider obtuse marginal PCI and/or higher  than usual risk PCI on the LAD (due to calcification and angulation).    Lexiscan 09/19/2014 Nuclear stress EF: 70%. Horizontal ST segment depression ST segment depression of 1 mm was noted during stress in the V5, V6, aVF, II, III and I leads, and returning to baseline after 5-9 minutes of recovery. The study is normal. This is a low risk study. The left ventricular ejection fraction is hyperdynamic (>65%).   Low risk stress nuclear study with normal perfusion and normal left ventricular regional and global systolic function. Abnormal ECG response may represent a "false positive".   Echo 04/22/2013 - Left ventricle: The cavity size was normal. Systolic    function was normal. The estimated ejection fraction was    in the range of 55% to 60%. Wall motion was normal; there    were no regional wall motion abnormalities. Left    ventricular diastolic function parameters were normal.  - Pulmonary arteries: PA peak pressure: 25m Hg (S).     EKG:  EKG is ordered today.  The ekg ordered today demonstrates ***  Recent  Labs: 09/04/2020: ALT 15; B Natriuretic Peptide 1,009.1; Magnesium 2.2 07/28/2021: BUN 28; Creatinine, Ser 1.49; Hemoglobin 12.9; Platelets 249; Potassium 3.7; Sodium 140; TSH 2.200  Recent Lipid Panel    Component Value Date/Time   CHOL 177 05/26/2018 0625   TRIG 68 05/26/2018 0625   HDL 47 05/26/2018 0625   CHOLHDL 3.8 05/26/2018 0625   VLDL 14 05/26/2018 0625   LDLCALC 116 (H) 05/26/2018 0625    Home Medications   No outpatient medications have been marked as taking for the 08/25/21 encounter (Appointment) with WLoel Dubonnet NP.     Review of Systems      All other systems reviewed and are otherwise negative except as noted above.  Physical Exam    VS:  There were no vitals taken for this visit. , BMI There is no height or weight on file to calculate BMI.  Wt Readings from Last 3 Encounters:  07/28/21 230 lb (104.3 kg)  04/30/21 241 lb 8 oz (109.5 kg)  02/26/21 235 lb (106.6 kg)     GEN: Well nourished, well developed, in no acute distress. HEENT: normal. Neck: Supple, no JVD, carotid bruits, or masses. Cardiac: ***RRR, no murmurs, rubs, or gallops. No clubbing, cyanosis, edema.  ***Radials/PT 2+ and equal bilaterally.  Respiratory:  ***Respirations regular and unlabored, clear to auscultation bilaterally. GI: Soft, nontender, nondistended. MS: No deformity or atrophy. Skin: Warm and dry, no rash. Neuro:  Strength and sensation are intact. Psych: Normal affect.  Assessment & Plan    Preop clearance -   CAD -   HTN -   HLD - 07/2020 LDL 101. ***     Disposition: Follow up {follow up:15908} with BBuford Dresser MD or APP.  Signed, CLoel Dubonnet NP 08/25/2021, 8:29 AM [place]

## 2021-09-02 ENCOUNTER — Telehealth: Payer: Self-pay | Admitting: *Deleted

## 2021-09-02 NOTE — Telephone Encounter (Signed)
[name] states she has been having a lot of back and joint pain over the past month. Dr Lona Kettle ordered a PET scan but it was denied. He suggested that she call Dr Alen Blew. He was concerned that pain might be cancer related. She has an appt with Dr Alen Blew 10/29/21.

## 2021-09-03 NOTE — Telephone Encounter (Signed)
LM with note below 

## 2021-09-07 NOTE — Telephone Encounter (Signed)
Pt called back to schedule a tele pre op appt, but operator hung up the accidentally when she tried to transfer the call. I called the pt right back and got her vm. Left message to call back and ask to s/w the pre op team.

## 2021-09-07 NOTE — Telephone Encounter (Signed)
Pt returned call to schedule tele preop visit.

## 2021-09-08 DIAGNOSIS — M5459 Other low back pain: Secondary | ICD-10-CM | POA: Diagnosis not present

## 2021-09-08 DIAGNOSIS — R531 Weakness: Secondary | ICD-10-CM | POA: Diagnosis not present

## 2021-09-08 DIAGNOSIS — M797 Fibromyalgia: Secondary | ICD-10-CM | POA: Diagnosis not present

## 2021-09-08 DIAGNOSIS — M47816 Spondylosis without myelopathy or radiculopathy, lumbar region: Secondary | ICD-10-CM | POA: Diagnosis not present

## 2021-09-08 NOTE — Telephone Encounter (Signed)
Left message for to call back so that we may set up a quick tele pre op appt for pre op clearance. I will update the requesting office as to where we are with the pre op clearance at this point. Pt has not been cleared at this time until we can do a tele visit with her for assessment.

## 2021-09-09 NOTE — Telephone Encounter (Signed)
3rd attempt to reach pt to set up televisit, left another message for pt to call back and ask for the preop team.

## 2021-09-10 DIAGNOSIS — M5459 Other low back pain: Secondary | ICD-10-CM | POA: Diagnosis not present

## 2021-09-10 DIAGNOSIS — M47816 Spondylosis without myelopathy or radiculopathy, lumbar region: Secondary | ICD-10-CM | POA: Diagnosis not present

## 2021-09-10 DIAGNOSIS — R531 Weakness: Secondary | ICD-10-CM | POA: Diagnosis not present

## 2021-09-10 DIAGNOSIS — M797 Fibromyalgia: Secondary | ICD-10-CM | POA: Diagnosis not present

## 2021-09-11 NOTE — Telephone Encounter (Signed)
Our office has tried to reach the pt x 3 again. I will send another letter to the pt that she needs to call us back to set up a tele pre op appt. I will once again notify the requesting office the pt needs a tele visit and we have attempted x 3 tor reach the pt again. I will remove from the pre op call back again. We will re-address once the pt calls back.

## 2021-09-21 DIAGNOSIS — M797 Fibromyalgia: Secondary | ICD-10-CM | POA: Diagnosis not present

## 2021-09-21 DIAGNOSIS — M47816 Spondylosis without myelopathy or radiculopathy, lumbar region: Secondary | ICD-10-CM | POA: Diagnosis not present

## 2021-09-21 DIAGNOSIS — M5459 Other low back pain: Secondary | ICD-10-CM | POA: Diagnosis not present

## 2021-09-21 DIAGNOSIS — R531 Weakness: Secondary | ICD-10-CM | POA: Diagnosis not present

## 2021-09-24 NOTE — Progress Notes (Signed)
NEUROLOGY FOLLOW UP OFFICE NOTE  LAYTON TAPPAN [id number]  Assessment/Plan:   Major depressive disorder - this has been a cause for multiple somatic symptoms such as numbness, balance problems, weakness, and subjective memory complaints.  She will continue working with her psychiatrist and therapist.       Subjective:  ANNASTYN SILVEY is a 76 year old female with CAD, HTN, hypothyroidism, anxiety, PTSD and CKD with history of kidney cancer who follows up for weakness.  UPDATE: Last seen in October 2022.  At that time, underwent workup for neuropathy.  B12 on 01/15/2021 was 938.  NCV-EMG on 03/03/2021 was normal.  She continued to not feel well.  She continues to feel weak.  She has chronic pain.  She was hospitalized due to worsening depression.  Continues to be uncontrolled.  Continues to have generalized pain.  Cymbalta helps a little bit.     HISTORY: Patient has history of vertigo.  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  She has history of reported brain fog and short term memory problems since at least 2011.  MRI of brain with and without contrast on 02/12/2010 to assess confusion was personally reviewed and was unremarkable, as was a subsequent MRI of brain without contrast, personally reviewed, from 04/20/2013.  Neuropsychological evaluation on 08/22/2020 demonstrated some isolated impairments but overall unremarkable with findings likely related to her psychiatric comorbidities (ADHD, depression, anxiety) rather than underlying neurocognitive disorder.  She reports balance problems over the past year.  She feels weak in the legs.  However, she has history of chronic pain in the joints, such as the hips and knees.  She has arthritis in the right knee. Since early 2022, she reports numbness and tingling as well as pain in the feet and tingling and numbness in the hands.  She has acquired hypothyroidism which is treated and monitored.  TSH from  June 2022 was 1.485.  She has low back pain and aching in the legs.  Denies neck pain.  Over the summer 2022, it looked like that she may have had mild facial asymmetry, with slight droop on the right.  MRI of brain without contrast on 11/05/2020 personally reviewed showed no acute intracranial abnormality.  It has since resolved.  She reports one other episode in the past.  She is concerned about her balance and leg weakness because her sister, who has MS, has similar symptoms.  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 FFR of LAD  not performed because of difficulty with catheter control from the right radial.    Chronic cough 02/22/2020   Chronic kidney disease    hx of kidney cancer   Diastolic dysfunction, left ventricle 05/31/2013   Difficult intubation    told by [facility] that she was hard to intubate 15 yrs ago in [place]- surgery since then no problems   Dyspnea    Essential hypertension 05/31/2013   Generalized anxiety disorder  Adequate for discharge    GERD (gastroesophageal reflux disease)    History of attention deficit disorder    History of echocardiogram 07/2012   Normal LVF w grade I siastolic dysfunction    History of endometriosis    History of gall stones 12/31/2009   History of pleural effusion    Hyperlipidemia 06/24/2016   LDL goal <70   Hypertension    Hypothyroidism 1990   after partial thyroidectomy for thyroid adenoma   Major depressive disorder     Migraine headaches    Obesity    Obstructive sleep apnea 02/20/2007   NPSG 2004:  AHI 10/hr Failed cpap trials.>dental appliance   Split night 06/2015 >Moderate obstructive sleep apnea occurred during this study  (AHI = 22.7/h).>rec CPAP >> poor compliance  HST 11/2016 AHI 12/h, 7h TST  03/2018 -office visit with Dr. Elsworth Soho discussed inspire device, patient would need to lose weight   PAT (paroxysmal atrial tachycardia)    s/p ablation   PONV (postoperative nausea and vomiting)    PTSD (post-traumatic stress disorder)    Pure hypercholesterolemia 02/18/2019   PVC's (premature ventricular contractions)    Renal mass 02/15/2012   Restless legs syndrome (RLS) 02/20/2007   Vertigo     MEDICATIONS: Current Outpatient Medications on File Prior to Visit  Medication Sig Dispense Refill   acetaminophen (TYLENOL) 500 MG tablet Take 500-1,000 mg by mouth every 6 (six) hours as needed for mild pain (or headaches). (Patient not taking: Reported on 04/30/2021)     aspirin 81 MG EC tablet 1 tablet     b complex vitamins tablet Take 1 tablet by mouth daily.     Cholecalciferol (VITAMIN D3) 125 MCG (5000 UT) CAPS Take 5,000 Units by mouth in the morning.     diphenhydrAMINE (BENADRYL) 25 mg capsule Take 25 mg by mouth every 6 (six) hours as needed for allergies.     FLUoxetine (PROZAC) 20 MG capsule Take 20 mg by mouth daily.     gatifloxacin (ZYMAXID) 0.5 % SOLN Place 1 drop into the right eye 4 (four) times daily.     ipratropium (ATROVENT) 0.06 % nasal spray Place 2 sprays into both nostrils in the morning.     levothyroxine (SYNTHROID) 88 MCG tablet Take 88 mcg by mouth every morning.     LORazepam (ATIVAN) 0.5 MG tablet Take 1 tablet (0.5 mg total) by mouth daily as needed for anxiety. 30 tablet 0   losartan-hydrochlorothiazide (HYZAAR) 50-12.5 MG tablet Take 1 tablet by mouth 2 (two) times daily. 180 tablet 3   methylphenidate (RITALIN) 5 MG tablet Take 5 mg by mouth 2 (two) times daily.     metoprolol  tartrate (LOPRESSOR) 25 MG tablet Take 25 mg by mouth 2 (two) times daily.     MINERAL ICE 2 % GEL Apply 1 application topically at bedtime as needed (to painful sites).     Omega 3 1000 MG CAPS Take 1,000 mg by mouth daily.     ondansetron (ZOFRAN-ODT) 8 MG disintegrating tablet Take 8 mg by mouth every 8 (eight) hours as needed for nausea or vomiting (dissolve orally).     polyethylene glycol powder (GLYCOLAX/MIRALAX) 17 GM/SCOOP powder Take 17 g by mouth See admin instructions. Mix 17 grams of powder into a fruit smoothie and drink every morning (Patient not taking: Reported on 04/30/2021)     prednisoLONE acetate (PRED FORTE) 1 % ophthalmic suspension SMARTSIG:In Eye(s)     predniSONE (DELTASONE) 20 MG tablet Take by mouth.  promethazine (PHENERGAN) 25 MG tablet SMARTSIG:1 Tablet(s) By Mouth Every 12 Hours PRN     rOPINIRole (REQUIP) 0.5 MG tablet TAKE 1 TABLET BY MOUTH EVERY DAY AFTER SUPPER AND TAKE 2 TABLETS AT BEDTIME 90 tablet 10   rosuvastatin (CRESTOR) 5 MG tablet Take 1 tablet (5 mg total) by mouth daily. 90 tablet 3   SALONPAS 3.03-27-08 % PTCH Apply 1 patch topically See admin instructions. Apply 1 patch to affected sites one to two times a day as needed for pain     traMADol (ULTRAM) 50 MG tablet SMARTSIG:0.5-1 Tablet(s) By Mouth 2-3 Times Daily PRN     vitamin C (ASCORBIC ACID) 500 MG tablet Take 500 mg by mouth daily.     No current facility-administered medications on file prior to visit.    ALLERGIES: Allergies  Allergen Reactions   Geodon [Ziprasidone Hydrochloride] Other (See Comments)    Extremely agitated   Lithium Nausea Only and Other (See Comments)    Off balance, increased heart rate   Talwin [Pentazocine] Other (See Comments)    Hallucinations    Toradol [Ketorolac Tromethamine] Other (See Comments)    Chest pains   Ziprasidone Other (See Comments)    Extreme agitation   Abilify [Aripiprazole] Other (See Comments)    jerking   Compazine [Prochlorperazine  Edisylate] Nausea And Vomiting   Latuda [Lurasidone Hcl] Other (See Comments)    Reports made her mind race more and made her irritable    Pristiq [Desvenlafaxine Succinate Er] Other (See Comments)    Did not work, prefers not to take   Rexulti [Brexpiprazole] Other (See Comments) and Hypertension    Elevated BP, created aggression   Diflucan [Fluconazole] Nausea And Vomiting    FAMILY HISTORY: Family History  Problem Relation Age of Onset   Allergies Father    Skin cancer Father    Alcohol abuse Father    Mental illness Father    Heart disease Mother    Depression Mother    Hypertension Mother    CAD Mother    Colon cancer Paternal Grandmother    OCD Paternal Grandmother    Multiple sclerosis Sister    Autoimmune disease Brother        unknown type   Allergies Daughter    Heart disease Maternal Grandfather    Cancer Paternal Grandfather    Parkinson's disease Paternal Grandfather    Mental illness Maternal Grandmother       Objective:  Blood pressure 113/75, pulse 70, height '5\' 3"'$  (1.6 m), weight 235 lb (106.6 kg), SpO2 97 %. General: No acute distress.  Patient appears well-groomed.   Head:  Normocephalic/atraumatic Eyes:  Fundi examined but not visualized Heart:  Regular rate and rhythm Neurological Exam: alert and oriented to person, place, and time.  Speech fluent and not dysarthric, language intact.  CN II-XII intact. Bulk and tone normal, muscle strength 5/5 throughout.  Sensation to light touch intact.  Deep tendon reflexes 2+ throughout.  Finger to nose testing intact.  Gait mildly broad-based. Romberg negative.   Metta Clines, DO  CC: C.Melinda Crutch, MD

## 2021-09-25 ENCOUNTER — Ambulatory Visit (INDEPENDENT_AMBULATORY_CARE_PROVIDER_SITE_OTHER): Payer: Medicare Other | Admitting: Neurology

## 2021-09-25 ENCOUNTER — Encounter: Payer: Self-pay | Admitting: Neurology

## 2021-09-25 VITALS — BP 113/75 | HR 70 | Ht 63.0 in | Wt 235.0 lb

## 2021-09-25 DIAGNOSIS — F411 Generalized anxiety disorder: Secondary | ICD-10-CM | POA: Diagnosis not present

## 2021-09-25 DIAGNOSIS — F332 Major depressive disorder, recurrent severe without psychotic features: Secondary | ICD-10-CM | POA: Diagnosis not present

## 2021-10-01 ENCOUNTER — Ambulatory Visit (HOSPITAL_BASED_OUTPATIENT_CLINIC_OR_DEPARTMENT_OTHER): Payer: Medicare Other | Admitting: Family

## 2021-10-06 ENCOUNTER — Ambulatory Visit (HOSPITAL_BASED_OUTPATIENT_CLINIC_OR_DEPARTMENT_OTHER): Payer: Medicare Other | Admitting: Family

## 2021-10-07 NOTE — Progress Notes (Deleted)
Office Visit    Patient Name: [name] Date of Encounter: 10/07/2021  PCP:  Lawerance Cruel, [place]  Cardiologist:  Buford Dresser, MD  Advanced Practice Provider:  No care team member to display Electrophysiologist:  None      Chief Complaint    [name] is a 76 y.o. 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 FFR of LAD  not performed because of difficulty with catheter control from the right radial.    Chronic cough 02/22/2020   Chronic kidney disease    hx of kidney cancer   Diastolic dysfunction, left ventricle 05/31/2013   Difficult intubation    told by [facility] that she was hard to intubate 15 yrs ago in [place]- surgery since then no problems   Dyspnea    Essential hypertension 05/31/2013   Generalized anxiety disorder    Adequate for discharge    GERD (gastroesophageal reflux disease)    History of attention deficit disorder    History of echocardiogram 07/2012   Normal LVF w grade I siastolic dysfunction    History of endometriosis    History of gall stones 12/31/2009   History of pleural effusion    Hyperlipidemia 06/24/2016   LDL goal <70   Hypertension    Hypothyroidism 1990   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 Elsworth Soho discussed inspire device, patient would need to lose weight   PAT (paroxysmal atrial tachycardia)    s/p ablation   PONV (postoperative nausea and vomiting)    PTSD (post-traumatic stress disorder)    Pure hypercholesterolemia 02/18/2019   PVC's (premature ventricular contractions)    Renal mass 02/15/2012   Restless legs syndrome (RLS) 02/20/2007   Vertigo    Past Surgical History:  Procedure Laterality Date   CARDIAC ELECTROPHYSIOLOGY MAPPING AND ABLATION  2000s   LAPAROSCOPIC NEPHRECTOMY, HAND ASSISTED Right 07/31/2019   Procedure: HAND ASSISTED LAPAROSCOPIC NEPHRECTOMY CONVERTED TO OPEN WITH REPAIR OF INFERIOR VENA CAVAOTOMY;  Surgeon: Robley Fries, MD;  Location: [facility];  Service: Urology;  Laterality: Right;  3 HRS   laparoscopy     LEFT HEART CATH AND CORONARY ANGIOGRAPHY N/A 06/08/2016   Procedure: Left Heart Cath and Coronary Angiography;  Surgeon: Belva Crome, MD;  Location: [facility];  Service: Cardiovascular;  Laterality: N/A;   nasoseptal reconstruction  1990s   NEPHRECTOMY RECIPIENT Right 08/2019   right kidney removal due to [place]  TONSILLECTOMY     as a child   TUBAL LIGATION  1970s   uterine mass removal  03/2012   was found to be benign    Allergies  Allergies  Allergen Reactions   Geodon [Ziprasidone Hydrochloride] Other (See Comments)    Extremely agitated   Lithium Nausea Only and Other (See Comments)    Off balance, increased heart rate   Talwin [Pentazocine] Other (See Comments)    Hallucinations    Toradol [Ketorolac Tromethamine] Other (See Comments)    Chest pains   Ziprasidone Other (See Comments)    Extreme agitation   Abilify [Aripiprazole] Other (See Comments)    jerking    Compazine [Prochlorperazine Edisylate] Nausea And Vomiting   Latuda [Lurasidone Hcl] Other (See Comments)    Reports made her mind race more and made her irritable    Pristiq [Desvenlafaxine Succinate Er] Other (See Comments)    Did not work, prefers not to take   Rexulti [Brexpiprazole] Other (See Comments) and Hypertension    Elevated BP, created aggression   Diflucan [Fluconazole] Nausea And Vomiting    History of Present Illness    [name] is a 76 y.o. female with a hx of PVC, PAT s/p ablation, HTN, CAD, fibromyalgia, renal cell carcinoma s/p nephrectomy last seen 10/20/20.   Established with Dr. Harrell Gave 01/2019. Previously followed by Dr. Radford Pax. Prior LHC 2018 nonobstructive disease. Myoview 08/12/20 low risk study. At last clinic visit 10/2020 noted 2 ED visits with sensation of heart pounding.   She presents today for follow up. ***  EKGs/Labs/Other Studies Reviewed:   The following studies were reviewed today:   Lexiscan Myoview 08/12/2020: The left ventricular ejection fraction is normal (55-65%). Nuclear stress EF: 63%. There was no ST segment deviation noted during stress. No T wave inversion was noted during stress. The study is normal. This is a low risk study.   1.  Normal study without evidence of ischemia or infarction. 2.  Normal left ventricular function, EF 63%. 3.  This is a low risk study.   Monitor 03/20/2019: 7 days of data recorded on Zio monitor. Patient had a min HR of 43 bpm, max HR of 193 bpm, and avg HR of 6 bpm. Predominant underlying rhythm was Sinus Rhythm. No VT, atrial fibrillation, high degree block, or pauses noted. One true SVT noted, 8 beats at 193 bpm. Two other events labeled as SVT are either sinus (1) or difficult to determine (1). Isolated atrial and ventricular ectopy was rare (<1%). There were 42 triggered events, the vast majority of which were sinus rhythm with or without brief ectopy. There was significant artifact on many  on the triggered events. There was one episode of ventricular trigeminy lasting approximately 10 seconds. No high risk findings.   Cath 06/08/2016 50-70% stenosis in the mid LAD proximal to the first diagonal. FFR not performed because of difficulty with catheter control from the right radial.  75-80% stenosis in the midsegment of the first obtuse marginal.  Luminal irregularities in the mid RCA.  Overall normal LV function with EF percent.    Recommendations:    Aggressive risk factor modification and anti-ischemic therapy ( beta blocker, statin therapy and possibly LA nitrates) in this non-ACS subset. PCI in this setting does not meet appropriate use criteria.  If limiting symptoms on medications or convincing evidence of ischemia, consider obtuse marginal PCI and/or higher than usual risk PCI on the LAD (due to calcification and angulation).    Lexiscan  09/19/2014 Nuclear stress EF: 70%. Horizontal ST segment depression ST segment depression of 1 mm was noted during stress in the V5, V6, aVF, II, III and I leads, and returning to baseline after 5-9 minutes of recovery. The study is normal. This is a low risk study. The left ventricular ejection fraction is hyperdynamic (>65%).   Low risk stress nuclear study with normal perfusion and normal left ventricular regional and global systolic function. Abnormal ECG response may represent a "false positive".   Echo 04/22/2013 - Left ventricle: The cavity size was normal. Systolic    function was normal. The estimated ejection fraction was    in the range of 55% to 60%. Wall motion was normal; there    were no regional wall motion abnormalities. Left    ventricular diastolic function parameters were normal.  - Pulmonary arteries: PA peak pressure: 37m Hg (S).    EKG:  EKG is ordered today.  The ekg ordered today demonstrates ***  Recent Labs: 07/28/2021: BUN 28; Creatinine, Ser 1.49; Hemoglobin 12.9; Platelets 249; Potassium 3.7; Sodium 140;  TSH 2.200  Recent Lipid Panel    Component Value Date/Time   CHOL 177 05/26/2018 0625   TRIG 68 05/26/2018 0625   HDL 47 05/26/2018 0625   CHOLHDL 3.8 05/26/2018 0625   VLDL 14 05/26/2018 0625   LDLCALC 116 (H) 05/26/2018 0625   Home Medications   No outpatient medications have been marked as taking for the 10/08/21 encounter (Appointment) with WLoel Dubonnet NP.     Review of Systems      All other systems reviewed and are otherwise negative except as noted above.  Physical Exam    VS:  There were no vitals taken for this visit. , BMI There is no height or weight on file to calculate BMI.  Wt Readings from Last 3 Encounters:  09/25/21 235 lb (106.6 kg)  07/28/21 230 lb (104.3 kg)  04/30/21 241 lb 8 oz (109.5 kg)     GEN: Well nourished, well developed, in no acute distress. HEENT: normal. Neck: Supple, no JVD, carotid bruits, or masses. Cardiac: ***RRR, no murmurs, rubs, or gallops. No clubbing, cyanosis, edema.  ***Radials/PT 2+ and equal bilaterally.  Respiratory:  ***Respirations regular and unlabored, clear to auscultation bilaterally. GI: Soft, nontender, nondistended. MS: No deformity or atrophy. Skin: Warm and dry, no rash. Neuro:  Strength and sensation are intact. Psych: Normal affect.  Assessment & Plan    CAD   HLD, LDL goal <70 - Difficulty with statin but tolerates Rosuvastatin '5mg'$  QD.   HTN -   Disposition: Follow up {follow up:[id number]} with BBuford Dresser MD or APP.  Signed, CLoel Dubonnet NP 10/07/2021, 3:47 PM [facility]

## 2021-10-08 ENCOUNTER — Ambulatory Visit (HOSPITAL_BASED_OUTPATIENT_CLINIC_OR_DEPARTMENT_OTHER): Payer: Medicare Other | Admitting: Family

## 2021-10-08 ENCOUNTER — Other Ambulatory Visit: Payer: Self-pay | Admitting: Physician Assistant
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
## 2021-10-12 ENCOUNTER — Encounter (HOSPITAL_BASED_OUTPATIENT_CLINIC_OR_DEPARTMENT_OTHER): Payer: Self-pay | Admitting: Nurse Practitioner

## 2021-10-12 ENCOUNTER — Ambulatory Visit (HOSPITAL_BASED_OUTPATIENT_CLINIC_OR_DEPARTMENT_OTHER): Payer: Self-pay

## 2021-10-12 ENCOUNTER — Ambulatory Visit (INDEPENDENT_AMBULATORY_CARE_PROVIDER_SITE_OTHER): Payer: Medicare Other | Admitting: Nurse Practitioner

## 2021-10-12 VITALS — BP 138/82 | HR 61 | Ht 63.0 in | Wt 237.0 lb

## 2021-10-12 DIAGNOSIS — R002 Palpitations: Secondary | ICD-10-CM

## 2021-10-12 DIAGNOSIS — E785 Hyperlipidemia, unspecified: Secondary | ICD-10-CM

## 2021-10-12 DIAGNOSIS — I2510 Atherosclerotic heart disease of native coronary artery without angina pectoris: Secondary | ICD-10-CM | POA: Diagnosis not present

## 2021-10-12 DIAGNOSIS — I10 Essential (primary) hypertension: Secondary | ICD-10-CM

## 2021-10-12 DIAGNOSIS — F419 Anxiety disorder, unspecified: Secondary | ICD-10-CM | POA: Diagnosis not present

## 2021-10-12 DIAGNOSIS — I471 Supraventricular tachycardia: Secondary | ICD-10-CM

## 2021-10-12 DIAGNOSIS — R6889 Other general symptoms and signs: Secondary | ICD-10-CM

## 2021-10-12 DIAGNOSIS — G4733 Obstructive sleep apnea (adult) (pediatric): Secondary | ICD-10-CM

## 2021-10-12 NOTE — Patient Instructions (Signed)
Medication Instructions:  Your physician has recommended you make the following change in your medication:  PLEASE be sure to take Metoprolol twice per day as prescribed.   *If you need a refill on your cardiac medications before your next appointment, please call your pharmacy*   Lab Work: Your physician recommends that you return for lab work in: CMP, CBC, TSH  If you have labs (blood work) drawn today and your tests are completely normal, you will receive your results only by: [place] (if you have MyChart) OR A paper copy in the mail If you have any lab test that is abnormal or we need to change your treatment, we will call you to review the results.   Testing/Procedures: Your physician has requested that you have an echocardiogram after you finish your monitor in one week. Echocardiography is a painless test that uses sound waves to create images of your heart. It provides your doctor with information about the size and shape of your heart and how well your heart's chambers and valves are working. This procedure takes approximately one hour. There are no restrictions for this procedure.   Your physician has recommended that you wear a Zio monitor.   This monitor is a medical device that records the heart's electrical activity. Doctors most often use these monitors to diagnose arrhythmias. Arrhythmias are problems with the speed or rhythm of the heartbeat. The monitor is a small device applied to your chest. You can wear one while you do your normal daily activities. While wearing this monitor if you have any symptoms to push the button and record what you felt. Once you have worn this monitor for the period of time provider prescribed (Usually 14 days), you will return the monitor device in the postage paid box. Once it is returned they will download the data collected and provide [place] with a report which the provider will then review and we will call you with those results. Important  tips:  Avoid showering during the first 24 hours of wearing the monitor. Avoid excessive sweating to help maximize wear time. Do not submerge the device, no hot tubs, and no swimming pools. Keep any lotions or oils away from the patch. After 24 hours you may shower with the patch on. Take brief showers with your back facing the shower head.  Do not remove patch once it has been placed because that will interrupt data and decrease adhesive wear time. Push the button when you have any symptoms and write down what you were feeling. Once you have completed wearing your monitor, remove and place into box which has postage paid and place in your outgoing mailbox.  If for some reason you have misplaced your box then call our office and we can provide another box and/or mail it off for you.   Follow-Up: At [facility], you and your health needs are our priority.  As part of our continuing mission to provide you with exceptional heart care, we have created designated Provider Care Teams.  These Care Teams include your primary Cardiologist (physician) and Advanced Practice Providers (APPs -  Physician Assistants and Nurse Practitioners) who all work together to provide you with the care you need, when you need it.  We recommend signing up for the patient portal called "MyChart".  Sign up information is provided on this After Visit Summary.  MyChart is used to connect with patients for Virtual Visits (Telemedicine).  Patients are able to view lab/test results, encounter notes, upcoming  appointments, etc.  Non-urgent messages can be sent to your provider as well.   To learn more about what you can do with MyChart, go to [email or web address].    Your next appointment:   1 month(s)  The format for your next appointment:   In Person  Provider:   Buford Dresser, MD or Laurann Montana, NP    Other Instructions  To prevent palpitations: Make sure you are adequately hydrated.  Avoid and/or  limit caffeine containing beverages like soda or tea. Exercise regularly.  Manage stress well. Some over the counter medications can cause palpitations such as Benadryl, AdvilPM, TylenolPM. Regular Advil or Tylenol do not cause palpitations.    Heart Healthy Diet Recommendations: A low-salt diet is recommended. Meats should be grilled, baked, or boiled. Avoid fried foods. Focus on lean protein sources like fish or chicken with vegetables and fruits. The American Heart Association is a Microbiologist!  American Heart Association Diet and Lifeystyle Recommendations   Exercise recommendations: The American Heart Association recommends 150 minutes of moderate intensity exercise weekly. Try 30 minutes of moderate intensity exercise 4-5 times per week. This could include walking, jogging, or swimming.  Important Information About Sugar

## 2021-10-13 NOTE — Addendum Note (Signed)
Addended by: Gerald Stabs on: 10/13/2021 07:41 AM   Modules accepted: Orders

## 2021-10-19 ENCOUNTER — Encounter (HOSPITAL_BASED_OUTPATIENT_CLINIC_OR_DEPARTMENT_OTHER): Payer: Self-pay

## 2021-10-20 DIAGNOSIS — I10 Essential (primary) hypertension: Secondary | ICD-10-CM | POA: Diagnosis not present

## 2021-10-21 ENCOUNTER — Other Ambulatory Visit (HOSPITAL_BASED_OUTPATIENT_CLINIC_OR_DEPARTMENT_OTHER): Payer: Medicare Other

## 2021-10-21 ENCOUNTER — Telehealth (HOSPITAL_BASED_OUTPATIENT_CLINIC_OR_DEPARTMENT_OTHER): Payer: Self-pay

## 2021-10-21 LAB — CBC
Hematocrit: 38.3 % (ref 34.0–46.6)
Hemoglobin: 12.5 g/dL (ref 11.1–15.9)
MCH: 27.3 pg (ref 26.6–33.0)
MCHC: 32.6 g/dL (ref 31.5–35.7)
MCV: 84 fL (ref 79–97)
Platelets: 228 10*3/uL (ref 150–450)
RBC: 4.58 x10E6/uL (ref 3.77–5.28)
RDW: 13.6 % (ref 11.7–15.4)
WBC: 5 10*3/uL (ref 3.4–10.8)

## 2021-10-21 LAB — TSH: TSH: 0.451 u[IU]/mL (ref 0.450–4.500)

## 2021-10-21 LAB — COMPREHENSIVE METABOLIC PANEL
ALT: 14 IU/L (ref 0–32)
AST: 19 IU/L (ref 0–40)
Albumin/Globulin Ratio: 1.7 (ref 1.2–2.2)
Albumin: 4.1 g/dL (ref 3.8–4.8)
Alkaline Phosphatase: 75 IU/L (ref 44–121)
BUN/Creatinine Ratio: 14 (ref 12–28)
BUN: 14 mg/dL (ref 8–27)
Bilirubin Total: 0.4 mg/dL (ref 0.0–1.2)
CO2: 26 mmol/L (ref 20–29)
Calcium: 9.4 mg/dL (ref 8.7–10.3)
Chloride: 102 mmol/L (ref 96–106)
Creatinine, Ser: 1.01 mg/dL — ABNORMAL HIGH (ref 0.57–1.00)
Globulin, Total: 2.4 g/dL (ref 1.5–4.5)
Glucose: 96 mg/dL (ref 70–99)
Potassium: 3.9 mmol/L (ref 3.5–5.2)
Sodium: 139 mmol/L (ref 134–144)
Total Protein: 6.5 g/dL (ref 6.0–8.5)
eGFR: 58 mL/min/{1.73_m2} — ABNORMAL LOW (ref >59)

## 2021-10-21 NOTE — Telephone Encounter (Addendum)
Called results to patient and left results on VM (ok per DPR), instructions left to call office back if patient has any questions!      ----- Message from Finis Bud, NP sent at 10/21/2021  7:40 AM EDT ----- Please update patient about the following lab work. Recent kidney function has improved, and we will continue to monitor this. Blood counts and Thyroid level are all normal. We will continue the current treatment plan as discussed and see her back in the office in 1 month.  Thanks so much!   Kind Regards,   Finis Bud, AGNP-C

## 2021-10-22 ENCOUNTER — Telehealth (HOSPITAL_BASED_OUTPATIENT_CLINIC_OR_DEPARTMENT_OTHER): Payer: Self-pay | Admitting: Family

## 2021-10-22 NOTE — Telephone Encounter (Signed)
Follow UpL      Pt is returning a call, concerning her lab results.

## 2021-10-22 NOTE — Telephone Encounter (Signed)
Left message to call back  

## 2021-10-23 NOTE — Telephone Encounter (Signed)
Left message for patient to call and discuss rescheduling the Echocardiogram ordered by Finis Bud, NP

## 2021-10-23 NOTE — Telephone Encounter (Signed)
Left detailed voicemail per DPR relaying lab results with improving renal function, normal thyroid, CBC.  Asked her to call us back to schedule echocardiogram as ordered at clinic visit.  Loel Dubonnet, NP

## 2021-10-29 ENCOUNTER — Telehealth: Payer: Self-pay

## 2021-10-29 ENCOUNTER — Inpatient Hospital Stay: Payer: Medicare Other | Attending: Oncology | Admitting: Oncology

## 2021-10-29 NOTE — Telephone Encounter (Signed)
Called and left VM requested pt call scheduling department to rescheduled missed appointment from today.

## 2021-10-29 NOTE — Telephone Encounter (Signed)
Called to discuss rescheduling the Echocardiogram ordered by Laurann Montana, NP---no answer and voice mail is full

## 2021-11-04 ENCOUNTER — Telehealth (HOSPITAL_BASED_OUTPATIENT_CLINIC_OR_DEPARTMENT_OTHER): Payer: Self-pay | Admitting: Family

## 2021-11-04 DIAGNOSIS — N1831 Chronic kidney disease, stage 3a: Secondary | ICD-10-CM | POA: Diagnosis not present

## 2021-11-04 DIAGNOSIS — M1990 Unspecified osteoarthritis, unspecified site: Secondary | ICD-10-CM | POA: Diagnosis not present

## 2021-11-04 DIAGNOSIS — E7800 Pure hypercholesterolemia, unspecified: Secondary | ICD-10-CM | POA: Diagnosis not present

## 2021-11-04 DIAGNOSIS — K219 Gastro-esophageal reflux disease without esophagitis: Secondary | ICD-10-CM | POA: Diagnosis not present

## 2021-11-04 DIAGNOSIS — I10 Essential (primary) hypertension: Secondary | ICD-10-CM | POA: Diagnosis not present

## 2021-11-04 DIAGNOSIS — E039 Hypothyroidism, unspecified: Secondary | ICD-10-CM | POA: Diagnosis not present

## 2021-11-04 NOTE — Telephone Encounter (Signed)
Follow Up:    Patient is calling to get lab results from 10-31-11 please.

## 2021-11-04 NOTE — Telephone Encounter (Signed)
Patient called to ask about her A1c, RN unable to find any orders for an A1c or any mention of it in the chart. Patient going to call PCP and have it drawn.   Patient claims she came to lab fasting and had blood work drawn on 8/11 to check A1C ordered by Dr. Harrell Gave or [place]. Unable to find record of this in the chart, RN to call [facility] to see if they have anything.    RN called [facility] [name] [phone] Account Num [id number]  Per [facility] the only results they have are from 8/1   Patient to call her PCP to have drawn.

## 2021-11-12 ENCOUNTER — Ambulatory Visit (HOSPITAL_BASED_OUTPATIENT_CLINIC_OR_DEPARTMENT_OTHER): Payer: Medicare Other | Admitting: Family

## 2021-11-17 ENCOUNTER — Other Ambulatory Visit (HOSPITAL_BASED_OUTPATIENT_CLINIC_OR_DEPARTMENT_OTHER): Payer: Medicare Other

## 2021-11-17 DIAGNOSIS — J0140 Acute pansinusitis, unspecified: Secondary | ICD-10-CM | POA: Diagnosis not present

## 2021-11-19 ENCOUNTER — Other Ambulatory Visit (HOSPITAL_COMMUNITY): Payer: Medicare Other

## 2021-11-24 ENCOUNTER — Other Ambulatory Visit (HOSPITAL_BASED_OUTPATIENT_CLINIC_OR_DEPARTMENT_OTHER): Payer: Medicare Other

## 2021-11-24 ENCOUNTER — Telehealth: Payer: Self-pay | Admitting: Licensed Clinical Social Worker

## 2021-11-24 DIAGNOSIS — R6883 Chills (without fever): Secondary | ICD-10-CM | POA: Diagnosis not present

## 2021-11-24 DIAGNOSIS — Z03818 Encounter for observation for suspected exposure to other biological agents ruled out: Secondary | ICD-10-CM | POA: Diagnosis not present

## 2021-11-24 DIAGNOSIS — R059 Cough, unspecified: Secondary | ICD-10-CM | POA: Diagnosis not present

## 2021-11-24 DIAGNOSIS — R0981 Nasal congestion: Secondary | ICD-10-CM | POA: Diagnosis not present

## 2021-11-24 DIAGNOSIS — M25561 Pain in right knee: Secondary | ICD-10-CM | POA: Diagnosis not present

## 2021-11-24 NOTE — Patient Outreach (Signed)
  Care Coordination   11/24/2021 Name: KESLIE GRITZ MRN: [id number] DOB: 1945-11-16   Care Coordination Outreach Attempts:  An unsuccessful telephone outreach was attempted today to offer the patient information about available care coordination services as a benefit of their health plan.   Follow Up Plan:  Additional outreach attempts will be made to offer the patient care coordination information and services.   Encounter Outcome:  No Answer  Care Coordination Interventions Activated:  No   Care Coordination Interventions:  No, not indicated    Casimer Lanius, [place] [phone]

## 2021-11-25 ENCOUNTER — Other Ambulatory Visit: Payer: Self-pay | Admitting: Sports Medicine

## 2021-11-25 ENCOUNTER — Telehealth: Payer: Self-pay

## 2021-11-25 ENCOUNTER — Ambulatory Visit: Payer: Self-pay | Admitting: Licensed Clinical Social Worker

## 2021-11-25 DIAGNOSIS — M25561 Pain in right knee: Secondary | ICD-10-CM

## 2021-11-25 NOTE — Patient Instructions (Signed)
     It was a pleasure speaking with you today. per your request your appointment is scheduled 11/26/21  Casimer Lanius, [place] [phone]

## 2021-11-25 NOTE — Telephone Encounter (Signed)
Pt called requesting a new patient appointment with a provider at [facility].

## 2021-11-25 NOTE — Patient Outreach (Signed)
  Care Coordination   Initial Visit Note   11/25/2021 Name: [name] MRN: [id number] DOB: 04/06/1945  [name] is a 76 y.o. year old female who sees Brooke Cruel, MD for primary care. I spoke with  [name] by phone today.  What matters to the patients health and wellness today?  Managing her physical and mental health needs    Goals Addressed             This Visit's Progress    Care Coordination Activities       Care Coordination Interventions: Provided education to patient re: Care Coordination Services          SDOH assessments and interventions completed:  No    Care Coordination Interventions Activated:  Yes  Care Coordination Interventions:  Yes, provided   Follow up plan: Follow up call scheduled for 11/26/21    Encounter Outcome:  Pt. Visit Completed   [name], [place] [phone]

## 2021-11-26 ENCOUNTER — Ambulatory Visit: Payer: Self-pay | Admitting: Licensed Clinical Social Worker

## 2021-11-26 NOTE — Patient Outreach (Signed)
  Care Coordination   Initial Visit Note   11/26/2021 Name: [name] MRN: [id number] DOB: May 23, 1945  [name] is a 76 y.o. year old female who sees Lawerance Cruel, MD for primary care. I spoke with  [name] by phone today.  What matters to the patients health and wellness today?  Managing her Chronic health  Patient continues to experience symptoms of depression which seems to be exacerbated by stress at home and chronic health conditions..   Recommendation: Patient may benefit from, and is in agreement to Continue seeing her mental health providers Talk with RN Care Coordinator.    Goals Addressed             This Visit's Progress    COMPLETED: Care Coordination Activities       Care Coordination Interventions: Provided education to patient re: Care Coordination Services Assessed social determinant of health barriers Discussed concerns with chronic health Solution-Focused Strategies employed:  Active listening / Reflection utilized  Emotional Support Provided Problem [place] strategies reviewed Suicidal Ideation/Homicidal Ideation assessed: denies Made referral to RN Care Coordinator   Discussed coping skills, Current mental health providers [name] seeing over 1 year Therapist [name] seeing 25 years        SDOH assessments and interventions completed:  Yes  SDOH Interventions Today    Flowsheet Row Most Recent Value  SDOH Interventions   Food Insecurity Interventions Intervention Not Indicated  Housing Interventions Intervention Not Indicated  Transportation Interventions Intervention Not Indicated  Utilities Interventions Intervention Not Indicated  Physical Activity Interventions [employer]  Stress Interventions Provide Counseling, Other (Comment)       Care Coordination Interventions Activated:  Yes  Care Coordination Interventions:  Yes, provided   Follow up plan:  No further intervention  required. Referral made to RN Care Coordinator    Encounter Outcome:  Pt. Visit Completed   [name], [place] [phone]

## 2021-11-26 NOTE — Patient Instructions (Signed)
Visit Information  Thank you for taking time to visit with me today. Please don't hesitate to contact me if I can be of assistance to you.   Following are the goals we discussed today:   Goals Addressed             This Visit's Progress    COMPLETED: Care Coordination Activities       Care Coordination Interventions: Provided education to patient re: Care Coordination Services Assessed social determinant of health barriers Discussed concerns with chronic health Solution-Focused Strategies employed:  Active listening / Reflection utilized  Emotional Support Provided Problem [place] strategies reviewed Suicidal Ideation/Homicidal Ideation assessed: denies Made referral to RN Care Coordinator   Discussed coping skills, Current mental health providers [facility] Dr. Pablo Lawrence seeing over 1 year Therapist Georgia Dom seeing 25 years        your next appointment is by telephone on 9/12 at 10:00 with the Nurse care coordinator  Please call the care guide team at [phone] if you need to cancel or reschedule your appointment.   If you are experiencing a Mental Health or [place] or need someone to talk to, please call the Suicide and Crisis Lifeline: 988 call the [place] National Suicide Prevention Lifeline: 639-140-0220 or TTY: 409-369-0891 TTY 413-247-0974) to talk to a trained counselor call 1-800-273-TALK (toll free, 24 hour hotline) go to [facility] [address] [place] [phone])   Patient verbalizes understanding of instructions and care plan provided today and agrees to view in [place]. Active MyChart status and patient understanding of how to access instructions and care plan via MyChart confirmed with patient.     Casimer Lanius, [place] [phone]

## 2021-12-01 ENCOUNTER — Ambulatory Visit: Payer: Self-pay

## 2021-12-01 NOTE — Patient Outreach (Signed)
  Care Coordination   Initial Visit Note   12/01/2021 Name: [name] MRN: [id number] DOB: 08-09-1945  [name] is a 76 y.o. year old female who sees Lawerance Cruel, MD for primary care. I spoke with  [name] by phone today.  What matters to the patients health and wellness today?  Health Maintenance and Fall Risk    Goals Addressed             This Visit's Progress    Fall Risk       Care Coordination Interventions: Assessed for recent falls. Reports an accidental fall last month d/t tripping over an object on the floor. Denies fall related injuries. Provided information regarding safety and fall prevention. Advised to continue using cane as needed.  Discussed pending ortho follow-up. Advised to complete MRI of right lower extremity as scheduled.  Reports independence with ADLs and IADLs. Reports currently having support in the home. Agreed to contact the care management team if this changes, and assistance is needed.        SDOH assessments and interventions completed:  Yes  SDOH Interventions Today    Flowsheet Row Most Recent Value  SDOH Interventions   Food Insecurity Interventions Intervention Not Indicated  Transportation Interventions Intervention Not Indicated        Care Coordination Interventions Activated:  Yes  Care Coordination Interventions:  Yes, provided   Follow up plan: Follow up call scheduled for December 16, 2021.    Encounter Outcome:  Pt. Visit Completed   [place] Management [phone]

## 2021-12-01 NOTE — Patient Instructions (Signed)
Visit Information  Thank you for allowing the Care Management team to participate in your care. It was great speaking with you today!  Following are the goals we discussed today:   Goals Addressed             This Visit's Progress    Fall Risk       Care Coordination Interventions: Assessed for recent falls. Reports an accidental fall last month d/t tripping over an object on the floor. Denies fall related injuries. Provided information regarding safety and fall prevention. Advised to continue using cane as needed.  Discussed pending ortho follow-up. Advised to complete MRI of right lower extremity as scheduled.  Reports independence with ADLs and IADLs. Reports currently having support in the home. Agreed to contact the care management team if this changes, and assistance is needed.        Our next appointment is by telephone on December 16, 2021 at 3 pm. Please call the care guide team at [phone] if you need to cancel or reschedule your appointment.   If you are experiencing a Mental Health or [place] or need someone to talk to, please call: -Suicide and Crisis Lifeline: 988 -[place] National Suicide Prevention Lifeline: 8202361702 or TTY: 8312635816 TTY (220)797-9932) to talk to a trained counselor -call 1-800-273-TALK (toll free, 24 hour hotline) -go to [facility] [address] [place] [phone]) -call 911   Ms. Hedges verbalized understanding of the information discussed during the telephonic outreach. Declined need for mailed instructions or resources.   A member of the care management team will follow up next month.   [place] Management [phone]

## 2021-12-04 DIAGNOSIS — R002 Palpitations: Secondary | ICD-10-CM | POA: Diagnosis not present

## 2021-12-04 DIAGNOSIS — L298 Other pruritus: Secondary | ICD-10-CM | POA: Diagnosis not present

## 2021-12-14 DIAGNOSIS — R7303 Prediabetes: Secondary | ICD-10-CM | POA: Diagnosis not present

## 2021-12-14 DIAGNOSIS — I2510 Atherosclerotic heart disease of native coronary artery without angina pectoris: Secondary | ICD-10-CM | POA: Diagnosis not present

## 2021-12-14 DIAGNOSIS — E039 Hypothyroidism, unspecified: Secondary | ICD-10-CM | POA: Diagnosis not present

## 2021-12-16 ENCOUNTER — Ambulatory Visit: Payer: Self-pay

## 2021-12-16 NOTE — Patient Outreach (Signed)
  Care Coordination   12/16/2021 Name: ARNETA MAHMOOD MRN: [id number] DOB: 1945-09-23   Care Coordination Outreach Attempts:  An unsuccessful telephone outreach was attempted for a scheduled appointment today.  Follow Up Plan:  Additional outreach attempts will be made to offer the patient care coordination information and services.   Encounter Outcome:  No Answer  Care Coordination Interventions Activated:  No   Care Coordination Interventions:  No, not indicated    [place] Management [phone]

## 2021-12-17 ENCOUNTER — Other Ambulatory Visit (HOSPITAL_BASED_OUTPATIENT_CLINIC_OR_DEPARTMENT_OTHER): Payer: Medicare Other

## 2021-12-24 ENCOUNTER — Other Ambulatory Visit: Payer: Medicare Other

## 2021-12-25 DIAGNOSIS — M797 Fibromyalgia: Secondary | ICD-10-CM | POA: Diagnosis not present

## 2021-12-29 ENCOUNTER — Inpatient Hospital Stay: Payer: Medicare Other | Attending: Oncology | Admitting: Oncology

## 2021-12-29 ENCOUNTER — Other Ambulatory Visit: Payer: Self-pay

## 2021-12-29 VITALS — BP 109/78 | HR 78 | Temp 97.8°F | Resp 18 | Ht 63.0 in | Wt 238.4 lb

## 2021-12-29 DIAGNOSIS — Z85528 Personal history of other malignant neoplasm of kidney: Secondary | ICD-10-CM | POA: Diagnosis not present

## 2021-12-29 DIAGNOSIS — Z905 Acquired absence of kidney: Secondary | ICD-10-CM | POA: Insufficient documentation

## 2021-12-29 DIAGNOSIS — K469 Unspecified abdominal hernia without obstruction or gangrene: Secondary | ICD-10-CM | POA: Diagnosis not present

## 2021-12-29 DIAGNOSIS — C641 Malignant neoplasm of right kidney, except renal pelvis: Secondary | ICD-10-CM

## 2021-12-29 DIAGNOSIS — Z79899 Other long term (current) drug therapy: Secondary | ICD-10-CM | POA: Diagnosis not present

## 2021-12-29 NOTE — Progress Notes (Signed)
Hematology and Oncology Follow Up Visit  PAMLEA FINDER [id number] 1946-03-13 76 y.o. 12/29/2021 8:49 AM  CC: Antony Haste ([name]) Harrington Challenger, M.D.    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   Prior therapy: She is status post right radical nephrectomy completed by Dr. Claudia Desanctis on Jul 31, 2019.  Final pathology showed a 6 cm clear-cell renal cell carcinoma.  Current therapy: Active surveillance.  Interim History:  Ms. Decoste returns today for a follow-up visit.  Since the last visit, she continues to have issues with abdominal hernia and currently in evaluation for surgery.  She denies any nausea, vomiting or abdominal pain.  She had hematuria or dysuria.  She denies any hospitalizations or illnesses.     Medications: Updated on review. Current Outpatient Medications  Medication Sig Dispense Refill   acetaminophen (TYLENOL) 500 MG tablet Take 500-1,000 mg by mouth every 6 (six) hours as needed for mild pain (or headaches).     aspirin 81 MG EC tablet 1 tablet     b complex vitamins tablet Take 1 tablet by mouth daily.     Cholecalciferol (VITAMIN D3) 125 MCG (5000 UT) CAPS Take 5,000 Units by mouth in the morning.     diazepam (VALIUM) 2 MG tablet Take 2 mg by mouth daily.     diphenhydrAMINE (BENADRYL) 25 mg capsule Take 25 mg by mouth every 6 (six) hours as needed for allergies.     DULoxetine HCl 40 MG CPEP Take 1 capsule by mouth every morning.     FLUoxetine (PROZAC) 20 MG capsule Take 20 mg by mouth daily. (Patient not taking: Reported on 09/25/2021)     gatifloxacin (ZYMAXID) 0.5 % SOLN Place 1 drop into the right eye 4 (four) times daily. (Patient not taking: Reported on 09/25/2021)     ipratropium (ATROVENT) 0.06 % nasal spray Place 2 sprays into both nostrils in the morning.     levothyroxine (SYNTHROID) 88 MCG tablet Take 88 mcg by mouth every morning.     LORazepam (ATIVAN) 0.5 MG tablet Take 1 tablet (0.5 mg  total) by mouth daily as needed for anxiety. (Patient not taking: Reported on 10/12/2021) 30 tablet 0   losartan-hydrochlorothiazide (HYZAAR) 50-12.5 MG tablet TAKE 1 TABLET BY MOUTH TWICE A DAY 90 tablet 0   methylphenidate (RITALIN) 5 MG tablet Take 5 mg by mouth 2 (two) times daily.     metoprolol tartrate (LOPRESSOR) 25 MG tablet Take 25 mg by mouth 2 (two) times daily.     MINERAL ICE 2 % GEL Apply 1 application topically at bedtime as needed (to painful sites).     Omega 3 1000 MG CAPS Take 1,000 mg by mouth daily.     ondansetron (ZOFRAN-ODT) 8 MG disintegrating tablet Take 8 mg by mouth every 8 (eight) hours as needed for nausea or vomiting (dissolve orally).     polyethylene glycol powder (GLYCOLAX/MIRALAX) 17 GM/SCOOP powder Take 17 g by mouth See admin instructions. Mix 17 grams of powder into a fruit smoothie and drink every morning (Patient not taking: Reported on 04/30/2021)     prednisoLONE acetate (PRED FORTE) 1 % ophthalmic suspension SMARTSIG:In Eye(s) (Patient not taking: Reported on 09/25/2021)     predniSONE (DELTASONE) 20 MG tablet Take by mouth.     promethazine (PHENERGAN) 25 MG tablet SMARTSIG:1 Tablet(s) By Mouth Every 12 Hours PRN     rOPINIRole (REQUIP) 0.5 MG tablet TAKE 1 TABLET BY MOUTH EVERY DAY AFTER  SUPPER AND TAKE 2 TABLETS AT BEDTIME 90 tablet 10   rosuvastatin (CRESTOR) 5 MG tablet Take 1 tablet (5 mg total) by mouth daily. 90 tablet 3   SALONPAS 3.03-27-08 % PTCH Apply 1 patch topically See admin instructions. Apply 1 patch to affected sites one to two times a day as needed for pain     traMADol (ULTRAM) 50 MG tablet SMARTSIG:0.5-1 Tablet(s) By Mouth 2-3 Times Daily PRN (Patient not taking: Reported on 10/12/2021)     vitamin C (ASCORBIC ACID) 500 MG tablet Take 500 mg by mouth daily.     No current facility-administered medications for this visit.    Allergies:  Allergies  Allergen Reactions   Geodon [Ziprasidone Hydrochloride] Other (See Comments)    Extremely  agitated   Lithium Nausea Only and Other (See Comments)    Off balance, increased heart rate   Talwin [Pentazocine] Other (See Comments)    Hallucinations    Toradol [Ketorolac Tromethamine] Other (See Comments)    Chest pains   Ziprasidone Other (See Comments)    Extreme agitation   Abilify [Aripiprazole] Other (See Comments)    jerking   Compazine [Prochlorperazine Edisylate] Nausea And Vomiting   Latuda [Lurasidone Hcl] Other (See Comments)    Reports made her mind race more and made her irritable    Pristiq [Desvenlafaxine Succinate Er] Other (See Comments)    Did not work, prefers not to take   Rexulti [Brexpiprazole] Other (See Comments) and Hypertension    Elevated BP, created aggression   Diflucan [Fluconazole] Nausea And Vomiting     Physical Exam:  Blood pressure 109/78, pulse 78, temperature 97.8 F (36.6 C), temperature source Temporal, resp. rate 18, height '5\' 3"'$  (1.6 m), weight 238 lb 6.4 oz (108.1 kg), SpO2 100 %.      ECOG: 1   General appearance: Alert, awake without any distress. Head: Atraumatic without abnormalities Oropharynx: Without any thrush or ulcers. Eyes: No scleral icterus. Lymph nodes: No lymphadenopathy noted in the cervical, supraclavicular, or axillary nodes Heart:regular rate and rhythm, without any murmurs or gallops.   Lung: Clear to auscultation without any rhonchi, wheezes or dullness to percussion. Abdomin: Soft, nontender without any shifting dullness or ascites. Musculoskeletal: No clubbing or cyanosis. Neurological: No motor or sensory deficits. Skin: No rashes or lesions.      Lab Results: Lab Results  Component Value Date   WBC 5.0 10/20/2021   HGB 12.5 10/20/2021   HCT 38.3 10/20/2021   MCV 84 10/20/2021   PLT 228 10/20/2021     Chemistry      Component Value Date/Time   NA 139 10/20/2021 1613   NA 139 02/18/2015 1440   K 3.9 10/20/2021 1613   K 4.2 02/18/2015 1440   CL 102 10/20/2021 1613   CL 103  07/21/2012 1415   CO2 26 10/20/2021 1613   CO2 29 02/18/2015 1440   BUN 14 10/20/2021 1613   BUN 19.1 02/18/2015 1440   CREATININE 1.01 (H) 10/20/2021 1613   CREATININE 0.9 02/18/2015 1440      Component Value Date/Time   CALCIUM 9.4 10/20/2021 1613   CALCIUM 10.2 02/18/2015 1440   ALKPHOS 75 10/20/2021 1613   ALKPHOS 89 02/18/2015 1440   AST 19 10/20/2021 1613   AST 20 02/18/2015 1440   ALT 14 10/20/2021 1613   ALT 16 02/18/2015 1440   BILITOT 0.4 10/20/2021 1613   BILITOT 0.37 02/18/2015 1440           1.  T3a clear-cell renal cell carcinoma diagnosed in May 2021 after radical nephrectomy.    She is currently on active surveillance without any evidence of relapse.  I recommended updating her staging scan since its been now close to a year since her recent imaging studies.  Salvage therapy options including immunotherapy and oral targeted therapy were discussed and these will be reserved if she has relapsed disease.  If she does not have any evidence of metastasis then surveillance with annual scans would be recommended.    2.  Abdominal hernia: She is currently under evaluation from general surgery.   3.  Follow-up: In 6 months for repeat follow-up.   30  minutes were spent on this visit.  The time was dedicated to reviewing her disease status, treatment choices and outlining future plan of care discussion.    Roxy Cedar Breezy Hertenstein 10/10/20238:49 AM

## 2022-01-07 ENCOUNTER — Other Ambulatory Visit (HOSPITAL_BASED_OUTPATIENT_CLINIC_OR_DEPARTMENT_OTHER): Payer: Medicare Other

## 2022-01-07 DIAGNOSIS — M545 Low back pain, unspecified: Secondary | ICD-10-CM | POA: Diagnosis not present

## 2022-01-08 DIAGNOSIS — T148XXA Other injury of unspecified body region, initial encounter: Secondary | ICD-10-CM | POA: Diagnosis not present

## 2022-01-08 DIAGNOSIS — M545 Low back pain, unspecified: Secondary | ICD-10-CM | POA: Diagnosis not present

## 2022-01-12 ENCOUNTER — Telehealth: Payer: Self-pay | Admitting: *Deleted

## 2022-01-12 NOTE — Telephone Encounter (Signed)
Attempted to contact patient to inform her she may schedule her CT scan by calling Central Scheduling, no answer, unable to leave message because voice mailbox is full.  Third attempt to contact patient.

## 2022-01-13 ENCOUNTER — Ambulatory Visit: Payer: Medicare Other | Attending: Student | Admitting: Nurse Practitioner

## 2022-01-13 ENCOUNTER — Encounter (HOSPITAL_COMMUNITY): Payer: Self-pay

## 2022-01-13 ENCOUNTER — Ambulatory Visit: Payer: Medicare Other | Admitting: Nurse Practitioner

## 2022-01-13 ENCOUNTER — Emergency Department (HOSPITAL_COMMUNITY)
Admission: EM | Admit: 2022-01-13 | Discharge: 2022-01-13 | Disposition: A | Discharge status: Home / Self Care | Payer: Medicare Other | Attending: Emergency Medicine | Admitting: Emergency Medicine

## 2022-01-13 ENCOUNTER — Encounter: Payer: Self-pay | Admitting: Student

## 2022-01-13 VITALS — BP 114/58 | HR 79 | Wt 232.0 lb

## 2022-01-13 DIAGNOSIS — I10 Essential (primary) hypertension: Secondary | ICD-10-CM | POA: Insufficient documentation

## 2022-01-13 DIAGNOSIS — F322 Major depressive disorder, single episode, severe without psychotic features: Secondary | ICD-10-CM

## 2022-01-13 DIAGNOSIS — Z79899 Other long term (current) drug therapy: Secondary | ICD-10-CM | POA: Diagnosis not present

## 2022-01-13 DIAGNOSIS — F419 Anxiety disorder, unspecified: Secondary | ICD-10-CM

## 2022-01-13 DIAGNOSIS — Z046 Encounter for general psychiatric examination, requested by authority: Secondary | ICD-10-CM | POA: Insufficient documentation

## 2022-01-13 DIAGNOSIS — F329 Major depressive disorder, single episode, unspecified: Secondary | ICD-10-CM | POA: Diagnosis present

## 2022-01-13 DIAGNOSIS — I2510 Atherosclerotic heart disease of native coronary artery without angina pectoris: Secondary | ICD-10-CM | POA: Insufficient documentation

## 2022-01-13 DIAGNOSIS — Z7982 Long term (current) use of aspirin: Secondary | ICD-10-CM | POA: Insufficient documentation

## 2022-01-13 DIAGNOSIS — R45851 Suicidal ideations: Secondary | ICD-10-CM | POA: Diagnosis not present

## 2022-01-13 LAB — CBC WITH DIFFERENTIAL/PLATELET
Abs Immature Granulocytes: 0.02 10*3/uL (ref 0.00–0.07)
Basophils Absolute: 0 10*3/uL (ref 0.0–0.1)
Basophils Relative: 0 %
Eosinophils Absolute: 0.2 10*3/uL (ref 0.0–0.5)
Eosinophils Relative: 2 %
HCT: 49 % — ABNORMAL HIGH (ref 36.0–46.0)
Hemoglobin: 15.6 g/dL — ABNORMAL HIGH (ref 12.0–15.0)
Immature Granulocytes: 0 %
Lymphocytes Relative: 25 %
Lymphs Abs: 1.9 10*3/uL (ref 0.7–4.0)
MCH: 27.9 pg (ref 26.0–34.0)
MCHC: 31.8 g/dL (ref 30.0–36.0)
MCV: 87.5 fL (ref 80.0–100.0)
Monocytes Absolute: 0.5 10*3/uL (ref 0.1–1.0)
Monocytes Relative: 6 %
Neutro Abs: 5 10*3/uL (ref 1.7–7.7)
Neutrophils Relative %: 67 %
Platelets: 243 10*3/uL (ref 150–400)
RBC: 5.6 MIL/uL — ABNORMAL HIGH (ref 3.87–5.11)
RDW: 15.1 % (ref 11.5–15.5)
WBC: 7.6 10*3/uL (ref 4.0–10.5)
nRBC: 0 % (ref 0.0–0.2)

## 2022-01-13 LAB — COMPREHENSIVE METABOLIC PANEL
ALT: 24 U/L (ref 0–44)
AST: 22 U/L (ref 15–41)
Albumin: 4.4 g/dL (ref 3.5–5.0)
Alkaline Phosphatase: 69 U/L (ref 38–126)
Anion gap: 13 (ref 5–15)
BUN: 21 mg/dL (ref 8–23)
CO2: 25 mmol/L (ref 22–32)
Calcium: 9.8 mg/dL (ref 8.9–10.3)
Chloride: 102 mmol/L (ref 98–111)
Creatinine, Ser: 1.27 mg/dL — ABNORMAL HIGH (ref 0.44–1.00)
GFR, Estimated: 44 mL/min — ABNORMAL LOW (ref >60)
Glucose, Bld: 85 mg/dL (ref 70–99)
Potassium: 3.6 mmol/L (ref 3.5–5.1)
Sodium: 140 mmol/L (ref 135–145)
Total Bilirubin: 0.9 mg/dL (ref 0.3–1.2)
Total Protein: 7.8 g/dL (ref 6.5–8.1)

## 2022-01-13 LAB — RAPID URINE DRUG SCREEN, HOSP PERFORMED
Amphetamines: NOT DETECTED
Barbiturates: NOT DETECTED
Benzodiazepines: POSITIVE — AB
Cocaine: NOT DETECTED
Opiates: POSITIVE — AB
Tetrahydrocannabinol: POSITIVE — AB

## 2022-01-13 LAB — ETHANOL: Alcohol, Ethyl (B): <10 mg/dL (ref <10)

## 2022-01-13 MED ORDER — ROPINIROLE HCL 1 MG PO TABS
0.5000 mg | ORAL_TABLET | Freq: Every day | ORAL | Status: DC
  Administered 2022-01-13: 0.5 mg via ORAL
  Filled 2022-01-13: qty 1

## 2022-01-13 MED ORDER — ACETAMINOPHEN 500 MG PO TABS: 500.0000 mg | ORAL_TABLET | Freq: Four times a day (QID) | ORAL | Status: DC | PRN

## 2022-01-13 MED ORDER — LOSARTAN POTASSIUM 25 MG PO TABS: 50.0000 mg | ORAL_TABLET | Freq: Every day | ORAL | Status: DC

## 2022-01-13 MED ORDER — HYDROCHLOROTHIAZIDE 12.5 MG PO TABS: 12.5000 mg | ORAL_TABLET | Freq: Every day | ORAL | Status: DC

## 2022-01-13 MED ORDER — VITAMIN D 25 MCG (1000 UNIT) PO TABS: 125.0000 ug | ORAL_TABLET | Freq: Every day | ORAL | Status: DC

## 2022-01-13 MED ORDER — LORAZEPAM 0.5 MG PO TABS: 0.5000 mg | ORAL_TABLET | ORAL | Status: DC | PRN

## 2022-01-13 MED ORDER — METOPROLOL TARTRATE 25 MG PO TABS: 25.0000 mg | ORAL_TABLET | Freq: Once | ORAL | Status: DC

## 2022-01-13 MED ORDER — FLUOXETINE HCL 20 MG PO CAPS: 30.0000 mg | ORAL_CAPSULE | Freq: Every day | ORAL | Status: DC

## 2022-01-13 MED ORDER — ASPIRIN 81 MG PO TBEC: 81.0000 mg | DELAYED_RELEASE_TABLET | Freq: Every day | ORAL | Status: DC

## 2022-01-13 MED ORDER — ROSUVASTATIN CALCIUM 5 MG PO TABS: 5.0000 mg | ORAL_TABLET | Freq: Every day | ORAL | Status: DC

## 2022-01-13 MED ORDER — LEVOTHYROXINE SODIUM 88 MCG PO TABS: 88.0000 ug | ORAL_TABLET | Freq: Every day | ORAL | Status: DC
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
## 2022-01-13 NOTE — ED Notes (Signed)
Pt to desk requesting to leave and wants to follow-up with her psychiatrist tomorrow, EDP aware

## 2022-01-13 NOTE — Discharge Instructions (Addendum)
Please call the number attached for the behavioral health hospital as they will be able to see and assess you.  Attached resources for you to reach out to.  Recommend follow-up with your therapist tomorrow for reevaluation of your symptoms.  Please do not hesitate to return to emergency department if the worrisome signs and symptoms we discussed become apparent.

## 2022-01-13 NOTE — ED Triage Notes (Signed)
Pt c/o SI with no active plan. States chronic issue she has been battling.

## 2022-01-13 NOTE — ED Provider Notes (Signed)
[facility] DEPT Provider Note   CSN: [id number] Arrival date & time: 01/13/22  1810     History  Chief Complaint  Patient presents with   Psychiatric Evaluation    [name] is a 76 y.o. female.  HPI   76 year old female presents emergency department with complaints of suicidal ideation/depression.  Patient states she has a longstanding history of depression since she was in college.  She reports increasing severity over the past few weeks.  She has thoughts of wishing she were dead but no clear plan.  Denies homicidal ideation/auditory visual hallucinations.  She states she has been compliant with her home medications with no recent changes.  Denies fever, chills, night sweats, chest pain, shortness of breath, abdominal pain, nausea, vomiting, urinary symptoms, change in bowel habits.  Past medical history significant for generalized anxiety disorder, ADHD, PTSD, restless leg syndrome, hypertension, CAD  Home Medications Prior to Admission medications   Medication Sig Start Date End Date Taking? Authorizing Provider  acetaminophen (TYLENOL) 500 MG tablet Take 500-1,000 mg by mouth every 6 (six) hours as needed for mild pain (or headaches).    [provider]  aspirin 81 MG EC tablet 1 tablet 12/30/20   [provider]  b complex vitamins tablet Take 1 tablet by mouth daily.    [provider]  Cholecalciferol (VITAMIN D3) 125 MCG (5000 UT) CAPS Take 5,000 Units by mouth in the morning.    [provider]  diazepam (VALIUM) 2 MG tablet Take 2 mg by mouth daily.    [provider]  diphenhydrAMINE (BENADRYL) 25 mg capsule Take 25 mg by mouth every 6 (six) hours as needed for allergies.    [provider]  DULoxetine (CYMBALTA) 20 MG capsule Take by mouth. 08/29/21   [provider]  DULoxetine HCl 40 MG CPEP Take 1 capsule by mouth every morning. 05/21/21   [provider]   FLUoxetine (PROZAC) 20 MG capsule Take 20 mg by mouth daily. 04/16/21   [provider]  gatifloxacin (ZYMAXID) 0.5 % SOLN Place 1 drop into the right eye 4 (four) times daily. 12/09/20   [provider]  HYDROcodone-acetaminophen (NORCO/VICODIN) 5-325 MG tablet Take 1 tablet by mouth every 8 (eight) hours as needed. 01/08/22   [provider]  ipratropium (ATROVENT) 0.06 % nasal spray Place 2 sprays into both nostrils in the morning.    [provider]  levothyroxine (SYNTHROID) 88 MCG tablet Take 88 mcg by mouth every morning. 03/14/21   [provider]  LORazepam (ATIVAN) 0.5 MG tablet Take 1 tablet (0.5 mg total) by mouth daily as needed for anxiety. 10/13/20   Derrill Center, NP  losartan-hydrochlorothiazide (HYZAAR) 50-12.5 MG tablet TAKE 1 TABLET BY MOUTH TWICE A DAY 10/08/21   Buford Dresser, MD  methylphenidate (RITALIN) 5 MG tablet Take 5 mg by mouth 2 (two) times daily. 01/13/21   [provider]  metoprolol tartrate (LOPRESSOR) 25 MG tablet Take 25 mg by mouth 2 (two) times daily. 10/15/20   [provider]  MINERAL ICE 2 % GEL Apply 1 application topically at bedtime as needed (to painful sites).    [provider]  Omega 3 1000 MG CAPS Take 1,000 mg by mouth daily.    [provider]  ondansetron (ZOFRAN-ODT) 8 MG disintegrating tablet Take 8 mg by mouth every 8 (eight) hours as needed for nausea or vomiting (dissolve orally).    [provider]  polyethylene  glycol powder (GLYCOLAX/MIRALAX) 17 GM/SCOOP powder Take 17 g by mouth See admin instructions. Mix 17 grams of powder into a fruit smoothie and drink every morning    [provider]  prednisoLONE acetate (PRED FORTE) 1 % ophthalmic suspension  03/10/21   [provider]  predniSONE (DELTASONE) 20 MG tablet Take by mouth. 04/21/21   [provider]  promethazine (PHENERGAN) 25 MG tablet SMARTSIG:1 Tablet(s) By  Mouth Every 12 Hours PRN 02/05/21   [provider]  rOPINIRole (REQUIP) 0.5 MG tablet TAKE 1 TABLET BY MOUTH EVERY DAY AFTER SUPPER AND TAKE 2 TABLETS AT BEDTIME 04/02/21   Rigoberto Noel, MD  rosuvastatin (CRESTOR) 5 MG tablet Take 1 tablet (5 mg total) by mouth daily. 11/09/19 01/15/21  Buford Dresser, MD  SALONPAS 3.03-27-08 % PTCH Apply 1 patch topically See admin instructions. Apply 1 patch to affected sites one to two times a day as needed for pain    [provider]  traMADol (ULTRAM) 50 MG tablet  02/19/21   [provider]  vitamin C (ASCORBIC ACID) 500 MG tablet Take 500 mg by mouth daily.    [provider]      Allergies    Geodon [ziprasidone hydrochloride], Lithium, Talwin [pentazocine], Toradol [ketorolac tromethamine], Ziprasidone, Abilify [aripiprazole], Compazine [prochlorperazine edisylate], Latuda [lurasidone hcl], Pristiq [desvenlafaxine succinate er], Rexulti [brexpiprazole], and Diflucan [fluconazole]    Review of Systems   Review of Systems  All other systems reviewed and are negative.   Physical Exam Updated Vital Signs BP 138/71 (BP Location: Left Arm)   Pulse 70   Temp 97.6 F (36.4 C) (Oral)   Resp 17   SpO2 100%  Physical Exam Vitals and nursing note reviewed.  Constitutional:      General: She is not in acute distress.    Appearance: She is well-developed.  HENT:     Head: Normocephalic and atraumatic.  Eyes:     Conjunctiva/sclera: Conjunctivae normal.  Cardiovascular:     Rate and Rhythm: Normal rate and regular rhythm.     Heart sounds: No murmur heard. Pulmonary:     Effort: Pulmonary effort is normal. No respiratory distress.     Breath sounds: Normal breath sounds.  Abdominal:     Palpations: Abdomen is soft.     Tenderness: There is no abdominal tenderness.  Musculoskeletal:        General: No swelling.     Cervical back: Neck supple.  Skin:    General: Skin is warm and dry.     Capillary  Refill: Capillary refill takes less than 2 seconds.  Neurological:     Mental Status: She is alert.  Psychiatric:        Mood and Affect: Mood normal.     ED Results / Procedures / Treatments   Labs (all labs ordered are listed, but only abnormal results are displayed) Labs Reviewed  CBC WITH DIFFERENTIAL/PLATELET - Abnormal; Notable for the following components:      Result Value   RBC 5.60 (*)    Hemoglobin 15.6 (*)    HCT 49.0 (*)    All other components within normal limits  COMPREHENSIVE METABOLIC PANEL  ETHANOL  RAPID URINE DRUG SCREEN, HOSP PERFORMED    EKG None  Radiology No results found.  Procedures Procedures    Medications Ordered in ED Medications  rOPINIRole (REQUIP) tablet 0.5 mg (0.5 mg Oral Given 01/13/22 2047)  rosuvastatin (CRESTOR) tablet 5 mg (has no administration in time range)  metoprolol tartrate (LOPRESSOR) tablet 25 mg (has no administration in time range)  hydrochlorothiazide (HYDRODIURIL) tablet 12.5 mg (has no administration in time range)  losartan (COZAAR) tablet 50 mg (has no administration in time range)  LORazepam (ATIVAN) tablet 0.5 mg (has no administration in time range)  levothyroxine (SYNTHROID) tablet 88 mcg (has no administration in time range)  FLUoxetine (PROZAC) capsule 30 mg (has no administration in time range)  aspirin EC tablet 81 mg (has no administration in time range)  acetaminophen (TYLENOL) tablet 500 mg (has no administration in time range)  cholecalciferol (VITAMIN D3) 25 MCG (1000 UNIT) tablet 125 mcg (has no administration in time range)    ED Course/ Medical Decision Making/ A&P                           Medical Decision Making Amount and/or Complexity of Data Reviewed Labs: ordered.  Risk OTC drugs. Prescription drug management.   This patient presents to the ED for concern of suicidal ideation, this involves an extensive number of treatment options, and is a complaint that carries with it a high  risk of complications and morbidity.  The differential diagnosis includes suicidal ideation, drug withdrawal/intoxication, homicidal ideation, auditory visual elucidation,   Co morbidities that complicate the patient evaluation  See HPI   Additional history obtained:  Additional history obtained from EMR External records from outside source obtained and reviewed including hospital records   Lab Tests:  I Ordered, and personally interpreted labs.  The pertinent results include: No leukocytosis noted.  No evidence anemia.  Platelets within normal range.  Ethanol, CMP and UDS pending.   Imaging Studies ordered:  N/a   Cardiac Monitoring: / EKG:  The patient was maintained on a cardiac monitor.  I personally viewed and interpreted the cardiac monitored which showed an underlying rhythm of: Sinus rhythm   Consultations Obtained:  TTS consultation was made upon medical clearance of patient.  Problem List / ED Course / Critical interventions / Medication management  Suicidal thoughts I ordered medication including home meds for continuation of at home med therapy   Reevaluation of the patient after these medicines showed that the patient improved I have reviewed the patients home medicines and have made adjustments as needed   Social Determinants of Health:  Denies tobacco, illicit drug use   Test / Admission - Considered:  Suicidal thoughts Vitals signs within normal range and stable throughout visit. Laboratory/imaging studies significant for: See above Patient came here voluntarily for concerns of suicidal ideation.  She has no clear plan.  She has not expressed intent to kill herself but just some intrusive thoughts occasionally.  She denies homicidal ideation or auditory visual hallucination.  She states symptoms have been chronic in nature but per cardiologist encouraged her to seek further treatment today.  Upon waiting for TTS consultation, patient requested to  leave.  Patient deemed no direct threat to self given lack of intent and isolated occasional intrusive thought of suicidal ideation.  Patient provided with behavioral health outpatient information for follow-up.  Treatment plan discussed at length with patient she knowledge understanding was agreeable to said plan. Worrisome signs and symptoms were discussed with the patient, and the patient acknowledged understanding to return to the ED if noticed. Patient was stable upon discharge. '        Final Clinical Impression(s) / ED Diagnoses Final diagnoses:  Suicidal ideation    Rx / DC Orders ED Discharge Orders  None         Wilnette Kales, [place] 01/13/22 2103    Lennice Sites, DO 01/13/22 2116

## 2022-01-13 NOTE — Progress Notes (Signed)
H&V Care Navigation CSW Progress Note  Clinical Social Worker  was contacted by provider Benjamine Mola, NP,  to f/u on next steps for pt. Pt shared with provider that she is having thoughts of self harm/suicidality. She is agreeable for further evaluation- provider shares that at this time she doesn't have a ride to the emergency department, prefers to go via EMS. I shared that she should contact [facility] and see if we can request a nonemergency medical transport to hospital for pt safety and to provide pt some privacy in transfer.   LCSW able to speak with pt directly. I introduced self, role, reason for visit. Pt lives with a significant other who she met at a depression support group- pt shares that he does not take care of himself and they do not have a healthy relationship. She has been looking for an apartment and unfortunately this has been complicated. She receives Fish farm manager, a small pension and her ex husbands Fish farm manager.   She shares that she has been seeing a psychiatrist as well as a therapist. Feels very supported by her therapist but has had a hard time connecting with psychiatrist/finding a regimen that she feels is helping her depression which she has had since being a young adult. Pt expressed ongoing feelings and thoughts of wanting to "not be here anymore." She is interested in further evaluation, has been to [facility] and in [place] in the past for treatment as well as the [facility] for walk in. Pt attempted to reach her partner to let him know plan, no answer and she elected to not leave a voicemail message.   LCSW provided active listening, and thanked pt for coming in today and being open enough to share her concerns. Provided verbal support for pt persistence to continue therapy, psychiatric support and treatment options. Encouraged her to continue those as well as to find groups that may allow her to pursue some of her passions in the volunteer and  creative arts space.    Nonemergency EMS has been contacted and clinic staff remain with pt at this time.   Remain available for pt support/provider support.   Patient is participating in a Managed Medicaid Plan:  No, UHC Medicare and Medicaid of [place]: No Food Insecurity (12/01/2021)  Housing: Low Risk  (11/26/2021)  Transportation Needs: No Transportation Needs (12/01/2021)  Utilities: Not At Risk (11/26/2021)  Alcohol Screen: Low Risk  (05/25/2018)  Depression (PHQ2-9): Medium Risk (10/14/2020)  Financial Resource Strain: Low Risk  (11/26/2021)  Physical Activity: Inactive (11/26/2021)  Stress: Stress Concern Present (11/26/2021)  Tobacco Use: Low Risk  (01/13/2022)   Westley Hummer, MSW, LCSW Clinical Social Worker [place]  [phone]- work cell phone (preferred) [phone]- desk phone

## 2022-01-13 NOTE — Progress Notes (Addendum)
Cardiology Office Note:    Date:  01/13/2022   ID:  [name], DOB May 09, 1945, MRN [id number]  PCP:  Lawerance Cruel, [place] [facility] Cardiologist:  Buford Dresser, MD     Referring MD: Lawerance Cruel, MD   CC: Recent elevated BP readings   History of Present Illness:    [name] is a 76 y.o. female with a hx of the following:   Hypertension Obesity Coronary artery disease Hyperlipidemia Paroxysmal atrial tachycardia, s/p ablation PVCs OSA GERD Acquired hypothyroidism CKD Renal cell carcinoma s/p nephrectomy Generalized anxiety disorder ADHD  She was last seen by Dr. Harrell Gave on October 20, 2020. During that time she had been to the emergency department for 2 visits. She says that she had been hearing her heartbeat and feeling a pounding.  She was told that her symptoms were caused by anxiety rather than her heart while in the ED.  She was receiving treatment for her anxiety and she believes the main source of her anxiety came from her current living situation at that time.  Her other chief concern during that office visit was her elevated blood pressure readings and she had said her systolic readings range from 145-189 at home in the last month.  She said that while she was on metoprolol, her blood pressure improved a little bit but still was elevated. She was told to continue losartan-hydrochlorothiazide 50-12.5 mg daily, continue to follow-up closely for management of her anxiety and depression, continue Crestor 5 mg daily, and lifestyle modification was recommended for her and to follow-up in 1 year or sooner as needed with cardiology.   I last saw this patient on October 12, 2021 with chief complaint of palpitations, associated with anxiety. She also presented for surgical clearance for hernia repair but this was not an urgent request. Presented with CC of palpitations that seemed to be related to anxiety. Anxiety was a daily  occurrence for her, significant history of anxiety and depression. Was only taking metoprolol once per day instead of twice per day.She was tearful when I interviewed her that day. Denied any SI, domestic abuse, or thoughts of hurting herself or other people.  Admitted to shortness of breath with activity and sometimes with rest. Fatigued easily on exertion and felt out of shape. Admitted to minimal chest pressure with exertion, that was brief in duration, and rest helped. Admitted to two episodes where she felt a little lightheaded, denied syncope, hx of chronic vertigo. Noncompliant with CPAP for OSA and she said she needed to go back and get a CPAP. Attributed SBP of 150's to anxiety. Arranged a Echo and monitor at this visit, however these were not completed by patient.   Today she presents for recent issues with blood pressure. Says blood pressures been elevated recently.  She states she has not been doing well for the past 24 hours. Admits to elevated BP, nausea, profuse sweating, anxiety, and depression along with severe BP readings. Said BP at home was elevated at 190/109, and recheck was high in both arms. Another BP check was 850-277 systolic. Called EMS who evaluated her and recommended she go to the ED to be evaluated but she refused. Recommended she take an extra dose of Hyzaar and BP improved, believes her elevated BP is related to her anxiety.. She denies any CP and SHOB associated with her symptoms. Had difficulty obtaining HPI today, patient described her depression and anxiety, became very tearful during interview, shaking,  and difficulty concentrating. Unable to complete cardiovascular ROS due to emotional distress during interview. GAD-7 Score: 21. PHQ-9 Score: 24.   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 FFR of LAD  not performed because of difficulty with catheter control from the right radial.    Chronic cough 02/22/2020   Chronic kidney disease    hx of kidney cancer   Diastolic dysfunction, left ventricle 05/31/2013   Difficult intubation    told by [facility] that she was hard to intubate 15 yrs ago in [place]- surgery since then no problems   Dyspnea    Essential hypertension 05/31/2013   Generalized anxiety disorder    Adequate for discharge    GERD (gastroesophageal reflux disease)    History of attention deficit disorder    History of echocardiogram 07/2012   Normal LVF w grade I siastolic dysfunction    History of endometriosis    History of gall stones 12/31/2009   History of pleural effusion    Hyperlipidemia 06/24/2016   LDL goal <70   Hypertension    Hypothyroidism 1990   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 Elsworth Soho discussed inspire device, patient would need to lose weight   PAT (paroxysmal atrial tachycardia)    s/p ablation   PONV (postoperative nausea and vomiting)    PTSD (post-traumatic stress disorder)    Pure hypercholesterolemia 02/18/2019   PVC's (premature ventricular contractions)    Renal mass 02/15/2012   Restless legs syndrome (RLS) 02/20/2007   Vertigo     Past Surgical History:  Procedure  Laterality Date   CARDIAC ELECTROPHYSIOLOGY MAPPING AND ABLATION  2000s   LAPAROSCOPIC NEPHRECTOMY, HAND ASSISTED Right 07/31/2019   Procedure: HAND ASSISTED LAPAROSCOPIC NEPHRECTOMY CONVERTED TO OPEN WITH REPAIR OF INFERIOR VENA CAVAOTOMY;  Surgeon: Robley Fries, MD;  Location: [facility];  Service: Urology;  Laterality: Right;  3 HRS   laparoscopy     LEFT HEART CATH AND CORONARY ANGIOGRAPHY N/A 06/08/2016   Procedure: Left Heart Cath and Coronary Angiography;  Surgeon: Belva Crome, MD;  Location: [facility];  Service: Cardiovascular;  Laterality: N/A;   nasoseptal reconstruction  1990s   NEPHRECTOMY RECIPIENT Right 08/2019   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   Funk     as a child   TUBAL LIGATION  1970s   uterine mass removal  03/2012   was found to be benign    Current Medications: Current Meds  Medication Sig   acetaminophen (TYLENOL) 500 MG tablet Take 500-1,000 mg by mouth every 6 (six) hours as needed  for mild pain (or headaches).   aspirin 81 MG EC tablet 1 tablet   b complex vitamins tablet Take 1 tablet by mouth daily.   Cholecalciferol (VITAMIN D3) 125 MCG (5000 UT) CAPS Take 5,000 Units by mouth in the morning.   diazepam (VALIUM) 2 MG tablet Take 2 mg by mouth daily.   diphenhydrAMINE (BENADRYL) 25 mg capsule Take 25 mg by mouth every 6 (six) hours as needed for allergies.   DULoxetine (CYMBALTA) 20 MG capsule Take by mouth.   DULoxetine HCl 40 MG CPEP Take 1 capsule by mouth every morning.   FLUoxetine (PROZAC) 20 MG capsule Take 20 mg by mouth daily.   gatifloxacin (ZYMAXID) 0.5 % SOLN Place 1 drop into the right eye 4 (four) times daily.   HYDROcodone-acetaminophen (NORCO/VICODIN) 5-325 MG tablet Take 1 tablet by mouth every 8 (eight) hours as needed.   ipratropium (ATROVENT) 0.06 % nasal spray Place 2 sprays into both nostrils in the morning.   levothyroxine (SYNTHROID) 88 MCG tablet Take 88 mcg by mouth every morning.    LORazepam (ATIVAN) 0.5 MG tablet Take 1 tablet (0.5 mg total) by mouth daily as needed for anxiety.   losartan-hydrochlorothiazide (HYZAAR) 50-12.5 MG tablet TAKE 1 TABLET BY MOUTH TWICE A DAY   methylphenidate (RITALIN) 5 MG tablet Take 5 mg by mouth 2 (two) times daily.   metoprolol tartrate (LOPRESSOR) 25 MG tablet Take 25 mg by mouth 2 (two) times daily.   MINERAL ICE 2 % GEL Apply 1 application topically at bedtime as needed (to painful sites).   Omega 3 1000 MG CAPS Take 1,000 mg by mouth daily.   ondansetron (ZOFRAN-ODT) 8 MG disintegrating tablet Take 8 mg by mouth every 8 (eight) hours as needed for nausea or vomiting (dissolve orally).   polyethylene glycol powder (GLYCOLAX/MIRALAX) 17 GM/SCOOP powder Take 17 g by mouth See admin instructions. Mix 17 grams of powder into a fruit smoothie and drink every morning   prednisoLONE acetate (PRED FORTE) 1 % ophthalmic suspension    predniSONE (DELTASONE) 20 MG tablet Take by mouth.   promethazine (PHENERGAN) 25 MG tablet SMARTSIG:1 Tablet(s) By Mouth Every 12 Hours PRN   rOPINIRole (REQUIP) 0.5 MG tablet TAKE 1 TABLET BY MOUTH EVERY DAY AFTER SUPPER AND TAKE 2 TABLETS AT BEDTIME   SALONPAS 3.03-27-08 % PTCH Apply 1 patch topically See admin instructions. Apply 1 patch to affected sites one to two times a day as needed for pain   traMADol (ULTRAM) 50 MG tablet    vitamin C (ASCORBIC ACID) 500 MG tablet Take 500 mg by mouth daily.     Allergies:   Geodon [ziprasidone hydrochloride], Lithium, Talwin [pentazocine], Toradol [ketorolac tromethamine], Ziprasidone, Abilify [aripiprazole], Compazine [prochlorperazine edisylate], Latuda [lurasidone hcl], Pristiq [desvenlafaxine succinate er], Rexulti [brexpiprazole], and Diflucan [fluconazole]   Social History   Socioeconomic History   Marital status: Single    Spouse name: Not on file   Number of children: 1   Years of education: 14   Highest education level: Some college, no degree  Occupational  History   Occupation: Retired  Tobacco Use   Smoking status: Never   Smokeless tobacco: Never  Vaping Use   Vaping Use: Never used  Substance and Sexual Activity   Alcohol use: Not Currently    Alcohol/week: 1.0 standard drink of alcohol    Types: 1 Glasses of wine per week    Comment: occas   Drug use: No   Sexual activity: Yes  Partners: Male    Birth control/protection: None  Other Topics Concern   Not on file  Social History Narrative   Divorced and lives alone   Has children   Caffeine use: none   Right handed    Social Determinants of Health   Financial Resource Strain: Low Risk  (11/26/2021)   Overall Financial Resource Strain (CARDIA)    Difficulty of Paying Living Expenses: Not hard at all  Food Insecurity: No Food Insecurity (12/01/2021)   Hunger Vital Sign    Worried About Running Out of Food in the Last Year: Never true    Ran Out of Food in the Last Year: Never true  Transportation Needs: No Transportation Needs (12/01/2021)   PRAPARE - Hydrologist (Medical): No    Lack of Transportation (Non-Medical): No  Physical Activity: Inactive (11/26/2021)   Exercise Vital Sign    Days of Exercise per Week: 0 days    Minutes of Exercise per Session: 0 min  Stress: Stress Concern Present (11/26/2021)   [place]    Feeling of Stress : To some extent  Social Connections: Not on file     Family History: The patient's family history includes Alcohol abuse in her father; Allergies in her daughter and father; Autoimmune disease in her brother; CAD in her mother; Cancer in her paternal grandfather; Colon cancer in her paternal grandmother; Depression in her mother; Heart disease in her maternal grandfather and mother; Hypertension in her mother; Mental illness in her father and maternal grandmother; Multiple sclerosis in her sister; OCD in her paternal grandmother; Parkinson's disease  in her paternal grandfather; Skin cancer in her father.  ROS:   Review of Systems  Constitutional:  Positive for diaphoresis and malaise/fatigue. Negative for chills, fever and weight loss.       See HPI.   HENT: Negative.    Eyes: Negative.   Skin: Negative.   Neurological:  Positive for tremors. Negative for dizziness, tingling, sensory change, speech change, focal weakness, seizures, loss of consciousness, weakness and headaches.       Shaking/trembling associated with anxiety according to patient's report.   Psychiatric/Behavioral:  Positive for depression and suicidal ideas. Negative for hallucinations, memory loss and substance abuse. The patient is nervous/anxious. The patient does not have insomnia.    Please see the history of present illness.    All other systems reviewed and are negative.  EKGs/Labs/Other Studies Reviewed:    The following studies were reviewed today:   EKG:  EKG is not ordered today.   Nuclear medicine stress test on Aug 12, 2020: Left ventricular ejection fraction is normal at between 55 to 65%. Nuclear stress EF is 63%. There was no ST segment deviation noted during stress. No T wave inversion was noted during stress. This study is normal. This is a low risk study. 1.  Normal study without evidence of ischemia or infarction. 2.  Normal left ventricular function, EF 63%. 3.  This is a low risk study.  7-day ZIO monitor on March 20, 2019: Minimum heart rate of 43 bpm, maximal heart rate of 193 bpm, and an average heart rate of 66 bpm.  Predominantly underlying rhythm was sinus rhythm.  No VT, atrial fibrillation, high degree block, or pauses noted 1 true SVT noted, 8 beats at 193 bpm.  2 other events labeled as SVT are either sinus or difficult to determine.  Isolated atrial and ventricular ectopy was  rare, less than 1%.  There were 42 triggered events, the vast majority of which were sinus rhythm with or without brief ectopy.  There is 1 significant  artifact on many on the triggered events.  There was one episode of ventricular trigeminy lasting approximately 10 seconds.  No high risk findings.  Left heart cath and coronary angiography on June 08, 2016: 50 to 70% stenosis in the mid LAD proximal to the first diagonal.  FFR not performed because of difficulty with catheter control from the right radial. 75 to 80% stenosis in the midsegment of the first obtuse marginal. Luminal irregularities in the mid RCA. Overall normal LV function with EF percent.  Recommendations: Aggressive risk factor modification and anti-ischemic therapy (beta-blocker, statin therapy, and possibly LAD nitrates) in this non-- ACS subset.  PCI in the setting does not meet appropriate use criteria. If limiting symptoms on medications or convincing evidence of ischemia, consider obtuse marginal PCI and/or higher use usual risk PCI on the LAD (due to calcification and angulation).  Coronary CTA on February 25, 2016: Coronary calcium score of 364.  This was 69 percentile for age and sex matched control. Normal coronary origin with right dominance. One-vessel CAD with moderate disease in the ostial/proximal LAD and moderate, possibly severe stenosis in the mid LAD.  Carotid Doppler on April 22, 2013: Findings suggest 1 to 39% internal carotid artery stenosis bilaterally.  The left vertebral artery is patent with antegrade flow.  Unable to visualize the right vertebral artery.  2D echo on April 22, 2013: Left ventricular ejection fraction was in the range of 55 to 60%. Trileaflet aortic valve with no stenosis and no regurgitation.  The aortic root was normal in size.  Structurally normal mitral valve.  No evidence of mitral valve stenosis.  No mitral valve regurgitation.  All other findings normal.  Recent Labs: 10/20/2021: ALT 14; BUN 14; Creatinine, Ser 1.01; Hemoglobin 12.5; Platelets 228; Potassium 3.9; Sodium 139; TSH 0.451  Recent Lipid Panel    Component  Value Date/Time   CHOL 177 05/26/2018 0625   TRIG 68 05/26/2018 0625   HDL 47 05/26/2018 0625   CHOLHDL 3.8 05/26/2018 0625   VLDL 14 05/26/2018 0625   LDLCALC 116 (H) 05/26/2018 0625      Physical Exam:    VS:  BP (!) 114/58 (BP Location: Left Arm, Patient Position: Sitting)   Pulse 79   Wt 232 lb (105.2 kg)   SpO2 98%   BMI 41.10 kg/m     Wt Readings from Last 3 Encounters:  01/13/22 232 lb (105.2 kg)  12/29/21 238 lb 6.4 oz (108.1 kg)  10/12/21 237 lb (107.5 kg)     GEN: Obese 76 y.o. Caucasian female in emotional distress HEENT: Normal NECK: No JVD; No carotid bruits CARDIAC: S1/S2, RRR, no murmurs, rubs, gallops; 2+ peripheral pulses throughout, strong and equal bilaterally RESPIRATORY:  Clear and diminished to auscultation without rales, wheezing or rhonchi  MUSCULOSKELETAL:  No edema; No deformity  SKIN: Overall pale in appearance, warm and dry, no obvious signs of bruising or abrasions on skin.  NEUROLOGIC:  Alert and oriented x 3 PSYCHIATRIC:  Tearful, shaking, difficulty concentrating, flat affect. Appears severely depressed and anxious.   GAD-7: 21 PHQ-9: 24  ASSESSMENT:    1. Severe depression ([place])   2. Severe anxiety   3. Hypertension, unspecified type     PLAN:    Patient is a 76 y.o. Caucasian female with PMH of Hypertension, CAD, hyperlipidemia, PAF, status  post ablation, PVCs, obesity, OSA, GERD, acquired hypothyroidism, CKD, renal cell carcinoma status post nephrectomy, ADHD, GAD, and depression.  Chief complaint of episode of severe blood pressure elevation along with nausea, profuse sweating, anxiety, and feeling hot within the past 24 hours.  Attributes her symptoms to significant history of anxiety.  GAD-7 and PHQ-9 scores today reveal severe anxiety and depression.  On exam she appears to be in significant emotional distress.  Flat affect, and appeared depressed and anxious.  Very tearful during interview and had difficulty obtaining HPI and  cardiovascular ROS due to patient status.  BP today is stable at 114/58, heart rate 79, SPO2 98%.  She is not short of breath on exam. She expressed recent thoughts of SI, no plan. Because there was concern about patient's safety, decided best course of action was transportation to ED for evaluation, and patient agreed. Social worker in office also evaluated patient who felt it was best to transport patient to ED. Due to nature of patient appearance (very tearful, flat affect, difficulty concentrating), HPI, patient's thoughts of SI without a plan, and patient's significant emotional distress, patient was agreeable for further evaluation in the ED and agreeable to be transferred there. Because of patient's history of noncompliance with medical recommendations due to patient's severe depression, patient was advised to wait for transportation to ED, and she agreed. Was concerned that because of her history of noncompliance, she would not go to the ED by herself and felt it was best to have her transported there with close observation and due to reported thoughts of SI. 1:1 observation of patient completed by this NP, social worker Westley Hummer, and RN Jenna Bullins until transportation arrived.   Medication Adjustments/Labs and Tests Ordered: Current medicines are reviewed at length with the patient today.  Concerns regarding medicines are outlined above.  No orders of the defined types were placed in this encounter.  No orders of the defined types were placed in this encounter.   Patient Instructions  Medication Instructions:  No Changes In Medications at this time.  *If you need a refill on your cardiac medications before your next appointment, please call your pharmacy*  Lab Work: None Ordered At This Time.  If you have labs (blood work) drawn today and your tests are completely normal, you will receive your results only by: [place] (if you have MyChart) OR A paper copy in the mail If  you have any lab test that is abnormal or we need to change your treatment, we will call you to review the results.  Testing/Procedures: None Ordered At This Time.    Follow-Up: At [facility], you and your health needs are our priority.  As part of our continuing mission to provide you with exceptional heart care, we have created designated Provider Care Teams.  These Care Teams include your primary Cardiologist (physician) and Advanced Practice Providers (APPs -  Physician Assistants and Nurse Practitioners) who all work together to provide you with the care you need, when you need it.  Your next appointment:   TO BE DETERMINED   The format for your next appointment:   In Person  Provider:   Buford Dresser, MD           Signed, Finis Bud, NP  01/13/2022 5:10 PM    [place]

## 2022-01-13 NOTE — Patient Instructions (Signed)
Medication Instructions:  No Changes In Medications at this time.  *If you need a refill on your cardiac medications before your next appointment, please call your pharmacy*  Lab Work: None Ordered At This Time.  If you have labs (blood work) drawn today and your tests are completely normal, you will receive your results only by: [place] (if you have MyChart) OR A paper copy in the mail If you have any lab test that is abnormal or we need to change your treatment, we will call you to review the results.  Testing/Procedures: None Ordered At This Time.    Follow-Up: At [facility], you and your health needs are our priority.  As part of our continuing mission to provide you with exceptional heart care, we have created designated Provider Care Teams.  These Care Teams include your primary Cardiologist (physician) and Advanced Practice Providers (APPs -  Physician Assistants and Nurse Practitioners) who all work together to provide you with the care you need, when you need it.  Your next appointment:   TO BE DETERMINED   The format for your next appointment:   In Person  Provider:   Buford Dresser, MD

## 2022-01-13 NOTE — ED Notes (Signed)
Pt refuse to change out state she feel safe with her own clothes on. RN aware

## 2022-01-21 ENCOUNTER — Other Ambulatory Visit (INDEPENDENT_AMBULATORY_CARE_PROVIDER_SITE_OTHER): Payer: Medicare Other

## 2022-01-21 DIAGNOSIS — I10 Essential (primary) hypertension: Secondary | ICD-10-CM | POA: Diagnosis not present

## 2022-01-21 LAB — ECHOCARDIOGRAM COMPLETE
Area-P 1/2: 3.37 cm2
MV M vel: 4.47 m/s
MV Peak grad: 79.9 mmHg
S' Lateral: 2.62 cm

## 2022-01-26 ENCOUNTER — Other Ambulatory Visit: Payer: Self-pay | Admitting: Cardiology

## 2022-01-26 NOTE — Telephone Encounter (Signed)
Rx request sent to pharmacy.  

## 2022-01-28 ENCOUNTER — Ambulatory Visit (HOSPITAL_BASED_OUTPATIENT_CLINIC_OR_DEPARTMENT_OTHER)
Admission: RE | Admit: 2022-01-28 | Discharge: 2022-01-28 | Disposition: A | Discharge status: Home / Self Care | Payer: Medicare Other | Source: Ambulatory Visit | Attending: Oncology | Admitting: Oncology

## 2022-01-28 DIAGNOSIS — K8020 Calculus of gallbladder without cholecystitis without obstruction: Secondary | ICD-10-CM | POA: Diagnosis not present

## 2022-01-28 DIAGNOSIS — R911 Solitary pulmonary nodule: Secondary | ICD-10-CM | POA: Diagnosis not present

## 2022-01-28 DIAGNOSIS — C641 Malignant neoplasm of right kidney, except renal pelvis: Secondary | ICD-10-CM | POA: Insufficient documentation

## 2022-01-28 DIAGNOSIS — K439 Ventral hernia without obstruction or gangrene: Secondary | ICD-10-CM | POA: Diagnosis not present

## 2022-01-28 DIAGNOSIS — K760 Fatty (change of) liver, not elsewhere classified: Secondary | ICD-10-CM | POA: Diagnosis not present

## 2022-02-01 ENCOUNTER — Telehealth: Payer: Self-pay

## 2022-02-01 NOTE — Telephone Encounter (Signed)
-----   Message from Wyatt Portela, MD sent at 02/01/2022  9:20 AM EST ----- Please let her know her scan shows no evidence of cancer

## 2022-02-01 NOTE — Telephone Encounter (Signed)
LM for pt with CT scan results

## 2022-02-18 DIAGNOSIS — R5381 Other malaise: Secondary | ICD-10-CM | POA: Diagnosis not present

## 2022-02-18 DIAGNOSIS — R110 Nausea: Secondary | ICD-10-CM | POA: Diagnosis not present

## 2022-02-18 DIAGNOSIS — N1831 Chronic kidney disease, stage 3a: Secondary | ICD-10-CM | POA: Diagnosis not present

## 2022-02-18 DIAGNOSIS — E039 Hypothyroidism, unspecified: Secondary | ICD-10-CM | POA: Diagnosis not present

## 2022-02-18 DIAGNOSIS — M797 Fibromyalgia: Secondary | ICD-10-CM | POA: Diagnosis not present

## 2022-02-18 DIAGNOSIS — I10 Essential (primary) hypertension: Secondary | ICD-10-CM | POA: Diagnosis not present

## 2022-02-18 DIAGNOSIS — Z23 Encounter for immunization: Secondary | ICD-10-CM | POA: Diagnosis not present

## 2022-03-05 ENCOUNTER — Ambulatory Visit: Payer: Self-pay | Admitting: General Surgery

## 2022-03-05 DIAGNOSIS — K432 Incisional hernia without obstruction or gangrene: Secondary | ICD-10-CM | POA: Diagnosis not present

## 2022-03-05 NOTE — H&P (Signed)
Chief Complaint: Follow-up (Incisional hernia)       History of Present Illness: JENAVIVE LAMBOY is a 76 y.o. female who is seen today as an office consultation at the request of Dr. Pasty Arch for evaluation of Follow-up (Incisional hernia) .     Patient is a 76 year old female, who comes in secondary to an incisional hernia. Patient has been seen multiple occasions.  She does have a significant history of major depressive disorder.   She states that she feels her heart is getting larger.  She previously underwent CT scan.  This was significant for approximate 10 cm incisional hernia.  This is in the midline.  Patient also with a right subcostal incision.     Patient states that she lost approximately 15 pounds.  She feels that she has plateaued.   Review of Systems: A complete review of systems was obtained from the patient.  I have reviewed this information and discussed as appropriate with the patient.  See HPI as well for other ROS.   Review of Systems  Constitutional:  Negative for fever.  HENT:  Negative for congestion.   Eyes:  Negative for blurred vision.  Respiratory:  Negative for cough, shortness of breath and wheezing.   Cardiovascular:  Negative for chest pain and palpitations.  Gastrointestinal:  Negative for heartburn.  Genitourinary:  Negative for dysuria.  Musculoskeletal:  Negative for myalgias.  Skin:  Negative for rash.  Neurological:  Negative for dizziness and headaches.  Psychiatric/Behavioral:  Negative for depression and suicidal ideas.   All other systems reviewed and are negative.       Medical History: Past Medical History Past Medical History: Diagnosis Date  Anxiety    Arthritis    Chronic kidney disease    History of cancer    Hypertension    Sleep apnea    Thyroid disease        There is no problem list on file for this patient.     Past Surgical History Past Surgical History: Procedure Laterality Date  NEPHRECTOMY N/A     THYROIDECTOMY TOTAL N/A        Allergies Allergies Allergen Reactions  Ketorolac Other (See Comments)     Chest pains Chest pains    Lithium Other (See Comments) and Nausea     Off balance, increased heart rate Off balance, increased heart rate    Ziprasidone Other (See Comments)     Extremely agitated Extreme agitation Extremely agitated    Brexpiprazole Other (See Comments) and Swelling     Elevated BP, created aggression Elevated BP, created aggression    Fluconazole Other (See Comments) and Nausea And Vomiting  Lurasidone Other (See Comments) and Unknown     Reports made her mind race more and irritable  Reports made her mind race more and made her irritable     Prochlorperazine Nausea And Vomiting  Tramadol Other (See Comments)      Current Outpatient Medications on File Prior to Visit Medication Sig Dispense Refill  b complex vitamins tablet Take 1 tablet by mouth once daily      levothyroxine (SYNTHROID) 75 MCG tablet TAKE 1 TABLET BY MOUTH EVERY MORNING ON AN EMPTY STOMACH 90      losartan-hydrochlorothiazide (HYZAAR) 50-12.5 mg tablet 1 tablet      metoprolol tartrate (LOPRESSOR) 25 MG tablet 1 tablet with food      ondansetron (ZOFRAN-ODT) 8 MG disintegrating tablet Take by mouth      polyethylene glycol (MIRALAX) powder Take  by mouth      predniSONE (DELTASONE) 10 mg tablet pack TAKE 6 TABLETS ON DAY 1 AS DIRECTED ON PACKAGE AND DECREASE BY 1 TAB EACH DAY FOR A TOTAL OF 6 DAYS      promethazine (PHENERGAN) 12.5 MG tablet TAKE 1 TABLET THREE TIMES A DAY AS NEEDED FOR NAUSEA 3 DAYS      rOPINIRole (REQUIP) 0.5 MG tablet 2 tablets      rosuvastatin (CRESTOR) 5 MG tablet Take 1 tablet (5 mg total) by mouth once daily      traZODone (DESYREL) 100 MG tablet Take by mouth       No current facility-administered medications on file prior to visit.     Family History Family History Problem Relation Age of Onset  Stroke Mother    Heart valve disease Mother     Skin cancer Father    Colon cancer Father    Obesity Sister        Social History   Tobacco Use Smoking Status Never Smokeless Tobacco Never     Social History Social History    Socioeconomic History  Marital status: Single Tobacco Use  Smoking status: Never  Smokeless tobacco: Never Vaping Use  Vaping Use: Never used Substance and Sexual Activity  Alcohol use: Never  Drug use: Never      Objective:     Vitals:   03/05/22 1026 PainSc: 0-No pain   There is no height or weight on file to calculate BMI.   Physical Exam Constitutional:      Appearance: Normal appearance.  HENT:     Head: Normocephalic and atraumatic.     Mouth/Throat:     Mouth: Mucous membranes are moist.     Pharynx: Oropharynx is clear.  Eyes:     General: No scleral icterus.    Pupils: Pupils are equal, round, and reactive to light.  Cardiovascular:     Rate and Rhythm: Normal rate and regular rhythm.     Pulses: Normal pulses.     Heart sounds: No murmur heard.    No friction rub. No gallop.  Pulmonary:     Effort: Pulmonary effort is normal. No respiratory distress.     Breath sounds: Normal breath sounds. No stridor.  Abdominal:     General: Abdomen is flat.     Musculoskeletal:        General: No swelling.  Skin:    General: Skin is warm.  Neurological:     General: No focal deficit present.     Mental Status: She is alert and oriented to person, place, and time. Mental status is at baseline.  Psychiatric:        Mood and Affect: Mood normal.        Thought Content: Thought content normal.        Judgment: Judgment normal.        Hernia Size:10cm Incarcerated: no Initial Hernia     Assessment and Plan: Diagnoses and all orders for this visit:   Incisional hernia without obstruction or gangrene     CARMELLE BAMBERG is a 76 y.o. female    1.  We will proceed to the OR for a open incisional hernia repair with mesh, likely retrorectus. 2. All risks and  benefits were discussed with the patient, to generally include infection, bleeding, damage to surrounding structures, acute and chronic nerve pain, and recurrence. Alternatives were offered and described.  All questions were answered and the patient voiced understanding of the  procedure and wishes to proceed at this point.             No follow-ups on file.   Ralene Ok, MD, [facility], [facility]

## 2022-03-08 ENCOUNTER — Telehealth (HOSPITAL_BASED_OUTPATIENT_CLINIC_OR_DEPARTMENT_OTHER): Payer: Self-pay | Admitting: Cardiology

## 2022-03-08 NOTE — Telephone Encounter (Signed)
Her blood pressure has been previously well controlled. Please inquire how she is taking BP. If taking before meds or during times of acute stress less likely to be accurate.   Please ensure taking Losartan-HCTZ twice daily as prescribed.   If heart rate is more than 60 bpm she may take Metoprolol. Unclear what her heart rate is as not in initial phone encounter.   May Rx Hydralazine PRN BID for SBP >160. Recommend office visit with Dr. Harrell Gave or APP in 1-3 weeks.   Loel Dubonnet, NP

## 2022-03-08 NOTE — Telephone Encounter (Signed)
Pt c/o BP issue: STAT if pt c/o blurred vision, one-sided weakness or slurred speech  1. What are your last 5 BP readings?   194/92 184/103 180/103 180/80 162/85 180/83  2. Are you having any other symptoms (ex. Dizziness, headache, blurred vision, passed out)?   No.  Patient stated she feels woozy.  3. What is your BP issue?     Pt c/o medication issue:  1. Name of Medication:   metoprolol tartrate (LOPRESSOR) 25 MG tablet   2. How are you currently taking this medication (dosage and times per day)? Not taking as prescribed because pulse is too low  3. Are you having a reaction (difficulty breathing--STAT)?   No  4. What is your medication issue?   Patient stated she has not been able to take this medication as prescribed as her BP has been low.  Patient stated her blood pressure has been staying high.

## 2022-03-08 NOTE — Telephone Encounter (Signed)
Returned call to patient,   Patient states that she takes them at 10am and 2pm. She notes that she takes her pressure before her medications. Patient still taking her medications as prescribed but is holding second dose of metoprolol when HR less than 60. Advised patient to check pressures 2 hours after medication admin and then call us in one week if not improved.

## 2022-04-06 ENCOUNTER — Telehealth: Payer: Self-pay | Admitting: Oncology

## 2022-04-06 ENCOUNTER — Telehealth (HOSPITAL_BASED_OUTPATIENT_CLINIC_OR_DEPARTMENT_OTHER): Payer: Self-pay | Admitting: Cardiology

## 2022-04-06 NOTE — Telephone Encounter (Signed)
Pt c/o BP issue: STAT if pt c/o blurred vision, one-sided weakness or slurred speech  1. What are your last 5 BP readings?  01/16 1:00 am 156/101 HR 65 5:00 am 159/94 HR 73 1:00 pm 150/90 HR 65    2. Are you having any other symptoms (ex. Dizziness, headache, blurred vision, passed out)? Headaches, but thinks she also has a sinus infection   3. What is your BP issue? Hypertension even with medications   Is wanting an appt regarding this

## 2022-04-06 NOTE — Telephone Encounter (Signed)
Returned call to patient,   Blood pressure has been getting higher and higher, 01/16 1:00 am 156/101 HR 65 5:00 am 159/94 HR 73 1:00 pm 150/90 HR 65 . She states they have been going up for several months. She states that most days she isn't able to take two of the metoprolol due to a low pulse. She states that she heard that metoprolol can interfere with sleep patterns. She states even with trazodone '150mg'$  she hasn't been able to sleep for months. She would like an appointment to discuss this and her blood pressure.

## 2022-04-06 NOTE — Telephone Encounter (Signed)
Called patient regarding providers departure, patient is notified. Rescheduled patient with new provider.

## 2022-04-08 ENCOUNTER — Ambulatory Visit (HOSPITAL_BASED_OUTPATIENT_CLINIC_OR_DEPARTMENT_OTHER): Payer: Medicare Other | Admitting: Cardiology

## 2022-04-09 DIAGNOSIS — R0981 Nasal congestion: Secondary | ICD-10-CM | POA: Diagnosis not present

## 2022-04-13 DIAGNOSIS — N1831 Chronic kidney disease, stage 3a: Secondary | ICD-10-CM | POA: Diagnosis not present

## 2022-04-13 NOTE — Progress Notes (Deleted)
NEUROLOGY FOLLOW UP OFFICE NOTE  [name] [id number]  Assessment/Plan:   Major depressive disorder - this has been a cause for multiple somatic symptoms such as numbness, balance problems, weakness, and subjective memory complaints.  She will continue working with her psychiatrist and therapist.       Subjective:  [name] is a 77 year old female with CAD, HTN, hypothyroidism, anxiety, PTSD and CKD with history of kidney cancer who follows up for weakness.  UPDATE: ***   HISTORY: Patient has history of vertigo.  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  She has history of reported brain fog and short term memory problems since at least 2011.  MRI of brain with and without contrast on 02/12/2010 to assess confusion was personally reviewed and was unremarkable, as was a subsequent MRI of brain without contrast, personally reviewed, from 04/20/2013.  Neuropsychological evaluation on 08/22/2020 demonstrated some isolated impairments but overall unremarkable with findings likely related to her psychiatric comorbidities (ADHD, depression, anxiety) rather than underlying neurocognitive disorder.  She reports balance problems over the past year.  She feels weak in the legs.  However, she has history of chronic pain in the joints, such as the hips and knees.  She has arthritis in the right knee. Since early 2022, she reports numbness and tingling as well as pain in the feet and tingling and numbness in the hands.  She has acquired hypothyroidism which is treated and monitored.  TSH from June 2022 was 1.485.  B12 on 01/15/2021 was 938.  NCV-EMG on 03/03/2021 was normal.  She has low back pain and aching in the legs.  Denies neck pain.  Over the summer 2022, it looked like that she may have had mild facial asymmetry, with slight droop on the right.  MRI of brain without contrast on 11/05/2020 personally reviewed showed no acute intracranial abnormality.   It has since resolved.  She reports one other episode in the past.  She is concerned about her balance and leg weakness because her sister, who has MS, has similar symptoms.  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 FFR of LAD  not performed because of difficulty with catheter control from the right radial.    Chronic cough 02/22/2020   Chronic kidney disease    hx of kidney cancer   Diastolic dysfunction, left ventricle 05/31/2013   Difficult intubation    told by [facility] that she was hard to intubate 15 yrs ago in [place]- surgery since then no problems   Dyspnea    Essential hypertension 05/31/2013   Generalized anxiety disorder    Adequate for discharge    GERD (gastroesophageal reflux disease)    History of attention deficit disorder    History of echocardiogram 07/2012   Normal LVF w grade I siastolic dysfunction    History of endometriosis    History of gall stones 12/31/2009   History of pleural effusion  Hyperlipidemia 06/24/2016   LDL goal <70   Hypertension    Hypothyroidism 1990   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 Elsworth Soho discussed inspire device, patient would need to lose weight   PAT (paroxysmal atrial tachycardia)    s/p ablation   PONV (postoperative nausea and vomiting)    PTSD (post-traumatic stress disorder)    Pure hypercholesterolemia 02/18/2019   PVC's (premature ventricular contractions)    Renal mass 02/15/2012   Restless legs syndrome (RLS) 02/20/2007   Vertigo     MEDICATIONS: Current Outpatient Medications on File Prior to Visit  Medication Sig Dispense Refill   acetaminophen (TYLENOL) 500 MG tablet Take 500-1,000 mg by mouth every 6 (six) hours as needed for mild pain (or headaches). (Patient not taking: Reported on 04/30/2021)     aspirin 81 MG EC tablet 1 tablet     b complex vitamins tablet Take 1 tablet by mouth daily.     Cholecalciferol (VITAMIN D3) 125 MCG (5000 UT) CAPS Take 5,000 Units by mouth in the morning.     diphenhydrAMINE (BENADRYL) 25 mg capsule Take 25 mg by mouth every 6 (six) hours as needed for allergies.     FLUoxetine (PROZAC) 20 MG capsule Take 20 mg by mouth daily.     gatifloxacin (ZYMAXID) 0.5 % SOLN Place 1 drop into the right eye 4 (four) times daily.     ipratropium (ATROVENT) 0.06 % nasal spray Place 2 sprays into both nostrils in the morning.     levothyroxine (SYNTHROID) 88 MCG tablet Take 88 mcg by mouth every morning.     LORazepam (ATIVAN) 0.5 MG tablet Take 1 tablet (0.5 mg total) by mouth daily as needed for anxiety. 30 tablet 0   losartan-hydrochlorothiazide (HYZAAR) 50-12.5 MG tablet Take 1 tablet by mouth 2 (two) times daily. 180 tablet 3   methylphenidate (RITALIN) 5 MG tablet Take 5 mg by mouth 2 (two) times daily.     metoprolol tartrate (LOPRESSOR) 25 MG tablet Take 25 mg by mouth 2 (two) times daily.     MINERAL ICE 2 % GEL Apply 1 application topically at bedtime as needed (to painful sites).     Omega 3 1000 MG CAPS Take 1,000 mg by mouth daily.     ondansetron (ZOFRAN-ODT) 8 MG disintegrating tablet Take 8 mg by mouth every 8  (eight) hours as needed for nausea or vomiting (dissolve orally).     polyethylene glycol powder (GLYCOLAX/MIRALAX) 17 GM/SCOOP powder Take 17 g by mouth See admin instructions. Mix 17 grams of powder into a fruit smoothie and drink every morning (Patient not taking: Reported on 04/30/2021)     prednisoLONE acetate (PRED FORTE) 1 % ophthalmic suspension SMARTSIG:In Eye(s)     predniSONE (DELTASONE) 20 MG tablet Take by mouth.     promethazine (PHENERGAN) 25 MG tablet SMARTSIG:1 Tablet(s) By Mouth Every 12 Hours PRN     rOPINIRole (REQUIP) 0.5 MG tablet TAKE 1 TABLET BY MOUTH EVERY DAY AFTER SUPPER AND TAKE 2 TABLETS AT BEDTIME 90 tablet 10   rosuvastatin (CRESTOR) 5 MG tablet Take 1 tablet (5 mg total)  by mouth daily. 90 tablet 3   SALONPAS 3.03-27-08 % PTCH Apply 1 patch topically See admin instructions. Apply 1 patch to affected sites one to two times a day as needed for pain     traMADol (ULTRAM) 50 MG tablet SMARTSIG:0.5-1 Tablet(s) By Mouth 2-3 Times Daily PRN     vitamin C (ASCORBIC ACID) 500 MG tablet Take 500 mg by mouth daily.     No current facility-administered medications on file prior to visit.    ALLERGIES: Allergies  Allergen Reactions   Geodon [Ziprasidone Hydrochloride] Other (See Comments)    Extremely agitated   Lithium Nausea Only and Other (See Comments)    Off balance, increased heart rate   Talwin [Pentazocine] Other (See Comments)    Hallucinations    Toradol [Ketorolac Tromethamine] Other (See Comments)    Chest pains   Ziprasidone Other (See Comments)    Extreme agitation   Abilify [Aripiprazole] Other (See Comments)    jerking   Compazine [Prochlorperazine Edisylate] Nausea And Vomiting   Latuda [Lurasidone Hcl] Other (See Comments)    Reports made her mind race more and made her irritable    Pristiq [Desvenlafaxine Succinate Er] Other (See Comments)    Did not work, prefers not to take   Rexulti [Brexpiprazole] Other (See Comments) and Hypertension     Elevated BP, created aggression   Diflucan [Fluconazole] Nausea And Vomiting    FAMILY HISTORY: Family History  Problem Relation Age of Onset   Allergies Father    Skin cancer Father    Alcohol abuse Father    Mental illness Father    Heart disease Mother    Depression Mother    Hypertension Mother    CAD Mother    Colon cancer Paternal Grandmother    OCD Paternal Grandmother    Multiple sclerosis Sister    Autoimmune disease Brother        unknown type   Allergies Daughter    Heart disease Maternal Grandfather    Cancer Paternal Grandfather    Parkinson's disease Paternal Grandfather    Mental illness Maternal Grandmother       Objective:  *** General: No acute distress.  Patient appears well-groomed.   Head:  Normocephalic/atraumatic Eyes:  Fundi examined but not visualized Heart:  Regular rate and rhythm Neurological Exam: alert and oriented to person, place, and time.  Speech fluent and not dysarthric, language intact.  CN II-XII intact. Bulk and tone normal, muscle strength 5/5 throughout.  Sensation to light touch intact.  Deep tendon reflexes 2+ throughout.  Finger to nose testing intact.  Gait mildly broad-based. Romberg negative.   Metta Clines, DO  CC: C.Melinda Crutch, MD

## 2022-04-14 ENCOUNTER — Encounter: Payer: Self-pay | Admitting: Neurology

## 2022-04-14 ENCOUNTER — Ambulatory Visit: Payer: Medicare Other | Admitting: Neurology

## 2022-04-14 ENCOUNTER — Telehealth: Payer: Self-pay

## 2022-04-14 NOTE — Telephone Encounter (Signed)
Patient was supposed to have an appointment today but ended up going to wrong location. She is re-scheduled for 2/21 but Jhade is wondering as what to do about her neuropathy as it is worsening in her fingers.

## 2022-04-15 NOTE — Telephone Encounter (Signed)
Called patient and her voicemail is full unable to leave a message

## 2022-04-15 NOTE — Progress Notes (Addendum)
Surgical Instructions    Your procedure is scheduled on Wednesday, 04/21/22.  Report to [facility] Main Entrance "A" at 10:00 A.M., then check in with the Admitting office.  Call this number if you have problems the morning of surgery:  [phone]   If you have any questions prior to your surgery date call [phone]: Open Monday-Friday 8am-4pm If you experience any cold or flu symptoms such as cough, fever, chills, shortness of breath, etc. between now and your scheduled surgery, please notify us at the above number     Remember:  Do not eat after midnight the night before your surgery  You may drink clear liquids until 9:00am the morning of your surgery.   Clear liquids allowed are: Water, Non-Citrus Juices (without pulp), Carbonated Beverages, Clear Tea, Black Coffee ONLY (NO MILK, CREAM OR POWDERED CREAMER of any kind), and Gatorade  Patient Instructions  The night before surgery:  No food after midnight. ONLY clear liquids after midnight   Drink TWO (2) Pre- Surgery Clear Ensure before bed the night before surgery. Drink ONE (1) Pre-Surgery Clear Ensure by 9:00am the morning of        surgery. Drink in one sitting. Do not sip.  This drink was given to you during your hospital  pre-op appointment visit. Nothing else to drink after completing the  Pre-Surgery Clear Ensure.          If you have questions, please contact your surgeon's office.     Take these medicines the morning of surgery with A SIP OF WATER:  ABILIFY  DULoxetine (CYMBALTA)  ipratropium (ATROVENT) levothyroxine (SYNTHROID)  metoprolol tartrate (LOPRESSOR)  rosuvastatin (CRESTOR)   IF NEEDED: acetaminophen (TYLENOL)  diphenhydrAMINE (BENADRYL)  LORazepam (ATIVAN)  ondansetron (ZOFRAN-ODT)  promethazine (PHENERGAN)   As of today, STOP taking any Aspirin (unless otherwise instructed by your surgeon) Aleve, Naproxen, Ibuprofen, Motrin, Advil, Goody's, BC's, all herbal medications, fish oil, and  all vitamins.           Do not wear jewelry or makeup. Do not wear lotions, powders, perfumes or deodorant. Do not shave 48 hours prior to surgery.   Do not bring valuables to the hospital. Do not wear nail polish, gel polish, artificial nails, or any other type of covering on natural nails (fingers and toes) If you have artificial nails or gel coating that need to be removed by a nail salon, please have this removed prior to surgery. Artificial nails or gel coating may interfere with anesthesia's ability to adequately monitor your vital signs.  [facility] is not responsible for any belongings or valuables.    Do NOT Smoke (Tobacco/Vaping)  24 hours prior to your procedure  If you use a CPAP at night, you may bring your mask for your overnight stay.   Contacts, glasses, hearing aids, dentures or partials may not be worn into surgery, please bring cases for these belongings   For patients admitted to the hospital, discharge time will be determined by your treatment team.   Patients discharged the day of surgery will not be allowed to drive home, and someone needs to stay with them for 24 hours.   SURGICAL WAITING ROOM VISITATION Patients having surgery or a procedure may have no more than 2 support people in the waiting area - these visitors may rotate.   Children under the age of 11 must have an adult with them who is not the patient. If the patient needs to stay at the hospital during part of  their recovery, the visitor guidelines for inpatient rooms apply. Pre-op nurse will coordinate an appropriate time for 1 support person to accompany patient in pre-op.  This support person may not rotate.   Please refer to [email or web address] for the visitor guidelines for Inpatients (after your surgery is over and you are in a regular room).    Special instructions:    Oral Hygiene is also important to reduce your risk of infection.  Remember  - BRUSH YOUR TEETH THE MORNING OF SURGERY WITH YOUR REGULAR TOOTHPASTE   [facility]- Preparing For Surgery  Before surgery, you can play an important role. Because skin is not sterile, your skin needs to be as free of germs as possible. You can reduce the number of germs on your skin by washing with CHG (chlorahexidine gluconate) Soap before surgery.  CHG is an antiseptic cleaner which kills germs and bonds with the skin to continue killing germs even after washing.     Please do not use if you have an allergy to CHG or antibacterial soaps. If your skin becomes reddened/irritated stop using the CHG.  Do not shave (including legs and underarms) for at least 48 hours prior to first CHG shower. It is OK to shave your face.  Please follow these instructions carefully.     Shower the NIGHT BEFORE SURGERY and the MORNING OF SURGERY with CHG Soap.   If you chose to wash your hair, wash your hair first as usual with your normal shampoo. After you shampoo, rinse your hair and body thoroughly to remove the shampoo.  Then [employer] and genitals (private parts) with your normal soap and rinse thoroughly to remove soap.  After that Use CHG Soap as you would any other liquid soap. You can apply CHG directly to the skin and wash gently with a scrungie or a clean washcloth.   Apply the CHG Soap to your body ONLY FROM THE NECK DOWN.  Do not use on open wounds or open sores. Avoid contact with your eyes, ears, mouth and genitals (private parts). Wash Face and genitals (private parts)  with your normal soap.   Wash thoroughly, paying special attention to the area where your surgery will be performed.  Thoroughly rinse your body with warm water from the neck down.  DO NOT shower/wash with your normal soap after using and rinsing off the CHG Soap.  Pat yourself dry with a CLEAN TOWEL.  Wear CLEAN PAJAMAS to bed the night before surgery  Place CLEAN SHEETS on your bed the night before your surgery  DO  NOT SLEEP WITH PETS.   Day of Surgery: Take a shower with CHG soap. Wear Clean/Comfortable clothing the morning of surgery Do not apply any deodorants/lotions.   Remember to brush your teeth WITH YOUR REGULAR TOOTHPASTE.    If you received a COVID test during your pre-op visit, it is requested that you wear a mask when out in public, stay away from anyone that may not be feeling well, and notify your surgeon if you develop symptoms. If you have been in contact with anyone that has tested positive in the last 10 days, please notify your surgeon.    Please read over the following fact sheets that you were given.

## 2022-04-16 ENCOUNTER — Encounter (HOSPITAL_COMMUNITY): Payer: Self-pay | Admitting: Physician Assistant

## 2022-04-16 ENCOUNTER — Other Ambulatory Visit: Payer: Self-pay

## 2022-04-16 ENCOUNTER — Encounter (HOSPITAL_COMMUNITY): Payer: Self-pay

## 2022-04-16 ENCOUNTER — Encounter (HOSPITAL_COMMUNITY)
Admission: RE | Admit: 2022-04-16 | Discharge: 2022-04-16 | Disposition: A | Discharge status: Home / Self Care | Payer: Medicare Other | Source: Ambulatory Visit | Attending: General Surgery | Admitting: General Surgery

## 2022-04-16 ENCOUNTER — Telehealth: Payer: Self-pay | Admitting: Cardiology

## 2022-04-16 VITALS — BP 134/69 | HR 74 | Temp 97.7°F | Resp 18 | Ht 64.0 in | Wt 230.0 lb

## 2022-04-16 DIAGNOSIS — K219 Gastro-esophageal reflux disease without esophagitis: Secondary | ICD-10-CM | POA: Diagnosis not present

## 2022-04-16 DIAGNOSIS — I700 Atherosclerosis of aorta: Secondary | ICD-10-CM | POA: Insufficient documentation

## 2022-04-16 DIAGNOSIS — K432 Incisional hernia without obstruction or gangrene: Secondary | ICD-10-CM | POA: Insufficient documentation

## 2022-04-16 DIAGNOSIS — I129 Hypertensive chronic kidney disease with stage 1 through stage 4 chronic kidney disease, or unspecified chronic kidney disease: Secondary | ICD-10-CM | POA: Insufficient documentation

## 2022-04-16 DIAGNOSIS — Z6839 Body mass index (BMI) 39.0-39.9, adult: Secondary | ICD-10-CM | POA: Insufficient documentation

## 2022-04-16 DIAGNOSIS — E669 Obesity, unspecified: Secondary | ICD-10-CM | POA: Diagnosis not present

## 2022-04-16 DIAGNOSIS — Z01812 Encounter for preprocedural laboratory examination: Secondary | ICD-10-CM | POA: Diagnosis not present

## 2022-04-16 DIAGNOSIS — I4719 Other supraventricular tachycardia: Secondary | ICD-10-CM | POA: Insufficient documentation

## 2022-04-16 DIAGNOSIS — G4733 Obstructive sleep apnea (adult) (pediatric): Secondary | ICD-10-CM | POA: Diagnosis not present

## 2022-04-16 DIAGNOSIS — I3481 Nonrheumatic mitral (valve) annulus calcification: Secondary | ICD-10-CM | POA: Diagnosis not present

## 2022-04-16 DIAGNOSIS — F909 Attention-deficit hyperactivity disorder, unspecified type: Secondary | ICD-10-CM | POA: Insufficient documentation

## 2022-04-16 DIAGNOSIS — I2510 Atherosclerotic heart disease of native coronary artery without angina pectoris: Secondary | ICD-10-CM | POA: Diagnosis not present

## 2022-04-16 DIAGNOSIS — Z905 Acquired absence of kidney: Secondary | ICD-10-CM | POA: Insufficient documentation

## 2022-04-16 DIAGNOSIS — E039 Hypothyroidism, unspecified: Secondary | ICD-10-CM | POA: Insufficient documentation

## 2022-04-16 DIAGNOSIS — K5730 Diverticulosis of large intestine without perforation or abscess without bleeding: Secondary | ICD-10-CM | POA: Diagnosis not present

## 2022-04-16 DIAGNOSIS — Z01818 Encounter for other preprocedural examination: Secondary | ICD-10-CM

## 2022-04-16 DIAGNOSIS — K760 Fatty (change of) liver, not elsewhere classified: Secondary | ICD-10-CM | POA: Insufficient documentation

## 2022-04-16 DIAGNOSIS — N189 Chronic kidney disease, unspecified: Secondary | ICD-10-CM | POA: Diagnosis not present

## 2022-04-16 DIAGNOSIS — I10 Essential (primary) hypertension: Secondary | ICD-10-CM

## 2022-04-16 HISTORY — DX: Fibromyalgia: M79.7

## 2022-04-16 HISTORY — DX: Unspecified osteoarthritis, unspecified site: M19.90

## 2022-04-16 HISTORY — DX: Prediabetes: R73.03

## 2022-04-16 LAB — COMPREHENSIVE METABOLIC PANEL
ALT: 22 U/L (ref 0–44)
AST: 23 U/L (ref 15–41)
Albumin: 3.9 g/dL (ref 3.5–5.0)
Alkaline Phosphatase: 68 U/L (ref 38–126)
Anion gap: 9 (ref 5–15)
BUN: 22 mg/dL (ref 8–23)
CO2: 28 mmol/L (ref 22–32)
Calcium: 9.6 mg/dL (ref 8.9–10.3)
Chloride: 102 mmol/L (ref 98–111)
Creatinine, Ser: 1.1 mg/dL — ABNORMAL HIGH (ref 0.44–1.00)
GFR, Estimated: 52 mL/min — ABNORMAL LOW (ref >60)
Glucose, Bld: 106 mg/dL — ABNORMAL HIGH (ref 70–99)
Potassium: 3.7 mmol/L (ref 3.5–5.1)
Sodium: 139 mmol/L (ref 135–145)
Total Bilirubin: 0.3 mg/dL (ref 0.3–1.2)
Total Protein: 6.9 g/dL (ref 6.5–8.1)

## 2022-04-16 LAB — CBC
HCT: 40.3 % (ref 36.0–46.0)
Hemoglobin: 13.3 g/dL (ref 12.0–15.0)
MCH: 28.1 pg (ref 26.0–34.0)
MCHC: 33 g/dL (ref 30.0–36.0)
MCV: 85 fL (ref 80.0–100.0)
Platelets: 251 10*3/uL (ref 150–400)
RBC: 4.74 MIL/uL (ref 3.87–5.11)
RDW: 14 % (ref 11.5–15.5)
WBC: 6.2 10*3/uL (ref 4.0–10.5)
nRBC: 0 % (ref 0.0–0.2)

## 2022-04-16 NOTE — Telephone Encounter (Signed)
   [facility] Pre-operative Risk Assessment    Request for surgical clearance:  What type of surgery is being performed?  Open Incisional Hernia Repair  When is this surgery scheduled?  04/21/22   What type of clearance is required (medical clearance vs. Pharmacy clearance to hold med vs. Both)?  Medical   Are there any medications that need to be held prior to surgery and how long? No    Practice name and name of physician performing surgery?  [facility]  Dr. Ralene Ok   What is your office phone number? [phone]    7.   What is your office fax number? [phone]  8.   Anesthesia type (None, local, MAC, general) ?  General    Zara Council 04/16/2022, 2:24 PM

## 2022-04-16 NOTE — Telephone Encounter (Signed)
   Name: [name]  DOB: 1945/08/31  MRN: [id number]  Primary Cardiologist: Buford Dresser, MD  Chart reviewed as part of pre-operative protocol coverage. Because of CIARAH PEACE past medical history and time since last visit, she will require a follow-up telephone visit in order to better assess preoperative cardiovascular risk.  Pre-op covering staff: - Please schedule appointment and call patient to inform them. If patient already had an upcoming appointment within acceptable timeframe, please add "pre-op clearance" to the appointment notes so provider is aware. - Please contact requesting surgeon's office via preferred method (i.e, phone, fax) to inform them of need for appointment prior to surgery.  She is not on any antiplatelet or anticoagulation medications.  Elgie Collard, PA-C  04/16/2022, 4:14 PM

## 2022-04-16 NOTE — Telephone Encounter (Signed)
Left message to call back to set up tele pre op appt.

## 2022-04-16 NOTE — Progress Notes (Signed)
PCP - Lona Kettle Cardiologist - bridgette christopher Pulmonologist: Elsworth Soho Neurology: Tomi Likens  PPM/ICD - denies  Chest x-ray - n/a EKG - 01/15/22 Stress Test - 07/23/20 ECHO - 01/21/22 Cardiac Cath - 2018  Sleep Study - +OSA CPAP - does not wear nightly  ERAS Protcol -yes PRE-SURGERY Ensure or G2- 2 ensures the night before surgery and 1 ensure the morning of surgery  COVID TEST- not needed   Anesthesia review: james burns, PA-C called during appt to review chart and see if she needed cardiac clearance. Bridgette christophers office notified for cardiac clearance. Patient complained of tooth ache during appt and stated she had a dentist appt on Monday.   Patient denies shortness of breath, fever, cough and chest pain at PAT appointment   All instructions explained to the patient, with a verbal understanding of the material. Patient agrees to go over the instructions while at home for a better understanding. Patient also instructed to self quarantine after being tested for COVID-19. The opportunity to ask questions was provided.

## 2022-04-16 NOTE — Telephone Encounter (Signed)
Called pateint and she is scheduled for the 26th

## 2022-04-19 NOTE — Telephone Encounter (Signed)
I called the pt back and left vm to call back to schedule a tele pre op appt.

## 2022-04-19 NOTE — Telephone Encounter (Signed)
2nd attempt to reach pt to set up tele pre op appt. I will send FYI to requesting office the pt has not returned our call to schedule tele visit.

## 2022-04-19 NOTE — Telephone Encounter (Signed)
Pt returning call

## 2022-04-19 NOTE — Progress Notes (Signed)
Anesthesia Chart Review:  Case: [id number] Date/Time: 04/21/22 1145   Procedure: OPEN INCISIONAL HERNIA REPAIR WITH MESH   Anesthesia type: General   Pre-op diagnosis: INCISIONAL HERNIA   Location: [place] OR ROOM 10 / [place] OR   Surgeons: Ralene Ok, MD       DISCUSSION: Patient is a 77 year old female scheduled for the above procedure.   History includes smoking, post-operative N/V, CAD, paroxysmal atrial tachycardia (s/p ablation 2000's), PVCs, HTN, HLD, OSA, GERD, hypothyroidism (partial thyroidectomy 1990'a), dyspnea, ADHD, PTSD, Bipolar I, migraines, right renal cancer clear cell type (s/p right  radical nephrectomy, repair of IVC 07/31/19), DIFFICULT INTUBATION (> 15 years ago in [place]; Glidescope and 4 used to place 7.0 mm ETT 07/31/19 given reported history). BMI is consistent with obesity.     Last cardiology office visit with Dr. Harrell Gave was on 10/20/20 for palpitations (with history of PAT s/p ablation) and HTN. Non-ischemic stress test in May 2022.  She was seen on 01/13/22 by Darcella Cheshire, NP for worsening HTN at home, anxiety possibly contributing.  BP stable at that visit, but was having a SI, so referred to the ED for psychiatric evaluation and was discharged home after clearance. She did have an echo on 01/21/22 that showed LVEF 55-606, no regional wall motion abnormalities, moderate LVH, normal PASP, mild-moderately dilated LA/RA, trivial MR. It appears a Zio monitor was ordered last year, but there are no results, so unclear if monitoring person was completed (some earlier notes suggest difficulty with pads sticking). Monitor in 02/2019 showed predominantly SR, 8 beat run of SVT, < 1% atrial/ventricular ectopy.  Dr. Rosendo Gros is requesting cardiology clearance. Appointment has not been scheduled yet, so OR scheduling indicated office is planning to postpone surgery.    VS: BP 134/69   Pulse 74   Temp 36.5 C (Oral)   Resp 18   Ht '5\' 4"'$  (1.626 m)   Wt 104.3 kg   SpO2 100%   BMI  39.48 kg/m    PROVIDERS: Lawerance Cruel, MD is PCP  Buford Dresser, MD is cardiologist Jacalyn Lefevre, MD is urologist  Metta Clines, DO is neurologist Curt Bears, MD is HEM-ONC Kara Mead, MD is pulmonologist   LABS: Labs reviewed: Acceptable for surgery. (all labs ordered are listed, but only abnormal results are displayed)  Labs Reviewed  COMPREHENSIVE METABOLIC PANEL - Abnormal; Notable for the following components:      Result Value   Glucose, Bld 106 (*)    Creatinine, Ser 1.10 (*)    GFR, Estimated 52 (*)    All other components within normal limits  CBC     IMAGES: CT Chest/Abd/Pelvis 01/28/22: IMPRESSION: 1. Status post right radical nephrectomy with no definitive findings to suggest locally recurrent disease or metastatic disease to the chest, abdomen or pelvis. 2. Moderate-sized ventral hernia containing loops of mid small bowel, without evidence of bowel incarceration or obstruction at this time. 3. Hepatic steatosis. 4. Cholelithiasis without evidence of acute cholecystitis at this time. 5. Colonic diverticulosis without evidence of acute diverticulitis. 6. Aortic atherosclerosis, in addition to left main and three-vessel coronary artery disease. Assessment for potential risk factor modification, dietary therapy or pharmacologic therapy may be warranted, if clinically indicated. 7. There are calcifications of the aortic valve and mitral annulus. Echocardiographic correlation for evaluation of potential valvular dysfunction may be warranted if clinically indicated. 8. Additional incidental findings, as above.   EKG: 01/13/2022: NSR   CV: Echo 01/21/2022 IMPRESSIONS   1. Left ventricular  ejection fraction, by estimation, is 55 to 60%. The  left ventricle has normal function. The left ventricle has no regional  wall motion abnormalities. There is moderate left ventricular hypertrophy.  Left ventricular diastolic  parameters are  indeterminate. The average left ventricular global  longitudinal strain is -18.0 %. The global longitudinal strain is normal.   2. Right ventricular systolic function is normal. The right ventricular  size is normal. There is normal pulmonary artery systolic pressure.   3. Left atrial size was mild to moderately dilated.   4. Right atrial size was mild to moderately dilated.   5. The mitral valve is grossly normal. Trivial mitral valve  regurgitation. No evidence of mitral stenosis.   6. The aortic valve is grossly normal. There is mild calcification of the  aortic valve. Aortic valve regurgitation is not visualized. Aortic valve  sclerosis is present, with no evidence of aortic valve stenosis.   7. The inferior vena cava is normal in size with greater than 50%  respiratory variability, suggesting right atrial pressure of 3 mmHg.  - Comparison(s): No significant change from prior study.  - Conclusion(s)/Recommendation(s): Otherwise normal echocardiogram, with  minor abnormalities described in the report.     Myocardial Perfusion 08/12/2020 The left ventricular ejection fraction is normal (55-65%). Nuclear stress EF: 63%. There was no ST segment deviation noted during stress. No T wave inversion was noted during stress. The study is normal. This is a low risk study.   1.  Normal study without evidence of ischemia or infarction. 2.  Normal left ventricular function, EF 63%. 3.  This is a low risk study.   7-day ZIO monitor 03/20/2019 Minimum heart rate of 43 bpm, maximal heart rate of 193 bpm, and an average heart rate of 66 bpm. Predominantly underlying rhythm was sinus rhythm. No VT, atrial fibrillation, high degree block, or pauses noted 1 true SVT noted, 8 beats at 193 bpm. 2 other events labeled as SVT are either sinus or difficult to determine. Isolated atrial and ventricular ectopy was rare, less than 1%. There were 42 triggered events, the vast majority of which were sinus  rhythm with or without brief ectopy. There is 1 significant artifact on many on the triggered events. There was one episode of ventricular trigeminy lasting approximately 10 seconds. No high risk findings.   Cardiac Cath 06/08/2016 50-70% stenosis in the mid LAD proximal to the first diagonal. FFR not performed because of difficulty with catheter control from the right radial.  75-80% stenosis in the midsegment of the first obtuse marginal.  Luminal irregularities in the mid RCA.  Overall normal LV function with EF percent.    Recommendations:  Aggressive risk factor modification and anti-ischemic therapy ( beta blocker, statin therapy and possibly LA nitrates) in this non-ACS subset. PCI in this setting does not meet appropriate use criteria.  If limiting symptoms on medications or convincing evidence of ischemia, consider obtuse marginal PCI and/or higher than usual risk PCI on the LAD (due to calcification and angulation).    US Carotid 04/22/2013 Summary:  Findings suggest 1-39% internal carotid artery stenosis  bilaterally. The left vertebral artery is patent with  antegrade flow. Unable to visualize the right vertebral  artery.    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 FFR of LAD  not performed because of difficulty with catheter control from the right radial.    Chronic cough 02/22/2020   Chronic kidney disease    hx of kidney cancer    Diastolic dysfunction, left ventricle 05/31/2013   Difficult intubation    told by [facility] that she was hard to intubate 15 yrs ago in [place]- surgery since then no problems   Dyspnea    Essential hypertension 05/31/2013   Fibromyalgia    Generalized anxiety disorder    Adequate for discharge    GERD (gastroesophageal reflux disease)    History of attention deficit disorder    History of echocardiogram 07/2012   Normal LVF w grade I siastolic dysfunction    History of endometriosis    History of gall stones 12/31/2009   History of pleural effusion    Hyperlipidemia 06/24/2016   LDL goal <70   Hypertension    Hypothyroidism 1990   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 Elsworth Soho discussed inspire device, patient would need to lose weight   PAT (paroxysmal atrial tachycardia)    s/p ablation   PONV (postoperative nausea and vomiting)    Pre-diabetes    PTSD (post-traumatic stress disorder)    Pure hypercholesterolemia 02/18/2019   PVC's (premature ventricular contractions)    Renal mass 02/15/2012   Restless legs syndrome (RLS) 02/20/2007   Vertigo     Past Surgical History:  Procedure Laterality Date   CARDIAC ELECTROPHYSIOLOGY MAPPING AND ABLATION  2000s   LAPAROSCOPIC NEPHRECTOMY, HAND ASSISTED Right 07/31/2019   Procedure: HAND ASSISTED LAPAROSCOPIC NEPHRECTOMY CONVERTED TO OPEN WITH REPAIR OF INFERIOR VENA CAVAOTOMY;  Surgeon: Robley Fries, MD;  Location: [facility];  Service: Urology;  Laterality: Right;  3 HRS   laparoscopy     LEFT HEART CATH AND CORONARY ANGIOGRAPHY N/A 06/08/2016   Procedure: Left Heart Cath and Coronary  Angiography;  Surgeon: Belva Crome, MD;  Location: [facility];  Service: Cardiovascular;  Laterality: N/A;   nasoseptal reconstruction  1990s   NEPHRECTOMY RECIPIENT Right 08/2019   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   [place]     as a child   TUBAL LIGATION  1970s   uterine mass removal  03/2012   was found to be benign    MEDICATIONS:  ABILIFY 10 MG tablet   acetaminophen (TYLENOL) 500 MG tablet   ARTIFICIAL TEAR OP   B Complex Vitamins (B COMPLEX PO)   busPIRone (BUSPAR) 10 MG tablet   Cholecalciferol (VITAMIN D3) 125 MCG (5000 UT) CAPS   diphenhydrAMINE (BENADRYL) 25 mg capsule   DULoxetine (CYMBALTA) 30 MG capsule   ipratropium (ATROVENT) 0.06 % nasal spray   levothyroxine (SYNTHROID) 88 MCG tablet  LORazepam (ATIVAN) 0.5 MG tablet   losartan-hydrochlorothiazide (HYZAAR) 50-12.5 MG tablet   Magnesium Oxide, Antacid, 500 MG CAPS   methylphenidate (RITALIN) 5 MG tablet   metoprolol tartrate (LOPRESSOR) 25 MG tablet   MINERAL ICE 2 % GEL   ondansetron (ZOFRAN-ODT) 8 MG disintegrating tablet   promethazine (PHENERGAN) 25 MG tablet   rOPINIRole (REQUIP) 0.5 MG tablet   rosuvastatin (CRESTOR) 10 MG tablet   rosuvastatin (CRESTOR) 5 MG tablet   UNABLE TO FIND   vitamin C (ASCORBIC ACID) 500 MG tablet   No current facility-administered medications for this encounter.    Myra Gianotti, PA-C Surgical Short Stay/Anesthesiology [facility] Phone [phone] [facility] Phone [phone] 04/19/2022 3:55 PM

## 2022-04-20 ENCOUNTER — Telehealth (HOSPITAL_BASED_OUTPATIENT_CLINIC_OR_DEPARTMENT_OTHER): Payer: Self-pay

## 2022-04-20 ENCOUNTER — Telehealth: Payer: Self-pay | Admitting: *Deleted

## 2022-04-20 NOTE — Telephone Encounter (Addendum)
Returned call to patient as requested below,  Patient states that she isn't sure if something is wrong with her blood pressure cuff or not. She states that she is living in a situation that is not good for, denies any danger, but very much anxiety and stress producing. She is actively looking for a new place.    155/111 this morning 140s/90s-150s/100s  She states that she does sometime take double of her cardiac medications because she is worried about her blood pressure levels, sometimes she will even take two extra losartan's. She states this done does bring her blood pressure down, the top about 20 points and bottom 10 points!    ----- Message from Michae Kava, [place] sent at 04/20/2022 10:35 AM EST ----- Regarding: BP HIGH I was speaking with the pt today about pre op appt, when the pt stated to me about her BP. Pt states she is having a hard time getting her BP down some.  States to BP was 155/111. I assured her that I will have Dr. Shawna Orleans Christopher's nurse call her about the BP to discuss further. Pt thanked me for the help today.   Thank you Arbie Cookey

## 2022-04-20 NOTE — Telephone Encounter (Signed)
Pt states her surgery has been post poned. We have set up tele pre op appt 04/26/22 @ 2:20. Med rec and consent are done.

## 2022-04-20 NOTE — Telephone Encounter (Signed)
Blood pressure at preop visit 134/69 which is good. Her blood pressure has been well controlled in the office. Last seen 12/2021. Would recommend follow up visit next week in person and to have her bring her blood pressure cuff. We could do her preop clearance at the same time if she wishes. If she prefers to leave her preop phone visit as scheduled that is also fine and we can just have a separate visit to address her BP.   Loel Dubonnet, NP

## 2022-04-20 NOTE — Telephone Encounter (Signed)
Left message for patient to call back     "Blood pressure at preop visit 134/69 which is good. Her blood pressure has been well controlled in the office. Last seen 12/2021. Would recommend follow up visit next week in person and to have her bring her blood pressure cuff. We could do her preop clearance at the same time if she wishes. If she prefers to leave her preop phone visit as scheduled that is also fine and we can just have a separate visit to address her BP.    Loel Dubonnet, NP "

## 2022-04-20 NOTE — Telephone Encounter (Signed)
Pt states her surgery has been post poned. We have set up tele pre op appt 04/26/22 @ 2:20. Med rec and consent are done.     Patient Consent for Virtual Visit        NEETU CARROZZA has provided verbal consent on 04/20/2022 for a virtual visit (video or telephone).   CONSENT FOR VIRTUAL VISIT FOR:  [name]  By participating in this virtual visit I agree to the following:  I hereby voluntarily request, consent and authorize [facility] and its employed or contracted physicians, physician assistants, nurse practitioners or other licensed health care professionals (the Practitioner), to provide me with telemedicine health care services (the "Services") as deemed necessary by the treating Practitioner. I acknowledge and consent to receive the Services by the Practitioner via telemedicine. I understand that the telemedicine visit will involve communicating with the Practitioner through live audiovisual communication technology and the disclosure of certain medical information by electronic transmission. I acknowledge that I have been given the opportunity to request an in-person assessment or other available alternative prior to the telemedicine visit and am voluntarily participating in the telemedicine visit.  I understand that I have the right to withhold or withdraw my consent to the use of telemedicine in the course of my care at any time, without affecting my right to future care or treatment, and that the Practitioner or I may terminate the telemedicine visit at any time. I understand that I have the right to inspect all information obtained and/or recorded in the course of the telemedicine visit and may receive copies of available information for a reasonable fee.  I understand that some of the potential risks of receiving the Services via telemedicine include:  Delay or interruption in medical evaluation due to technological equipment failure or disruption; Information  transmitted may not be sufficient (e.g. poor resolution of images) to allow for appropriate medical decision making by the Practitioner; and/or  In rare instances, security protocols could fail, causing a breach of personal health information.  Furthermore, I acknowledge that it is my responsibility to provide information about my medical history, conditions and care that is complete and accurate to the best of my ability. I acknowledge that Practitioner's advice, recommendations, and/or decision may be based on factors not within their control, such as incomplete or inaccurate data provided by me or distortions of diagnostic images or specimens that may result from electronic transmissions. I understand that the practice of medicine is not an exact science and that Practitioner makes no warranties or guarantees regarding treatment outcomes. I acknowledge that a copy of this consent can be made available to me via my patient portal ([facility]), or I can request a printed copy by calling the office of [facility].    I understand that my insurance will be billed for this visit.   I have read or had this consent read to me. I understand the contents of this consent, which adequately explains the benefits and risks of the Services being provided via telemedicine.  I have been provided ample opportunity to ask questions regarding this consent and the Services and have had my questions answered to my satisfaction. I give my informed consent for the services to be provided through the use of telemedicine in my medical care

## 2022-04-21 ENCOUNTER — Ambulatory Visit (HOSPITAL_COMMUNITY): Admission: RE | Admit: 2022-04-21 | Payer: Medicare Other | Source: Home / Self Care | Admitting: General Surgery

## 2022-04-21 ENCOUNTER — Encounter (HOSPITAL_COMMUNITY): Admission: RE | Payer: Self-pay | Source: Home / Self Care

## 2022-04-21 SURGERY — REPAIR, HERNIA, INCISIONAL
Anesthesia: General

## 2022-04-22 NOTE — Telephone Encounter (Signed)
2nd call attempt, no answer, Left message for patient to call back         "Blood pressure at preop visit 134/69 which is good. Her blood pressure has been well controlled in the office. Last seen 12/2021. Would recommend follow up visit next week in person and to have her bring her blood pressure cuff. We could do her preop clearance at the same time if she wishes. If she prefers to leave her preop phone visit as scheduled that is also fine and we can just have a separate visit to address her BP.    Loel Dubonnet, NP "

## 2022-04-23 ENCOUNTER — Encounter (HOSPITAL_BASED_OUTPATIENT_CLINIC_OR_DEPARTMENT_OTHER): Payer: Self-pay

## 2022-04-23 DIAGNOSIS — K5909 Other constipation: Secondary | ICD-10-CM | POA: Diagnosis not present

## 2022-04-23 NOTE — Telephone Encounter (Signed)
3rd call attempt, no answer, Left message for patient to call back, will mail recommendations to patient. Removing from triage pool at this time.          "Blood pressure at preop visit 134/69 which is good. Her blood pressure has been well controlled in the office. Last seen 12/2021. Would recommend follow up visit next week in person and to have her bring her blood pressure cuff. We could do her preop clearance at the same time if she wishes. If she prefers to leave her preop phone visit as scheduled that is also fine and we can just have a separate visit to address her BP.    Loel Dubonnet, NP "

## 2022-04-26 ENCOUNTER — Ambulatory Visit: Payer: Medicare Other

## 2022-04-26 NOTE — Telephone Encounter (Signed)
Message forwarded to this RN stated patient was "upset" because per notes she needed an in person preop visit instead of virtual (as scheduled for today) to check her Bp.  Pt stated she wanted to speak with a manager so message was sent to this RN.  After reviewing notes and discussing with Diona Browner NP it is probably a better option for patient to be seen for Bp and have her preop in one face to face visit. This will avoid patient being financially responsible for 2 visits. Attempted to contact pt x2, no answer received and no identifier on voicemail. No message left. Pt is aware of her in person visit this month. Georgana Curio MHA RN CCM

## 2022-04-26 NOTE — Telephone Encounter (Signed)
I left a vm per the pre op app and Dr. Buford Dresser the pt needs in office appt now and not tele appt. I left vm that I am cancelling the tele appt for today and she needs to call back to schedule in office appt for pre op clearance and BP f/u.

## 2022-04-26 NOTE — Telephone Encounter (Signed)
Patient scheduled for preop clearance appt with Owens Shark on 2/13.  Patient only wanted to go to [facility] location.

## 2022-04-27 NOTE — Telephone Encounter (Signed)
Third attempt to contact patient with no answer. Georgana Curio RN.

## 2022-05-04 ENCOUNTER — Ambulatory Visit (HOSPITAL_BASED_OUTPATIENT_CLINIC_OR_DEPARTMENT_OTHER): Payer: Medicare Other | Admitting: Family

## 2022-05-04 NOTE — Progress Notes (Deleted)
Office Visit    Patient Name: [name] Date of Encounter: 05/04/2022  PCP:  Lawerance Cruel, [place]  Cardiologist:  Buford Dresser, MD  Advanced Practice Provider:  No care team member to display Electrophysiologist:  None      Chief Complaint    TOSCA BRANDIS is a 77 y.o. 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 FFR of LAD  not performed because of difficulty with catheter control from the right radial.    Chronic cough 02/22/2020   Chronic kidney disease    hx of kidney cancer   Diastolic dysfunction, left ventricle 05/31/2013   Difficult intubation    told by [facility] that she was hard to intubate 15 yrs ago in [place]- surgery since then no problems   Dyspnea    Essential hypertension 05/31/2013   Fibromyalgia    Generalized anxiety disorder    Adequate for discharge    GERD (gastroesophageal reflux disease)    History of attention deficit disorder    History of echocardiogram 07/2012   Normal LVF w grade I siastolic dysfunction    History of endometriosis    History of gall stones 12/31/2009   History of pleural effusion    Hyperlipidemia 06/24/2016   LDL goal <70   Hypertension    Hypothyroidism 1990   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 Elsworth Soho discussed inspire device, patient would need to lose weight   PAT (paroxysmal atrial tachycardia)    s/p ablation   PONV (postoperative nausea and vomiting)    Pre-diabetes    PTSD (post-traumatic stress disorder)    Pure hypercholesterolemia 02/18/2019   PVC's (premature ventricular contractions)    Renal mass 02/15/2012   Restless legs syndrome (RLS) 02/20/2007   Vertigo    Past Surgical History:  Procedure Laterality Date   CARDIAC ELECTROPHYSIOLOGY MAPPING AND ABLATION  2000s   LAPAROSCOPIC NEPHRECTOMY, HAND ASSISTED Right 07/31/2019   Procedure: HAND ASSISTED LAPAROSCOPIC NEPHRECTOMY CONVERTED TO OPEN WITH REPAIR OF INFERIOR VENA CAVAOTOMY;  Surgeon: Robley Fries, MD;  Location: [facility];  Service: Urology;  Laterality: Right;  3 HRS   laparoscopy     LEFT HEART CATH AND CORONARY ANGIOGRAPHY N/A 06/08/2016   Procedure: Left Heart Cath and Coronary Angiography;  Surgeon: Belva Crome, MD;  Location: [facility];  Service: Cardiovascular;  Laterality: N/A;   nasoseptal reconstruction  1990s  NEPHRECTOMY RECIPIENT Right 08/2019   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   [place]     as a child   TUBAL LIGATION  1970s   uterine mass removal  03/2012   was found to be benign    Allergies  Allergies  Allergen Reactions   Geodon [Ziprasidone Hydrochloride] Other (See Comments)    Extremely agitated   Lithium Nausea Only and Other (See Comments)    Off balance, increased heart rate   Talwin [Pentazocine] Other (See Comments)    Hallucinations    Toradol [Ketorolac Tromethamine] Other (See Comments)    Chest pains   Abilify [Aripiprazole] Other (See  Comments)    jerking   Compazine [Prochlorperazine Edisylate] Nausea And Vomiting   Latuda [Lurasidone Hcl] Other (See Comments)    Reports made her mind race more and made her irritable    Pristiq [Desvenlafaxine Succinate Er] Other (See Comments)    Did not work, prefers not to take   Rexulti [Brexpiprazole] Other (See Comments) and Hypertension    Elevated BP, created aggression   Diflucan [Fluconazole] Nausea And Vomiting    History of Present Illness    [name] is a 77 y.o. female with a hx of *** last seen ***.   Presents here for today for preoperative clearance for open incisional hernia repair by Dr. Rosendo Gros.   EKGs/Labs/Other Studies Reviewed:   The following studies were reviewed today: ***  EKG:  EKG is *** ordered today.  The ekg ordered today demonstrates ***  Recent Labs: 10/20/2021: TSH 0.451 04/16/2022: ALT 22; BUN 22; Creatinine, Ser 1.10; Hemoglobin 13.3; Platelets 251; Potassium 3.7; Sodium 139  Recent Lipid Panel    Component Value Date/Time   CHOL 177 05/26/2018 0625   TRIG 68 05/26/2018 0625   HDL 47 05/26/2018 0625   CHOLHDL 3.8 05/26/2018 0625   VLDL 14 05/26/2018 0625   LDLCALC 116 (H) 05/26/2018 0625    Risk Assessment/Calculations:  {Does this patient have ATRIAL FIBRILLATION?:[phone]}  Home Medications   No outpatient medications have been marked as taking for the 05/04/22 encounter (Appointment) with Loel Dubonnet, NP.     Review of Systems   ***   All other systems reviewed and are otherwise negative except as noted above.  Physical Exam    VS:  There were no vitals taken for this visit. , BMI There is no height or weight on file to calculate BMI.  Wt Readings from Last 3 Encounters:  04/16/22 230 lb (104.3 kg)  01/13/22 232 lb (105.2 kg)  12/29/21 238 lb 6.4 oz (108.1 kg)     GEN: Well nourished, well developed, in no acute distress. HEENT: normal. Neck: Supple, no JVD, carotid bruits, or masses. Cardiac:  ***RRR, no murmurs, rubs, or gallops. No clubbing, cyanosis, edema.  ***Radials/PT 2+ and equal bilaterally.  Respiratory:  ***Respirations regular and unlabored, clear to auscultation bilaterally. GI: Soft, nontender, nondistended. MS: No deformity or atrophy. Skin: Warm and dry, no rash. Neuro:  Frank and sensation are intact. Psych: Normal affect.  Assessment & Plan    ***  No BP recorded.  {Refresh Note OR Click here to enter BP  :1}***      Disposition: Follow up {follow up:15908} with Buford Dresser, MD or APP.  Signed, Loel Dubonnet, NP 05/04/2022, 1:52 PM  [facility]

## 2022-05-05 ENCOUNTER — Ambulatory Visit: Payer: Medicare Other | Admitting: Neurology

## 2022-05-05 DIAGNOSIS — L989 Disorder of the skin and subcutaneous tissue, unspecified: Secondary | ICD-10-CM | POA: Diagnosis not present

## 2022-05-06 DIAGNOSIS — K0889 Other specified disorders of teeth and supporting structures: Secondary | ICD-10-CM | POA: Diagnosis not present

## 2022-05-06 DIAGNOSIS — K047 Periapical abscess without sinus: Secondary | ICD-10-CM | POA: Diagnosis not present

## 2022-05-07 ENCOUNTER — Ambulatory Visit (HOSPITAL_BASED_OUTPATIENT_CLINIC_OR_DEPARTMENT_OTHER): Payer: Medicare Other | Admitting: Family

## 2022-05-11 DIAGNOSIS — M1711 Unilateral primary osteoarthritis, right knee: Secondary | ICD-10-CM | POA: Diagnosis not present

## 2022-05-11 DIAGNOSIS — M25562 Pain in left knee: Secondary | ICD-10-CM | POA: Diagnosis not present

## 2022-05-12 ENCOUNTER — Other Ambulatory Visit: Payer: Self-pay

## 2022-05-12 DIAGNOSIS — C641 Malignant neoplasm of right kidney, except renal pelvis: Secondary | ICD-10-CM

## 2022-05-14 ENCOUNTER — Encounter (HOSPITAL_BASED_OUTPATIENT_CLINIC_OR_DEPARTMENT_OTHER): Payer: Self-pay | Admitting: Family

## 2022-05-14 ENCOUNTER — Ambulatory Visit (HOSPITAL_BASED_OUTPATIENT_CLINIC_OR_DEPARTMENT_OTHER): Payer: Medicare Other | Admitting: Family

## 2022-05-14 VITALS — BP 126/82 | HR 56 | Ht 64.0 in | Wt 229.0 lb

## 2022-05-14 DIAGNOSIS — I10 Essential (primary) hypertension: Secondary | ICD-10-CM

## 2022-05-14 DIAGNOSIS — E785 Hyperlipidemia, unspecified: Secondary | ICD-10-CM | POA: Diagnosis not present

## 2022-05-14 DIAGNOSIS — Z01818 Encounter for other preprocedural examination: Secondary | ICD-10-CM | POA: Diagnosis not present

## 2022-05-14 DIAGNOSIS — I25118 Atherosclerotic heart disease of native coronary artery with other forms of angina pectoris: Secondary | ICD-10-CM

## 2022-05-14 MED ORDER — EZETIMIBE 10 MG PO TABS: 10.0000 mg | ORAL_TABLET | Freq: Every day | ORAL | 3 refills | Status: DC

## 2022-05-14 NOTE — Progress Notes (Signed)
Office Visit    Patient Name: [name] Date of Encounter: 05/14/2022  PCP:  Lawerance Cruel, [place]  Cardiologist:  Buford Dresser, MD  Advanced Practice Provider:  No care team member to display Electrophysiologist:  None      Chief Complaint    [name] is a 77 y.o. 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 FFR of LAD  not performed because of difficulty with catheter control from the right radial.    Chronic cough 02/22/2020   Chronic kidney disease    hx of kidney cancer   Diastolic dysfunction, left ventricle 05/31/2013   Difficult intubation    told by [employer] that she was hard to intubate 15 yrs ago in [place]- surgery since then no problems   Dyspnea    Essential hypertension 05/31/2013   Fibromyalgia    Generalized anxiety disorder    Adequate for discharge    GERD (gastroesophageal reflux disease)    History of attention deficit disorder    History of echocardiogram 07/2012   Normal LVF w grade I siastolic dysfunction    History of endometriosis    History of gall stones 12/31/2009   History of pleural effusion    Hyperlipidemia 06/24/2016   LDL goal <70   Hypertension    Hypothyroidism 1990   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 Elsworth Soho discussed inspire device, patient would need to lose weight   PAT (paroxysmal atrial tachycardia)    s/p ablation   PONV (postoperative nausea and vomiting)    Pre-diabetes    PTSD (post-traumatic stress disorder)    Pure hypercholesterolemia 02/18/2019   PVC's (premature ventricular contractions)    Renal mass 02/15/2012   Restless legs syndrome (RLS) 02/20/2007   Vertigo    Past Surgical History:  Procedure Laterality Date   CARDIAC ELECTROPHYSIOLOGY MAPPING AND ABLATION  2000s   LAPAROSCOPIC NEPHRECTOMY, HAND ASSISTED Right 07/31/2019   Procedure: HAND ASSISTED LAPAROSCOPIC NEPHRECTOMY CONVERTED TO OPEN WITH REPAIR OF INFERIOR VENA CAVAOTOMY;  Surgeon: Robley Fries, MD;  Location: [facility];  Service: Urology;  Laterality: Right;  3 HRS   laparoscopy     LEFT HEART CATH AND CORONARY ANGIOGRAPHY N/A 06/08/2016   Procedure: Left Heart Cath and Coronary Angiography;  Surgeon: Belva Crome, MD;  Location: [facility];  Service: Cardiovascular;  Laterality: N/A;   nasoseptal reconstruction  1990s   NEPHRECTOMY  RECIPIENT Right 08/2019   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   [place]     as a child   TUBAL LIGATION  1970s   uterine mass removal  03/2012   was found to be benign    Allergies  Allergies  Allergen Reactions   Geodon [Ziprasidone Hydrochloride] Other (See Comments)    Extremely agitated   Lithium Nausea Only and Other (See Comments)    Off balance, increased heart rate   Talwin [Pentazocine] Other (See Comments)    Hallucinations    Toradol [Ketorolac Tromethamine] Other (See Comments)    Chest pains   Abilify [Aripiprazole] Other (See  Comments)    jerking   Compazine [Prochlorperazine Edisylate] Nausea And Vomiting   Latuda [Lurasidone Hcl] Other (See Comments)    Reports made her mind race more and made her irritable    Pristiq [Desvenlafaxine Succinate Er] Other (See Comments)    Did not work, prefers not to take   Rexulti [Brexpiprazole] Other (See Comments) and Hypertension    Elevated BP, created aggression   Diflucan [Fluconazole] Nausea And Vomiting    History of Present Illness    [name] is a 77 y.o. female with a hx of hypertension, CAD, hyperlipidemia, PAD, PVC, OSA, GERD, hypothyroidism, CKD, renal cell carcinoma s/p nephrectomy, GAD, ADHD last seen 01/13/2022.  [place] 2018 with nonobstructive disease recommended for medical management.  Myoview 07/2020 low risk study.  Echo 09/2021 predominantly normal sinus rhythm with brief episodes of SVT.  Echo November 2023 normal LVEF 55 to 60%, no RWMA, moderate LVH, normal PASP, bilateral atria mildly to moderately dilated, trivial MR, aortic sclerosis without stenosis.  Presents today for preoperative clearance for open incisional hernia repair.  She has been doing overall well since last seen. She notes her blood pressure at home is running persistently elevated however her cuff was found to read 40 points high in clinic today.  Blood pressure well-controlled in clinic.  She is taking losartan-HCTZ twice per day as prescribed.  She takes her metoprolol twice per day as prescribed but will hold a dose of heart rate less than 60 bpm. SHe is currently on Amoxicillin for a tooth abscess.   Reports no shortness of breath nor dyspnea on exertion. Reports no chest pain, pressure, or tightness. No edema, orthopnea, PND. Reports no palpitations.    EKGs/Labs/Other Studies Reviewed:   The following studies were reviewed today:  Echo 01/21/22   1. Left ventricular ejection fraction, by estimation, is 55 to 60%. The  left ventricle has normal function. The left  ventricle has no regional  wall motion abnormalities. There is moderate left ventricular hypertrophy.  Left ventricular diastolic  parameters are indeterminate. The average left ventricular global  longitudinal strain is -18.0 %. The global longitudinal strain is normal.   2. Right ventricular systolic function is normal. The right ventricular  size is normal. There is normal pulmonary artery systolic pressure.   3. Left atrial size was mild to moderately dilated.   4. Right atrial size was mild to moderately dilated.   5. The mitral valve is grossly normal. Trivial mitral valve  regurgitation. No evidence of mitral stenosis.   6. The aortic valve is grossly normal. There is mild calcification of the  aortic valve. Aortic valve regurgitation is not visualized. Aortic valve  sclerosis is present, with no evidence of aortic valve stenosis.   7. The inferior vena cava is normal in size with greater  than 50%  respiratory variability, suggesting right atrial pressure of 3 mmHg.  EKG:  EKG is  ordered today.  The ekg ordered today demonstrates SB 56 bpm with no acute ST/T wave changes.  Recent Labs: 10/20/2021: TSH 0.451 04/16/2022: ALT 22; BUN 22; Creatinine, Ser 1.10; Hemoglobin 13.3; Platelets 251; Potassium 3.7; Sodium 139  Recent Lipid Panel    Component Value Date/Time   CHOL 177 05/26/2018 0625   TRIG 68 05/26/2018 0625   HDL 47 05/26/2018 0625   CHOLHDL 3.8 05/26/2018 0625   VLDL 14 05/26/2018 0625   LDLCALC 116 (H) 05/26/2018 0625      Home Medications   Current Meds  Medication Sig   acetaminophen (TYLENOL) 500 MG tablet Take 1,000 mg by mouth 2 (two) times daily as needed for mild pain (or headaches).   ARTIFICIAL TEAR OP Place 2 drops into both eyes daily as needed (dryness).   B Complex Vitamins (B COMPLEX PO) Take 20 drops by mouth daily.   Cholecalciferol (VITAMIN D3) 125 MCG (5000 UT) CAPS Take 5,000 Units by mouth in the morning.   diphenhydrAMINE (BENADRYL) 25 mg  capsule Take 25 mg by mouth as needed for allergies.   DULoxetine (CYMBALTA) 30 MG capsule Take 30 mg by mouth in the morning, at noon, and at bedtime.   ipratropium (ATROVENT) 0.06 % nasal spray Place 2 sprays into both nostrils as needed for rhinitis.   levothyroxine (SYNTHROID) 88 MCG tablet Take 88 mcg by mouth every morning.   losartan-hydrochlorothiazide (HYZAAR) 50-12.5 MG tablet TAKE 1 TABLET BY MOUTH TWICE A DAY   Magnesium Oxide, Antacid, 500 MG CAPS Take 500 mg by mouth daily.   methylphenidate (RITALIN) 5 MG tablet Take 5-7.5 mg by mouth 2 (two) times daily.   metoprolol tartrate (LOPRESSOR) 25 MG tablet Take 25 mg by mouth 2 (two) times daily.   MINERAL ICE 2 % GEL Apply 1 application  topically daily.   ondansetron (ZOFRAN-ODT) 8 MG disintegrating tablet Take 8 mg by mouth as needed for nausea or vomiting.   promethazine (PHENERGAN) 25 MG tablet Take 25 mg by mouth as needed for nausea or vomiting.   rOPINIRole (REQUIP) 0.5 MG tablet TAKE 1 TABLET BY MOUTH EVERY DAY AFTER SUPPER AND TAKE 2 TABLETS AT BEDTIME (Patient taking differently: Take 1 mg by mouth at bedtime.)   rosuvastatin (CRESTOR) 10 MG tablet Take 10 mg by mouth daily.   UNABLE TO FIND Take 40 mg by mouth at bedtime. Med Name: Delta 8   vitamin C (ASCORBIC ACID) 500 MG tablet Take 500 mg by mouth daily.     Review of Systems      All other systems reviewed and are otherwise negative except as noted above.  Physical Exam    VS:  BP 126/82   Pulse (!) 56   Ht '5\' 4"'$  (1.626 m)   Wt 229 lb (103.9 kg)   BMI 39.31 kg/m  , BMI Body mass index is 39.31 kg/m.  Wt Readings from Last 3 Encounters:  05/14/22 229 lb (103.9 kg)  04/16/22 230 lb (104.3 kg)  01/13/22 232 lb (105.2 kg)     GEN: Well nourished, well developed, in no acute distress. HEENT: normal. Neck: Supple, no JVD, carotid bruits, or masses. Cardiac: RRR, no murmurs, rubs, or gallops. No clubbing, cyanosis, edema.  Radials/PT 2+ and equal  bilaterally.  Respiratory:  Respirations regular and unlabored, clear to auscultation bilaterally. GI: Soft, nontender, nondistended. MS: No deformity or atrophy. Skin: Warm  and dry, no rash. Neuro:  Strength and sensation are intact. Psych: Normal affect.  Assessment & Plan    Preop clearance - Pending hiatal hernia repair. According to the Revised Cardiac Risk Index (RCRI), her Perioperative Risk of Major Cardiac Event is (%): 0.9. Her Functional Capacity in METs is: 6.05 according to the [employer] Activity Status Index (DASI). Per AHA/ACC guidelines, she is deemed acceptable risk for the planned procedure without additional cardiovascular testing. Will route to surgical team so they are aware.   Stop fish oil and vitamin E 5-7 days prior to surgery.  CAD / HLD - Prior LHC 2018 recommended for medical managaement. Stable with no anginal symptoms. No indication for ischemic evaluation.  GDMT Rosuvastatin ('10mg'$  and '5mg'$  QOD), MEtoprolol tartrate '25mg'$  BID. Add Zetia '10mg'$  QD to get LDL to goal of <70. Has f/u with PCP in April and anticipate labs will be collected at that time.  Palpitations - Quiescent on present dose Metoprolol.   Depression - Follows with psychology and counseling.   HTN - BP well controlled. Continue current antihypertensive regimen.          Disposition: Follow up in 6 month(s) with Buford Dresser, MD or APP.  Signed, Loel Dubonnet, NP 05/14/2022, 11:11 AM [place]

## 2022-05-14 NOTE — Patient Instructions (Signed)
Medication Instructions:  Your physician has recommended you make the following change in your medication:   START Zetia '10mg'$  once per day This works with your Rosuvastatin to help lower your cholesterol  CONTINUE Rosuvastatin (Crestor) alternate '10mg'$  and '5mg'$  every other day  Can use Meclizine '25mg'$  over the counter up to three times per day for dizziness like spinning  Recommend holding your Fish Oil and Vitamin E for 5-7 days prior to your surgery.  *If you need a refill on your cardiac medications before your next appointment, please call your pharmacy*  Testing/Procedures: Your EKG today looked great!   Follow-Up: At [facility], you and your health needs are our priority.  As part of our continuing mission to provide you with exceptional heart care, we have created designated Provider Care Teams.  These Care Teams include your primary Cardiologist (physician) and Advanced Practice Providers (APPs -  Physician Assistants and Nurse Practitioners) who all work together to provide you with the care you need, when you need it.  We recommend signing up for the patient portal called "MyChart".  Sign up information is provided on this After Visit Summary.  MyChart is used to connect with patients for Virtual Visits (Telemedicine).  Patients are able to view lab/test results, encounter notes, upcoming appointments, etc.  Non-urgent messages can be sent to your provider as well.   To learn more about what you can do with MyChart, go to [email or web address].    Your next appointment:   6 month(s)  Provider:   Buford Dresser, MD or Laurann Montana, NP    Other Instructions  Loel Dubonnet, NP will send a note to Dr. Rosendo Gros that you are cleared for your surgery.

## 2022-05-16 NOTE — Progress Notes (Deleted)
NEUROLOGY FOLLOW UP OFFICE NOTE  [name] [id number]  Assessment/Plan:   Major depressive disorder - this has been a cause for multiple somatic symptoms such as numbness, balance problems, weakness, and subjective memory complaints.  She will continue working with her psychiatrist and therapist.       Subjective:  [name] is a 77 year old female with CAD, HTN, hypothyroidism, anxiety, PTSD and CKD with history of kidney cancer who follows up for weakness.  UPDATE: ****  HISTORY: Patient has history of vertigo.  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  She has history of reported brain fog and short term memory problems since at least 2011.  MRI of brain with and without contrast on 02/12/2010 to assess confusion was personally reviewed and was unremarkable, as was a subsequent MRI of brain without contrast, personally reviewed, from 04/20/2013.  Neuropsychological evaluation on 08/22/2020 demonstrated some isolated impairments but overall unremarkable with findings likely related to her psychiatric comorbidities (ADHD, depression, anxiety) rather than underlying neurocognitive disorder.  She reports balance problems over the past year.  She feels weak in the legs.  However, she has history of chronic pain in the joints, such as the hips and knees.  She has arthritis in the right knee. Since early 2022, she reports numbness and tingling as well as pain in the feet and tingling and numbness in the hands.  She has acquired hypothyroidism which is treated and monitored.  TSH from June 2022 was 1.485.  She has low back pain and aching in the legs.  Denies neck pain.  Over the summer 2022, it looked like that she may have had mild facial asymmetry, with slight droop on the right.  MRI of brain without contrast on 11/05/2020 personally reviewed showed no acute intracranial abnormality.  It has since resolved.  She reports one other episode in the  past.  She was concerned about her balance and leg weakness because her sister, who has MS, has similar symptoms.  At that time, underwent workup for neuropathy.  B12 on 01/15/2021 was 938.  NCV-EMG on 03/03/2021 was normal.  She continued to not feel well.  She continues to feel weak.  She has chronic pain.  She was hospitalized due to worsening depression.  Continues to be uncontrolled.      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 FFR of LAD  not performed because of difficulty with catheter control from the right radial.    Chronic cough 02/22/2020   Chronic kidney disease    hx of kidney cancer   Diastolic dysfunction, left ventricle 05/31/2013   Difficult intubation    told by [facility] that she was hard to intubate 15 yrs ago in [place]- surgery since then no problems   Dyspnea    Essential hypertension 05/31/2013   Fibromyalgia    Generalized anxiety disorder  Adequate for discharge    GERD (gastroesophageal reflux disease)    History of attention deficit disorder    History of echocardiogram 07/2012   Normal LVF w grade I siastolic dysfunction    History of endometriosis    History of gall stones 12/31/2009   History of pleural effusion    Hyperlipidemia 06/24/2016   LDL goal <70   Hypertension    Hypothyroidism 1990   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 Elsworth Soho discussed inspire device, patient would need to lose weight   PAT (paroxysmal atrial tachycardia)    s/p ablation   PONV (postoperative nausea and vomiting)    Pre-diabetes    PTSD (post-traumatic stress disorder)    Pure hypercholesterolemia 02/18/2019   PVC's (premature ventricular contractions)    Renal mass 02/15/2012   Restless legs syndrome (RLS) 02/20/2007   Vertigo     MEDICATIONS: Current Outpatient Medications on File Prior to Visit  Medication Sig Dispense Refill   acetaminophen (TYLENOL) 500 MG tablet Take 1,000 mg by mouth 2 (two) times daily as needed for mild pain (or headaches).     ARTIFICIAL TEAR OP Place 2 drops into both eyes daily as needed (dryness).     B Complex Vitamins (B COMPLEX PO) Take 20 drops by mouth daily.     Cholecalciferol (VITAMIN D3) 125 MCG (5000 UT) CAPS Take 5,000 Units by mouth in the morning.     diphenhydrAMINE (BENADRYL) 25 mg capsule Take 25 mg by mouth as needed for allergies.     DULoxetine (CYMBALTA) 30 MG capsule Take 30 mg by mouth in the morning, at noon, and at bedtime.     ezetimibe (ZETIA) 10 MG tablet Take 1 tablet (10 mg total) by mouth daily. 90 tablet 3   ipratropium (ATROVENT) 0.06 % nasal spray Place 2 sprays into both nostrils as needed for rhinitis.     levothyroxine (SYNTHROID) 88 MCG tablet Take 88 mcg by mouth every morning.     losartan-hydrochlorothiazide (HYZAAR) 50-12.5 MG tablet TAKE 1 TABLET BY MOUTH TWICE A DAY 90 tablet 1   Magnesium Oxide, Antacid, 500 MG CAPS Take 500 mg by mouth daily.     methylphenidate (RITALIN) 5 MG tablet Take 5-7.5 mg by mouth 2 (two) times daily.     metoprolol tartrate (LOPRESSOR) 25 MG  tablet Take 25 mg by mouth 2 (two) times daily.     MINERAL ICE 2 % GEL Apply 1 application  topically daily.     ondansetron (ZOFRAN-ODT) 8 MG disintegrating tablet Take 8 mg by mouth as needed for nausea or vomiting.     promethazine (PHENERGAN) 25 MG tablet Take 25 mg by mouth as needed for nausea or vomiting.     rOPINIRole (REQUIP) 0.5 MG tablet TAKE 1 TABLET BY MOUTH EVERY DAY AFTER SUPPER AND TAKE 2 TABLETS AT BEDTIME (Patient taking differently: Take 1 mg by mouth at bedtime.) 90 tablet 10   rosuvastatin (CRESTOR) 10 MG tablet Take 10 mg by mouth daily.     rosuvastatin (CRESTOR) 5 MG tablet Take 1 tablet (5 mg total) by mouth daily. (Patient  not taking: Reported on 04/16/2022) 90 tablet 3   UNABLE TO FIND Take 40 mg by mouth at bedtime. Med Name: Delta 8     vitamin C (ASCORBIC ACID) 500 MG tablet Take 500 mg by mouth daily.     No current facility-administered medications on file prior to visit.    ALLERGIES: Allergies  Allergen Reactions   Geodon [Ziprasidone Hydrochloride] Other (See Comments)    Extremely agitated   Lithium Nausea Only and Other (See Comments)    Off balance, increased heart rate   Talwin [Pentazocine] Other (See Comments)    Hallucinations    Toradol [Ketorolac Tromethamine] Other (See Comments)    Chest pains   Abilify [Aripiprazole] Other (See Comments)    jerking   Compazine [Prochlorperazine Edisylate] Nausea And Vomiting   Latuda [Lurasidone Hcl] Other (See Comments)    Reports made her mind race more and made her irritable    Pristiq [Desvenlafaxine Succinate Er] Other (See Comments)    Did not work, prefers not to take   Rexulti [Brexpiprazole] Other (See Comments) and Hypertension    Elevated BP, created aggression   Diflucan [Fluconazole] Nausea And Vomiting    FAMILY HISTORY: Family History  Problem Relation Age of Onset   Allergies Father    Skin cancer Father    Alcohol abuse Father    Mental illness Father    Heart disease Mother     Depression Mother    Hypertension Mother    CAD Mother    Colon cancer Paternal Grandmother    OCD Paternal Grandmother    Multiple sclerosis Sister    Autoimmune disease Brother        unknown type   Allergies Daughter    Heart disease Maternal Grandfather    Cancer Paternal Grandfather    Parkinson's disease Paternal Grandfather    Mental illness Maternal Grandmother       Objective:  *** General: No acute distress.  Patient appears well-groomed.   Head:  Normocephalic/atraumatic Eyes:  Fundi examined but not visualized Heart:  Regular rate and rhythm Neurological Exam: ***   Metta Clines, DO  CC: C.Melinda Crutch, MD

## 2022-05-17 ENCOUNTER — Ambulatory Visit: Payer: Medicare Other | Admitting: Neurology

## 2022-05-17 ENCOUNTER — Other Ambulatory Visit: Payer: Self-pay | Admitting: Pulmonary Disease

## 2022-05-17 ENCOUNTER — Encounter: Payer: Self-pay | Admitting: Neurology

## 2022-05-17 DIAGNOSIS — G2581 Restless legs syndrome: Secondary | ICD-10-CM

## 2022-05-19 ENCOUNTER — Telehealth: Payer: Self-pay | Admitting: Neurology

## 2022-05-19 NOTE — Telephone Encounter (Signed)
Patient dismissed form [facility] and all providers practicing at this clinic, will no longer be able to provide medical care. 05/17/22

## 2022-06-07 ENCOUNTER — Ambulatory Visit: Payer: Medicare Other | Admitting: Neurology

## 2022-06-24 ENCOUNTER — Ambulatory Visit: Payer: Self-pay | Admitting: General Surgery

## 2022-06-24 NOTE — Pre-Procedure Instructions (Signed)
Surgical Instructions    Your procedure is scheduled on Thursday 07/01/22.   Report to [facility] Main Entrance "A" at 05:30 A.M., then check in with the Admitting office.  Call this number if you have problems the morning of surgery:  [phone]   If you have any questions prior to your surgery date call [phone]: Open Monday-Friday 8am-4pm If you experience any cold or flu symptoms such as cough, fever, chills, shortness of breath, etc. between now and your scheduled surgery, please notify us at the above number     Remember:  Do not eat after midnight the night before your surgery  You may drink clear liquids until 04:30 A.M. the morning of your surgery.   Clear liquids allowed are: Water, Non-Citrus Juices (without pulp), Carbonated Beverages, Clear Tea, Black Coffee ONLY (NO MILK, CREAM OR POWDERED CREAMER of any kind), and Gatorade    Take these medicines the morning of surgery with A SIP OF WATER:   DULoxetine (CYMBALTA)   ezetimibe (ZETIA)   levothyroxine (SYNTHROID)   metoprolol tartrate (LOPRESSOR)   rosuvastatin (CRESTOR)     Take these medicines if needed:   acetaminophen (TYLENOL)   ARTIFICIAL TEAR OP   ipratropium (ATROVENT)   ondansetron (ZOFRAN-ODT)   promethazine (PHENERGAN)    As of today, STOP taking any Aspirin (unless otherwise instructed by your surgeon) Aleve, Naproxen, Ibuprofen, Motrin, Advil, Goody's, BC's, all herbal medications, fish oil, and all vitamins.           Do not wear jewelry or makeup. Do not wear lotions, powders, perfumes/cologne or deodorant. Do not shave 48 hours prior to surgery.  Men may shave face and neck. Do not bring valuables to the hospital. Do not wear nail polish, gel polish, artificial nails, or any other type of covering on natural nails (fingers and toes) If you have artificial nails or gel coating that need to be removed by a nail salon, please have this removed prior to surgery. Artificial nails or gel coating  may interfere with anesthesia's ability to adequately monitor your vital signs.  [facility] is not responsible for any belongings or valuables.    Do NOT Smoke (Tobacco/Vaping)  24 hours prior to your procedure  If you use a CPAP at night, you may bring your mask for your overnight stay.   Contacts, glasses, hearing aids, dentures or partials may not be worn into surgery, please bring cases for these belongings   For patients admitted to the hospital, discharge time will be determined by your treatment team.   Patients discharged the day of surgery will not be allowed to drive home, and someone needs to stay with them for 24 hours.   SURGICAL WAITING ROOM VISITATION Patients having surgery or a procedure may have no more than 2 support people in the waiting area - these visitors may rotate.   Children under the age of 49 must have an adult with them who is not the patient. If the patient needs to stay at the hospital during part of their recovery, the visitor guidelines for inpatient rooms apply. Pre-op nurse will coordinate an appropriate time for 1 support person to accompany patient in pre-op.  This support person may not rotate.   Please refer to [email or web address] for the visitor guidelines for Inpatients (after your surgery is over and you are in a regular room).    Special instructions:    Oral Hygiene is also important to reduce your risk of infection.  Remember - BRUSH  YOUR TEETH THE MORNING OF SURGERY WITH YOUR REGULAR TOOTHPASTE   [place]- Preparing For Surgery  Before surgery, you can play an important role. Because skin is not sterile, your skin needs to be as free of germs as possible. You can reduce the number of germs on your skin by washing with CHG (chlorahexidine gluconate) Soap before surgery.  CHG is an antiseptic cleaner which kills germs and bonds with the skin to continue killing germs even after washing.      Please do not use if you have an allergy to CHG or antibacterial soaps. If your skin becomes reddened/irritated stop using the CHG.  Do not shave (including legs and underarms) for at least 48 hours prior to first CHG shower. It is OK to shave your face.  Please follow these instructions carefully.     Shower the NIGHT BEFORE SURGERY and the MORNING OF SURGERY with CHG Soap.   If you chose to wash your hair, wash your hair first as usual with your normal shampoo. After you shampoo, rinse your hair and body thoroughly to remove the shampoo.  Then [employer] and genitals (private parts) with your normal soap and rinse thoroughly to remove soap.  After that Use CHG Soap as you would any other liquid soap. You can apply CHG directly to the skin and wash gently with a scrungie or a clean washcloth.   Apply the CHG Soap to your body ONLY FROM THE NECK DOWN.  Do not use on open wounds or open sores. Avoid contact with your eyes, ears, mouth and genitals (private parts). Wash Face and genitals (private parts)  with your normal soap.   Wash thoroughly, paying special attention to the area where your surgery will be performed.  Thoroughly rinse your body with warm water from the neck down.  DO NOT shower/wash with your normal soap after using and rinsing off the CHG Soap.  Pat yourself dry with a CLEAN TOWEL.  Wear CLEAN PAJAMAS to bed the night before surgery  Place CLEAN SHEETS on your bed the night before your surgery  DO NOT SLEEP WITH PETS.   Day of Surgery:  Take a shower with CHG soap. Wear Clean/Comfortable clothing the morning of surgery Do not apply any deodorants/lotions.   Remember to brush your teeth WITH YOUR REGULAR TOOTHPASTE.    If you received a COVID test during your pre-op visit, it is requested that you wear a mask when out in public, stay away from anyone that may not be feeling well, and notify your surgeon if you develop symptoms. If you have been in contact  with anyone that has tested positive in the last 10 days, please notify your surgeon.    Please read over the following fact sheets that you were given.

## 2022-06-25 ENCOUNTER — Encounter (HOSPITAL_COMMUNITY)
Admission: RE | Admit: 2022-06-25 | Discharge: 2022-06-25 | Disposition: A | Discharge status: Home / Self Care | Payer: Medicare Other | Source: Ambulatory Visit | Attending: General Surgery | Admitting: General Surgery

## 2022-06-25 ENCOUNTER — Encounter (HOSPITAL_COMMUNITY): Payer: Self-pay

## 2022-06-25 ENCOUNTER — Other Ambulatory Visit: Payer: Self-pay

## 2022-06-25 VITALS — BP 129/77 | HR 71 | Temp 98.0°F | Resp 18 | Ht 64.0 in | Wt 225.5 lb

## 2022-06-25 DIAGNOSIS — Z6838 Body mass index (BMI) 38.0-38.9, adult: Secondary | ICD-10-CM | POA: Insufficient documentation

## 2022-06-25 DIAGNOSIS — I2510 Atherosclerotic heart disease of native coronary artery without angina pectoris: Secondary | ICD-10-CM | POA: Diagnosis not present

## 2022-06-25 DIAGNOSIS — F909 Attention-deficit hyperactivity disorder, unspecified type: Secondary | ICD-10-CM | POA: Diagnosis not present

## 2022-06-25 DIAGNOSIS — E785 Hyperlipidemia, unspecified: Secondary | ICD-10-CM | POA: Insufficient documentation

## 2022-06-25 DIAGNOSIS — I129 Hypertensive chronic kidney disease with stage 1 through stage 4 chronic kidney disease, or unspecified chronic kidney disease: Secondary | ICD-10-CM | POA: Diagnosis not present

## 2022-06-25 DIAGNOSIS — K760 Fatty (change of) liver, not elsewhere classified: Secondary | ICD-10-CM | POA: Diagnosis not present

## 2022-06-25 DIAGNOSIS — K432 Incisional hernia without obstruction or gangrene: Secondary | ICD-10-CM | POA: Diagnosis not present

## 2022-06-25 DIAGNOSIS — K219 Gastro-esophageal reflux disease without esophagitis: Secondary | ICD-10-CM | POA: Insufficient documentation

## 2022-06-25 DIAGNOSIS — Z01812 Encounter for preprocedural laboratory examination: Secondary | ICD-10-CM | POA: Diagnosis not present

## 2022-06-25 DIAGNOSIS — K5730 Diverticulosis of large intestine without perforation or abscess without bleeding: Secondary | ICD-10-CM | POA: Insufficient documentation

## 2022-06-25 DIAGNOSIS — I4719 Other supraventricular tachycardia: Secondary | ICD-10-CM | POA: Insufficient documentation

## 2022-06-25 DIAGNOSIS — Z85528 Personal history of other malignant neoplasm of kidney: Secondary | ICD-10-CM | POA: Diagnosis not present

## 2022-06-25 DIAGNOSIS — I493 Ventricular premature depolarization: Secondary | ICD-10-CM | POA: Diagnosis not present

## 2022-06-25 DIAGNOSIS — E669 Obesity, unspecified: Secondary | ICD-10-CM | POA: Diagnosis not present

## 2022-06-25 DIAGNOSIS — I700 Atherosclerosis of aorta: Secondary | ICD-10-CM | POA: Insufficient documentation

## 2022-06-25 DIAGNOSIS — I3481 Nonrheumatic mitral (valve) annulus calcification: Secondary | ICD-10-CM | POA: Insufficient documentation

## 2022-06-25 DIAGNOSIS — G4733 Obstructive sleep apnea (adult) (pediatric): Secondary | ICD-10-CM | POA: Insufficient documentation

## 2022-06-25 DIAGNOSIS — Z01818 Encounter for other preprocedural examination: Secondary | ICD-10-CM

## 2022-06-25 DIAGNOSIS — E039 Hypothyroidism, unspecified: Secondary | ICD-10-CM | POA: Insufficient documentation

## 2022-06-25 DIAGNOSIS — N189 Chronic kidney disease, unspecified: Secondary | ICD-10-CM | POA: Diagnosis not present

## 2022-06-25 LAB — CBC
HCT: 44.1 % (ref 36.0–46.0)
Hemoglobin: 14.3 g/dL (ref 12.0–15.0)
MCH: 27.5 pg (ref 26.0–34.0)
MCHC: 32.4 g/dL (ref 30.0–36.0)
MCV: 84.8 fL (ref 80.0–100.0)
Platelets: 266 10*3/uL (ref 150–400)
RBC: 5.2 MIL/uL — ABNORMAL HIGH (ref 3.87–5.11)
RDW: 14 % (ref 11.5–15.5)
WBC: 5.6 10*3/uL (ref 4.0–10.5)
nRBC: 0 % (ref 0.0–0.2)

## 2022-06-25 NOTE — Progress Notes (Signed)
Call from lab, BMP hemolyzed. New order put in for DOS.

## 2022-06-25 NOTE — Progress Notes (Signed)
PCP - Dr. Daisy Floro Cardiologist - Dr. Jodelle Red  PPM/ICD - Denies Device Orders - n/a Rep Notified - n/a  Chest x-ray - n/a EKG - 05/14/2022 Stress Test - 08/12/2020 ECHO - 01/21/2022 Cardiac Cath - 06/08/2016  Sleep Study - +OSA in 2018. Pt trialed CPAP, but per Dr. Vassie Loll the issue isn't related to obstruction but rather a neurological issue.  No DM  Last dose of GLP1 agonist- n/a GLP1 instructions: n/a  Blood Thinner Instructions: n/a Aspirin Instructions: n/a  ERAS Protcol - Clear liquids until 0430 morning of surgery PRE-SURGERY Ensure or G2- n/a  COVID TEST- n/a   Anesthesia review: Yes. Cardiac clearance 05/14/2022   Patient denies shortness of breath, fever, cough and chest pain at PAT appointment. Pt does endorse a runny nose related to seasonal allergies. Pt instructed to let us know if she develops any additional symptoms such as productive cough, fever, and/or sore throat.   All instructions explained to the patient, with a verbal understanding of the material. Patient agrees to go over the instructions while at home for a better understanding. Patient also instructed to self quarantine after being tested for COVID-19. The opportunity to ask questions was provided.

## 2022-06-25 NOTE — Pre-Procedure Instructions (Signed)
Surgical Instructions    Your procedure is scheduled on Thursday 07/01/22.   Report to [facility] Main Entrance "A" at 05:30 A.M., then check in with the Admitting office.  Call this number if you have problems the morning of surgery:  [phone]   If you have any questions prior to your surgery date call [phone]: Open Monday-Friday 8am-4pm If you experience any cold or flu symptoms such as cough, fever, chills, shortness of breath, etc. between now and your scheduled surgery, please notify us at the above number     Remember:  Do not eat after midnight the night before your surgery  You may drink clear liquids until 04:30 A.M. the morning of your surgery.   Clear liquids allowed are: Water, Non-Citrus Juices (without pulp), Carbonated Beverages, Clear Tea, Black Coffee ONLY (NO MILK, CREAM OR POWDERED CREAMER of any kind), and Gatorade    Take these medicines the morning of surgery with A SIP OF WATER:   DULoxetine (CYMBALTA)   ezetimibe (ZETIA)   levothyroxine (SYNTHROID)   metoprolol tartrate (LOPRESSOR)   rosuvastatin (CRESTOR)     Take these medicines if needed:   acetaminophen (TYLENOL)   ARTIFICIAL TEAR OP   ipratropium (ATROVENT)   ondansetron (ZOFRAN-ODT)   promethazine (PHENERGAN)    As of today, STOP taking any Aspirin (unless otherwise instructed by your surgeon) Aleve, Naproxen, Ibuprofen, Motrin, Advil, Goody's, BC's, all herbal medications, fish oil, and all vitamins.   Do NOT Smoke (Tobacco/Vaping)  24 hours prior to your procedure  If you use a CPAP at night, you may bring your mask for your overnight stay.   Contacts, glasses, hearing aids, dentures or partials may not be worn into surgery, please bring cases for these belongings   For patients admitted to the hospital, discharge time will be determined by your treatment team.   Patients discharged the day of surgery will not be allowed to drive home, and someone needs to stay with them for 24  hours.   SURGICAL WAITING ROOM VISITATION Patients having surgery or a procedure may have no more than 2 support people in the waiting area - these visitors may rotate.   Children under the age of 89 must have an adult with them who is not the patient. If the patient needs to stay at the hospital during part of their recovery, the visitor guidelines for inpatient rooms apply. Pre-op nurse will coordinate an appropriate time for 1 support person to accompany patient in pre-op.  This support person may not rotate.   Please refer to [email or web address] for the visitor guidelines for Inpatients (after your surgery is over and you are in a regular room).    Special instructions:    Oral Hygiene is also important to reduce your risk of infection.  Remember - BRUSH YOUR TEETH THE MORNING OF SURGERY WITH YOUR REGULAR TOOTHPASTE   [place]- Preparing For Surgery  Before surgery, you can play an important role. Because skin is not sterile, your skin needs to be as free of germs as possible. You can reduce the number of germs on your skin by washing with CHG (chlorahexidine gluconate) Soap before surgery.  CHG is an antiseptic cleaner which kills germs and bonds with the skin to continue killing germs even after washing.     Please do not use if you have an allergy to CHG or antibacterial soaps. If your skin becomes reddened/irritated stop using the CHG.  Do not shave (including legs and underarms) for at  least 48 hours prior to first CHG shower. It is OK to shave your face.  Please follow these instructions carefully.     Shower the NIGHT BEFORE SURGERY and the MORNING OF SURGERY with CHG Soap.   If you chose to wash your hair, wash your hair first as usual with your normal shampoo. After you shampoo, rinse your hair and body thoroughly to remove the shampoo.  Then [employer] and genitals (private parts) with your normal soap and rinse thoroughly  to remove soap.  After that Use CHG Soap as you would any other liquid soap. You can apply CHG directly to the skin and wash gently with a scrungie or a clean washcloth.   Apply the CHG Soap to your body ONLY FROM THE NECK DOWN.  Do not use on open wounds or open sores. Avoid contact with your eyes, ears, mouth and genitals (private parts). Wash Face and genitals (private parts)  with your normal soap.   Wash thoroughly, paying special attention to the area where your surgery will be performed.  Thoroughly rinse your body with warm water from the neck down.  DO NOT shower/wash with your normal soap after using and rinsing off the CHG Soap.  Pat yourself dry with a CLEAN TOWEL.  Wear CLEAN PAJAMAS to bed the night before surgery  Place CLEAN SHEETS on your bed the night before your surgery  DO NOT SLEEP WITH PETS.   Day of Surgery: Take a shower with CHG soap. Do not wear jewelry or makeup. Do not wear lotions, powders, perfumes/cologne or deodorant. Do not shave 48 hours prior to surgery.  Men may shave face and neck. Do not bring valuables to the hospital. [facility] is not responsible for any belongings or valuables.   Do not wear nail polish, gel polish, artificial nails, or any other type of covering on natural nails (fingers and toes) If you have artificial nails or gel coating that need to be removed by a nail salon, please have this removed prior to surgery. Artificial nails or gel coating may interfere with anesthesia's ability to adequately monitor your vital signs.  Wear Clean/Comfortable clothing the morning of surgery Remember to brush your teeth WITH YOUR REGULAR TOOTHPASTE.    If you received a COVID test during your pre-op visit, it is requested that you wear a mask when out in public, stay away from anyone that may not be feeling well, and notify your surgeon if you develop symptoms. If you have been in contact with anyone that has tested positive in the last 10  days, please notify your surgeon.    Please read over the following fact sheets that you were given.

## 2022-06-28 NOTE — Anesthesia Preprocedure Evaluation (Signed)
Anesthesia Evaluation  Patient identified by MRN, date of birth, ID band Patient awake    Reviewed: Allergy & Precautions, NPO status , Patient's Chart, lab work & pertinent test results, reviewed documented beta blocker date and time   History of Anesthesia Complications (+) PONV, DIFFICULT AIRWAY and history of anesthetic complications  Airway Mallampati: II  TM Distance: >3 FB Neck ROM: Full    Dental no notable dental hx. (+) Chipped, Dental Advisory Given,    Pulmonary shortness of breath and with exertion, sleep apnea and Continuous Positive Airway Pressure Ventilation    Pulmonary exam normal breath sounds clear to auscultation       Cardiovascular hypertension, Pt. on medications + CAD  Normal cardiovascular exam Rhythm:Regular Rate:Normal  EKG 05/14/22 SB otherwise normal  Echo 01/21/22 1. Left ventricular ejection fraction, by estimation, is 55 to 60%. The  left ventricle has normal function. The left ventricle has no regional  wall motion abnormalities. There is moderate left ventricular hypertrophy.  Left ventricular diastolic  parameters are indeterminate. The average left ventricular global  longitudinal strain is -18.0 %. The global longitudinal strain is normal.   2. Right ventricular systolic function is normal. The right ventricular  size is normal. There is normal pulmonary artery systolic pressure.   3. Left atrial size was mild to moderately dilated.   4. Right atrial size was mild to moderately dilated.   5. The mitral valve is grossly normal. Trivial mitral valve  regurgitation. No evidence of mitral stenosis.   6. The aortic valve is grossly normal. There is mild calcification of the  aortic valve. Aortic valve regurgitation is not visualized. Aortic valve  sclerosis is present, with no evidence of aortic valve stenosis.   7. The inferior vena cava is normal in size with greater than 50%  respiratory  variability, suggesting right atrial pressure of 3 mmHg.   Cardiac Cath 06/08/2016  50-70% stenosis in the mid LAD proximal to the first diagonal. FFR not performed because of difficulty with catheter control from the right radial.   75-80% stenosis in the midsegment of the first obtuse marginal.   Luminal irregularities in the mid RCA.   Overall normal LV function with EF percent.    Recommendations:     Aggressive risk factor modification and anti-ischemic therapy ( beta blocker, statin therapy and possibly LA nitrates) in this non-ACS subset. PCI in this setting does not meet appropriate use criteria.   If limiting symptoms on medications or convincing evidence of ischemia, consider obtuse marginal PCI and/or higher than usual risk PCI on the LAD (due to calcification and angulation).      Neuro/Psych  Headaches PSYCHIATRIC DISORDERS Anxiety Depression    PTSD ADDRestless legs syndrome  Neuromuscular disease    GI/Hepatic Neg liver ROS,GERD  Medicated,,  Endo/Other  Hypothyroidism  Hyperlipidemia Obesity  Renal/GU Renal InsufficiencyRenal diseaseS/P right nephrectomy- renal cell Ca     Musculoskeletal  (+) Arthritis , Osteoarthritis,  Fibromyalgia -Incisional hernia   Abdominal  (+) + obese  Peds  Hematology negative hematology ROS (+)   Anesthesia Other Findings   Reproductive/Obstetrics                              Anesthesia Physical Anesthesia Plan  ASA: 3  Anesthesia Plan: General   Post-op Pain Management: Dilaudid IV   Induction: Intravenous  PONV Risk Score and Plan: 4 or greater and Treatment may vary due to  age or medical condition, Ondansetron and Dexamethasone  Airway Management Planned: Oral ETT  Additional Equipment: None  Intra-op Plan:   Post-operative Plan: Extubation in OR  Informed Consent: I have reviewed the patients History and Physical, chart, labs and discussed the procedure including the risks,  benefits and alternatives for the proposed anesthesia with the patient or authorized representative who has indicated his/her understanding and acceptance.     Dental advisory given  Plan Discussed with: CRNA and Anesthesiologist  Anesthesia Plan Comments: (PAT note written 06/28/2022 by Shonna Chock, PA-C.  )        Anesthesia Quick Evaluation

## 2022-06-28 NOTE — Progress Notes (Signed)
Anesthesia Chart Review:  Case: [id number] Date/Time: 07/01/22 0715   Procedure: OPEN INCISIONAL HERNIA REPAIR WITH MESH   Anesthesia type: General   Pre-op diagnosis: INCISIONAL HERNIA   [facility] 09 / [facility]   Surgeons: Axel Filleramirez, Armando, MD       DISCUSSION: Patient is a 77 year old female scheduled for the above procedure.  Surgery was initially for 04/21/22 but postponed to allow time for cardiology preoperative assessment. Since then she had evaluation on 05/14/22 with Gillian ShieldsWalker, Caitlin, NP. She wrote, "Preop clearance - Pending hiatal hernia repair. According to the Revised Cardiac Risk Index (RCRI), her Perioperative Risk of Major Cardiac Event is (%): 0.9. Her Functional Capacity in METs is: 6.05 according to the Duke Activity Status Index (DASI). 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 BMI is consistent with obesity.     She is followed by cardiologist Dr. Cristal Deerhristopher for history of palpitations (with history of PAT s/p ablation) and HTN. Non-ischemic stress test in May 2022.  She did have an echo on 01/21/22 that showed LVEF 55-60%, no regional wall motion abnormalities, moderate LVH, normal PASP, mild-moderately dilated LA/RA, trivial MR. It appears a Zio monitor was ordered last year, but there are no results, so unclear if monitoring person was completed (some earlier notes suggest difficulty with pads sticking). Monitor in 02/2019 showed predominantly SR, 8  beat run of SVT, < 1% atrial/ventricular ectopy. Palpitations were controlled on metoprolol at her 05/14/22 APP visit. Six month follow-up planned.   On 06/25/22, she noted a runny nose related to seasonal allergies, so was advised to notify staff is she developed a change in her symptoms that suggested other etiology (ie, productive cough, fever, or sore throat).  Anesthesia team to evaluate on the day of surgery. Her BMP specimen from 06/25/22 hemolyzed, so repeat BMP planned on arrival for surgery. Creatinine ~ 1.0-1.3 in CHL since ~ 10/2021.   VS: BP 129/77   Pulse 71   Temp 36.7 C (Oral)   Resp 18   Ht 5\' 4"  (1.626 m)   Wt 102.3 kg   SpO2 99%   BMI 38.71 kg/m    PROVIDERS: Daisy Florooss, Charles Alan, MD is PCP  Jodelle Redhristopher, Bridgette, MD is cardiologist Kasandra KnudsenPace, Maryellen, MD is urologist  Shon MilletJaffe, Adam, DO is neurologist Si GaulMohamed, Mohamed, MD is [facility] Cyril MourningAlva, Rakesh, MD is pulmonologist   LABS: See DISCUSSION. (all labs ordered are listed, but only abnormal results are displayed)  Labs Reviewed  CBC - Abnormal; Notable for the following components:      Result Value   RBC 5.20 (*)    All other components within normal limits    Sleep study 12/01/16: IMPRESSIONS: Moderate obstructive sleep apnea (AHI of 12.3/hour of sleep) with hypopneas causing oxygen desaturation. Patient reported not sleeping well during the night, so this is probably an underestimate. RECOMMENDATIONS: Treatment options for this degree of sleep ordered breathing include weight loss and CPAP therapy.  Based on the prior titration study, CPAP 9 cm can be used...   IMAGES: CT Chest/Abd/Pelvis 01/28/22:  IMPRESSION: 1. Status post right radical nephrectomy with no definitive findings to suggest locally recurrent disease or metastatic disease to the chest, abdomen or pelvis. 2. Moderate-sized ventral hernia containing loops of mid small bowel, without evidence of bowel incarceration or obstruction at this time. 3.  Hepatic steatosis. 4. Cholelithiasis without evidence of acute cholecystitis at this time. 5. Colonic diverticulosis without evidence of acute diverticulitis. 6. Aortic atherosclerosis, in addition to left main and three-vessel coronary artery disease. Assessment for potential risk factor modification, dietary therapy or pharmacologic therapy may be warranted, if clinically indicated. 7. There are calcifications of the aortic valve and mitral annulus. Echocardiographic correlation for evaluation of potential valvular dysfunction may be warranted if clinically indicated. 8. Additional incidental findings, as above.     EKG: 05/14/22: SB at 56 bpm     CV: Echo 01/21/2022 IMPRESSIONS   1. Left ventricular ejection fraction, by estimation, is 55 to 60%. The  left ventricle has normal function. The left ventricle has no regional  wall motion abnormalities. There is moderate left ventricular hypertrophy.  Left ventricular diastolic  parameters are indeterminate. The average left ventricular global  longitudinal strain is -18.0 %. The global longitudinal strain is normal.   2. Right ventricular systolic function is normal. The right ventricular  size is normal. There is normal pulmonary artery systolic pressure.   3. Left atrial size was mild to moderately dilated.   4. Right atrial size was mild to moderately dilated.   5. The mitral valve is grossly normal. Trivial mitral valve  regurgitation. No evidence of mitral stenosis.   6. The aortic valve is grossly normal. There is mild calcification of the  aortic valve. Aortic valve regurgitation is not visualized. Aortic valve  sclerosis is present, with no evidence of aortic valve stenosis.   7. The inferior vena cava is normal in size with greater than 50%  respiratory variability, suggesting right atrial pressure of 3 mmHg.  - Comparison(s): No significant change from prior study.  - Conclusion(s)/Recommendation(s): Otherwise normal  echocardiogram, with  minor abnormalities described in the report.        Myocardial Perfusion 08/12/2020 The left ventricular ejection fraction is normal (55-65%). Nuclear stress EF: 63%. There was no ST segment deviation noted during stress. No T wave inversion was noted during stress. The study is normal. This is a low risk study.   1.  Normal study without evidence of ischemia or infarction. 2.  Normal left ventricular function, EF 63%. 3.  This is a low risk study.     7-day ZIO monitor 03/20/2019 Minimum heart rate of 43 bpm, maximal heart rate of 193 bpm, and an average heart rate of 66 bpm. Predominantly underlying rhythm was sinus rhythm. No VT, atrial fibrillation, high degree block, or pauses noted 1 true SVT noted, 8 beats at 193 bpm. 2 other events labeled as SVT are either sinus or difficult to determine. Isolated atrial and ventricular ectopy was rare, less than 1%. There were 42 triggered events, the vast majority of which were sinus rhythm with or without brief ectopy. There is 1 significant artifact on many on the triggered events. There was one episode of ventricular trigeminy lasting approximately 10 seconds. No high risk findings.    Cardiac Cath 06/08/2016 50-70% stenosis in the mid LAD proximal to the first diagonal. FFR not performed because of difficulty with catheter control from the right radial.  75-80% stenosis in the midsegment of the first obtuse marginal.  Luminal irregularities in the  mid RCA.  Overall normal LV function with EF percent.    Recommendations:  Aggressive risk factor modification and anti-ischemic therapy ( beta blocker, statin therapy and possibly LA nitrates) in this non-ACS subset. PCI in this setting does not meet appropriate use criteria.  If limiting symptoms on medications or convincing evidence of ischemia, consider obtuse marginal PCI and/or higher than usual risk PCI on the LAD (due to calcification and angulation).     US  Carotid 04/22/2013 Summary:  Findings suggest 1-39% internal carotid artery stenosis  bilaterally. The left vertebral artery is patent with  antegrade flow. Unable to visualize the right vertebral  artery.     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 FFR of LAD  not performed because of difficulty with catheter control from the right radial.    Chronic cough 02/22/2020   Chronic kidney disease    hx of kidney cancer   Diastolic dysfunction, left ventricle 05/31/2013   Difficult intubation    told by [facility] that she was hard to intubate 15 yrs ago in [place]- surgery since then no problems   Dyspnea    Essential hypertension 05/31/2013   Fibromyalgia    Generalized anxiety disorder    Adequate for discharge    GERD (gastroesophageal reflux disease)    History of attention deficit disorder    History of echocardiogram 07/2012   Normal LVF w grade I siastolic dysfunction    History of endometriosis    History of gall stones 12/31/2009   History of pleural effusion    Hyperlipidemia 06/24/2016   LDL goal <70   Hypertension    Hypothyroidism 1990   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 Vassie Loll discussed inspire device, patient would need to lose weight   PAT (paroxysmal atrial tachycardia)    s/p ablation   PONV (postoperative nausea and vomiting)    Pre-diabetes    PTSD (post-traumatic stress disorder)    Pure hypercholesterolemia 02/18/2019   PVC's (premature ventricular contractions)    Renal mass 02/15/2012   Restless legs syndrome (RLS) 02/20/2007   Vertigo     Past Surgical History:  Procedure Laterality Date   CARDIAC ELECTROPHYSIOLOGY MAPPING AND ABLATION  2000s   LAPAROSCOPIC NEPHRECTOMY, HAND ASSISTED Right 07/31/2019   Procedure: HAND ASSISTED LAPAROSCOPIC NEPHRECTOMY CONVERTED TO OPEN WITH REPAIR OF INFERIOR VENA CAVAOTOMY;  Surgeon: Noel Christmas, MD;  Location: [facility];  Service: Urology;  Laterality: Right;  3 HRS   laparoscopy     LEFT HEART CATH AND CORONARY ANGIOGRAPHY N/A 06/08/2016   Procedure: Left  Heart Cath and Coronary Angiography;  Surgeon: Lyn Records, MD;  Location: [facility] INVASIVE CV LAB;  Service: Cardiovascular;  Laterality: N/A;   nasoseptal reconstruction  1990s   NEPHRECTOMY RECIPIENT Right 08/2019   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   THYROIDECTOMY  1990   TONSILLECTOMY     as a child   TUBAL LIGATION  1970s   uterine mass removal  03/2012   was found to be benign    MEDICATIONS:  acetaminophen (TYLENOL) 500 MG tablet   ARTIFICIAL TEAR OP   B Complex Vitamins (B COMPLEX PO)   diphenhydrAMINE (BENADRYL) 25 mg capsule   DULoxetine (CYMBALTA) 30 MG capsule   ezetimibe (ZETIA) 10 MG tablet   ipratropium (ATROVENT) 0.06 % nasal spray   levothyroxine (SYNTHROID) 88 MCG tablet   losartan-hydrochlorothiazide (HYZAAR) 50-12.5 MG tablet   Magnesium Oxide, Antacid, 500 MG CAPS   methylphenidate (RITALIN) 5 MG tablet   metoprolol tartrate (LOPRESSOR) 25 MG tablet   MINERAL ICE 2 % GEL   Omega-3 Fatty Acids (FISH OIL PO)   promethazine (PHENERGAN) 25 MG tablet    rOPINIRole (REQUIP) 0.5 MG tablet   rosuvastatin (CRESTOR) 10 MG tablet   rosuvastatin (CRESTOR) 5 MG tablet   UNABLE TO FIND   No current facility-administered medications for this encounter.  She takes "Delta 8" at bedtime and appear to include THC.   Shonna Chock, PA-C Surgical Short Stay/Anesthesiology [facility] Phone [phone] [facility] Phone [phone] 06/28/2022 4:12 PM

## 2022-06-30 ENCOUNTER — Ambulatory Visit: Payer: Medicare Other | Admitting: Oncology

## 2022-06-30 ENCOUNTER — Encounter (HOSPITAL_COMMUNITY): Payer: Self-pay | Admitting: General Surgery

## 2022-06-30 ENCOUNTER — Inpatient Hospital Stay: Payer: Medicare Other | Attending: Internal Medicine | Admitting: Internal Medicine

## 2022-06-30 NOTE — H&P (Signed)
Chief Complaint: Follow-up (Incisional hernia)       History of Present Illness: [name] is a 76 y.o. female who is seen today as an office consultation at the request of Dr. Self for evaluation of Follow-up (Incisional hernia) .     Patient is a 76-year-old female, who comes in secondary to an incisional hernia. Patient has been seen multiple occasions.  She does have a significant history of major depressive disorder.   She states that she feels her heart is getting larger.  She previously underwent CT scan.  This was significant for approximate 10 cm incisional hernia.  This is in the midline.  Patient also with a right subcostal incision.     Patient states that she lost approximately 15 pounds.  She feels that she has plateaued.   Review of Systems: A complete review of systems was obtained from the patient.  I have reviewed this information and discussed as appropriate with the patient.  See HPI as well for other ROS.   Review of Systems  Constitutional:  Negative for fever.  HENT:  Negative for congestion.   Eyes:  Negative for blurred vision.  Respiratory:  Negative for cough, shortness of breath and wheezing.   Cardiovascular:  Negative for chest pain and palpitations.  Gastrointestinal:  Negative for heartburn.  Genitourinary:  Negative for dysuria.  Musculoskeletal:  Negative for myalgias.  Skin:  Negative for rash.  Neurological:  Negative for dizziness and headaches.  Psychiatric/Behavioral:  Negative for depression and suicidal ideas.   All other systems reviewed and are negative.       Medical History: Past Medical History Past Medical History: Diagnosis Date  Anxiety    Arthritis    Chronic kidney disease    History of cancer    Hypertension    Sleep apnea    Thyroid disease        There is no problem list on file for this patient.     Past Surgical History Past Surgical History: Procedure Laterality Date  NEPHRECTOMY N/A     THYROIDECTOMY TOTAL N/A        Allergies Allergies Allergen Reactions  Ketorolac Other (See Comments)     Chest pains Chest pains    Lithium Other (See Comments) and Nausea     Off balance, increased heart rate Off balance, increased heart rate    Ziprasidone Other (See Comments)     Extremely agitated Extreme agitation Extremely agitated    Brexpiprazole Other (See Comments) and Swelling     Elevated BP, created aggression Elevated BP, created aggression    Fluconazole Other (See Comments) and Nausea And Vomiting  Lurasidone Other (See Comments) and Unknown     Reports made her mind race more and irritable  Reports made her mind race more and made her irritable     Prochlorperazine Nausea And Vomiting  Tramadol Other (See Comments)      Current Outpatient Medications on File Prior to Visit Medication Sig Dispense Refill  b complex vitamins tablet Take 1 tablet by mouth once daily      levothyroxine (SYNTHROID) 75 MCG tablet TAKE 1 TABLET BY MOUTH EVERY MORNING ON AN EMPTY STOMACH 90      losartan-hydrochlorothiazide (HYZAAR) 50-12.5 mg tablet 1 tablet      metoprolol tartrate (LOPRESSOR) 25 MG tablet 1 tablet with food      ondansetron (ZOFRAN-ODT) 8 MG disintegrating tablet Take by mouth      polyethylene glycol (MIRALAX) powder Take   by mouth      predniSONE (DELTASONE) 10 mg tablet pack TAKE 6 TABLETS ON DAY 1 AS DIRECTED ON PACKAGE AND DECREASE BY 1 TAB EACH DAY FOR A TOTAL OF 6 DAYS      promethazine (PHENERGAN) 12.5 MG tablet TAKE 1 TABLET THREE TIMES A DAY AS NEEDED FOR NAUSEA 3 DAYS      rOPINIRole (REQUIP) 0.5 MG tablet 2 tablets      rosuvastatin (CRESTOR) 5 MG tablet Take 1 tablet (5 mg total) by mouth once daily      traZODone (DESYREL) 100 MG tablet Take by mouth       No current facility-administered medications on file prior to visit.     Family History Family History Problem Relation Age of Onset  Stroke Mother    Heart valve disease Mother     Skin cancer Father    Colon cancer Father    Obesity Sister        Social History   Tobacco Use Smoking Status Never Smokeless Tobacco Never     Social History Social History    Socioeconomic History  Marital status: Single Tobacco Use  Smoking status: Never  Smokeless tobacco: Never Vaping Use  Vaping Use: Never used Substance and Sexual Activity  Alcohol use: Never  Drug use: Never      Objective:     Vitals:   03/05/22 1026 PainSc: 0-No pain   There is no height or weight on file to calculate BMI.   Physical Exam Constitutional:      Appearance: Normal appearance.  HENT:     Head: Normocephalic and atraumatic.     Mouth/Throat:     Mouth: Mucous membranes are moist.     Pharynx: Oropharynx is clear.  Eyes:     General: No scleral icterus.    Pupils: Pupils are equal, round, and reactive to light.  Cardiovascular:     Rate and Rhythm: Normal rate and regular rhythm.     Pulses: Normal pulses.     Heart sounds: No murmur heard.    No friction rub. No gallop.  Pulmonary:     Effort: Pulmonary effort is normal. No respiratory distress.     Breath sounds: Normal breath sounds. No stridor.  Abdominal:     General: Abdomen is flat.     Musculoskeletal:        General: No swelling.  Skin:    General: Skin is warm.  Neurological:     General: No focal deficit present.     Mental Status: She is alert and oriented to person, place, and time. Mental status is at baseline.  Psychiatric:        Mood and Affect: Mood normal.        Thought Content: Thought content normal.        Judgment: Judgment normal.        Hernia Size:10cm Incarcerated: no Initial Hernia     Assessment and Plan: Diagnoses and all orders for this visit:   Incisional hernia without obstruction or gangrene     [name] is a 76 y.o. female    1.  We will proceed to the OR for a open incisional hernia repair with mesh, likely retrorectus. 2. All risks and  benefits were discussed with the patient, to generally include infection, bleeding, damage to surrounding structures, acute and chronic nerve pain, and recurrence. Alternatives were offered and described.  All questions were answered and the patient voiced understanding of the   procedure and wishes to proceed at this point.             No follow-ups on file.   Reena Borromeo, MD, FACS [facility]  

## 2022-07-01 ENCOUNTER — Ambulatory Visit (HOSPITAL_BASED_OUTPATIENT_CLINIC_OR_DEPARTMENT_OTHER): Payer: Medicare Other | Admitting: Anesthesiology

## 2022-07-01 ENCOUNTER — Other Ambulatory Visit: Payer: Self-pay

## 2022-07-01 ENCOUNTER — Inpatient Hospital Stay (HOSPITAL_COMMUNITY)
Admission: AD | Admit: 2022-07-01 | Discharge: 2022-07-05 | DRG: 355 | Disposition: A | Discharge status: Home / Self Care | Payer: Medicare Other | Attending: General Surgery | Admitting: General Surgery

## 2022-07-01 ENCOUNTER — Encounter (HOSPITAL_COMMUNITY)
Admission: AD | Disposition: A | Discharge status: Home / Self Care | Payer: Self-pay | Source: Home / Self Care | Attending: General Surgery

## 2022-07-01 ENCOUNTER — Encounter (HOSPITAL_COMMUNITY): Payer: Self-pay | Admitting: General Surgery

## 2022-07-01 ENCOUNTER — Ambulatory Visit (HOSPITAL_COMMUNITY): Payer: Medicare Other | Admitting: Vascular Surgery

## 2022-07-01 DIAGNOSIS — K5900 Constipation, unspecified: Secondary | ICD-10-CM | POA: Diagnosis not present

## 2022-07-01 DIAGNOSIS — Z66 Do not resuscitate: Secondary | ICD-10-CM | POA: Diagnosis present

## 2022-07-01 DIAGNOSIS — Z79890 Hormone replacement therapy (postmenopausal): Secondary | ICD-10-CM

## 2022-07-01 DIAGNOSIS — Z79899 Other long term (current) drug therapy: Secondary | ICD-10-CM | POA: Diagnosis not present

## 2022-07-01 DIAGNOSIS — Z885 Allergy status to narcotic agent status: Secondary | ICD-10-CM

## 2022-07-01 DIAGNOSIS — I2510 Atherosclerotic heart disease of native coronary artery without angina pectoris: Secondary | ICD-10-CM

## 2022-07-01 DIAGNOSIS — K430 Incisional hernia with obstruction, without gangrene: Secondary | ICD-10-CM

## 2022-07-01 DIAGNOSIS — Z823 Family history of stroke: Secondary | ICD-10-CM | POA: Diagnosis not present

## 2022-07-01 DIAGNOSIS — F329 Major depressive disorder, single episode, unspecified: Secondary | ICD-10-CM | POA: Diagnosis present

## 2022-07-01 DIAGNOSIS — E039 Hypothyroidism, unspecified: Secondary | ICD-10-CM

## 2022-07-01 DIAGNOSIS — G4730 Sleep apnea, unspecified: Secondary | ICD-10-CM | POA: Diagnosis not present

## 2022-07-01 DIAGNOSIS — I10 Essential (primary) hypertension: Secondary | ICD-10-CM | POA: Diagnosis present

## 2022-07-01 DIAGNOSIS — Z9889 Other specified postprocedural states: Secondary | ICD-10-CM | POA: Diagnosis present

## 2022-07-01 DIAGNOSIS — Z8719 Personal history of other diseases of the digestive system: Principal | ICD-10-CM

## 2022-07-01 DIAGNOSIS — Z888 Allergy status to other drugs, medicaments and biological substances status: Secondary | ICD-10-CM

## 2022-07-01 DIAGNOSIS — F419 Anxiety disorder, unspecified: Secondary | ICD-10-CM | POA: Diagnosis present

## 2022-07-01 DIAGNOSIS — E079 Disorder of thyroid, unspecified: Secondary | ICD-10-CM | POA: Diagnosis present

## 2022-07-01 DIAGNOSIS — Z01818 Encounter for other preprocedural examination: Secondary | ICD-10-CM

## 2022-07-01 HISTORY — PX: INCISIONAL HERNIA REPAIR: SHX193

## 2022-07-01 HISTORY — PX: INSERTION OF MESH: SHX5868

## 2022-07-01 LAB — BASIC METABOLIC PANEL
Anion gap: 10 (ref 5–15)
BUN: 20 mg/dL (ref 8–23)
CO2: 27 mmol/L (ref 22–32)
Calcium: 9.8 mg/dL (ref 8.9–10.3)
Chloride: 99 mmol/L (ref 98–111)
Creatinine, Ser: 1.14 mg/dL — ABNORMAL HIGH (ref 0.44–1.00)
GFR, Estimated: 50 mL/min — ABNORMAL LOW (ref >60)
Glucose, Bld: 128 mg/dL — ABNORMAL HIGH (ref 70–99)
Potassium: 3 mmol/L — ABNORMAL LOW (ref 3.5–5.1)
Sodium: 136 mmol/L (ref 135–145)

## 2022-07-01 SURGERY — REPAIR, HERNIA, INCISIONAL
Anesthesia: General | Site: Abdomen

## 2022-07-01 MED ORDER — OXYCODONE HCL 5 MG/5ML PO SOLN: 5.0000 mg | Freq: Once | ORAL | Status: DC | PRN

## 2022-07-01 MED ORDER — DIPHENHYDRAMINE HCL 25 MG PO CAPS: 25.0000 mg | ORAL_CAPSULE | ORAL | Status: DC | PRN

## 2022-07-01 MED ORDER — CEFAZOLIN SODIUM-DEXTROSE 2-4 GM/100ML-% IV SOLN
2.0000 g | INTRAVENOUS | Status: AC
  Administered 2022-07-01: 2 g via INTRAVENOUS
  Filled 2022-07-01: qty 100

## 2022-07-01 MED ORDER — LIDOCAINE 2% (20 MG/ML) 5 ML SYRINGE
INTRAMUSCULAR | Status: DC | PRN
  Administered 2022-07-01: 70 mg via INTRAVENOUS

## 2022-07-01 MED ORDER — PHENYLEPHRINE HCL-NACL 20-0.9 MG/250ML-% IV SOLN
INTRAVENOUS | Status: DC | PRN
  Administered 2022-07-01: 30 ug/min via INTRAVENOUS

## 2022-07-01 MED ORDER — ACETAMINOPHEN 500 MG PO TABS
1000.0000 mg | ORAL_TABLET | Freq: Four times a day (QID) | ORAL | Status: DC
  Administered 2022-07-01 – 2022-07-05 (×7): 1000 mg via ORAL
  Filled 2022-07-01 (×11): qty 2

## 2022-07-01 MED ORDER — SUGAMMADEX SODIUM 200 MG/2ML IV SOLN
INTRAVENOUS | Status: DC | PRN
  Administered 2022-07-01: 200 mg via INTRAVENOUS

## 2022-07-01 MED ORDER — ONDANSETRON HCL 4 MG/2ML IJ SOLN
4.0000 mg | Freq: Four times a day (QID) | INTRAMUSCULAR | Status: DC | PRN
  Administered 2022-07-01 – 2022-07-02 (×2): 4 mg via INTRAVENOUS
  Filled 2022-07-01 (×2): qty 2

## 2022-07-01 MED ORDER — DEXTROSE-NACL 5-0.9 % IV SOLN: INTRAVENOUS | Status: DC

## 2022-07-01 MED ORDER — FENTANYL CITRATE (PF) 250 MCG/5ML IJ SOLN
INTRAMUSCULAR | Status: AC
  Filled 2022-07-01: qty 5

## 2022-07-01 MED ORDER — MIDAZOLAM HCL 2 MG/2ML IJ SOLN
INTRAMUSCULAR | Status: DC | PRN
  Administered 2022-07-01: 2 mg via INTRAVENOUS

## 2022-07-01 MED ORDER — ROPINIROLE HCL 0.5 MG PO TABS: 0.5000 mg | ORAL_TABLET | Freq: Every day | ORAL | Status: DC

## 2022-07-01 MED ORDER — DULOXETINE HCL 30 MG PO CPEP
30.0000 mg | ORAL_CAPSULE | Freq: Two times a day (BID) | ORAL | Status: DC
  Administered 2022-07-02 – 2022-07-05 (×6): 30 mg via ORAL
  Filled 2022-07-01 (×8): qty 1

## 2022-07-01 MED ORDER — ORAL CARE MOUTH RINSE: 15.0000 mL | Freq: Once | OROMUCOSAL | Status: AC

## 2022-07-01 MED ORDER — CHLORHEXIDINE GLUCONATE CLOTH 2 % EX PADS
6.0000 | MEDICATED_PAD | Freq: Once | CUTANEOUS | Status: DC
  Administered 2022-07-01: 6 via TOPICAL

## 2022-07-01 MED ORDER — STERILE WATER FOR IRRIGATION IR SOLN
Status: DC | PRN
  Administered 2022-07-01: 1000 mL

## 2022-07-01 MED ORDER — METOPROLOL TARTRATE 25 MG PO TABS
25.0000 mg | ORAL_TABLET | Freq: Every day | ORAL | Status: DC
  Administered 2022-07-03: 25 mg via ORAL
  Filled 2022-07-01 (×4): qty 1

## 2022-07-01 MED ORDER — PROPOFOL 10 MG/ML IV BOLUS
INTRAVENOUS | Status: AC
  Filled 2022-07-01: qty 20

## 2022-07-01 MED ORDER — HYDROMORPHONE HCL 1 MG/ML IJ SOLN
0.2500 mg | INTRAMUSCULAR | Status: DC | PRN
  Administered 2022-07-01 (×2): 0.5 mg via INTRAVENOUS

## 2022-07-01 MED ORDER — OXYCODONE HCL 5 MG PO TABS
5.0000 mg | ORAL_TABLET | ORAL | Status: DC | PRN
  Administered 2022-07-01 – 2022-07-03 (×6): 10 mg via ORAL
  Administered 2022-07-03 (×2): 5 mg via ORAL
  Administered 2022-07-03: 10 mg via ORAL
  Administered 2022-07-03 – 2022-07-05 (×4): 5 mg via ORAL
  Filled 2022-07-01 (×2): qty 2
  Filled 2022-07-01: qty 1
  Filled 2022-07-01: qty 2
  Filled 2022-07-01 (×2): qty 1
  Filled 2022-07-01: qty 2
  Filled 2022-07-01: qty 1
  Filled 2022-07-01 (×3): qty 2
  Filled 2022-07-01 (×2): qty 1

## 2022-07-01 MED ORDER — BUPIVACAINE LIPOSOME 1.3 % IJ SUSP
INTRAMUSCULAR | Status: AC
  Filled 2022-07-01: qty 20

## 2022-07-01 MED ORDER — ONDANSETRON HCL 4 MG/2ML IJ SOLN
INTRAMUSCULAR | Status: DC | PRN
  Administered 2022-07-01: 4 mg via INTRAVENOUS

## 2022-07-01 MED ORDER — MAGNESIUM OXIDE (ANTACID) 500 MG PO CAPS: 500.0000 mg | ORAL_CAPSULE | Freq: Every day | ORAL | Status: DC

## 2022-07-01 MED ORDER — HYDROMORPHONE HCL 1 MG/ML IJ SOLN
INTRAMUSCULAR | Status: AC
  Filled 2022-07-01: qty 1

## 2022-07-01 MED ORDER — IPRATROPIUM BROMIDE 0.06 % NA SOLN: 1.0000 | NASAL | Status: DC | PRN

## 2022-07-01 MED ORDER — METHYLPHENIDATE HCL 5 MG PO TABS
5.0000 mg | ORAL_TABLET | Freq: Two times a day (BID) | ORAL | Status: DC
  Administered 2022-07-02 – 2022-07-03 (×2): 5 mg via ORAL
  Filled 2022-07-01 (×5): qty 1

## 2022-07-01 MED ORDER — DEXAMETHASONE SODIUM PHOSPHATE 10 MG/ML IJ SOLN
INTRAMUSCULAR | Status: DC | PRN
  Administered 2022-07-01: 5 mg via INTRAVENOUS

## 2022-07-01 MED ORDER — HYDROMORPHONE HCL 1 MG/ML IJ SOLN
0.5000 mg | INTRAMUSCULAR | Status: DC | PRN
  Administered 2022-07-02: 0.5 mg via INTRAVENOUS
  Filled 2022-07-01: qty 0.5

## 2022-07-01 MED ORDER — VISTASEAL 10 ML SINGLE DOSE KIT
PACK | CUTANEOUS | Status: DC | PRN
  Administered 2022-07-01: 10 mL via TOPICAL

## 2022-07-01 MED ORDER — ACETAMINOPHEN 10 MG/ML IV SOLN
INTRAVENOUS | Status: DC | PRN
  Administered 2022-07-01: 1000 mg via INTRAVENOUS

## 2022-07-01 MED ORDER — CHLORHEXIDINE GLUCONATE 0.12 % MT SOLN
15.0000 mL | Freq: Once | OROMUCOSAL | Status: AC
  Administered 2022-07-01: 15 mL via OROMUCOSAL
  Filled 2022-07-01: qty 15

## 2022-07-01 MED ORDER — LACTATED RINGERS IV SOLN: INTRAVENOUS | Status: DC

## 2022-07-01 MED ORDER — ONDANSETRON 4 MG PO TBDP
4.0000 mg | ORAL_TABLET | Freq: Four times a day (QID) | ORAL | Status: DC | PRN
  Administered 2022-07-02: 4 mg via ORAL
  Filled 2022-07-01: qty 1

## 2022-07-01 MED ORDER — OXYCODONE HCL 5 MG PO TABS: 5.0000 mg | ORAL_TABLET | Freq: Once | ORAL | Status: DC | PRN

## 2022-07-01 MED ORDER — METHOCARBAMOL 500 MG PO TABS
500.0000 mg | ORAL_TABLET | Freq: Four times a day (QID) | ORAL | Status: DC | PRN
  Administered 2022-07-02 – 2022-07-05 (×7): 500 mg via ORAL
  Filled 2022-07-01 (×7): qty 1

## 2022-07-01 MED ORDER — FENTANYL CITRATE (PF) 250 MCG/5ML IJ SOLN
INTRAMUSCULAR | Status: DC | PRN
  Administered 2022-07-01: 50 ug via INTRAVENOUS
  Administered 2022-07-01: 150 ug via INTRAVENOUS

## 2022-07-01 MED ORDER — ONDANSETRON HCL 4 MG/2ML IJ SOLN: 4.0000 mg | Freq: Once | INTRAMUSCULAR | Status: DC | PRN

## 2022-07-01 MED ORDER — ROPINIROLE HCL 0.5 MG PO TABS
0.5000 mg | ORAL_TABLET | Freq: Every day | ORAL | Status: DC
  Administered 2022-07-01 – 2022-07-02 (×2): 0.5 mg via ORAL
  Filled 2022-07-01 (×4): qty 1

## 2022-07-01 MED ORDER — EPHEDRINE SULFATE-NACL 50-0.9 MG/10ML-% IV SOSY
PREFILLED_SYRINGE | INTRAVENOUS | Status: DC | PRN
  Administered 2022-07-01 (×2): 5 mg via INTRAVENOUS

## 2022-07-01 MED ORDER — ACETAMINOPHEN 10 MG/ML IV SOLN
INTRAVENOUS | Status: AC
  Filled 2022-07-01: qty 100

## 2022-07-01 MED ORDER — ROCURONIUM BROMIDE 10 MG/ML (PF) SYRINGE
PREFILLED_SYRINGE | INTRAVENOUS | Status: DC | PRN
  Administered 2022-07-01: 70 mg via INTRAVENOUS

## 2022-07-01 MED ORDER — MIDAZOLAM HCL 2 MG/2ML IJ SOLN
INTRAMUSCULAR | Status: AC
  Filled 2022-07-01: qty 2

## 2022-07-01 MED ORDER — LOSARTAN POTASSIUM 50 MG PO TABS
50.0000 mg | ORAL_TABLET | Freq: Two times a day (BID) | ORAL | Status: DC
  Administered 2022-07-02 – 2022-07-05 (×7): 50 mg via ORAL
  Filled 2022-07-01 (×8): qty 1

## 2022-07-01 MED ORDER — MAGNESIUM OXIDE -MG SUPPLEMENT 400 (240 MG) MG PO TABS
400.0000 mg | ORAL_TABLET | Freq: Every day | ORAL | Status: DC
  Filled 2022-07-01 (×3): qty 1

## 2022-07-01 MED ORDER — LOSARTAN POTASSIUM-HCTZ 50-12.5 MG PO TABS: 1.0000 | ORAL_TABLET | Freq: Two times a day (BID) | ORAL | Status: DC

## 2022-07-01 MED ORDER — SODIUM CHLORIDE (PF) 0.9 % IJ SOLN
INTRAMUSCULAR | Status: DC | PRN
  Administered 2022-07-01: 40 mL

## 2022-07-01 MED ORDER — LEVOTHYROXINE SODIUM 88 MCG PO TABS
88.0000 ug | ORAL_TABLET | Freq: Every morning | ORAL | Status: DC
  Administered 2022-07-02 – 2022-07-05 (×4): 88 ug via ORAL
  Filled 2022-07-01 (×4): qty 1

## 2022-07-01 MED ORDER — HYDROCHLOROTHIAZIDE 12.5 MG PO TABS
12.5000 mg | ORAL_TABLET | Freq: Two times a day (BID) | ORAL | Status: DC
  Administered 2022-07-02 – 2022-07-05 (×7): 12.5 mg via ORAL
  Filled 2022-07-01 (×8): qty 1

## 2022-07-01 MED ORDER — PROPOFOL 10 MG/ML IV BOLUS
INTRAVENOUS | Status: DC | PRN
  Administered 2022-07-01: 140 mg via INTRAVENOUS

## 2022-07-01 MED ORDER — 0.9 % SODIUM CHLORIDE (POUR BTL) OPTIME
TOPICAL | Status: DC | PRN
  Administered 2022-07-01: 1000 mL

## 2022-07-01 SURGICAL SUPPLY — 48 items
ADH SKN CLS APL DERMABOND .7 (GAUZE/BANDAGES/DRESSINGS) ×2
BAG COUNTER SPONGE SURGICOUNT (BAG) ×1 IMPLANT
BAG SPNG CNTER NS LX DISP (BAG) ×1
BINDER ABDOMINAL 12 ML 46-62 (SOFTGOODS) IMPLANT
BLADE SURG 11 STRL SS (BLADE) ×1 IMPLANT
COVER SURGICAL LIGHT HANDLE (MISCELLANEOUS) ×1 IMPLANT
DERMABOND ADVANCED .7 DNX12 (GAUZE/BANDAGES/DRESSINGS) IMPLANT
DEVICE TROCAR PUNCTURE CLOSURE (ENDOMECHANICALS) IMPLANT
DRAIN CHANNEL 19F RND (DRAIN) IMPLANT
DRAPE INCISE IOBAN 66X45 STRL (DRAPES) IMPLANT
DRAPE LAPAROSCOPIC ABDOMINAL (DRAPES) ×1 IMPLANT
DRSG OPSITE POSTOP 4X8 (GAUZE/BANDAGES/DRESSINGS) ×1 IMPLANT
ELECT REM PT RETURN 9FT ADLT (ELECTROSURGICAL) ×1
ELECTRODE REM PT RTRN 9FT ADLT (ELECTROSURGICAL) ×1 IMPLANT
EVACUATOR SILICONE 100CC (DRAIN) IMPLANT
GLOVE BIO SURGEON STRL SZ7.5 (GLOVE) ×2 IMPLANT
GLOVE BIOGEL PI IND STRL 8 (GLOVE) ×1 IMPLANT
GOWN STRL REUS W/ TWL LRG LVL3 (GOWN DISPOSABLE) ×1 IMPLANT
GOWN STRL REUS W/ TWL XL LVL3 (GOWN DISPOSABLE) ×1 IMPLANT
GOWN STRL REUS W/TWL LRG LVL3 (GOWN DISPOSABLE) ×3
GOWN STRL REUS W/TWL XL LVL3 (GOWN DISPOSABLE) ×1
HANDLE SUCTION POOLE (INSTRUMENTS) IMPLANT
KIT BASIN OR (CUSTOM PROCEDURE TRAY) ×1 IMPLANT
KIT TURNOVER KIT B (KITS) ×1 IMPLANT
MARKER SKIN DUAL TIP RULER LAB (MISCELLANEOUS) ×1 IMPLANT
MESH HERNIA 6X6 BARD (Mesh General) IMPLANT
NEEDLE HYPO 22GX1.5 SAFETY (NEEDLE) IMPLANT
NS IRRIG 1000ML POUR BTL (IV SOLUTION) ×1 IMPLANT
PACK GENERAL/GYN (CUSTOM PROCEDURE TRAY) ×1 IMPLANT
PAD ARMBOARD 7.5X6 YLW CONV (MISCELLANEOUS) ×2 IMPLANT
RETAINER VISCERA MED (MISCELLANEOUS) IMPLANT
SPONGE T-LAP 18X18 ~~LOC~~+RFID (SPONGE) IMPLANT
STAPLER VISISTAT 35W (STAPLE) IMPLANT
SUCTION POOLE HANDLE (INSTRUMENTS) ×1
SUT ETHILON 3 0 FSL (SUTURE) IMPLANT
SUT MNCRL AB 4-0 PS2 18 (SUTURE) IMPLANT
SUT NOVA 0 T19/GS 22DT (SUTURE) IMPLANT
SUT NOVA NAB GS-21 0 18 T12 DT (SUTURE) IMPLANT
SUT PDS AB 0 CT 36 (SUTURE) IMPLANT
SUT PDS AB 1 TP1 54 (SUTURE) IMPLANT
SUT VIC AB 2-0 SH 18 (SUTURE) IMPLANT
SUT VICRYL AB 2 0 TIES (SUTURE) ×1 IMPLANT
SYR CONTROL 10ML LL (SYRINGE) IMPLANT
TOWEL GREEN STERILE (TOWEL DISPOSABLE) ×1 IMPLANT
TOWEL GREEN STERILE FF (TOWEL DISPOSABLE) ×1 IMPLANT
TRAY FOLEY W/BAG SLVR 16FR (SET/KITS/TRAYS/PACK) ×1
TRAY FOLEY W/BAG SLVR 16FR ST (SET/KITS/TRAYS/PACK) IMPLANT
WATER STERILE IRR 1000ML POUR (IV SOLUTION) ×1 IMPLANT

## 2022-07-01 NOTE — Anesthesia Postprocedure Evaluation (Signed)
Anesthesia Post Note  Patient: [name]  Procedure(s) Performed: OPEN INCISIONAL HERNIA REPAIR, TAR PROCEDURE (Abdomen) INSERTION OF MESH (Abdomen)     Patient location during evaluation: PACU Anesthesia Type: General Level of consciousness: awake and alert and oriented Pain management: pain level controlled Vital Signs Assessment: post-procedure vital signs reviewed and stable Respiratory status: spontaneous breathing, nonlabored ventilation and respiratory function stable Cardiovascular status: blood pressure returned to baseline and stable Postop Assessment: no apparent nausea or vomiting Anesthetic complications: no   No notable events documented.  Last Vitals:  Vitals:   07/01/22 1000 07/01/22 1015  BP: (!) 149/88 (!) 149/72  Pulse: 73 70  Resp: 17 13  Temp:    SpO2: 99% 99%    Last Pain:  Vitals:   07/01/22 1000  TempSrc:   PainSc: 8                  Madelena Maturin A.

## 2022-07-01 NOTE — Discharge Instructions (Signed)
[facility] _______[facility]   HERNIA REPAIR: POST OP INSTRUCTIONS lways review your discharge instruction sheet given to you by the facility where your surgery was performed. IF YOU HAVE DISABILITY OR FAMILY LEAVE FORMS, YOU MUST BRING THEM TO THE OFFICE FOR PROCESSING.   DO NOT GIVE THEM TO YOUR DOCTOR.  1. A  prescription for pain medication may be given to you upon discharge.  Take your pain medication as prescribed, if needed.  If narcotic pain medicine is not needed, then you may take acetaminophen (Tylenol) or ibuprofen (Advil) as needed. 2. Take your usually prescribed medications unless otherwise directed. If you need a refill on your pain medication, please contact your pharmacy.  They will contact our office to request authorization. Prescriptions will not be filled after 5 pm or on week-ends. 3. You should follow a light diet the first 24 hours after arrival home, such as soup and crackers, etc.  Be sure to include lots of fluids daily.  Resume your normal diet the day after surgery. 4.Most patients will experience some swelling and bruising around the umbilicus or in the groin and scrotum.  Ice packs and reclining will help.  Swelling and bruising can take several days to resolve.  6. It is common to experience some constipation if taking pain medication after surgery.  Increasing fluid intake and taking a stool softener (such as Colace) will usually help or prevent this problem from occurring.  A mild laxative (Milk of Magnesia or Miralax) should be taken according to package directions if there are no bowel movements after 48 hours. 7. Unless discharge instructions indicate otherwise, you may remove your bandages 24-48 hours after surgery, and you may shower at that time.  You may have steri-strips (small skin tapes) in place directly over the incision.  These strips should be left on the skin for 7-10 days.  If your surgeon used skin glue on the incision, you may shower in 24  hours.  The glue will flake off over the next 2-3 weeks.  Any sutures or staples will be removed at the office during your follow-up visit. 8. ACTIVITIES:  You may resume regular (light) daily activities beginning the next day--such as daily self-care, walking, climbing stairs--gradually increasing activities as tolerated.  You may have sexual intercourse when it is comfortable.  Refrain from any heavy lifting or straining until approved by your doctor.  a.You may drive when you are no longer taking prescription pain medication, you can comfortably wear a seatbelt, and you can safely maneuver your car and apply brakes. b.RETURN TO WORK:   _____________________________________________  9.You should see your doctor in the office for a follow-up appointment approximately 2-3 weeks after your surgery.  Make sure that you call for this appointment within a day or two after you arrive home to insure a convenient appointment time. 10.OTHER INSTRUCTIONS: _________________________    _____________________________________  WHEN TO CALL YOUR DOCTOR: Fever over 101.0 Inability to urinate Nausea and/or vomiting Extreme swelling or bruising Continued bleeding from incision. Increased pain, redness, or drainage from the incision  The clinic staff is available to answer your questions during regular business hours.  Please don't hesitate to call and ask to speak to one of the nurses for clinical concerns.  If you have a medical emergency, go to the nearest emergency room or call 911.  A surgeon from [facility] is always on call at the hospital   [address], [place], [place]  [address] ?  [address] [address], [place], [place]   [address] [phone] ? [phone] ? FAX [phone] Web site: [email or web address]  

## 2022-07-01 NOTE — TOC Initial Note (Deleted)
Transition of Care [facility]) - Initial/Assessment Note    Patient Details  Name: [name] MRN: [id number] Date of Birth: 01/16/1946  Transition of Care [facility]) CM/SW Contact:    Lockie Pares, RN Phone Number: 07/01/2022, 12:06 PM  Clinical Narrative:                 The Transition of Care Department [facility]) has reviewed patient and no TOC needs have been identified at this time. We will continue to monitor patient advancement through interdisciplinary progression rounds. If new patient transition needs arise, please place a TOC consult         Patient Goals and CMS Choice            Expected Discharge Plan and Services                                              Prior Living Arrangements/Services                       Activities of Daily Living Home Assistive Devices/Equipment: Cane (specify quad or straight) ADL Screening (condition at time of admission) Patient's cognitive ability adequate to safely complete daily activities?: Yes Is the patient deaf or have difficulty hearing?: Yes Does the patient have difficulty seeing, even when wearing glasses/contacts?: Yes Does the patient have difficulty concentrating, remembering, or making decisions?: Yes Patient able to express need for assistance with ADLs?: Yes Does the patient have difficulty dressing or bathing?: Yes Independently performs ADLs?: Yes (appropriate for developmental age) Does the patient have difficulty walking or climbing stairs?: Yes Weakness of Legs: Both Weakness of Arms/Hands: Both  Permission Sought/Granted                  Emotional Assessment              Admission diagnosis:  S/P hernia repair [X83.338, Z87.19] Patient Active Problem List   Diagnosis Date Noted   S/P hernia repair 07/01/2022   Chronic cough 02/22/2020   Bursitis of right shoulder 05/24/2019   Pure hypercholesterolemia 02/18/2019   PONV (postoperative nausea and vomiting)     Migraine headaches    History of pleural effusion    History of endometriosis    GERD (gastroesophageal reflux disease)    Chronic kidney disease    CAD (coronary artery disease)    Acute vestibular neuronitis    Abscess of Bartholin's gland    ADHD (attention deficit hyperactivity disorder)    Vertigo 01/26/2017   Hyperlipidemia LDL goal <70 06/24/2016   PTSD (post-traumatic stress disorder)    Major depressive disorder    PVC's (premature ventricular contractions) 05/31/2013   Diastolic dysfunction, left ventricle 05/31/2013   PAT (paroxysmal atrial tachycardia) 05/31/2013   Essential hypertension 05/31/2013   Obesity 05/31/2013   Generalized anxiety disorder    Renal mass 02/15/2012   History of gall stones 12/31/2009   Obstructive sleep apnea 02/20/2007   Restless legs syndrome (RLS) 02/20/2007   Acquired hypothyroidism 03/22/1988   PCP:  Daisy Floro, MD Pharmacy:   [id number] [place], [facility] - [address] [place] [address] Phone: [phone] Fax: [phone]     Social Determinants of Health (SDOH) Social History: SDOH Screenings   Food Insecurity: No Food Insecurity (07/01/2022)  Housing: Low Risk  (07/01/2022)  Transportation Needs: No  Transportation Needs (07/01/2022)  Utilities: Not At Risk (07/01/2022)  Alcohol Screen: Low Risk  (05/25/2018)  Depression (PHQ2-9): Medium Risk (10/14/2020)  Financial Resource Strain: Low Risk  (11/26/2021)  Physical Activity: Inactive (11/26/2021)  Stress: Stress Concern Present (01/13/2022)  Tobacco Use: Low Risk  (07/01/2022)   SDOH Interventions: Housing Interventions: Intervention Not Indicated   Readmission Risk Interventions     No data to display

## 2022-07-01 NOTE — Interval H&P Note (Signed)
History and Physical Interval Note:  07/01/2022 7:22 AM  [name]  has presented today for surgery, with the diagnosis of INCISIONAL HERNIA.  The various methods of treatment have been discussed with the patient and family. After consideration of risks, benefits and other options for treatment, the patient has consented to  Procedure(s): OPEN INCISIONAL HERNIA REPAIR WITH MESH (N/A) as a surgical intervention.  The patient's history has been reviewed, patient examined, no change in status, stable for surgery.  I have reviewed the patient's chart and labs.  Questions were answered to the patient's satisfaction.     Axel Filler

## 2022-07-01 NOTE — Progress Notes (Signed)
Dr. Judie Petit. Foster made aware of patient's BMP result- potassium 3.0. No new orders received at this time.

## 2022-07-01 NOTE — Progress Notes (Signed)
Pt req to have DNR status changed to FULL CODE. Dr. contacted via secure chat to have the status updated.

## 2022-07-01 NOTE — Op Note (Signed)
07/01/2022  9:21 AM  PATIENT:  [name]  77 y.o. female  PRE-OPERATIVE DIAGNOSIS:  INCARCERATED INCISIONAL HERNIA 10x8cm  POST-OPERATIVE DIAGNOSIS:  INCARCERATED  INCISIONAL HERNIA 10x8cm  PROCEDURE:  Procedure(s): OPEN INCISIONAL HERNIA REPAIR, BILATERAL RETRORECTUS  INSERTION OF MESH 12X15CM (N/A)  SURGEON:  Surgeon(s) and Role:    * Axel Filler, MD - Primary  ASSISTANTS: Jeronimo Greaves, RNFA   ANESTHESIA:   local and general  EBL:  minimal   BLOOD ADMINISTERED:none  DRAINS: none   LOCAL MEDICATIONS USED:  OTHER exparel  SPECIMEN:  No Specimen  DISPOSITION OF SPECIMEN:  N/A  COUNTS:  YES  TOURNIQUET:  * No tourniquets in log *  DICTATION: .Dragon Dictation  After the patient was consented he was taken back to the operating room and placed in the supine position with bilateral SCDs in place. The patient was prepped and draped in usual sterile fashion. Antibiotics were confirmed and timeout was called and all facts verified.  A midline incision was made. I proceeded to use electrocautery to maintain hemostasis and dissection took place in the most superior portion of the hernia down to the subcutaneous tissues fat to the anterior fascia. This was incised. The fascia was elevated and 2 Kocher clamps.  The hernia sac and the abdominal cavity were entered bluntly. There appeared to be some omentum within the midline in the superior portion of the wound. This was carefully dissected away from the abdominal wall. The omentum lay in the midline, this was taken down with blunt and sharp dissection circumferentially to the incision.  There was also some small intestine that was adherent to the midline fascia.  This was sharply taken down. The fascia was incised distal to the hernia approximately 4 cm.  The hernia was seen to be approximately 10 cm in legnth.   The hernia sacs were excised.  There were minimal interior adhesions.  At this time I proceeded to retract is  rectus muscles medially.  At this time the posterior fascia was then incised.  Using blunt dissection the belly was dissected away from the posterior rectus fascia.  This was done inferiorly and superiorly.  At the midline inferior and superior portions of the linea alba this was taken down from the anterior abdominal wall.  This was done bilaterally.   This allowed me to advance the muscle flaps towards the midline bilaterally.  The area was checked for hemostasis.  At this time the posterior rectus fascia was reapproximated using #1 PDS in a standard running fashion x2.  I proceeded to irrigate out the retrorectus space.  The area was measured and seemed to be approximately 15x12 cm in size.  A piece of Bard mesh was selected and placed into the retrorectus space.  This fit well and flat. Transfascia sutures were placed x 2 bilaterally and two superiorly and one inferiorly. Tisseel glue was then used to fasten the mesh to the midline fascia.  At this time the anterior fascia and midline were reapproximated using #1 PDS in a standard running fashion x2.  The subcutaneous tissue was irrigated out with sterile saline.  The sq layer was reapproximated with 2-0 vicryls.  The skin was then reapproximated using 3-0 Monocryl in subcuticular fashion.  The midline wound was then dressed with honeycomb dressing.  The patient tolerated procedure well was taken to the recovery room in stable condition.  PLAN OF CARE: Admit for overnight observation  PATIENT DISPOSITION:  PACU - hemodynamically stable.   Delay  start of Pharmacological VTE agent (>24hrs) due to surgical blood loss or risk of bleeding: yes

## 2022-07-01 NOTE — Transfer of Care (Signed)
Immediate Anesthesia Transfer of Care Note  Patient: [name]  Procedure(s) Performed: OPEN INCISIONAL HERNIA REPAIR, TAR PROCEDURE (Abdomen) INSERTION OF MESH (Abdomen)  Patient Location: PACU  Anesthesia Type:General  Level of Consciousness: drowsy  Airway & Oxygen Therapy: Patient Spontanous Breathing and Patient connected to nasal cannula oxygen  Post-op Assessment: Report given to RN and Post -op Vital signs reviewed and stable  Post vital signs: Reviewed and stable  Last Vitals:  Vitals Value Taken Time  BP 142/67 07/01/22 0945  Temp    Pulse 69 07/01/22 0946  Resp 13 07/01/22 0947  SpO2 100 % 07/01/22 0946  Vitals shown include unvalidated device data.  Last Pain:  Vitals:   07/01/22 0613  TempSrc:   PainSc: 0-No pain      Patients Stated Pain Goal: 0 (07/01/22 0388)  Complications: No notable events documented.

## 2022-07-01 NOTE — Anesthesia Procedure Notes (Signed)
Procedure Name: Intubation Date/Time: 07/01/2022 7:35 AM  Performed by: Samara Deist, CRNAPre-anesthesia Checklist: Patient identified, Emergency Drugs available, Suction available, Patient being monitored and Timeout performed Patient Re-evaluated:Patient Re-evaluated prior to induction Oxygen Delivery Method: Circle system utilized Preoxygenation: Pre-oxygenation with 100% oxygen Induction Type: IV induction Ventilation: Mask ventilation without difficulty Laryngoscope Size: Glidescope and 4 Grade View: Grade II Tube type: Oral Tube size: 7.0 mm Number of attempts: 1 Airway Equipment and Method: Stylet and Video-laryngoscopy Placement Confirmation: ETT inserted through vocal cords under direct vision, positive ETCO2 and breath sounds checked- equal and bilateral Secured at: 21 cm Tube secured with: Tape Dental Injury: Teeth and Oropharynx as per pre-operative assessment

## 2022-07-02 ENCOUNTER — Encounter (HOSPITAL_COMMUNITY): Payer: Self-pay | Admitting: General Surgery

## 2022-07-02 LAB — CBC
HCT: 37.9 % (ref 36.0–46.0)
Hemoglobin: 12.8 g/dL (ref 12.0–15.0)
MCH: 28.4 pg (ref 26.0–34.0)
MCHC: 33.8 g/dL (ref 30.0–36.0)
MCV: 84.2 fL (ref 80.0–100.0)
Platelets: 221 10*3/uL (ref 150–400)
RBC: 4.5 MIL/uL (ref 3.87–5.11)
RDW: 14 % (ref 11.5–15.5)
WBC: 9.2 10*3/uL (ref 4.0–10.5)
nRBC: 0 % (ref 0.0–0.2)

## 2022-07-02 MED ORDER — DOCUSATE SODIUM 100 MG PO CAPS
100.0000 mg | ORAL_CAPSULE | Freq: Two times a day (BID) | ORAL | Status: DC | PRN
  Administered 2022-07-02 – 2022-07-03 (×4): 100 mg via ORAL
  Filled 2022-07-02 (×4): qty 1

## 2022-07-02 MED ORDER — OXYCODONE HCL 5 MG PO TABS: 5.0000 mg | ORAL_TABLET | ORAL | 0 refills | Status: DC | PRN

## 2022-07-02 MED ORDER — METHOCARBAMOL 500 MG PO TABS: 500.0000 mg | ORAL_TABLET | Freq: Four times a day (QID) | ORAL | 0 refills | Status: DC | PRN

## 2022-07-02 NOTE — Discharge Summary (Signed)
Physician Discharge Summary  Patient ID: [name] MRN: [id number] DOB/AGE: Jun 21, 1945 77 y.o.  Admit date: 07/01/2022 Discharge date: 07/05/2022  Admission Diagnoses:hernia repair  Discharge Diagnoses:  Principal Problem:   S/P hernia repair   Discharged Condition: good  Hospital Course: Pt was admitted post op.  Please see op note for full details.  Pt did well post op.  She had good pain control and was amb well on her own.  Pt did have swallowing issues.  SLP evald pt and found no issues.  She was afebrile, deemed stable for DC and Dc'd home.   Consults: None  Significant Diagnostic Studies: none  Treatments: surgery:  as above  Discharge Exam: Blood pressure 104/64, pulse 65, temperature 98.4 F (36.9 C), temperature source Oral, resp. rate 16, height 5\' 4"  (1.626 m), weight 99.8 kg, SpO2 99 %. General appearance: alert and cooperative GI: soft, non-tender; bowel sounds normal; no masses,  no organomegaly and inc c/d/i  Disposition: Discharge disposition: 01-Home or Self Care       Discharge Instructions     Diet - low sodium heart healthy   Complete by: As directed    Increase activity slowly   Complete by: As directed       Allergies as of 07/02/2022       Reactions   Geodon [ziprasidone Hydrochloride] Other (See Comments)   Extremely agitated   Lithium Nausea Only, Other (See Comments)   Off balance, increased heart rate   Talwin [pentazocine] Other (See Comments)   Hallucinations    Toradol [ketorolac Tromethamine] Other (See Comments)   Chest pains   Abilify [aripiprazole] Other (See Comments)   jerking   Compazine [prochlorperazine Edisylate] Nausea And Vomiting   Latuda [lurasidone Hcl] Other (See Comments)   Reports made her mind race more and made her irritable    Pristiq [desvenlafaxine Succinate Er] Other (See Comments)   Did not work, prefers not to take   Rexulti [brexpiprazole] Other (See Comments), Hypertension   Elevated BP,  created aggression   Diflucan [fluconazole] Nausea And Vomiting        Medication List     TAKE these medications    acetaminophen 500 MG tablet Commonly known as: TYLENOL Take 500-1,000 mg by mouth as needed for mild pain (or headaches).   ARTIFICIAL TEAR OP Place 2 drops into both eyes daily as needed (dryness).   B COMPLEX PO Take 1 mL by mouth daily.   diphenhydrAMINE 25 mg capsule Commonly known as: BENADRYL Take 25 mg by mouth as needed for allergies.   DULoxetine 30 MG capsule Commonly known as: CYMBALTA Take 30 mg by mouth 2 (two) times daily.   ezetimibe 10 MG tablet Commonly known as: ZETIA Take 1 tablet (10 mg total) by mouth daily.   FISH OIL PO Take 1 capsule by mouth daily.   ipratropium 0.06 % nasal spray Commonly known as: ATROVENT Place 1-2 sprays into both nostrils as needed for rhinitis.   levothyroxine 88 MCG tablet Commonly known as: SYNTHROID Take 88 mcg by mouth every morning.   losartan-hydrochlorothiazide 50-12.5 MG tablet Commonly known as: HYZAAR TAKE 1 TABLET BY MOUTH TWICE A DAY   Magnesium Oxide (Antacid) 500 MG Caps Take 500 mg by mouth daily.   methocarbamol 500 MG tablet Commonly known as: ROBAXIN Take 1 tablet (500 mg total) by mouth every 6 (six) hours as needed for muscle spasms.   methylphenidate 5 MG tablet Commonly known as: RITALIN Take 5 mg by  mouth 2 (two) times daily.   metoprolol tartrate 25 MG tablet Commonly known as: LOPRESSOR Take 25 mg by mouth daily.   Mineral Ice 2 % Gel Generic drug: Menthol (Topical Analgesic) Apply 1 application  topically in the morning and at bedtime.   oxyCODONE 5 MG immediate release tablet Commonly known as: Oxy IR/ROXICODONE Take 1-2 tablets (5-10 mg total) by mouth every 4 (four) hours as needed for moderate pain.   promethazine 25 MG tablet Commonly known as: PHENERGAN Take 25 mg by mouth as needed for nausea or vomiting.   rOPINIRole 0.5 MG tablet Commonly known  as: REQUIP TAKE 1 TABLET BY MOUTH EVERY DAY AFTER SUPPER AND TAKE 2 TABLETS AT BEDTIME What changed: See the new instructions.   rosuvastatin 10 MG tablet Commonly known as: CRESTOR Take 10 mg by mouth daily.   rosuvastatin 5 MG tablet Commonly known as: CRESTOR Take 1 tablet (5 mg total) by mouth daily.   UNABLE TO FIND Take 60 mg by mouth at bedtime. Med Name: Delta 8        Follow-up Information     Axel Filler, MD. Schedule an appointment as soon as possible for a visit in 3 week(s).   Specialty: General Surgery Why: Post op visit Contact information: [address] [place] [address] [place] [address] [phone]                 Signed: Axel Filler 07/02/2022, 6:35 AM

## 2022-07-02 NOTE — Progress Notes (Signed)
Patient says she can not leave today, because no one will be home to help her. Reached out to MD

## 2022-07-02 NOTE — TOC CM/SW Note (Signed)
Screening note The Transition of Care Department [facility]) has reviewed patient and no TOC needs have been identified at this time. We will continue to monitor patient advancement through interdisciplinary progression rounds. If new patient transition needs arise, please place a TOC consult

## 2022-07-02 NOTE — Progress Notes (Signed)
Pt having a difficult time swallowing food. Notified MD

## 2022-07-03 MED ORDER — ROPINIROLE HCL 0.5 MG PO TABS
0.5000 mg | ORAL_TABLET | Freq: Once | ORAL | Status: AC
  Administered 2022-07-03: 0.5 mg via ORAL

## 2022-07-03 MED ORDER — PHENOL 1.4 % MT LIQD
1.0000 | OROMUCOSAL | Status: DC | PRN
  Filled 2022-07-03: qty 177

## 2022-07-03 NOTE — Progress Notes (Signed)
Assessment & Plan: POD#2 - status post lap ventral incisional hernia repair with mesh - Dr. Derrell Lolling, 07/01/2022  Post op pain - Rx  Complains unable to swallow crackers, pills - recent hx of choking episode  Await SLP evaluation  Encouraged OOB, ambulation.  Home when able to tolerate diet.        [name], MD [facility] practice Office: [phone]        Chief Complaint: Incisional hernia  Subjective: Patient in bed, complains of pain.  Objective: Vital signs in last 24 hours: Temp:  [98.1 F (36.7 C)-98.9 F (37.2 C)] 98.9 F (37.2 C) (04/13 0802) Pulse Rate:  [83-96] 88 (04/13 0802) Resp:  [16-17] 17 (04/13 0802) BP: (92-138)/(55-72) 92/55 (04/13 0802) SpO2:  [94 %-100 %] 94 % (04/13 0802) Last BM Date : 07/01/22  Intake/Output from previous day: No intake/output data recorded. Intake/Output this shift: No intake/output data recorded.  Physical Exam: HEENT - sclerae clear, mucous membranes moist Neck - soft Abdomen - soft, obese; midline wound dry and intact with Honeycomb dressing; small serosanguinous from inferior suture wound Neuro - alert & oriented, no focal deficits  Lab Results:  Recent Labs    07/02/22 0057  WBC 9.2  HGB 12.8  HCT 37.9  PLT 221   BMET Recent Labs    07/01/22 0600  NA 136  K 3.0*  CL 99  CO2 27  GLUCOSE 128*  BUN 20  CREATININE 1.14*  CALCIUM 9.8   PT/INR No results for input(s): "LABPROT", "INR" in the last 72 hours. Comprehensive Metabolic Panel:    Component Value Date/Time   NA 136 07/01/2022 0600   NA 139 04/16/2022 1415   NA 139 10/20/2021 1613   NA 141 08/04/2020 1059   NA 139 02/18/2015 1440   NA 141 02/07/2014 1401   K 3.0 (L) 07/01/2022 0600   K 3.7 04/16/2022 1415   K 4.2 02/18/2015 1440   K 3.9 02/07/2014 1401   CL 99 07/01/2022 0600   CL 102 04/16/2022 1415   CL 103 07/21/2012 1415   CL 103 02/15/2012 1029   CO2 27 07/01/2022 0600   CO2 28 04/16/2022 1415    CO2 29 02/18/2015 1440   CO2 26 02/07/2014 1401   BUN 20 07/01/2022 0600   BUN 22 04/16/2022 1415   BUN 14 10/20/2021 1613   BUN 39 (H) 08/04/2020 1059   BUN 19.1 02/18/2015 1440   BUN 16.4 02/07/2014 1401   CREATININE 1.14 (H) 07/01/2022 0600   CREATININE 1.10 (H) 04/16/2022 1415   CREATININE 0.9 02/18/2015 1440   CREATININE 0.8 02/07/2014 1401   GLUCOSE 128 (H) 07/01/2022 0600   GLUCOSE 106 (H) 04/16/2022 1415   GLUCOSE 73 02/18/2015 1440   GLUCOSE 97 02/07/2014 1401   GLUCOSE 106 (H) 07/21/2012 1415   GLUCOSE 113 (H) 02/15/2012 1029   CALCIUM 9.8 07/01/2022 0600   CALCIUM 9.6 04/16/2022 1415   CALCIUM 10.2 02/18/2015 1440   CALCIUM 9.9 02/07/2014 1401   AST 23 04/16/2022 1415   AST 22 01/13/2022 1915   AST 20 02/18/2015 1440   AST 19 02/07/2014 1401   ALT 22 04/16/2022 1415   ALT 24 01/13/2022 1915   ALT 16 02/18/2015 1440   ALT 17 02/07/2014 1401   ALKPHOS 68 04/16/2022 1415   ALKPHOS 69 01/13/2022 1915   ALKPHOS 89 02/18/2015 1440   ALKPHOS 92 02/07/2014 1401   BILITOT 0.3 04/16/2022 1415   BILITOT 0.9  01/13/2022 1915   BILITOT 0.4 10/20/2021 1613   BILITOT 0.37 02/18/2015 1440   BILITOT 0.38 02/07/2014 1401   PROT 6.9 04/16/2022 1415   PROT 7.8 01/13/2022 1915   PROT 6.5 10/20/2021 1613   PROT 7.4 02/18/2015 1440   PROT 7.2 02/07/2014 1401   ALBUMIN 3.9 04/16/2022 1415   ALBUMIN 4.4 01/13/2022 1915   ALBUMIN 4.1 10/20/2021 1613   ALBUMIN 3.8 02/18/2015 1440   ALBUMIN 3.8 02/07/2014 1401    Studies/Results: No results found.    [name] 07/03/2022  Patient ID: [name], female   DOB: 1946/02/20, 77 y.o.   MRN: [id number]

## 2022-07-03 NOTE — Evaluation (Signed)
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 FFR of LAD  not performed because of difficulty with catheter control from the right radial.    Chronic cough 02/22/2020   Chronic kidney disease    hx of kidney cancer   Diastolic dysfunction, left ventricle 05/31/2013   Difficult intubation    told by [facility] that she was hard to intubate 15 yrs ago in [place]- surgery since then no problems   Dyspnea    Essential hypertension 05/31/2013   Fibromyalgia    Generalized anxiety disorder    Adequate for discharge    GERD (gastroesophageal reflux disease)    History of attention deficit disorder    History of echocardiogram 07/2012   Normal LVF w grade I siastolic dysfunction    History of endometriosis    History of gall stones 12/31/2009   History of pleural effusion    Hyperlipidemia 06/24/2016   LDL goal <70   Hypertension    Hypothyroidism 1990   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 Vassie Loll discussed inspire device, patient would need to lose weight   PAT (paroxysmal atrial tachycardia)    s/p ablation   PONV (postoperative nausea and vomiting)    Pre-diabetes    PTSD (post-traumatic stress disorder)    Pure hypercholesterolemia 02/18/2019   PVC's (premature ventricular contractions)    Renal mass 02/15/2012   Restless legs syndrome (RLS) 02/20/2007   Vertigo    Past Surgical History:  Past Surgical History:  Procedure Laterality Date   CARDIAC ELECTROPHYSIOLOGY MAPPING AND ABLATION  2000s   INCISIONAL HERNIA REPAIR N/A 07/01/2022   Procedure: OPEN INCISIONAL HERNIA REPAIR, TAR PROCEDURE;  Surgeon: Axel Filler, MD;  [facility] OR;  Service: General;  Laterality: N/A;   INSERTION OF MESH N/A 07/01/2022   Procedure: INSERTION OF MESH;  Surgeon: Axel Filler, MD;  Location: [facility] OR;  Service: General;  Laterality: N/A;   LAPAROSCOPIC NEPHRECTOMY, HAND ASSISTED Right 07/31/2019   Procedure: HAND ASSISTED LAPAROSCOPIC NEPHRECTOMY CONVERTED TO OPEN WITH REPAIR OF INFERIOR VENA CAVAOTOMY;  Surgeon: Noel Christmas, MD;  Location: [facility] ORS;  Service: Urology;  Laterality: Right;  3 HRS   laparoscopy     LEFT HEART CATH AND CORONARY ANGIOGRAPHY N/A 06/08/2016   Procedure: Left Heart Cath and Coronary Angiography;  Surgeon:  Lyn Records, MD;  Location: [facility] INVASIVE CV LAB;  Service: Cardiovascular;  Laterality: N/A;   nasoseptal reconstruction  1990s   NEPHRECTOMY RECIPIENT Right 08/2019   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   THYROIDECTOMY  1990   TONSILLECTOMY     as a child   TUBAL LIGATION  1970s   uterine mass removal  03/2012   was found to be benign   HPI:  Pt is a 77 y.o. female who presented 07/01/22 for surgery, with the diagnosis of Incisional Hernia and admitted post op. On 4/12, RN progress note reports  "Pt having a difficult time swallowing food. Notified MD". MD note 4/13 further states, "Complains unable to swallow crackers, pills - recent hx of choking episode". Pt to d/c home pending swallow eval/toleration of diet. PMH: anxiety, arthritis, CKD, hx cancer, HTN, sleep apnea, thyroid disease, GERD.    Assessment / Plan / Recommendation  Clinical Impression  Pt presents with functional oropharyngeal swallow at bedside this am. She reports consuming a regular diet/thin liquids at PLOF, avoiding tough meats due to missing mandibular dentition causing difficulty with mastication. She stated she had x1 episode of choking on toasted sandwich ~1 week ago, with heimlich performed. She reported scrambled eggs got "stuck in throat" a few days ago, but she was not choking. Otherwise, she denies any hx of difficulty swallowing. Oral mechanism examination unremarkable and she has several missing lower dentition as stated previously. Sitting upright at EOB, she consumed whole pills with water, single/consecutive straw sips of thin liquids, bites of puree and regular textures exhibiting x1 brief throat clear post solid trials. No other s/sx of aspiration and pt denied pain with swallowing or globus sensation. Oral clearance of solids complete. She independently took small bites/sips, consumed at a slow rate and alternated solids with liquids as needed. Recommend continue regular diet/thin liquids as pt prefers this and is capable of choosing meal items (ie. meats) that are softer for ease of mastication when needed. Reviewed universal swallow precautions, which pt demonstrated understanding of. If episodes of choking/difficulty swallowing persist/worsen, can reconsult SLP vs GI (question possible esophageal component given hx of GERD). Otherwise, swallow appears functional at this time per this clinical eval. No SLP f/u warranted at this time.  SLP Visit Diagnosis: Dysphagia, unspecified (R13.10)    Aspiration Risk   Mild aspiration risk    Diet Recommendation Regular;Thin liquid   Liquid Administration via: Cup;Straw Medication Administration: Whole meds with liquid Supervision: Patient able to self feed Compensations: Slow rate;Small sips/bites;Follow solids with liquid Postural Changes: Seated upright at 90 degrees;Remain upright for at least 30 minutes after po intake    Other  Recommendations Recommended Consults:  (Consider esophageal assessment if has recurrent episodes and/or s/sx of GERD) Oral Care Recommendations: Oral care BID    Recommendations for follow up therapy are one component of a multi-disciplinary discharge planning process, led by the attending physician.  Recommendations may be updated based on patient status, additional functional criteria and insurance authorization.  Follow up Recommendations No SLP follow up      Assistance Recommended at Discharge    Functional Status Assessment Patient has not had a recent decline in their functional status  Frequency and Duration            Prognosis        Swallow Study   General Date of Onset: 07/02/22 HPI: Pt is a 77 y.o. female who presented 07/01/22 for surgery, with the diagnosis of Incisional Hernia  and admitted post op. On 4/12, RN progress note reports "Pt having a difficult time swallowing food. Notified MD". MD note 4/13 further states, "Complains unable to swallow crackers, pills - recent hx of choking episode". Pt to d/c home pending swallow eval/toleration of diet. PMH: anxiety, arthritis, CKD, hx cancer, HTN, sleep apnea, thyroid disease, GERD. Type of Study: Bedside Swallow Evaluation Previous Swallow Assessment: none per EMR Diet Prior to this Study: Regular;Thin liquids (Level 0) Temperature Spikes Noted: No Respiratory Status: Room air History of Recent Intubation: No Behavior/Cognition: Alert;Cooperative;Pleasant mood Oral Cavity Assessment: Within Functional Limits Oral Care Completed by SLP: No Oral  Cavity - Dentition: Missing dentition;Adequate natural dentition Vision: Functional for self-feeding Self-Feeding Abilities: Able to feed self Patient Positioning: Postural control adequate for testing (upright at EOB) Baseline Vocal Quality: Normal Volitional Cough: Strong Volitional Swallow: Able to elicit    Oral/Motor/Sensory Function Overall Oral Motor/Sensory Function: Within functional limits   Ice Chips Ice chips: Not tested   Thin Liquid Thin Liquid: Within functional limits Presentation: Self Fed;Straw;Cup    Nectar Thick Nectar Thick Liquid: Not tested   Honey Thick Honey Thick Liquid: Not tested   Puree Puree: Within functional limits Presentation: Self Fed;Spoon   Solid     Solid: Within functional limits Presentation: Self Fed       Avie Echevaria, MA, CCC-SLP Acute Rehabilitation Services Office Number: [phone]  Paulette Blanch 07/03/2022,9:45 AM

## 2022-07-04 MED ORDER — BISACODYL 10 MG RE SUPP
10.0000 mg | Freq: Once | RECTAL | Status: AC
  Administered 2022-07-04: 10 mg via RECTAL
  Filled 2022-07-04: qty 1

## 2022-07-04 MED ORDER — POLYETHYLENE GLYCOL 3350 17 G PO PACK
17.0000 g | PACK | Freq: Two times a day (BID) | ORAL | Status: DC
  Filled 2022-07-04: qty 1

## 2022-07-04 MED ORDER — POLYETHYLENE GLYCOL 3350 17 G PO PACK
17.0000 g | PACK | Freq: Two times a day (BID) | ORAL | Status: DC
  Administered 2022-07-04: 17 g via ORAL
  Filled 2022-07-04: qty 1

## 2022-07-04 NOTE — Progress Notes (Signed)
SLP Cancellation Note  Patient Details Name: [name] MRN: [id number] DOB: 1945/04/05   Cancelled treatment:       Reason Eval/Treat Not Completed: Other (comment) (Evaluation already completed on 4/13 with rx for reg/thin. Per RN there has been no change in patient status since evaluation and she has no concerns for trouble swallowing.  If we can be of further assistance please reconsult.)  Ordering provider was sent a message through secure chat.     Dimas Aguas, MA, CCC-SLP Acute Rehab SLP [phone]  Fleet Contras 07/04/2022, 10:58 AM

## 2022-07-04 NOTE — Progress Notes (Signed)
Assessment & Plan: POD#3 - status post lap ventral incisional hernia repair with mesh - Dr. Derrell Lolling, 07/01/2022             Post op pain - Rx             SLP eval noted - patient swallowing this AM  Complains of constipation - requests Miralax, will add Dulcolax supp   Encouraged OOB, ambulation.  Patient requests discharge home tomorrow AM.        [name], MD [facility] practice Office: [phone]        Chief Complaint: Incisional hernia  Subjective: Patient up in room, ambulating in halls.  Complains of constipation.  Objective: Vital signs in last 24 hours: Temp:  [98 F (36.7 C)-98.7 F (37.1 C)] 98.2 F (36.8 C) (04/14 0746) Pulse Rate:  [70-74] 72 (04/14 0746) Resp:  [17] 17 (04/14 0746) BP: (96-128)/(54-79) 121/62 (04/14 0746) SpO2:  [96 %-99 %] 98 % (04/14 0746) Last BM Date : 07/01/22  Intake/Output from previous day: 04/13 0701 - 04/14 0700 In: 120 [P.O.:120] Out: -  Intake/Output this shift: No intake/output data recorded.  Physical Exam: HEENT - sclerae clear, mucous membranes moist Abd - soft, mild tenderness; binder in place  Lab Results:  Recent Labs    07/02/22 0057  WBC 9.2  HGB 12.8  HCT 37.9  PLT 221   BMET No results for input(s): "NA", "K", "CL", "CO2", "GLUCOSE", "BUN", "CREATININE", "CALCIUM" in the last 72 hours. PT/INR No results for input(s): "LABPROT", "INR" in the last 72 hours. Comprehensive Metabolic Panel:    Component Value Date/Time   NA 136 07/01/2022 0600   NA 139 04/16/2022 1415   NA 139 10/20/2021 1613   NA 141 08/04/2020 1059   NA 139 02/18/2015 1440   NA 141 02/07/2014 1401   K 3.0 (L) 07/01/2022 0600   K 3.7 04/16/2022 1415   K 4.2 02/18/2015 1440   K 3.9 02/07/2014 1401   CL 99 07/01/2022 0600   CL 102 04/16/2022 1415   CL 103 07/21/2012 1415   CL 103 02/15/2012 1029   CO2 27 07/01/2022 0600   CO2 28 04/16/2022 1415   CO2 29 02/18/2015 1440   CO2 26 02/07/2014  1401   BUN 20 07/01/2022 0600   BUN 22 04/16/2022 1415   BUN 14 10/20/2021 1613   BUN 39 (H) 08/04/2020 1059   BUN 19.1 02/18/2015 1440   BUN 16.4 02/07/2014 1401   CREATININE 1.14 (H) 07/01/2022 0600   CREATININE 1.10 (H) 04/16/2022 1415   CREATININE 0.9 02/18/2015 1440   CREATININE 0.8 02/07/2014 1401   GLUCOSE 128 (H) 07/01/2022 0600   GLUCOSE 106 (H) 04/16/2022 1415   GLUCOSE 73 02/18/2015 1440   GLUCOSE 97 02/07/2014 1401   GLUCOSE 106 (H) 07/21/2012 1415   GLUCOSE 113 (H) 02/15/2012 1029   CALCIUM 9.8 07/01/2022 0600   CALCIUM 9.6 04/16/2022 1415   CALCIUM 10.2 02/18/2015 1440   CALCIUM 9.9 02/07/2014 1401   AST 23 04/16/2022 1415   AST 22 01/13/2022 1915   AST 20 02/18/2015 1440   AST 19 02/07/2014 1401   ALT 22 04/16/2022 1415   ALT 24 01/13/2022 1915   ALT 16 02/18/2015 1440   ALT 17 02/07/2014 1401   ALKPHOS 68 04/16/2022 1415   ALKPHOS 69 01/13/2022 1915   ALKPHOS 89 02/18/2015 1440   ALKPHOS 92 02/07/2014 1401   BILITOT 0.3 04/16/2022 1415   BILITOT  0.9 01/13/2022 1915   BILITOT 0.4 10/20/2021 1613   BILITOT 0.37 02/18/2015 1440   BILITOT 0.38 02/07/2014 1401   PROT 6.9 04/16/2022 1415   PROT 7.8 01/13/2022 1915   PROT 6.5 10/20/2021 1613   PROT 7.4 02/18/2015 1440   PROT 7.2 02/07/2014 1401   ALBUMIN 3.9 04/16/2022 1415   ALBUMIN 4.4 01/13/2022 1915   ALBUMIN 4.1 10/20/2021 1613   ALBUMIN 3.8 02/18/2015 1440   ALBUMIN 3.8 02/07/2014 1401    Studies/Results: No results found.    [name] 07/04/2022  Patient ID: [name], female   DOB: 21-Aug-1945, 77 y.o.   MRN: [id number]

## 2022-07-04 NOTE — Plan of Care (Signed)

## 2022-07-05 NOTE — Care Management Important Message (Signed)
Important Message  Patient Details  Name: [name] MRN: [id number] Date of Birth: 05-09-1945   Medicare Important Message Given:  Yes     Sherilyn Banker 07/05/2022, 11:47 AM

## 2022-07-05 NOTE — Plan of Care (Signed)

## 2022-07-05 NOTE — Progress Notes (Signed)
Pt c/o abdominal pain at this time, states she was about to call her nurse for pain medication. Also states pain medication makes her sleepy.  Pt's primary nurse has give pt her discharge instructions for discharge to home.  Pt's ride not coming til around 1:30pm.  Pt not able to go to discharge lounge at this time due to pain/pain medication need.  Will reassess lounge appropriateness after pain medication and closer to her ride coming.  Will inform pt's primary nurse, Tiara RN.

## 2022-07-16 ENCOUNTER — Telehealth: Payer: Self-pay | Admitting: *Deleted

## 2022-07-16 NOTE — Telephone Encounter (Signed)
Opened in error

## 2022-07-23 DIAGNOSIS — I10 Essential (primary) hypertension: Secondary | ICD-10-CM | POA: Diagnosis not present

## 2022-07-23 DIAGNOSIS — R7303 Prediabetes: Secondary | ICD-10-CM | POA: Diagnosis not present

## 2022-07-23 DIAGNOSIS — E039 Hypothyroidism, unspecified: Secondary | ICD-10-CM | POA: Diagnosis not present

## 2022-07-23 DIAGNOSIS — E7800 Pure hypercholesterolemia, unspecified: Secondary | ICD-10-CM | POA: Diagnosis not present

## 2022-07-29 DIAGNOSIS — E7800 Pure hypercholesterolemia, unspecified: Secondary | ICD-10-CM | POA: Diagnosis not present

## 2022-07-29 DIAGNOSIS — E559 Vitamin D deficiency, unspecified: Secondary | ICD-10-CM | POA: Diagnosis not present

## 2022-07-29 DIAGNOSIS — N183 Chronic kidney disease, stage 3 unspecified: Secondary | ICD-10-CM | POA: Diagnosis not present

## 2022-07-29 DIAGNOSIS — R110 Nausea: Secondary | ICD-10-CM | POA: Diagnosis not present

## 2022-07-29 DIAGNOSIS — I130 Hypertensive heart and chronic kidney disease with heart failure and stage 1 through stage 4 chronic kidney disease, or unspecified chronic kidney disease: Secondary | ICD-10-CM | POA: Diagnosis not present

## 2022-07-29 DIAGNOSIS — I5032 Chronic diastolic (congestive) heart failure: Secondary | ICD-10-CM | POA: Diagnosis not present

## 2022-07-29 DIAGNOSIS — E039 Hypothyroidism, unspecified: Secondary | ICD-10-CM | POA: Diagnosis not present

## 2022-07-29 DIAGNOSIS — Z0000 Encounter for general adult medical examination without abnormal findings: Secondary | ICD-10-CM | POA: Diagnosis not present

## 2022-07-29 DIAGNOSIS — R1310 Dysphagia, unspecified: Secondary | ICD-10-CM | POA: Diagnosis not present

## 2022-08-04 ENCOUNTER — Telehealth (HOSPITAL_BASED_OUTPATIENT_CLINIC_OR_DEPARTMENT_OTHER): Payer: Self-pay | Admitting: Cardiology

## 2022-08-04 NOTE — Telephone Encounter (Signed)
Pt c/o medication issue:  1. Name of Medication:    methylphenidate (RITALIN) 5 MG tablet   2. How are you currently taking this medication (dosage and times per day)?   As prescribed  3. Are you having a reaction (difficulty breathing--STAT)?  No  4. What is your medication issue?   Patient wants to raise her dosage of this medication to 10 mg per day and wants to know if this will affect her heart.

## 2022-08-04 NOTE — Telephone Encounter (Signed)
LVM to please call office

## 2022-08-05 NOTE — Telephone Encounter (Signed)
2nd call attempt to patient,   Patient wants to know if she would be able to raise her Ritalin dose up to 10mg   twice a day. She wanted to make sure this wont affect her heart.   She saw PCP almost a week ago and they told her she had heart failure, she would like to confirm if this is true or not.   Advised would have her questions reviewed and would get back with her once reviewed. She is aware MD is out of office.

## 2022-08-05 NOTE — Telephone Encounter (Signed)
Will await MD decision, before returning call to patient.

## 2022-08-05 NOTE — Telephone Encounter (Signed)
It's recommended to avoid Ritalin in patients with: known structural cardiac abnormalities, cardiomyopathy, serious cardiac arrhythmia, coronary artery disease, or other serious cardiac disease. Ritalin can increase BP and HR by activating the sympathetic nervous system and can subsequently increase risk of arrhythmia or MI.  Can't see note from her PCP, she does have diastolic dysfunction noted on her PMH but most recent echo from 01/2022 showed EF of 55-60% and normal pumping function with moderate LV hypertrophy. She does also have a history of HTN however this has been well controlled at recent visits, and a history of CAD on cath.   Would prefer to defer to MD to weigh risk/benefit of this.

## 2022-08-06 DIAGNOSIS — K432 Incisional hernia without obstruction or gangrene: Secondary | ICD-10-CM | POA: Diagnosis not present

## 2022-08-06 DIAGNOSIS — Z9889 Other specified postprocedural states: Secondary | ICD-10-CM | POA: Diagnosis not present

## 2022-08-10 DIAGNOSIS — R42 Dizziness and giddiness: Secondary | ICD-10-CM | POA: Diagnosis not present

## 2022-08-10 DIAGNOSIS — R110 Nausea: Secondary | ICD-10-CM | POA: Diagnosis not present

## 2022-09-01 DIAGNOSIS — H6993 Unspecified Eustachian tube disorder, bilateral: Secondary | ICD-10-CM | POA: Diagnosis not present

## 2022-09-01 DIAGNOSIS — H8113 Benign paroxysmal vertigo, bilateral: Secondary | ICD-10-CM | POA: Diagnosis not present

## 2022-09-01 DIAGNOSIS — J40 Bronchitis, not specified as acute or chronic: Secondary | ICD-10-CM | POA: Diagnosis not present

## 2022-09-13 DIAGNOSIS — J3489 Other specified disorders of nose and nasal sinuses: Secondary | ICD-10-CM | POA: Diagnosis not present

## 2022-09-13 DIAGNOSIS — M7750 Other enthesopathy of unspecified foot: Secondary | ICD-10-CM | POA: Diagnosis not present

## 2022-09-30 DIAGNOSIS — R42 Dizziness and giddiness: Secondary | ICD-10-CM | POA: Diagnosis not present

## 2022-10-20 DIAGNOSIS — R110 Nausea: Secondary | ICD-10-CM | POA: Diagnosis not present

## 2022-10-20 DIAGNOSIS — R109 Unspecified abdominal pain: Secondary | ICD-10-CM | POA: Diagnosis not present

## 2022-10-20 DIAGNOSIS — R1319 Other dysphagia: Secondary | ICD-10-CM | POA: Diagnosis not present

## 2022-11-03 DIAGNOSIS — R109 Unspecified abdominal pain: Secondary | ICD-10-CM | POA: Diagnosis not present

## 2022-11-15 ENCOUNTER — Ambulatory Visit (HOSPITAL_COMMUNITY)
Admission: EM | Admit: 2022-11-15 | Discharge: 2022-11-15 | Disposition: A | Discharge status: Home / Self Care | Payer: Medicare Other | Attending: Psychiatry | Admitting: Psychiatry

## 2022-11-15 DIAGNOSIS — R4589 Other symptoms and signs involving emotional state: Secondary | ICD-10-CM | POA: Diagnosis not present

## 2022-11-15 DIAGNOSIS — F329 Major depressive disorder, single episode, unspecified: Secondary | ICD-10-CM | POA: Diagnosis not present

## 2022-11-15 DIAGNOSIS — Z79899 Other long term (current) drug therapy: Secondary | ICD-10-CM | POA: Diagnosis not present

## 2022-11-15 DIAGNOSIS — F411 Generalized anxiety disorder: Secondary | ICD-10-CM | POA: Diagnosis not present

## 2022-11-15 DIAGNOSIS — F4310 Post-traumatic stress disorder, unspecified: Secondary | ICD-10-CM | POA: Diagnosis not present

## 2022-11-15 DIAGNOSIS — F909 Attention-deficit hyperactivity disorder, unspecified type: Secondary | ICD-10-CM | POA: Diagnosis not present

## 2022-11-15 MED ORDER — CLONIDINE HCL 0.1 MG PO TABS: 0.1000 mg | ORAL_TABLET | Freq: Once | ORAL | Status: DC

## 2022-11-15 NOTE — ED Provider Notes (Signed)
Behavioral Health Urgent Care Medical Screening Exam  Patient Name: [name] MRN: [id number] Date of Evaluation: 11/16/22 Chief Complaint:  increase depression Diagnosis:  Final diagnoses:  Major depressive disorder, remission status unspecified, unspecified whether recurrent  Anxious appearance  Medication management    History of Present illness: [name] is a 77 y.o. female. With a history of major depressive disorder, general anxiety, PTSD, ADHD.  Presented to [facility] voluntarily.  Per the patient she needed help finding a therapist and finding a new psychiatrist because she has increased depression and she is also tried multiple medication for some reason they are not working.  According to the patient her current psychiatrist not doing anything to help her per the patient she was seeing a therapist but the therapist about 2 when the tire so she need to find a new therapist.  According to the patient she does not have any family support because she has a daughter who lives in [place] and who does not talk to her.  Patient also stated that her immediate family do not talk to her either. According to the patient she was hospitalized a last year because of depression.  Per the patient currently lives with her ex-fianc.   Face-to-face observation of patient, patient is alert and oriented x 4, speech is clear, patient does appear to be very depressed and anxious.  At times patient can become tearful when talking.  According to patient she has resistant depression and has tried multiple medication it does not work she also feels she need to find a new psychiatrist.  And a new therapist.  Patient denies current SI, HI, AVH or paranoia.  Patient denies any plans to hurt herself stating that for a few weeks she thought about dying but according to the patient she does not want to die anymore.  Patient states that she does not have access to guns.  Patient denies smoking, drinking, or  illicit drug use at this time.   Patient is educated and verbalized understanding of mental health resources and other crisis services in the community she is instructed to call 911 or presented to the nearest emergency room should she experience any suicidal ideation, hallucination, or homicidal ideation. Writer did collaborate with social worker to provide patient with outpatient resources such as helping her to find a therapist and a psychiatrist.  Patient was also given information for walk-in psychiatry and according to patient she would probably try to make it tomorrow morning if she wakes up in time.  Writer did discuss with patient if she wanted to stay for observation patient stated no she needs to go home because she has some stuff she need to do and if she stays did not go to get done.  Recommend discharge for patient to follow-up with outpatient resources provided and or walk-in psychiatry.  Flowsheet Row ED from 11/15/2022 in [facility] Admission (Discharged) from 07/01/2022 in [facility]  SURGICAL Pre-Admission Testing 60 from 06/25/2022 in [facility] PREADMISSION TESTING  C-SSRS RISK CATEGORY No Risk No Risk No Risk       Psychiatric Specialty Exam  Presentation  General Appearance:Casual  Eye Contact:Good  Speech:Clear and Coherent  Speech Volume:Normal  Handedness:Right   Mood and Affect  Mood: Anxious; Depressed  Affect: Appropriate   Thought Process  Thought Processes: Coherent  Descriptions of Associations:Intact  Orientation:Full (Time, Place and Person)  Thought Content:Logical  Diagnosis of Schizophrenia or  Schizoaffective disorder in past: No data recorded  Hallucinations:None  Ideas of Reference:None  Suicidal Thoughts:No  Homicidal Thoughts:No   Sensorium  Memory: Immediate Good  Judgment: Fair  Insight: Good   Executive Functions   Concentration: Good  Attention Span: Good  Recall: Good  Fund of Knowledge: Good  Language: Good   Psychomotor Activity  Psychomotor Activity: Normal   Assets  Assets: Communication Skills; Social Support   Sleep  Sleep: Fair  Number of hours:  4   Physical Exam: Physical Exam HENT:     Head: Normocephalic.     Nose: Nose normal.  Eyes:     Pupils: Pupils are equal, round, and reactive to light.  Cardiovascular:     Rate and Rhythm: Normal rate.  Pulmonary:     Effort: Pulmonary effort is normal.  Musculoskeletal:        General: Normal range of motion.     Cervical back: Normal range of motion.  Neurological:     General: No focal deficit present.     Mental Status: She is alert.  Psychiatric:        Mood and Affect: Mood normal.        Behavior: Behavior normal.        Thought Content: Thought content normal.        Judgment: Judgment normal.    Review of Systems  Constitutional: Negative.   HENT: Negative.    Eyes: Negative.   Respiratory: Negative.    Cardiovascular: Negative.   Gastrointestinal: Negative.   Genitourinary: Negative.   Musculoskeletal: Negative.   Skin: Negative.   Neurological: Negative.   Psychiatric/Behavioral:  Positive for depression. The patient is nervous/anxious.    Blood pressure (!) 142/70, pulse 64, temperature 97.6 F (36.4 C), temperature source Oral, resp. rate 18, SpO2 100%. There is no height or weight on file to calculate BMI.  Musculoskeletal: Strength & Muscle Tone: within normal limits Gait & Station: normal Patient leans: N/A   [facility] MSE Discharge Disposition for Follow up and Recommendations: Based on my evaluation the patient does not appear to have an emergency medical condition and can be discharged with resources and follow up care in outpatient services for Medication Management and Individual Therapy   Sindy Guadeloupe, NP 11/16/2022, 5:08 AM

## 2022-11-15 NOTE — Progress Notes (Signed)
   11/15/22 1734  [facility] Triage Screening (Walk-ins at [facility] only)  How Did You Hear About [place]? Self  What Is the Reason for Your Visit/Call Today? Pt presents to [facility] voluntarily accompanied by her friend. Pt reports that she has major depressive disorder and anxiety disorder. Pt states, "I am here because I need a therapist and a psychiatrist". Pt reports that her depression has been worsening since Nov 2023. Pt states, "Today I am just screaming and just dont know what to do anymore". Pt appears to be tearful and anxious throughout her assessment. Pt reports that she has had thoughts of wanting to die in the past, but is currently not having these thoughts at this time. Pt denies substance use, SI, HI and AVH.  How Long Has This Been Causing You Problems? > than 6 months  Have You Recently Had Any Thoughts About Hurting Yourself? No  Are You Planning to Commit Suicide/Harm Yourself At This time? No  Have you Recently Had Thoughts About Hurting Someone Karolee Ohs? No  Are You Planning To Harm Someone At This Time? No  Are you currently experiencing any auditory, visual or other hallucinations? No  Have You Used Any Alcohol or Drugs in the Past 24 Hours? No  Do you have any current medical co-morbidities that require immediate attention? No  Clinician description of patient physical appearance/behavior: tearful, anxious  What Do You Feel Would Help You the Most Today? Stress Management;Social Support  If access to [facility] was not available, would you have sought care in the Emergency Department? No  Determination of Need Routine (7 days)  Options For Referral Outpatient Therapy

## 2022-11-23 DIAGNOSIS — H6980 Other specified disorders of Eustachian tube, unspecified ear: Secondary | ICD-10-CM | POA: Diagnosis not present

## 2022-11-23 DIAGNOSIS — R110 Nausea: Secondary | ICD-10-CM | POA: Diagnosis not present

## 2022-11-23 DIAGNOSIS — R1310 Dysphagia, unspecified: Secondary | ICD-10-CM | POA: Diagnosis not present

## 2022-11-23 DIAGNOSIS — R634 Abnormal weight loss: Secondary | ICD-10-CM | POA: Diagnosis not present

## 2022-11-24 ENCOUNTER — Ambulatory Visit (HOSPITAL_BASED_OUTPATIENT_CLINIC_OR_DEPARTMENT_OTHER): Payer: Medicare Other | Admitting: Cardiology

## 2022-11-24 NOTE — Progress Notes (Incomplete)
Cardiology Office Note:  .    Date:  11/24/2022  ID:  [name], DOB Nov 18, 1945, MRN [id number] PCP: Daisy Floro, MD  [facility] Providers Cardiologist:  Jodelle Red, MD { Click to update primary MD,subspecialty MD or APP then REFRESH:1}    History of Present Illness: .    [name] is a 77 y.o. female with a hx of PVCs, paroxysmal atrial tachycardia s/p ablation, hypertension, CAD, renal cell carcinoma s/p nephrectomy, who is seen for follow up today. I initially met her 02/13/19 as a new patient to me/prior patient of Dr. Mayford Knife for the evaluation and management of her above issues.   At her visit 10/2020, she complained of pounding palpitations and hearing her heartbeat while lying in bed; no known correlation with taking ritalin. She was in an intensive outpatient program, which she felt was helping with her anxiety. Her beta-blocker was improving anxiety by 25% per her experience. Her blood pressure was well controlled in the office.   She was seen by    Today,  She denies any palpitations, chest pain, shortness of breath, peripheral edema, lightheadedness, headaches, syncope, orthopnea, or PND.  ROS:  Please see the history of present illness. ROS otherwise negative except as noted.  (+)  Studies Reviewed: .         Risk Assessment/Calculations:    {Does this patient have ATRIAL FIBRILLATION?:[id number]} No BP recorded.  {Refresh Note OR Click here to enter BP  :1}***       Physical Exam:    VS:  There were no vitals taken for this visit.   Wt Readings from Last 3 Encounters:  07/01/22 220 lb (99.8 kg)  06/25/22 225 lb 8 oz (102.3 kg)  05/14/22 229 lb (103.9 kg)    GEN: Well nourished, well developed in no acute distress HEENT: Normal, moist mucous membranes NECK: No JVD CARDIAC: regular rhythm, normal S1 and S2, no rubs or gallops. ***No murmur. VASCULAR: Radial and DP pulses 2+ bilaterally. No carotid bruits RESPIRATORY:   Clear to auscultation without rales, wheezing or rhonchi  ABDOMEN: Soft, non-tender, non-distended MUSCULOSKELETAL:  Ambulates independently SKIN: Warm and dry, no edema NEUROLOGIC:  Alert and oriented x 3. No focal neuro deficits noted. PSYCHIATRIC:  Normal affect   ASSESSMENT AND PLAN: .    Coronary disease by cath 2018, exercise intolerance -based on cath in 2018, would aim for medical management as much as tolerated. -we reviewed the results of her recent nuclear stress test. Very reassuring, normal EF, no evidence of ischemia -recommended aspirin, statin in the past. See below -beta blocker as below   Hypercholesterolemia: LDL goal <70, last was 101 per [facility] on 08/07/20 -has not tolerated multiple statins in the past. Was tolerating rosuvastatin 5 mg daily, endorses that she is currently taking this -we have discussed additional therapies, but with her extensive history of medication intolerances, will continue current plan   Hypertension: at goal in office today -continue losartan-HCTZ 50-12.5 mg daily -feels that metoprolol has helped her since starting, continue   Anxiety/depression: she is being followed closely for management of these. We discussed how these can overlap with cardiovascular issues today   Cardiac risk counseling and prevention recommendations: -recommend heart healthy/Mediterranean diet, with whole grains, fruits, vegetable, fish, lean meats, nuts, and olive oil. Limit salt. -recommend moderate walking, 3-5 times/week for 30-50 minutes each session. Aim for at least 150 minutes.week. Goal should be pace of 3 miles/hours, or walking 1.5 miles in 30  minutes -recommend avoidance of tobacco products. Avoid excess alcohol.     {Are you ordering a CV Procedure (e.g. stress test, cath, DCCV, TEE, etc)?   Press F2        :[id number]}  Dispo: Follow-up in *** months, or sooner as needed.  I,Mathew Stumpf,acting as a Neurosurgeon for [employer], MD.,have  documented all relevant documentation on the behalf of Jodelle Red, MD,as directed by  Jodelle Red, MD while in the presence of Jodelle Red, MD.  ***  Signed, Carlena Bjornstad

## 2022-12-21 DIAGNOSIS — Z85528 Personal history of other malignant neoplasm of kidney: Secondary | ICD-10-CM | POA: Diagnosis not present

## 2022-12-21 DIAGNOSIS — R1314 Dysphagia, pharyngoesophageal phase: Secondary | ICD-10-CM | POA: Diagnosis not present

## 2022-12-21 NOTE — Progress Notes (Addendum)
[facility] OFFICE PROGRESS NOTE  Brooke Floro, MD [address] 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   PRIOR THERAPY: She is status post right radical nephrectomy completed by Dr. Arita Miss on Jul 31, 2019. Final pathology showed a 6 cm clear-cell renal cell carcinoma.   CURRENT THERAPY: Active surveillance   INTERVAL HISTORY: [name] 77 y.o. female returns to the clinic today for a follow-up visit.  She was previously followed by Dr. Clelia Croft for her history of renal cell carcinoma.  She was last seen by him on 12/29/2021.  Her most recent CT scan was in November 2023 which did not show any evidence of disease progression.  Dr. Clelia Croft recommended annual imaging studies but with a follow-up visit in 6 months.  Dr. Clelia Croft has since left the practice and she is here to establish with Dr. Arbutus Ped and myself today.  Since last being seen, she is feeling terrible. She states this is one of the worst years of her life. She follows with psychiatry and reports she is medication resistant for medications for depression. She also has been seeing GI at [facility] for abdominal discomfort, weight loss, and nausea.  They are planning on doing an upper endoscopy next week.  She also had hernia surgery since last being seen in April 2024.    She denies any fever or chills.  Does have night sweats.  It appears she lost 20 to 30 pounds since last year being seen in October 2023.  She reports a cough but she was told it may be secondary to reflux/stomach/esophagus.  She has some dyspnea on exertion/fatigue with exercise it has been going on for about a year.  Denies any vomiting.  Denies any chest pain or hemoptysis.  She denies any vomiting or diarrhea.  She takes MiraLAX twice a day and eats a lot of fiber for constipation.  Denies any urinary changes such as dysuria, malodorous urine, or hematuria.  She is here today  for evaluation and to establish care with Dr. Arbutus Ped and myself.  MEDICAL HISTORY: Past Medical History:  Diagnosis Date   Abscess of Bartholin's gland    Acquired hypothyroidism 1990   after partial thyroidectomy for thyroid adenoma   Acute vestibular neuronitis    ADHD (attention deficit hyperactivity disorder)    Adverse effect of general anesthetic    felt paralyzed while receiving anesthesia   Arthritis    bilateral shoulders, knees and hips   Bursitis of right shoulder 05/24/2019   Right shoulder subacromial injection   CAD (coronary artery disease)    cath 05/2016 showing 50-70% stenosis in the mid LAD proximal to the first diagonal and 70-80% small OM1. FFR of LAD  not performed because of difficulty with catheter control from the right radial.    Chronic cough 02/22/2020   Chronic kidney disease    hx of kidney cancer   Diastolic dysfunction, left ventricle 05/31/2013   Difficult intubation    told by [facility] that she was hard to intubate 15 yrs ago in [place]- surgery since then no problems   Dyspnea    Essential hypertension 05/31/2013   Fibromyalgia    Generalized anxiety disorder    Adequate for discharge    GERD (gastroesophageal reflux disease)    History of attention deficit disorder    History of echocardiogram 07/2012   Normal LVF w grade I siastolic dysfunction    History of  endometriosis    History of gall stones 12/31/2009   History of pleural effusion    Hyperlipidemia 06/24/2016   LDL goal <70   Hypertension    Hypothyroidism 1990   after partial thyroidectomy for thyroid adenoma   Major depressive disorder    Obesity    Obstructive sleep apnea 02/20/2007   NPSG 2004:  AHI 10/hr Failed cpap trials.>dental appliance   Split night 06/2015 >Moderate obstructive sleep apnea occurred during this study  (AHI = 22.7/h).>rec CPAP >> poor compliance  HST 11/2016 AHI 12/h, 7h TST  03/2018 -office visit with Dr. Vassie Loll discussed inspire device, patient would need to lose  weight   PAT (paroxysmal atrial tachycardia)    s/p ablation   PONV (postoperative nausea and vomiting)    Pre-diabetes    PTSD (post-traumatic stress disorder)    Pure hypercholesterolemia 02/18/2019   PVC's (premature ventricular contractions)    Renal mass 02/15/2012   Restless legs syndrome (RLS) 02/20/2007   Vertigo     ALLERGIES:  is allergic to geodon [ziprasidone hydrochloride], lithium, talwin [pentazocine], toradol [ketorolac tromethamine], abilify [aripiprazole], compazine [prochlorperazine edisylate], latuda [lurasidone hcl], pristiq [desvenlafaxine succinate er], rexulti [brexpiprazole], and diflucan [fluconazole].  MEDICATIONS:  Current Outpatient Medications  Medication Sig Dispense Refill   acetaminophen (TYLENOL) 500 MG tablet Take 500-1,000 mg by mouth as needed for mild pain (or headaches).     ARTIFICIAL TEAR OP Place 2 drops into both eyes daily as needed (dryness).     B Complex Vitamins (B COMPLEX PO) Take 1 mL by mouth daily.     diphenhydrAMINE (BENADRYL) 25 mg capsule Take 25 mg by mouth as needed for allergies.     DULoxetine (CYMBALTA) 30 MG capsule Take 30 mg by mouth 2 (two) times daily.     ezetimibe (ZETIA) 10 MG tablet Take 1 tablet (10 mg total) by mouth daily. 90 tablet 3   ipratropium (ATROVENT) 0.06 % nasal spray Place 1-2 sprays into both nostrils as needed for rhinitis.     levothyroxine (SYNTHROID) 88 MCG tablet Take 88 mcg by mouth every morning.     losartan-hydrochlorothiazide (HYZAAR) 50-12.5 MG tablet TAKE 1 TABLET BY MOUTH TWICE A DAY 90 tablet 1   Magnesium Oxide, Antacid, 500 MG CAPS Take 500 mg by mouth daily.     methocarbamol (ROBAXIN) 500 MG tablet Take 1 tablet (500 mg total) by mouth every 6 (six) hours as needed for muscle spasms. 30 tablet 0   methylphenidate (RITALIN) 5 MG tablet Take 5 mg by mouth 2 (two) times daily.     metoprolol tartrate (LOPRESSOR) 25 MG tablet Take 25 mg by mouth daily.     MINERAL ICE 2 % GEL Apply 1  application  topically in the morning and at bedtime.     Omega-3 Fatty Acids (FISH OIL PO) Take 1 capsule by mouth daily.     oxyCODONE (OXY IR/ROXICODONE) 5 MG immediate release tablet Take 1-2 tablets (5-10 mg total) by mouth every 4 (four) hours as needed for moderate pain. 30 tablet 0   promethazine (PHENERGAN) 25 MG tablet Take 25 mg by mouth as needed for nausea or vomiting.     rOPINIRole (REQUIP) 0.5 MG tablet TAKE 1 TABLET BY MOUTH EVERY DAY AFTER SUPPER AND TAKE 2 TABLETS AT BEDTIME (Patient taking differently: Take 0.5-1 mg by mouth at bedtime.) 270 tablet 3   rosuvastatin (CRESTOR) 10 MG tablet Take 10 mg by mouth daily.     rosuvastatin (CRESTOR) 5 MG tablet  Take 1 tablet (5 mg total) by mouth daily. (Patient not taking: Reported on 04/16/2022) 90 tablet 3   UNABLE TO FIND Take 60 mg by mouth at bedtime. Med Name: Delta 8     No current facility-administered medications for this visit.    SURGICAL HISTORY:  Past Surgical History:  Procedure Laterality Date   CARDIAC ELECTROPHYSIOLOGY MAPPING AND ABLATION  2000s   INCISIONAL HERNIA REPAIR N/A 07/01/2022   Procedure: OPEN INCISIONAL HERNIA REPAIR, TAR PROCEDURE;  Surgeon: Axel Filler, MD;  Location: [facility] OR;  Service: General;  Laterality: N/A;   INSERTION OF MESH N/A 07/01/2022   Procedure: INSERTION OF MESH;  Surgeon: Axel Filler, MD;  Location: [facility] OR;  Service: General;  Laterality: N/A;   LAPAROSCOPIC NEPHRECTOMY, HAND ASSISTED Right 07/31/2019   Procedure: HAND ASSISTED LAPAROSCOPIC NEPHRECTOMY CONVERTED TO OPEN WITH REPAIR OF INFERIOR VENA CAVAOTOMY;  Surgeon: Noel Christmas, MD;  Location: [facility] ORS;  Service: Urology;  Laterality: Right;  3 HRS   laparoscopy     LEFT HEART CATH AND CORONARY ANGIOGRAPHY N/A 06/08/2016   Procedure: Left Heart Cath and Coronary Angiography;  Surgeon: Lyn Records, MD;  Location: [facility] INVASIVE CV LAB;  Service: Cardiovascular;  Laterality: N/A;   nasoseptal reconstruction  1990s    NEPHRECTOMY RECIPIENT Right 08/2019   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   THYROIDECTOMY  1990   TONSILLECTOMY     as a child   TUBAL LIGATION  1970s   uterine mass removal  03/2012   was found to be benign    REVIEW OF SYSTEMS:   Review of Systems  Constitutional: Positive for fatigue and weight loss.  Negative for appetite change, chills, and fever.   HENT: Negative for mouth sores, nosebleeds, sore throat and trouble swallowing.   Eyes: Negative for eye problems and icterus.  Respiratory: Positive for cough and dyspnea on exertion.  Negative for hemoptysis and wheezing.   Cardiovascular: Negative for chest pain and leg swelling.  Gastrointestinal: Positive for frequent nausea, constipation, and abdominal discomfort.  Negative for diarrhea and vomiting.  Genitourinary: Negative for bladder incontinence, difficulty urinating, dysuria, frequency and hematuria.   Musculoskeletal: Negative for back pain, gait problem, neck pain and neck stiffness.  Skin: Negative for itching and rash.  Neurological: Negative for dizziness, extremity weakness, gait problem, headaches, light-headedness and seizures.  Hematological: Negative for adenopathy. Does not bruise/bleed easily.  Psychiatric/Behavioral: Negative for confusion, depression and sleep disturbance. The patient is not nervous/anxious.     PHYSICAL EXAMINATION:  Blood pressure 131/68, pulse 60, temperature 98.1 F (36.7 C), temperature source Oral, resp. rate 13, weight 212 lb 3.2 oz (96.3 kg), SpO2 100%.  ECOG PERFORMANCE STATUS: 1  Physical Exam  Constitutional: Oriented to person, place, and time and well-developed, well-nourished, and in no distress.  HENT:  Head: Normocephalic and atraumatic.  Mouth/Throat: Oropharynx is clear and moist. No oropharyngeal exudate.  Eyes: Conjunctivae are normal. Right eye exhibits no discharge. Left eye exhibits no discharge. No scleral icterus.  Neck: Normal range of motion. Neck supple.   Cardiovascular: Normal rate, regular rhythm, normal heart sounds and intact distal pulses.   Pulmonary/Chest: Effort normal and breath sounds normal. No respiratory distress. No wheezes. No rales.  Abdominal: Soft. Bowel sounds are normal. Exhibits no distension and no mass. There is no tenderness to palpation.  Musculoskeletal: Normal range of motion. Exhibits no edema.  Lymphadenopathy:    No cervical adenopathy.  Neurological: Alert and oriented to person, place, and time.  Exhibits normal muscle tone. Gait normal. Coordination normal.  Skin: Skin is warm and dry. No rash noted. Not diaphoretic. No erythema. No pallor.  Psychiatric: Mood, memory and judgment normal.  Vitals reviewed.  LABORATORY DATA: Lab Results  Component Value Date   WBC 9.2 07/02/2022   HGB 12.8 07/02/2022   HCT 37.9 07/02/2022   MCV 84.2 07/02/2022   PLT 221 07/02/2022      Chemistry      Component Value Date/Time   NA 136 07/01/2022 0600   NA 139 10/20/2021 1613   NA 139 02/18/2015 1440   K 3.0 (L) 07/01/2022 0600   K 4.2 02/18/2015 1440   CL 99 07/01/2022 0600   CL 103 07/21/2012 1415   CO2 27 07/01/2022 0600   CO2 29 02/18/2015 1440   BUN 20 07/01/2022 0600   BUN 14 10/20/2021 1613   BUN 19.1 02/18/2015 1440   CREATININE 1.14 (H) 07/01/2022 0600   CREATININE 0.9 02/18/2015 1440      Component Value Date/Time   CALCIUM 9.8 07/01/2022 0600   CALCIUM 10.2 02/18/2015 1440   ALKPHOS 68 04/16/2022 1415   ALKPHOS 89 02/18/2015 1440   AST 23 04/16/2022 1415   AST 20 02/18/2015 1440   ALT 22 04/16/2022 1415   ALT 16 02/18/2015 1440   BILITOT 0.3 04/16/2022 1415   BILITOT 0.4 10/20/2021 1613   BILITOT 0.37 02/18/2015 1440       RADIOGRAPHIC STUDIES:  No results found.   ASSESSMENT/PLAN:  This is a very pleasant 77 year old Caucasian female with a history of stage T3a clear-cell renal cell carcinoma.  She was diagnosed in May 2021 after radical nephrectomy.  She is currently on  observation with repeat imaging on an annual basis her most recent CT scan was in November 2023.  The patient was previously followed by Dr. Clelia Croft who is since left the practice.  She is establishing care with Dr. Arbutus Ped and myself today.  Labs were reviewed.  The patient was seen with Dr. Arbutus Ped today.  We will arrange for restaging CT scan and we will move this to be performed sooner given her abdominal concerns.  If this is negative for any concerning findings, we will see her back with lab work in 1 year.  If this is abnormal, we will bring her in sooner to discuss next steps.  She will be heading to the lab after the appointment today.  If there is any concerns with her CBC and CMP, then we will call her for further instruction.  She will have an endoscopy next week performed at [facility] as scheduled.   The patient was advised to call immediately if she has any concerning symptoms in the interval. The patient voices understanding of current disease status and treatment options and is in agreement with the current care plan. All questions were answered. The patient knows to call the clinic with any problems, questions or concerns. We can certainly see the patient much sooner if necessary   Orders Placed This Encounter  Procedures   CT CHEST ABDOMEN PELVIS WO CONTRAST    Standing Status:   Future    Standing Expiration Date:   12/23/2023    Order Specific Question:   Preferred imaging location?    Answer:   [facility]    Order Specific Question:   If indicated for the ordered procedure, I authorize the administration of oral contrast media per Radiology protocol    Answer:   Yes  Order Specific Question:   Does the patient have a contrast media/X-ray dye allergy?    Answer:   No      Chaniqua Brisby L Desmond Szabo, PA-C 12/23/22  ADDENDUM: Hematology/Oncology Attending: I had a face-to-face encounter with the patient today.  I reviewed her records, lab and recommended her  care plan.  This is a very pleasant 77 years old white female diagnosed with stage IIIa (T3a, N0, M0) clear-cell renal cell carcinoma in May 2021 status post right radical nephrectomy with final pathology showing 6.0 cm clear-cell renal cell carcinoma.  She has been on active surveillance since that time.  The patient was followed by Dr. Clelia Croft and she is here today to establish care with me after his departure.  Her last CT scan of the chest, abdomen and pelvis were performed in November 2023.  It did not show any evidence of disease progression at that time. She has been complaining of upset stomach with getting sweaty and nauseating after eating.  She is followed by [facility] gastroenterology and expected to have repeat endoscopy next week. I recommended for the patient to have repeat CT scan of the chest, abdomen and pelvis in the next 1-2 weeks with follow-up visit after the scan if there is any concerning findings or routine follow-up visit and imaging studies in 1 year if there is no concerning findings on the upcoming scan. The patient was advised to call immediately if she has any other concerning symptoms in the interval. The total time spent in the appointment was 30 minutes. Disclaimer: This note was dictated with voice recognition software. Similar sounding words can inadvertently be transcribed and may be missed upon review. Lajuana Matte, MD

## 2022-12-23 ENCOUNTER — Inpatient Hospital Stay: Payer: Medicare Other | Attending: Physician Assistant | Admitting: Physician Assistant

## 2022-12-23 ENCOUNTER — Inpatient Hospital Stay: Payer: Medicare Other

## 2022-12-23 VITALS — BP 131/68 | HR 60 | Temp 98.1°F | Resp 13 | Wt 212.2 lb

## 2022-12-23 DIAGNOSIS — M25562 Pain in left knee: Secondary | ICD-10-CM | POA: Diagnosis not present

## 2022-12-23 DIAGNOSIS — Z85528 Personal history of other malignant neoplasm of kidney: Secondary | ICD-10-CM | POA: Insufficient documentation

## 2022-12-23 DIAGNOSIS — C641 Malignant neoplasm of right kidney, except renal pelvis: Secondary | ICD-10-CM

## 2022-12-23 DIAGNOSIS — Z905 Acquired absence of kidney: Secondary | ICD-10-CM | POA: Diagnosis not present

## 2022-12-23 DIAGNOSIS — R109 Unspecified abdominal pain: Secondary | ICD-10-CM | POA: Diagnosis not present

## 2022-12-23 DIAGNOSIS — M25511 Pain in right shoulder: Secondary | ICD-10-CM | POA: Diagnosis not present

## 2022-12-23 DIAGNOSIS — M25561 Pain in right knee: Secondary | ICD-10-CM | POA: Diagnosis not present

## 2022-12-23 DIAGNOSIS — M1712 Unilateral primary osteoarthritis, left knee: Secondary | ICD-10-CM | POA: Diagnosis not present

## 2022-12-23 DIAGNOSIS — M25512 Pain in left shoulder: Secondary | ICD-10-CM | POA: Diagnosis not present

## 2022-12-23 DIAGNOSIS — Z79899 Other long term (current) drug therapy: Secondary | ICD-10-CM | POA: Diagnosis not present

## 2022-12-23 LAB — CBC WITH DIFFERENTIAL/PLATELET
Abs Immature Granulocytes: 0.01 10*3/uL (ref 0.00–0.07)
Basophils Absolute: 0 10*3/uL (ref 0.0–0.1)
Basophils Relative: 1 %
Eosinophils Absolute: 0.3 10*3/uL (ref 0.0–0.5)
Eosinophils Relative: 5 %
HCT: 40.6 % (ref 36.0–46.0)
Hemoglobin: 13.4 g/dL (ref 12.0–15.0)
Immature Granulocytes: 0 %
Lymphocytes Relative: 33 %
Lymphs Abs: 1.9 10*3/uL (ref 0.7–4.0)
MCH: 28 pg (ref 26.0–34.0)
MCHC: 33 g/dL (ref 30.0–36.0)
MCV: 84.8 fL (ref 80.0–100.0)
Monocytes Absolute: 0.4 10*3/uL (ref 0.1–1.0)
Monocytes Relative: 7 %
Neutro Abs: 3.2 10*3/uL (ref 1.7–7.7)
Neutrophils Relative %: 54 %
Platelets: 251 10*3/uL (ref 150–400)
RBC: 4.79 MIL/uL (ref 3.87–5.11)
RDW: 14.3 % (ref 11.5–15.5)
WBC: 5.8 10*3/uL (ref 4.0–10.5)
nRBC: 0 % (ref 0.0–0.2)

## 2022-12-23 LAB — COMPREHENSIVE METABOLIC PANEL
ALT: 14 U/L (ref 0–44)
AST: 17 U/L (ref 15–41)
Albumin: 4 g/dL (ref 3.5–5.0)
Alkaline Phosphatase: 76 U/L (ref 38–126)
Anion gap: 6 (ref 5–15)
BUN: 24 mg/dL — ABNORMAL HIGH (ref 8–23)
CO2: 28 mmol/L (ref 22–32)
Calcium: 9.7 mg/dL (ref 8.9–10.3)
Chloride: 108 mmol/L (ref 98–111)
Creatinine, Ser: 1.27 mg/dL — ABNORMAL HIGH (ref 0.44–1.00)
GFR, Estimated: 44 mL/min — ABNORMAL LOW (ref >60)
Glucose, Bld: 88 mg/dL (ref 70–99)
Potassium: 4.2 mmol/L (ref 3.5–5.1)
Sodium: 142 mmol/L (ref 135–145)
Total Bilirubin: 0.4 mg/dL (ref 0.3–1.2)
Total Protein: 6.8 g/dL (ref 6.5–8.1)

## 2022-12-29 DIAGNOSIS — R1310 Dysphagia, unspecified: Secondary | ICD-10-CM | POA: Diagnosis not present

## 2022-12-29 DIAGNOSIS — K2950 Unspecified chronic gastritis without bleeding: Secondary | ICD-10-CM | POA: Diagnosis not present

## 2023-01-06 ENCOUNTER — Ambulatory Visit (HOSPITAL_COMMUNITY)
Admission: RE | Admit: 2023-01-06 | Discharge: 2023-01-06 | Disposition: A | Discharge status: Home / Self Care | Payer: Medicare Other | Source: Ambulatory Visit | Attending: Physician Assistant | Admitting: Physician Assistant

## 2023-01-06 DIAGNOSIS — R918 Other nonspecific abnormal finding of lung field: Secondary | ICD-10-CM | POA: Diagnosis not present

## 2023-01-06 DIAGNOSIS — K439 Ventral hernia without obstruction or gangrene: Secondary | ICD-10-CM | POA: Diagnosis not present

## 2023-01-06 DIAGNOSIS — R109 Unspecified abdominal pain: Secondary | ICD-10-CM | POA: Diagnosis not present

## 2023-01-06 DIAGNOSIS — K5750 Diverticulosis of both small and large intestine without perforation or abscess without bleeding: Secondary | ICD-10-CM | POA: Diagnosis not present

## 2023-01-06 DIAGNOSIS — C641 Malignant neoplasm of right kidney, except renal pelvis: Secondary | ICD-10-CM | POA: Insufficient documentation

## 2023-01-06 DIAGNOSIS — K8020 Calculus of gallbladder without cholecystitis without obstruction: Secondary | ICD-10-CM | POA: Diagnosis not present

## 2023-01-06 MED ORDER — IOHEXOL 9 MG/ML PO SOLN
1000.0000 mL | Freq: Once | ORAL | Status: AC
  Administered 2023-01-06: 1000 mL via ORAL

## 2023-01-11 DIAGNOSIS — J3089 Other allergic rhinitis: Secondary | ICD-10-CM | POA: Diagnosis not present

## 2023-01-11 DIAGNOSIS — J3489 Other specified disorders of nose and nasal sinuses: Secondary | ICD-10-CM | POA: Diagnosis not present

## 2023-01-11 DIAGNOSIS — R631 Polydipsia: Secondary | ICD-10-CM | POA: Diagnosis not present

## 2023-01-12 ENCOUNTER — Other Ambulatory Visit: Payer: Self-pay | Admitting: Physician Assistant

## 2023-01-12 ENCOUNTER — Telehealth: Payer: Self-pay

## 2023-01-12 DIAGNOSIS — C641 Malignant neoplasm of right kidney, except renal pelvis: Secondary | ICD-10-CM

## 2023-01-12 DIAGNOSIS — R911 Solitary pulmonary nodule: Secondary | ICD-10-CM

## 2023-01-12 NOTE — Telephone Encounter (Signed)
Attempted to reach pt again and contacted the significant other on the phone list as well and LVM.  Sending [employer].

## 2023-01-12 NOTE — Progress Notes (Signed)
Reviewed her CT with Dr. Arbutus Ped. We will arrange follow up CT in 6 months to monitor small lung nodules. We will also fax a copy of her scan to her surgeon regarding the surgical changes.

## 2023-01-12 NOTE — Telephone Encounter (Signed)
Called pt and went straight to VM in regards to recent CT scan results. Will continue to try and reach pt through out the day.

## 2023-01-13 NOTE — Telephone Encounter (Signed)
Called pt and LVM for return call. Mychart message sent yesterday as well.

## 2023-01-13 NOTE — Telephone Encounter (Signed)
Spoke with pt regarding CT scan results.  Per Dr. Arbutus Ped, there is a new lung nodule that could possibly be an inflammatory nodule that comes and goes.  Recommend f/u in 6 months with scan.  Pt had recent hernia surgery and scan showed some new soft tissue in that area.  Recommend f/u with surgeons office. Pt verbalized understanding and was told to call with any concerns or questions. Faxing CT scan to PCP per patient request and Dr. Harden Mo office.

## 2023-01-19 ENCOUNTER — Ambulatory Visit (HOSPITAL_COMMUNITY)
Admission: EM | Admit: 2023-01-19 | Discharge: 2023-01-19 | Disposition: A | Discharge status: Home / Self Care | Payer: Medicare Other | Attending: Psychiatry | Admitting: Psychiatry

## 2023-01-19 DIAGNOSIS — F332 Major depressive disorder, recurrent severe without psychotic features: Secondary | ICD-10-CM

## 2023-01-19 MED ORDER — HYDROXYZINE HCL 25 MG PO TABS
25.0000 mg | ORAL_TABLET | Freq: Three times a day (TID) | ORAL | 0 refills | Status: AC | PRN
Start: 2023-01-19 — End: ?

## 2023-01-19 NOTE — Progress Notes (Signed)
   01/19/23 1602  [facility] Triage Screening (Walk-ins at [facility] only)  How Did You Hear About [place]? Family/Friend  What Is the Reason for Your Visit/Call Today? [name] is a 77 year old female presenting to [facility] unaccompanied. Pt reports that she feels lost. Pt is diagnosed with ADD and Moderate Anxiety. Pt mentions she is trying to put a plan together and find a new place to live. Pt states, " I am so lost and fearful." Pt reports she is sleeping too much which could be causing her to feel depressed. Pt mentions that she has a therapist but is interested in finding a new one. Pt is not taking medication for her anxiety. Pt reports she has had thoughts of wanting to hurt herself for months. Pt begins to state, "I just want to die". Pt denies self harming and past suicide attempts. Pt is looking to find a depression support group, new therapist, psychiatrist, and new apartment at this time of assessment. Pt also denies substance use, HI and AVH.  How Long Has This Been Causing You Problems? <Week  Have You Recently Had Any Thoughts About Hurting Yourself? Yes  How long ago did you have thoughts about hurting yourself? today  Are You Planning to Commit Suicide/Harm Yourself At This time? No  Have you Recently Had Thoughts About Hurting Someone Karolee Ohs? No  Are You Planning To Harm Someone At This Time? No  Are you currently experiencing any auditory, visual or other hallucinations? No  Have You Used Any Alcohol or Drugs in the Past 24 Hours? No  Do you have any current medical co-morbidities that require immediate attention? No  Clinician description of patient physical appearance/behavior: tearful, anxious  What Do You Feel Would Help You the Most Today? Medication(s);Treatment for Depression or other mood problem;Stress Management  If access to [facility] was not available, would you have sought care in the Emergency Department? No  Determination of Need Routine (7 days)  Options For Referral  Intensive Outpatient Therapy;Medication Management
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
## 2023-01-19 NOTE — Discharge Instructions (Addendum)

## 2023-01-21 ENCOUNTER — Telehealth (HOSPITAL_COMMUNITY): Payer: Self-pay | Admitting: Licensed Clinical Social Worker

## 2023-01-21 DIAGNOSIS — R12 Heartburn: Secondary | ICD-10-CM | POA: Diagnosis not present

## 2023-01-25 ENCOUNTER — Other Ambulatory Visit (HOSPITAL_COMMUNITY): Payer: Medicare Other

## 2023-01-25 ENCOUNTER — Telehealth (HOSPITAL_COMMUNITY): Payer: Self-pay | Admitting: Professional

## 2023-01-27 ENCOUNTER — Telehealth: Payer: Self-pay | Admitting: Medical Oncology

## 2023-01-27 NOTE — Telephone Encounter (Signed)
I told pt that [name] wants to keep her CT at 6 months and that a copy of her scan is in the mailbox to her address. She was told to contact surgeon -a copy was faxed to him. She said she is waiting for his return call.

## 2023-01-28 ENCOUNTER — Telehealth: Payer: Self-pay | Admitting: Internal Medicine

## 2023-01-28 NOTE — Telephone Encounter (Signed)
Called patient regarding upcoming appointments, left a voicemail. 

## 2023-02-01 DIAGNOSIS — E7800 Pure hypercholesterolemia, unspecified: Secondary | ICD-10-CM | POA: Diagnosis not present

## 2023-02-01 DIAGNOSIS — J302 Other seasonal allergic rhinitis: Secondary | ICD-10-CM | POA: Diagnosis not present

## 2023-02-01 DIAGNOSIS — I10 Essential (primary) hypertension: Secondary | ICD-10-CM | POA: Diagnosis not present

## 2023-02-01 DIAGNOSIS — Z23 Encounter for immunization: Secondary | ICD-10-CM | POA: Diagnosis not present

## 2023-02-01 DIAGNOSIS — R9389 Abnormal findings on diagnostic imaging of other specified body structures: Secondary | ICD-10-CM | POA: Diagnosis not present

## 2023-02-09 ENCOUNTER — Other Ambulatory Visit: Payer: Self-pay | Admitting: Gastroenterology

## 2023-02-09 DIAGNOSIS — R1319 Other dysphagia: Secondary | ICD-10-CM

## 2023-02-22 DIAGNOSIS — R5383 Other fatigue: Secondary | ICD-10-CM | POA: Diagnosis not present

## 2023-02-22 DIAGNOSIS — R631 Polydipsia: Secondary | ICD-10-CM | POA: Diagnosis not present

## 2023-02-22 DIAGNOSIS — R682 Dry mouth, unspecified: Secondary | ICD-10-CM | POA: Diagnosis not present

## 2023-02-28 DIAGNOSIS — G629 Polyneuropathy, unspecified: Secondary | ICD-10-CM | POA: Diagnosis not present

## 2023-02-28 DIAGNOSIS — R42 Dizziness and giddiness: Secondary | ICD-10-CM | POA: Diagnosis not present

## 2023-02-28 DIAGNOSIS — M79604 Pain in right leg: Secondary | ICD-10-CM | POA: Diagnosis not present

## 2023-02-28 DIAGNOSIS — R2689 Other abnormalities of gait and mobility: Secondary | ICD-10-CM | POA: Diagnosis not present

## 2023-03-21 ENCOUNTER — Telehealth: Payer: Self-pay | Admitting: Cardiology

## 2023-03-21 NOTE — Telephone Encounter (Signed)
Patient is requesting call back to discuss one of her other doctors stating that she has heart failure. She is requesting to speak to someone in regards to this to see if this is correct. Requesting call back.

## 2023-03-21 NOTE — Telephone Encounter (Signed)
 Called patient with no answer. Left message to return call.

## 2023-03-22 NOTE — Telephone Encounter (Addendum)
 Echo 01/2022 reviewed with normal LVEF 55-60%, no RWMA, moderate LVH, indeteriminate diastolic paramaters. Prior echocardiograms have reported grade 1 diastolic dysfunction. Unless symptomatic with increased edema, dyspnea and requiring diuretics would not consider grade 1 diastolic dysfunction to be heart failure.   Detailed VM left per DPR. Encouraged her to call us  back if she has further questions and to schedule overdue follow up.   Monick Rena S Azhar Knope, NP

## 2023-03-28 ENCOUNTER — Other Ambulatory Visit: Payer: Self-pay | Admitting: Family Medicine

## 2023-03-28 DIAGNOSIS — R9389 Abnormal findings on diagnostic imaging of other specified body structures: Secondary | ICD-10-CM

## 2023-03-29 ENCOUNTER — Telehealth: Payer: Self-pay | Admitting: Medical Oncology

## 2023-03-29 NOTE — Telephone Encounter (Addendum)
 Pt wants your opinion. Does she need a CT chest before April.   Dr Tenny Craw wants her to get a chest CT soon to f/u October CT .   CT report faxed to Dr Nancie Neas at [facility].

## 2023-03-30 ENCOUNTER — Telehealth: Payer: Self-pay | Admitting: Medical Oncology

## 2023-03-30 ENCOUNTER — Encounter: Payer: Self-pay | Admitting: Medical Oncology

## 2023-03-30 NOTE — Telephone Encounter (Signed)
 I lvm on pts phone. Dr. Arbutus Ped said "I do not see any need for her to have scan sooner but if her primary care physician sees something different it is okay to proceed with the scan".

## 2023-04-04 DIAGNOSIS — H25811 Combined forms of age-related cataract, right eye: Secondary | ICD-10-CM | POA: Diagnosis not present

## 2023-04-05 DIAGNOSIS — M79606 Pain in leg, unspecified: Secondary | ICD-10-CM | POA: Diagnosis not present

## 2023-04-18 DIAGNOSIS — M79605 Pain in left leg: Secondary | ICD-10-CM | POA: Diagnosis not present

## 2023-04-18 DIAGNOSIS — M79604 Pain in right leg: Secondary | ICD-10-CM | POA: Diagnosis not present

## 2023-05-24 DIAGNOSIS — R6883 Chills (without fever): Secondary | ICD-10-CM | POA: Diagnosis not present

## 2023-05-24 DIAGNOSIS — Z03818 Encounter for observation for suspected exposure to other biological agents ruled out: Secondary | ICD-10-CM | POA: Diagnosis not present

## 2023-05-24 DIAGNOSIS — K219 Gastro-esophageal reflux disease without esophagitis: Secondary | ICD-10-CM | POA: Diagnosis not present

## 2023-05-24 DIAGNOSIS — R110 Nausea: Secondary | ICD-10-CM | POA: Diagnosis not present

## 2023-05-25 ENCOUNTER — Emergency Department (HOSPITAL_COMMUNITY)
Admission: EM | Admit: 2023-05-25 | Discharge: 2023-05-25 | Disposition: A | Discharge status: Home / Self Care | Attending: Emergency Medicine | Admitting: Emergency Medicine

## 2023-05-25 ENCOUNTER — Other Ambulatory Visit: Payer: Self-pay

## 2023-05-25 DIAGNOSIS — R110 Nausea: Secondary | ICD-10-CM | POA: Diagnosis not present

## 2023-05-25 DIAGNOSIS — Z743 Need for continuous supervision: Secondary | ICD-10-CM | POA: Diagnosis not present

## 2023-05-25 DIAGNOSIS — R42 Dizziness and giddiness: Secondary | ICD-10-CM | POA: Diagnosis not present

## 2023-05-25 DIAGNOSIS — I499 Cardiac arrhythmia, unspecified: Secondary | ICD-10-CM | POA: Diagnosis not present

## 2023-05-25 DIAGNOSIS — R6889 Other general symptoms and signs: Secondary | ICD-10-CM | POA: Diagnosis not present

## 2023-05-25 LAB — COMPREHENSIVE METABOLIC PANEL
ALT: 12 U/L (ref 0–44)
AST: 17 U/L (ref 15–41)
Albumin: 3.3 g/dL — ABNORMAL LOW (ref 3.5–5.0)
Alkaline Phosphatase: 62 U/L (ref 38–126)
Anion gap: 10 (ref 5–15)
BUN: 24 mg/dL — ABNORMAL HIGH (ref 8–23)
CO2: 22 mmol/L (ref 22–32)
Calcium: 8.9 mg/dL (ref 8.9–10.3)
Chloride: 107 mmol/L (ref 98–111)
Creatinine, Ser: 1.31 mg/dL — ABNORMAL HIGH (ref 0.44–1.00)
GFR, Estimated: 42 mL/min — ABNORMAL LOW (ref >60)
Glucose, Bld: 108 mg/dL — ABNORMAL HIGH (ref 70–99)
Potassium: 3.8 mmol/L (ref 3.5–5.1)
Sodium: 139 mmol/L (ref 135–145)
Total Bilirubin: 0.7 mg/dL (ref 0.0–1.2)
Total Protein: 6.2 g/dL — ABNORMAL LOW (ref 6.5–8.1)

## 2023-05-25 LAB — CBC WITH DIFFERENTIAL/PLATELET
Abs Immature Granulocytes: 0.02 10*3/uL (ref 0.00–0.07)
Basophils Absolute: 0 10*3/uL (ref 0.0–0.1)
Basophils Relative: 0 %
Eosinophils Absolute: 0.2 10*3/uL (ref 0.0–0.5)
Eosinophils Relative: 3 %
HCT: 39.2 % (ref 36.0–46.0)
Hemoglobin: 12.5 g/dL (ref 12.0–15.0)
Immature Granulocytes: 0 %
Lymphocytes Relative: 23 %
Lymphs Abs: 1.4 10*3/uL (ref 0.7–4.0)
MCH: 28.2 pg (ref 26.0–34.0)
MCHC: 31.9 g/dL (ref 30.0–36.0)
MCV: 88.3 fL (ref 80.0–100.0)
Monocytes Absolute: 0.4 10*3/uL (ref 0.1–1.0)
Monocytes Relative: 7 %
Neutro Abs: 4.2 10*3/uL (ref 1.7–7.7)
Neutrophils Relative %: 67 %
Platelets: 215 10*3/uL (ref 150–400)
RBC: 4.44 MIL/uL (ref 3.87–5.11)
RDW: 13.3 % (ref 11.5–15.5)
WBC: 6.2 10*3/uL (ref 4.0–10.5)
nRBC: 0 % (ref 0.0–0.2)

## 2023-05-25 LAB — LIPASE, BLOOD: Lipase: 33 U/L (ref 11–51)

## 2023-05-25 MED ORDER — ONDANSETRON HCL 4 MG/2ML IJ SOLN
4.0000 mg | Freq: Once | INTRAMUSCULAR | Status: AC
  Administered 2023-05-25: 4 mg via INTRAVENOUS
  Filled 2023-05-25: qty 2

## 2023-05-25 MED ORDER — LACTATED RINGERS IV BOLUS
1000.0000 mL | Freq: Once | INTRAVENOUS | Status: AC
  Administered 2023-05-25: 1000 mL via INTRAVENOUS

## 2023-05-25 NOTE — ED Notes (Signed)
Pt tolerating gingerale and crackers

## 2023-05-25 NOTE — ED Triage Notes (Signed)
 Pt BIBA from home for dizziness. Thinks she took too many of her meds this morning at the same time but the only additional med seems to be muscle relaxer. Pt states she has been feeling bad for 4 day, went to dr yesterday and was viral tested but does not know results yet. Reports chills and some nausea. 20ga RH, 500cc NS.  86/50 better on arrival HR 52

## 2023-05-25 NOTE — ED Notes (Signed)
 Pt ambulated to restroom with no assistant.

## 2023-05-25 NOTE — ED Notes (Signed)
 Pt states " I took a lot of pills that's why I'm here"

## 2023-05-25 NOTE — ED Provider Notes (Signed)
 [place] [facility] AT [facility] Provider Note   CSN: [id number] Arrival date & time: 05/25/23  1022     History Chief Complaint  Patient presents with   Dizziness    HPI [name] is a 78 y.o. female presenting for chief complaint of lightheadedness/dizziness.  78 year old female expansive medical history.  States that this morning she took her Zanaflex and her metoprolol and approximately 30 minutes later began feeling very lightheaded.  She remembers that she took the same medications last night and her pillbox was filled wrong. When EMS arrived she was blood pressure 80s over 40s heart rate in the 40s. Started on IV fluid grossly improved by time of arrival here.   Patient's recorded medical, surgical, social, medication list and allergies were reviewed in the Snapshot window as part of the initial history.   Review of Systems   Review of Systems  Constitutional:  Negative for chills and fever.  HENT:  Negative for ear pain and sore throat.   Eyes:  Negative for pain and visual disturbance.  Respiratory:  Negative for cough and shortness of breath.   Cardiovascular:  Negative for chest pain and palpitations.  Gastrointestinal:  Negative for abdominal pain and vomiting.  Genitourinary:  Negative for dysuria and hematuria.  Musculoskeletal:  Negative for arthralgias and back pain.  Skin:  Negative for color change and rash.  Neurological:  Positive for dizziness and light-headedness. Negative for seizures and syncope.  All other systems reviewed and are negative.   Physical Exam Updated Vital Signs BP 109/64 (BP Location: Right Arm)   Pulse (!) 46   Temp 97.7 F (36.5 C) (Oral)   Resp 18   SpO2 100%  Physical Exam Vitals and nursing note reviewed.  Constitutional:      General: She is not in acute distress.    Appearance: She is well-developed.  HENT:     Head: Normocephalic and atraumatic.  Eyes:     Conjunctiva/sclera:  Conjunctivae normal.  Cardiovascular:     Rate and Rhythm: Normal rate and regular rhythm.     Heart sounds: No murmur heard. Pulmonary:     Effort: Pulmonary effort is normal. No respiratory distress.     Breath sounds: Normal breath sounds.  Abdominal:     General: There is no distension.     Palpations: Abdomen is soft.     Tenderness: There is no abdominal tenderness. There is no right CVA tenderness or left CVA tenderness.  Musculoskeletal:        General: No swelling or tenderness. Normal range of motion.     Cervical back: Neck supple.  Skin:    General: Skin is warm and dry.  Neurological:     General: No focal deficit present.     Mental Status: She is alert and oriented to person, place, and time. Mental status is at baseline.     Cranial Nerves: No cranial nerve deficit.      ED Course/ Medical Decision Making/ A&P    Procedures Procedures   Medications Ordered in ED Medications  ondansetron (ZOFRAN) injection 4 mg (4 mg Intravenous Given 05/25/23 1143)  lactated ringers bolus 1,000 mL (0 mLs Intravenous Stopped 05/25/23 1247)    Medical Decision Making:   78 year old female presenting with lightheadedness and dizziness. Hypotensive and bradycardic per EMS but improving on arrival here.  She remained bradycardic but her blood pressure improved. History present on his physical exam findings are most consistent with medication effect  versus cardiac abnormality. Will evaluate with EKG, CBC/CMP for evaluation for toxic metabolic syndrome or other acute pathology.  Will give fluids/Zofran trial p.o. intake and plan for reassessment after observation window  Reassessment: Patient observed in the emergency room for 2 and half hours. She was able to ambulate to the restroom independently, blood pressure gradually climbed up though heart rate stayed roughly the same. She states that she has been having low heart rate readings at home and plans to follow with her  cardiologist to talk about decreasing her metoprolol dose. On a normal day however she is asymptomatic.  I recommended she hold off on any further Zanaflex until she follows up with her cardiologist/PCP and recommend continued ongoing outpatient care and management. Patient requesting discharge as she is symptomatically resolved after her observation window and feels comfortable outpatient care management.  Disposition:  I have considered need for hospitalization, however, considering all of the above, I believe this patient is stable for discharge at this time.  Patient/family educated about specific return precautions for given chief complaint and symptoms.  Patient/family educated about follow-up with PCP.     Patient/family expressed understanding of return precautions and need for follow-up. Patient spoken to regarding all imaging and laboratory results and appropriate follow up for these results. All education provided in verbal form with additional information in written form. Time was allowed for answering of patient questions. Patient discharged.    Emergency Department Medication Summary:   Medications  ondansetron [facility]) injection 4 mg (4 mg Intravenous Given 05/25/23 1143)  lactated ringers bolus 1,000 mL (0 mLs Intravenous Stopped 05/25/23 1247)        Clinical Impression:  1. Lightheadedness      Discharge   Final Clinical Impression(s) / ED Diagnoses Final diagnoses:  Lightheadedness    Rx / DC Orders ED Discharge Orders     None         Glyn Ade, MD 05/25/23 1304

## 2023-05-28 ENCOUNTER — Other Ambulatory Visit: Payer: Self-pay | Admitting: Pulmonary Disease

## 2023-05-28 DIAGNOSIS — G2581 Restless legs syndrome: Secondary | ICD-10-CM

## 2023-07-13 ENCOUNTER — Inpatient Hospital Stay: Payer: Medicare Other | Attending: Physician Assistant

## 2023-07-13 ENCOUNTER — Encounter (HOSPITAL_COMMUNITY): Payer: Self-pay

## 2023-07-13 ENCOUNTER — Ambulatory Visit (HOSPITAL_COMMUNITY)
Admission: RE | Admit: 2023-07-13 | Discharge: 2023-07-13 | Disposition: A | Discharge status: Home / Self Care | Source: Ambulatory Visit | Attending: Physician Assistant | Admitting: Physician Assistant

## 2023-07-13 DIAGNOSIS — C641 Malignant neoplasm of right kidney, except renal pelvis: Secondary | ICD-10-CM | POA: Diagnosis not present

## 2023-07-13 DIAGNOSIS — Z85528 Personal history of other malignant neoplasm of kidney: Secondary | ICD-10-CM | POA: Insufficient documentation

## 2023-07-13 DIAGNOSIS — R918 Other nonspecific abnormal finding of lung field: Secondary | ICD-10-CM | POA: Diagnosis not present

## 2023-07-13 DIAGNOSIS — R911 Solitary pulmonary nodule: Secondary | ICD-10-CM | POA: Insufficient documentation

## 2023-07-13 DIAGNOSIS — K8020 Calculus of gallbladder without cholecystitis without obstruction: Secondary | ICD-10-CM | POA: Diagnosis not present

## 2023-07-13 DIAGNOSIS — K5750 Diverticulosis of both small and large intestine without perforation or abscess without bleeding: Secondary | ICD-10-CM | POA: Diagnosis not present

## 2023-07-13 DIAGNOSIS — Z905 Acquired absence of kidney: Secondary | ICD-10-CM | POA: Insufficient documentation

## 2023-07-13 LAB — CMP (CANCER CENTER ONLY)
ALT: 11 U/L (ref 0–44)
AST: 15 U/L (ref 15–41)
Albumin: 4.2 g/dL (ref 3.5–5.0)
Alkaline Phosphatase: 90 U/L (ref 38–126)
Anion gap: 8 (ref 5–15)
BUN: 22 mg/dL (ref 8–23)
CO2: 27 mmol/L (ref 22–32)
Calcium: 10 mg/dL (ref 8.9–10.3)
Chloride: 105 mmol/L (ref 98–111)
Creatinine: 1.07 mg/dL — ABNORMAL HIGH (ref 0.44–1.00)
GFR, Estimated: 53 mL/min — ABNORMAL LOW (ref >60)
Glucose, Bld: 137 mg/dL — ABNORMAL HIGH (ref 70–99)
Potassium: 4.3 mmol/L (ref 3.5–5.1)
Sodium: 140 mmol/L (ref 135–145)
Total Bilirubin: 0.4 mg/dL (ref 0.0–1.2)
Total Protein: 7.2 g/dL (ref 6.5–8.1)

## 2023-07-13 LAB — CBC WITH DIFFERENTIAL (CANCER CENTER ONLY)
Abs Immature Granulocytes: 0.01 10*3/uL (ref 0.00–0.07)
Basophils Absolute: 0 10*3/uL (ref 0.0–0.1)
Basophils Relative: 1 %
Eosinophils Absolute: 0.2 10*3/uL (ref 0.0–0.5)
Eosinophils Relative: 4 %
HCT: 40.4 % (ref 36.0–46.0)
Hemoglobin: 13.4 g/dL (ref 12.0–15.0)
Immature Granulocytes: 0 %
Lymphocytes Relative: 29 %
Lymphs Abs: 1.7 10*3/uL (ref 0.7–4.0)
MCH: 27.4 pg (ref 26.0–34.0)
MCHC: 33.2 g/dL (ref 30.0–36.0)
MCV: 82.6 fL (ref 80.0–100.0)
Monocytes Absolute: 0.4 10*3/uL (ref 0.1–1.0)
Monocytes Relative: 6 %
Neutro Abs: 3.5 10*3/uL (ref 1.7–7.7)
Neutrophils Relative %: 60 %
Platelet Count: 255 10*3/uL (ref 150–400)
RBC: 4.89 MIL/uL (ref 3.87–5.11)
RDW: 13.9 % (ref 11.5–15.5)
WBC Count: 5.8 10*3/uL (ref 4.0–10.5)
nRBC: 0 % (ref 0.0–0.2)

## 2023-07-14 ENCOUNTER — Other Ambulatory Visit: Payer: Self-pay

## 2023-07-14 ENCOUNTER — Emergency Department (HOSPITAL_COMMUNITY)
Admission: EM | Admit: 2023-07-14 | Discharge: 2023-07-14 | Disposition: A | Discharge status: Home / Self Care | Attending: Emergency Medicine | Admitting: Emergency Medicine

## 2023-07-14 ENCOUNTER — Emergency Department (HOSPITAL_COMMUNITY)

## 2023-07-14 DIAGNOSIS — R42 Dizziness and giddiness: Secondary | ICD-10-CM | POA: Insufficient documentation

## 2023-07-14 DIAGNOSIS — I6523 Occlusion and stenosis of bilateral carotid arteries: Secondary | ICD-10-CM | POA: Diagnosis not present

## 2023-07-14 DIAGNOSIS — H579 Unspecified disorder of eye and adnexa: Secondary | ICD-10-CM | POA: Diagnosis not present

## 2023-07-14 DIAGNOSIS — H538 Other visual disturbances: Secondary | ICD-10-CM | POA: Insufficient documentation

## 2023-07-14 DIAGNOSIS — Z5329 Procedure and treatment not carried out because of patient's decision for other reasons: Secondary | ICD-10-CM | POA: Insufficient documentation

## 2023-07-14 DIAGNOSIS — R6889 Other general symptoms and signs: Secondary | ICD-10-CM | POA: Diagnosis not present

## 2023-07-14 DIAGNOSIS — Z743 Need for continuous supervision: Secondary | ICD-10-CM | POA: Diagnosis not present

## 2023-07-14 DIAGNOSIS — R404 Transient alteration of awareness: Secondary | ICD-10-CM | POA: Diagnosis not present

## 2023-07-14 LAB — CBC WITH DIFFERENTIAL/PLATELET
Abs Immature Granulocytes: 0.01 10*3/uL (ref 0.00–0.07)
Basophils Absolute: 0 10*3/uL (ref 0.0–0.1)
Basophils Relative: 1 %
Eosinophils Absolute: 0.2 10*3/uL (ref 0.0–0.5)
Eosinophils Relative: 3 %
HCT: 40.5 % (ref 36.0–46.0)
Hemoglobin: 13.3 g/dL (ref 12.0–15.0)
Immature Granulocytes: 0 %
Lymphocytes Relative: 25 %
Lymphs Abs: 1.3 10*3/uL (ref 0.7–4.0)
MCH: 27.9 pg (ref 26.0–34.0)
MCHC: 32.8 g/dL (ref 30.0–36.0)
MCV: 85.1 fL (ref 80.0–100.0)
Monocytes Absolute: 0.4 10*3/uL (ref 0.1–1.0)
Monocytes Relative: 8 %
Neutro Abs: 3.4 10*3/uL (ref 1.7–7.7)
Neutrophils Relative %: 63 %
Platelets: 236 10*3/uL (ref 150–400)
RBC: 4.76 MIL/uL (ref 3.87–5.11)
RDW: 14 % (ref 11.5–15.5)
WBC: 5.3 10*3/uL (ref 4.0–10.5)
nRBC: 0 % (ref 0.0–0.2)

## 2023-07-14 LAB — BASIC METABOLIC PANEL WITH GFR
Anion gap: 11 (ref 5–15)
BUN: 16 mg/dL (ref 8–23)
CO2: 24 mmol/L (ref 22–32)
Calcium: 9.2 mg/dL (ref 8.9–10.3)
Chloride: 105 mmol/L (ref 98–111)
Creatinine, Ser: 1 mg/dL (ref 0.44–1.00)
GFR, Estimated: 58 mL/min — ABNORMAL LOW (ref >60)
Glucose, Bld: 87 mg/dL (ref 70–99)
Potassium: 4.4 mmol/L (ref 3.5–5.1)
Sodium: 140 mmol/L (ref 135–145)

## 2023-07-14 LAB — MAGNESIUM: Magnesium: 2.1 mg/dL (ref 1.7–2.4)

## 2023-07-14 MED ORDER — IOHEXOL 350 MG/ML SOLN
75.0000 mL | Freq: Once | INTRAVENOUS | Status: AC | PRN
  Administered 2023-07-14: 75 mL via INTRAVENOUS

## 2023-07-14 NOTE — ED Provider Notes (Signed)
 [place] [facility]GENC[facility] DEPARTMENT AT [facility] Provider Note   CSN: [id number] Arrival date & time: 07/14/23  1654    History  Chief Complaint  Patient presents with   Dizziness    [name] is a 78 y.o. female here for evaluation of not feeling right.  She states yesterday she was reading in the words got all jumbled.  She states she is able to see the words on the paper however she could not comprehend them.  States she also had some dizziness at that time.  Symptoms lasted a few seconds and self resolved.  Again had similar episode.  Apparently she called her PCP around 3 and they thought she possibly had slurred speech and 911 was called.  She denies any headaches, slurred speech, weakness or numbness.  She has difficulty describing her blurred vision.  States is not a visual field cut.  She thinks this was occurring in both eyes.  Did not close either went to see if she could read any better.  She notes she has been under a lot of stress.  Very tearful in room.  No sudden onset thunderclap headache, chest pain, shortness of breath, abdominal pain.  Eating and drinking normally.  No history of similar.  She has noted her blood pressure has been elevated at home over the last few days.  Also cannot describe her dizziness.  It does have positional component, worse when she sits up.  HPI     Home Medications Prior to Admission medications   Medication Sig Start Date End Date Taking? Authorizing Provider  fluticasone (FLONASE) 50 MCG/ACT nasal spray Place 1 spray into both nostrils in the morning.   Yes [provider]  hydrOXYzine  (ATARAX ) 25 MG tablet Take 1 tablet (25 mg total) by mouth 3 (three) times daily as needed. Patient taking differently: Take 25 mg by mouth 3 (three) times daily as needed for anxiety. 01/19/23  Yes Gilman Lade, NP  levothyroxine  (SYNTHROID ) 88 MCG tablet Take 88 mcg by mouth daily before breakfast. 03/14/21  Yes [provider]  Lidocaine  HCl (ASPERCREME LIDOCAINE ) 4 % CREA Apply 1 application  topically 2 (two) times daily as needed (for pain).   Yes [provider]  losartan -hydrochlorothiazide  (HYZAAR) 50-12.5 MG tablet TAKE 1 TABLET BY MOUTH TWICE A DAY 01/26/22  Yes Sheryle Donning, MD  MAGNESIUM  PO Take 500 mg by mouth in the morning.   Yes [provider]  methylphenidate  (RITALIN ) 10 MG tablet Take 10 mg by mouth See admin instructions. Take 10 mg by mouth in the morning and at 2 PM- in conjunction with one 5 mg tablet to equal a total dose of 15 mg   Yes [provider]  methylphenidate  (RITALIN ) 5 MG tablet Take 5 mg by mouth See admin instructions. Take 5 mg by mouth in the morning and at 2 PM- in conjunction with one 10 mg tablet to equal a total dose of 15 mg   Yes [provider]  MINERAL ICE 2 % GEL Apply 1 application  topically 2 (two) times daily as needed (for pain).   Yes [provider]  Omega-3 Fatty Acids (FISH OIL PO) Take 1 capsule by mouth daily.   Yes [provider]  tiZANidine  (ZANAFLEX ) 2 MG tablet Take 2 mg by mouth at bedtime.   Yes [provider]  traMADol  (ULTRAM ) 50 MG tablet Take 50 mg by mouth 2 (two) times daily as needed (for pain).  Yes [provider]  TYLENOL  8 HOUR ARTHRITIS PAIN 650 MG CR tablet Take 650 mg by mouth every 8 (eight) hours as needed for pain.   Yes [provider]  B Complex Vitamins (B COMPLEX PO) Take 1 mL by mouth daily.    [provider]  metoprolol  tartrate (LOPRESSOR ) 25 MG tablet Take 25 mg by mouth daily. 10/15/20   [provider]  promethazine  (PHENERGAN ) 25 MG tablet Take 25 mg by mouth as needed for nausea or vomiting. 02/05/21   [provider]  rOPINIRole  (REQUIP ) 0.5 MG tablet TAKE 1 TABLET BY MOUTH EVERY DAY AFTER SUPPER AND TAKE 2 TABLETS AT BEDTIME Patient taking differently: Take 0.5-1 mg by mouth at bedtime. 05/17/22    Lind Repine, MD  rosuvastatin  (CRESTOR ) 5 MG tablet Take 1 tablet (5 mg total) by mouth daily. Patient not taking: Reported on 07/14/2023 11/09/19 07/14/23  Sheryle Donning, MD  UNABLE TO FIND Take 60 mg by mouth at bedtime. Med Name: Delta [id number]    [provider]      Allergies    Geodon [ziprasidone hydrochloride], Lithium, Talwin [pentazocine], Toradol [ketorolac tromethamine], Abilify  [aripiprazole ], Compazine [prochlorperazine edisylate], Latuda  [lurasidone  hcl], Pristiq  [desvenlafaxine  succinate er], Rexulti  [brexpiprazole ], and Diflucan [fluconazole]    Review of Systems   Review of Systems  Constitutional: Negative.   HENT: Negative.    Eyes:  Positive for visual disturbance. Negative for photophobia.  Respiratory: Negative.    Cardiovascular: Negative.   Gastrointestinal: Negative.   Genitourinary: Negative.   Musculoskeletal: Negative.   Skin: Negative.   Neurological:  Positive for dizziness and light-headedness. Negative for tremors, seizures, syncope, facial asymmetry, speech difficulty, weakness, numbness and headaches.  All other systems reviewed and are negative.  Physical Exam Updated Vital Signs BP (!) 111/45   Pulse 77   Temp 98 F (36.7 C) (Oral)   Resp 16   Ht 5\' 3"  (1.6 m)   Wt 99.8 kg   SpO2 98%   BMI 38.97 kg/m  Physical Exam Vitals and nursing note reviewed.  Constitutional:      General: She is not in acute distress.    Appearance: She is well-developed. She is not ill-appearing, toxic-appearing or diaphoretic.  HENT:     Head: Normocephalic and atraumatic.     Nose: Nose normal.     Mouth/Throat:     Mouth: Mucous membranes are moist.  Eyes:     Pupils: Pupils are equal, round, and reactive to light.  Cardiovascular:     Rate and Rhythm: Normal rate.  Pulmonary:     Effort: Pulmonary effort is normal. No respiratory distress.     Breath sounds: Normal breath sounds.  Abdominal:     General: Bowel sounds are normal. There is  no distension.     Palpations: Abdomen is soft.  Musculoskeletal:        General: No swelling, tenderness, deformity or signs of injury. Normal range of motion.     Cervical back: Normal range of motion.  Skin:    General: Skin is warm and dry.     Capillary Refill: Capillary refill takes less than 2 seconds.  Neurological:     Mental Status: She is alert.     Cranial Nerves: Cranial nerves 2-12 are intact.     Sensory: Sensation is intact.     Motor: Motor function is intact.     Coordination: Coordination is intact.     Gait: Gait is intact.  Comments: CN 2-12 grossly intact Neg F2N, Heel to shin No nystagmus Neg rhomburg   Psychiatric:        Mood and Affect: Mood normal. Affect is tearful.     Comments: Tearful, denies SI, HI, AVH    ED Results / Procedures / Treatments   Labs (all labs ordered are listed, but only abnormal results are displayed) Labs Reviewed  BASIC METABOLIC PANEL WITH GFR - Abnormal; Notable for the following components:      Result Value   GFR, Estimated 58 (*)    All other components within normal limits  CBC WITH DIFFERENTIAL/PLATELET  MAGNESIUM     EKG EKG Interpretation Date/Time:  Thursday July 14 2023 17:25:11 EDT Ventricular Rate:  85 PR Interval:  129 QRS Duration:  101 QT Interval:  380 QTC Calculation: 452 R Axis:   49  Text Interpretation: Sinus rhythm No significant change since last tracing Confirmed by Celesta Coke ([id number]) on 07/14/2023 5:58:42 PM  Radiology CT ANGIO HEAD NECK W WO CM Result Date: 07/14/2023 CLINICAL DATA:  Vertigo EXAM: CT ANGIOGRAPHY HEAD AND NECK WITH AND WITHOUT CONTRAST TECHNIQUE: Multidetector CT imaging of the head and neck was performed using the standard protocol during bolus administration of intravenous contrast. Multiplanar CT image reconstructions and MIPs were obtained to evaluate the vascular anatomy. Carotid stenosis measurements (when applicable) are obtained utilizing NASCET criteria,  using the distal internal carotid diameter as the denominator. RADIATION DOSE REDUCTION: This exam was performed according to the departmental dose-optimization program which includes automated exposure control, adjustment of the mA and/or kV according to patient size and/or use of iterative reconstruction technique. CONTRAST:  75mL OMNIPAQUE  IOHEXOL  350 MG/ML SOLN COMPARISON:  None Available. FINDINGS: CT HEAD FINDINGS Brain: No mass,hemorrhage or extra-axial collection. Normal appearance of the parenchyma and CSF spaces. Vascular: No hyperdense vessel or unexpected vascular calcification. Skull: The visualized skull base, calvarium and extracranial soft tissues are normal. Sinuses/Orbits: No fluid levels or advanced mucosal thickening of the visualized paranasal sinuses. No mastoid or middle ear effusion. Normal orbits. CTA NECK FINDINGS Skeleton: No acute abnormality or high grade bony spinal canal stenosis. Other neck: Normal pharynx, larynx and major salivary glands. No cervical lymphadenopathy. Unremarkable thyroid  gland. Upper chest: No pneumothorax or pleural effusion. No nodules or masses. Aortic arch: There is no calcific atherosclerosis of the aortic arch. Conventional 3 vessel aortic branching pattern. RIGHT carotid system: No dissection, occlusion or aneurysm. Mild atherosclerotic calcification at the carotid bifurcation without hemodynamically significant stenosis. LEFT carotid system: No dissection, occlusion or aneurysm. Mild atherosclerotic calcification at the carotid bifurcation without hemodynamically significant stenosis. Vertebral arteries: Right dominant configuration. There is no dissection, occlusion or flow-limiting stenosis to the skull base (V1-V3 segments). CTA HEAD FINDINGS POSTERIOR CIRCULATION: Vertebral arteries are normal. No proximal occlusion of the anterior or inferior cerebellar arteries. Basilar artery is normal. Superior cerebellar arteries are normal. Posterior cerebral  arteries are normal. ANTERIOR CIRCULATION: Intracranial internal carotid arteries are normal. Anterior cerebral arteries are normal. Middle cerebral arteries are normal. Venous sinuses: As permitted by contrast timing, patent. Anatomic variants: None Review of the MIP images confirms the above findings. IMPRESSION: 1. No emergent large vessel occlusion or hemodynamically significant stenosis of the head or neck. 2. Mild bilateral carotid bifurcation atherosclerosis without hemodynamically significant stenosis. Electronically Signed   By: Juanetta Nordmann M.D.   On: 07/14/2023 21:55   CT CHEST ABDOMEN PELVIS WO CONTRAST Result Date: 07/13/2023 CLINICAL DATA:  History of renal cell carcinoma, follow-up pulmonary nodule seen  on prior examination. * Tracking Code: BO * EXAM: CT CHEST, ABDOMEN AND PELVIS WITHOUT CONTRAST TECHNIQUE: Multidetector CT imaging of the chest, abdomen and pelvis was performed following the standard protocol without IV contrast. RADIATION DOSE REDUCTION: This exam was performed according to the departmental dose-optimization program which includes automated exposure control, adjustment of the mA and/or kV according to patient size and/or use of iterative reconstruction technique. COMPARISON:  Multiple priors including most recent CT January 06, 2023 FINDINGS: CT CHEST FINDINGS Cardiovascular: Aortic atherosclerosis. Calcifications of the mitral annulus. Coronary artery calcifications. Normal size heart. Trace pericardial effusion similar to prior. Mediastinum/Nodes: No suspicious thyroid  nodule. No pathologically enlarged mediastinal, hilar or axillary lymph nodes. Lungs/Pleura: Subtle 3 mm pulmonary nodule in the left lung apex and 2 mm right middle lobe pulmonary nodule which were new on prior examination are not seen on today's examination. Additional scattered pulmonary nodules are stable for instance a 3 mm nodule in the anterior left upper lobe on image 58/6 and a 3 mm pulmonary nodule in  the left lower lobe on image 73/6. No new suspicious pulmonary nodules or masses. Scattered atelectasis/scarring. Musculoskeletal: No aggressive lytic or blastic lesion of bone. Stable subtle lucency in the T11 vertebral body, favored benign. Multilevel degenerative changes spine. Degenerative changes bilateral shoulders. CT ABDOMEN PELVIS FINDINGS Hepatobiliary: No suspicious hepatic lesion on noncontrast enhanced examination. Cholelithiasis. No biliary ductal dilation. Pancreas: No pancreatic ductal dilation or evidence of acute inflammation. Spleen: No splenomegaly. Adrenals/Urinary Tract: Bilateral adrenal glands appear normal. Right kidney is surgically absent without new suspicious soft tissue nodularity in the nephrectomy bed. Stable fluid density left renal cysts. No left-sided hydronephrosis. Stomach/Bowel: Stomach is unremarkable for degree of distension. Duodenal diverticula. Sigmoid colonic diverticulosis. No evidence of acute bowel inflammation. Vascular/Lymphatic: Normal caliber abdominal aorta. Smooth IVC contours. No pathologically enlarged abdominal or pelvic lymph nodes. Reproductive: Calcified uterine leiomyomas. Other: Similar soft tissue nodularity in the anterior abdominal wall favored postsurgical change. Musculoskeletal: No aggressive lytic or blastic lesion of bone. Stable sclerotic focus in the sacrum likely a bone island. IMPRESSION: 1. Subtle 3 mm pulmonary nodule in the left lung apex and 2 mm right middle lobe pulmonary nodule which were new on prior examination are not seen on today's examination, favored infectious/inflammatory. Additional scattered pulmonary nodules are stable. No new suspicious pulmonary nodules or masses. 2. No evidence of new or progressive disease in the chest, abdomen or pelvis. 3. Cholelithiasis. Electronically Signed   By: Tama Fails M.D.   On: 07/13/2023 15:46   Procedures Procedures    Medications Ordered in ED Medications  iohexol  (OMNIPAQUE )  350 MG/ML injection 75 mL (75 mLs Intravenous Contrast Given 07/14/23 2119)   ED Course/ Medical Decision Making/ A&P   78 year old here for evaluation of blurred vision.  Patient has difficult time describing this.  States apparently she was looking at the paper and she could not tell me if the words were blurred or she had visual field cut.  She states she has had some intermittent dizzy episodes which she relates to position however she has this chronically was seen 1 month ago for similar.  No slurred speech, no weakness, numbness.  Patient very tearful in the room.  States she is undergoing a lot of stressors at home.  Eating and drinking normally.  No recent falls or injuries.  EMS did not note any neurologic deficits on their arrival.  My initial evaluation shows a nonfocal neuroexam.  States the episode of blurred vision lasted a  few seconds yesterday and a few seconds today.  No pain.  Will plan on labs, imaging and reassess.  Patient is not a code stroke, Carloyn Chi negative  Labs and imaging personally viewed and interpreted:  CBC without leukocytosis Metabolic panel magnesium  clotted multiple times.  She did have labs performed yesterday at the cancer center which showed no significant abnormality. CTA head/neck negative for acute abnormality  Discussed results with patient. Rec further workup with MRI to r/o CVA as cause of dizziness, possible Neuro consult pending results.  Patient does not want MRI at this time, states she will follow-up with PCP at [facility].  I discussed risk versus benefit.  Patient voiced understanding and does not want further workup at this time.  Will have her follow-up outpatient, return for any worsening symptoms.  Patient will leave AGAINST MEDICAL ADVICE.  Metabolic panel, magnesium  without significant abnormality here.  We discussed the nature and purpose, risks and benefits, as well as, the alternatives of treatment. Time was given to allow the opportunity to ask  questions and consider their options, and after the discussion, the patient decided to refuse the offerred treatment. The patient was informed that refusal could lead to, but was not limited to, death, permanent disability, or severe pain. If present, I asked the relatives or significant others to dissuade them without success. Prior to refusing, I determined that the patient had the capacity to make their decision and understood the consequences of that decision. After refusal, I made every reasonable opportunity to treat them to the best of my ability.  The patient was notified that they may return to the emergency department at any time for further treatment.   Leaving AGAINST MEDICAL ADVICE                                 Medical Decision Making Amount and/or Complexity of Data Reviewed External Data Reviewed: labs, radiology, ECG and notes. Labs: ordered. Decision-making details documented in ED Course. Radiology: ordered and independent interpretation performed. Decision-making details documented in ED Course. ECG/medicine tests: ordered and independent interpretation performed. Decision-making details documented in ED Course.  Risk OTC drugs. Prescription drug management. Parenteral controlled substances. Decision regarding hospitalization. Diagnosis or treatment significantly limited by social determinants of health.          Final Clinical Impression(s) / ED Diagnoses Final diagnoses:  Dizziness    Rx / DC Orders ED Discharge Orders     None         Alexus Galka A, PA-C 07/14/23 2318    Kingsley, Victoria K, DO 07/20/23 1601

## 2023-07-14 NOTE — ED Triage Notes (Signed)
 Pt bib [facility] coming from home with intermittent dizziness and blurred vision. Pt called dr around 1500, dr noticed slurred speech and told her to call 911. EMS stroke screening negative. Pt reports hypertension for a few days but has taken her medication. Pt has no complaints at this time and no longer has slurred speech per EMS.   EMS VS: 140/90 148 cbg 100 HR 99% RA

## 2023-07-14 NOTE — Discharge Instructions (Signed)
 It was a pleasure taking care of you here in the emergency department  As discussed in the room he recommended MRI to rule out a stroke that could have been causing your dizziness.  You declined the MRI.  I would make sure to follow back up with your primary care provider  Return for any new or worsening symptoms

## 2023-07-15 DIAGNOSIS — R42 Dizziness and giddiness: Secondary | ICD-10-CM | POA: Diagnosis not present

## 2023-07-18 DIAGNOSIS — I10 Essential (primary) hypertension: Secondary | ICD-10-CM | POA: Diagnosis not present

## 2023-07-20 ENCOUNTER — Inpatient Hospital Stay: Payer: Medicare Other | Admitting: Internal Medicine

## 2023-07-20 VITALS — BP 139/74 | HR 91 | Temp 97.8°F | Resp 17 | Ht 63.0 in | Wt 225.6 lb

## 2023-07-20 DIAGNOSIS — C641 Malignant neoplasm of right kidney, except renal pelvis: Secondary | ICD-10-CM

## 2023-07-20 DIAGNOSIS — Z905 Acquired absence of kidney: Secondary | ICD-10-CM | POA: Insufficient documentation

## 2023-07-20 DIAGNOSIS — Z85528 Personal history of other malignant neoplasm of kidney: Secondary | ICD-10-CM | POA: Diagnosis not present

## 2023-07-20 NOTE — Progress Notes (Signed)
 [facility] Telephone:([phone]   [phone]  OFFICE PROGRESS NOTE  Jimmey Mould, MD [address] 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    PRIOR THERAPY: She is status post right radical nephrectomy completed by Dr. Valeta Gaudier on Jul 31, 2019. Final pathology showed a 6 cm clear-cell renal cell carcinoma.    CURRENT THERAPY: Active surveillance   INTERVAL HISTORY: [name] 78 y.o. female returns to the clinic today for follow-up visit.Discussed the use of AI scribe software for clinical note transcription with the patient, who gave verbal consent to proceed.  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  She underwent a right radical nephrectomy in May 2021 and has been on observation since. A repeat CT scan of the chest, abdomen, and pelvis was performed for restaging. The previous scan showed a tiny nodule on her lung, which is no longer present on the current scan. No new spots or concerning findings are noted.  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 She describes this as 'the worst spell' she has had in years, ongoing for about four months. She has undergone transcranial magnetic stimulation (TMS) without benefit, and a genetic study confirmed her medication resistance. She is currently under the care of a psychiatrist.  No chest pain, breathing issues, nausea, or vomiting. She mentions having some skin lesions on her body.       MEDICAL HISTORY: Past Medical History:  Diagnosis Date   Abscess of Bartholin's gland    Acquired hypothyroidism 1990   after partial thyroidectomy for thyroid  adenoma   Acute vestibular neuronitis     ADHD (attention deficit hyperactivity disorder)    Adverse effect of general anesthetic    felt paralyzed while receiving anesthesia   Arthritis    bilateral shoulders, knees and hips   Bursitis of right shoulder 05/24/2019   Right shoulder subacromial injection   CAD (coronary artery disease)    cath 05/2016 showing 50-70% stenosis in the mid LAD proximal to the first diagonal and 70-80% small OM1. FFR of LAD  not performed because of difficulty with catheter control from the right radial.    Chronic cough 02/22/2020   Chronic kidney disease    hx of kidney cancer   Diastolic dysfunction, left ventricle 05/31/2013   Difficult intubation    told by [facility] that she was hard to intubate 15 yrs ago in [place]- surgery since then no problems   Dyspnea    Essential hypertension 05/31/2013   Fibromyalgia    Generalized anxiety disorder    Adequate for discharge    GERD (gastroesophageal reflux disease)    History of attention deficit disorder    History of echocardiogram 07/2012   Normal LVF w grade I siastolic dysfunction    History of endometriosis    History of gall stones 12/31/2009   History of pleural effusion    Hyperlipidemia 06/24/2016   LDL goal <70   Hypertension    Hypothyroidism 1990   after partial thyroidectomy for thyroid  adenoma   Major depressive disorder    Obesity    Obstructive sleep apnea 02/20/2007   NPSG 2004:  AHI 10/hr Failed cpap trials.>dental appliance   Split night  06/2015 >Moderate obstructive sleep apnea occurred during this study  (AHI = 22.7/h).>rec CPAP >> poor compliance  HST 11/2016 AHI 12/h, 7h TST  03/2018 -office visit with Dr. Villa Greaser discussed inspire device, patient would need to lose weight   PAT (paroxysmal atrial tachycardia) (HCC)    s/p ablation   PONV (postoperative nausea and vomiting)    Pre-diabetes    PTSD (post-traumatic stress disorder)    Pure hypercholesterolemia 02/18/2019   PVC's (premature ventricular contractions)    Renal mass  02/15/2012   Restless legs syndrome (RLS) 02/20/2007   Vertigo     ALLERGIES:  is allergic to geodon [ziprasidone hydrochloride], lithium, talwin [pentazocine], toradol [ketorolac tromethamine], abilify  [aripiprazole ], compazine [prochlorperazine edisylate], latuda  [lurasidone  hcl], pristiq  [desvenlafaxine  succinate er], rexulti  [brexpiprazole ], and diflucan [fluconazole].  MEDICATIONS:  Current Outpatient Medications  Medication Sig Dispense Refill   B Complex Vitamins (B COMPLEX PO) Take 1 mL by mouth daily.     fluticasone (FLONASE) 50 MCG/ACT nasal spray Place 1 spray into both nostrils in the morning.     hydrOXYzine  (ATARAX ) 25 MG tablet Take 1 tablet (25 mg total) by mouth 3 (three) times daily as needed. (Patient taking differently: Take 25 mg by mouth 3 (three) times daily as needed for anxiety.) 30 tablet 0   levothyroxine  (SYNTHROID ) 88 MCG tablet Take 88 mcg by mouth daily before breakfast.     Lidocaine  HCl (ASPERCREME LIDOCAINE ) 4 % CREA Apply 1 application  topically 2 (two) times daily as needed (for pain).     losartan -hydrochlorothiazide  (HYZAAR) 50-12.5 MG tablet TAKE 1 TABLET BY MOUTH TWICE A DAY 90 tablet 1   MAGNESIUM  PO Take 500 mg by mouth in the morning.     methylphenidate  (RITALIN ) 10 MG tablet Take 10 mg by mouth See admin instructions. Take 10 mg by mouth in the morning and at 2 PM- in conjunction with one 5 mg tablet to equal a total dose of 15 mg     methylphenidate  (RITALIN ) 5 MG tablet Take 5 mg by mouth See admin instructions. Take 5 mg by mouth in the morning and at 2 PM- in conjunction with one 10 mg tablet to equal a total dose of 15 mg     metoprolol  tartrate (LOPRESSOR ) 25 MG tablet Take 25 mg by mouth daily.     MINERAL ICE 2 % GEL Apply 1 application  topically 2 (two) times daily as needed (for pain).     Omega-3 Fatty Acids (FISH OIL PO) Take 1 capsule by mouth daily.     promethazine  (PHENERGAN ) 25 MG tablet Take 25 mg by mouth as needed for nausea or  vomiting.     rOPINIRole  (REQUIP ) 0.5 MG tablet TAKE 1 TABLET BY MOUTH EVERY DAY AFTER SUPPER AND TAKE 2 TABLETS AT BEDTIME (Patient taking differently: Take 0.5-1 mg by mouth at bedtime.) 270 tablet 3   rosuvastatin  (CRESTOR ) 5 MG tablet Take 1 tablet (5 mg total) by mouth daily. (Patient not taking: Reported on 07/14/2023) 90 tablet 3   tiZANidine (ZANAFLEX) 2 MG tablet Take 2 mg by mouth at bedtime.     traMADol  (ULTRAM ) 50 MG tablet Take 50 mg by mouth 2 (two) times daily as needed (for pain).     TYLENOL  8 HOUR ARTHRITIS PAIN 650 MG CR tablet Take 650 mg by mouth every 8 (eight) hours as needed for pain.     UNABLE TO FIND Take 60 mg by mouth at bedtime. Med Name: Delta 8  No current facility-administered medications for this visit.    SURGICAL HISTORY:  Past Surgical History:  Procedure Laterality Date   CARDIAC ELECTROPHYSIOLOGY MAPPING AND ABLATION  2000s   INCISIONAL HERNIA REPAIR N/A 07/01/2022   Procedure: OPEN INCISIONAL HERNIA REPAIR, TAR PROCEDURE;  Surgeon: Shela Derby, MD;  Location: [facility] OR;  Service: General;  Laterality: N/A;   INSERTION OF MESH N/A 07/01/2022   Procedure: INSERTION OF MESH;  Surgeon: Shela Derby, MD;  Location: [facility] OR;  Service: General;  Laterality: N/A;   LAPAROSCOPIC NEPHRECTOMY, HAND ASSISTED Right 07/31/2019   Procedure: HAND ASSISTED LAPAROSCOPIC NEPHRECTOMY CONVERTED TO OPEN WITH REPAIR OF INFERIOR VENA CAVAOTOMY;  Surgeon: Roxane Copp, MD;  Location: [facility] ORS;  Service: Urology;  Laterality: Right;  3 HRS   laparoscopy     LEFT HEART CATH AND CORONARY ANGIOGRAPHY N/A 06/08/2016   Procedure: Left Heart Cath and Coronary Angiography;  Surgeon: Arty Binning, MD;  Location: [facility] INVASIVE CV LAB;  Service: Cardiovascular;  Laterality: N/A;   nasoseptal reconstruction  1990s   NEPHRECTOMY RECIPIENT Right 08/2019   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   THYROIDECTOMY  1990   TONSILLECTOMY     as a child   TUBAL LIGATION  1970s   uterine mass  removal  03/2012   was found to be benign    REVIEW OF SYSTEMS:  A comprehensive review of systems was negative except for: Constitutional: positive for fatigue Behavioral/Psych: positive for depression   PHYSICAL EXAMINATION: General appearance: alert, cooperative, fatigued, and no distress Head: Normocephalic, without obvious abnormality, atraumatic Neck: no adenopathy, no JVD, supple, symmetrical, trachea midline, and thyroid  not enlarged, symmetric, no tenderness/mass/nodules Lymph nodes: Cervical, supraclavicular, and axillary nodes normal. Resp: clear to auscultation bilaterally Back: symmetric, no curvature. ROM normal. No CVA tenderness. Cardio: regular rate and rhythm, S1, S2 normal, no murmur, click, rub or gallop GI: soft, non-tender; bowel sounds normal; no masses,  no organomegaly Extremities: extremities normal, atraumatic, no cyanosis or edema  ECOG PERFORMANCE STATUS: 1 - Symptomatic but completely ambulatory  There were no vitals taken for this visit.  LABORATORY DATA: Lab Results  Component Value Date   WBC 5.3 07/14/2023   HGB 13.3 07/14/2023   HCT 40.5 07/14/2023   MCV 85.1 07/14/2023   PLT 236 07/14/2023      Chemistry      Component Value Date/Time   NA 140 07/14/2023 2227   NA 139 10/20/2021 1613   NA 139 02/18/2015 1440   K 4.4 07/14/2023 2227   K 4.2 02/18/2015 1440   CL 105 07/14/2023 2227   CL 103 07/21/2012 1415   CO2 24 07/14/2023 2227   CO2 29 02/18/2015 1440   BUN 16 07/14/2023 2227   BUN 14 10/20/2021 1613   BUN 19.1 02/18/2015 1440   CREATININE 1.00 07/14/2023 2227   CREATININE 1.07 (H) 07/13/2023 0922   CREATININE 0.9 02/18/2015 1440      Component Value Date/Time   CALCIUM  9.2 07/14/2023 2227   CALCIUM  10.2 02/18/2015 1440   ALKPHOS 90 07/13/2023 0922   ALKPHOS 89 02/18/2015 1440   AST 15 07/13/2023 0922   AST 20 02/18/2015 1440   ALT 11 07/13/2023 0922   ALT 16 02/18/2015 1440   BILITOT 0.4 07/13/2023 0922   BILITOT 0.37  02/18/2015 1440       RADIOGRAPHIC STUDIES: CT ANGIO HEAD NECK W WO CM Result Date: 07/14/2023 CLINICAL DATA:  Vertigo EXAM: CT ANGIOGRAPHY HEAD AND NECK WITH AND WITHOUT  CONTRAST TECHNIQUE: Multidetector CT imaging of the head and neck was performed using the standard protocol during bolus administration of intravenous contrast. Multiplanar CT image reconstructions and MIPs were obtained to evaluate the vascular anatomy. Carotid stenosis measurements (when applicable) are obtained utilizing NASCET criteria, using the distal internal carotid diameter as the denominator. RADIATION DOSE REDUCTION: This exam was performed according to the departmental dose-optimization program which includes automated exposure control, adjustment of the mA and/or kV according to patient size and/or use of iterative reconstruction technique. CONTRAST:  75mL OMNIPAQUE  IOHEXOL  350 MG/ML SOLN COMPARISON:  None Available. FINDINGS: CT HEAD FINDINGS Brain: No mass,hemorrhage or extra-axial collection. Normal appearance of the parenchyma and CSF spaces. Vascular: No hyperdense vessel or unexpected vascular calcification. Skull: The visualized skull base, calvarium and extracranial soft tissues are normal. Sinuses/Orbits: No fluid levels or advanced mucosal thickening of the visualized paranasal sinuses. No mastoid or middle ear effusion. Normal orbits. CTA NECK FINDINGS Skeleton: No acute abnormality or high grade bony spinal canal stenosis. Other neck: Normal pharynx, larynx and major salivary glands. No cervical lymphadenopathy. Unremarkable thyroid  gland. Upper chest: No pneumothorax or pleural effusion. No nodules or masses. Aortic arch: There is no calcific atherosclerosis of the aortic arch. Conventional 3 vessel aortic branching pattern. RIGHT carotid system: No dissection, occlusion or aneurysm. Mild atherosclerotic calcification at the carotid bifurcation without hemodynamically significant stenosis. LEFT carotid system: No  dissection, occlusion or aneurysm. Mild atherosclerotic calcification at the carotid bifurcation without hemodynamically significant stenosis. Vertebral arteries: Right dominant configuration. There is no dissection, occlusion or flow-limiting stenosis to the skull base (V1-V3 segments). CTA HEAD FINDINGS POSTERIOR CIRCULATION: Vertebral arteries are normal. No proximal occlusion of the anterior or inferior cerebellar arteries. Basilar artery is normal. Superior cerebellar arteries are normal. Posterior cerebral arteries are normal. ANTERIOR CIRCULATION: Intracranial internal carotid arteries are normal. Anterior cerebral arteries are normal. Middle cerebral arteries are normal. Venous sinuses: As permitted by contrast timing, patent. Anatomic variants: None Review of the MIP images confirms the above findings. IMPRESSION: 1. No emergent large vessel occlusion or hemodynamically significant stenosis of the head or neck. 2. Mild bilateral carotid bifurcation atherosclerosis without hemodynamically significant stenosis. Electronically Signed   By: Juanetta Nordmann M.D.   On: 07/14/2023 21:55   CT CHEST ABDOMEN PELVIS WO CONTRAST Result Date: 07/13/2023 CLINICAL DATA:  History of renal cell carcinoma, follow-up pulmonary nodule seen on prior examination. * Tracking Code: BO * EXAM: CT CHEST, ABDOMEN AND PELVIS WITHOUT CONTRAST TECHNIQUE: Multidetector CT imaging of the chest, abdomen and pelvis was performed following the standard protocol without IV contrast. RADIATION DOSE REDUCTION: This exam was performed according to the departmental dose-optimization program which includes automated exposure control, adjustment of the mA and/or kV according to patient size and/or use of iterative reconstruction technique. COMPARISON:  Multiple priors including most recent CT January 06, 2023 FINDINGS: CT CHEST FINDINGS Cardiovascular: Aortic atherosclerosis. Calcifications of the mitral annulus. Coronary artery calcifications.  Normal size heart. Trace pericardial effusion similar to prior. Mediastinum/Nodes: No suspicious thyroid  nodule. No pathologically enlarged mediastinal, hilar or axillary lymph nodes. Lungs/Pleura: Subtle 3 mm pulmonary nodule in the left lung apex and 2 mm right middle lobe pulmonary nodule which were new on prior examination are not seen on today's examination. Additional scattered pulmonary nodules are stable for instance a 3 mm nodule in the anterior left upper lobe on image 58/6 and a 3 mm pulmonary nodule in the left lower lobe on image 73/6. No new suspicious pulmonary nodules or masses.  Scattered atelectasis/scarring. Musculoskeletal: No aggressive lytic or blastic lesion of bone. Stable subtle lucency in the T11 vertebral body, favored benign. Multilevel degenerative changes spine. Degenerative changes bilateral shoulders. CT ABDOMEN PELVIS FINDINGS Hepatobiliary: No suspicious hepatic lesion on noncontrast enhanced examination. Cholelithiasis. No biliary ductal dilation. Pancreas: No pancreatic ductal dilation or evidence of acute inflammation. Spleen: No splenomegaly. Adrenals/Urinary Tract: Bilateral adrenal glands appear normal. Right kidney is surgically absent without new suspicious soft tissue nodularity in the nephrectomy bed. Stable fluid density left renal cysts. No left-sided hydronephrosis. Stomach/Bowel: Stomach is unremarkable for degree of distension. Duodenal diverticula. Sigmoid colonic diverticulosis. No evidence of acute bowel inflammation. Vascular/Lymphatic: Normal caliber abdominal aorta. Smooth IVC contours. No pathologically enlarged abdominal or pelvic lymph nodes. Reproductive: Calcified uterine leiomyomas. Other: Similar soft tissue nodularity in the anterior abdominal wall favored postsurgical change. Musculoskeletal: No aggressive lytic or blastic lesion of bone. Stable sclerotic focus in the sacrum likely a bone island. IMPRESSION: 1. Subtle 3 mm pulmonary nodule in the left  lung apex and 2 mm right middle lobe pulmonary nodule which were new on prior examination are not seen on today's examination, favored infectious/inflammatory. Additional scattered pulmonary nodules are stable. No new suspicious pulmonary nodules or masses. 2. No evidence of new or progressive disease in the chest, abdomen or pelvis. 3. Cholelithiasis. Electronically Signed   By: Tama Fails M.D.   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 She is status post right radical nephrectomy completed by Dr. Valeta Gaudier on Jul 31, 2019. Final pathology showed a 6 cm clear-cell renal cell carcinoma.  The patient is currently on observation and she is feeling fine. She had repeat CT scan of the chest, abdomen and pelvis performed recently.  I personally and independently reviewed the scan and discussed the result with the patient today.    Stage 3 clear cell renal cell carcinoma Stage 3 clear cell renal cell carcinoma, post right radical nephrectomy in May 2021. Recent CT scan of the chest, abdomen, and pelvis shows no new lesions and disappearance of previously noted lung nodule, indicating well-managed disease. 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 Previous treatments, including TMS, have been ineffective. Genetic study indicates medication resistance. She is under psychiatric care and considering ketamine nasal spray. - Continue psychiatric care - Consider ketamine nasal spray as per psychiatrist's recommendation   The patient was advised to call immediately if she has any concerning symptoms in the interval.  The patient voices understanding of current disease status and treatment options and  is in agreement with the current care plan.  All questions were answered. The patient knows to call the clinic with any problems, questions or concerns. We can certainly see the patient much sooner if necessary.  The total time spent in the appointment was 20 minutes.  Disclaimer: This note was dictated with voice recognition software. Similar sounding words can inadvertently be transcribed and may not be corrected upon review.

## 2023-07-21 ENCOUNTER — Other Ambulatory Visit: Payer: Self-pay

## 2023-07-21 ENCOUNTER — Emergency Department (HOSPITAL_BASED_OUTPATIENT_CLINIC_OR_DEPARTMENT_OTHER): Admitting: Radiology

## 2023-07-21 ENCOUNTER — Encounter (HOSPITAL_BASED_OUTPATIENT_CLINIC_OR_DEPARTMENT_OTHER): Payer: Self-pay

## 2023-07-21 ENCOUNTER — Emergency Department (HOSPITAL_BASED_OUTPATIENT_CLINIC_OR_DEPARTMENT_OTHER)
Admission: EM | Admit: 2023-07-21 | Discharge: 2023-07-22 | Disposition: A | Discharge status: Home / Self Care | Attending: Emergency Medicine | Admitting: Emergency Medicine

## 2023-07-21 ENCOUNTER — Telehealth: Payer: Self-pay | Admitting: Cardiology

## 2023-07-21 DIAGNOSIS — F419 Anxiety disorder, unspecified: Secondary | ICD-10-CM | POA: Insufficient documentation

## 2023-07-21 DIAGNOSIS — I129 Hypertensive chronic kidney disease with stage 1 through stage 4 chronic kidney disease, or unspecified chronic kidney disease: Secondary | ICD-10-CM | POA: Diagnosis not present

## 2023-07-21 DIAGNOSIS — F4310 Post-traumatic stress disorder, unspecified: Secondary | ICD-10-CM | POA: Diagnosis not present

## 2023-07-21 DIAGNOSIS — R002 Palpitations: Secondary | ICD-10-CM | POA: Diagnosis not present

## 2023-07-21 DIAGNOSIS — I2510 Atherosclerotic heart disease of native coronary artery without angina pectoris: Secondary | ICD-10-CM | POA: Diagnosis not present

## 2023-07-21 DIAGNOSIS — M797 Fibromyalgia: Secondary | ICD-10-CM | POA: Diagnosis not present

## 2023-07-21 DIAGNOSIS — G4733 Obstructive sleep apnea (adult) (pediatric): Secondary | ICD-10-CM | POA: Diagnosis not present

## 2023-07-21 DIAGNOSIS — F332 Major depressive disorder, recurrent severe without psychotic features: Secondary | ICD-10-CM | POA: Insufficient documentation

## 2023-07-21 DIAGNOSIS — E039 Hypothyroidism, unspecified: Secondary | ICD-10-CM | POA: Diagnosis not present

## 2023-07-21 DIAGNOSIS — Z79899 Other long term (current) drug therapy: Secondary | ICD-10-CM | POA: Diagnosis not present

## 2023-07-21 DIAGNOSIS — R0600 Dyspnea, unspecified: Secondary | ICD-10-CM | POA: Diagnosis not present

## 2023-07-21 DIAGNOSIS — Z85528 Personal history of other malignant neoplasm of kidney: Secondary | ICD-10-CM | POA: Diagnosis not present

## 2023-07-21 DIAGNOSIS — N189 Chronic kidney disease, unspecified: Secondary | ICD-10-CM | POA: Diagnosis not present

## 2023-07-21 DIAGNOSIS — F32A Depression, unspecified: Secondary | ICD-10-CM | POA: Insufficient documentation

## 2023-07-21 DIAGNOSIS — I4719 Other supraventricular tachycardia: Secondary | ICD-10-CM | POA: Insufficient documentation

## 2023-07-21 LAB — TSH: TSH: 2.98 u[IU]/mL (ref 0.350–4.500)

## 2023-07-21 LAB — CBC WITH DIFFERENTIAL/PLATELET
Abs Immature Granulocytes: 0.03 10*3/uL (ref 0.00–0.07)
Basophils Absolute: 0 10*3/uL (ref 0.0–0.1)
Basophils Relative: 0 %
Eosinophils Absolute: 0.2 10*3/uL (ref 0.0–0.5)
Eosinophils Relative: 4 %
HCT: 37 % (ref 36.0–46.0)
Hemoglobin: 12.2 g/dL (ref 12.0–15.0)
Immature Granulocytes: 1 %
Lymphocytes Relative: 32 %
Lymphs Abs: 1.7 10*3/uL (ref 0.7–4.0)
MCH: 27.4 pg (ref 26.0–34.0)
MCHC: 33 g/dL (ref 30.0–36.0)
MCV: 83.1 fL (ref 80.0–100.0)
Monocytes Absolute: 0.4 10*3/uL (ref 0.1–1.0)
Monocytes Relative: 7 %
Neutro Abs: 2.9 10*3/uL (ref 1.7–7.7)
Neutrophils Relative %: 56 %
Platelets: 235 10*3/uL (ref 150–400)
RBC: 4.45 MIL/uL (ref 3.87–5.11)
RDW: 14 % (ref 11.5–15.5)
WBC: 5.3 10*3/uL (ref 4.0–10.5)
nRBC: 0 % (ref 0.0–0.2)

## 2023-07-21 LAB — TROPONIN T, HIGH SENSITIVITY
Troponin T High Sensitivity: <15 ng/L (ref <19)
Troponin T High Sensitivity: <15 ng/L (ref <19)

## 2023-07-21 LAB — COMPREHENSIVE METABOLIC PANEL WITH GFR
ALT: 14 U/L (ref 0–44)
AST: 29 U/L (ref 15–41)
Albumin: 4 g/dL (ref 3.5–5.0)
Alkaline Phosphatase: 81 U/L (ref 38–126)
Anion gap: 12 (ref 5–15)
BUN: 20 mg/dL (ref 8–23)
CO2: 23 mmol/L (ref 22–32)
Calcium: 10.1 mg/dL (ref 8.9–10.3)
Chloride: 102 mmol/L (ref 98–111)
Creatinine, Ser: 1.15 mg/dL — ABNORMAL HIGH (ref 0.44–1.00)
GFR, Estimated: 49 mL/min — ABNORMAL LOW (ref >60)
Glucose, Bld: 108 mg/dL — ABNORMAL HIGH (ref 70–99)
Potassium: 4.7 mmol/L (ref 3.5–5.1)
Sodium: 137 mmol/L (ref 135–145)
Total Bilirubin: 0.5 mg/dL (ref 0.0–1.2)
Total Protein: 6.6 g/dL (ref 6.5–8.1)

## 2023-07-21 LAB — PRO BRAIN NATRIURETIC PEPTIDE: Pro Brain Natriuretic Peptide: 121 pg/mL (ref <300.0)

## 2023-07-21 LAB — T4, FREE: Free T4: 1.03 ng/dL (ref 0.61–1.12)

## 2023-07-21 LAB — MAGNESIUM: Magnesium: 2.2 mg/dL (ref 1.7–2.4)

## 2023-07-21 MED ORDER — MIRTAZAPINE 15 MG PO TBDP
7.5000 mg | ORAL_TABLET | Freq: Every day | ORAL | Status: DC
  Filled 2023-07-21: qty 0.5

## 2023-07-21 MED ORDER — HYDROXYZINE HCL 25 MG PO TABS: 25.0000 mg | ORAL_TABLET | Freq: Three times a day (TID) | ORAL | Status: DC | PRN

## 2023-07-21 MED ORDER — LEVOTHYROXINE SODIUM 88 MCG PO TABS
88.0000 ug | ORAL_TABLET | Freq: Every day | ORAL | Status: DC
  Filled 2023-07-21: qty 1

## 2023-07-21 MED ORDER — ROPINIROLE HCL 0.5 MG PO TABS
0.5000 mg | ORAL_TABLET | Freq: Every day | ORAL | Status: DC
  Filled 2023-07-21: qty 2

## 2023-07-21 MED ORDER — METOPROLOL TARTRATE 25 MG PO TABS
25.0000 mg | ORAL_TABLET | Freq: Every day | ORAL | Status: DC
  Administered 2023-07-21: 25 mg via ORAL
  Filled 2023-07-21: qty 1

## 2023-07-21 MED ORDER — TIZANIDINE HCL 2 MG PO TABS
2.0000 mg | ORAL_TABLET | Freq: Every day | ORAL | Status: DC
  Administered 2023-07-21: 2 mg via ORAL
  Filled 2023-07-21: qty 1

## 2023-07-21 NOTE — ED Notes (Signed)
 TTS at bedside.

## 2023-07-21 NOTE — ED Notes (Signed)
 Pt sitting in chair, states helps with "restless leg"

## 2023-07-21 NOTE — ED Notes (Signed)
 Pt given pudding and chips with water  as per pt request

## 2023-07-21 NOTE — ED Notes (Signed)
 Patient has been seen by TTS,

## 2023-07-21 NOTE — BH Assessment (Signed)
 Patient was deferred to [facility] for a telepsych assessment. The assigned care coordinator will provide updates regarding the scheduling of the assessment. [facility] coordinator can be reached at [phone] for further information on the timing of the telepsych evaluation.

## 2023-07-21 NOTE — Consult Note (Addendum)
 [facility] Consult Note  Patient Name: [name] MRN: [id number] DOB: 11-16-1945 DATE OF Consult: 07/21/2023  PRIMARY PSYCHIATRIC DIAGNOSES  - Major depressive disorder, recurrent, severe, without psychotic features  - Generalized anxiety disorder  RECOMMENDATIONS  Admit to inpatient psych for safety and   Medication recommendations: start Mirtazapine  7.5mg  PO QHS daily for depression. Continue Hydroxyzine  25mg  TID PRN for anxiety.   Non-Medication/therapeutic recommendations: supportive care  Communication: Treatment team members (and family members if applicable) who were involved in treatment/care discussions and planning, and with whom we spoke or engaged with via secure text/chat, include the following:  patient's treatment team.  Thank you for involving us  in the care of this patient. If you have any additional questions or concerns, please call [phone] and ask for me or the provider on-call.  TELEPSYCHIATRY ATTESTATION & CONSENT  As the provider for this telehealth consult, I attest that I verified the patient's identity using two separate identifiers, introduced myself to the patient, provided my credentials, disclosed my location, and performed this encounter via a HIPAA-compliant, real-time, face-to-face, two-way, interactive audio and video platform and with the full consent and agreement of the patient (or guardian as applicable.)  Patient physical location: [employer]. Telehealth provider physical location: home office in state of [place].  Video start time: 2202 [facility] Time) Video end time: 2217 (Central Time)  IDENTIFYING DATA  [name] is a 78 y.o. year-old female for whom a psychiatric consultation has been ordered by the primary provider. The patient was identified using two separate identifiers.  CHIEF COMPLAINT/REASON FOR CONSULT  - "I've been having these anxiety episodes. 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 She initially sought evaluation due to concerns that these episodes might be cardiac in nature, but subsequent evaluation ruled out cardiac causes, and the episodes were attributed to anxiety. She has a longstanding history of treatment-resistant depression, which she reports has been present since her early twenties. She describes this past winter as particularly severe, with significant depressive symptoms including staying in bed for prolonged periods, neglecting self-care, and social withdrawal. She also reports interrupted sleep and a lack of functionality in her daily life.  The patient has a history of trials with numerous antidepressants and mood stabilizers, including Prozac , Zoloft, Wellbutrin , Lexapro , Effexor, Cymbalta , Pristiq , Abilify , Risperidone, Zyprexa , Seroquel , and Vraylar, among others. She reports that none of these medications have been effective, and some have caused adverse effects, such as severe restlessness and irritability with Vraylar. She has undergone genetic testing, which indicated treatment resistance to medications. She has also tried transcranial magnetic stimulation (TMS) with no benefit. She has not undergone electroconvulsive therapy (ECT) but has expressed interest in trying Spravato (esketamine) nasal spray as a potential treatment option.  The patient has a family history suggestive of mental health conditions, including possible bipolar disorder in her father and uncle, and depression in her mother and grandmother. She has been hospitalized multiple times for major depression, with the  most recent hospitalization occurring two years ago. She has not attempted suicide but has experienced intermittent suicidal  ideation in the past, though she denies any current suicidal thoughts.  Currently, the patient is on medications including losartan  and metoprolol  for blood pressure, a cholesterol-lowering medication, ropinirole  for restless leg syndrome, and Ritalin  for attention and focus. She also uses Ativan  as needed for anxiety but reports no issues with overuse. She has had adverse reactions to long-acting methylphenidate  and prefers the short-acting formulation.  The patient reports significant distress related to her current mental health state and expresses a desire for inpatient psychiatric care to address her symptoms and explore treatment options. She also mentions a recent incident where her psychiatrist discontinued care due to a misunderstanding, which has added to her stress. She is open to trying new medications, including mirtazapine  (Remeron ), which she has not previously used, and is seeking behavioral and therapeutic support to improve her quality of life.  PAST PSYCHIATRIC HISTORY  - Diagnosed with medication-resistant depression since age 18. - History of multiple psychiatric hospitalizations, last hospitalization two years ago for major depression. - Previous treatments include TMS (16 sessions, no improvement). - Medications tried include: Prozac , Zoloft, Wellbutrin , Lexapro , Effexor, Cymbalta , Pristiq , Abilify , Risperidone, Zyprexa , Seroquel , Vraylar, Celexa , Viibryd, Paxil . - Reported adverse reactions to Vraylar (severe restlessness and irritability) and Seroquel  (sickness). 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 - No history of suicide attempts, though has experienced intermittent thoughts of self-harm.  PAST MEDICAL HISTORY  Past Medical History:  Diagnosis Date   Abscess of Bartholin's gland    Acquired hypothyroidism 1990   after  partial thyroidectomy for thyroid  adenoma   Acute vestibular neuronitis    ADHD (attention deficit hyperactivity disorder)    Adverse effect of general anesthetic    felt paralyzed while receiving anesthesia   Arthritis    bilateral shoulders, knees and hips   Bursitis of right shoulder 05/24/2019   Right shoulder subacromial injection   CAD (coronary artery disease)    cath 05/2016 showing 50-70% stenosis in the mid LAD proximal to the first diagonal and 70-80% small OM1. FFR of LAD  not performed because of difficulty with catheter control from the right radial.    Chronic cough 02/22/2020   Chronic kidney disease    hx of kidney cancer   Diastolic dysfunction, left ventricle 05/31/2013   Difficult intubation    told by [facility] that she was hard to intubate 15 yrs ago in [place]- surgery since then no problems   Dyspnea    Essential hypertension 05/31/2013   Fibromyalgia    Generalized anxiety disorder    Adequate for discharge    GERD (gastroesophageal reflux disease)    History of attention deficit disorder    History of echocardiogram 07/2012   Normal LVF w grade I siastolic dysfunction    History of endometriosis    History of gall stones 12/31/2009   History of pleural effusion    Hyperlipidemia 06/24/2016   LDL goal <70   Hypertension    Hypothyroidism 1990   after partial thyroidectomy for thyroid  adenoma   Major depressive disorder    Obesity    Obstructive sleep apnea 02/20/2007   NPSG 2004:  AHI 10/hr Failed cpap trials.>dental appliance   Split night 06/2015 >Moderate obstructive sleep apnea occurred during this study  (AHI = 22.7/h).>rec CPAP >> poor compliance  HST 11/2016 AHI 12/h, 7h TST  03/2018 -office visit with Dr. Villa Greaser discussed inspire device, patient would need to lose weight   PAT (paroxysmal atrial tachycardia) (HCC)    s/p ablation   PONV (postoperative  nausea and vomiting)    Pre-diabetes    PTSD (post-traumatic stress disorder)    Pure hypercholesterolemia  02/18/2019   PVC's (premature ventricular contractions)    Renal mass 02/15/2012   Restless legs syndrome (RLS) 02/20/2007   Vertigo      HOME MEDICATIONS  Facility Ordered Medications  Medication   hydrOXYzine  (ATARAX ) tablet 25 mg   [START ON 07/22/2023] levothyroxine  (SYNTHROID ) tablet 88 mcg   metoprolol  tartrate (LOPRESSOR ) tablet 25 mg   rOPINIRole  (REQUIP ) tablet 0.5-1 mg   tiZANidine  (ZANAFLEX ) tablet 2 mg   PTA Medications  Medication Sig   rosuvastatin  (CRESTOR ) 5 MG tablet Take 1 tablet (5 mg total) by mouth daily. (Patient not taking: Reported on 07/14/2023)   MINERAL ICE 2 % GEL Apply 1 application  topically 2 (two) times daily as needed (for pain).   metoprolol  tartrate (LOPRESSOR ) 25 MG tablet Take 25 mg by mouth daily.   levothyroxine  (SYNTHROID ) 88 MCG tablet Take 88 mcg by mouth daily before breakfast.   promethazine  (PHENERGAN ) 25 MG tablet Take 25 mg by mouth as needed for nausea or vomiting.   losartan -hydrochlorothiazide  (HYZAAR) 50-12.5 MG tablet TAKE 1 TABLET BY MOUTH TWICE A DAY   UNABLE TO FIND Take 60 mg by mouth at bedtime. Med Name: Delta 8   B Complex Vitamins (B COMPLEX PO) Take 1 mL by mouth daily.   rOPINIRole  (REQUIP ) 0.5 MG tablet TAKE 1 TABLET BY MOUTH EVERY DAY AFTER SUPPER AND TAKE 2 TABLETS AT BEDTIME (Patient taking differently: Take 0.5-1 mg by mouth at bedtime.)   Omega-3 Fatty Acids (FISH OIL PO) Take 1 capsule by mouth daily.   hydrOXYzine  (ATARAX ) 25 MG tablet Take 1 tablet (25 mg total) by mouth 3 (three) times daily as needed. (Patient taking differently: Take 25 mg by mouth 3 (three) times daily as needed for anxiety.)   methylphenidate  (RITALIN ) 10 MG tablet Take 10 mg by mouth See admin instructions. Take 10 mg by mouth in the morning and at 2 PM- in conjunction with one 5 mg tablet to equal a total dose of 15 mg   methylphenidate  (RITALIN ) 5 MG tablet Take 5 mg by mouth See admin instructions. Take 5 mg by mouth in the morning and at 2 PM-  in conjunction with one 10 mg tablet to equal a total dose of 15 mg   tiZANidine  (ZANAFLEX ) 2 MG tablet Take 2 mg by mouth at bedtime.   traMADol  (ULTRAM ) 50 MG tablet Take 50 mg by mouth 2 (two) times daily as needed (for pain).   Lidocaine  HCl (ASPERCREME LIDOCAINE ) 4 % CREA Apply 1 application  topically 2 (two) times daily as needed (for pain).   TYLENOL  8 HOUR ARTHRITIS PAIN 650 MG CR tablet Take 650 mg by mouth every 8 (eight) hours as needed for pain.   MAGNESIUM  PO Take 500 mg by mouth in the morning.   fluticasone (FLONASE) 50 MCG/ACT nasal spray Place 1 spray into both nostrils in the morning.     ALLERGIES  Allergies  Allergen Reactions   Geodon [Ziprasidone Hydrochloride] Other (See Comments)    Extremely agitated   Lithium Nausea Only and Other (See Comments)    Off balance, increased heart rate   Talwin [Pentazocine] Other (See Comments)    Hallucinations    Toradol [Ketorolac Tromethamine] Other (See Comments)    Chest pains   Abilify  [Aripiprazole ] Other (See Comments)    jerking   Compazine [Prochlorperazine Edisylate] Nausea And Vomiting   Latuda  [  Lurasidone  Hcl] Other (See Comments)    Reports made her mind race more and made her irritable    Pristiq  [Desvenlafaxine  Succinate Er] Other (See Comments)    Did not work, prefers not to take   Rexulti  [Brexpiprazole ] Other (See Comments) and Hypertension    Elevated BP, created aggression   Diflucan [Fluconazole] Nausea And Vomiting    SOCIAL & SUBSTANCE USE HISTORY  Social History   Socioeconomic History   Marital status: Single    Spouse name: Not on file   Number of children: 1   Years of education: 14   Highest education level: Some college, no degree  Occupational History   Occupation: Retired  Tobacco Use   Smoking status: Never   Smokeless tobacco: Never  Vaping Use   Vaping status: Never Used  Substance and Sexual Activity   Alcohol  use: Not Currently    Alcohol /week: 1.0 standard drink of  alcohol     Types: 1 Glasses of wine per week    Comment: occas   Drug use: No    Comment: Delta 8   Sexual activity: Yes    Partners: Male    Birth control/protection: None  Other Topics Concern   Not on file  Social History Narrative   Divorced and lives alone   Has children   Caffeine use: none   Right handed    Social Drivers of Health   Financial Resource Strain: Low Risk  (11/26/2021)   Overall Financial Resource Strain (CARDIA)    Difficulty of Paying Living Expenses: Not hard at all  Food Insecurity: No Food Insecurity (07/01/2022)   Hunger Vital Sign    Worried About Running Out of Food in the Last Year: Never true    Ran Out of Food in the Last Year: Never true  Transportation Needs: No Transportation Needs (07/01/2022)   PRAPARE - Administrator, Civil Service (Medical): No    Lack of Transportation (Non-Medical): No  Physical Activity: Inactive (11/26/2021)   Exercise Vital Sign    Days of Exercise per Week: 0 days    Minutes of Exercise per Session: 0 min  Stress: Stress Concern Present (01/13/2022)   [employer] of Occupational Health - Occupational Stress Questionnaire    Feeling of Stress : Very much  Social Connections: Unknown (07/20/2021)   Received from [facility]   Social Network    Social Network: Not on file   Social History   Tobacco Use  Smoking Status Never  Smokeless Tobacco Never   Social History   Substance and Sexual Activity  Alcohol  Use Not Currently   Alcohol /week: 1.0 standard drink of alcohol    Types: 1 Glasses of wine per week   Comment: occas   Social History   Substance and Sexual Activity  Drug Use No   Comment: Delta 8    Additional pertinent information .  FAMILY HISTORY  Family History  Problem Relation Age of Onset   Allergies Father    Skin cancer Father    Alcohol  abuse Father    Mental illness Father    Heart disease Mother    Depression Mother    Hypertension Mother    CAD  Mother    Colon cancer Paternal Grandmother    OCD Paternal Grandmother    Multiple sclerosis Sister    Autoimmune disease Brother        unknown type   Allergies Daughter    Heart disease Maternal Grandfather  Cancer Paternal Grandfather    Parkinson's disease Paternal Grandfather    Mental illness Maternal Grandmother    Family Psychiatric History (if known): includes suspected bipolar disorder in father and uncle, and depression in mother and grandmother.  MENTAL STATUS EXAM (MSE)  Mental Status Exam: General Appearance:  wearing hospital gown  Orientation:  Full (Time, Place, and Person)  Memory:  good  Concentration:  Attention Span: Fair  Recall:  Good  Attention  Fair  Eye Contact:  Good  Speech:  Clear and Coherent  Language:  Good  Volume:  Normal  Mood: described her mood as "blackest" and "depressed," indicating a severe depressive state.  Affect:  Congruent  Thought Process:  Linear  Thought Content:  The patient expressed feelings of hopelessness and helplessness, with a focus on her inability to function and maintain her living situation.   Suicidal Thoughts:  No  Homicidal Thoughts:  No  Judgement:  Fair  Insight:  Fair  Psychomotor Activity:  Restlessness  Akathisia:  No  Fund of Knowledge:  Good    Assets:  Communication Skills Desire for Improvement Housing  Cognition:  WNL  ADL's:  fair  AIMS (if indicated):       VITALS  Blood pressure 126/74, pulse 83, temperature 98.4 F (36.9 C), temperature source Oral, resp. rate 16, height 5\' 3"  (1.6 m), weight 102.3 kg, SpO2 100%.  LABS  Admission on 07/21/2023  Component Date Value Ref Range Status   Sodium 07/21/2023 137  135 - 145 mmol/L Final   Potassium 07/21/2023 4.7  3.5 - 5.1 mmol/L Final   Chloride 07/21/2023 102  98 - 111 mmol/L Final   CO2 07/21/2023 23  22 - 32 mmol/L Final   Glucose, Bld 07/21/2023 108 (H)  70 - 99 mg/dL Final   Glucose reference range applies only to samples taken after  fasting for at least 8 hours.   BUN 07/21/2023 20  8 - 23 mg/dL Final   Creatinine, Ser 07/21/2023 1.15 (H)  0.44 - 1.00 mg/dL Final   Calcium  07/21/2023 10.1  8.9 - 10.3 mg/dL Final   Total Protein 16/12/9602 6.6  6.5 - 8.1 g/dL Final   Albumin  07/21/2023 4.0  3.5 - 5.0 g/dL Final   AST 54/11/8117 29  15 - 41 U/L Final   ALT 07/21/2023 14  0 - 44 U/L Final   Alkaline Phosphatase 07/21/2023 81  38 - 126 U/L Final   Total Bilirubin 07/21/2023 0.5  0.0 - 1.2 mg/dL Final   GFR, Estimated 07/21/2023 49 (L)  >60 mL/min Final   Comment: (NOTE) Calculated using the CKD-EPI Creatinine Equation (2021)    Anion gap 07/21/2023 12  5 - 15 Final   Performed at [facility], [address] [place], [place] [address]   Magnesium  07/21/2023 2.2  1.7 - 2.4 mg/dL Final   Performed at [facility], [address] [place], [place] [address]   Pro Brain Natriuretic Peptide 07/21/2023 121.0  <300.0 pg/mL Final   Comment: (NOTE) Age Group        Cut-Points    Interpretation  < 50 years     450 pg/mL       NT-proBNP > 450 pg/mL indicates                                ADHF is likely  50 to 75 years  900 pg/mL      NT-proBNP > 900 pg/mL indicates          ADHF is likely  > 75 years      1800 pg/mL     NT-proBNP > 1800 pg/mL indicates          ADHF is likely                           All ages    Results between       Indeterminate. Further clinical             300 and the cut-   information is needed to determine            point for age group   if ADHF is present.                                                             Elecsys proBNP II/ Elecsys proBNP II STAT           Cut-Point                       Interpretation  300 pg/mL                    NT-proBNP <300pg/mL indicates                             ADHF is not likely  Performed at [facility], [address], [place], [place] [address]    Troponin T High  Sensitivity 07/21/2023 <15  <19 ng/L Final   Comment: (NOTE) Biotin concentrations > 1000 ng/mL falsely decrease TnT results.  Serial cardiac troponin measurements are suggested.  Refer to the Links section for chest pain algorithms and additional  guidance. Performed at [facility], [address], [place], [place] [address]    TSH 07/21/2023 2.980  0.350 - 4.500 uIU/mL Final   Performed at [facility], [address] [place], [place] [address]   WBC 07/21/2023 5.3  4.0 - 10.5 K/uL Final   RBC 07/21/2023 4.45  3.87 - 5.11 MIL/uL Final   Hemoglobin 07/21/2023 12.2  12.0 - 15.0 g/dL Final   HCT 91/47/8295 37.0  36.0 - 46.0 % Final   MCV 07/21/2023 83.1  80.0 - 100.0 fL Final   MCH 07/21/2023 27.4  26.0 - 34.0 pg Final   MCHC 07/21/2023 33.0  30.0 - 36.0 g/dL Final   RDW 62/13/0865 14.0  11.5 - 15.5 % Final   Platelets 07/21/2023 235  150 - 400 K/uL Final   Comment: SPECIMEN CHECKED FOR CLOTS REPEATED TO VERIFY    nRBC 07/21/2023 0.0  0.0 - 0.2 % Final   Neutrophils Relative % 07/21/2023 56  % Final   Neutro Abs 07/21/2023 2.9  1.7 - 7.7 K/uL Final   Lymphocytes Relative 07/21/2023 32  % Final   Lymphs Abs 07/21/2023 1.7  0.7 - 4.0 K/uL Final   Monocytes Relative 07/21/2023 7  % Final   Monocytes Absolute 07/21/2023 0.4  0.1 - 1.0 K/uL Final   Eosinophils Relative 07/21/2023 4  % Final  Eosinophils Absolute 07/21/2023 0.2  0.0 - 0.5 K/uL Final   Basophils Relative 07/21/2023 0  % Final   Basophils Absolute 07/21/2023 0.0  0.0 - 0.1 K/uL Final   Immature Granulocytes 07/21/2023 1  % Final   Abs Immature Granulocytes 07/21/2023 0.03  0.00 - 0.07 K/uL Final   Performed at [facility], [address] [place], [place] [address]   Troponin T High Sensitivity 07/21/2023 <15  <19 ng/L Final   Comment: (NOTE) Biotin concentrations > 1000 ng/mL falsely decrease TnT results.  Serial cardiac troponin measurements are  suggested.  Refer to the Links section for chest pain algorithms and additional  guidance. Performed at [facility], [address] [place], [place] [address]     PSYCHIATRIC REVIEW OF SYSTEMS (ROS)  Assessment  The patient presents with a history of treatment-resistant major depressive disorder, recurrent and severe, which has been ongoing since her early adulthood. She reports a worsening of symptoms during the winter months, consistent with seasonal affective patterns, and diurnal variations with interrupted sleep. Her current medications include antihypertensives, a cholesterol-lowering agent, and Ritalin  for attention and focus, as well as Ativan  for anxiety management. She has undergone genetic testing indicating medication resistance and has tried multiple antidepressants and mood stabilizers without success. She has not undergone electroconvulsive therapy (ECT) but has completed transcranial magnetic stimulation (TMS) treatments without improvement. Family history reveals potential bipolar disorder in her father and uncle, and depression in her mother and grandmother.  Psychologically, the patient describes a pervasive sense of hopelessness and inability to function, including neglect of self-care and social isolation. She has experienced anxiety episodes recently, leading to emergency department visits. She has expressed interest in exploring trauma therapy and has been recommended Spravato nasal spray for depression. She has not had suicidal ideation recently but has experienced it intermittently in the past. Her coping mechanisms appear limited, and she has expressed a need for behavioral support.  Socially, the patient lives alone and reports difficulty maintaining her living environment and social connections. She has experienced a recent loss of psychiatric care due to a misunderstanding with her psychiatrist, which has added to her distress. She has a history of  multiple psychiatric hospitalizations, the last being two years ago. She is open to admission for psychiatric care and medication management to address her current state and improve her quality of life. PDMP review shows Lorazepam  1mg  filled on 07/20/23 QTY 5 for 5 days, Methylphenidate  ER 36mg  filled on 06/30/23 30 day supply  Additional findings:      Musculoskeletal: No abnormal movements observed      Gait & Station: Laying/Sitting      Pain Screening: Denies      Nutrition & Dental Concerns: Decrease in food intake and/or loss of appetite  RISK FORMULATION/ASSESSMENT  Is the patient experiencing any suicidal or homicidal ideations: No       Explain if yes:  Protective factors considered for safety management: include her expressed willingness to seek treatment and her acknowledgment of needing help, which demonstrates motivation to improve her condition.  Risk factors/concerns considered for safety management:  The patient has several risk factors that increase her risk of suicide, including chronic mental health issues such as treatment-resistant depression and anxiety, a history of multiple psychiatric hospitalizations, and a family history of mental health disorders. She also reported significant social isolation and difficulty maintaining her living environment, which are additional stressors.  Modifiable risk factors include her current lack of effective mental health  treatment, her isolated living situation, and her inability to engage in social activities. Addressing these factors through hospitalization, medication adjustments, and therapy could reduce her risk.  Is there a safety management plan with the patient and treatment team to minimize risk factors and promote protective factors: Yes           Explain: admit to psych Is crisis care placement or psychiatric hospitalization recommended: Yes     Based on my current evaluation and risk assessment, patient is determined at this  time to be at:  Moderate Risk  *RISK ASSESSMENT Risk assessment is a dynamic process; it is possible that this patient's condition, and risk level, may change. This should be re-evaluated and managed over time as appropriate. Please re-consult psychiatric consult services if additional assistance is needed in terms of risk assessment and management. If your team decides to discharge this patient, please advise the patient how to best access emergency psychiatric services, or to call 911, if their condition worsens or they feel unsafe in any way.   Threasa Flood, NP Telepsychiatry Consult Services

## 2023-07-21 NOTE — Telephone Encounter (Signed)
 Patient transferred as stat call from call center,   Patient is crying on the phone. Patient states she is having an episode right now. She states she also had an episode twice before, yesterday she called the ambulance- yesterday EKG was abnormal, stated anterior infarct. EMS told patient to go to ED for further evaluation- but patient decline. Hot, shaking, headache, short of breath.   Patient also notes losing her physiatrist 2 days ago.   Advised patient to present to the ED for prompt evaluation, verified location of [facility] ED.

## 2023-07-21 NOTE — ED Triage Notes (Signed)
 Patient arrives ambulatory to the ED with complaints of intermittent palpitations, dizziness, and headache x1 day. Patient states that the same symptoms occurred yesterday while she was in the store, and EMS had to be called.

## 2023-07-21 NOTE — ED Provider Notes (Addendum)
 [facility] EMERGENCY DEPARTMENT AT [facility] Provider Note   CSN: [id number] Arrival date & time: 07/21/23  1538     History  Chief Complaint  Patient presents with   Palpitations    [name] is a 78 y.o. female.  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   Had seen Dr. [name] Gottron in the past for palpitations, anxiety.  Did have the history of PAT in the past that improved after ablation and since then has had intermittent palpitations and anxiety for which she has had other monitoring done without showing any VT nor afib  Episode yesterday began around 3PM, improved around 5PM Today began around 2PM, took 1mg  ativan  at 220PM.  Now feel totally worn out, emotionally and physically. Not sure if this is anxiety, feels overwhelmed.    Lost psychiatrist 2 days ago, was discharged from practice for misunderstanding she had with receptionist (specifically she reports she was upset at receptionist, apologized, however receptionist accused her of making racial slur which she states she would not and did not do that and is upset that she was let go from the practice.)   Is having worsening depression, anxiety> Is tearful, overwhelemed.  Not having any energy to do anything. Moved and has not had any energy or incentive to make it home like.  Piles and messes and paperwork.  States she had a thought that if her cancer was back she would kill herself, gratefully she went to appt yesterday with Oncologist and repeat CT did not show any recurrence of cancer  Not sure if the classes, therapy would be helpful-was in [place] 2 years ago, Dr. Grier Leber was wonderful.  Is  interested in inpatient therapy at this time due to worsening symptoms and hopelessess.    Palpitations today felt better on arrival.  Had feeling of anxiety, shakiness, had dyspnea thought was anxiety and was able to walk without it getting worse.  Had chest pain too a tiny bit in the middle.  No nausea. No feeling exhausted, but not having dyspnea nor chest pain.   2 weeks ago in the ED, was reading and suddenly couldn't interpret what she was seeing 2 days in a row , couldn't remember neighbors name to call her.    Had CTA head and neck that was ok.  They had talked about getting MRI.  She left AMA and then when she followed up with her dr. Who did not feel she needed MRI. Gait off for months---had open MRI at that time was ok at that time and it was determined her gait was off because of knee pain.   Tizanidine  and tramadol  for legs, bilateral legs with aching, feet feel numb thinks could be neuropathy has been going on for several months. Using 4% lidocaine , knees down to ankles.    Past Medical History:  Diagnosis Date   Abscess of Bartholin's gland    Acquired hypothyroidism 1990   after partial thyroidectomy for thyroid  adenoma   Acute vestibular neuronitis    ADHD (attention deficit hyperactivity disorder)    Adverse effect of general anesthetic    felt paralyzed while receiving anesthesia   Arthritis    bilateral shoulders, knees and hips   Bursitis of right shoulder 05/24/2019  Right shoulder subacromial injection   CAD (coronary artery disease)    cath 05/2016 showing 50-70% stenosis in the mid LAD proximal to the first diagonal and 70-80% small OM1. FFR of LAD  not performed because of difficulty with catheter control from the right radial.    Chronic cough 02/22/2020   Chronic kidney disease    hx of kidney cancer   Diastolic dysfunction, left ventricle 05/31/2013   Difficult intubation    told by [facility] that she was hard to intubate 15 yrs ago in [place]- surgery since then no  problems   Dyspnea    Essential hypertension 05/31/2013   Fibromyalgia    Generalized anxiety disorder    Adequate for discharge    GERD (gastroesophageal reflux disease)    History of attention deficit disorder    History of echocardiogram 07/2012   Normal LVF w grade I siastolic dysfunction    History of endometriosis    History of gall stones 12/31/2009   History of pleural effusion    Hyperlipidemia 06/24/2016   LDL goal <70   Hypertension    Hypothyroidism 1990   after partial thyroidectomy for thyroid  adenoma   Major depressive disorder    Obesity    Obstructive sleep apnea 02/20/2007   NPSG 2004:  AHI 10/hr Failed cpap trials.>dental appliance   Split night 06/2015 >Moderate obstructive sleep apnea occurred during this study  (AHI = 22.7/h).>rec CPAP >> poor compliance  HST 11/2016 AHI 12/h, 7h TST  03/2018 -office visit with Dr. Villa Greaser discussed inspire device, patient would need to lose weight   PAT (paroxysmal atrial tachycardia) (HCC)    s/p ablation   PONV (postoperative nausea and vomiting)    Pre-diabetes    PTSD (post-traumatic stress disorder)    Pure hypercholesterolemia 02/18/2019   PVC's (premature ventricular contractions)    Renal mass 02/15/2012   Restless legs syndrome (RLS) 02/20/2007   Vertigo      Home Medications Prior to Admission medications   Medication Sig Start Date End Date Taking? Authorizing Provider  B Complex Vitamins (B COMPLEX PO) Take 1 mL by mouth daily.    [provider]  fluticasone (FLONASE) 50 MCG/ACT nasal spray Place 1 spray into both nostrils in the morning.    [provider]  hydrOXYzine  (ATARAX ) 25 MG tablet Take 1 tablet (25 mg total) by mouth 3 (three) times daily as needed. Patient taking differently: Take 25 mg by mouth 3 (three) times daily as needed for anxiety. 01/19/23   Byungura, Veronique M, NP  levothyroxine  (SYNTHROID ) 88 MCG tablet Take 88 mcg by mouth daily before breakfast. 03/14/21   [provider]  Lidocaine  HCl (ASPERCREME LIDOCAINE ) 4 % CREA Apply 1 application  topically 2 (two) times daily as needed (for pain).    [provider]  losartan -hydrochlorothiazide  (HYZAAR) 50-12.5 MG tablet TAKE 1 TABLET BY MOUTH TWICE A DAY 01/26/22   Sheryle Donning, MD  MAGNESIUM  PO Take 500 mg by mouth in the morning.    [provider]  methylphenidate  (RITALIN ) 10 MG tablet Take 10 mg by mouth See admin instructions. Take 10 mg by mouth in the morning and at 2 PM- in conjunction with one 5 mg tablet to equal a total dose of 15 mg    [provider]  methylphenidate  (RITALIN ) 5 MG tablet Take 5 mg by mouth See admin instructions. Take 5 mg by mouth in the morning and at 2 PM- in conjunction with one 10 mg tablet  to equal a total dose of 15 mg    [provider]  metoprolol  tartrate (LOPRESSOR ) 25 MG tablet Take 25 mg by mouth daily. 10/15/20   [provider]  MINERAL ICE 2 % GEL Apply 1 application  topically 2 (two) times daily as needed (for pain).    [provider]  Omega-3 Fatty Acids (FISH OIL PO) Take 1 capsule by mouth daily.    [provider]  promethazine  (PHENERGAN ) 25 MG tablet Take 25 mg by mouth as needed for nausea or vomiting. 02/05/21   [provider]  rOPINIRole  (REQUIP ) 0.5 MG tablet TAKE 1 TABLET BY MOUTH EVERY DAY AFTER SUPPER AND TAKE 2 TABLETS AT BEDTIME Patient taking differently: Take 0.5-1 mg by mouth at bedtime. 05/17/22   Lind Repine, MD  rosuvastatin  (CRESTOR ) 5 MG tablet Take 1 tablet (5 mg total) by mouth daily. Patient not taking: Reported on 07/14/2023 11/09/19 07/14/23  Sheryle Donning, MD  tiZANidine  (ZANAFLEX ) 2 MG tablet Take 2 mg by mouth at bedtime.    [provider]  traMADol  (ULTRAM ) 50 MG tablet Take 50 mg by mouth 2 (two) times daily as needed (for pain).    [provider]  TYLENOL  8 HOUR ARTHRITIS PAIN 650 MG CR tablet Take 650 mg by mouth  every 8 (eight) hours as needed for pain.    [provider]  UNABLE TO FIND Take 60 mg by mouth at bedtime. Med Name: Delta [id number]    [provider]      Allergies    Geodon [ziprasidone hydrochloride], Lithium, Talwin [pentazocine], Toradol [ketorolac tromethamine], Abilify  [aripiprazole ], Compazine [prochlorperazine edisylate], Latuda  [lurasidone  hcl], Pristiq  [desvenlafaxine  succinate er], Rexulti  [brexpiprazole ], and Diflucan [fluconazole]    Review of Systems   Review of Systems  Physical Exam Updated Vital Signs BP (!) 147/78   Pulse 68   Temp 98.1 F (36.7 C) (Oral)   Resp 14   Ht 5\' 3"  (1.6 m)   Wt 102.3 kg   SpO2 100%   BMI 39.95 kg/m  Physical Exam Vitals and nursing note reviewed.  Constitutional:      General: She is not in acute distress.    Appearance: She is well-developed. She is not diaphoretic.  HENT:     Head: Normocephalic and atraumatic.  Eyes:     Conjunctiva/sclera: Conjunctivae normal.  Cardiovascular:     Rate and Rhythm: Normal rate and regular rhythm.     Heart sounds: Normal heart sounds. No murmur heard.    No friction rub. No gallop.  Pulmonary:     Effort: Pulmonary effort is normal. No respiratory distress.     Breath sounds: Normal breath sounds. No wheezing or rales.  Abdominal:     General: There is no distension.     Palpations: Abdomen is soft.     Tenderness: There is no abdominal tenderness. There is no guarding.  Musculoskeletal:        General: No tenderness.     Cervical back: Normal range of motion.  Skin:    General: Skin is warm and dry.     Findings: No erythema or rash.  Neurological:     Mental Status: She is alert and oriented to person, place, and time.  Psychiatric:        Mood and Affect: Mood is anxious and depressed. Affect is tearful.        Thought Content: Thought content does not include homicidal or suicidal (not at this time) ideation.  Thought content does not include homicidal or suicidal  plan.     ED Results / Procedures / Treatments   Labs (all labs ordered are listed, but only abnormal results are displayed) Labs Reviewed  COMPREHENSIVE METABOLIC PANEL WITH GFR - Abnormal; Notable for the following components:      Result Value   Glucose, Bld 108 (*)    Creatinine, Ser 1.15 (*)    GFR, Estimated 49 (*)    All other components within normal limits  MAGNESIUM   PRO BRAIN NATRIURETIC PEPTIDE  TSH  CBC WITH DIFFERENTIAL/PLATELET  CBC WITH DIFFERENTIAL/PLATELET  T4, FREE  TROPONIN T, HIGH SENSITIVITY  TROPONIN T, HIGH SENSITIVITY    EKG None  Radiology DG Chest 2 View Result Date: 07/21/2023 CLINICAL DATA:  Palpitations EXAM: CHEST - 2 VIEW COMPARISON:  CT chest 07/13/2023 FINDINGS: Normal heart size and pulmonary vascularity. No focal airspace disease or consolidation in the lungs. No blunting of costophrenic angles. No pneumothorax. Mediastinal contours appear intact. Degenerative changes in the spine and shoulders. IMPRESSION: No active cardiopulmonary disease. Electronically Signed   By: Boyce Byes M.D.   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  EKG reviewed by me--reviewed yesterday ECG with EMS at which time she had symptoms which shows no arrhythmia, no acute STE, and ECG today also does not show any significant changes in comparison to prior. Monitored on telemetry with normal sinus rhythm.  Chest XR obtained and personally evaluated by me and radiology and  shows or other acute abnormality.  Labs completed and personally evaluated interpreted by me show no anemia, no clinically significant electrolyte abnormalities.  TSH WNL.  Troponin is less than 15 and again on recheck.  Do not feel history and exam are consistent with aortic dissection.  Has had episodes of primarily palpitations with anxiety, does not have any hypoxia, shortness of breath, tachycardia at this time, no asymmetric leg pain or swelling, low clinical suspicion for PE by history and exam.  Pro BNP not elevated, doubt CHF exacerbatoin.  She denies acute neurologic symptoms today.    She is medically cleared. Do recommend outpatient follow up with Cardiology.  She has significant anxiety and depression. Denies active SI however due to her degree of depression, anxiety, tearfulness, feelings of helplessness and difficulty functioning/taking care of herself I feel evaluation for inpatient psychiatric care is appropriate. She is voluntary.          Final Clinical Impression(s) / ED Diagnoses Final diagnoses:  Palpitations  Anxiety  Depression, unspecified depression type    Rx / DC Orders ED Discharge Orders     None          Scarlette Currier, MD 07/21/23 [phone]

## 2023-07-21 NOTE — ED Notes (Signed)
 ED Provider at bedside.

## 2023-07-21 NOTE — Telephone Encounter (Signed)
 Patient c/o Palpitations:  STAT if patient reporting lightheadedness, shortness of breath, or chest pain  How long have you had palpitations/irregular HR/ Afib? Are you having the symptoms now?   No  Are you currently experiencing lightheadedness, SOB or CP?   Small pain under her bra to the front of her chest  Do you have a history of afib (atrial fibrillation) or irregular heart rhythm? Yes  Have you checked your BP or HR? (document readings if available):   No  Are you experiencing any other symptoms?   Right side headache   Patient stated she just woke up and she is concerned her heart has been racing and she is sweating.   Patient noted she had a similar attack yesterday and paramedics noted she had an abnormal EKG Patient stated she took 1 mg Ativan  for anxiety around 2:20 pm and her heart is not pounding as much.

## 2023-07-22 MED ORDER — TRAMADOL HCL 50 MG PO TABS
50.0000 mg | ORAL_TABLET | Freq: Once | ORAL | Status: AC
  Administered 2023-07-22: 50 mg via ORAL
  Filled 2023-07-22: qty 1

## 2023-07-22 NOTE — ED Notes (Signed)
 Spoke with [facility] - no sitters available.

## 2023-07-22 NOTE — ED Notes (Signed)
 Patient requesting to leave and go to [facility] in the morning. Denies current SI/HI. MD notified. Requested by MD to reach out to [facility] provider regarding recommendations.

## 2023-07-22 NOTE — Discharge Instructions (Signed)
 You were seen today for depression and anxiety.  Your workup is reassuring.  Follow-up with behavioral health urgent care if you have ongoing needs.  Follow-up with your primary doctor.

## 2023-07-22 NOTE — ED Provider Notes (Signed)
 Patient requesting to be discharged.  She is here voluntarily.  She was requested for inpatient admission for ongoing depressive symptoms.  Patient states that she prefers to follow-up as an outpatient.  She contracts for safety.  Per TTS and her interviewing counselor, she is not actively suicidal and denies suicidality to me.  Will plan for discharge home with outpatient follow-up.   Rory Collard, MD 07/22/23 [phone]

## 2023-07-22 NOTE — Consult Note (Signed)
 Per ER nurse: Patient has expressed wanting to leave to go home. She is worried about her pets at home. She had said that she would prefer to be seen outpatient and said that she would like to go to [facility] tomorrow. Our EDP asked that I reached out to you to see get your opinion. She is currently voluntary.  During psych eval, patient requested inpatient admission. She was not suicidal or homicidal. She can be discharged with outpatient psych services if ER doc is okay with it.

## 2023-07-22 NOTE — ED Notes (Addendum)
 Cleared for outpatient pysch treatment per Threasa Flood, NP. MD notified.

## 2023-07-22 NOTE — ED Notes (Signed)
 Pt given warm blankets and she laid down to try to sleep.

## 2023-07-25 DIAGNOSIS — R202 Paresthesia of skin: Secondary | ICD-10-CM | POA: Diagnosis not present

## 2023-07-25 DIAGNOSIS — G629 Polyneuropathy, unspecified: Secondary | ICD-10-CM | POA: Diagnosis not present

## 2023-07-25 DIAGNOSIS — M179 Osteoarthritis of knee, unspecified: Secondary | ICD-10-CM | POA: Diagnosis not present

## 2023-07-25 DIAGNOSIS — M79606 Pain in leg, unspecified: Secondary | ICD-10-CM | POA: Diagnosis not present

## 2023-08-01 DIAGNOSIS — N1831 Chronic kidney disease, stage 3a: Secondary | ICD-10-CM | POA: Diagnosis not present

## 2023-08-01 DIAGNOSIS — E039 Hypothyroidism, unspecified: Secondary | ICD-10-CM | POA: Diagnosis not present

## 2023-08-01 DIAGNOSIS — E7800 Pure hypercholesterolemia, unspecified: Secondary | ICD-10-CM | POA: Diagnosis not present

## 2023-08-01 DIAGNOSIS — Z79899 Other long term (current) drug therapy: Secondary | ICD-10-CM | POA: Diagnosis not present

## 2023-08-01 DIAGNOSIS — M79606 Pain in leg, unspecified: Secondary | ICD-10-CM | POA: Diagnosis not present

## 2023-08-01 DIAGNOSIS — G4733 Obstructive sleep apnea (adult) (pediatric): Secondary | ICD-10-CM | POA: Diagnosis not present

## 2023-08-01 DIAGNOSIS — Z0000 Encounter for general adult medical examination without abnormal findings: Secondary | ICD-10-CM | POA: Diagnosis not present

## 2023-08-01 DIAGNOSIS — R110 Nausea: Secondary | ICD-10-CM | POA: Diagnosis not present

## 2023-08-01 DIAGNOSIS — J302 Other seasonal allergic rhinitis: Secondary | ICD-10-CM | POA: Diagnosis not present

## 2023-08-01 DIAGNOSIS — I10 Essential (primary) hypertension: Secondary | ICD-10-CM | POA: Diagnosis not present

## 2023-08-04 ENCOUNTER — Telehealth: Payer: Self-pay | Admitting: Cardiology

## 2023-08-04 MED ORDER — AMLODIPINE BESYLATE 5 MG PO TABS: 5.0000 mg | ORAL_TABLET | Freq: Every day | ORAL | 3 refills | Status: AC

## 2023-08-04 MED ORDER — HYDRALAZINE HCL 25 MG PO TABS: ORAL_TABLET | ORAL | 3 refills | Status: AC

## 2023-08-04 NOTE — Addendum Note (Signed)
 Addended by: Guss Legacy on: 08/04/2023 05:17 PM   Modules accepted: Orders

## 2023-08-04 NOTE — Telephone Encounter (Signed)
 Recommend: Add Amlodipine 5mg  daily for BP control.  Ensure taking Losartan -hydrochlorothiazide  twice per day as prescribed. Rx Hydralazine 25mg  PRN for SBP >180. May take up to twice per day.  follow up visit with Dr. [name] Gottron, APP, or PharmD in 2 weeks to reassess BP control. Okay to use TOC clot.  Donald Jacque S Nakiyah Beverley, NP

## 2023-08-04 NOTE — Telephone Encounter (Addendum)
 Returned call to pt.   Reviewed recommendations with pt, rx to pharmacy.   Reset pt mychart as requested, sending pt mychart message with sample medication schedule.   Pt worked into schedule ok per [employer]

## 2023-08-04 NOTE — Telephone Encounter (Signed)
 Pt c/o BP issue: STAT if pt c/o blurred vision, one-sided weakness or slurred speech.  STAT if BP is GREATER than 180/120 TODAY.  STAT if BP is LESS than 90/60 and SYMPTOMATIC TODAY  1. What is your BP concern? Elevated BP  2. Have you taken any BP medication today? yes  3. What are your last 5 BP readings? Right now 170/91 hr 100, this morning 164/88 hr 88, yesterday 170/89 hr 90   4. Are you having any other symptoms (ex. Dizziness, headache, blurred vision, passed out)? Shaky but has anxiety disorder as well per pt. States that she has left messages and has not gotten a return call.

## 2023-08-04 NOTE — Telephone Encounter (Signed)
 Patient transferred from call center,   Patient states she has been having elevated BP for over two weeks. She is upset that no one has gotten back to her, despite calls. Blood pressure 170/91 HR 10, shortly before phone call. This morning 164/88 and yesterday 170/89 HR 90. She states she did have some shortness of breath when taking the sheets off her bed, felt weak and could feel her heart pounding. She states that this has couple times, but only ever with activity.   Has taken her Losartan - hydrochlorothiazide ,  Metoprolol , levothyroxine , Cymbalta , & Ritalin .   Advised patient we would send information for review. Patient then asked if we would call her back today. Advised we would be calling her once we have recommendations. Patient states she would like to schedule to see Dr. [name] Gottron, advised next available is August. Pt states" oh wonderful, I will see her in the Emergency Department"

## 2023-08-16 ENCOUNTER — Ambulatory Visit (HOSPITAL_BASED_OUTPATIENT_CLINIC_OR_DEPARTMENT_OTHER): Admitting: Cardiology

## 2023-08-16 ENCOUNTER — Encounter (HOSPITAL_BASED_OUTPATIENT_CLINIC_OR_DEPARTMENT_OTHER): Payer: Self-pay | Admitting: Cardiology

## 2023-08-16 VITALS — BP 118/66 | HR 80 | Ht 64.0 in | Wt 226.6 lb

## 2023-08-16 DIAGNOSIS — I2510 Atherosclerotic heart disease of native coronary artery without angina pectoris: Secondary | ICD-10-CM

## 2023-08-16 DIAGNOSIS — E7800 Pure hypercholesterolemia, unspecified: Secondary | ICD-10-CM | POA: Diagnosis not present

## 2023-08-16 DIAGNOSIS — R0989 Other specified symptoms and signs involving the circulatory and respiratory systems: Secondary | ICD-10-CM | POA: Diagnosis not present

## 2023-08-16 DIAGNOSIS — Z789 Other specified health status: Secondary | ICD-10-CM

## 2023-08-16 NOTE — Patient Instructions (Signed)
 Medication Instructions:  Your physician recommends that you continue on your current medications as directed. Please refer to the Current Medication list given to you today.   Follow-Up: Please follow up in 12 months with Dr. [name] Gottron, Slater Duncan, NP or Neomi Banks, NP

## 2023-08-16 NOTE — Progress Notes (Signed)
 Cardiology Office Note:  .   Date:  08/16/2023  ID:  [name], DOB [birth year], MRN [id number] PCP: Jimmey Mould, MD  [facility] Providers Cardiologist:  Sheryle Donning, MD {  History of Present Illness: .   [name] is a 78 y.o. female with a hx of PVCs, paroxysmal atrial tachycardia s/p ablation, hypertension, CAD, renal cell carcinoma s/p nephrectomy who is seen for follow up today. I initially met her 02/13/19 as a new patient to me/prior patient of Dr. Micael Adas for the evaluation and management of her above issues.   Pertinent CV history: LHC 2018 with nonobstructive disease recommended for medical management. Myoview  07/2020 low risk study. Echo 09/2021 predominantly normal sinus rhythm with brief episodes of SVT. Echo November 2023 normal LVEF 55 to 60%, no RWMA, moderate LVH, normal PASP, bilateral atria mildly to moderately dilated, trivial MR, aortic sclerosis without stenosis.   Today: She called the office on 08/04/23 reporting elevated blood pressure readings of 170/91, 164/88 and 170/89. Noted two weeks of elevated blood pressure with shortness of breath/heart pounding. Recommended to add amlodipine  5 mg daily, continue taking losartan -hydrochlorothiazide  twice daily, and given PRN hydralazine  for systolic blood pressure >180. It is noted that she is on metoprolol  tartrate 25 mg only in the AM--at our prior visit in 10/2020, this was dosed BID.  Today presents with low initial BP, 98/58, improved on recheck. Does not have any symptoms today. When it runs high, she feels "heavy" and strange/sick feeling. Brings a blood pressure log with her today. Much improved readings, mostly 120s-130s systolic, highest 160/99 (one time), lowest at home 117/75.  Reviewed how she is taking her medications.  Losartan -hydrochlorothiazide  50-12.5 mg BID Amlodipine  5 mg usually an hour after she takes the losartan  combo in the morning Takes metoprolol  around 3 PM,  taking only once a day. Discussed changing to succinate given once a day dosing  Has not needed PRN hydralazine .  Had three episodes where she felt like she might pass out--heart pounding, sweating, dizzy. Went to ER, heart was ok. Notes that her depression and anxiety has been very difficult since about November. Intermittently tearful on exam today.  Asking to review her CT report together, discussed.  Recheck BP matches her home numbers. She recently bought a new cuff and numbers have been consistent.  ROS: Denies chest pain, shortness of breath at rest or with normal exertion. No PND, orthopnea, LE edema or unexpected weight gain. No syncope or palpitations. ROS otherwise negative except as noted.   Studies Reviewed: Aaron Aas    EKG:       Physical Exam:   VS:  BP 118/66   Pulse 80   Ht 5\' 4"  (1.626 m)   Wt 226 lb 9.6 oz (102.8 kg)   SpO2 98%   BMI 38.90 kg/m    Wt Readings from Last 3 Encounters:  08/16/23 226 lb 9.6 oz (102.8 kg)  07/21/23 225 lb 8.5 oz (102.3 kg)  07/20/23 225 lb 9.6 oz (102.3 kg)    GEN: Well nourished, well developed in no acute distress HEENT: Normal, moist mucous membranes NECK: No JVD CARDIAC: regular rhythm, normal S1 and S2, no rubs or gallops. No murmur. VASCULAR: Radial and DP pulses 2+ bilaterally. No carotid bruits RESPIRATORY:  Clear to auscultation without rales, wheezing or rhonchi  ABDOMEN: Soft, non-tender, non-distended MUSCULOSKELETAL:  Ambulates independently SKIN: Warm and dry, no edema NEUROLOGIC:  Alert and oriented x 3. No focal neuro deficits noted. PSYCHIATRIC:  Normal affect    ASSESSMENT AND PLAN: .    Coronary disease by cath 2018, exercise intolerance -based on cath in 2018, would aim for medical management as much as tolerated. -nuclear stress test 2022 very reassuring, normal EF, no evidence of ischemia -recommended aspirin , statin in the past. See below -beta blocker as below   Hypercholesterolemia: LDL goal <70, last  was 114 per [facility] on 08/01/23 -has not tolerated multiple statins in the past.  -we have discussed additional therapies, but with her extensive history of medication intolerances, she is hesitant to try any other medications   Hypertension: at goal in office today -had lability prior to start of amlodipine , but now has been more stable, continue amlodipine  5 mg daily -continue losartan -HCTZ 50-12.5 mg BID -she is now taking metoprolol  only once/day. Discussed that the tartrate form is short acting and better as BID dosing, but she feels she is doing well on once daily dosing. Also offered to change to succinate for long acting dose. Discussed BP/HR spikes in the AM and other symptoms to watch for with only daily tartrate dosing. If she notices these she will contact me to discuss alternative.   Anxiety/depression: she is being followed closely for management of these. Has struggled with this in recent months  CV risk counseling and prevention -recommend heart healthy/Mediterranean diet, with whole grains, fruits, vegetable, fish, lean meats, nuts, and olive oil. Limit salt. -recommend moderate walking, 3-5 times/week for 30-50 minutes each session. Aim for at least 150 minutes.week. Goal should be pace of 3 miles/hours, or walking 1.5 miles in 30 minutes -recommend avoidance of tobacco products. Avoid excess alcohol .  Dispo: 1 year  Signed, Sheryle Donning, MD   Sheryle Donning, MD, PhD, [facility]  [facility]  [place]  [facility] at [facility] [address], [address] [place], [place] [address] [phone]

## 2023-08-18 ENCOUNTER — Ambulatory Visit (INDEPENDENT_AMBULATORY_CARE_PROVIDER_SITE_OTHER): Payer: Self-pay | Admitting: Behavioral Health

## 2023-08-18 ENCOUNTER — Encounter: Payer: Self-pay | Admitting: Behavioral Health

## 2023-08-18 VITALS — BP 143/84 | HR 65 | Ht 64.0 in | Wt 221.0 lb

## 2023-08-18 DIAGNOSIS — F902 Attention-deficit hyperactivity disorder, combined type: Secondary | ICD-10-CM

## 2023-08-18 DIAGNOSIS — F4310 Post-traumatic stress disorder, unspecified: Secondary | ICD-10-CM

## 2023-08-18 DIAGNOSIS — F314 Bipolar disorder, current episode depressed, severe, without psychotic features: Secondary | ICD-10-CM

## 2023-08-18 DIAGNOSIS — F411 Generalized anxiety disorder: Secondary | ICD-10-CM | POA: Diagnosis not present

## 2023-08-18 MED ORDER — LORAZEPAM 1 MG PO TABS: 1.0000 mg | ORAL_TABLET | Freq: Every day | ORAL | 1 refills | Status: DC | PRN

## 2023-08-18 MED ORDER — DULOXETINE HCL 60 MG PO CPEP
60.0000 mg | ORAL_CAPSULE | Freq: Every day | ORAL | 1 refills | Status: DC
Start: 2023-08-18 — End: 2023-09-21
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
## 2023-08-26 ENCOUNTER — Telehealth: Payer: Self-pay | Admitting: Behavioral Health

## 2023-08-26 NOTE — Telephone Encounter (Signed)
 Pt is requesting to see a counselor. Who do you recommend ?

## 2023-08-26 NOTE — Telephone Encounter (Signed)
 Patient lvm stating a Rx is needed on the Ativan . Appointment scheduled for 10/13/23

## 2023-08-26 NOTE — Telephone Encounter (Signed)
 LF was sent to [facility] on [address] on 5/29. LVM per DPR with this information.

## 2023-08-29 NOTE — Telephone Encounter (Signed)
 Probably Debbie

## 2023-09-02 ENCOUNTER — Telehealth: Payer: Self-pay | Admitting: Behavioral Health

## 2023-09-02 NOTE — Telephone Encounter (Signed)
 Pt  LVM @ 10:51a stating I had something strange happen to me.  It was like a hallucination.  She said she would let him know if it happened again.  She didn't say if she wanted a call back.  Next appt 7/24

## 2023-09-05 NOTE — Telephone Encounter (Signed)
 Pt reports has not had another event. Has no concerns at this time. Will let us  know if sx recur.

## 2023-09-09 ENCOUNTER — Telehealth: Payer: Self-pay | Admitting: Behavioral Health

## 2023-09-09 NOTE — Telephone Encounter (Signed)
 Patient lvm stating she may have to discontinue the Auvelity. She stated her Bp continues to be elevated. A side effect per her investigating. Please advise.

## 2023-09-12 NOTE — Telephone Encounter (Signed)
 LVM to [facility]

## 2023-09-13 NOTE — Telephone Encounter (Signed)
 Left second VM to [facility].

## 2023-09-14 NOTE — Telephone Encounter (Signed)
 LVM for patient to provide some BP results so that Redell can decide what to do about the Auvelity.

## 2023-09-14 NOTE — Telephone Encounter (Signed)
 Pt Lvm @ 12:58p that she was returning Bridget's call

## 2023-09-20 NOTE — Telephone Encounter (Signed)
 LVM to [facility] to get some BP readings.

## 2023-09-21 ENCOUNTER — Encounter: Payer: Self-pay | Admitting: Behavioral Health

## 2023-09-21 ENCOUNTER — Ambulatory Visit: Admitting: Behavioral Health

## 2023-09-21 DIAGNOSIS — F902 Attention-deficit hyperactivity disorder, combined type: Secondary | ICD-10-CM | POA: Diagnosis not present

## 2023-09-21 DIAGNOSIS — F411 Generalized anxiety disorder: Secondary | ICD-10-CM

## 2023-09-21 DIAGNOSIS — F314 Bipolar disorder, current episode depressed, severe, without psychotic features: Secondary | ICD-10-CM | POA: Diagnosis not present

## 2023-09-21 DIAGNOSIS — F4310 Post-traumatic stress disorder, unspecified: Secondary | ICD-10-CM

## 2023-09-21 MED ORDER — LORAZEPAM 1 MG PO TABS: 1.0000 mg | ORAL_TABLET | Freq: Every day | ORAL | 3 refills | Status: DC | PRN

## 2023-09-21 MED ORDER — DULOXETINE HCL 60 MG PO CPEP: 60.0000 mg | ORAL_CAPSULE | Freq: Every day | ORAL | 3 refills | Status: DC

## 2023-09-21 NOTE — Progress Notes (Signed)
 [facility]  Patient ID: [name],  MRN: [id number]  PCP: Brooke Carlin Redbird, MD  Date of Evaluation: 09/21/2023 Time spent:30 minutes  Chief Complaint:  Chief Complaint   Manic Behavior; Anxiety; Depression; Follow-up; Medication Refill; Patient Education     HISTORY/CURRENT STATUS: HPI [name], 78 year old female presents to this office for initial visit and to establish care.  Collateral information should be considered reliable. Says that her seasonal affect has improved in which it normally does during the summer. She is feeling about 25% better with anxiety and depression. She is not tearful today but still sad about her long term relationship with boyfriend. Says that she had to set boundaries with him for her own mental well being. Say that he told her that he still did not trust her. She rates her depression today at 3/10, and anxiety at 3/10.    Is sleeping 7-8 hours per night. Good appetite. Verbally contracts for safety with this Clinical research associate and currently feels safe.  Lives alone in [place] .  Is retired and not working.  Has a history of past hospitalizations and presentation to [facility] and ER.  Last ER visit was 09/22/2020 for SI.   Individual Medical History/ Review of Systems: Changes? :No   Allergies: Geodon [ziprasidone hydrochloride], Lithium, Talwin [pentazocine], Toradol [ketorolac tromethamine], Abilify  [aripiprazole ], Compazine [prochlorperazine edisylate], Latuda  [lurasidone  hcl], Pristiq  [desvenlafaxine  succinate er], Rexulti  [brexpiprazole ], and Diflucan [fluconazole]  Current Medications:  Current Outpatient Medications:    amLODipine  (NORVASC ) 5 MG tablet, Take 1 tablet (5 mg total) by mouth daily., Disp: 90 tablet, Rfl: 3   B Complex Vitamins (B COMPLEX PO), Take 1 mL by mouth daily., Disp: , Rfl:    DULoxetine  (CYMBALTA ) 30 MG capsule, Take 30 mg by mouth daily., Disp: , Rfl:    DULoxetine  (CYMBALTA ) 60 MG capsule, Take 1 capsule  (60 mg total) by mouth daily., Disp: 30 capsule, Rfl: 1   fluticasone (FLONASE) 50 MCG/ACT nasal spray, Place 1 spray into both nostrils in the morning., Disp: , Rfl:    hydrALAZINE  (APRESOLINE ) 25 MG tablet, May take up to twice daily for SBP greater than 180, Disp: 270 tablet, Rfl: 3   hydrOXYzine  (ATARAX ) 25 MG tablet, Take 1 tablet (25 mg total) by mouth 3 (three) times daily as needed. (Patient not taking: Reported on 08/18/2023), Disp: 30 tablet, Rfl: 0   levothyroxine  (SYNTHROID ) 88 MCG tablet, Take 88 mcg by mouth daily before breakfast., Disp: , Rfl:    Lidocaine  HCl (ASPERCREME LIDOCAINE ) 4 % CREA, Apply 1 application  topically 2 (two) times daily as needed (for pain)., Disp: , Rfl:    LORazepam  (ATIVAN ) 1 MG tablet, Take 1 tablet (1 mg total) by mouth daily as needed., Disp: 30 tablet, Rfl: 1   losartan -hydrochlorothiazide  (HYZAAR) 50-12.5 MG tablet, TAKE 1 TABLET BY MOUTH TWICE A DAY, Disp: 90 tablet, Rfl: 1   MAGNESIUM  PO, Take 500 mg by mouth in the morning., Disp: , Rfl:    methylphenidate  (RITALIN ) 10 MG tablet, Take 10 mg by mouth See admin instructions. Take 10 mg by mouth in the morning and at 2 PM- in conjunction with one 5 mg tablet to equal a total dose of 15 mg, Disp: , Rfl:    methylphenidate  (RITALIN ) 5 MG tablet, Take 5 mg by mouth See admin instructions. Take 5 mg by mouth in the morning and at 2 PM- in conjunction with one 10 mg tablet to equal a total dose of 15 mg, Disp: ,  Rfl:    metoprolol  tartrate (LOPRESSOR ) 25 MG tablet, Take 25 mg by mouth 2 (two) times daily. If HR is 60 or less pt holds the ( 25 mg) pm dose., Disp: , Rfl:    MINERAL ICE 2 % GEL, Apply 1 application  topically 2 (two) times daily as needed (for pain)., Disp: , Rfl:    Omega-3 Fatty Acids (FISH OIL PO), Take 1 capsule by mouth daily., Disp: , Rfl:    pantoprazole  (PROTONIX ) 40 MG tablet, Take 40 mg by mouth daily., Disp: , Rfl:    promethazine  (PHENERGAN ) 25 MG tablet, Take 25 mg by mouth as needed  for nausea or vomiting., Disp: , Rfl:    rOPINIRole  (REQUIP ) 0.5 MG tablet, TAKE 1 TABLET BY MOUTH EVERY DAY AFTER SUPPER AND TAKE 2 TABLETS AT BEDTIME (Patient taking differently: Take 0.5-1 mg by mouth at bedtime.), Disp: 270 tablet, Rfl: 3   traMADol  (ULTRAM ) 50 MG tablet, Take 50 mg by mouth 2 (two) times daily as needed (for pain)., Disp: , Rfl:    TYLENOL  8 HOUR ARTHRITIS PAIN 650 MG CR tablet, Take 650 mg by mouth every 8 (eight) hours as needed for pain., Disp: , Rfl:  Medication Side Effects: none  Family Medical/ Social History: Changes? No  MENTAL HEALTH EXAM:  There were no vitals taken for this visit.There is no height or weight on file to calculate BMI.  General Appearance: Casual, Neat, and Well Groomed  Eye Contact:  Good  Speech:  Clear and Coherent  Volume:  Normal  Mood:  Anxious, Depressed, and Dysphoric  Affect:  Depressed and Anxious  Thought Process:  Coherent  Orientation:  Full (Time, Place, and Person)  Thought Content: Logical   Suicidal Thoughts:  No  Homicidal Thoughts:  No  Memory:  WNL  Judgement:  Good  Insight:  Good  Psychomotor Activity:  Normal  Concentration:  Concentration: Good  Recall:  Good  Fund of Knowledge: Good  Language: Good  Assets:  Desire for Improvement  ADL's:  Intact  Cognition: WNL  Prognosis:  Good    DIAGNOSES:    ICD-10-CM   1. Severe bipolar I disorder, current or most recent episode depressed (HCC)  F31.4     2. Attention deficit hyperactivity disorder (ADHD), combined type  F90.2     3. PTSD (post-traumatic stress disorder)  F43.10     4. Generalized anxiety disorder  F41.1       Receiving Psychotherapy: No    RECOMMENDATIONS:   Greater than 50% of 30 min face to face time with patient was spent on counseling and coordination of care. She has been scheduled for counseling with [name]. Discussed her report of feeling about 25% better since last visit. She acknowledges have seasonal affect. She  was unable to continue with Auvelity due to increase in BP.  Reinforced that we are medication maxed and that ECT may be only other option. She is not candidate for Sprivato.     We agreed today to:   To  continue Cymbalta  to 60 mg daily She will continue Ritalin  15 mg daily. Rx written separately as a 10 mg and 5 mg script. She will continue Ativan  1 mg daily as needed for severe anxiety Will report worsening symptoms promptly to follow-up in 8 weeks to reassess Provided emergency contact information Discussed potential benefits, risk, and side effects of benzodiazepines to include potential risk of tolerance and dependence, as well as possible drowsiness.  Advised patient not  to drive if experiencing drowsiness and to take lowest possible effective dose to minimize risk of dependence and tolerance.  Reviewed PDMP     Redell DELENA Pizza, NP

## 2023-09-28 ENCOUNTER — Telehealth: Payer: Self-pay | Admitting: Behavioral Health

## 2023-09-28 ENCOUNTER — Telehealth (HOSPITAL_COMMUNITY): Payer: Self-pay | Admitting: Psychiatry

## 2023-09-28 NOTE — Telephone Encounter (Signed)
 D:  Pt called the [facility] Case Mgr stating that she would like to return to [facility].  Ricka I am not functioning very well.  Pt states she is interested in returning but wanted an in person group not virtual.  Reports she is currently seeing Redell Pizza, NP and will be seeing Deane Sprang (therapist) at [facility]  Pt denies SI/HI or A/V hallucinations.  A:  Provided pt with support.  Recommended [facility] or [place] for in person groups.  Provided pt with phone #'s.  Encouraged pt to call the case mgr back if she changes her mind about virtual.  R:  Pt receptive.

## 2023-09-28 NOTE — Telephone Encounter (Signed)
 Please see message from patient and advise.  ?

## 2023-09-28 NOTE — Telephone Encounter (Signed)
 Patient called in regards for a program at [facility] for intensive outpatient treatment. She is not doing well and feels like she has to do something. She would like BW to rtc as she would like his opinion is she should do this. Ph: [phone] Appt 8/26

## 2023-09-29 NOTE — Telephone Encounter (Signed)
 Yes, if that is what she feel like she needs that is a good thing and may be helpful.  She does not need a referral. She can reach out to [facility] directly for approval for the program.

## 2023-09-30 NOTE — Telephone Encounter (Signed)
LVM per DPR with info.  

## 2023-10-11 DIAGNOSIS — M25571 Pain in right ankle and joints of right foot: Secondary | ICD-10-CM | POA: Diagnosis not present

## 2023-10-13 ENCOUNTER — Ambulatory Visit: Admitting: Behavioral Health

## 2023-10-13 DIAGNOSIS — M25561 Pain in right knee: Secondary | ICD-10-CM | POA: Diagnosis not present

## 2023-10-13 DIAGNOSIS — M25571 Pain in right ankle and joints of right foot: Secondary | ICD-10-CM | POA: Diagnosis not present

## 2023-10-20 DIAGNOSIS — I5032 Chronic diastolic (congestive) heart failure: Secondary | ICD-10-CM | POA: Diagnosis not present

## 2023-10-20 DIAGNOSIS — N183 Chronic kidney disease, stage 3 unspecified: Secondary | ICD-10-CM | POA: Diagnosis not present

## 2023-10-20 DIAGNOSIS — E039 Hypothyroidism, unspecified: Secondary | ICD-10-CM | POA: Diagnosis not present

## 2023-10-21 ENCOUNTER — Encounter: Payer: Self-pay | Admitting: Behavioral Health

## 2023-10-21 ENCOUNTER — Ambulatory Visit: Admitting: Behavioral Health

## 2023-10-21 DIAGNOSIS — F314 Bipolar disorder, current episode depressed, severe, without psychotic features: Secondary | ICD-10-CM

## 2023-10-21 DIAGNOSIS — F902 Attention-deficit hyperactivity disorder, combined type: Secondary | ICD-10-CM

## 2023-10-21 DIAGNOSIS — F4310 Post-traumatic stress disorder, unspecified: Secondary | ICD-10-CM | POA: Diagnosis not present

## 2023-10-21 DIAGNOSIS — F411 Generalized anxiety disorder: Secondary | ICD-10-CM

## 2023-10-21 MED ORDER — METHYLPHENIDATE HCL 5 MG PO TABS: 5.0000 mg | ORAL_TABLET | ORAL | 0 refills | Status: DC

## 2023-10-21 MED ORDER — LORAZEPAM 1 MG PO TABS: 1.0000 mg | ORAL_TABLET | Freq: Every day | ORAL | 3 refills | Status: AC | PRN

## 2023-10-21 MED ORDER — DULOXETINE HCL 60 MG PO CPEP: 60.0000 mg | ORAL_CAPSULE | Freq: Every day | ORAL | 3 refills | Status: DC

## 2023-10-21 MED ORDER — METHYLPHENIDATE HCL 10 MG PO TABS: 10.0000 mg | ORAL_TABLET | ORAL | 0 refills | Status: DC

## 2023-10-21 NOTE — Progress Notes (Signed)
 [facility]  Patient ID: [name],  MRN: [id number]  PCP: Okey Carlin Redbird, MD  Date of Evaluation: 10/21/2023 Time spent:30 minutes  Chief Complaint:  Chief Complaint   Anxiety; Depression; Follow-up; Medication Refill; Patient Education; Fatigue     HISTORY/CURRENT STATUS: HPI [name], 78 year old female presents to this office for initial visit and to establish care.  Collateral information should be considered reliable. Feeling much better this visit overall except for sleep. However she has untreated sleep apnea. Has to be very careful to not  further depress breathing. She rates her depression today at 3/10, and anxiety at 3/10.    Is sleeping 7-8 hours per night. Good appetite. Verbally contracts for safety with this Clinical research associate and currently feels safe.  Lives alone in [place] .  Is retired and not working.  Has a history of past hospitalizations and presentation to [facility] and ER.  Last ER visit was 09/22/2020 for SI.       Individual Medical History/ Review of Systems: Changes? :No   Allergies: Geodon [ziprasidone hydrochloride], Lithium, Talwin [pentazocine], Toradol [ketorolac tromethamine], Abilify  [aripiprazole ], Compazine [prochlorperazine edisylate], Latuda  [lurasidone  hcl], Pristiq  [desvenlafaxine  succinate er], Rexulti  [brexpiprazole ], and Diflucan [fluconazole]  Current Medications:  Current Outpatient Medications:    amLODipine  (NORVASC ) 5 MG tablet, Take 1 tablet (5 mg total) by mouth daily., Disp: 90 tablet, Rfl: 3   B Complex Vitamins (B COMPLEX PO), Take 1 mL by mouth daily., Disp: , Rfl:    DULoxetine  (CYMBALTA ) 30 MG capsule, Take 30 mg by mouth daily., Disp: , Rfl:    DULoxetine  (CYMBALTA ) 60 MG capsule, Take 1 capsule (60 mg total) by mouth daily., Disp: 30 capsule, Rfl: 3   fluticasone (FLONASE) 50 MCG/ACT nasal spray, Place 1 spray into both nostrils in the morning., Disp: , Rfl:    hydrALAZINE  (APRESOLINE ) 25 MG tablet,  May take up to twice daily for SBP greater than 180, Disp: 270 tablet, Rfl: 3   hydrOXYzine  (ATARAX ) 25 MG tablet, Take 1 tablet (25 mg total) by mouth 3 (three) times daily as needed. (Patient not taking: Reported on 08/18/2023), Disp: 30 tablet, Rfl: 0   levothyroxine  (SYNTHROID ) 88 MCG tablet, Take 88 mcg by mouth daily before breakfast., Disp: , Rfl:    Lidocaine  HCl (ASPERCREME LIDOCAINE ) 4 % CREA, Apply 1 application  topically 2 (two) times daily as needed (for pain)., Disp: , Rfl:    LORazepam  (ATIVAN ) 1 MG tablet, Take 1 tablet (1 mg total) by mouth daily as needed., Disp: 30 tablet, Rfl: 3   losartan -hydrochlorothiazide  (HYZAAR) 50-12.5 MG tablet, TAKE 1 TABLET BY MOUTH TWICE A DAY, Disp: 90 tablet, Rfl: 1   MAGNESIUM  PO, Take 500 mg by mouth in the morning., Disp: , Rfl:    methylphenidate  (RITALIN ) 10 MG tablet, Take 1 tablet (10 mg total) by mouth See admin instructions. Take 10 mg by mouth in the morning and at 2 PM- in conjunction with one 5 mg tablet to equal a total dose of 15 mg, Disp: 30 tablet, Rfl: 0   methylphenidate  (RITALIN ) 5 MG tablet, Take 1 tablet (5 mg total) by mouth See admin instructions. Take 5 mg by mouth in the morning and at 2 PM- in conjunction with one 10 mg tablet to equal a total dose of 15 mg, Disp: 30 tablet, Rfl: 0   metoprolol  tartrate (LOPRESSOR ) 25 MG tablet, Take 25 mg by mouth 2 (two) times daily. If HR is 60 or less pt holds the ( 25  mg) pm dose., Disp: , Rfl:    MINERAL ICE 2 % GEL, Apply 1 application  topically 2 (two) times daily as needed (for pain)., Disp: , Rfl:    Omega-3 Fatty Acids (FISH OIL PO), Take 1 capsule by mouth daily., Disp: , Rfl:    pantoprazole  (PROTONIX ) 40 MG tablet, Take 40 mg by mouth daily., Disp: , Rfl:    promethazine  (PHENERGAN ) 25 MG tablet, Take 25 mg by mouth as needed for nausea or vomiting., Disp: , Rfl:    rOPINIRole  (REQUIP ) 0.5 MG tablet, TAKE 1 TABLET BY MOUTH EVERY DAY AFTER SUPPER AND TAKE 2 TABLETS AT BEDTIME  (Patient taking differently: Take 0.5-1 mg by mouth at bedtime.), Disp: 270 tablet, Rfl: 3   traMADol  (ULTRAM ) 50 MG tablet, Take 50 mg by mouth 2 (two) times daily as needed (for pain)., Disp: , Rfl:    TYLENOL  8 HOUR ARTHRITIS PAIN 650 MG CR tablet, Take 650 mg by mouth every 8 (eight) hours as needed for pain., Disp: , Rfl:  Medication Side Effects: none  Family Medical/ Social History: Changes? No  MENTAL HEALTH EXAM:  There were no vitals taken for this visit.There is no height or weight on file to calculate BMI.  General Appearance: Casual, Neat, and Well Groomed  Eye Contact:  Good  Speech:  Clear and Coherent  Volume:  Normal  Mood:  Anxious and Depressed  Affect:  Depressed and Anxious  Thought Process:  Coherent  Orientation:  Full (Time, Place, and Person)  Thought Content: Logical   Suicidal Thoughts:  No  Homicidal Thoughts:  No  Memory:  WNL  Judgement:  Good  Insight:  Good  Psychomotor Activity:  Normal  Concentration:  Concentration: Good  Recall:  Good  Fund of Knowledge: Good  Language: Good  Assets:  Desire for Improvement  ADL's:  Intact  Cognition: WNL  Prognosis:  Good    DIAGNOSES:    ICD-10-CM   1. Severe bipolar I disorder, current or most recent episode depressed (HCC)  F31.4 DULoxetine  (CYMBALTA ) 60 MG capsule    2. Attention deficit hyperactivity disorder (ADHD), combined type  F90.2 methylphenidate  (RITALIN ) 10 MG tablet    methylphenidate  (RITALIN ) 5 MG tablet    3. PTSD (post-traumatic stress disorder)  F43.10 DULoxetine  (CYMBALTA ) 60 MG capsule    4. Generalized anxiety disorder  F41.1 DULoxetine  (CYMBALTA ) 60 MG capsule    LORazepam  (ATIVAN ) 1 MG tablet      Receiving Psychotherapy: No    RECOMMENDATIONS:  Greater than 50% of 30 min face to face time with patient was spent on counseling and coordination of care. Discussed her moderate improvement since last visit.  Reinforced that we are medication maxed and that ECT may be only  other option. She is not candidate for Sprivato.     We agreed today to:   To  continue Cymbalta  to 60 mg daily She will continue Ritalin  15 mg daily. Rx written separately as a 10 mg and 5 mg script. She will continue Ativan  1 mg daily as needed for severe anxiety Will report worsening symptoms promptly to follow-up in 12 weeks to reassess Provided emergency contact information Discussed potential benefits, risk, and side effects of benzodiazepines to include potential risk of tolerance and dependence, as well as possible drowsiness.  Advised patient not to drive if experiencing drowsiness and to take lowest possible effective dose to minimize risk of dependence and tolerance.  Reviewed PDMP       [name]  Brooke Portlock, NP

## 2023-10-24 ENCOUNTER — Telehealth: Payer: Self-pay | Admitting: Cardiology

## 2023-10-24 NOTE — Telephone Encounter (Signed)
 Spoke with pt who complains of elevated BP's with headache and am dizziness.  Denies CP, SOB or edema.  Pt is taking medications as prescribed.  She has not been able to take Metoprolol  bid as she reports her pulse is sometimes at 60 or below.  She has not taken Hydralazine  because she has not had a reading of 180 or greater.  She states she could probably do better following a low sodium diet. Provided education on heart healthy diet.  Pt reports last 5 BP readings as below.  Will forward for Dr Christopher's review.  Pt verbalizes understanding and agrees with current plan.  160/95 - 70 144/94 - 60 156/88 - 83 155/96 - 62 161/97 - 76

## 2023-10-24 NOTE — Telephone Encounter (Signed)
 Pt of Dr. Lonni. Please advise.

## 2023-10-24 NOTE — Telephone Encounter (Signed)
 1. What is your BP concern?  BP is elevated. Med   2. Have you taken any BP medication today? Yes, Amlodipine  and Losartan  this morning. Patient mentions recently being started on additional BP medication but BP still stays elevated. Patient has concerns that she may have a stroke. Please advise.  3. What are your last 5 BP readings? 160/95 a few hours after medication  4. Are you having any other symptoms (ex. Dizziness, headache, blurred vision, passed out)?  Dizziness, tiredness

## 2023-10-25 DIAGNOSIS — I10 Essential (primary) hypertension: Secondary | ICD-10-CM | POA: Diagnosis not present

## 2023-10-25 DIAGNOSIS — G629 Polyneuropathy, unspecified: Secondary | ICD-10-CM | POA: Diagnosis not present

## 2023-10-25 DIAGNOSIS — M791 Myalgia, unspecified site: Secondary | ICD-10-CM | POA: Diagnosis not present

## 2023-10-25 DIAGNOSIS — N1831 Chronic kidney disease, stage 3a: Secondary | ICD-10-CM | POA: Diagnosis not present

## 2023-10-25 DIAGNOSIS — E039 Hypothyroidism, unspecified: Secondary | ICD-10-CM | POA: Diagnosis not present

## 2023-10-25 DIAGNOSIS — L659 Nonscarring hair loss, unspecified: Secondary | ICD-10-CM | POA: Diagnosis not present

## 2023-11-01 NOTE — Telephone Encounter (Signed)
 Left message to call back.

## 2023-11-02 NOTE — Telephone Encounter (Signed)
Left message on machine for pt to contact the office.   

## 2023-11-04 NOTE — Telephone Encounter (Signed)
 Left message for patient to return call.

## 2023-11-07 ENCOUNTER — Ambulatory Visit: Admitting: Professional Counselor

## 2023-11-07 ENCOUNTER — Encounter: Payer: Self-pay | Admitting: Professional Counselor

## 2023-11-07 DIAGNOSIS — F314 Bipolar disorder, current episode depressed, severe, without psychotic features: Secondary | ICD-10-CM

## 2023-11-07 DIAGNOSIS — F3130 Bipolar disorder, current episode depressed, mild or moderate severity, unspecified: Secondary | ICD-10-CM

## 2023-11-07 DIAGNOSIS — F411 Generalized anxiety disorder: Secondary | ICD-10-CM
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
## 2023-11-08 DIAGNOSIS — M25561 Pain in right knee: Secondary | ICD-10-CM | POA: Diagnosis not present

## 2023-11-08 DIAGNOSIS — M25571 Pain in right ankle and joints of right foot: Secondary | ICD-10-CM | POA: Diagnosis not present

## 2023-11-08 DIAGNOSIS — G8929 Other chronic pain: Secondary | ICD-10-CM | POA: Diagnosis not present

## 2023-11-08 NOTE — Telephone Encounter (Signed)
 Called - left VM requesting call back. As we have tried to reach her 4x without success, will remove from triage pool.   Jahnasia Tatum S Burtis Imhoff, NP

## 2023-11-10 ENCOUNTER — Telehealth: Payer: Self-pay

## 2023-11-10 NOTE — Telephone Encounter (Signed)
 Patient called and LVM stating that she lost her papers for her massage and needs the number to call and schedule the massage.  This nurse reached out to [facility] and she called patient as well and LVM. She also stated an email was sent to the patient in May with the phone number.  Tried to reach patient in regards to VM and relayed information from above. Asked to return call with any questions.

## 2023-11-15 ENCOUNTER — Ambulatory Visit: Admitting: Behavioral Health

## 2023-11-17 ENCOUNTER — Ambulatory Visit: Admitting: Professional Counselor

## 2023-11-17 ENCOUNTER — Telehealth: Payer: Self-pay | Admitting: Behavioral Health

## 2023-11-17 ENCOUNTER — Encounter: Payer: Self-pay | Admitting: Professional Counselor

## 2023-11-17 DIAGNOSIS — F4312 Post-traumatic stress disorder, chronic: Secondary | ICD-10-CM

## 2023-11-17 DIAGNOSIS — F411 Generalized anxiety disorder: Secondary | ICD-10-CM

## 2023-11-17 DIAGNOSIS — F3130 Bipolar disorder, current episode depressed, mild or moderate severity, unspecified: Secondary | ICD-10-CM
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
## 2023-11-17 NOTE — Telephone Encounter (Signed)
 Per Redell, please call patient regarding her request about ECT. She is wanting some questions answered about it and also you can give her these places ( not local) for ECT. [facility]  [phone] and [facility] E[facility] in [place], [place] [phone]. [facility] in [place] has been unreliable but that might be an option.  [phone].

## 2023-11-18 NOTE — Telephone Encounter (Signed)
 LVM to [facility]. She is difficult to reach.

## 2023-11-20 DIAGNOSIS — I5032 Chronic diastolic (congestive) heart failure: Secondary | ICD-10-CM | POA: Diagnosis not present

## 2023-11-20 DIAGNOSIS — N183 Chronic kidney disease, stage 3 unspecified: Secondary | ICD-10-CM | POA: Diagnosis not present

## 2023-11-20 DIAGNOSIS — E039 Hypothyroidism, unspecified: Secondary | ICD-10-CM | POA: Diagnosis not present

## 2023-11-23 NOTE — Telephone Encounter (Signed)
 Left second VM to [facility]

## 2023-11-23 NOTE — Telephone Encounter (Signed)
 Pt lvm 4:54 pm requesting Dawna to return call. Not sure what you are needing in previuos message 8/29.  Pt [phone]

## 2023-11-23 NOTE — Telephone Encounter (Signed)
 LVM to [facility]

## 2023-11-25 ENCOUNTER — Encounter: Payer: Self-pay | Admitting: Behavioral Health

## 2023-11-25 ENCOUNTER — Ambulatory Visit: Admitting: Behavioral Health

## 2023-11-25 DIAGNOSIS — F314 Bipolar disorder, current episode depressed, severe, without psychotic features: Secondary | ICD-10-CM

## 2023-11-25 DIAGNOSIS — F411 Generalized anxiety disorder: Secondary | ICD-10-CM

## 2023-11-25 DIAGNOSIS — F902 Attention-deficit hyperactivity disorder, combined type: Secondary | ICD-10-CM

## 2023-11-25 DIAGNOSIS — F4310 Post-traumatic stress disorder, unspecified: Secondary | ICD-10-CM

## 2023-11-25 NOTE — Progress Notes (Signed)
 [facility]  Patient ID: [name],  MRN: [id number]  PCP: Okey Carlin Redbird, MD  Date of Evaluation: 11/25/2023 Time spent:30 minutes  Chief Complaint:  Chief Complaint   Depression; Anxiety; Follow-up; Medication Problem; Patient Education; Stress     HISTORY/CURRENT STATUS: HPI  [name], 78 year old female presents to this office for for follow up and to establish care.  Collateral information should be considered reliable. Pt understands that she has reached medication max. Over the years had very limited progress. She is very frustrated that she has not been able to find any relief from severe bipolar depression. She is here today to discuss consideration of ECT. She is requesting a referral for consult. Recently had unsuccessful trial of Auvelity due to increased BP. Understands not a candidate for Sprivato due to Bipolar dx.  She does acknowledge untreated sleep apnea.  She rates her depression today at 7/10, and anxiety at 6/10.    Is sleeping 7-8 hours per night. Worsening appetite. Verbally contracts for safety with this Clinical research associate and currently feels safe.  Lives alone in [place] .  Is retired and not working.  Has a history of past hospitalizations and presentation to [facility] and ER.  Last ER visit was 09/22/2020 for SI.    Past medication trials: Zoloft Prozac  Celexa  Lexapro  Wellbutrin  Effexor Cymbalta  Amitriptyline Trintellix  Abilify  Seroquel  Vraylar Risperidone Geodon Lithium Depakote  Lamictal  Trileptal Xanax Valium  Ativan  Klonopin  Propranolol Hydroxyzine  BuSpar  Gabapentin Trazodone  Adderall Ritalin  Pristiq  Rexulti  Compazine Latuda   Reported Allergies: Geodon [ziprasidone hydrochloride], Lithium, Talwin [pentazocine], Toradol [ketorolac tromethamine], Abilify  [aripiprazole ], Compazine [prochlorperazine edisylate], Latuda  [lurasidone  hcl], Pristiq  [desvenlafaxine  succinate er], Rexulti  [brexpiprazole ], and Diflucan  [fluconazole]       Individual Medical History/ Review of Systems: Changes? :No   Allergies: Geodon [ziprasidone hydrochloride], Lithium, Talwin [pentazocine], Toradol [ketorolac tromethamine], Abilify  [aripiprazole ], Compazine [prochlorperazine edisylate], Latuda  [lurasidone  hcl], Pristiq  [desvenlafaxine  succinate er], Rexulti  [brexpiprazole ], and Diflucan [fluconazole]  Current Medications:  Current Outpatient Medications:    amLODipine  (NORVASC ) 5 MG tablet, Take 1 tablet (5 mg total) by mouth daily., Disp: 90 tablet, Rfl: 3   B Complex Vitamins (B COMPLEX PO), Take 1 mL by mouth daily., Disp: , Rfl:    DULoxetine  (CYMBALTA ) 30 MG capsule, Take 30 mg by mouth daily., Disp: , Rfl:    DULoxetine  (CYMBALTA ) 60 MG capsule, Take 1 capsule (60 mg total) by mouth daily., Disp: 30 capsule, Rfl: 3   fluticasone (FLONASE) 50 MCG/ACT nasal spray, Place 1 spray into both nostrils in the morning., Disp: , Rfl:    hydrALAZINE  (APRESOLINE ) 25 MG tablet, May take up to twice daily for SBP greater than 180, Disp: 270 tablet, Rfl: 3   hydrOXYzine  (ATARAX ) 25 MG tablet, Take 1 tablet (25 mg total) by mouth 3 (three) times daily as needed. (Patient not taking: Reported on 08/18/2023), Disp: 30 tablet, Rfl: 0   levothyroxine  (SYNTHROID ) 88 MCG tablet, Take 88 mcg by mouth daily before breakfast., Disp: , Rfl:    Lidocaine  HCl (ASPERCREME LIDOCAINE ) 4 % CREA, Apply 1 application  topically 2 (two) times daily as needed (for pain)., Disp: , Rfl:    LORazepam  (ATIVAN ) 1 MG tablet, Take 1 tablet (1 mg total) by mouth daily as needed., Disp: 30 tablet, Rfl: 3   losartan -hydrochlorothiazide  (HYZAAR) 50-12.5 MG tablet, TAKE 1 TABLET BY MOUTH TWICE A DAY, Disp: 90 tablet, Rfl: 1   MAGNESIUM  PO, Take 500 mg by mouth in the morning., Disp: , Rfl:    methylphenidate  (RITALIN ) 10 MG tablet,  Take 1 tablet (10 mg total) by mouth See admin instructions. Take 10 mg by mouth in the morning and at 2 PM- in conjunction with one 5  mg tablet to equal a total dose of 15 mg, Disp: 30 tablet, Rfl: 0   methylphenidate  (RITALIN ) 5 MG tablet, Take 1 tablet (5 mg total) by mouth See admin instructions. Take 5 mg by mouth in the morning and at 2 PM- in conjunction with one 10 mg tablet to equal a total dose of 15 mg, Disp: 30 tablet, Rfl: 0   metoprolol  tartrate (LOPRESSOR ) 25 MG tablet, Take 25 mg by mouth 2 (two) times daily. If HR is 60 or less pt holds the ( 25 mg) pm dose., Disp: , Rfl:    MINERAL ICE 2 % GEL, Apply 1 application  topically 2 (two) times daily as needed (for pain)., Disp: , Rfl:    Omega-3 Fatty Acids (FISH OIL PO), Take 1 capsule by mouth daily., Disp: , Rfl:    pantoprazole  (PROTONIX ) 40 MG tablet, Take 40 mg by mouth daily., Disp: , Rfl:    promethazine  (PHENERGAN ) 25 MG tablet, Take 25 mg by mouth as needed for nausea or vomiting., Disp: , Rfl:    rOPINIRole  (REQUIP ) 0.5 MG tablet, TAKE 1 TABLET BY MOUTH EVERY DAY AFTER SUPPER AND TAKE 2 TABLETS AT BEDTIME (Patient taking differently: Take 0.5-1 mg by mouth at bedtime.), Disp: 270 tablet, Rfl: 3   traMADol  (ULTRAM ) 50 MG tablet, Take 50 mg by mouth 2 (two) times daily as needed (for pain)., Disp: , Rfl:    TYLENOL  8 HOUR ARTHRITIS PAIN 650 MG CR tablet, Take 650 mg by mouth every 8 (eight) hours as needed for pain., Disp: , Rfl:  Medication Side Effects: none  Family Medical/ Social History: Changes? No  MENTAL HEALTH EXAM:  There were no vitals taken for this visit.There is no height or weight on file to calculate BMI.  General Appearance: Casual, Neat, and Well Groomed  Eye Contact:  Good  Speech:  Clear and Coherent  Volume:  Normal  Mood:  Anxious, Depressed, and Dysphoric  Affect:  Congruent, Depressed, Flat, and Anxious  Thought Process:  Coherent  Orientation:  Full (Time, Place, and Person)  Thought Content: Logical   Suicidal Thoughts:  No  Homicidal Thoughts:  No  Memory:  WNL  Judgement:  Fair  Insight:  Fair  Psychomotor Activity:   Normal  Concentration:  Concentration: Good  Recall:  Fair  Fund of Knowledge: Fair  Language: Good  Assets:  Desire for Improvement Physical Health Resilience Social Support  ADL's:  Intact  Cognition: WNL  Prognosis:  Fair    DIAGNOSES:    ICD-10-CM   1. Severe bipolar I disorder, current or most recent episode depressed (HCC)  F31.4     2. Attention deficit hyperactivity disorder (ADHD), combined type  F90.2     3. PTSD (post-traumatic stress disorder)  F43.10     4. Generalized anxiety disorder  F41.1       Receiving Psychotherapy: Yes    RECOMMENDATIONS:   Greater than 50% of 30 min face to face time with patient was spent on counseling and coordination of care. I reinforced that she was medication maxed and I am not comfortable in attempting further medication trials with this patient.  She is interested in ECT for the treatment of bipolar 1 disorder with most recent depressive episode. She suffer the majority of the time with severe depression. Not sure  if there may be borderline component to her dx. I am recommending a referral to [facility] for further consultation and ECT.  If unsuccessful, I will recommend further care to Geriatric Psychiatrist Elna Lo at [facility].  Will bridge her medications to 3 months until established.    We agreed today to:   To  continue Cymbalta  to 60 mg daily She will continue Ritalin  15 mg daily. Rx written separately as a 10 mg and 5 mg script. She will continue Ativan  1 mg daily as needed for severe anxiety Will report worsening symptoms promptly to follow-up in 8 weeks to reassess Provided emergency contact information such as 911, [facility], [facility] or [facility].  Discussed potential benefits, risk, and side effects of benzodiazepines to include potential risk of tolerance and dependence, as well as possible drowsiness.  Advised patient not to drive if experiencing drowsiness and to take lowest possible effective dose to  minimize risk of dependence and tolerance.  Reviewed PDMP    Redell DELENA Pizza, NP

## 2023-11-25 NOTE — Telephone Encounter (Signed)
 Was able to reach patient today and she has an appt with Redell and he can discuss information with her.

## 2023-11-28 DIAGNOSIS — R051 Acute cough: Secondary | ICD-10-CM | POA: Diagnosis not present

## 2023-11-28 DIAGNOSIS — Z03818 Encounter for observation for suspected exposure to other biological agents ruled out: Secondary | ICD-10-CM | POA: Diagnosis not present

## 2023-11-28 DIAGNOSIS — B349 Viral infection, unspecified: Secondary | ICD-10-CM | POA: Diagnosis not present

## 2023-11-28 DIAGNOSIS — R0982 Postnasal drip: Secondary | ICD-10-CM | POA: Diagnosis not present

## 2023-11-28 DIAGNOSIS — R42 Dizziness and giddiness: Secondary | ICD-10-CM | POA: Diagnosis not present

## 2023-11-29 ENCOUNTER — Telehealth: Payer: Self-pay | Admitting: Behavioral Health

## 2023-11-29 NOTE — Telephone Encounter (Signed)
 Referral Form w/ notes sent to [facility] ECT Dept.

## 2023-11-30 ENCOUNTER — Other Ambulatory Visit: Payer: Self-pay

## 2023-11-30 DIAGNOSIS — F902 Attention-deficit hyperactivity disorder, combined type: Secondary | ICD-10-CM

## 2023-11-30 MED ORDER — METHYLPHENIDATE HCL 10 MG PO TABS: 10.0000 mg | ORAL_TABLET | ORAL | 0 refills | Status: AC

## 2023-11-30 MED ORDER — METHYLPHENIDATE HCL 5 MG PO TABS: 5.0000 mg | ORAL_TABLET | ORAL | 0 refills | Status: AC

## 2023-12-01 ENCOUNTER — Ambulatory Visit: Admitting: Professional Counselor

## 2023-12-02 ENCOUNTER — Telehealth: Payer: Self-pay | Admitting: Professional Counselor

## 2023-12-02 DIAGNOSIS — J0110 Acute frontal sinusitis, unspecified: Secondary | ICD-10-CM | POA: Diagnosis not present

## 2023-12-02 DIAGNOSIS — I10 Essential (primary) hypertension: Secondary | ICD-10-CM | POA: Diagnosis not present

## 2023-12-02 NOTE — Telephone Encounter (Signed)
 Pt will keep 9/18 apt
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
## 2023-12-06 ENCOUNTER — Ambulatory Visit: Admitting: Adult Health

## 2023-12-08 ENCOUNTER — Ambulatory Visit: Admitting: Professional Counselor

## 2023-12-08 ENCOUNTER — Encounter: Payer: Self-pay | Admitting: Professional Counselor

## 2023-12-08 DIAGNOSIS — F4312 Post-traumatic stress disorder, chronic: Secondary | ICD-10-CM

## 2023-12-08 DIAGNOSIS — F411 Generalized anxiety disorder: Secondary | ICD-10-CM

## 2023-12-08 DIAGNOSIS — F3130 Bipolar disorder, current episode depressed, mild or moderate severity, unspecified: Secondary | ICD-10-CM | POA: Diagnosis not present

## 2023-12-08 NOTE — Progress Notes (Signed)
      [facility] Counselor/Therapist Progress Note  Patient ID: [name], MRN: [id number],    Date: 12/08/2023  Time Spent: 11:13 AM to 12:10 PM  Treatment Type: Individual Therapy  Reported Symptoms: Tearfulness, worries, stress, interpersonal concerns, caregiver strain, sadness, anxiousness, low mood, low energy, fatigue, social isolation, self-esteem concerns, self-care concerns, phase of life concerns, sense of overwhelm  Mental Status Exam:  Appearance:   Neat     Behavior:  Appropriate and Sharing  Motor:  Normal  Speech/Language:   Clear and Coherent and Normal Rate  Affect:  Tearful  Mood:  sad and worried  Thought process:  normal  Thought content:    WNL  Sensory/Perceptual disturbances:    WNL  Orientation:  oriented to person, place, time/date, and situation  Attention:  Good  Concentration:  Good  Memory:  WNL  Fund of knowledge:   Good  Insight:    Good  Judgment:   Good  Impulse Control:  Good   Risk Assessment: Danger to Self:  No Self-injurious Behavior: No Danger to Others: No Duty to Warn:no Physical Aggression / Violence:No  Access to Firearms a concern: No  Gang Involvement:No   Subjective: Patient presented to session to address concerns of bipolar disorder with current depression, anxiety, and trauma response pattern.  Patient identified mixed progress at this time.  Counselor and patient discussed patient treatment plan and patient gave her consent.  Patient processed experience of her partner having fallen, and being in rehab and to not be improving as of yet.  She processed experience of stress around the circumstance, and trying to maintain her own self-care while trying to be of help to him.  She identified her sense of darkness as having improved, however to be fearful of upcoming fall and winter months when she says her depression is exacerbated.  Patient identified her living environment as frustrating her, with desire to clean and  organize.  Patient also processed trauma history including her upbringing and relationship with mother and father.  She identified trust in God as primary coping skill.  Counselor actively listened, affirmed patient feelings and experience, helped patient to resource coping skills and identify short-term goals, help patient to process trauma and relational dynamics.  Interventions: Solution-Oriented/Positive Psychology, Humanistic/Existential, Insight-Oriented, and Resourcing, Treatment Planning  Diagnosis:   ICD-10-CM   1. Bipolar I disorder, most recent episode (or current) depressed (HCC)  F31.30     2. Generalized anxiety disorder  F41.1     3. Post-traumatic stress disorder, chronic  F43.12       Plan: Patient is scheduled for follow-up; continue process work and developing coping skills.  STG between sessions for patient to continue efforts to connect with [name] support group and local senior center for additional support outlets, consider addressing home environment with external support and/or approaching by categories in small shifts, continue to prioritize self-care and limit over functioning for others as needed.  [name], [facility]

## 2023-12-09 ENCOUNTER — Telehealth: Payer: Self-pay | Admitting: Cardiology

## 2023-12-09 NOTE — Telephone Encounter (Signed)
 Reviewed with patient and scheduled follow up with Dr. Lonni 01/12/25.  She is aware of the recommendations and verbalizes understanding and agreement.

## 2023-12-09 NOTE — Telephone Encounter (Signed)
 Patient c/o Palpitations:  STAT if patient reporting lightheadedness, shortness of breath, or chest pain  How long have you had palpitations/irregular HR/ Afib? Are you having the symptoms now? Past week   Are you currently experiencing lightheadedness, SOB or CP? SOB  Do you have a history of afib (atrial fibrillation) or irregular heart rhythm? No  Have you checked your BP or HR? (document readings if available): 156/84 88hr  Are you experiencing any other symptoms? SOB

## 2023-12-09 NOTE — Telephone Encounter (Signed)
 Call transferred from operator. Pt reports heart pounding, fatigued, very short of breath.  Used to have a couple types of tachycardia.  Had ablation over 10 years ago.  Palpitations and pounding heartbeat are present at rest and worse with exertion.  Can't walk very far.  BP elevated 155/84 this morning before meds.  No energy.  HR 60-100.    Palpitations started yesterday. Pounding heart has been present for weeks.  Reports gets panic attacks that will cause heart pounding but never palps before.  Unsure if her anxiety is worse because of palps, or palps make anxiety worse.  Never getting readings that are less than 140/80, despite taking bp meds.  Confirmed her medications per the list.  She is still only taking metoprolol  25 mg daily instead of BID.  She did not recall the conversation at last ov with Dr. Lonni.   She has been holding the second dose due to HR being 60.  She has never seen it below 56.  I adv to take both daily doses of metoprolol .    She is concerned that BP stays high, HR races and skips and she's got so much fatigue.  Adv I will forward to APP for review and recommendations and we will call her back.

## 2023-12-09 NOTE — Telephone Encounter (Signed)
 Agree with recommendation for Metoprolol  Tartrate 25mg  BID and to only hold dose if HR <55 bpm.   Be sure to avoid caffeine, stay well hydrated.   Would recommend OV for EKG, BP eval with Dr. Lonni or APP. If she feels worse prior to that clinic visit, recommend evaluation at urgent care or ED.  Grainger Mccarley S Ransom Nickson, NP

## 2023-12-13 DIAGNOSIS — J329 Chronic sinusitis, unspecified: Secondary | ICD-10-CM | POA: Diagnosis not present

## 2023-12-20 DIAGNOSIS — E039 Hypothyroidism, unspecified: Secondary | ICD-10-CM | POA: Diagnosis not present

## 2023-12-20 DIAGNOSIS — N183 Chronic kidney disease, stage 3 unspecified: Secondary | ICD-10-CM | POA: Diagnosis not present

## 2023-12-20 DIAGNOSIS — I5032 Chronic diastolic (congestive) heart failure: Secondary | ICD-10-CM | POA: Diagnosis not present

## 2023-12-22 ENCOUNTER — Other Ambulatory Visit: Payer: Medicare Other

## 2023-12-22 ENCOUNTER — Ambulatory Visit: Payer: Medicare Other | Admitting: Physician Assistant

## 2024-01-03 ENCOUNTER — Telehealth: Payer: Self-pay | Admitting: Behavioral Health

## 2024-01-03 NOTE — Telephone Encounter (Signed)
 Pt lvm that she would like to go back on prozac  again. She said that this is a hard time of year  for her. She is not doing ECT. She also wants to talk to brian to tell him about [facility] and ECT. Please call her at [phone]

## 2024-01-04 ENCOUNTER — Ambulatory Visit (HOSPITAL_COMMUNITY)
Admission: EM | Admit: 2024-01-04 | Discharge: 2024-01-04 | Disposition: A | Discharge status: Home / Self Care | Attending: Psychiatry | Admitting: Psychiatry

## 2024-01-04 DIAGNOSIS — F332 Major depressive disorder, recurrent severe without psychotic features: Secondary | ICD-10-CM | POA: Insufficient documentation

## 2024-01-04 DIAGNOSIS — G4733 Obstructive sleep apnea (adult) (pediatric): Secondary | ICD-10-CM | POA: Diagnosis not present

## 2024-01-04 DIAGNOSIS — I2510 Atherosclerotic heart disease of native coronary artery without angina pectoris: Secondary | ICD-10-CM | POA: Insufficient documentation

## 2024-01-04 DIAGNOSIS — F4310 Post-traumatic stress disorder, unspecified: Secondary | ICD-10-CM | POA: Insufficient documentation

## 2024-01-04 DIAGNOSIS — Z85118 Personal history of other malignant neoplasm of bronchus and lung: Secondary | ICD-10-CM | POA: Insufficient documentation

## 2024-01-04 DIAGNOSIS — F411 Generalized anxiety disorder: Secondary | ICD-10-CM | POA: Insufficient documentation
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
## 2024-01-04 NOTE — Discharge Instructions (Addendum)

## 2024-01-04 NOTE — Discharge Summary (Signed)
 [name] to be discharged Home per NP order. Discussed with the patient and all questions fully answered. An After Visit Summary was printed and given to the patient. Patient escorted out and discharged home via private auto.  [name]  01/04/2024 6:48 PM

## 2024-01-05 ENCOUNTER — Telehealth: Payer: Self-pay | Admitting: Behavioral Health

## 2024-01-05 ENCOUNTER — Ambulatory Visit: Admitting: Professional Counselor

## 2024-01-05 ENCOUNTER — Encounter: Payer: Self-pay | Admitting: Professional Counselor

## 2024-01-05 DIAGNOSIS — F4312 Post-traumatic stress disorder, chronic: Secondary | ICD-10-CM | POA: Diagnosis not present

## 2024-01-05 DIAGNOSIS — F314 Bipolar disorder, current episode depressed, severe, without psychotic features: Secondary | ICD-10-CM | POA: Diagnosis not present

## 2024-01-05 DIAGNOSIS — F411 Generalized anxiety disorder: Secondary | ICD-10-CM | POA: Diagnosis not present

## 2024-01-05 NOTE — Telephone Encounter (Signed)
 Per patient request when seeing Cato Sprang today, she wants a referral sent to Dr. Tasia at [facility]. Referral prepared and sent to Megan at [facility].

## 2024-01-05 NOTE — Telephone Encounter (Signed)
 FYI: Pt went to [facility] yesterday. Was evaluated and discharged after being provided with resources for IOP/PHP. Called to FU with her this morning and did not get an answer, but she is difficult to get in touch with.

## 2024-01-05 NOTE — Progress Notes (Signed)
      [facility] Counselor/Therapist Progress Note  Patient ID: [name], MRN: [id number],    Date: 01/10/2024  Time Spent: 2:08 PM to 3:09 PM  Treatment Type: Individual Therapy  Reported Symptoms: Panic attacks, social isolation, vague SI no intent/plan, stress, abdominal distress, sadness, tearfulness, low mood, physiological distress, loneliness, anhedonia, fatigue, sleep concerns, trouble concentrating, restlessness, anxiousness, trouble relaxing  Mental Status Exam:  Appearance:   Neat     Behavior:  Appropriate and Sharing  Motor:  Normal  Speech/Language:   Clear and Coherent and Normal Rate  Affect:  Depressed and Tearful  Mood:  depressed and sad  Thought process:  normal  Thought content:    WNL  Sensory/Perceptual disturbances:    WNL  Orientation:  oriented to person, place, time/date, and situation  Attention:  Good  Concentration:  Good  Memory:  WNL  Fund of knowledge:   Good  Insight:    Good  Judgment:   Fair  Impulse Control:  Fair   Risk Assessment: Danger to Self:  Yes.  without intent/plan Self-injurious Behavior: No Danger to Others: No Duty to Warn:no Physical Aggression / Violence:No  Access to Firearms a concern: No  Gang Involvement:No   Subjective: Patient presented to session to address concerns of anxiety, depression, trauma response pattern.  She reported minimal progress at this time.  She reported having been in the ER the day prior and having been discharged however to still feel exacerbated symptomology.  She did not endorse desire to self-harm or end her life; she endorsed vague SI, with experience of sense of despair.  Patient identified seeking follow-up with [facility] for intensive outpatient program.  Patient identified her insurance not covering ECT, and prescribing providers recommendation that she consult with geriatric psychiatrist for which practice initiated a referral (Dr. Elna Bream).  Counselor actively  listened, affirmed patient feelings and experience, discussed safety plan with patient, and coping skills including as relates window of tolerance and relaxation techniques for which counselor provided resources for patient to take home.  Interventions: Solution-Oriented/Positive Psychology, Humanistic/Existential, Insight-Oriented, and Resourcing, Safety Planning  Diagnosis:   ICD-10-CM   1. Severe bipolar I disorder, current or most recent episode depressed (HCC)  F31.4     2. Generalized anxiety disorder  F41.1     3. Chronic post-traumatic stress disorder (PTSD)  F43.12       Plan: Patient is scheduled for follow-up; continue process work and developing coping skills.  Patient short-term goal between sessions to follow-up with psychiatric referral, continue to take medications as prescribed, observe safety plan and seek emergency health if needed, resource coping skills per materials provided.  [name], [facility]

## 2024-01-06 ENCOUNTER — Other Ambulatory Visit: Payer: Self-pay | Admitting: Pulmonary Disease

## 2024-01-06 DIAGNOSIS — G2581 Restless legs syndrome: Secondary | ICD-10-CM

## 2024-01-10 ENCOUNTER — Telehealth: Payer: Self-pay

## 2024-01-10 ENCOUNTER — Encounter: Payer: Self-pay | Admitting: Professional Counselor

## 2024-01-10 ENCOUNTER — Ambulatory Visit: Admitting: Professional Counselor

## 2024-01-10 DIAGNOSIS — F4312 Post-traumatic stress disorder, chronic: Secondary | ICD-10-CM

## 2024-01-10 DIAGNOSIS — F314 Bipolar disorder, current episode depressed, severe, without psychotic features: Secondary | ICD-10-CM

## 2024-01-10 DIAGNOSIS — F411 Generalized anxiety disorder: Secondary | ICD-10-CM

## 2024-01-10 NOTE — Progress Notes (Signed)
      [facility] Counselor/Therapist Progress Note  Patient ID: [name], MRN: [id number],    Date: 01/10/2024  Time Spent: 2:04 PM to 3:09 PM  Treatment Type: Individual Therapy  Reported Symptoms: Sadness, low mood, anhedonia, worries, anxiousness, tearfulness, trouble relaxing, restlessness, irritability, fatigue, appetite and sleep concerns, trouble concentrating, phase of life concerns  Mental Status Exam:  Appearance:   Neat     Behavior:  Appropriate and Sharing  Motor:  Normal  Speech/Language:   Clear and Coherent and Normal Rate  Affect:  Appropriate, Congruent, and Depressed  Mood:  depressed  Thought process:  normal  Thought content:    WNL  Sensory/Perceptual disturbances:    WNL  Orientation:  oriented to person, place, time/date, and situation  Attention:  Good  Concentration:  Good  Memory:  WNL  Fund of knowledge:   Good  Insight:    Good  Judgment:   Good  Impulse Control:  Good   Risk Assessment: Danger to Self:  No Self-injurious Behavior: No Danger to Others: No Duty to Warn:no Physical Aggression / Violence:No  Access to Firearms a concern: No  Gang Involvement:No   Subjective: Patient presented to session to address concerns of bipolar disorder current episode depressed, anxiety, and trauma response pattern.  Patient reported minimal progress at this time.  Patient processed experience of having spoken to referrals for higher level of care.  She reported [name] is recommending inpatient treatment in [place] .  Counselor and patient discussed local options, per patient's preference.  They discussed options for [place], the [place], [place], [place].  Counselor assisted in facilitating consultation with nurse on-call regarding patient sense that medication is not helping, and her desire for adjustment at earliest convenience.  Counselor and patient discussed patient group therapy options to enhance care at this time, and patient  identified to sometimes go to now what group for windows.  Counselor encouraged patient engagement with group and other supportive activities.  Counselor assisted patient in identifying healthy coping skills and short-term goals between session.  Interventions: Solution-Oriented/Positive Psychology, Humanistic/Existential, Insight-Oriented, and Resourcing, Referrals  Diagnosis:   ICD-10-CM   1. Severe bipolar I disorder, current or most recent episode depressed (HCC)  F31.4     2. Generalized anxiety disorder  F41.1     3. Chronic post-traumatic stress disorder (PTSD)  F43.12       Plan: Patient is scheduled for follow-up; continue process work and developing coping skills.  Short-term goal between sessions for patient to continue to assess choice for care, follow-up with nurse on-call and prescribing provider, consider increase in social engagement opportunities including through senior center.  Continue to prioritize self-care and safety plan as needed.  [name], [facility]

## 2024-01-10 NOTE — Telephone Encounter (Signed)
 Unfortunately pt has failed to respond or tolerate over 20 psych meds and TMS.  There is no medication intervention I can offer.  I agree with the referral for ECT which was made last month.  Best options at [facility].  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   Lorene Macintosh, MD, DFAPA

## 2024-01-10 NOTE — Telephone Encounter (Signed)
 [name]'s pt, first seen 08/18/23. She was referred to Dr. Tasia last week, still awaiting appt. She saw [name] today and she is asking for Prozac , reporting it is the only medication that worked for her. It looks like she has been off an on it for years. Notations from previous providers in Epic med history note noncompliance, insurance didn't cover, and pt preference. She had a recent evaluation at [facility], deemed to not need hospitalization.   Last seen by [name] 9/5: To  continue Cymbalta  to 60 mg daily She will continue Ritalin  15 mg daily. Rx written separately as a 10 mg and 5 mg script. She will continue Ativan  1 mg daily as needed for severe anxiety

## 2024-01-11 ENCOUNTER — Encounter (HOSPITAL_BASED_OUTPATIENT_CLINIC_OR_DEPARTMENT_OTHER): Payer: Self-pay

## 2024-01-11 NOTE — Telephone Encounter (Signed)
 Please see messages. I know you have referred her out, but I am unsure of the status.  ECT was denied by insurance.

## 2024-01-13 ENCOUNTER — Encounter (HOSPITAL_BASED_OUTPATIENT_CLINIC_OR_DEPARTMENT_OTHER): Payer: Self-pay | Admitting: Cardiology

## 2024-01-13 ENCOUNTER — Ambulatory Visit (HOSPITAL_BASED_OUTPATIENT_CLINIC_OR_DEPARTMENT_OTHER): Admitting: Cardiology

## 2024-01-13 VITALS — BP 130/72 | HR 68 | Ht 64.0 in | Wt 229.6 lb

## 2024-01-13 DIAGNOSIS — I2510 Atherosclerotic heart disease of native coronary artery without angina pectoris: Secondary | ICD-10-CM | POA: Diagnosis not present

## 2024-01-13 DIAGNOSIS — E7800 Pure hypercholesterolemia, unspecified: Secondary | ICD-10-CM

## 2024-01-13 DIAGNOSIS — R0989 Other specified symptoms and signs involving the circulatory and respiratory systems: Secondary | ICD-10-CM

## 2024-01-13 DIAGNOSIS — R002 Palpitations: Secondary | ICD-10-CM

## 2024-01-13 DIAGNOSIS — F419 Anxiety disorder, unspecified: Secondary | ICD-10-CM | POA: Diagnosis not present

## 2024-01-13 DIAGNOSIS — F322 Major depressive disorder, single episode, severe without psychotic features: Secondary | ICD-10-CM

## 2024-01-13 DIAGNOSIS — Z789 Other specified health status: Secondary | ICD-10-CM

## 2024-01-13 NOTE — Patient Instructions (Signed)
 Medication Instructions:  No change *If you need a refill on your cardiac medications before your next appointment, please call your pharmacy*  Lab Work: None If you have labs (blood work) drawn today and your tests are completely normal, you will receive your results only by: MyChart Message (if you have MyChart) OR A paper copy in the mail If you have any lab test that is abnormal or we need to change your treatment, we will call you to review the results.  Testing/Procedures: None  Follow-Up: At [facility], you and your health needs are our priority.  As part of our continuing mission to provide you with exceptional heart care, our providers are all part of one team.  This team includes your primary Cardiologist (physician) and Advanced Practice Providers or APPs (Physician Assistants and Nurse Practitioners) who all work together to provide you with the care you need, when you need it.  Your next appointment:   6 month(s)  Provider:   Shelda Bruckner, MD    We recommend signing up for the patient portal called MyChart.  Sign up information is provided on this After Visit Summary.  MyChart is used to connect with patients for Virtual Visits (Telemedicine).  Patients are able to view lab/test results, encounter notes, upcoming appointments, etc.  Non-urgent messages can be sent to your provider as well.   To learn more about what you can do with MyChart, go to [email or web address].   Other Instructions If you and your team decide that you want to try something besides metoprolol , just let us  know.

## 2024-01-13 NOTE — Progress Notes (Signed)
 Cardiology Office Note:  .   Date:  01/13/2024  ID:  [name], DOB [birth year], MRN [id number] PCP: Okey Carlin Redbird, MD  [facility] Providers Cardiologist:  Shelda Bruckner, MD {  History of Present Illness: .   [name] is a 78 y.o. female with a hx of PVCs, paroxysmal atrial tachycardia s/p ablation, hypertension, CAD, renal cell carcinoma s/p nephrectomy who is seen for follow up today. I initially met her 02/13/19 as a new patient to me/prior patient of Dr. Shlomo for the evaluation and management of her above issues.   Pertinent CV history: LHC 2018 with nonobstructive disease recommended for medical management. Myoview  07/2020 low risk study. Echo 09/2021 predominantly normal sinus rhythm with brief episodes of SVT. Echo November 2023 normal LVEF 55 to 60%, no RWMA, moderate LVH, normal PASP, bilateral atria mildly to moderately dilated, trivial MR, aortic sclerosis without stenosis.   Today: Called office 12/09/23 with palpitations and shortness of breath for a week. HR ranging 60-100 bpm. Blood pressure also elevated. She was taking metoprolol  only once/day, recommended to take BID and monitor symptoms.  Pounding/palpitations improved from when she called. No syncope. Occasionally has mild chest pressure with stress, nonlimiting.  She is struggling with a major episode of depression and anxiety. She is tearful in the office today. She feels very stressed being at home, and if she can convince herself to leave the house, she feels better. She notes that her blood pressure numbers at home are always worse than being in the office. She bought a new cuff so believes it should be accurate. She wonders if the stress of being at home is what makes her blood pressure worse.  She is struggling to take medications but is trying to take regularly.  Amlodipine : takes in the morning, misses about 2x/week Losartan -hydrochlorothiazide : never misses, takes 1 in the  morning and 1 in the afternoon Metoprolol  tartate: takes morning and early afternoon doses, misses about once/week Hydralazine : is PRN, has not required  We discussed that metoprolol  and beta blockers in general can block adrenaline, which can be helpful in anxiety, but there is data that beta blockers can sometimes worsen depression. We discussed options for managing palpitations/PVCs, see below.  She wants to improve her exercise level, as she feels easily fatigued and deconditioned.   ROS: Denies shortness of breath at rest or with normal exertion. No PND, orthopnea, LE edema or unexpected weight gain. No syncope. ROS otherwise negative except as noted.   Studies Reviewed: SABRA    EKG:  EKG Interpretation Date/Time:  Friday January 13 2024 12:17:56 EDT Ventricular Rate:  68 PR Interval:  120 QRS Duration:  88 QT Interval:  400 QTC Calculation: 425 R Axis:   48  Text Interpretation: Normal sinus rhythm Normal ECG When compared with ECG of 21-Jul-2023 16:21, PREVIOUS ECG IS PRESENT Confirmed by Bruckner Shelda [id number]) on 01/13/2024 12:21:48 PM    Physical Exam:   VS:  BP 130/72   Pulse 68   Ht 5' 4 (1.626 m)   Wt 229 lb 9.6 oz (104.1 kg)   SpO2 96%   BMI 39.41 kg/m    Wt Readings from Last 3 Encounters:  01/13/24 229 lb 9.6 oz (104.1 kg)  08/16/23 226 lb 9.6 oz (102.8 kg)  07/21/23 225 lb 8.5 oz (102.3 kg)    GEN: Well nourished, well developed in no acute distress HEENT: Normal, moist mucous membranes NECK: No JVD CARDIAC: regular rhythm, normal S1 and S2,  no rubs or gallops. No murmur. VASCULAR: Radial and DP pulses 2+ bilaterally. No carotid bruits RESPIRATORY:  Clear to auscultation without rales, wheezing or rhonchi  ABDOMEN: Soft, non-tender, non-distended MUSCULOSKELETAL:  Ambulates independently SKIN: Warm and dry, no edema NEUROLOGIC:  Alert and oriented x 3. No focal neuro deficits noted. PSYCHIATRIC:  Normal affect    ASSESSMENT AND PLAN: .     Palpitations, pounding heart beats Severe anxiety/depression -we discussed at length today the overlap with stress, anxiety/depression, how this affects manifestation of symptoms. -after shared decision making, will continue metoprolol  for now. If she and her team feel that metoprolol  may be contributing to depression, we can then change to diltiazem or verapamil  and monitor symptoms  Coronary disease by cath 2018, exercise intolerance -based on cath in 2018, would aim for medical management as much as tolerated. -nuclear stress test 2022 very reassuring, normal EF, no evidence of ischemia -recommended aspirin , statin in the past. See below   Hypercholesterolemia: LDL goal <70, last was 114 per [facility] on 08/01/23 -has not tolerated multiple statins in the past.  -we have discussed additional therapies, but with her extensive history of medication intolerances, she is hesitant to try any other medications   Hypertension: at goal in office today -continue amlodipine  5 mg daily -continue losartan -HCTZ 50-12.5 mg BID -continue metoprolol  tartrate 25 mg BID, hold only if HR <50. See above re: potential future options  CV risk counseling and prevention -recommend heart healthy/Mediterranean diet, with whole grains, fruits, vegetable, fish, lean meats, nuts, and olive oil. Limit salt. -recommend moderate walking, 3-5 times/week for 30-50 minutes each session. Aim for at least 150 minutes/week. Goal should be pace of 3 miles/hours, or walking 1.5 miles in 30 minutes -recommend avoidance of tobacco products. Avoid excess alcohol .  Dispo: 6 mos  Total time of encounter: I spent 41 minutes dedicated to the care of this patient on the date of this encounter to include pre-visit review of records, face-to-face time with the patient discussing conditions above, and clinical documentation with the electronic health record. We specifically spent time today discussing palpitations, anxiety/depression, how  these conditions affect symptoms and other organ systems   Signed, Shelda Bruckner, MD   Shelda Bruckner, MD, PhD, [facility]  [facility]    Heart & Vascular at [facility] at [facility] [address] [place], [place] [address] [phone]

## 2024-01-16 ENCOUNTER — Ambulatory Visit: Admitting: Professional Counselor

## 2024-01-16 ENCOUNTER — Encounter: Payer: Self-pay | Admitting: Professional Counselor

## 2024-01-16 DIAGNOSIS — F314 Bipolar disorder, current episode depressed, severe, without psychotic features: Secondary | ICD-10-CM | POA: Diagnosis not present

## 2024-01-16 DIAGNOSIS — M25511 Pain in right shoulder: Secondary | ICD-10-CM | POA: Diagnosis not present

## 2024-01-16 NOTE — Telephone Encounter (Signed)
 Per her it was denied. Probably need to check directly with insurance. But she needs to be informed at this point, there are no other medications we have to offer. She can continue with therapy, but she is med maxed.

## 2024-01-16 NOTE — Progress Notes (Signed)
      [facility] Counselor/Therapist Progress Note  Patient ID: [name], MRN: [id number],    Date: 01/16/2024  Time Spent: 11:17 AM to 12:14 PM  Treatment Type: Individual Therapy  Reported Symptoms: Nightmares, headaches, stress, trouble concentrating, anxiousness, sadness, anhedonia, low mood, financial concerns, phase of life concerns, health concerns, fatigue  Mental Status Exam:  Appearance:   Neat     Behavior:  Appropriate, Sharing, and Motivated  Motor:  Normal  Speech/Language:   Clear and Coherent and Normal Rate  Affect:  Appropriate and Congruent  Mood:  normal  Thought process:  normal  Thought content:    WNL  Sensory/Perceptual disturbances:    WNL  Orientation:  oriented to person, place, time/date, and situation  Attention:  Good  Concentration:  Good  Memory:  WNL  Fund of knowledge:   Good  Insight:    Good  Judgment:   Good  Impulse Control:  Good   Risk Assessment: Danger to Self:  No Self-injurious Behavior: No Danger to Others: No Duty to Warn:no Physical Aggression / Violence:No  Access to Firearms a concern: No  Gang Involvement:No   Subjective: Patient presented to session to address concerns of bipolar disorder current episode depressed.  She reported some progress at this time.  She reported feeling slightly better, having been a little better about self-care and cleaning up, and having been watching psychoeducation videos that have felt inspiring to her.  Counselor and patient discussed content including idea of free writing, dance and music therapy, body work and EMDR opportunities.  Counselor provided referral for patient for EMDR therapy should patient wish to pursue.  Counselor also assisted patient with resources for therapeutic outlets such as [facility] for free access to artistic exploration and yoga as a cancer survivor.  Patient processed experience of frustration regarding not hearing back from 1 intensive outpatient program,  and inability to do others discussed because of financial and other reasons.  Counselor encouraged patient following up with psychiatric referrals, and provided psychoeducation regarding window of tolerance, and discussed coping skills with patient.  Interventions: Solution-Oriented/Positive Psychology, Humanistic/Existential, Psycho-education/Bibliotherapy, Insight-Oriented, and Referrals, Resourcing, Sensorimotor Psychotherapy  Diagnosis:   ICD-10-CM   1. Severe bipolar I disorder, current or most recent episode depressed (HCC)  F31.4       Plan: Patient is scheduled for follow-up; continue process work and developing coping skills.  Patient short-term goal between sessions to pursue additional support opportunities as discussed in session per counselors resources, follow-up with psychiatric referral, practice coping skills for emotional regulation.  [name], [facility]

## 2024-01-19 ENCOUNTER — Ambulatory Visit: Admitting: Adult Health

## 2024-01-19 NOTE — Telephone Encounter (Signed)
 Pt said [facility] denied her ECT, but said they just called her, she didn't receive a letter. Redell is asking if referral can be sent to [facility]. He also said he is not going to see her again, but will bridge her meds. She is currently scheduled.

## 2024-01-23 ENCOUNTER — Ambulatory Visit: Admitting: Professional Counselor

## 2024-01-23 ENCOUNTER — Encounter: Payer: Self-pay | Admitting: Professional Counselor

## 2024-01-23 DIAGNOSIS — F411 Generalized anxiety disorder: Secondary | ICD-10-CM

## 2024-01-23 DIAGNOSIS — F4312 Post-traumatic stress disorder, chronic: Secondary | ICD-10-CM

## 2024-01-23 DIAGNOSIS — F314 Bipolar disorder, current episode depressed, severe, without psychotic features: Secondary | ICD-10-CM

## 2024-01-23 NOTE — Progress Notes (Signed)
      [facility] Counselor/Therapist Progress Note  Patient ID: [name], MRN: [id number],    Date: 01/23/2024  Time Spent: 11:13 AM to 12:20 PM  Treatment Type: Individual Therapy  Reported Symptoms: Low mood, worries, health concerns, phase of life concerns, anxiousness, irritability, trouble concentrating  Mental Status Exam:  Appearance:   Neat     Behavior:  Appropriate and Sharing  Motor:  Normal  Speech/Language:   Clear and Coherent and Normal Rate  Affect:  Tearful  Mood:  sad  Thought process:  normal  Thought content:    WNL  Sensory/Perceptual disturbances:    WNL  Orientation:  oriented to person, place, time/date, and situation  Attention:  Good  Concentration:  Good  Memory:  WNL  Fund of knowledge:   Good  Insight:    Good  Judgment:   Good  Impulse Control:  Good   Risk Assessment: Danger to Self:  No Self-injurious Behavior: No Danger to Others: No Duty to Warn:no Physical Aggression / Violence:No  Access to Firearms a concern: No  Gang Involvement:No   Subjective: Patient presented to session to address concerns of bipolar depression, anxiety and trauma response pattern.  Patient reported mixed progress at this time.  Patient identified some improvement in mood and attributed helpfulness to God, noting that God put a song in my heart.  Counselor reinforced patient spirituality resourcing, and counselor and patient discussed meaning of personal relationship to patient and to coping, growth and healing.  Patient identified desire to rely more on God.  Counselor helped to normalized patient struggles and helped to encourage cognitive restructuring around patient negative self appraisal of needing to be fixed/normal.  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  She voiced to be eating better.  Patient processed experience of family history including positive relationship with her grandmother, and strained relationship  with daughter and siblings.  Counselor and patient discussed patient psychiatric concerns and counselor encouraged follow-up with referrals.  Interventions: Solution-Oriented/Positive Psychology, Humanistic/Existential, and Insight-Oriented, Spiritually-Integrated Psychotherapy, CBT  Diagnosis:   ICD-10-CM   1. Severe bipolar I disorder, current or most recent episode depressed (HCC)  F31.4     2. Generalized anxiety disorder  F41.1     3. Chronic post-traumatic stress disorder (PTSD)  F43.12       Plan: Patient is scheduled for follow-up; continue process work and developing coping skills.  Patient short-term goal between sessions to continue jewelry making and resourcing of faith and spirituality as healthy coping mechanisms, practice positive self-talk, follow-up with psychiatric referrals.  [name], [facility]

## 2024-01-27 ENCOUNTER — Ambulatory Visit: Admitting: Behavioral Health

## 2024-01-30 ENCOUNTER — Ambulatory Visit: Admitting: Professional Counselor

## 2024-02-10 ENCOUNTER — Ambulatory Visit (INDEPENDENT_AMBULATORY_CARE_PROVIDER_SITE_OTHER): Payer: Self-pay | Admitting: Professional Counselor

## 2024-02-10 DIAGNOSIS — Z0389 Encounter for observation for other suspected diseases and conditions ruled out: Secondary | ICD-10-CM

## 2024-02-10 NOTE — Progress Notes (Signed)
 Patient did not show for scheduled appointment. No message.  [name], [facility]

## 2024-02-12 ENCOUNTER — Other Ambulatory Visit: Payer: Self-pay | Admitting: Pulmonary Disease

## 2024-02-12 DIAGNOSIS — G2581 Restless legs syndrome: Secondary | ICD-10-CM

## 2024-02-13 ENCOUNTER — Other Ambulatory Visit (HOSPITAL_BASED_OUTPATIENT_CLINIC_OR_DEPARTMENT_OTHER): Payer: Self-pay

## 2024-02-13 DIAGNOSIS — G2581 Restless legs syndrome: Secondary | ICD-10-CM

## 2024-02-13 MED ORDER — ROPINIROLE HCL 0.5 MG PO TABS: ORAL_TABLET | ORAL | 0 refills | Status: DC

## 2024-02-20 ENCOUNTER — Encounter: Payer: Self-pay | Admitting: Professional Counselor

## 2024-02-20 ENCOUNTER — Ambulatory Visit: Admitting: Professional Counselor

## 2024-02-20 DIAGNOSIS — F4312 Post-traumatic stress disorder, chronic: Secondary | ICD-10-CM

## 2024-02-20 DIAGNOSIS — F3130 Bipolar disorder, current episode depressed, mild or moderate severity, unspecified: Secondary | ICD-10-CM | POA: Diagnosis not present

## 2024-02-20 DIAGNOSIS — F411 Generalized anxiety disorder: Secondary | ICD-10-CM | POA: Diagnosis not present

## 2024-02-20 NOTE — Progress Notes (Unsigned)
"   °      [facility] Counselor/Therapist Progress Note  Patient ID: [name], MRN: [id number],    Date: 02/20/2024  Time Spent: 11:12 AM to 12:15 PM  Treatment Type: Individual Therapy  Reported Symptoms: panic attacks, worries, stress, phase of life concerns, low mood, anhedonia, irritability, frustration, traumatic memories and response pattern, fatigue, caregiver strain  Mental Status Exam:  Appearance:   Neat     Behavior:  Appropriate and Sharing  Motor:  Normal  Speech/Language:   Clear and Coherent and Normal Rate  Affect:  Appropriate, Congruent, and Tearful  Mood:  sad  Thought process:  normal  Thought content:    WNL  Sensory/Perceptual disturbances:    WNL  Orientation:  oriented to person, place, time/date, and situation  Attention:  Good  Concentration:  Good  Memory:  WNL  Fund of knowledge:   Good  Insight:    Good  Judgment:   Good  Impulse Control:  Good   Risk Assessment: Danger to Self:  No Self-injurious Behavior: No Danger to Others: No Duty to Warn:no Physical Aggression / Violence:No  Access to Firearms a concern: No  Gang Involvement:No   Subjective: Patient presented to session to address concerns of bipolar disorder current depression, and anxiety.  She processed the experience of her marital relationship trajectory, particularly her 25-year second marriage in which she experienced cancer surgeries and mental health setbacks, and difficulties in marital harmony.  She also processed experience of her first husband of 8 years.  Patient identified alcoholism of spouses as challenging for both marriages.  She continued to process experience of her relationship with her daughter and their history, particularly as relates to patient's granddaughter and early traumas.  Counselor actively listened, affirmed patient feelings and experience, helped to facilitate insight into patient process work and impact on her wellbeing across lifespan and spheres of  life.  Counselor helped to reinforce patient strengths and healthy coping skills.  Patient identified having her friend living with her since his discharge from rehabilitation facility, and for this to present challenges.  She identified feeling better after a shot in her knee for pain management, and slightly better than with her mental health symptomology in spite of continued navigation around psychiatric care; she reported receiving medication descriptions via PCP.  She identified pending referrals, for both psychiatry and EMDR, and to be looking into body work and massage supportive therapies.  Counselor recommended patient look into [employer] free creative and wellness offerings for cancer survivors.  Interventions: Solution-Oriented/Positive Psychology, Humanistic/Existential, Insight-Oriented, and Resourcing   Diagnosis:   ICD-10-CM   1. Bipolar I disorder, most recent episode (or current) depressed (HCC)  F31.30     2. Generalized anxiety disorder  F41.1     3. Chronic post-traumatic stress disorder (PTSD)  F43.12       Plan: Patient is scheduled for follow-up; continue process work and developing coping skills.  Patient short-term goals between sessions to continue to pursue referrals, consider positive coping outlets as discussed in session, prioritize self-care and limit caregiving over functioning.  [name], [facility]                   "

## 2024-02-27 ENCOUNTER — Encounter (HOSPITAL_BASED_OUTPATIENT_CLINIC_OR_DEPARTMENT_OTHER): Payer: Self-pay | Admitting: *Deleted

## 2024-02-27 ENCOUNTER — Ambulatory Visit: Admitting: Professional Counselor

## 2024-02-27 ENCOUNTER — Telehealth: Payer: Self-pay | Admitting: Cardiology

## 2024-02-27 NOTE — Telephone Encounter (Signed)
Called patient, mailbox full.

## 2024-02-27 NOTE — Telephone Encounter (Signed)
 Pt c/o BP issue: STAT if pt c/o blurred vision, one-sided weakness or slurred speech  1. What are your last 5 BP readings? 159/94 HR 96; 160/103; 144/86  2. Are you having any other symptoms (ex. Dizziness, headache, blurred vision, passed out)? Dizziness   3. What is your BP issue? Patient thinks is she anxiety attack

## 2024-03-06 ENCOUNTER — Ambulatory Visit: Admitting: Professional Counselor

## 2024-03-06 NOTE — Progress Notes (Signed)
 Patient did not show for scheduled appointment. No message.  [name], [facility]

## 2024-03-08 ENCOUNTER — Ambulatory Visit: Admitting: Adult Health

## 2024-03-22 ENCOUNTER — Other Ambulatory Visit (HOSPITAL_BASED_OUTPATIENT_CLINIC_OR_DEPARTMENT_OTHER): Payer: Self-pay | Admitting: Pulmonary Disease

## 2024-03-22 DIAGNOSIS — G2581 Restless legs syndrome: Secondary | ICD-10-CM

## 2024-03-23 NOTE — Telephone Encounter (Signed)
 Left pt message to notify of scheduling need

## 2024-03-23 NOTE — Telephone Encounter (Signed)
 Control refill

## 2024-03-27 ENCOUNTER — Telehealth: Payer: Self-pay

## 2024-03-27 DIAGNOSIS — I5032 Chronic diastolic (congestive) heart failure: Secondary | ICD-10-CM

## 2024-03-29 ENCOUNTER — Ambulatory Visit: Admitting: Professional Counselor

## 2024-04-02 ENCOUNTER — Encounter (HOSPITAL_BASED_OUTPATIENT_CLINIC_OR_DEPARTMENT_OTHER): Payer: Self-pay

## 2024-04-02 ENCOUNTER — Ambulatory Visit (INDEPENDENT_AMBULATORY_CARE_PROVIDER_SITE_OTHER)

## 2024-04-02 ENCOUNTER — Other Ambulatory Visit (HOSPITAL_BASED_OUTPATIENT_CLINIC_OR_DEPARTMENT_OTHER): Payer: Self-pay

## 2024-04-02 ENCOUNTER — Telehealth: Payer: Self-pay | Admitting: *Deleted

## 2024-04-02 VITALS — BP 140/82 | HR 82 | Ht 64.0 in | Wt 227.0 lb

## 2024-04-02 DIAGNOSIS — G4733 Obstructive sleep apnea (adult) (pediatric): Secondary | ICD-10-CM | POA: Diagnosis not present

## 2024-04-02 DIAGNOSIS — G2581 Restless legs syndrome: Secondary | ICD-10-CM | POA: Diagnosis not present

## 2024-04-02 NOTE — Telephone Encounter (Signed)
 RX sent x 1 refill

## 2024-04-02 NOTE — Progress Notes (Signed)
 Epworth Sleepiness Scale  Use the following scale to choose the most appropriate number for each situation. 0 Would never nod off 1  Slight  chance of nodding off 2 Moderate chance of nodding off 3 High chance of nodding off  Sitting and reading: 0 Watching TV: 0 Sitting, inactive, in a public place (e.g., in a meeting, theater, or dinner event): 1 As a passenger in a car for an hour or more without stopping for a break: 3 Lying down to rest when circumstances permit:3 Sitting and talking to someone: 0 Sitting quietly after a meal without alcohol : 0 In a car, while stopped for a few  minutes in traffic or at a light: 0  TOTOAL: 7

## 2024-04-02 NOTE — Progress Notes (Unsigned)
 Complex Care Management Note Care Guide Note  04/02/2024 Name: [name] MRN: [id number] DOB: 1946-03-12   Complex Care Management Outreach Attempts: An unsuccessful telephone outreach was attempted today to offer the patient information about available complex care management services.  Follow Up Plan:  Additional outreach attempts will be made to offer the patient complex care management information and services.   Encounter Outcome:  No Answer  Harlene Satterfield  [facility]  [facility], [facility] Guide  Direct Dial: [phone]  Fax [phone]

## 2024-04-02 NOTE — Progress Notes (Signed)
 "  @Patient  ID: [name], female    DOB: January 21, 1946, 79 y.o.   MRN: [id number]  Chief Complaint  Patient presents with   Establish Care    Referring provider: Okey Carlin Redbird, MD  HPI: Discussed the use of AI scribe software for clinical note transcription with the patient, who gave verbal consent to proceed.  History of Present Illness [name] is a 79 year old female with restless leg syndrome and sleep apnea who presents for a refill of ropinirole  and to discuss restarting CPAP therapy. She was referred by her psychiatrist to discuss restarting CPAP therapy due to poor sleep impacting her depression.  She needs a refill of ropinirole  for her restless leg syndrome. She currently takes Requip  for this condition, which continues to be an issue.  She is experiencing significant sleep disturbances and is considering restarting CPAP therapy. She has a history of moderate sleep apnea diagnosed in 2018 and has previously used CPAP and a mouthpiece without significant improvement in her symptoms. Her sleep has been particularly poor over the past year and two months, contributing to worsening depression, fatigue, and pain. She experiences daytime sleepiness and 'brain fog'.  Her psychiatrist suggested revisiting CPAP therapy as her sleep quality is crucial for managing her depression. She has tried different CPAP masks in the past but did not find them significantly beneficial, although she did sleep more through the night.  She has previously been considered for the [facility] device but was not a candidate at that time due to BMI of 40.  Last OV 02/26/2021: 79 year old female never smoker followed with restless leg syndrome and obstructive sleep apnea-CPAP intolerant  Medical history significant for Bipolar depression and Anxiety   02/26/2021 Follow up : RLS , OSA , Chronic cough  Patient returns for a 40-month follow-up.  Patient is followed for restless leg syndrome.  She  remains on Requip .  Says overall feels that this is under control.  She does have  mild OSA.  Has been intolerant to CPAP in the past. Has tried CPAP on 2 separate occasions but unable to tolerate.  Tried oral appliance in past but did not tolerate. Discussed INspire device in past but BMI is 40.  Long discussion regarding CPAP , oral appliance and Inspire. She has been unsucessfull in past and is not candidate for Inspire due to BMI    Last visit patient was having ongoing cough and shortness of breath.  She been treated with several courses of antibiotics and steroids for acute bronchitis.  Chest x-ray Jul 22, 2020 showed clear lungs. Last visit patient was recommended to begin Zyrtec daily.  To use Delsym and Tessalon  for cough control.  She was started on PPI which she feels really helped her cough. Since last visit patient is feeling much better and cough has resolved. TEST/EVENTS :  NPSG 2004:  AHI 10/hr PSG 07/2015 (214 lbs)  AHI 23/h, mild PLMs 23/h but no sig arousals   CPAP titration 09/2015 9 cm   HST 11/2016 AHI 12/h   Chest x-ray Jul 22, 2020 showed clear lungs  Allergies[1]  Immunization History  Administered Date(s) Administered   INFLUENZA, HIGH DOSE SEASONAL PF 11/15/2017, 05/26/2018, 01/19/2019, 12/03/2020   Influenza Whole 12/21/2007, 03/22/2009   Influenza,inj,Quad PF,6+ Mos 12/23/2010, 01/20/2013, 05/22/2015, 11/29/2016   Influenza-Unspecified 12/13/2012, 12/20/2013, 12/04/2015   PFIZER(Purple Top)SARS-COV-2 Vaccination 07/02/2019, 07/23/2019   Pneumococcal Conjugate-13 10/15/2015   Pneumococcal Polysaccharide-23 03/04/2004, 04/21/2013, 08/01/2019   Tdap 01/23/2010   Zoster, Live  03/02/2012, 12/20/2012    Past Medical History:  Diagnosis Date   Abscess of Bartholin's gland    Acquired hypothyroidism 1990   after partial thyroidectomy for thyroid  adenoma   Acute vestibular neuronitis    ADHD (attention deficit hyperactivity disorder)    Adverse effect of general  anesthetic    felt paralyzed while receiving anesthesia   Arthritis    bilateral shoulders, knees and hips   Bursitis of right shoulder 05/24/2019   Right shoulder subacromial injection   CAD (coronary artery disease)    cath 05/2016 showing 50-70% stenosis in the mid LAD proximal to the first diagonal and 70-80% small OM1. FFR of LAD  not performed because of difficulty with catheter control from the right radial.    Chronic cough 02/22/2020   Chronic kidney disease    hx of kidney cancer   Diastolic dysfunction, left ventricle 05/31/2013   Difficult intubation    told by [facility] that she was hard to intubate 15 yrs ago in [place]- surgery since then no problems   Dyspnea    Essential hypertension 05/31/2013   Fibromyalgia    Generalized anxiety disorder    Adequate for discharge    GERD (gastroesophageal reflux disease)    History of attention deficit disorder    History of echocardiogram 07/2012   Normal LVF w grade I siastolic dysfunction    History of endometriosis    History of gall stones 12/31/2009   History of pleural effusion    Hyperlipidemia 06/24/2016   LDL goal <70   Hypertension    Hypothyroidism 1990   after partial thyroidectomy for thyroid  adenoma   Major depressive disorder    Obesity    Obstructive sleep apnea 02/20/2007   NPSG 2004:  AHI 10/hr Failed cpap trials.>dental appliance   Split night 06/2015 >Moderate obstructive sleep apnea occurred during this study  (AHI = 22.7/h).>rec CPAP >> poor compliance  HST 11/2016 AHI 12/h, 7h TST  03/2018 -office visit with Dr. Jude discussed inspire device, patient would need to lose weight   PAT (paroxysmal atrial tachycardia)    s/p ablation   PONV (postoperative nausea and vomiting)    Pre-diabetes    PTSD (post-traumatic stress disorder)    Pure hypercholesterolemia 02/18/2019   PVC's (premature ventricular contractions)    Renal mass 02/15/2012   Restless legs syndrome (RLS) 02/20/2007   Vertigo     Tobacco  History: Tobacco Use History[2] Counseling given: Not Answered   Outpatient Medications Prior to Visit  Medication Sig Dispense Refill   amLODipine  (NORVASC ) 5 MG tablet Take 1 tablet (5 mg total) by mouth daily. 90 tablet 3   B Complex Vitamins (B COMPLEX PO) Take 1 mL by mouth daily.     cholecalciferol  (VITAMIN D3) 25 MCG (1000 UNIT) tablet Take 2,000 Units by mouth.     fluticasone (FLONASE) 50 MCG/ACT nasal spray Place 1 spray into both nostrils in the morning.     hydrALAZINE  (APRESOLINE ) 25 MG tablet May take up to twice daily for SBP greater than 180 270 tablet 3   hydrOXYzine  (ATARAX ) 25 MG tablet Take 1 tablet (25 mg total) by mouth 3 (three) times daily as needed. 30 tablet 0   levothyroxine  (SYNTHROID ) 88 MCG tablet Take 88 mcg by mouth daily before breakfast.     Lidocaine  HCl (ASPERCREME LIDOCAINE ) 4 % CREA Apply 1 application  topically 2 (two) times daily as needed (for pain).     LORazepam  (ATIVAN ) 1 MG tablet Take 1  tablet (1 mg total) by mouth daily as needed. 30 tablet 3   losartan -hydrochlorothiazide  (HYZAAR) 50-12.5 MG tablet TAKE 1 TABLET BY MOUTH TWICE A DAY 90 tablet 1   magnesium  gluconate (MAGONATE) 500 MG tablet Take 500 mg by mouth.     MAGNESIUM  PO Take 500 mg by mouth in the morning.     methylphenidate  (RITALIN ) 10 MG tablet Take 1 tablet (10 mg total) by mouth See admin instructions. Take 10 mg by mouth in the morning and at 2 PM- in conjunction with one 5 mg tablet to equal a total dose of 15 mg 30 tablet 0   methylphenidate  (RITALIN ) 5 MG tablet Take 1 tablet (5 mg total) by mouth See admin instructions. Take 5 mg by mouth in the morning and at 2 PM- in conjunction with one 10 mg tablet to equal a total dose of 15 mg 30 tablet 0   metoprolol  tartrate (LOPRESSOR ) 25 MG tablet Take 25 mg by mouth 2 (two) times daily. If HR is 60 or less pt holds the ( 25 mg) pm dose.     MINERAL ICE 2 % GEL Apply 1 application  topically 2 (two) times daily as needed (for pain).      Omega-3 Fatty Acids (FISH OIL PO) Take 1 capsule by mouth daily.     ondansetron  (ZOFRAN ) 8 MG tablet 1 tablet.     pantoprazole  (PROTONIX ) 40 MG tablet Take 40 mg by mouth daily.     promethazine  (PHENERGAN ) 25 MG tablet Take 25 mg by mouth as needed for nausea or vomiting.     rOPINIRole  (REQUIP ) 0.5 MG tablet TAKE 1 TABLET BY MOUTH EVERY DAY AFTER SUPPER AND TAKE 2 TABLETS AT BEDTIME 90 tablet 0   tiZANidine  (ZANAFLEX ) 2 MG tablet 1 tablet.     traMADol  (ULTRAM ) 50 MG tablet Take 50 mg by mouth 2 (two) times daily as needed (for pain).     TYLENOL  8 HOUR ARTHRITIS PAIN 650 MG CR tablet Take 650 mg by mouth every 8 (eight) hours as needed for pain.     DULoxetine  (CYMBALTA ) 30 MG capsule Take 30 mg by mouth daily.     DULoxetine  (CYMBALTA ) 60 MG capsule Take 1 capsule (60 mg total) by mouth daily. 30 capsule 3   No facility-administered medications prior to visit.     Review of Systems: as per hpi  Constitutional:   No  weight loss, night sweats,  Fevers, chills, fatigue, or  lassitude.  HEENT:   No headaches,  Difficulty swallowing,  Tooth/dental problems, or  Sore throat,                No sneezing, itching, ear ache, nasal congestion, post nasal drip,   CV:  No chest pain,  Orthopnea, PND, swelling in lower extremities, anasarca, dizziness, palpitations, syncope.   GI  No heartburn, indigestion, abdominal pain, nausea, vomiting, diarrhea, change in bowel habits, loss of appetite, bloody stools.   Resp: No shortness of breath with exertion or at rest.  No excess mucus, no productive cough,  No non-productive cough,  No coughing up of blood.  No change in color of mucus.  No wheezing.  No chest wall deformity  Skin: no rash or lesions.  GU: no dysuria, change in color of urine, no urgency or frequency.  No flank pain, no hematuria   MS:  No joint pain or swelling.  No decreased range of motion.  No back pain.    Physical Exam  BP (!) 140/82   Pulse 82   Ht 5' 4 (1.626  m)   Wt 227 lb (103 kg)   SpO2 100%   BMI 38.96 kg/m   GEN: A/Ox3; pleasant , NAD, well nourished    HEENT:  [place]/AT,  EACs-clear, TMs-wnl, NOSE-clear, THROAT-clear, no lesions, no postnasal drip or exudate noted. Mallampati 4  NECK:  Supple w/ fair ROM; no JVD; normal carotid impulses w/o bruits; no thyromegaly or nodules palpated; no lymphadenopathy.    RESP  Clear  P & A; w/o, wheezes/ rales/ or rhonchi. no accessory muscle use, no dullness to percussion  CARD:  RRR, no m/r/g, no peripheral edema, pulses intact, no cyanosis or clubbing.  GI:   Soft & nt; nml bowel sounds; no organomegaly or masses detected.   Musco: Warm bil, no deformities or joint swelling noted.   Neuro: alert, no focal deficits noted.    Skin: Warm, no lesions or rashes    Lab Results:  CBC    Component Value Date/Time   WBC 5.3 07/21/2023 1756   RBC 4.45 07/21/2023 1756   HGB 12.2 07/21/2023 1756   HGB 13.4 07/13/2023 0922   HGB 12.5 10/20/2021 1613   HGB 13.6 02/18/2015 1440   HCT 37.0 07/21/2023 1756   HCT 38.3 10/20/2021 1613   HCT 42.2 02/18/2015 1440   PLT 235 07/21/2023 1756   PLT 255 07/13/2023 0922   PLT 228 10/20/2021 1613   MCV 83.1 07/21/2023 1756   MCV 84 10/20/2021 1613   MCV 84.1 02/18/2015 1440   MCH 27.4 07/21/2023 1756   MCHC 33.0 07/21/2023 1756   RDW 14.0 07/21/2023 1756   RDW 13.6 10/20/2021 1613   RDW 14.3 02/18/2015 1440   LYMPHSABS 1.7 07/21/2023 1756   LYMPHSABS 1.5 02/18/2015 1440   MONOABS 0.4 07/21/2023 1756   MONOABS 0.4 02/18/2015 1440   EOSABS 0.2 07/21/2023 1756   EOSABS 0.1 02/18/2015 1440   BASOSABS 0.0 07/21/2023 1756   BASOSABS 0.0 02/18/2015 1440    BMET    Component Value Date/Time   NA 137 07/21/2023 1632   NA 139 10/20/2021 1613   NA 139 02/18/2015 1440   K 4.7 07/21/2023 1632   K 4.2 02/18/2015 1440   CL 102 07/21/2023 1632   CL 103 07/21/2012 1415   CO2 23 07/21/2023 1632   CO2 29 02/18/2015 1440   GLUCOSE 108 (H) 07/21/2023 1632    GLUCOSE 73 02/18/2015 1440   GLUCOSE 106 (H) 07/21/2012 1415   BUN 20 07/21/2023 1632   BUN 14 10/20/2021 1613   BUN 19.1 02/18/2015 1440   CREATININE 1.15 (H) 07/21/2023 1632   CREATININE 1.07 (H) 07/13/2023 0922   CREATININE 0.9 02/18/2015 1440   CALCIUM  10.1 07/21/2023 1632   CALCIUM  10.2 02/18/2015 1440   GFRNONAA 49 (L) 07/21/2023 1632   GFRNONAA 53 (L) 07/13/2023 0922   GFRAA >60 08/03/2019 0456    BNP    Component Value Date/Time   BNP 1,009.1 (H) 09/04/2020 1744    ProBNP    Component Value Date/Time   PROBNP 121.0 07/21/2023 1632   PROBNP <30.0 09/21/2009 0125    Imaging: No results found.  Administration History     None           No data to display          No results found for: NITRICOXIDE   Assessment & Plan:   Assessment & Plan OSA (obstructive sleep apnea)  Assessment and Plan Assessment &  Plan Obstructive sleep apnea Moderate obstructive sleep apnea diagnosed in 2018. Previous CPAP and mouthpiece trials ineffective. Considering CPAP re-evaluation due to worsening symptoms in the setting of depression. Current BMI under 40 may qualify her for Inspire device re-evaluation. - Ordered home sleep study to reassess severity. - Will discuss treatment options post-sleep study, including potential CPAP re-trial and Inspire device candidacy.  Restless legs syndrome Chronic restless legs syndrome managed with ropinirole . Reports ongoing symptoms. - Refilled ropinirole  prescription at pharmacy.    Return in about 7 weeks (around 05/21/2024) for sleep study review.  Candis Dandy, PA-C 04/02/2024      [1]  Allergies Allergen Reactions   Geodon [Ziprasidone Hydrochloride] Other (See Comments)    Extremely agitated   Lithium Nausea Only and Other (See Comments)    Off balance, increased heart rate   Talwin [Pentazocine] Other (See Comments)    Hallucinations    Toradol [Ketorolac Tromethamine] Other (See Comments)    Chest pains    Abilify  [Aripiprazole ] Other (See Comments)    jerking   Compazine [Prochlorperazine Edisylate] Nausea And Vomiting   Latuda  [Lurasidone  Hcl] Other (See Comments)    Reports made her mind race more and made her irritable    Pristiq  [Desvenlafaxine  Succinate Er] Other (See Comments)    Did not work, prefers not to take   Rexulti  [Brexpiprazole ] Other (See Comments) and Hypertension    Elevated BP, created aggression   Diflucan [Fluconazole] Nausea And Vomiting  [2]  Social History Tobacco Use  Smoking Status Never  Smokeless Tobacco Never   "

## 2024-04-02 NOTE — Patient Instructions (Addendum)
 Complete sleep study; ordered today.  Follow up in 6-8 weeks to review sleep study results.  Continue Requip ; new Rx sent to Pharmacy

## 2024-04-04 ENCOUNTER — Ambulatory Visit: Admitting: Professional Counselor

## 2024-04-04 NOTE — Progress Notes (Signed)
 Complex Care Management Note Care Guide Note  04/04/2024 Name: [name] MRN: [id number] DOB: [birth year]   Complex Care Management Outreach Attempts: A second unsuccessful outreach was attempted today to offer the patient with information about available complex care management services.  Follow Up Plan:  Additional outreach attempts will be made to offer the patient complex care management information and services.   Encounter Outcome:  No Answer  Harlene Satterfield  [facility]  [facility], [facility] Guide  Direct Dial: [phone]  Fax [phone]

## 2024-04-05 NOTE — Progress Notes (Signed)
 Complex Care Management Note Care Guide Note  04/05/2024 Name: [name] MRN: [id number] DOB: 1945-09-03   Complex Care Management Outreach Attempts: A third unsuccessful outreach was attempted today to offer the patient with information about available complex care management services.  Follow Up Plan:  No further outreach attempts will be made at this time. We have been unable to contact the patient to offer or enroll patient in complex care management services.  Encounter Outcome:  No Answer  Harlene Satterfield  [facility]  [place], [facility] Guide  Direct Dial: [phone]  Fax [phone]

## 2024-04-10 ENCOUNTER — Telehealth: Payer: Self-pay | Admitting: *Deleted

## 2024-04-10 NOTE — Progress Notes (Signed)
 Complex Care Management Note  Care Guide Note 04/10/2024 Name: [name] MRN: [id number] DOB: [birth year]  [name] is a 79 y.o. year old female who sees Okey Carlin Redbird, MD for primary care. I reached out to [name] by phone today to offer complex care management services.  [name] was given information about Complex Care Management services today including:   The Complex Care Management services include support from the care team which includes your Nurse Care Manager, Clinical Social Worker, or Pharmacist.  The Complex Care Management team is here to help remove barriers to the health concerns and goals most important to you. Complex Care Management services are voluntary, and the patient may decline or stop services at any time by request to their care team member.   Complex Care Management Consent Status: Patient agreed to services and verbal consent obtained.   Follow up plan:  Telephone appointment with complex care management team member scheduled for:  04/12/24 and 2/4 with Licensed Clinical Social Worker    Encounter Outcome:  Patient Scheduled  Harlene Satterfield  [facility]  [facility], [facility] Guide  Direct Dial: [phone]  Fax [phone]

## 2024-04-12 ENCOUNTER — Telehealth: Payer: Self-pay

## 2024-04-12 NOTE — Patient Instructions (Signed)
 Reena DELENA Mu - I am sorry I was unable to reach you today for our scheduled appointment. I work with Okey Carlin Redbird, MD and am calling to support your healthcare needs. Please contact me at [phone] at your earliest convenience. I look forward to speaking with you soon.   Thank you,  Rosaline Finlay, RN MSN   [facility] RN Care Manager Direct Dial: [phone]  Fax: [phone]

## 2024-04-13 ENCOUNTER — Ambulatory Visit: Admitting: Professional Counselor

## 2024-04-16 ENCOUNTER — Inpatient Hospital Stay

## 2024-04-18 ENCOUNTER — Ambulatory Visit (HOSPITAL_COMMUNITY)

## 2024-04-18 ENCOUNTER — Inpatient Hospital Stay

## 2024-04-21 ENCOUNTER — Ambulatory Visit (HOSPITAL_BASED_OUTPATIENT_CLINIC_OR_DEPARTMENT_OTHER)

## 2024-04-23 ENCOUNTER — Inpatient Hospital Stay: Admitting: Internal Medicine

## 2024-04-23 ENCOUNTER — Inpatient Hospital Stay

## 2024-04-25 ENCOUNTER — Encounter: Payer: Self-pay | Admitting: Licensed Clinical Social Worker

## 2024-04-25 ENCOUNTER — Telehealth: Payer: Self-pay | Admitting: Licensed Clinical Social Worker

## 2024-04-25 NOTE — Patient Instructions (Signed)
 Reena DELENA Mu - I am sorry I was unable to reach you today for our scheduled appointment. I work with Okey Carlin Redbird, MD and am calling to support your healthcare needs. Please contact me at [phone] at your earliest convenience. I look forward to speaking with you soon.   Thank you,  Cena Ligas, LCSW Clinical Social Worker [facility]

## 2024-04-26 ENCOUNTER — Telehealth: Payer: Self-pay | Admitting: *Deleted

## 2024-04-26 NOTE — Progress Notes (Unsigned)
 Complex Care Management Care Guide Note  04/26/2024 Name: [name] MRN: [id number] DOB: 1946-03-17  [name] is a 79 y.o. year old female who is a primary care patient of Okey Carlin Redbird, MD and is actively engaged with the care management team. I reached out to [name] by phone today to assist with re-scheduling  with the RN Case Manager Licensed Clinical Social Worker.  Follow up plan: Unsuccessful telephone outreach attempt made. A HIPAA compliant phone message was left for the patient providing contact information and requesting a return call.  [name]  [facility] Guide  Direct Dial: [phone]  Fax [phone]

## 2024-05-02 ENCOUNTER — Ambulatory Visit (HOSPITAL_COMMUNITY)

## 2024-05-02 ENCOUNTER — Inpatient Hospital Stay

## 2024-05-08 ENCOUNTER — Ambulatory Visit: Admitting: Professional Counselor

## 2024-05-09 ENCOUNTER — Inpatient Hospital Stay: Admitting: Internal Medicine

## 2024-05-16 ENCOUNTER — Ambulatory Visit: Admitting: Professional Counselor

## 2024-05-21 ENCOUNTER — Ambulatory Visit (HOSPITAL_BASED_OUTPATIENT_CLINIC_OR_DEPARTMENT_OTHER)

## 2024-06-11 ENCOUNTER — Ambulatory Visit: Admitting: Professional Counselor

## 2024-07-02 ENCOUNTER — Ambulatory Visit: Admitting: Professional Counselor
